# Patient Record
Sex: Female | Born: 1950 | Race: White | Hispanic: No | Marital: Married | Smoking: Former smoker
Health system: Southern US, Community
[De-identification: ages and names within clinical notes are randomized; demographics above are authoritative.]

## PROBLEM LIST (undated history)

## (undated) DIAGNOSIS — E079 Disorder of thyroid, unspecified: Secondary | ICD-10-CM

## (undated) DIAGNOSIS — Z86010 Personal history of colonic polyps: Secondary | ICD-10-CM

## (undated) DIAGNOSIS — Z9989 Dependence on other enabling machines and devices: Secondary | ICD-10-CM

## (undated) DIAGNOSIS — M1990 Unspecified osteoarthritis, unspecified site: Secondary | ICD-10-CM

## (undated) DIAGNOSIS — I10 Essential (primary) hypertension: Secondary | ICD-10-CM

## (undated) DIAGNOSIS — C801 Malignant (primary) neoplasm, unspecified: Secondary | ICD-10-CM

## (undated) DIAGNOSIS — S0340XA Sprain of jaw, unspecified side, initial encounter: Secondary | ICD-10-CM

## (undated) DIAGNOSIS — G4733 Obstructive sleep apnea (adult) (pediatric): Secondary | ICD-10-CM

## (undated) DIAGNOSIS — Z923 Personal history of irradiation: Secondary | ICD-10-CM

## (undated) DIAGNOSIS — K209 Esophagitis, unspecified without bleeding: Secondary | ICD-10-CM

## (undated) DIAGNOSIS — G4730 Sleep apnea, unspecified: Secondary | ICD-10-CM

## (undated) DIAGNOSIS — K449 Diaphragmatic hernia without obstruction or gangrene: Secondary | ICD-10-CM

## (undated) DIAGNOSIS — E039 Hypothyroidism, unspecified: Secondary | ICD-10-CM

## (undated) HISTORY — DX: Obstructive sleep apnea (adult) (pediatric): G47.33

## (undated) HISTORY — PX: COLONOSCOPY: SHX174

## (undated) HISTORY — PX: ERCP: SHX60

## (undated) HISTORY — DX: Personal history of colonic polyps: Z86.010

## (undated) HISTORY — PX: THYROID SURGERY: SHX805

## (undated) HISTORY — PX: ABDOMINAL HYSTERECTOMY: SHX81

## (undated) HISTORY — PX: CHOLECYSTECTOMY: SHX55

## (undated) HISTORY — PX: TONSILECTOMY, ADENOIDECTOMY, BILATERAL MYRINGOTOMY AND TUBES: SHX2538

## (undated) HISTORY — DX: Diaphragmatic hernia without obstruction or gangrene: K44.9

## (undated) HISTORY — DX: Unspecified osteoarthritis, unspecified site: M19.90

## (undated) HISTORY — PX: ESOPHAGOGASTRODUODENOSCOPY: SHX1529

## (undated) HISTORY — DX: Esophagitis, unspecified: K20.9

## (undated) HISTORY — DX: Sleep apnea, unspecified: G47.30

## (undated) HISTORY — DX: Dependence on other enabling machines and devices: Z99.89

## (undated) HISTORY — DX: Sprain of jaw, unspecified side, initial encounter: S03.40XA

## (undated) HISTORY — DX: Esophagitis, unspecified without bleeding: K20.90

## (undated) HISTORY — PX: BACK SURGERY: SHX140

## (undated) HISTORY — DX: Disorder of thyroid, unspecified: E07.9

---

## 1998-03-24 ENCOUNTER — Ambulatory Visit: Admission: RE | Admit: 1998-03-24 | Discharge: 1998-03-24 | Payer: Self-pay | Admitting: Otolaryngology

## 1998-04-22 ENCOUNTER — Encounter: Payer: Self-pay | Admitting: Otolaryngology

## 1998-04-24 ENCOUNTER — Observation Stay (HOSPITAL_COMMUNITY): Admission: RE | Admit: 1998-04-24 | Discharge: 1998-04-25 | Payer: Self-pay | Admitting: Otolaryngology

## 2001-12-06 ENCOUNTER — Other Ambulatory Visit: Admission: RE | Admit: 2001-12-06 | Discharge: 2001-12-06 | Payer: Self-pay | Admitting: Obstetrics and Gynecology

## 2003-02-08 ENCOUNTER — Inpatient Hospital Stay (HOSPITAL_COMMUNITY): Admission: RE | Admit: 2003-02-08 | Discharge: 2003-02-12 | Payer: Self-pay | Admitting: Orthopaedic Surgery

## 2003-04-25 ENCOUNTER — Other Ambulatory Visit: Admission: RE | Admit: 2003-04-25 | Discharge: 2003-04-25 | Payer: Self-pay | Admitting: Obstetrics and Gynecology

## 2004-11-03 ENCOUNTER — Encounter: Admission: RE | Admit: 2004-11-03 | Discharge: 2004-11-03 | Payer: Self-pay | Admitting: Family Medicine

## 2005-09-17 ENCOUNTER — Ambulatory Visit: Payer: Self-pay | Admitting: Family Medicine

## 2005-09-18 ENCOUNTER — Inpatient Hospital Stay (HOSPITAL_COMMUNITY): Admission: EM | Admit: 2005-09-18 | Discharge: 2005-09-27 | Payer: Self-pay | Admitting: Emergency Medicine

## 2005-09-22 ENCOUNTER — Ambulatory Visit: Payer: Self-pay | Admitting: Gastroenterology

## 2005-09-26 ENCOUNTER — Encounter (INDEPENDENT_AMBULATORY_CARE_PROVIDER_SITE_OTHER): Payer: Self-pay | Admitting: Specialist

## 2005-09-26 ENCOUNTER — Ambulatory Visit: Payer: Self-pay | Admitting: Internal Medicine

## 2005-10-02 ENCOUNTER — Ambulatory Visit: Payer: Self-pay | Admitting: Family Medicine

## 2005-10-21 ENCOUNTER — Ambulatory Visit: Payer: Self-pay | Admitting: Family Medicine

## 2005-12-29 ENCOUNTER — Encounter: Admission: RE | Admit: 2005-12-29 | Discharge: 2005-12-29 | Payer: Self-pay | Admitting: Orthopaedic Surgery

## 2006-02-15 ENCOUNTER — Ambulatory Visit: Payer: Self-pay | Admitting: Family Medicine

## 2006-05-05 ENCOUNTER — Ambulatory Visit: Payer: Self-pay | Admitting: Family Medicine

## 2006-05-20 ENCOUNTER — Ambulatory Visit: Payer: Self-pay | Admitting: Family Medicine

## 2006-07-08 ENCOUNTER — Ambulatory Visit: Payer: Self-pay | Admitting: Family Medicine

## 2006-10-21 ENCOUNTER — Ambulatory Visit: Payer: Self-pay | Admitting: Family Medicine

## 2006-12-07 ENCOUNTER — Ambulatory Visit: Payer: Self-pay | Admitting: Family Medicine

## 2006-12-30 ENCOUNTER — Ambulatory Visit: Payer: Self-pay | Admitting: Family Medicine

## 2007-03-01 ENCOUNTER — Ambulatory Visit: Payer: Self-pay | Admitting: Family Medicine

## 2007-05-23 ENCOUNTER — Ambulatory Visit: Payer: Self-pay | Admitting: Family Medicine

## 2007-07-14 ENCOUNTER — Ambulatory Visit: Payer: Self-pay | Admitting: Family Medicine

## 2007-11-01 ENCOUNTER — Ambulatory Visit: Payer: Self-pay | Admitting: Family Medicine

## 2008-03-14 ENCOUNTER — Ambulatory Visit: Payer: Self-pay | Admitting: Family Medicine

## 2008-03-21 ENCOUNTER — Ambulatory Visit: Payer: Self-pay | Admitting: Internal Medicine

## 2008-04-25 ENCOUNTER — Ambulatory Visit: Payer: Self-pay | Admitting: Family Medicine

## 2008-07-10 ENCOUNTER — Ambulatory Visit: Payer: Self-pay | Admitting: Family Medicine

## 2008-11-05 ENCOUNTER — Ambulatory Visit: Payer: Self-pay | Admitting: Family Medicine

## 2009-03-25 ENCOUNTER — Ambulatory Visit: Payer: Self-pay | Admitting: Obstetrics and Gynecology

## 2009-04-29 ENCOUNTER — Ambulatory Visit: Payer: Self-pay | Admitting: Family Medicine

## 2009-06-19 ENCOUNTER — Encounter: Admission: RE | Admit: 2009-06-19 | Discharge: 2009-06-19 | Payer: Self-pay

## 2009-08-29 ENCOUNTER — Ambulatory Visit: Payer: Self-pay | Admitting: Physician Assistant

## 2009-10-03 ENCOUNTER — Ambulatory Visit (HOSPITAL_BASED_OUTPATIENT_CLINIC_OR_DEPARTMENT_OTHER): Admission: RE | Admit: 2009-10-03 | Discharge: 2009-10-03 | Payer: Self-pay | Admitting: Orthopedic Surgery

## 2010-01-02 ENCOUNTER — Ambulatory Visit: Payer: Self-pay | Admitting: Family Medicine

## 2010-01-22 ENCOUNTER — Encounter: Payer: Self-pay | Admitting: Family Medicine

## 2010-01-22 LAB — CONVERTED CEMR LAB: Pap Smear: NORMAL

## 2010-01-27 ENCOUNTER — Encounter: Admission: RE | Admit: 2010-01-27 | Discharge: 2010-01-27 | Payer: Self-pay | Admitting: Orthopaedic Surgery

## 2010-06-04 ENCOUNTER — Ambulatory Visit (INDEPENDENT_AMBULATORY_CARE_PROVIDER_SITE_OTHER): Payer: Managed Care, Other (non HMO) | Admitting: Family Medicine

## 2010-06-04 ENCOUNTER — Encounter: Payer: Self-pay | Admitting: Family Medicine

## 2010-06-04 ENCOUNTER — Other Ambulatory Visit: Payer: Self-pay | Admitting: Family Medicine

## 2010-06-04 DIAGNOSIS — M1990 Unspecified osteoarthritis, unspecified site: Secondary | ICD-10-CM

## 2010-06-04 DIAGNOSIS — E1169 Type 2 diabetes mellitus with other specified complication: Secondary | ICD-10-CM | POA: Insufficient documentation

## 2010-06-04 DIAGNOSIS — J0140 Acute pansinusitis, unspecified: Secondary | ICD-10-CM | POA: Insufficient documentation

## 2010-06-04 DIAGNOSIS — E039 Hypothyroidism, unspecified: Secondary | ICD-10-CM | POA: Insufficient documentation

## 2010-06-04 DIAGNOSIS — E785 Hyperlipidemia, unspecified: Secondary | ICD-10-CM

## 2010-06-04 DIAGNOSIS — E119 Type 2 diabetes mellitus without complications: Secondary | ICD-10-CM | POA: Insufficient documentation

## 2010-06-04 DIAGNOSIS — E1165 Type 2 diabetes mellitus with hyperglycemia: Secondary | ICD-10-CM | POA: Insufficient documentation

## 2010-06-04 DIAGNOSIS — J019 Acute sinusitis, unspecified: Secondary | ICD-10-CM

## 2010-06-04 LAB — CBC WITH DIFFERENTIAL/PLATELET
Basophils Absolute: 0 10*3/uL (ref 0.0–0.1)
Eosinophils Absolute: 0.2 10*3/uL (ref 0.0–0.7)
HCT: 41.2 % (ref 36.0–46.0)
Hemoglobin: 14.2 g/dL (ref 12.0–15.0)
Lymphs Abs: 1.9 10*3/uL (ref 0.7–4.0)
MCHC: 34.4 g/dL (ref 30.0–36.0)
MCV: 93.4 fl (ref 78.0–100.0)
Neutro Abs: 5.1 10*3/uL (ref 1.4–7.7)
Neutrophils Relative %: 65.7 % (ref 43.0–77.0)
RBC: 4.41 Mil/uL (ref 3.87–5.11)
RDW: 14.1 % (ref 11.5–14.6)
WBC: 7.7 10*3/uL (ref 4.5–10.5)

## 2010-06-04 LAB — BASIC METABOLIC PANEL
BUN: 18 mg/dL (ref 6–23)
CO2: 32 mEq/L (ref 19–32)
Chloride: 101 mEq/L (ref 96–112)
Creatinine, Ser: 0.7 mg/dL (ref 0.4–1.2)
GFR: 97.12 mL/min (ref >60.00)
Glucose, Bld: 146 mg/dL — ABNORMAL HIGH (ref 70–99)
Potassium: 4.5 mEq/L (ref 3.5–5.1)
Sodium: 139 mEq/L (ref 135–145)

## 2010-06-04 LAB — LIPID PANEL
Cholesterol: 260 mg/dL — ABNORMAL HIGH (ref 0–200)
HDL: 56.5 mg/dL (ref >39.00)
Total CHOL/HDL Ratio: 5
Triglycerides: 127 mg/dL (ref 0.0–149.0)
VLDL: 25.4 mg/dL (ref 0.0–40.0)

## 2010-06-04 LAB — CONVERTED CEMR LAB
Bilirubin Urine: NEGATIVE
Blood in Urine, dipstick: NEGATIVE
Glucose, Urine, Semiquant: NEGATIVE
Ketones, urine, test strip: NEGATIVE
Nitrite: NEGATIVE
Protein, U semiquant: NEGATIVE
Specific Gravity, Urine: 1.025
WBC Urine, dipstick: NEGATIVE
pH: 5

## 2010-06-04 LAB — TSH: TSH: 91.26 u[IU]/mL — ABNORMAL HIGH (ref 0.35–5.50)

## 2010-06-04 LAB — MICROALBUMIN / CREATININE URINE RATIO
Creatinine,U: 178.7 mg/dL
Microalb, Ur: 1.1 mg/dL (ref 0.0–1.9)

## 2010-06-04 LAB — HEPATIC FUNCTION PANEL
AST: 22 U/L (ref 0–37)
Albumin: 4 g/dL (ref 3.5–5.2)
Alkaline Phosphatase: 91 U/L (ref 39–117)
Bilirubin, Direct: 0.1 mg/dL (ref 0.0–0.3)
Total Bilirubin: 0.6 mg/dL (ref 0.3–1.2)
Total Protein: 7.6 g/dL (ref 6.0–8.3)

## 2010-06-04 LAB — HEMOGLOBIN A1C: Hgb A1c MFr Bld: 8.4 % — ABNORMAL HIGH (ref 4.6–6.5)

## 2010-06-04 LAB — LDL CHOLESTEROL, DIRECT: Direct LDL: 188.4 mg/dL

## 2010-06-12 NOTE — Assessment & Plan Note (Signed)
Summary: new pt to estab---ns fee---did see Dr Ruthine Dose  [facility])///sph   Vital Signs:  Patient profile:   60 year old female Menstrual status:  hysterectomy Height:      59.75 inches Weight:      130.8 pounds BMI:     25.85 Pulse rate:   80 / minute Pulse rhythm:   regular BP sitting:   122 / 78  (left arm) Cuff size:   regular  Vitals Entered By: Almeta Monas CMA Duncan Dull) (June 04, 2010 8:25 AM) CC: New Est Care-- Fasting/wants to have labs done, URI symptoms     Menstrual Status hysterectomy Last PAP Result normal   History of Present Illness: Pt is here to establish  Type 1 diabetes mellitus follow-up      This is a 60 year old woman who presents with Type 2 diabetes mellitus follow-up.  The problem began >1 year ago.  Pt was dx with diabetes about 8 years ago.  The patient denies polyuria, polydipsia, blurred vision, self managed hypoglycemia, hypoglycemia requiring help, weight loss, weight gain, and numbness of extremities.  The patient denies the following symptoms: neuropathic pain, chest pain, vomiting, orthostatic symptoms, poor wound healing, intermittent claudication, vision loss, and foot ulcer.  Since the last visit the patient reports good dietary compliance, compliance with medications, exercising regularly, and monitoring blood glucose.  The patient has been measuring capillary blood glucose before breakfast.  Since the last visit, the patient reports having had eye care by an ophthalmologist and no foot care.    Hyperlipidemia follow-up      The patient also presents for Hyperlipidemia follow-up.  The patient denies muscle aches, GI upset, abdominal pain, flushing, itching, constipation, diarrhea, and fatigue.  The patient denies the following symptoms: chest pain/pressure, exercise intolerance, dypsnea, palpitations, syncope, and pedal edema.  Compliance with medications (by patient report) has been poor.  Dietary compliance has been good.  The patient reports  exercising 3-4X per week.  Adjunctive measures currently used by the patient include fish oil supplements.         The patient also presents with URI symptoms.  The symptoms began 2 weeks ago.  The patient complains of nasal congestion, purulent nasal discharge, dry cough, earache, and sick contacts.  The patient denies fever, low-grade fever (<100.5 degrees), fever of 100.5-103 degrees, fever of 103.1-104 degrees, fever to >104 degrees, stiff neck, dyspnea, wheezing, rash, vomiting, diarrhea, use of an antipyretic, and response to antipyretic.  The patient also reports headache.  The patient denies the following risk factors for Strep sinusitis: unilateral facial pain, unilateral nasal discharge, poor response to decongestant, double sickening, tooth pain, Strep exposure, tender adenopathy, and absence of  cough.  No OTC meds.    Preventive Screening-Counseling & Management  Alcohol-Tobacco     Smoking Status: never  Caffeine-Diet-Exercise     Does Patient Exercise: yes     Type of exercise: walking     Times/week: 5      Drug Use:  no.    Current Medications (verified): 1)  Glimepiride 2 Mg Tabs (Glimepiride) .Marland Kitchen.. 1 By Mouth Once Daily 2)  Vicodin Es 7.5-750 Mg Tabs (Hydrocodone-Acetaminophen) .Marland Kitchen.. 1 By Mouth Every 6-8 As Needed For Pain 3)  Celebrex 200 Mg Caps (Celecoxib) .Marland Kitchen.. 1 By Mouth Once Daily With Food 4)  Prozac 20 Mg Caps (Fluoxetine Hcl) .Marland Kitchen.. 1 By Mouth Once Daily 5)  Synthroid 125 Mcg Tabs (Levothyroxine Sodium) .Marland Kitchen.. 1 By Mouth Every Other Day 6)  Synthroid 112 Mcg Tabs (Levothyroxine Sodium) .Marland Kitchen.. 1 By Mouth Every Other Day 7)  Hydrochlorothiazide 25 Mg Tabs (Hydrochlorothiazide) .Marland Kitchen.. 1 By Mouth Once Daily 8)  Lisinopril 5 Mg Tabs (Lisinopril) .Marland Kitchen.. 1 By Mouth Once Daily 9)  Crestor 5 Mg Tabs (Rosuvastatin Calcium) .Marland Kitchen.. 1 By Mouth Every Other Day 10)  Ceftin 500 Mg Tabs (Cefuroxime Axetil) .Marland Kitchen.. 1 By Mouth Two Times A Day 11)  Flonase 50 Mcg/act Susp (Fluticasone Propionate)  .... 2 Sprays Each Nostril Once Daily  Allergies (verified): No Known Drug Allergies  Past History:  Family History: Last updated: 06/04/2010 Family History of Alcoholism/Addiction Family History of Stroke F 1st degree relative <60--Mother and MGM Family History of Cardiovascular disorder--MGF Family History Breast cancer 1st degree relative <50--cousin  Social History: Last updated: 06/04/2010 Occupation: [employer] Married Never Smoked Alcohol use-no Drug use-no Regular exercise-yes  Risk Factors: Exercise: yes (06/04/2010)  Risk Factors: Smoking Status: never (06/04/2010)  Past Medical History: Diabetes mellitus, type II Hyperlipidemia Hypothyroidism Osteoarthritis  Past Surgical History: Thyroidectomy Back Surgery x3 Cholecystectomy Hysterectomy Tonsillectomy  Family History: Reviewed history and no changes required. Family History of Alcoholism/Addiction Family History of Stroke F 1st degree relative <60--Mother and MGM Family History of Cardiovascular disorder--MGF Family History Breast cancer 1st degree relative <50--cousin  Social History: Reviewed history and no changes required. 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 Eyes:  Denies blurring, discharge, double vision, eye irritation, eye pain, halos, itching, light sensitivity, red eye, vision loss-1 eye, and vision loss-both eyes. ENT:  Complains of nasal congestion; denies decreased hearing, difficulty swallowing, ear discharge, earache, hoarseness, nosebleeds, postnasal drainage, ringing in ears, sinus pressure, and sore throat. CV:  Denies bluish discoloration of lips or nails, chest pain  or discomfort, difficulty breathing at night, difficulty breathing while lying down, fainting, fatigue, leg cramps with exertion, lightheadness, near fainting, palpitations, shortness of breath with exertion, swelling of feet, swelling of hands, and weight gain. Resp:  Denies chest discomfort, chest pain with inspiration, cough, coughing up blood, excessive snoring, hypersomnolence, morning headaches, pleuritic, shortness of breath, sputum productive, and wheezing. GI:  Denies abdominal pain, bloody stools, change in bowel habits, constipation, dark tarry stools, diarrhea, excessive appetite, gas, hemorrhoids, indigestion, loss of appetite, nausea, vomiting, vomiting blood, and yellowish skin color. GU:  Denies abnormal vaginal bleeding, decreased libido, discharge, dysuria, genital sores, hematuria, incontinence, nocturia, urinary frequency, and urinary hesitancy. MS:  Denies joint pain, joint redness, joint swelling, loss of strength, low back pain, mid back pain, muscle aches, muscle , cramps, muscle weakness, stiffness, and thoracic pain. Derm:  Denies changes in color of skin, changes in nail beds, dryness, excessive perspiration, flushing, hair loss, insect bite(s), itching, lesion(s), poor wound healing, and rash. Neuro:  Denies brief paralysis, difficulty with concentration, disturbances in coordination, falling down, headaches, inability to speak, memory loss, numbness, poor balance, seizures, sensation of room spinning, tingling, tremors, visual disturbances, and weakness. Psych:  Denies alternate hallucination ( auditory/visual), anxiety, depression, easily angered, easily tearful, irritability, mental problems, panic attacks, sense of great danger, suicidal thoughts/plans, thoughts of violence, unusual visions or sounds, and thoughts /plans of harming others. Endo:  Denies cold intolerance, excessive hunger, excessive thirst, excessive urination, heat  intolerance, polyuria, and weight  change. Heme:  Denies abnormal bruising, bleeding, enlarge lymph nodes, fevers, pallor, and skin discoloration. Allergy:  Denies hives or rash, itching eyes, persistent infections, seasonal allergies, and sneezing.  Physical Exam  General:  Well-developed,well-nourished,in no acute distress; alert,appropriate and cooperative throughout examination Ears:  External ear exam shows no significant lesions or deformities.  Otoscopic examination reveals clear canals, tympanic membranes are intact bilaterally without bulging, retraction, inflammation or discharge. Hearing is grossly normal bilaterally. Nose:  no external deformity, mucosal erythema, mucosal edema, L frontal sinus tenderness, L maxillary sinus tenderness, R frontal sinus tenderness, and R maxillary sinus tenderness.   Mouth:  Oral mucosa and oropharynx without lesions or exudates.  Teeth in good repair. Neck:  No deformities, masses, or tenderness noted. Lungs:  Normal respiratory effort, chest expands symmetrically. Lungs are clear to auscultation, no crackles or wheezes. Heart:  normal rate and no murmur.   Abdomen:  Bowel sounds positive,abdomen soft and non-tender without masses, organomegaly or hernias noted. Extremities:  No clubbing, cyanosis, edema, or deformity noted with normal full range of motion of all joints.   Psych:  Oriented X3 and normally interactive.    Diabetes Management Exam:    Foot Exam (with socks and/or shoes not present):       Sensory-Pinprick/Light touch:          Left medial foot (L-4): normal          Left dorsal foot (L-5): normal          Left lateral foot (S-1): normal          Right medial foot (L-4): normal          Right dorsal foot (L-5): normal          Right lateral foot (S-1): normal       Sensory-Monofilament:          Left foot: normal          Right foot: normal       Inspection:          Left foot: normal          Right foot: normal       Nails:          Left foot: normal           Right foot: thickened    Eye Exam:       Eye Exam done elsewhere          Date: 06/05/2009          Results: normal          Done by: Hyacinth Meeker   Impression & Recommendations:  Problem # 1:  HYPOTHYROIDISM (ICD-244.9)  Her updated medication list for this problem includes:    Synthroid 125 Mcg Tabs (Levothyroxine sodium) .Marland Kitchen... 1 by mouth every other day    Synthroid 112 Mcg Tabs (Levothyroxine sodium) .Marland Kitchen... 1 by mouth every other day  Orders: Venipuncture (69629) TLB-Lipid Panel (80061-LIPID) TLB-BMP (Basic Metabolic Panel-BMET) (80048-METABOL) TLB-CBC Platelet - w/Differential (85025-CBCD) TLB-Hepatic/Liver Function Pnl (80076-HEPATIC) TLB-TSH (Thyroid Stimulating Hormone) (84443-TSH) TLB-A1C / Hgb A1C (Glycohemoglobin) (83036-A1C) Specimen Handling (52841) UA Dipstick w/Micro (automated) (81001)  Problem # 2:  HYPERLIPIDEMIA (ICD-272.4)  Her updated medication list for this problem includes:    Crestor 5 Mg Tabs (Rosuvastatin calcium) .Marland Kitchen... 1 by mouth every other day  Orders: Venipuncture (32440) TLB-Lipid Panel (80061-LIPID) TLB-BMP (Basic Metabolic Panel-BMET) (80048-METABOL) TLB-CBC Platelet - w/Differential (85025-CBCD) TLB-Hepatic/Liver Function Pnl (80076-HEPATIC) TLB-TSH (Thyroid  Stimulating Hormone) (84443-TSH) TLB-A1C / Hgb A1C (Glycohemoglobin) (83036-A1C) Specimen Handling (25366)  Problem # 3:  DIABETES MELLITUS, TYPE II (ICD-250.00)  Her updated medication list for this problem includes:    Glimepiride 2 Mg Tabs (Glimepiride) .Marland Kitchen... 1 by mouth once daily    Lisinopril 5 Mg Tabs (Lisinopril) .Marland Kitchen... 1 by mouth once daily  Orders: Venipuncture (44034) TLB-Lipid Panel (80061-LIPID) TLB-BMP (Basic Metabolic Panel-BMET) (80048-METABOL) TLB-CBC Platelet - w/Differential (85025-CBCD) TLB-Hepatic/Liver Function Pnl (80076-HEPATIC) TLB-TSH (Thyroid Stimulating Hormone) (84443-TSH) TLB-A1C / Hgb A1C (Glycohemoglobin) (83036-A1C) TLB-Microalbumin/Creat Ratio,  Urine (82043-MALB) Specimen Handling (74259) UA Dipstick w/Micro (automated) (81001)  Last Eye Exam: normal (06/05/2009)  Problem # 4:  OSTEOARTHRITIS (ICD-715.90)  Her updated medication list for this problem includes:    Vicodin Es 7.5-750 Mg Tabs (Hydrocodone-acetaminophen) .Marland Kitchen... 1 by mouth every 6-8 as needed for pain    Celebrex 200 Mg Caps (Celecoxib) .Marland Kitchen... 1 by mouth once daily with food  Discussed use of medications, application of heat or cold, and exercises.   Problem # 5:  SINUSITIS - ACUTE-NOS (ICD-461.9)  Her updated medication list for this problem includes:    Ceftin 500 Mg Tabs (Cefuroxime axetil) .Marland Kitchen... 1 by mouth two times a day    Flonase 50 Mcg/act Susp (Fluticasone propionate) .Marland Kitchen... 2 sprays each nostril once daily  Instructed on treatment. Call if symptoms persist or worsen.   Complete Medication List: 1)  Glimepiride 2 Mg Tabs (Glimepiride) .Marland Kitchen.. 1 by mouth once daily 2)  Vicodin Es 7.5-750 Mg Tabs (Hydrocodone-acetaminophen) .Marland Kitchen.. 1 by mouth every 6-8 as needed for pain 3)  Celebrex 200 Mg Caps (Celecoxib) .Marland Kitchen.. 1 by mouth once daily with food 4)  Prozac 20 Mg Caps (Fluoxetine hcl) .Marland Kitchen.. 1 by mouth once daily 5)  Synthroid 125 Mcg Tabs (Levothyroxine sodium) .Marland Kitchen.. 1 by mouth every other day 6)  Synthroid 112 Mcg Tabs (Levothyroxine sodium) .Marland Kitchen.. 1 by mouth every other day 7)  Hydrochlorothiazide 25 Mg Tabs (Hydrochlorothiazide) .Marland Kitchen.. 1 by mouth once daily 8)  Lisinopril 5 Mg Tabs (Lisinopril) .Marland Kitchen.. 1 by mouth once daily 9)  Crestor 5 Mg Tabs (Rosuvastatin calcium) .Marland Kitchen.. 1 by mouth every other day 10)  Ceftin 500 Mg Tabs (Cefuroxime axetil) .Marland Kitchen.. 1 by mouth two times a day 11)  Flonase 50 Mcg/act Susp (Fluticasone propionate) .... 2 sprays each nostril once daily Prescriptions: CRESTOR 5 MG TABS (ROSUVASTATIN CALCIUM) 1 by mouth every other day  #15 x 2   Entered and Authorized by:   Loreen Freud DO   Signed by:   Loreen Freud DO on 06/04/2010   Method used:   Print  then Give to Patient   RxID:   [id number] CRESTOR 5 MG TABS (ROSUVASTATIN CALCIUM) 1 by mouth every other day  #15 x 2   Entered and Authorized by:   Loreen Freud DO   Signed by:   Loreen Freud DO on 06/04/2010   Method used:   Print then Give to Patient   RxID:   [id number] FLONASE 50 MCG/ACT SUSP (FLUTICASONE PROPIONATE) 2 sprays each nostril once daily  #1 x 1   Entered and Authorized by:   Loreen Freud DO   Signed by:   Loreen Freud DO on 06/04/2010   Method used:   Electronically to        [facility] (retail)       [address]       [place], [place]  [address]  Ph: [phone]       Fax: [phone]   RxID:   [id number] CEFTIN 500 MG TABS (CEFUROXIME AXETIL) 1 by mouth two times a day  #20 x 0   Entered and Authorized by:   Loreen Freud DO   Signed by:   Loreen Freud DO on 06/04/2010   Method used:   Electronically to        [facility] (retail)       [address]       [place], [place]  [address]       Ph: [phone]       Fax: [phone]   RxID:   [id number] CRESTOR 5 MG TABS (ROSUVASTATIN CALCIUM) 1 by mouth every other day  #15 x 0   Entered and Authorized by:   Loreen Freud DO   Signed by:   Loreen Freud DO on 06/04/2010   Method used:   Historical   RxID:   [id number] SYNTHROID 112 MCG TABS (LEVOTHYROXINE SODIUM) 1 by mouth every other day  #90 x 3   Entered and Authorized by:   Loreen Freud DO   Signed by:   Loreen Freud DO on 06/04/2010   Method used:   Electronically to        [facility] (retail)       [address]       [place], [place]  [address]       Ph: [phone]       Fax: [phone]   RxID:   [id number] HYDROCHLOROTHIAZIDE 25 MG TABS (HYDROCHLOROTHIAZIDE) 1 by mouth once daily  #90 x 3   Entered and Authorized by:   Loreen Freud DO   Signed by:   Loreen Freud DO on 06/04/2010   Method  used:   Electronically to        [facility] (retail)       [address]       [place], [place]  [address]       Ph: [phone]       Fax: [phone]   RxID:   [id number] SYNTHROID 125 MCG TABS (LEVOTHYROXINE SODIUM) 1 by mouth every other day  #90 x 3   Entered and Authorized by:   Loreen Freud DO   Signed by:   Loreen Freud DO on 06/04/2010   Method used:   Electronically to        [facility] (retail)       [address]       [place], [place]  [address]       Ph: [phone]       Fax: [phone]   RxID:   [id number] PROZAC 20 MG CAPS (FLUOXETINE HCL) 1 by mouth once daily  #90 x 3   Entered and Authorized by:   Loreen Freud DO   Signed by:   Loreen Freud DO on 06/04/2010   Method used:   Electronically to        [facility] (retail)       [address]       [place], [place]  [address]       Ph: [phone]       Fax: [phone]   RxID:  [id number] GLIMEPIRIDE 2 MG TABS (GLIMEPIRIDE) 1 by mouth once daily  #90 x 3   Entered and Authorized by:   Loreen Freud DO   Signed by:   Loreen Freud DO on 06/04/2010   Method used:   Electronically to        [facility] (retail)       [address]       [place], [place]  [address]       Ph: [phone]       Fax: [phone]   RxID:   [id number] LISINOPRIL 5 MG TABS (LISINOPRIL) 1 by mouth once daily  #30 x 2   Entered and Authorized by:   Loreen Freud DO   Signed by:   Loreen Freud DO on 06/04/2010   Method used:   Electronically to        [facility] (retail)       [address]       [place], [place]  [address]       Ph: [phone]       Fax: [phone]   RxID:   [id number] SYNTHROID 125 MCG TABS (LEVOTHYROXINE SODIUM) 1 by mouth every other day  #30 x 2   Entered and Authorized by:   Loreen Freud DO   Signed by:   Loreen Freud DO on 06/04/2010   Method used:   Electronically to        [id number] (retail)       [address]       [place], [place]  [address]       Ph: [phone] or [phone]       Fax: [phone]   RxID:   [id number]    Orders Added: 1)  Venipuncture [38101] 2)  TLB-Lipid Panel [80061-LIPID] 3)  TLB-BMP (Basic Metabolic Panel-BMET) [80048-METABOL] 4)  TLB-CBC Platelet - w/Differential [85025-CBCD] 5)  TLB-Hepatic/Liver Function Pnl [80076-HEPATIC] 6)  TLB-TSH (Thyroid Stimulating Hormone) [84443-TSH] 7)  TLB-A1C / Hgb A1C (Glycohemoglobin) [83036-A1C] 8)  TLB-Microalbumin/Creat Ratio, Urine [82043-MALB] 9)  Specimen Handling [99000] 10)  UA Dipstick w/Micro (automated) [81001] 11)  New Patient Level III [75102]   Immunization History:  Tetanus/Td Immunization History:    Tetanus/Td:  given (01/24/2007)    Tetanus/Td:  historical (01/24/2007)  Pneumovax Immunization History:    Pneumovax:  historical (01/05/2009)   Immunization History:  Tetanus/Td Immunization History:    Tetanus/Td:  Historical (01/24/2007)  Pneumovax Immunization History:    Pneumovax:  Historical (01/05/2009)   Flu Vaccine Result Date:  01/22/2010 Flu Vaccine Result:  given Flu Vaccine Next Due:  1 yr TD Result Date:  01/24/2007 TD Result:  given TD Next Due:  10 yr Colonoscopy Result Date:  08/16/2002 Colonoscopy Result:  normal Colonoscopy Next Due:  10 yr PAP Result Date:  01/22/2010 PAP Result:  normal PAP Next Due:  1 yr Mammogram Result Date:  03/20/2009 Mammogram Result:  normal Mammogram Next Due:  1 yr     Laboratory Results   Urine Tests   Date/Time Reported: June 04, 2010 11:58 AM   Routine Urinalysis   Color: yellow Appearance: Clear Glucose: negative   (Normal Range: Negative) Bilirubin: negative   (Normal Range: Negative) Ketone: negative   (Normal Range: Negative) Spec.  Gravity: 1.025   (Normal Range: 1.003-1.035) Blood: negative   (  Normal Range: Negative) pH: 5.0   (Normal Range: 5.0-8.0) Protein: negative   (Normal Range: Negative) Urobilinogen: negative   (Normal Range: 0-1) Nitrite: negative   (Normal Range: Negative) Leukocyte Esterace: negative   (Normal Range: Negative)    Comments: Floydene Flock  June 04, 2010 11:58 AM

## 2010-06-22 LAB — GLUCOSE, CAPILLARY
Glucose-Capillary: 156 mg/dL — ABNORMAL HIGH (ref 70–99)
Glucose-Capillary: 190 mg/dL — ABNORMAL HIGH (ref 70–99)

## 2010-06-22 LAB — BASIC METABOLIC PANEL
BUN: 12 mg/dL (ref 6–23)
CO2: 30 mEq/L (ref 19–32)
Calcium: 9.1 mg/dL (ref 8.4–10.5)
Chloride: 98 mEq/L (ref 96–112)
Creatinine, Ser: 0.65 mg/dL (ref 0.4–1.2)
GFR calc Af Amer: >60 mL/min (ref >60)
GFR calc non Af Amer: >60 mL/min (ref >60)
Glucose, Bld: 316 mg/dL — ABNORMAL HIGH (ref 70–99)
Potassium: 4.8 mEq/L (ref 3.5–5.1)
Sodium: 137 mEq/L (ref 135–145)

## 2010-06-22 LAB — POCT HEMOGLOBIN-HEMACUE: Hemoglobin: 12.6 g/dL (ref 12.0–15.0)

## 2010-06-23 ENCOUNTER — Ambulatory Visit (INDEPENDENT_AMBULATORY_CARE_PROVIDER_SITE_OTHER): Payer: Managed Care, Other (non HMO) | Admitting: Endocrinology

## 2010-06-23 ENCOUNTER — Encounter: Payer: Self-pay | Admitting: Endocrinology

## 2010-06-23 ENCOUNTER — Other Ambulatory Visit: Payer: Managed Care, Other (non HMO)

## 2010-06-23 ENCOUNTER — Other Ambulatory Visit: Payer: Self-pay | Admitting: Endocrinology

## 2010-06-23 DIAGNOSIS — E039 Hypothyroidism, unspecified: Secondary | ICD-10-CM

## 2010-06-23 LAB — TSH: TSH: 2.68 u[IU]/mL (ref 0.35–5.50)

## 2010-07-03 NOTE — Assessment & Plan Note (Signed)
Summary: NEW ENDO [employer] HYPOTHY PT-ALICIA/DR LOWNE-PKG-#--STC   Vital Signs:  Patient profile:   60 year old female Menstrual status:  hysterectomy Height:      59.75 inches (151.77 cm) Weight:      130 pounds (59.09 kg) BMI:     25.69 O2 Sat:      97 % on Room air Temp:     98.1 degrees F (36.72 degrees C) oral Pulse rate:   89 / minute Pulse rhythm:   regular BP sitting:   118 / 78  (left arm) Cuff size:   regular  Vitals Entered By: Brenton Grills CMA Duncan Dull) (June 23, 2010 9:45 AM)  O2 Flow:  Room air CC: New Endo Consult/Hypothyroidism/Dr. Lowne/aj Is Patient Diabetic? Yes   Referring Provider:  Loreen Freud DO Primary Provider:  Laury Axon   CC:  New Endo Consult/Hypothyroidism/Dr. Lowne/aj.  History of Present Illness: at age 67, pt had resection of a goiter (benign).  since then, she has been on synthroid.  she says she felt well on synthroid 150 or 175 micrograms/day.  it was reduced 1 year ago, due to an abnormal blood test.  she says she takes synthroid exactly as prescribed.  she now says few mos of moderate bilat shoulder pain, and assoc fatigue.    Current Medications (verified): 1)  Glimepiride 2 Mg Tabs (Glimepiride) .Marland Kitchen.. 1 By Mouth Once Daily 2)  Vicodin Es 7.5-750 Mg Tabs (Hydrocodone-Acetaminophen) .Marland Kitchen.. 1 By Mouth Every 6-8 As Needed For Pain 3)  Celebrex 200 Mg Caps (Celecoxib) .Marland Kitchen.. 1 By Mouth Once Daily With Food 4)  Prozac 20 Mg Caps (Fluoxetine Hcl) .Marland Kitchen.. 1 By Mouth Once Daily 5)  Synthroid 125 Mcg Tabs (Levothyroxine Sodium) .Marland Kitchen.. 1 By Mouth Every Other Day 6)  Synthroid 112 Mcg Tabs (Levothyroxine Sodium) .Marland Kitchen.. 1 By Mouth Every Other Day 7)  Hydrochlorothiazide 25 Mg Tabs (Hydrochlorothiazide) .Marland Kitchen.. 1 By Mouth Once Daily 8)  Lisinopril 5 Mg Tabs (Lisinopril) .Marland Kitchen.. 1 By Mouth Once Daily 9)  Crestor 5 Mg Tabs (Rosuvastatin Calcium) .Marland Kitchen.. 1 By Mouth Every Other Day 10)  Flonase 50 Mcg/act Susp (Fluticasone Propionate) .... 2 Sprays Each Nostril Once  Daily  Allergies (verified): No Known Drug Allergies  Family History: Reviewed history from 06/04/2010 and no changes required. Family History of Alcoholism/Addiction Family History of Stroke F 1st degree relative <60--Mother and MGM Family History of Cardiovascular disorder--MGF Family History Breast cancer 1st degree relative <50--cousin no thyroid probs  Social History: Reviewed history from 06/04/2010 and no changes required. Occupation: Insurance account manager parts Married Alcohol use-no Drug use-no Regular exercise-yes Former Smoker Smoking Status:  quit  Review of Systems       denies depression, hair loss, cramps, sob, memory loss, constipation, numbness, polyuria, blurry vision, myalgias,  and syncope.  he has lost weight, due to her efforts.  she reports dry skin, rhinorrhea, and easy bruising.    Physical Exam  General:  obese.  no distress  Head:  head: no deformity eyes: no periorbital swelling, no proptosis external nose and ears are normal mouth: no lesion seen Neck:  a healed scar is present.  i do not appreciate a nodule in the thyroid or elsewhere in the neck  Lungs:  Clear to auscultation bilaterally. Normal respiratory effort.  Heart:  Regular rate and rhythm without murmurs or gallops noted. Normal S1,S2.   Msk:  muscle bulk and strength are grossly normal.  no obvious joint swelling.  gait is normal and steady rom of the  shoulders is limited by pain Pulses:  dorsalis pedis intact bilat.   Extremities:  no deformity.   no edema  Neurologic:  cn 2-12 grossly intact.   readily moves all 4's.   sensation is intact to touch on the feet  Skin:  normal texture and temp.  no rash.  not diaphoretic  Cervical Nodes:  No significant adenopathy.  Psych:  Alert and cooperative; normal mood and affect; normal attention span and concentration.   Additional Exam:  FastTSH                   2.68 uIU/mL     Impression & Recommendations:  Problem # 1:  HYPOTHYROIDISM  (ICD-244.9) well-replaced  Problem # 2:  dry skin could be related to #1  Problem # 3:  weight loss not related to #1  Other Orders: TLB-TSH (Thyroid Stimulating Hormone) (84443-TSH) Consultation Level IV (25956)  Patient Instructions: 1)  blood tests are being ordered for you today.  please call [phone] to hear your test results. 2)  pending the test results, please continue the same medications for now. 3)  our goal will be to adjust your medication to normalize your blood test.   4)  (update: i left message on phone-tree:  rx as we discussed)   Orders Added: 1)  TLB-TSH (Thyroid Stimulating Hormone) [84443-TSH] 2)  Consultation Level IV [32951]

## 2010-08-22 NOTE — H&P (Signed)
NAMEARIELLE, [name]                 ACCOUNT NO.:  [id number]   MEDICAL RECORD NO.:  [id number]          PATIENT TYPE:  EMS   LOCATION:  ED                           FACILITY:  [facility]   PHYSICIAN:  Barbette Hair. Arlyce Dice, M.D. [facility] OF BIRTH:  Jan 30, 1951   DATE OF ADMISSION:  09/18/2005  DATE OF DISCHARGE:                                HISTORY & PHYSICAL   CHIEF COMPLAINT:  Right upper quadrant pain.   HISTORY OF PRESENT ILLNESS:  [name] is a pleasant 60 year old white  female who generally does not have GI complaints.  Starting Monday, which is  5 days ago,  the patient developed pain in the right upper quadrant.  She  lived with it for 3 days and then went to Dr. Jola Babinski office yesterday.  He also scheduled her for a CT scan today.  Significant outcomes of this, is  the fact that the abdominal pelvic CT with contrast shows dilation of the  common bile duct to the level of the small bowel suggesting a possible  stricture.  No choledocholithiasis, no intrahepatic biliary obstruction, or  pancreatic, or small-bowel masses were seen.  Her metabolic profile was  unremarkable, in so far, as the LFTs were normal.  However, her glucose was  elevated at 144; she is a diabetic.  When she was seen in the office,  yesterday, Dr. Susann Givens started her on Nexium.  She has only taken 1 dose of  this and her symptoms persist.  After seeing the results of the CT scan Dr.  Jola Babinski office got in contact with Dr. Melvia Heaps for further guidance.  She has been advised; and is now in the emergency room at [facility].  Our plan is to admit her for further study and symptom control.   CURRENT MEDICATIONS:  1.  Avandia 4 mg once daily.  2.  Metoprolol 40 mg once daily.  3.  Synthroid 200 mcg once daily.  4.  Lipitor 20 mg once daily.   ALLERGIES:  No specific allergies, but she has had anxiety reaction to  multiple antihistamines.   PAST MEDICAL HISTORY:  1.  Diabetes mellitus type 2.  2.  Sinusitis.  3.  Hypertension.  4.  Hyperlipidemia.  5.  She has hypothyroidism after having had a thyroidectomy to manage      hyperthyroidism.  6.  Status post surgeries on the low back x3.  7.  She denies any abdominal pain currently.  8.  Status post partial hysterectomy her ovaries are intact.  9.  Status post a screening colonoscopy with no polyps found according to      the patient.  This was in about year 2000 by Dr. Arlyce Dice.   SOCIAL HISTORY:  The patient is married, she works delivering Research scientist (life sciences) parts.  She smokes 1/2-pack-per-day.  She does not consume alcoholic beverages.   REVIEW OF SYSTEMS:  As above, additionally, her weight is stable.  She does  not have reflux symptoms.  No change in bowel habits.  No sweats, but did  have rigors last night, no excessive thirst.  No unusual bleeding or  bruising.  The patient states she does not get chest pain or extremity  edema; and that she had a stress test about 5 years ago that was negative.  This was supervised by Dr. Elease Hashimoto.   LABS:  Hemoglobin A1c is 6.4.  TSH is 2.433.  Sodium 140, potassium 4.4,  chloride 102, CO2 27, BUN 9, creatinine 0.8, glucose 144.  Hemoglobin 14,  hematocrit 44.8, white blood cell count 9.8, platelets 235,000 and MCV is  96.  Amylase 51, lipase 22, total bilirubin 0.5, alkaline phosphatase 100,  AST 14, ALT 15, albumin is 4.3.   PHYSICAL EXAMINATION:  GENERAL:  The patient is a pleasant white female.  She looks a little bit older than her biological age, having had what looks  like a lot of sun exposure and a lifelong of smoking that has weathered her  skin.  VITAL SIGNS:  Blood pressure 122/85, pulse 96, respirations 20, temperature  is 98.6.  She is not in any acute pain, currently.  She is alert and  oriented x3.  HEENT EXAM:  No pallor.  Extraocular movements are intact.  No jaundice.  Mucous membranes are moist and clear.  NECK:  There are no masses, no JVD or bruits.  There is a fine  well-healed  scar at the base of the neck.  ABDOMEN: Notable for tenderness in the right upper quadrant, but no guarding  or rebound with this.  No hepatosplenomegaly, no masses, no bruits.  RECTAL, GENITOURINARY AND BREAST EXAMS:  Deferred.  EXTREMITIES:  No cyanosis, clubbing or edema.  NEUROPSYCHIATRIC:  The patient is alert and oriented x3.  She is pleasant.  She provides a good history.  No tremors.  She is able to move all four  limbs without difficulty; and walks from the triage area into the emergency  room, proper, without assistance   IMPRESSION:  1.  Dilated bile duct associated with right upper quadrant pain, now, for 4-      5 days.  LFTs are normal.  Gallbladder is intact.  There is no evidence      for any gallbladder stones, or pancreatic, or duodenal masses on the CT      scan study.  2.  Diabetes mellitus type 2.  Note, the hemoglobin A1c was slightly      elevated when obtained yesterday.  3.  Sinusitis.  Recent course of Biaxin to treat to a flare of this.   PLAN:  Admit patient for IV fluids, pain control.  Because she is not  throwing up, we are going to let her have solid food for dinner, but she  will be n.p.o. after midnight to rule out for ERCP tomorrow.  Plan to get an  ultrasound of the abdomen, possibly tonight, but it may have to wait until  tomorrow morning.  Plan to control symptoms with analgesics and antiemetics  as needed, and plan to start IV Cipro and Flagyl to cover for possible  cholangitis.      Jennye Moccasin, P.A. [facility]      Robert D. Arlyce Dice, M.D. [facility]  Electronically Signed    SG/MEDQ  D:  09/18/2005  T:  09/18/2005  Job:  [id number]

## 2010-08-22 NOTE — Discharge Summary (Signed)
NAMELASHANN, [name]                 ACCOUNT NO.:  [id number]   MEDICAL RECORD NO.:  [id number]          PATIENT TYPE:  INP   LOCATION:  1511                         FACILITY:  [facility]   PHYSICIAN:  Malcolm T. Russella Dar, M.D. [facility] OF BIRTH:  [birth year]   DATE OF ADMISSION:  09/18/2005  DATE OF DISCHARGE:  09/27/2005                                 DISCHARGE SUMMARY   ADMITTING DIAGNOSES:  51.  A 60 year old female admitted with right upper quadrant pain with      dilated common bile duct, normal LFTs and no evidence for gallstones,      rule out common bile duct stone, cholecystitis, peptic ulcer disease.  2.  Adult onset diabetes mellitus.  3.  Sinusitis with recent antibiotics.  4.  Hypothyroidism.  5.  Status post hysterectomy.   DISCHARGE DIAGNOSES:  1.  Stable status post endoscopic retrograde cholangiopancreatography and      sphincterotomy. No evidence for stone on endoscopic retrograde      cholangiopancreatography and no change in pain post sphincterotomy.  2.  Cholecystitis stable status post laparoscopic cholecystectomy and      intraoperative cholangiogram September 26, 2005 with normal cholangiogram.  3.  Atypical pneumonia.  4.  Obstructive sleep apnea with nocturnal hypoxia.  5.  Smoker.  6.  Abnormal CT chest with several mediastinal nodes not technically      enlarged but follow-up recommended and bilateral ground glass air space      opacities right greater than left consistent with an atypical pneumonia.      Follow-up CT of the chest recommended in 4-6 weeks.   CONSULTATIONS:  1.  Surgery, Dr. Johna Sheriff.  2.  Internal medicine, Dr. Artist Pais.   PROCEDURES:  ERCP and sphincterotomy, laparoscopic cholecystectomy with IOC.   BRIEF HISTORY:  [name] is a 60 year old white female with no known chronic  GI problems.  She started about 5 days prior to admission with right upper  quadrant pain.  She was seen by Dr. Susann Givens the day prior to admission and  then scheduled for a CT  scan. CT shows dilation of the common bile duct to  the level of the small bowel suggesting a possible stricture.  There was no  evidence of choledocholithiasis or cholelithiasis.  She was started on  Nexium per Dr. Susann Givens.  After reviewing the CT scan, Dr. Susann Givens contacted  Dr. Arlyce Dice and  admission was advised for further diagnostic workup and ERCP  to rule out common bile duct stone.   LABORATORY STUDIES:  On admission June 16 WBC of 8.9, hemoglobin 13,  hematocrit of 38.4, MCV of 93.  Pro time 13.3, INR of 1.0.  Electrolytes  within normal limits.  Glucose 80, creatinine 0.8, albumin 3.2.  LFTs  completely normal.  Amylase 79 and lipase 27. CA19-9, 17.9.  Serial CBCs  were obtained on June 24, WBC was 9.4, hemoglobin 11.4, hematocrit of 33.8.  Follow-up LFTs on June 20 remained completely normal. UA showed many  epithelials otherwise negative.  Blood cultures negative x2.  AFB smear  pending. Urine for Legionella negative.  Respiratory  cultures from June 21  normal oral pharyngeal flora.  Sputum culture was obtained on June 21 and  was still pending final on the day of discharge.   X-RAY STUDIES:  HIDA scan on September 20, 2005 showed a distal common bile duct  obstruction, no evidence of cystic duct obstruction but the gallbladder  ejection fraction was abnormal with 0% emptying. Chest x-ray on June 20  question interval development of nodular density in the left apex. Chest CT  was recommended, question pulmonary arterial hypertension.  Chest CT September 24, 2005 showed no evidence of the left upper lobe nodule but there were  bilateral ground glass air space opacities in both upper lobes, right  greater than left consistent with a pneumonia. Several mediastinal nodes not  technically enlarged but requiring follow-up and follow-up CT was  recommended in several weeks. Gallbladder wall edema or pericholecystic  fluid incidentally noted, question acute cholecystitis. Sinus films on June   22 showed some diffuse mucosal thickening of the paranasal sinuses, no air-  fluid levels.   HOSPITAL COURSE:  The patient was admitted to the service of Dr. Lina Sar  who was covering the hospital, started on IV Cipro and IV Flagyl and  scheduled for ERCP the following day. This was done per Dr. Arlyce Dice and was a  normal exam. HIDA scan was then obtained which did show a ejection fraction  of zero, no evidence of cystic duct obstruction.  She also underwent upper  endoscopy on June 18 because of the complaints of pain and was found to have  a small hiatal hernia and esophagitis, otherwise normal exam.  A surgical  consultation was obtained.  It was felt that her symptoms were consistent  with biliary colic, but with negative workup.  This was not easy to prove.  It was felt that she may benefit from cholecystectomy.  She had the repeat  HIDA scan with results as outlined above.  She also developed a fever on  June 19.  With persistent normal LFTs, surgery requested preop  sphincterotomy prior to deciding on cholecystectomy and this was performed  by Dr. Juanda Chance on June 21. She did require an epinephrine injection as well  with hemostasis.  The patient did not have any change in her right upper  quadrant pain post sphincterotomy.  She, that evening, had developed a  temperature and had an abnormal chest x-ray. Medicine was consulted, she was  seen by Dr. Artist Pais and felt to have atypical pneumonia.  Sputum cultures were  obtained, urine for Legionella, etc. She was started on Levaquin. She was  also noted to have some nocturnal hypoxemia and had a prior diagnosis of  obstructive sleep apnea. Dr. Artist Pais recommended nocturnal oxygen and then  follow up as an outpatient for a sleep study and then CPAP titration.  Because of the abnormal x-ray of the chest, CT scan of the chest was  obtained with findings as outlined above. This also showed what appeared to be an edematous gallbladder. With that  information it was decided to proceed  with laparoscopic cholecystectomy which was done on June 23 per Dr.  Johna Sheriff. In fact, she was found to have cholecystitis with significant  inflammation and thickening of the gallbladder and a normal cholangiogram.  By June 24, she was feeling much better.  Her pain, nausea and vomiting had  subsided.  She was no longer febrile. Her diet was advanced, she was  converted to oral Avelox and on June 24  felt stable for discharge to home.  She was to follow up with Dr. Johna Sheriff in his office in 7-10 days, to stay  out of work until follow up with Dr. Johna Sheriff. She was discharged on home O2  q.h.s. 2 liters and was to follow up with her primary care physician, Dr.  Meredeth Ide, regarding obtaining formal sleep study and then fitting for CPAP.   MEDICATIONS ON DISCHARGE:  1.  Avelox 400 mg daily x 7 days,.  2.  Vicodin 5/500 q.6 h as needed.   Other usual medicines:  1.  Avandia 4 mg once daily.  2.  Metoprolol 40 mg once daily.  3.  Synthroid 200 mcg daily.  4.  Lipitor 20 q.d.   She was to follow up with Dr. Meredeth Ide in approximately 2 weeks and also will  need follow-up CT of the chest in approximately 4 weeks to be arranged per  Dr. Meredeth Ide.      Amy Esterwood, P.A.-C. [facility]      Malcolm T. Russella Dar, M.D. [facility]  Electronically Signed    AE/MEDQ  D:  09/28/2005  T:  09/28/2005  Job:  [id number]   cc:   Lorne Skeens. Hoxworth, M.D.  [address], [address]  [place]  [place] [address]   Dr. Meredeth Ide

## 2010-08-22 NOTE — Consult Note (Signed)
[name], [name]                 ACCOUNT NO.:  [id number]   MEDICAL RECORD NO.:  [id number]          PATIENT TYPE:  INP   LOCATION:  1511                         [facility]AC[facility]   PHYSICIAN:  Lebron Conners, M.D.   DATE OF BIRTH:  08-17-1950   DATE OF CONSULTATION:  09/21/2005  DATE OF DISCHARGE:                                   CONSULTATION   CHIEF COMPLAINT:  Abdominal pain.   PRESENT ILLNESS:  The patient is a 60 year old diabetic white female who has  no gastrointestinal diagnoses but has had intermittent sharp and sometimes  severe right upper quadrant abdominal pain often associated with eating,  often associated with nausea when it occurs, and sometimes making it  difficult for her move around.  She remembers no trauma, twisting or cough  or other injury that might have caused this.  She has been worked up for  this problem with ultrasounds which are normal except for a mildly enlarged  common bile duct of about 1 cm.  About a year ago she had a hepatobiliary  scan, which showed good filling of the gallbladder, some slight delay in  emptying but complete emptying of the gallbladder after 45 minutes.  It was  interpreted as normal.  This pain has continued to occur this despite being  on Nexium.  Her liver enzyme tests and bilirubin, amylase, lipase, CBC are  all normal.  CT of the abdomen shows no evidence of any other liver or intra-  abdominal problem, did confirm mild dilatation of the common bile duct.  Dr.  Juanda Chance did endoscopy demonstrating no abnormalities except for a small  hiatal hernia.  Dr. Arlyce Dice did ERCP confirming slight bile duct enlargement  but finding no stones or evidence of stricture.  I am consulted for  discussion of cholecystectomy because of the suggestion that this pain may  be due to biliary dyskinesia.   PAST MEDICAL HISTORY:  The patient has high blood pressure, hypothyroidism,  hyperlipidemia, type 2 diabetes.   MEDICATIONS:  1.  Avandia 4  mg daily  2.  Metoprolol 40 mg daily.  3.  Synthroid 200 mcg daily/  4.  Lipitor 20 mg daily.   No medication allergies.   FAMILY HISTORY:  Childhood illnesses, unremarkable.   The patient smokes about a half pack cigarettes a day.  She does not drink  alcoholic beverages.   SURGICAL:  She has had a hysterectomy.  She had had a thyroidectomy because  she was hyperthyroid and now has to have replacement.  She has had 3 back  surgeries.   REVIEW OF SYSTEMS:  No chest pain, no shortness of breath, no diarrhea, no  vomiting except after she was admitted to the hospital.  Remainder  unremarkable.   PHYSICAL EXAMINATION:  VITAL SIGNS:  Temperature and vital signs were normal  as recorded several times in the hospital record.  GENERAL:  Patient is in no acute distress.  Mental status normal.  She is  tearful and talkative.  HEAD AND NECK:  Eyes, ears, nose and throat unremarkable.  Anterior collar  incision.  No palpable thyroid.  No neck mass.  No icterus.  CHEST:  Clear to auscultation.  HEART:  Rate and rhythm normal, no murmur or gallop.  ABDOMEN:  Tender in the right upper quadrant without spasm or rebound,  otherwise unremarkable.  No mass, no hernia.  Bowel sounds normal.  EXTREMITIES:  Normal, no edema.  Pulses are normal.  SKIN:  No lesions are noted.  LYMPH NODES:  Not enlarged in the groin or neck.   IMPRESSION:  1.  Abdominal pain of unknown etiology, possible biliary dyskinesia.  2.  Type 2 diabetes.  3.  High blood pressure.  4.  Hyperlipidemia.   RECOMMENDATIONS:  I agree with the plan for repeat of the hepatobiliary  scan.  With or without positive results from that, the patient might benefit  from cholecystectomy.  I discussed that option with her at considerable  length.  I made it  of made clear to her that there would be no guarantee of  relief of pain without establishment of a more definite diagnosis of biliary  disease.  She understands that, and we will  discuss this further later.  We  might go with surgery later in the week, or this might be electively  scheduled if she elects to have surgery.      Lebron Conners, M.D.  Electronically Signed     WB/MEDQ  D:  09/21/2005  T:  09/22/2005  Job:  [id number]   cc:   Sharlot Gowda, M.D.  Fax: [phone]   Lina Sar, M.D. [facility]  [address]  [place]  [place] [address]

## 2010-08-22 NOTE — Op Note (Signed)
NAME:  [name], [name]                           ACCOUNT NO.:  [id number]   MEDICAL RECORD NO.:  [id number]                   PATIENT TYPE:  INP   LOCATION:  2899                                 FACILITY:  [facility]   PHYSICIAN:  Sharolyn Douglas, M.D.                     DATE OF BIRTH:  September 14, 1950   DATE OF PROCEDURE:  02/08/2003  DATE OF DISCHARGE:                                 OPERATIVE REPORT   PREOPERATIVE DIAGNOSIS:  L5-S1 degenerative disk disease and recurrent  herniated nucleus pulposus, status post laminotomy.   POSTOPERATIVE DIAGNOSIS:  L5-S1 degenerative disk disease and recurrent  herniated nucleus pulposus, status post laminotomy.   PROCEDURE:  1. Revision L5-S1 laminotomy with decompression of the left L5 and S1 nerve     roots.  2. Transforaminal lumbar interbody fusion L5-S1 with placement of a PEAK     prosthetic cage.  3. Posterior spinal arthrodesis, L5-S1.  4. Local autogenous bone graft.  5. Pedicle screw instrumentation L5-S1 utilizing the [employer]     Encompass System.  6. Neural monitoring using free running and triggered EMGs.   SURGEON:  Sharolyn Douglas, M.D.   ASSISTANT:  Verlin Fester, P.A.   ANESTHESIA:  General endotracheal anesthesia.   COMPLICATIONS:  None.   INDICATIONS FOR PROCEDURE:  The patient is a 60 year old female with  progressively worsening back and left lower extremity pain. She is status  post L5-S1 laminotomy and diskectomy. Her planned radiographs show severe  disk space narrowing at L5-S1. An MRI scan shows recurrent disk  herniation  at L5-S1 with epidural fibrosis. She has been refractory to all conservative  treatment options. Her symptoms have been worsening. She elected to undergo  revision, decompression and fusion at L5-S1 in hopes of improving  her  symptoms. The  risks, benefits and alternatives were extensively reviewed  with her.   DESCRIPTION OF PROCEDURE:  The patient was properly identified  in the  holding area and  taken to the operating room. She underwent general  endotracheal anesthesia without difficulty. She was given prophylactic IV  antibiotics. The EMG leads were placed for monitoring of free running and  triggered EMGs using the invasive NeuroVision system. She was carefully  turned prone onto the Wilson frame. All bony prominences were padded. The  patient's eyes were protected at all times. The back was prepped and draped  in the usual sterile fashion.   The previous 10-cm midline incision was utilized. She was noted to have a  very high lumbosacral angle. The sacrum was almost vertical. We carefully  worked our way through the previous scar tissue, identifying the spinous  processes of L4-5 and S1. We identified  the transverse processes of L5  bilaterally  as well as the sacral ala. The previous laminotomy was  visualized at L5-S1 on the left side. There was a significant amount of  epidural  fibrosis noted. We placed a deep retractor.   We then used a curet to dissect the epidural fibrosis from the laminotomy  edges. A facetectomy was carried out on the left. We identified  the S1  nerve root which was encased in scar tissue and adherent to the underlying  disk space. The L5 nerve root was being compressed within the L5-S1 foramen.  A complete pedicle-to-pedicle decompression was carried out.   We then gently mobilized the  thecal sac medially. The disk space was  identified using fluoroscopy. It was completely collapsed. We were able to  enter the disk space using an osteotome. Posterior osteophytes were removed.  We then used dilators to  sequentially elevate the disk space up to 9 mm. We  used specialized TLIF instruments to perform a radical diskectomy across the  contralateral side. We scraped the cartilaginous endplates. There was very  little disk  material within the interspace.   We then placed morselized bone that had been harvested from the facetectomy  and cleaned the  soft tissue into the interspace. We also placed a BNP  sponge from a small  infuse kit anteriorly  and across the midline. We then  placed an 18-mm invasive PEAK cage into the interspace which had been filled  with the remaining BNP  sponge from the small  kit. This was carefully  tamped anterior and across the midline. We confirmed good positioning with  fluoroscopy.   We then turned our attention to performing a posterolateral arthrodesis at  L5 and S1. A high-speed bur was used to remove the cortical bone from the L5  transverse processes and sacral ala bilaterally. We packed the remaining  local autologous bone graft into the lateral  gutters. We then turned our  attention to placing pedicle screws at L5 and S1 using anatomic landmarks as  well as direct palpation of the pedicles from within the canal. Fluoroscopy  was also utilized to place the screws.   We used 6.5 x 45 mm screws at L4 and 7.5 x 30 mm screws in the sacrum. We  had good screw purchase. We applied gentle compression across the interspace  before locking the titanium rods. Final tightening was carried out. We then  reevaluated the dural sac and the left L5 and S1 nerve roots. There was a  small  area where the epidural fibrosis had been dissected on the S1 nerve  root sleeve where the arachnoid was visible. A Valsalva maneuver showed no  CSF leak.   We therefore elected to reinforce the area with Tisseel fibrin sealant.  Gelfoam was then left over the epidural space. The wound was copiously  irrigated. We placed a deep Hemovac drain.   The deep fascia was closed with a running #1 Vicryl. The subcutaneous layer  was closed with 0 Vicryl and 2-0 Vicryl followed by 3-0 nylon in a running  fashion to approximate the skin edges. A sterile dressing was applied.   The patient was turned supine, extubated without difficulty and transferred to the recovery room in stable condition. It should be noted that after each   pedicle screw was placed, we utilized triggered EMGs. We had a response at  greater than 20 milliamps consistent with interosseous placement  of the  screws. In addition we monitored free running EMGs throughout the procedure  including the TLIF and there were no deleterious changes.  Sharolyn Douglas, M.D.    MC/MEDQ  D:  02/08/2003  T:  02/08/2003  Job:  [id number]

## 2010-08-22 NOTE — Discharge Summary (Signed)
NAME:  [name], [name]                           ACCOUNT NO.:  [id number]   MEDICAL RECORD NO.:  [id number]                   PATIENT TYPE:  INP   LOCATION:  5023                                 FACILITY:  [facility]   PHYSICIAN:  Sharolyn Douglas, M.D.                     DATE OF BIRTH:  [birth year]   DATE OF ADMISSION:  02/08/2003  DATE OF DISCHARGE:  02/12/2003                                 DISCHARGE SUMMARY   ADMISSION DIAGNOSES:  1. L5-S1 herniated nucleus pulposus and degenerative joint disease.  2. Mitral valve prolapse.  3. Diabetes.  4. Hyperparathyroidism.  5. Gastroesophageal reflux disease.   DISCHARGE DIAGNOSES:  1. Status post L5-S1 post-spinal fusion, doing well.  2. Postoperative hemorrhagic anemia which was very mild and completely     asymptomatic not requiring a blood transfusion.  3. Mitral valve prolapse.  4. Diabetes.  5. Hyperparathyroidism.  6. Gastroesophageal reflux disease.   PROCEDURE:  L5-S2 post-spinal fusion with pedicle screws and VFP and  ________.  This was done at [facility], Surgeon Dr. Sharolyn Douglas, assistant  [facility]  Anesthesia general.   CONSULTATIONS:  None.   LABORATORY DATA:  CBC with differential preoperatively was within normal  limits with a hemoglobin of 15.4.  H&H were monitored three days  postoperatively which Hemoglobin 12.5, hematocrit 33.9 respectively and she  was asymptomatic and did not require transfusion. These were 12.4 and 35.4  on the following day.  PT, INR was followed.  PTT was 25 preoperatively.  A  complete metabolic panel preoperatively revealed a glucose of 139.  Basic  metabolic panel postoperatively was normal with exception of glucose of 133.  Urinalysis from February 05, 2003 was negative.  Blood type B, Rh type  positive, antibody screen negative.   EKG from February 05, 2003 showed normal sinus rhythm, nonspecific T wave  abnormality confirmed.  X-rays from February 12, 2003 of the lumbar spine  showed  posterior fusion at L5-S1.  A portable x-ray was used  intraoperatively to confirm position.   BRIEF HISTORY:  The patient is a 60 year old female who has been having back  and left lower extremity pain since August, 2004.  The pain is severe in  nature and has been getting progressively worse, with failed conservative  management.  It has gotten to the point of severity limiting her quality of  life and activities of daily living secondary to severe these severe  findings as well as her MRI it was felt the best course of management would  be operative intervention.  The risks and benefits were discussed with the  patient of this were discussed with the patient by Dr. Noel Gerold as well as  myself.  She indicated her understanding and opted to proceed.   HOSPITAL COURSE:  On February 08, 2003 the patient was taken to the operating  room for the above  listed procedure.  She tolerated the procedure well  without any intraoperative complications.  Blood loss was minimal.  A  Hemovac drain was placed intraoperatively and she was transferred to the  recovery room in stable condition.  Postoperatively we orthopedic spine  protocol was followed.  She did lie flat x24 hours secondary to a small  subdural tear that was repaired and reinforced intraoperatively.  She began  progressively getting up on postoperative day one, progressively sitting up  in her bed to the point that she was eating upright.  Got up to a chair and  ambulated with therapy later that day.  She did very well and did not  develop any headaches, nausea or vomiting or problems.  Her Foley was  discontinued on February 10, 2003.  She progressed well with physical  therapy.  By February 12, 2003 the patient had met all our orthopedic goals,  she was medically stable and ready for discharge home.  She was feeling  well.   Care management has been consulted to assist [place] with her home needs as well  as Home Health needs.  This was set up  through [facility].   DISCHARGE PLAN:  The patient is a 60 year old female status post spinal  fusion at L5-S1, doing well.   ACTIVITY:  Daily ambulation, brace on when up.  She may shower.  No lifting  greater than five pounds.  Back precautions were discussed and understood.  She is to follow up two weeks postoperatively with Dr. Noel Gerold.   DISCHARGE MEDICATIONS:  Percocet and Robaxin, multivitamins, calcium and  over-the-counter laxative p.r.n.   DIET:  1800 calorie ADA diet is recommended.   CONDITION ON DISCHARGE:  Stable and improved.   DISPOSITION:  The patient is discharged to her home with Home Health  physical therapy and occupational therapy as well as her family for  assistance.      Verlin Fester, P.A.                       Sharolyn Douglas, M.D.    CM/MEDQ  D:  04/04/2003  T:  04/04/2003  Job:  [id number]

## 2010-08-22 NOTE — Op Note (Signed)
NAMEFALLYN, MUNNERLYN                 ACCOUNT NO.:  [id number]   MEDICAL RECORD NO.:  [id number]          PATIENT TYPE:  INP   LOCATION:  1511                         FACILITY:  [facility]   PHYSICIAN:  Sharlet Salina T. Hoxworth, M.D.DATE OF BIRTH:  1951-02-20   DATE OF PROCEDURE:  09/26/2005  DATE OF DISCHARGE:                                 OPERATIVE REPORT   PREOPERATIVE DIAGNOSIS:  Cholecystitis.   POSTOPERATIVE DIAGNOSIS:  Cholecystitis.   SURGICAL PROCEDURES:  Laparoscopic cholecystectomy and operative  cholangiogram.   SURGEON:  Sharlet Salina T. Hoxworth, M.D.   ASSISTANT:  Alfonse Ras, M.D.   ANESTHESIA:  General.   BRIEF HISTORY:  Ms. Shutter is a 60 year old diabetic female who presents with  persistent right upper quadrant abdominal pain.  She has had extensive an  workup including a gallbladder ultrasound that was negative.  A HIDA scan  showed poor emptying of the common bile duct after a CT scan revealed some  enlargement of the common bile duct.  She has had an ERCP and  sphincterotomy.  She continues to have right upper quadrant pain.  A  subsequent CT scan has shown some apparent significant thickening of the  gallbladder wall.  She was felt to have ongoing cholecystitis, probably  acalculous, and laparoscopic cholecystectomy has been recommend and  accepted.  The nature of the procedure, indications, risks of bleeding,  infection, bile leak, bile duct injury, and failure to relieve her symptoms,  have been discussed and understood.  She is now brought to the operating  room for this procedure.   DESCRIPTION OF OPERATION:  The patient was brought to the operating room and  placed in supine position on the operating table and general endotracheal  anesthesia was induced.  She was already on broad-spectrum antibiotics.  The  abdomen was widely sterilely prepped and draped.  The correct patient and  procedure were verified.  Local anesthesia was used to infiltrate the  trocar  sites prior to the incisions.  A 1 cm incision was made at the umbilicus.  Dissection was carried down to the midline fascia, which was sharply incised  for 1 cm and the peritoneum entered under direct vision.  Through a mattress  sutures of 0 Vicryl, the Hasson trocar was placed, pneumoperitoneum  established.  Under direct vision a 10 mm trocar was placed in the  subxiphoid area two 5 mm trocars on the right subcostal margin.  The  gallbladder did appear thickened and edematous.  The fundus was grasped and  elevated and there were significant omental adhesions to the gallbladder.  These were carefully taken down with cautery dissection.  There were some  fairly dense adhesions around the infundibulum where the gallbladder  appeared subacutely inflamed and quite thickened.  The infundibulum was  carefully exposed and then grasped, retracted inferolaterally, and further  fibrofatty tissue was stripped off the neck of the gallbladder toward the  porta hepatis.  The distal gallbladder was dissected and the cystic duct  identified and the cystic duct-gallbladder junction dissected 360 degrees.  There was quite a bit of inflammatory change  down around the cystic duct and  the infundibulum.  The cystic duct was dissected free of about a centimeter  and clipped at its junction with the gallbladder.  Operative cholangiogram  was then obtained, which showed good filling of the slightly dilated common  bile duct, but it tapered nicely and emptied into the duodenum and there was  good flow into the right left hepatic ducts.  Following this, the  cholangiocath was removed and the cystic duct was doubly clipped proximally  and divided.  The gallbladder was then dissected free from its bed.  Branches of the cystic artery were controlled with clips at the gallbladder  wall..  There was quite a bit of thickening of the posterior gallbladder  wall.  It was clearly subacutely inflamed.  The  gallbladder was detached and  placed in an EndoCatch bag and brought out through the umbilicus.  Hemostasis was obtained in the gallbladder wall with cautery and a Surgicel  pack.  The right upper quadrant was irrigated and complete hemostasis  assured.  Trocars were removed under direct vision and all CO2 evacuated.  The mattress suture was secured at the umbilicus.  The skin incisions were  closed with interrupted subcuticular 4-0 Monocryl and Steri-Strips.  Sponge,  needle and instrument counts correct.  Dry sterile dressings were applied  and the patient taken to recovery in good condition.      Lorne Skeens. Hoxworth, M.D.  Electronically Signed     BTH/MEDQ  D:  09/26/2005  T:  09/26/2005  Job:  [id number]

## 2010-08-22 NOTE — Op Note (Signed)
[name], [name]                 ACCOUNT NO.:  [id number]   MEDICAL RECORD NO.:  [id number]          PATIENT TYPE:  INP   LOCATION:  1511                         FACILITY:  [facility]   PHYSICIAN:  Lina Sar, M.D. [facility]  DATE OF BIRTH:  1950/11/08   DATE OF PROCEDURE:  09/24/2005  DATE OF DISCHARGE:                                 OPERATIVE REPORT   NAME OF PROCEDURE:  Endoscopic sphincterotomy.   INDICATIONS:  This 60 year old white female was admitted with right upper  quadrant abdominal pain and abnormal HIDA scan which showed poor evacuation  of the tracer into the small bowel.  ERCP several days ago showed normal  common bile duct and patient had at that time normal liver function tests.  She has continued, however, to have fever and right upper quadrant abdominal  pain.  Repeat HIDA scan showed essentially no emptying of the common bile  duct into the small intestine up to two and a half hours.  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   ENDOSCOPE:  Olympus single channel side-viewing duodenoscope.   SEDATION:  Versed 10 mg IV, fentanyl 100 mcg IV, glucagon 0.75 mg IV, and  epinephrine 2 mL.   Olympus single channel video endoscope passed blindly through the esophagus  into the stomach through pylorus into the duodenum.  Papilla appeared  essentially normal.  It was not enlarged and there was some bile exiting  from the papilla.  It was cannulated without difficulty and we avoided  cannulation of the main pancreatic duct.  The cholangiogram again showed  common bile duct which was upper limits of normal but it had smooth tapering  at the end without evidence of irregularity.  Standard sphincterotomy was  carried out with [facility].  This was a medium sized  sphincterotomy.  A small amount of bleeding both  occurred at the end of the  sphincterotomy and epinephrine 1:10,000 solution was injected with the  sclerosing needle into the sphincterotomy in two quadrants.  Hemostasis was  achieved.  At that point a balloon sweep over the buttock was not repeated  since it was already done several days ago because concern for possible  precipitation of the bleeding at the sphincterotomy.   IMPRESSION:  1.  Status post endoscopic sphincterotomy for common bile duct obstruction.  2.  Avoidance of main pancreatic duct visualization.  3.  Injection of epinephrine a total of 2 mL of 1:10,000 solution to achieve      hemostasis at the sphincterotomy site.   PLAN:  We will observe patient for relief of her symptoms.  Also, surgeons  are following the patient for possible cholecystectomy.      Lina Sar, M.D. [facility]  Electronically Signed     DB/MEDQ  D:  09/24/2005  T:  09/24/2005  Job:  [id number]   cc:   Barbette Hair. Arlyce Dice, M.D. [facility]  [address]  [place]  [place] [address]

## 2010-08-22 NOTE — H&P (Signed)
NAME:  [name], [name]                           ACCOUNT NO.:  [id number]   MEDICAL RECORD NO.:  [id number]                   PATIENT TYPE:  INP   LOCATION:  2899                                 FACILITY:  [facility]   PHYSICIAN:  Sharolyn Douglas, M.D.                     DATE OF BIRTH:  1950-09-30   DATE OF ADMISSION:  02/08/2003  DATE OF DISCHARGE:                                HISTORY & PHYSICAL   CHIEF COMPLAINT:  Back pain and severe left lower extremity pain.   HISTORY OF PRESENT ILLNESS:  The patient is a 60 year old female who has  been having back and left lower extremity pain since August of this year.  The pain is severe in nature.  It has been getting progressively worse.  She  has failed conservative management, including anti-inflammatories, narcotic  pain medications, activity modification, rest and physical therapy.  It has  gotten to the point that it is severe in nature.  It is effecting her  quality of life and ability to function at work.  Secondary to her severe  findings, as well as her MRI findings, it is felt the best course of  management would be a posterior spinal fusion at L5-S1.  The benefits and  risks were discussed with the patient by Dr. Sharolyn Douglas, as well as by  myself.  She indicated her understanding, and opted to proceed.   ALLERGIES:  No known drug allergies.   CURRENT MEDICATIONS:  1. Toprol.  2. Metformen.  3. Hydrochlorothiazide.  4. Levoxyl.  She does not have the doses with her today.  She will bring all     of her current medication doses with her to the hospital.   PAST MEDICAL HISTORY:  1. Mitral valve prolapse.  2. Diabetes.  3. Hypothyroidism.  4. Gastroesophageal reflux disease.   PAST SURGICAL HISTORY:  1. A microdiskectomy about 15 years ago.  2. Hysterectomy.  3. Thyroidectomy.  4. Foot surgery.   SOCIAL HISTORY:  The patient smokes 1/2 to 1 pack of cigarettes per day.  Denies alcohol use.  She is married.  She has two grown  children.  Her  family will be available to help her postoperatively.   FAMILY HISTORY:  Significant for coronary artery disease, diabetes, leukemia  and stroke.   REVIEW OF SYSTEMS:  The patient denies fevers, chills, sweats or bleeding  tendencies.  CNS:  Denies blurred vision, double vision, seizures, or  paralysis.  CARDIOVASCULAR:  Denies chest pain, angina, orthopnea,  claudication, or palpitations.  PULMONARY:  Denies shortness of breath,  _____________, or hemoptysis.  GI:  Denies nausea and vomiting, diarrhea,  melena, or bloody stools.  GENITOURINARY:  Denies dysuria, hematuria, or  discharge.  MUSCULOSKELETAL:  As per the HPI.   PHYSICAL EXAMINATION:  VITAL SIGNS:  Blood pressure 108/70, respirations 16  and unlabored, pulse 96 and regular.  GENERAL:  The patient is a 60 year old white female, alert and oriented, in  no acute distress.  She is well-nourished, well-developed.  The patient is  pleasant and cooperative to examination.  HEENT:  Normocephalic, atraumatic.  Pupils equal, round, reactive to light.  Extraocular movements are intact.  Nares patent.  Pharynx clear.  No  icterus.  NECK:  Soft to palpation, no bruits appreciated.  No lymphadenopathy.  LUNGS:  Clear to auscultation bilaterally.  No rales, rhonchi, or wheezes.  BREASTS:  Not performed, not pertinent.  HEART:  S1, S2, regular rate and rhythm.  The patient does have a  holosystolic 1-2/6 murmur.  No gallops or rubs.  ABDOMEN:  Soft to palpation, nontender, nondistended.  No organomegaly  noted.  Positive bowel sounds throughout.  GENITOURINARY:  Not pertinent, not performed.  EXTREMITIES:  The patient has left lower extremity pain.  Motor function and  sensation are grossly intact.  Pulses are intact and symmetric bilaterally.  SKIN:  Is intact without any lesions or rashes.  An MRI shows a herniated nucleus pulposus and degenerative disk disease at  L5-S1.   IMPRESSION:  1. Herniated nucleus pulposus  and degenerative disk disease at lumbar-5 and     sacal-1.  2. Mitral valve prolapse.  3. Diabetes.  4. Hypothyroidism.  5. Gastroesophageal reflux disease.   PLAN:  Admit to [facility] on February 08, 2003, for an  L5-S1 posterior spinal fusion with pedicle screws.  This will bel done by  Dr. Noel Gerold.   PRIMARY CARE PHYSICIAN:  Dr. Talmadge Coventry at [facility].  Certainly if we have any medical issues in the perioperative period, we will  not hesitate to call Dr. Smith Mince.      Verlin Fester, P.A.                       Sharolyn Douglas, M.D.    CM/MEDQ  D:  02/08/2003  T:  02/08/2003  Job:  [id number]

## 2010-10-07 ENCOUNTER — Other Ambulatory Visit: Payer: Self-pay

## 2010-10-16 ENCOUNTER — Other Ambulatory Visit: Payer: Self-pay | Admitting: Family Medicine

## 2010-10-16 NOTE — Telephone Encounter (Signed)
Left message to call office to verify what type of glucometer Pt has.

## 2010-10-20 MED ORDER — GLUCOSE BLOOD VI STRP: ORAL_STRIP | Status: DC

## 2010-10-20 NOTE — Telephone Encounter (Signed)
Pt return call stating that she has One touch monitor.

## 2010-10-20 NOTE — Telephone Encounter (Signed)
Rx faxed to [phone]

## 2010-11-21 ENCOUNTER — Other Ambulatory Visit: Payer: Self-pay | Admitting: Family Medicine

## 2010-11-21 DIAGNOSIS — E785 Hyperlipidemia, unspecified: Secondary | ICD-10-CM

## 2010-11-24 ENCOUNTER — Other Ambulatory Visit (INDEPENDENT_AMBULATORY_CARE_PROVIDER_SITE_OTHER): Payer: Managed Care, Other (non HMO)

## 2010-11-24 DIAGNOSIS — E785 Hyperlipidemia, unspecified: Secondary | ICD-10-CM

## 2010-11-24 LAB — HEPATIC FUNCTION PANEL
ALT: 18 U/L (ref 0–35)
Albumin: 4.1 g/dL (ref 3.5–5.2)
Bilirubin, Direct: 0.1 mg/dL (ref 0.0–0.3)
Total Bilirubin: 0.4 mg/dL (ref 0.3–1.2)
Total Protein: 7.8 g/dL (ref 6.0–8.3)

## 2010-11-24 LAB — BASIC METABOLIC PANEL
BUN: 13 mg/dL (ref 6–23)
CO2: 31 mEq/L (ref 19–32)
Calcium: 9.5 mg/dL (ref 8.4–10.5)
Chloride: 100 mEq/L (ref 96–112)
Creatinine, Ser: 0.7 mg/dL (ref 0.4–1.2)
GFR: 93.68 mL/min (ref >60.00)
Potassium: 4.9 mEq/L (ref 3.5–5.1)
Sodium: 139 mEq/L (ref 135–145)

## 2010-11-24 LAB — HEMOGLOBIN A1C: Hgb A1c MFr Bld: 9.6 % — ABNORMAL HIGH (ref 4.6–6.5)

## 2010-11-24 LAB — LIPID PANEL
HDL: 51 mg/dL (ref >39.00)
LDL Cholesterol: 114 mg/dL — ABNORMAL HIGH (ref 0–99)
Triglycerides: 111 mg/dL (ref 0.0–149.0)
VLDL: 22.2 mg/dL (ref 0.0–40.0)

## 2010-11-24 NOTE — Progress Notes (Signed)
Labs only

## 2010-11-27 ENCOUNTER — Ambulatory Visit: Payer: Managed Care, Other (non HMO) | Admitting: Family Medicine

## 2010-12-02 ENCOUNTER — Telehealth: Payer: Self-pay | Admitting: Family Medicine

## 2010-12-02 ENCOUNTER — Telehealth: Payer: Self-pay | Admitting: Endocrinology

## 2010-12-02 NOTE — Telephone Encounter (Signed)
Discussed with patient and she has been scheduled Thursday with Dr.Lowne to follow up on labs    KP

## 2010-12-02 NOTE — Telephone Encounter (Signed)
[name] called asking for her lab results.  She said she had them done at [facility], but the staff there told her they were sent to Dr. Everardo All so he would give her the results.  I asked her which doctor ordered the labs.  She told me she called [facility] to make a lab appt and she ordered the labs herself.

## 2010-12-02 NOTE — Telephone Encounter (Signed)
i have only seen pt for thyroid, but this blood sugar is pretty high.  i would be happy to see you for the diabetes if you wish.

## 2010-12-02 NOTE — Telephone Encounter (Signed)
I spoke with Dr Laury Axon Assistant Selena Batten who was made aware that labs were ordered prior to pt's appt 08/23 that was subsequently cancelled. Selena Batten acknowledged these labs and will make Dr Laury Axon aware that pt is requesting results.

## 2010-12-04 ENCOUNTER — Encounter: Payer: Self-pay | Admitting: Family Medicine

## 2010-12-04 ENCOUNTER — Ambulatory Visit (INDEPENDENT_AMBULATORY_CARE_PROVIDER_SITE_OTHER): Payer: Managed Care, Other (non HMO) | Admitting: Family Medicine

## 2010-12-04 VITALS — BP 118/72 | HR 100 | Temp 97.9°F | Wt 130.4 lb

## 2010-12-04 DIAGNOSIS — I10 Essential (primary) hypertension: Secondary | ICD-10-CM

## 2010-12-04 DIAGNOSIS — E785 Hyperlipidemia, unspecified: Secondary | ICD-10-CM

## 2010-12-04 DIAGNOSIS — F419 Anxiety disorder, unspecified: Secondary | ICD-10-CM

## 2010-12-04 DIAGNOSIS — E119 Type 2 diabetes mellitus without complications: Secondary | ICD-10-CM

## 2010-12-04 DIAGNOSIS — F411 Generalized anxiety disorder: Secondary | ICD-10-CM

## 2010-12-04 DIAGNOSIS — E039 Hypothyroidism, unspecified: Secondary | ICD-10-CM

## 2010-12-04 MED ORDER — LEVOTHYROXINE SODIUM 112 MCG PO TABS: 112.0000 ug | ORAL_TABLET | ORAL | Status: DC

## 2010-12-04 MED ORDER — SAXAGLIPTIN HCL 5 MG PO TABS: 5.0000 mg | ORAL_TABLET | Freq: Every day | ORAL | Status: DC

## 2010-12-04 MED ORDER — GLUCOSE BLOOD VI STRP: ORAL_STRIP | Status: DC

## 2010-12-04 MED ORDER — LEVOTHYROXINE SODIUM 125 MCG PO TABS: 125.0000 ug | ORAL_TABLET | ORAL | Status: DC

## 2010-12-04 MED ORDER — ALPRAZOLAM 0.5 MG PO TABS: 0.5000 mg | ORAL_TABLET | Freq: Three times a day (TID) | ORAL | Status: DC | PRN

## 2010-12-04 MED ORDER — PRAVASTATIN SODIUM 20 MG PO TABS: 20.0000 mg | ORAL_TABLET | Freq: Every evening | ORAL | Status: DC

## 2010-12-04 MED ORDER — HYDROCHLOROTHIAZIDE 25 MG PO TABS: 25.0000 mg | ORAL_TABLET | Freq: Every day | ORAL | Status: DC

## 2010-12-04 NOTE — Patient Instructions (Signed)
Diabetes Meal Planning Guide The diabetes meal planning guide is a tool to help you plan your meals and snacks. It is important for people with diabetes to manage their blood sugar levels. Choosing the right foods and the right amounts throughout your day will help control your blood sugar. Eating right can even help you improve your blood pressure and reach or maintain a healthy weight. CARBOHYDRATE COUNTING MADE EASY When you eat carbohydrates, they turn to sugar (glucose). This raises your blood sugar level. Counting carbohydrates can help you control this level so you feel better. When you plan your meals by counting carbohydrates, you can have more flexibility in what you eat and balance your medicine with your food intake. Carbohydrate counting simply means adding up the total amount of carbohydrate grams (g) in your meals or snacks. Try to eat about the same amount at each meal. Foods with carbohydrates are listed below. Each portion below is 1 carbohydrate serving or 15 grams of carbohydrates. Ask your dietician how many grams of carbohydrates you should eat at each meal or snack. Grains and Starches 1 slice bread 1/2 English muffin or hotdog/hamburger bun 3/4 cup cold cereal (unsweetened) 1/3 cup cooked pasta or rice 1/2 cup starchy vegetables (corn, potatoes, peas, beans, winter squash) 1 tortilla (6 inches) 1/4 bagel 1 waffle or pancake (size of a CD) 1/2 cup cooked cereal 4 to 6 small crackers *Whole grain is recommended Fruit 1 cup fresh unsweetened berries, melon, papaya, pineapple 1 small fresh fruit 1/2 banana or mango 1/2 cup fruit juice (4 ounces unsweetened) 1/2 cup canned fruit in natural juice or water 2 tablespoons dried fruit 12 to 15 grapes or cherries Milk and Yogurt 1 cup fat-free or 1% milk 1 cup soy milk 6 ounces light yogurt with sugar-free sweetener 6 ounces low-fat soy yogurt 6 ounces plain yogurt Vegetables 1 cup raw or 1/2 cup cooked is counted as 0  carbohydrates or a "free" food. If you eat 3 or more servings at one meal, count them as 1 carbohydrate serving. Other Carbohydrates 3/4 ounces chips or pretzels 1/2 cup ice cream or frozen yogurt 1/4 cup sherbet or sorbet 2 inch square cake, no frosting 1 tablespoon honey, sugar, jam, jelly, or syrup 2 small cookies 3 squares of graham crackers 3 cups popcorn 6 crackers 1 cup broth-based soup Count 1 cup casserole or other mixed foods as 2 carbohydrate servings. Foods with less than 20 calories in a serving may be counted as 0 carbohydrates or a "free" food. You may want to purchase a book or computer software that lists the carbohydrate gram counts of different foods. In addition, the nutrition facts panel on the labels of the foods you eat are a good source of this information. The label will tell you how big the serving size is and the total number of carbohydrate grams you will be eating per serving. Divide this number by 15 to obtain the number of carbohydrate servings in a portion. Remember: 1 carbohydrate serving equals 15 grams of carbohydrate. SERVING SIZES Measuring foods and serving sizes helps you make sure you are getting the right amount of food. The list below tells how big or small some common serving sizes are.  1 ounce (oz) of cheese.................................4 stacked dice.   2 to 3 oz cooked meat..................................Deck of cards.   1 teaspoon (tsp)............................................Tip of little finger.   1 tablespoon (tbs).........................................Thumb.   2 tbs.............................................................Golf ball.    cup...........................................................Half of a fist.   1 cup............................................................A fist.    SAMPLE DIABETES MEAL PLAN Below is a sample meal plan that includes foods from the grain and starches, dairy, vegetable, fruit, and  meat groups. A dietician can individualize a meal plan to fit your calorie needs and tell you the number of servings needed from each food group. However, controlling the total amount of carbohydrates in your meal or snack is more important than making sure you include all of the food groups at every meal. You may interchange carbohydrate containing foods (dairy, starches, and fruits). The meal plan below is an example of a 2000 calorie diet using carbohydrate counting. This meal plan has 17 carbohydrate servings (carb choices). Breakfast 1 cup oatmeal (2 carb choices) 3/4 cup light yogurt (1 carb choice) 1 cup blueberries (1 carb choice) 1/4 cup almonds  Snack 1 large apple (2 carb choices) 1 low-fat string cheese stick  Lunch Chicken breast salad:  1 cup spinach   1/4 cup chopped tomatoes   2 oz chicken breast, sliced   2 tbs low-fat Italian dressing  12 whole-wheat crackers (2 carb choices) 12 to 15 grapes (1 carb choice) 1 cup low-fat milk (1 carb choice)  Snack 1 cup carrots 1/2 cup hummus (1 carb choice)  Dinner 3 oz broiled salmon 1 cup brown rice (3 carb choices)  Snack 1 1/2 cups steamed broccoli (1 carb choice) drizzled with 1 tsp olive oil and lemon juice 1 cup light pudding (2 carb choices)  DIABETES MEAL PLANNING WORKSHEET Your dietician can use this worksheet to help you decide how many servings of foods and what types of foods are right for you.  Breakfast Food Group and Servings Carb Choices Grain/Starches _______________________________________ Dairy ______________________________________________ Vegetable _______________________________________ Fruit _______________________________________________ Meat _______________________________________________ Fat _____________________________________________ Lunch Food Group and Servings Carb Choices Grain/Starches ________________________________________ Dairy _______________________________________________ Fruit  ________________________________________________ Meat ________________________________________________ Fat _____________________________________________ Dinner Food Group and Servings Carb Choices Grain/Starches ________________________________________ Dairy _______________________________________________ Fruit ________________________________________________ Meat ________________________________________________ Fat _____________________________________________ Snacks Food Group and Servings Carb Choices Grain/Starches ________________________________________ Dairy _______________________________________________ Vegetable ________________________________________ Fruit ________________________________________________ Meat ________________________________________________ Fat _____________________________________________ Daily Totals Starches _________________________ Vegetable __________________________ Fruit ______________________________ Dairy ______________________________ Meat ______________________________ Fat ________________________________  Document Released: 12/18/2004 Document Re-Released: 09/10/2009 Exit[employer] Patient Information 2011 [employer]. 

## 2010-12-04 NOTE — Progress Notes (Signed)
  Subjective:     [name] is a 60 y.o. female who presents for follow up of diabetes.. Current symptoms include: hyperglycemia. Patient denies foot ulcerations, hypoglycemia , increased appetite, nausea, paresthesia of the feet, polydipsia, polyuria, visual disturbances, vomiting and weight loss. Evaluation to date has been: fasting blood sugar, fasting lipid panel, hemoglobin A1C and microalbuminuria. Home sugars: have been running high since steroid shot in knee. Current treatments: more intensive attention to diet which has been not very effective, Discontinued sulfonylurea which has been ineffective and Started statin which has been unable to assess effectiveness. Last dilated eye exam due.  The following portions of the patient's history were reviewed and updated as appropriate: allergies, current medications, past family history, past medical history, past social history, past surgical history and problem list.  Review of Systems Pertinent items are noted in HPI.    Objective:    BP 118/72  Pulse 100  Temp(Src) 97.9 F (36.6 C) (Oral)  Wt 130 lb 6.4 oz (59.149 kg)  SpO2 95% General appearance: alert, cooperative, appears stated age and no distress Lungs: clear to auscultation bilaterally Heart: S1, S2 normal Extremities: extremities normal, atraumatic, no cyanosis or edema   Sensory exam of the foot is normal, tested with the monofilament. Good pulses, no lesions or ulcers, good peripheral pulses.   Patient was evaluated for proper footwear and sizing.  Laboratory: No components found with this basename: A1C      Assessment:    Diabetes mellitus Type II, under poor control.    HTN   hyperlipidemia   Plan:    Discussed general issues about diabetes pathophysiology and management. Addressed ADA diet. Discussed foot care. Discontinued sulfonylurea; see medication orders.  Labs: fasting blood sugar, fasting lipid panel, hemoglobin A1C and microalbuminuria. Reminded  to bring in blood sugar diary at next visit. Follow up in 3 months or as needed.  onglyza 5 mg daily Start statin

## 2010-12-11 ENCOUNTER — Ambulatory Visit: Payer: Managed Care, Other (non HMO) | Admitting: Family Medicine

## 2011-02-04 ENCOUNTER — Telehealth: Payer: Self-pay | Admitting: Family Medicine

## 2011-02-04 NOTE — Telephone Encounter (Signed)
ERROR

## 2011-03-12 ENCOUNTER — Other Ambulatory Visit: Payer: Self-pay | Admitting: Family Medicine

## 2011-04-30 ENCOUNTER — Encounter: Payer: Self-pay | Admitting: Family Medicine

## 2011-04-30 ENCOUNTER — Ambulatory Visit (INDEPENDENT_AMBULATORY_CARE_PROVIDER_SITE_OTHER): Payer: Managed Care, Other (non HMO) | Admitting: Family Medicine

## 2011-04-30 VITALS — BP 108/70 | HR 97 | Temp 97.8°F | Wt 132.6 lb

## 2011-04-30 DIAGNOSIS — J329 Chronic sinusitis, unspecified: Secondary | ICD-10-CM

## 2011-04-30 DIAGNOSIS — J019 Acute sinusitis, unspecified: Secondary | ICD-10-CM

## 2011-04-30 MED ORDER — FLUTICASONE PROPIONATE 50 MCG/ACT NA SUSP: 2.0000 | Freq: Every day | NASAL | Status: DC

## 2011-04-30 MED ORDER — AMOXICILLIN-POT CLAVULANATE 875-125 MG PO TABS: 1.0000 | ORAL_TABLET | Freq: Two times a day (BID) | ORAL | Status: AC

## 2011-04-30 MED ORDER — SAXAGLIPTIN HCL 5 MG PO TABS: 5.0000 mg | ORAL_TABLET | Freq: Every day | ORAL | Status: DC

## 2011-04-30 MED ORDER — CELECOXIB 200 MG PO CAPS: 200.0000 mg | ORAL_CAPSULE | Freq: Every day | ORAL | Status: DC

## 2011-04-30 MED ORDER — FLUOXETINE HCL 20 MG PO CAPS: 20.0000 mg | ORAL_CAPSULE | Freq: Every day | ORAL | Status: DC

## 2011-04-30 NOTE — Progress Notes (Signed)
Addended by: Arnette Norris on: 04/30/2011 05:00 PM   Modules accepted: Orders

## 2011-04-30 NOTE — Patient Instructions (Signed)

## 2011-04-30 NOTE — Progress Notes (Signed)
  Subjective:     [name] is a 61 y.o. female who presents for evaluation of sinus pain. Symptoms include: congestion, cough, headaches, nasal congestion and sinus pressure. Onset of symptoms was 3 days ago. Symptoms have been gradually worsening since that time. Past history is significant for no history of pneumonia or bronchitis. Patient is a non-smoker.  The following portions of the patient's history were reviewed and updated as appropriate: allergies, current medications, past family history, past medical history, past social history, past surgical history and problem list.  Review of Systems Pertinent items are noted in HPI.   Objective:    BP 108/70  Pulse 97  Temp(Src) 97.8 F (36.6 C) (Oral)  Wt 132 lb 9.6 oz (60.147 kg)  SpO2 96% General appearance: alert, cooperative, appears stated age and no distress Ears: normal TM's and external ear canals both ears Nose: green discharge, moderate congestion, turbinates red, swollen, edematous, sinus tenderness bilateral Throat: abnormal findings: mild oropharyngeal erythema Neck: moderate anterior cervical adenopathy, supple, symmetrical, trachea midline and thyroid not enlarged, symmetric, no tenderness/mass/nodules Lungs: clear to auscultation bilaterally Heart: S1, S2 normal Extremities: extremities normal, atraumatic, no cyanosis or edema    Assessment:    Acute bacterial sinusitis.    Plan:    Nasal steroids per medication orders. Augmentin per medication orders. Follow up in 5 days or as needed.

## 2011-05-14 ENCOUNTER — Telehealth: Payer: Self-pay | Admitting: Family Medicine

## 2011-05-14 MED ORDER — MOXIFLOXACIN HCL 400 MG PO TABS: 400.0000 mg | ORAL_TABLET | Freq: Every day | ORAL | Status: AC

## 2011-05-14 NOTE — Telephone Encounter (Signed)
avelox 400 mg 1 po qd for 10 day   con't nose spray\ Ov if no better after this

## 2011-05-14 NOTE — Telephone Encounter (Signed)
Patient states that she is not feeling any better since last weeks visit. She would like another round of antibiotic called in to [facility] on [address]

## 2011-05-14 NOTE — Telephone Encounter (Signed)
Pt still c/o ear pain, sinus headache/pressure,cough yellow mucous, head congestion. Pt denies any fever, sob, sore throat

## 2011-05-14 NOTE — Telephone Encounter (Signed)
discussed with patient and she voiced understanding. Rx faxed      KP 

## 2011-05-15 ENCOUNTER — Telehealth: Payer: Self-pay | Admitting: *Deleted

## 2011-05-15 MED ORDER — CLARITHROMYCIN ER 500 MG PO TB24: 1000.0000 mg | ORAL_TABLET | Freq: Every day | ORAL | Status: AC

## 2011-05-15 NOTE — Telephone Encounter (Signed)
biaxin xl 2 po qd for 14 days #28 Hold cholesterol meds while taking meds

## 2011-05-15 NOTE — Telephone Encounter (Signed)
Lyda Jester made aware Rx faxed to the pharmacy and to hold Pravachol.     KP

## 2011-05-15 NOTE — Telephone Encounter (Signed)
Pt husband called stating that avelox 400 mg was to expensive at $65. Pt husband would like to get a cheaper Rx sent in to pharmacy. .Please advise

## 2011-07-31 ENCOUNTER — Other Ambulatory Visit: Payer: Self-pay | Admitting: Family Medicine

## 2011-07-31 MED ORDER — FLUOXETINE HCL 20 MG PO CAPS: 20.0000 mg | ORAL_CAPSULE | Freq: Every day | ORAL | Status: DC

## 2011-07-31 NOTE — Telephone Encounter (Signed)
refill for Fluoxetine 20MG  Cap Requesting 90-day supply Take one capsule by mouth daily  Last filled 9.1.2012  Last OV 1.24.2013

## 2011-09-21 ENCOUNTER — Ambulatory Visit (INDEPENDENT_AMBULATORY_CARE_PROVIDER_SITE_OTHER): Payer: Managed Care, Other (non HMO) | Admitting: Family Medicine

## 2011-09-21 ENCOUNTER — Encounter: Payer: Self-pay | Admitting: Family Medicine

## 2011-09-21 VITALS — BP 124/74 | HR 95 | Temp 98.3°F | Wt 131.6 lb

## 2011-09-21 DIAGNOSIS — J329 Chronic sinusitis, unspecified: Secondary | ICD-10-CM

## 2011-09-21 DIAGNOSIS — F419 Anxiety disorder, unspecified: Secondary | ICD-10-CM

## 2011-09-21 DIAGNOSIS — H6120 Impacted cerumen, unspecified ear: Secondary | ICD-10-CM

## 2011-09-21 DIAGNOSIS — E119 Type 2 diabetes mellitus without complications: Secondary | ICD-10-CM

## 2011-09-21 MED ORDER — ALPRAZOLAM 0.5 MG PO TABS: 0.5000 mg | ORAL_TABLET | Freq: Three times a day (TID) | ORAL | Status: DC | PRN

## 2011-09-21 MED ORDER — GLUCOSE BLOOD VI STRP: ORAL_STRIP | Status: AC

## 2011-09-21 MED ORDER — CLARITHROMYCIN ER 500 MG PO TB24: 1000.0000 mg | ORAL_TABLET | Freq: Every day | ORAL | Status: AC

## 2011-09-21 NOTE — Patient Instructions (Signed)
Serous Otitis Media   Serous otitis media is also known as otitis media with effusion (OME). It means there is fluid in the middle ear space. This space contains the bones for hearing and air. Air in the middle ear space helps to transmit sound.   The air gets there through the eustachian tube. This tube goes from the back of the throat to the middle ear space. It keeps the pressure in the middle ear the same as the outside world. It also helps to drain fluid from the middle ear space.  CAUSES   OME occurs when the eustachian tube gets blocked. Blockage can come from:   Ear infections.   Colds and other upper respiratory infections.   Allergies.   Irritants such as cigarette smoke.   Sudden changes in air pressure (such as descending in an airplane).   Enlarged adenoids.  During colds and upper respiratory infections, the middle ear space can become temporarily filled with fluid. This can happen after an ear infection also. Once the infection clears, the fluid will generally drain out of the ear through the eustachian tube. If it does not, then OME occurs.  SYMPTOMS    Hearing loss.   A feeling of fullness in the ear - but no pain.   Young children may not show any symptoms.  DIAGNOSIS    Diagnosis of OME is made by an ear exam.   Tests may be done to check on the movement of the eardrum.   Hearing exams may be done.  TREATMENT    The fluid most often goes away without treatment.   If allergy is the cause, allergy treatment may be helpful.   Fluid that persists for several months may require minor surgery. A small tube is placed in the ear drum to:   Drain the fluid.   Restore the air in the middle ear space.   In certain situations, antibiotics are used to avoid surgery.   Surgery may be done to remove enlarged adenoids (if this is the cause).  HOME CARE INSTRUCTIONS    Keep children away from tobacco smoke.   Be sure to keep follow up appointments, if any.  SEEK MEDICAL CARE IF:    Hearing is  not better in 3 months.   Hearing is worse.   Ear pain.   Drainage from the ear.   Dizziness.  Document Released: 06/13/2003 Document Revised: 03/12/2011 Document Reviewed: 04/12/2008  ExitCare Patient Information 2012 [employer].

## 2011-09-21 NOTE — Progress Notes (Signed)
  Subjective:     [name] is a 61 y.o. female who presents for evaluation of sinus pain. Symptoms include: congestion, facial pain, headaches, nasal congestion, sinus pressure and ear pressure . Onset of symptoms was 2 weeks ago. Symptoms have been gradually worsening since that time. Past history is significant for no history of pneumonia or bronchitis. Patient is a former smoker, .  The following portions of the patient's history were reviewed and updated as appropriate: allergies, current medications, past family history, past medical history, past social history, past surgical history and problem list.  Review of Systems Pertinent items are noted in HPI.   Objective:    BP 124/74  Pulse 95  Temp 98.3 F (36.8 C) (Oral)  Wt 131 lb 9.6 oz (59.693 kg)  SpO2 97% General appearance: alert, cooperative, appears stated age and no distress Ears: abnormal TM right ear - diminished mobility, erythematous, dull, bulging and air-fluid level and abnormal TM left ear - erythematous, dull, bulging and air-fluid level Nose: green discharge, moderate congestion, sinus tenderness bilateral Throat: lips, mucosa, and tongue normal; teeth and gums normal Neck: moderate anterior cervical adenopathy, supple, symmetrical, trachea midline and thyroid not enlarged, symmetric, no tenderness/mass/nodules Lungs: clear to auscultation bilaterally Heart: S1, S2 normal    Assessment:    Acute bacterial sinusitis. and b/l OM  Plan:    Nasal steroids per medication orders. Antihistamines per medication orders. Biaxin per medication orders. ears irrigated with good results

## 2011-09-22 ENCOUNTER — Ambulatory Visit: Payer: Managed Care, Other (non HMO) | Admitting: Family Medicine

## 2011-10-29 ENCOUNTER — Other Ambulatory Visit: Payer: Self-pay | Admitting: Family Medicine

## 2011-11-19 ENCOUNTER — Other Ambulatory Visit: Payer: Self-pay | Admitting: Family Medicine

## 2011-12-31 ENCOUNTER — Other Ambulatory Visit: Payer: Self-pay | Admitting: Family Medicine

## 2011-12-31 NOTE — Telephone Encounter (Signed)
Rx sent. Note says pt due for labs. Plz advise       MW

## 2012-01-13 ENCOUNTER — Other Ambulatory Visit: Payer: Self-pay | Admitting: Family Medicine

## 2012-02-16 ENCOUNTER — Encounter: Payer: Self-pay | Admitting: Family Medicine

## 2012-02-16 ENCOUNTER — Ambulatory Visit (INDEPENDENT_AMBULATORY_CARE_PROVIDER_SITE_OTHER): Payer: Managed Care, Other (non HMO) | Admitting: Family Medicine

## 2012-02-16 VITALS — BP 112/70 | HR 81 | Temp 98.0°F | Ht 59.5 in | Wt 124.2 lb

## 2012-02-16 DIAGNOSIS — Z0000 Encounter for general adult medical examination without abnormal findings: Secondary | ICD-10-CM

## 2012-02-16 DIAGNOSIS — E785 Hyperlipidemia, unspecified: Secondary | ICD-10-CM

## 2012-02-16 DIAGNOSIS — E119 Type 2 diabetes mellitus without complications: Secondary | ICD-10-CM

## 2012-02-16 DIAGNOSIS — Z2911 Encounter for prophylactic immunotherapy for respiratory syncytial virus (RSV): Secondary | ICD-10-CM

## 2012-02-16 DIAGNOSIS — Z23 Encounter for immunization: Secondary | ICD-10-CM

## 2012-02-16 DIAGNOSIS — Z780 Asymptomatic menopausal state: Secondary | ICD-10-CM

## 2012-02-16 DIAGNOSIS — E039 Hypothyroidism, unspecified: Secondary | ICD-10-CM

## 2012-02-16 LAB — CBC WITH DIFFERENTIAL/PLATELET
Eosinophils Absolute: 0.1 10*3/uL (ref 0.0–0.7)
MCHC: 33.3 g/dL (ref 30.0–36.0)
MCV: 93.5 fl (ref 78.0–100.0)
Monocytes Absolute: 0.6 10*3/uL (ref 0.1–1.0)
Neutrophils Relative %: 61.3 % (ref 43.0–77.0)
Platelets: 191 10*3/uL (ref 150.0–400.0)
RDW: 12.8 % (ref 11.5–14.6)
WBC: 6.5 10*3/uL (ref 4.5–10.5)

## 2012-02-16 LAB — POCT URINALYSIS DIPSTICK
Bilirubin, UA: NEGATIVE
Glucose, UA: NEGATIVE
Ketones, UA: NEGATIVE
Leukocytes, UA: NEGATIVE

## 2012-02-16 LAB — HEMOGLOBIN A1C: Hgb A1c MFr Bld: 10.4 % — ABNORMAL HIGH (ref 4.6–6.5)

## 2012-02-16 NOTE — Assessment & Plan Note (Signed)
Check labs 

## 2012-02-16 NOTE — Progress Notes (Signed)
Subjective:     [name] is a 61 y.o. female and is here for a comprehensive physical exam. The patient reports no problems.  History   Social History  . Marital Status: Married    Spouse Name: N/A    Number of Children: N/A  . Years of Education: N/A   Occupational History  . [employer]    Social History Main Topics  . Smoking status: Current Every Day Smoker -- 0.5 packs/day for 15 years  . Smokeless tobacco: Never Used  . Alcohol Use: No  . Drug Use: No  . Sexually Active: Yes -- Female partner(s)   Other Topics Concern  . Not on file   Social History Narrative   Exercise--  3 flts steps a day for 8 hours   Health Maintenance  Topic Date Due  . Foot Exam  07/04/1960  . Colonoscopy  07/04/2000  . Zostavax  07/05/2010  . Hemoglobin A1c  05/27/2011  . Urine Microalbumin  06/04/2011  . Ophthalmology Exam  08/18/2012  . Influenza Vaccine  12/05/2012  . Mammogram  02/15/2013  . Pneumococcal Polysaccharide Vaccine (#2) 01/05/2014  . Pap Smear  09/16/2014  . Tetanus/tdap  01/23/2017    The following portions of the patient's history were reviewed and updated as appropriate: allergies, current medications, past family history, past medical history, past social history, past surgical history and problem list.  Review of Systems Review of Systems  Constitutional: Negative for activity change, appetite change and fatigue.  HENT: Negative for hearing loss, congestion, tinnitus and ear discharge.  dentist q77m Eyes: Negative for visual disturbance (see optho q1y -- vision corrected to 20/20 with glasses).  Respiratory: Negative for cough, chest tightness and shortness of breath.   Cardiovascular: Negative for chest pain, palpitations and leg swelling.  Gastrointestinal: Negative for abdominal pain, diarrhea, constipation and abdominal distention.  Genitourinary: Negative for urgency, frequency, decreased urine volume and difficulty urinating.  Musculoskeletal: Negative  for back pain, arthralgias and gait problem.  Skin: Negative for color change, pallor and rash.  Neurological: Negative for dizziness, light-headedness, numbness and headaches.  Hematological: Negative for adenopathy. Does not bruise/bleed easily.  Psychiatric/Behavioral: Negative for suicidal ideas, confusion, sleep disturbance, self-injury, dysphoric mood, decreased concentration and agitation.       Objective:    There were no vitals taken for this visit. General appearance: alert, cooperative, appears stated age and no distress Head: Normocephalic, without obvious abnormality, atraumatic Eyes: conjunctivae/corneas clear. PERRL, EOM's intact. Fundi benign. Ears: normal TM's and external ear canals both ears Nose: Nares normal. Septum midline. Mucosa normal. No drainage or sinus tenderness. Throat: lips, mucosa, and tongue normal; teeth and gums normal Neck: no adenopathy, no carotid bruit, no JVD, supple, symmetrical, trachea midline and thyroid not enlarged, symmetric, no tenderness/mass/nodules Back: symmetric, no curvature. ROM normal. No CVA tenderness. Lungs: clear to auscultation bilaterally Breasts: normal appearance, no masses or tenderness Heart: regular rate and rhythm, S1, S2 normal, no murmur, click, rub or gallop Abdomen: soft, non-tender; bowel sounds normal; no masses,  no organomegaly Pelvic: cervix normal in appearance, external genitalia normal, no adnexal masses or tenderness, no cervical motion tenderness, rectovaginal septum normal, uterus normal size, shape, and consistency and vagina normal without discharge Extremities: extremities normal, atraumatic, no cyanosis or edema Pulses: 2+ and symmetric Skin: Skin color, texture, turgor normal. No rashes or lesions Lymph nodes: Cervical, supraclavicular, and axillary nodes normal. Neurologic: Alert and oriented X 3, normal strength and tone. Normal symmetric reflexes. Normal  coordination and gait psych-- no anxiety,  no depression    Assessment:    Healthy female exam.      Plan:    ghm utd Check fasting labs Colon ordered mammo per  Gyn bmd ordered See After Visit Summary for Counseling Recommendations

## 2012-02-16 NOTE — Assessment & Plan Note (Signed)
Check labs con't meds 

## 2012-02-16 NOTE — Addendum Note (Signed)
Addended by: Arnette Norris on: 02/16/2012 12:02 PM   Modules accepted: Orders

## 2012-02-16 NOTE — Patient Instructions (Signed)
Preventive Care for Adults, Female A healthy lifestyle and preventive care can promote health and wellness. Preventive health guidelines for women include the following key practices.  A routine yearly physical is a good way to check with your caregiver about your health and preventive screening. It is a chance to share any concerns and updates on your health, and to receive a thorough exam.  Visit your dentist for a routine exam and preventive care every 6 months. Brush your teeth twice a day and floss once a day. Good oral hygiene prevents tooth decay and gum disease.  The frequency of eye exams is based on your age, health, family medical history, use of contact lenses, and other factors. Follow your caregiver's recommendations for frequency of eye exams.  Eat a healthy diet. Foods like vegetables, fruits, whole grains, low-fat dairy products, and lean protein foods contain the nutrients you need without too many calories. Decrease your intake of foods high in solid fats, added sugars, and salt. Eat the right amount of calories for you.Get information about a proper diet from your caregiver, if necessary.  Regular physical exercise is one of the most important things you can do for your health. Most adults should get at least 150 minutes of moderate-intensity exercise (any activity that increases your heart rate and causes you to sweat) each week. In addition, most adults need muscle-strengthening exercises on 2 or more days a week.  Maintain a healthy weight. The body mass index (BMI) is a screening tool to identify possible weight problems. It provides an estimate of body fat based on height and weight. Your caregiver can help determine your BMI, and can help you achieve or maintain a healthy weight.For adults 20 years and older:  A BMI below 18.5 is considered underweight.  A BMI of 18.5 to 24.9 is normal.  A BMI of 25 to 29.9 is considered overweight.  A BMI of 30 and above is  considered obese.  Maintain normal blood lipids and cholesterol levels by exercising and minimizing your intake of saturated fat. Eat a balanced diet with plenty of fruit and vegetables. Blood tests for lipids and cholesterol should begin at age 20 and be repeated every 5 years. If your lipid or cholesterol levels are high, you are over 50, or you are at high risk for heart disease, you may need your cholesterol levels checked more frequently.Ongoing high lipid and cholesterol levels should be treated with medicines if diet and exercise are not effective.  If you smoke, find out from your caregiver how to quit. If you do not use tobacco, do not start.  If you are pregnant, do not drink alcohol. If you are breastfeeding, be very cautious about drinking alcohol. If you are not pregnant and choose to drink alcohol, do not exceed 1 drink per day. One drink is considered to be 12 ounces (355 mL) of beer, 5 ounces (148 mL) of wine, or 1.5 ounces (44 mL) of liquor.  Avoid use of street drugs. Do not share needles with anyone. Ask for help if you need support or instructions about stopping the use of drugs.  High blood pressure causes heart disease and increases the risk of stroke. Your blood pressure should be checked at least every 1 to 2 years. Ongoing high blood pressure should be treated with medicines if weight loss and exercise are not effective.  If you are 55 to 61 years old, ask your caregiver if you should take aspirin to prevent strokes.  Diabetes   screening involves taking a blood sample to check your fasting blood sugar level. This should be done once every 3 years, after age 45, if you are within normal weight and without risk factors for diabetes. Testing should be considered at a younger age or be carried out more frequently if you are overweight and have at least 1 risk factor for diabetes.  Breast cancer screening is essential preventive care for women. You should practice "breast  self-awareness." This means understanding the normal appearance and feel of your breasts and may include breast self-examination. Any changes detected, no matter how small, should be reported to a caregiver. Women in their 20s and 30s should have a clinical breast exam (CBE) by a caregiver as part of a regular health exam every 1 to 3 years. After age 40, women should have a CBE every year. Starting at age 40, women should consider having a mammography (breast X-ray test) every year. Women who have a family history of breast cancer should talk to their caregiver about genetic screening. Women at a high risk of breast cancer should talk to their caregivers about having magnetic resonance imaging (MRI) and a mammography every year.  The Pap test is a screening test for cervical cancer. A Pap test can show cell changes on the cervix that might become cervical cancer if left untreated. A Pap test is a procedure in which cells are obtained and examined from the lower end of the uterus (cervix).  Women should have a Pap test starting at age 21.  Between ages 21 and 29, Pap tests should be repeated every 2 years.  Beginning at age 30, you should have a Pap test every 3 years as long as the past 3 Pap tests have been normal.  Some women have medical problems that increase the chance of getting cervical cancer. Talk to your caregiver about these problems. It is especially important to talk to your caregiver if a new problem develops soon after your last Pap test. In these cases, your caregiver may recommend more frequent screening and Pap tests.  The above recommendations are the same for women who have or have not gotten the vaccine for human papillomavirus (HPV).  If you had a hysterectomy for a problem that was not cancer or a condition that could lead to cancer, then you no longer need Pap tests. Even if you no longer need a Pap test, a regular exam is a good idea to make sure no other problems are  starting.  If you are between ages 65 and 70, and you have had normal Pap tests going back 10 years, you no longer need Pap tests. Even if you no longer need a Pap test, a regular exam is a good idea to make sure no other problems are starting.  If you have had past treatment for cervical cancer or a condition that could lead to cancer, you need Pap tests and screening for cancer for at least 20 years after your treatment.  If Pap tests have been discontinued, risk factors (such as a new sexual partner) need to be reassessed to determine if screening should be resumed.  The HPV test is an additional test that may be used for cervical cancer screening. The HPV test looks for the virus that can cause the cell changes on the cervix. The cells collected during the Pap test can be tested for HPV. The HPV test could be used to screen women aged 30 years and older, and should   be used in women of any age who have unclear Pap test results. After the age of 30, women should have HPV testing at the same frequency as a Pap test.  Colorectal cancer can be detected and often prevented. Most routine colorectal cancer screening begins at the age of 50 and continues through age 75. However, your caregiver may recommend screening at an earlier age if you have risk factors for colon cancer. On a yearly basis, your caregiver may provide home test kits to check for hidden blood in the stool. Use of a small camera at the end of a tube, to directly examine the colon (sigmoidoscopy or colonoscopy), can detect the earliest forms of colorectal cancer. Talk to your caregiver about this at age 50, when routine screening begins. Direct examination of the colon should be repeated every 5 to 10 years through age 75, unless early forms of pre-cancerous polyps or small growths are found.  Hepatitis C blood testing is recommended for all people born from 1945 through 1965 and any individual with known risks for hepatitis C.  Practice  safe sex. Use condoms and avoid high-risk sexual practices to reduce the spread of sexually transmitted infections (STIs). STIs include gonorrhea, chlamydia, syphilis, trichomonas, herpes, HPV, and human immunodeficiency virus (HIV). Herpes, HIV, and HPV are viral illnesses that have no cure. They can result in disability, cancer, and death. Sexually active women aged 25 and younger should be checked for chlamydia. Older women with new or multiple partners should also be tested for chlamydia. Testing for other STIs is recommended if you are sexually active and at increased risk.  Osteoporosis is a disease in which the bones lose minerals and strength with aging. This can result in serious bone fractures. The risk of osteoporosis can be identified using a bone density scan. Women ages 65 and over and women at risk for fractures or osteoporosis should discuss screening with their caregivers. Ask your caregiver whether you should take a calcium supplement or vitamin D to reduce the rate of osteoporosis.  Menopause can be associated with physical symptoms and risks. Hormone replacement therapy is available to decrease symptoms and risks. You should talk to your caregiver about whether hormone replacement therapy is right for you.  Use sunscreen with sun protection factor (SPF) of 30 or more. Apply sunscreen liberally and repeatedly throughout the day. You should seek shade when your shadow is shorter than you. Protect yourself by wearing long sleeves, pants, a wide-brimmed hat, and sunglasses year round, whenever you are outdoors.  Once a month, do a whole body skin exam, using a mirror to look at the skin on your back. Notify your caregiver of new moles, moles that have irregular borders, moles that are larger than a pencil eraser, or moles that have changed in shape or color.  Stay current with required immunizations.  Influenza. You need a dose every fall (or winter). The composition of the flu vaccine  changes each year, so being vaccinated once is not enough.  Pneumococcal polysaccharide. You need 1 to 2 doses if you smoke cigarettes or if you have certain chronic medical conditions. You need 1 dose at age 65 (or older) if you have never been vaccinated.  Tetanus, diphtheria, pertussis (Tdap, Td). Get 1 dose of Tdap vaccine if you are younger than age 65, are over 65 and have contact with an infant, are a healthcare worker, are pregnant, or simply want to be protected from whooping cough. After that, you need a Td   booster dose every 10 years. Consult your caregiver if you have not had at least 3 tetanus and diphtheria-containing shots sometime in your life or have a deep or dirty wound.  HPV. You need this vaccine if you are a woman age 26 or younger. The vaccine is given in 3 doses over 6 months.  Measles, mumps, rubella (MMR). You need at least 1 dose of MMR if you were born in 1957 or later. You may also need a second dose.  Meningococcal. If you are age 19 to 21 and a first-year college student living in a residence hall, or have one of several medical conditions, you need to get vaccinated against meningococcal disease. You may also need additional booster doses.  Zoster (shingles). If you are age 60 or older, you should get this vaccine.  Varicella (chickenpox). If you have never had chickenpox or you were vaccinated but received only 1 dose, talk to your caregiver to find out if you need this vaccine.  Hepatitis A. You need this vaccine if you have a specific risk factor for hepatitis A virus infection or you simply wish to be protected from this disease. The vaccine is usually given as 2 doses, 6 to 18 months apart.  Hepatitis B. You need this vaccine if you have a specific risk factor for hepatitis B virus infection or you simply wish to be protected from this disease. The vaccine is given in 3 doses, usually over 6 months. Preventive Services / Frequency Ages 19 to 39  Blood  pressure check.** / Every 1 to 2 years.  Lipid and cholesterol check.** / Every 5 years beginning at age 20.  Clinical breast exam.** / Every 3 years for women in their 20s and 30s.  Pap test.** / Every 2 years from ages 21 through 29. Every 3 years starting at age 30 through age 65 or 70 with a history of 3 consecutive normal Pap tests.  HPV screening.** / Every 3 years from ages 30 through ages 65 to 70 with a history of 3 consecutive normal Pap tests.  Hepatitis C blood test.** / For any individual with known risks for hepatitis C.  Skin self-exam. / Monthly.  Influenza immunization.** / Every year.  Pneumococcal polysaccharide immunization.** / 1 to 2 doses if you smoke cigarettes or if you have certain chronic medical conditions.  Tetanus, diphtheria, pertussis (Tdap, Td) immunization. / A one-time dose of Tdap vaccine. After that, you need a Td booster dose every 10 years.  HPV immunization. / 3 doses over 6 months, if you are 26 and younger.  Measles, mumps, rubella (MMR) immunization. / You need at least 1 dose of MMR if you were born in 1957 or later. You may also need a second dose.  Meningococcal immunization. / 1 dose if you are age 19 to 21 and a first-year college student living in a residence hall, or have one of several medical conditions, you need to get vaccinated against meningococcal disease. You may also need additional booster doses.  Varicella immunization.** / Consult your caregiver.  Hepatitis A immunization.** / Consult your caregiver. 2 doses, 6 to 18 months apart.  Hepatitis B immunization.** / Consult your caregiver. 3 doses usually over 6 months. Ages 40 to 64  Blood pressure check.** / Every 1 to 2 years.  Lipid and cholesterol check.** / Every 5 years beginning at age 20.  Clinical breast exam.** / Every year after age 40.  Mammogram.** / Every year beginning at age 40   and continuing for as long as you are in good health. Consult with your  caregiver.  Pap test.** / Every 3 years starting at age 30 through age 65 or 70 with a history of 3 consecutive normal Pap tests.  HPV screening.** / Every 3 years from ages 30 through ages 65 to 70 with a history of 3 consecutive normal Pap tests.  Fecal occult blood test (FOBT) of stool. / Every year beginning at age 50 and continuing until age 75. You may not need to do this test if you get a colonoscopy every 10 years.  Flexible sigmoidoscopy or colonoscopy.** / Every 5 years for a flexible sigmoidoscopy or every 10 years for a colonoscopy beginning at age 50 and continuing until age 75.  Hepatitis C blood test.** / For all people born from 1945 through 1965 and any individual with known risks for hepatitis C.  Skin self-exam. / Monthly.  Influenza immunization.** / Every year.  Pneumococcal polysaccharide immunization.** / 1 to 2 doses if you smoke cigarettes or if you have certain chronic medical conditions.  Tetanus, diphtheria, pertussis (Tdap, Td) immunization.** / A one-time dose of Tdap vaccine. After that, you need a Td booster dose every 10 years.  Measles, mumps, rubella (MMR) immunization. / You need at least 1 dose of MMR if you were born in 1957 or later. You may also need a second dose.  Varicella immunization.** / Consult your caregiver.  Meningococcal immunization.** / Consult your caregiver.  Hepatitis A immunization.** / Consult your caregiver. 2 doses, 6 to 18 months apart.  Hepatitis B immunization.** / Consult your caregiver. 3 doses, usually over 6 months. Ages 65 and over  Blood pressure check.** / Every 1 to 2 years.  Lipid and cholesterol check.** / Every 5 years beginning at age 20.  Clinical breast exam.** / Every year after age 40.  Mammogram.** / Every year beginning at age 40 and continuing for as long as you are in good health. Consult with your caregiver.  Pap test.** / Every 3 years starting at age 30 through age 65 or 70 with a 3  consecutive normal Pap tests. Testing can be stopped between 65 and 70 with 3 consecutive normal Pap tests and no abnormal Pap or HPV tests in the past 10 years.  HPV screening.** / Every 3 years from ages 30 through ages 65 or 70 with a history of 3 consecutive normal Pap tests. Testing can be stopped between 65 and 70 with 3 consecutive normal Pap tests and no abnormal Pap or HPV tests in the past 10 years.  Fecal occult blood test (FOBT) of stool. / Every year beginning at age 50 and continuing until age 75. You may not need to do this test if you get a colonoscopy every 10 years.  Flexible sigmoidoscopy or colonoscopy.** / Every 5 years for a flexible sigmoidoscopy or every 10 years for a colonoscopy beginning at age 50 and continuing until age 75.  Hepatitis C blood test.** / For all people born from 1945 through 1965 and any individual with known risks for hepatitis C.  Osteoporosis screening.** / A one-time screening for women ages 65 and over and women at risk for fractures or osteoporosis.  Skin self-exam. / Monthly.  Influenza immunization.** / Every year.  Pneumococcal polysaccharide immunization.** / 1 dose at age 65 (or older) if you have never been vaccinated.  Tetanus, diphtheria, pertussis (Tdap, Td) immunization. / A one-time dose of Tdap vaccine if you are over   65 and have contact with an infant, are a healthcare worker, or simply want to be protected from whooping cough. After that, you need a Td booster dose every 10 years.  Varicella immunization.** / Consult your caregiver.  Meningococcal immunization.** / Consult your caregiver.  Hepatitis A immunization.** / Consult your caregiver. 2 doses, 6 to 18 months apart.  Hepatitis B immunization.** / Check with your caregiver. 3 doses, usually over 6 months. ** Family history and personal history of risk and conditions may change your caregiver's recommendations. Document Released: 05/19/2001 Document Revised: 06/15/2011  Document Reviewed: 08/18/2010 [employer] Patient Information 2013 [employer].  

## 2012-02-17 ENCOUNTER — Telehealth: Payer: Self-pay | Admitting: Family Medicine

## 2012-02-17 MED ORDER — INSULIN PEN NEEDLE 32G X 5 MM MISC: Status: DC

## 2012-02-17 MED ORDER — INSULIN GLARGINE 100 UNIT/ML ~~LOC~~ SOLN: 10.0000 [IU] | Freq: Every day | SUBCUTANEOUS | Status: DC

## 2012-02-17 NOTE — Telephone Encounter (Signed)
pt stated she requested insulin called Lantus solo star yesterday at her visit, she wants to know if sent to [facility]? i do not see on encounter sheet or meds listing  CB# [phone]

## 2012-02-17 NOTE — Telephone Encounter (Signed)
Notes Recorded by Lelon Perla, DO on 02/17/2012 at 7:28 AM lantus 10 u sq qpm For 3 months Recheck 3 months Notes Recorded by Arnette Norris, CMA on 02/16/2012 at 4:46 PM Patient did not want to see Endo she would rather try insulin since she was taking it before. She stated she was taking Lantus Solostar, she stopped the insulin because she thought her blood sugars were fine. If you send it could you send it to the [facility] on [address]. KP

## 2012-02-17 NOTE — Telephone Encounter (Signed)
Discussed with patient and she voiced understanding, Medication faxed to the pharmacy    KP

## 2012-02-18 ENCOUNTER — Encounter: Payer: Self-pay | Admitting: Gastroenterology

## 2012-02-25 ENCOUNTER — Ambulatory Visit (INDEPENDENT_AMBULATORY_CARE_PROVIDER_SITE_OTHER)
Admission: RE | Admit: 2012-02-25 | Discharge: 2012-02-25 | Disposition: A | Discharge status: Home / Self Care | Payer: Managed Care, Other (non HMO) | Source: Ambulatory Visit

## 2012-02-25 DIAGNOSIS — Z780 Asymptomatic menopausal state: Secondary | ICD-10-CM

## 2012-02-25 LAB — TSH: TSH: 0.56 u[IU]/mL (ref 0.35–5.50)

## 2012-02-25 LAB — LIPID PANEL
HDL: 41 mg/dL (ref >39.00)
LDL Cholesterol: 108 mg/dL — ABNORMAL HIGH (ref 0–99)
Total CHOL/HDL Ratio: 4
Triglycerides: 75 mg/dL (ref 0.0–149.0)

## 2012-02-25 LAB — BASIC METABOLIC PANEL
CO2: 29 mEq/L (ref 19–32)
Calcium: 9.2 mg/dL (ref 8.4–10.5)
Chloride: 104 mEq/L (ref 96–112)
Creatinine, Ser: 0.6 mg/dL (ref 0.4–1.2)
Sodium: 140 mEq/L (ref 135–145)

## 2012-02-25 LAB — HEPATIC FUNCTION PANEL
Alkaline Phosphatase: 76 U/L (ref 39–117)
Bilirubin, Direct: 0 mg/dL (ref 0.0–0.3)
Total Bilirubin: 0.6 mg/dL (ref 0.3–1.2)
Total Protein: 7.2 g/dL (ref 6.0–8.3)

## 2012-02-26 ENCOUNTER — Other Ambulatory Visit: Payer: Self-pay

## 2012-02-26 DIAGNOSIS — I10 Essential (primary) hypertension: Secondary | ICD-10-CM

## 2012-02-26 MED ORDER — LEVOTHYROXINE SODIUM 112 MCG PO TABS: 112.0000 ug | ORAL_TABLET | ORAL | Status: DC

## 2012-02-26 MED ORDER — HYDROCHLOROTHIAZIDE 25 MG PO TABS: 25.0000 mg | ORAL_TABLET | Freq: Every day | ORAL | Status: DC

## 2012-02-26 MED ORDER — LEVOTHYROXINE SODIUM 125 MCG PO TABS: 125.0000 ug | ORAL_TABLET | ORAL | Status: DC

## 2012-02-26 MED ORDER — FLUOXETINE HCL 20 MG PO CAPS: 20.0000 mg | ORAL_CAPSULE | Freq: Every day | ORAL | Status: DC

## 2012-02-26 NOTE — Telephone Encounter (Signed)
Patient called for her Thyroid results I advised they were WNL and she had also been made aware to restart Pravachol 20 mg and she voiced understanding and agreed.    KP

## 2012-02-29 ENCOUNTER — Telehealth: Payer: Self-pay | Admitting: Family Medicine

## 2012-02-29 MED ORDER — SAXAGLIPTIN HCL 5 MG PO TABS: 5.0000 mg | ORAL_TABLET | Freq: Every day | ORAL | Status: DC

## 2012-02-29 NOTE — Telephone Encounter (Signed)
Refill: Onglyza 5 mg tab. Take 1 tablet by mouth daily. Qty 30. Last fill 03-04-11

## 2012-03-28 ENCOUNTER — Other Ambulatory Visit: Payer: Self-pay | Admitting: Family Medicine

## 2012-03-28 NOTE — Telephone Encounter (Signed)
meds are not on pt's list and pharmacy has not filled.  No meds at this time.  Will need to wait and discuss w/ PCP

## 2012-03-28 NOTE — Telephone Encounter (Signed)
meds are not on pt's current med list. OK to refill? Please advise.

## 2012-03-28 NOTE — Telephone Encounter (Signed)
REFILLS X 2 -- per pharmacy they have not filled these before today's request  1-Fosamax & Acyclovir need all info we have not dispense for her before

## 2012-03-29 NOTE — Telephone Encounter (Signed)
See bmd-- I recommended she be on fosamax.   What does she need valtrex for?

## 2012-03-29 NOTE — Telephone Encounter (Signed)
Left message to call office with Pt husband. Called Pt to see why she is needing the valtrex .   Notes Recorded by Lelon Perla, DO on 03/09/2012 at 11:42 AM Osteoporosis--- Can try fosamax 70 mg weekly #4 11 refills Recheck 2 years

## 2012-04-04 ENCOUNTER — Telehealth: Payer: Self-pay | Admitting: *Deleted

## 2012-04-04 MED ORDER — VALACYCLOVIR HCL 1 G PO TABS: 1000.0000 mg | ORAL_TABLET | Freq: Two times a day (BID) | ORAL | Status: DC

## 2012-04-04 NOTE — Telephone Encounter (Signed)
They would normally give generic unless she means she wants acyclovir Otherwise valtrex ( generic)  1000mg  1 po tid for 10 days

## 2012-04-04 NOTE — Telephone Encounter (Signed)
Msg left advising Rx has been sent.      KP

## 2012-04-08 ENCOUNTER — Other Ambulatory Visit: Payer: Self-pay | Admitting: *Deleted

## 2012-04-08 DIAGNOSIS — M810 Age-related osteoporosis without current pathological fracture: Secondary | ICD-10-CM

## 2012-04-08 MED ORDER — ALENDRONATE SODIUM 70 MG PO TABS: 70.0000 mg | ORAL_TABLET | ORAL | Status: DC

## 2012-04-08 NOTE — Telephone Encounter (Signed)
Rx for Fosamax sent to [facility] on [address]

## 2012-04-12 ENCOUNTER — Encounter: Payer: Managed Care, Other (non HMO) | Admitting: Gastroenterology

## 2012-10-13 ENCOUNTER — Other Ambulatory Visit: Payer: Self-pay

## 2012-11-22 LAB — HM DIABETES EYE EXAM

## 2012-12-09 ENCOUNTER — Ambulatory Visit (INDEPENDENT_AMBULATORY_CARE_PROVIDER_SITE_OTHER): Payer: Managed Care, Other (non HMO) | Admitting: Family Medicine

## 2012-12-09 ENCOUNTER — Encounter: Payer: Self-pay | Admitting: Family Medicine

## 2012-12-09 VITALS — BP 112/72 | HR 93 | Temp 98.4°F | Wt 130.6 lb

## 2012-12-09 DIAGNOSIS — H60399 Other infective otitis externa, unspecified ear: Secondary | ICD-10-CM

## 2012-12-09 DIAGNOSIS — R300 Dysuria: Secondary | ICD-10-CM

## 2012-12-09 DIAGNOSIS — H6092 Unspecified otitis externa, left ear: Secondary | ICD-10-CM

## 2012-12-09 DIAGNOSIS — H6090 Unspecified otitis externa, unspecified ear: Secondary | ICD-10-CM | POA: Insufficient documentation

## 2012-12-09 DIAGNOSIS — F411 Generalized anxiety disorder: Secondary | ICD-10-CM

## 2012-12-09 DIAGNOSIS — N390 Urinary tract infection, site not specified: Secondary | ICD-10-CM

## 2012-12-09 DIAGNOSIS — F419 Anxiety disorder, unspecified: Secondary | ICD-10-CM

## 2012-12-09 DIAGNOSIS — I10 Essential (primary) hypertension: Secondary | ICD-10-CM

## 2012-12-09 LAB — POCT URINALYSIS DIPSTICK
Bilirubin, UA: NEGATIVE
Glucose, UA: NEGATIVE
Nitrite, UA: NEGATIVE
Urobilinogen, UA: 0.2

## 2012-12-09 MED ORDER — OFLOXACIN 0.3 % OT SOLN: 10.0000 [drp] | Freq: Every day | OTIC | Status: DC

## 2012-12-09 MED ORDER — HYDROCHLOROTHIAZIDE 25 MG PO TABS: 25.0000 mg | ORAL_TABLET | Freq: Every day | ORAL | Status: DC

## 2012-12-09 MED ORDER — CIPROFLOXACIN HCL 500 MG PO TABS: 500.0000 mg | ORAL_TABLET | Freq: Two times a day (BID) | ORAL | Status: AC

## 2012-12-09 MED ORDER — FLUOXETINE HCL 20 MG PO CAPS: 20.0000 mg | ORAL_CAPSULE | Freq: Every day | ORAL | Status: DC

## 2012-12-09 MED ORDER — SAXAGLIPTIN HCL 5 MG PO TABS: 5.0000 mg | ORAL_TABLET | Freq: Every day | ORAL | Status: DC

## 2012-12-09 MED ORDER — TRAMADOL HCL 50 MG PO TABS: 50.0000 mg | ORAL_TABLET | Freq: Three times a day (TID) | ORAL | Status: DC | PRN

## 2012-12-09 MED ORDER — CELECOXIB 200 MG PO CAPS: ORAL_CAPSULE | ORAL | Status: DC

## 2012-12-09 MED ORDER — ALPRAZOLAM 0.5 MG PO TABS: 0.5000 mg | ORAL_TABLET | Freq: Three times a day (TID) | ORAL | Status: DC | PRN

## 2012-12-09 NOTE — Addendum Note (Signed)
Addended by: Arnette Norris on: 12/09/2012 04:33 PM   Modules accepted: Orders

## 2012-12-09 NOTE — Assessment & Plan Note (Signed)
floxin otic 10 gtts qd

## 2012-12-09 NOTE — Progress Notes (Signed)
  Subjective:    [name] is a 62 y.o. female who complains of dysuria, frequency, suprapubic pressure and urgency. She has had symptoms for 3 days. Patient also complains of back pain. Patient denies congestion, cough, fever, headache, rhinitis, sorethroat and vaginal discharge. Patient does not have a history of recurrent UTI. Patient does not have a history of pyelonephritis.   The following portions of the patient's history were reviewed and updated as appropriate: allergies, current medications, past family history, past medical history, past social history, past surgical history and problem list.  Review of Systems Pertinent items are noted in HPI.    Objective:    BP 112/72  Pulse 93  Temp(Src) 98.4 F (36.9 C) (Oral)  Wt 130 lb 9.6 oz (59.24 kg)  BMI 25.95 kg/m2  SpO2 96% General appearance: alert, cooperative, appears stated age and no distress Abdomen: abnormal findings:  mild tenderness in the lower abdomen Ears-- L ear---canal errythematous and tm dull Laboratory:  Urine dipstick: mod for leukocyte esterase.   Micro exam: not done.    Assessment:    dysuria     Plan:    Medications: ciprofloxacin. Maintain adequate hydration. Follow up if symptoms not improving, and as needed.

## 2012-12-09 NOTE — Patient Instructions (Addendum)
Urinary Tract Infection  Urinary tract infections (UTIs) can develop anywhere along your urinary tract. Your urinary tract is your body's drainage system for removing wastes and extra water. Your urinary tract includes two kidneys, two ureters, a bladder, and a urethra. Your kidneys are a pair of bean-shaped organs. Each kidney is about the size of your fist. They are located below your ribs, one on each side of your spine.  CAUSES  Infections are caused by microbes, which are microscopic organisms, including fungi, viruses, and bacteria. These organisms are so small that they can only be seen through a microscope. Bacteria are the microbes that most commonly cause UTIs.  SYMPTOMS   Symptoms of UTIs may vary by age and gender of the patient and by the location of the infection. Symptoms in young women typically include a frequent and intense urge to urinate and a painful, burning feeling in the bladder or urethra during urination. Older women and men are more likely to be tired, shaky, and weak and have muscle aches and abdominal pain. A fever may mean the infection is in your kidneys. Other symptoms of a kidney infection include pain in your back or sides below the ribs, nausea, and vomiting.  DIAGNOSIS  To diagnose a UTI, your caregiver will ask you about your symptoms. Your caregiver also will ask to provide a urine sample. The urine sample will be tested for bacteria and white blood cells. White blood cells are made by your body to help fight infection.  TREATMENT   Typically, UTIs can be treated with medication. Because most UTIs are caused by a bacterial infection, they usually can be treated with the use of antibiotics. The choice of antibiotic and length of treatment depend on your symptoms and the type of bacteria causing your infection.  HOME CARE INSTRUCTIONS   If you were prescribed antibiotics, take them exactly as your caregiver instructs you. Finish the medication even if you feel better after you  have only taken some of the medication.   Drink enough water and fluids to keep your urine clear or pale yellow.   Avoid caffeine, tea, and carbonated beverages. They tend to irritate your bladder.   Empty your bladder often. Avoid holding urine for long periods of time.   Empty your bladder before and after sexual intercourse.   After a bowel movement, women should cleanse from front to back. Use each tissue only once.  SEEK MEDICAL CARE IF:    You have back pain.   You develop a fever.   Your symptoms do not begin to resolve within 3 days.  SEEK IMMEDIATE MEDICAL CARE IF:    You have severe back pain or lower abdominal pain.   You develop chills.   You have nausea or vomiting.   You have continued burning or discomfort with urination.  MAKE SURE YOU:    Understand these instructions.   Will watch your condition.   Will get help right away if you are not doing well or get worse.  Document Released: 12/31/2004 Document Revised: 09/22/2011 Document Reviewed: 05/01/2011  Exit[employer] Patient Information 2014 [employer].

## 2012-12-12 LAB — URINE CULTURE: Colony Count: >=100000

## 2013-01-10 ENCOUNTER — Encounter: Payer: Self-pay | Admitting: Family Medicine

## 2013-01-10 ENCOUNTER — Ambulatory Visit (INDEPENDENT_AMBULATORY_CARE_PROVIDER_SITE_OTHER): Payer: Managed Care, Other (non HMO) | Admitting: Family Medicine

## 2013-01-10 VITALS — BP 100/60 | HR 90 | Temp 98.7°F | Wt 128.6 lb

## 2013-01-10 DIAGNOSIS — H6690 Otitis media, unspecified, unspecified ear: Secondary | ICD-10-CM

## 2013-01-10 DIAGNOSIS — J209 Acute bronchitis, unspecified: Secondary | ICD-10-CM

## 2013-01-10 DIAGNOSIS — H6691 Otitis media, unspecified, right ear: Secondary | ICD-10-CM

## 2013-01-10 MED ORDER — AMOXICILLIN-POT CLAVULANATE 875-125 MG PO TABS: 1.0000 | ORAL_TABLET | Freq: Two times a day (BID) | ORAL | Status: DC

## 2013-01-10 MED ORDER — GUAIFENESIN-CODEINE 100-10 MG/5ML PO SYRP: 5.0000 mL | ORAL_SOLUTION | Freq: Three times a day (TID) | ORAL | Status: DC | PRN

## 2013-01-10 NOTE — Progress Notes (Signed)
  Subjective:     [name] is a 62 y.o. female here for evaluation of a cough. Onset of symptoms was several days ago. Symptoms have been gradually worsening since that time. The cough is productive and is aggravated by reclining position. Associated symptoms include: sputum production and wheezing. Patient does not have a history of asthma. Patient does have a history of environmental allergens. Patient has not traveled recently. Patient does have a history of smoking. Patient has not had a previous chest x-ray. Patient has not had a PPD done.  The following portions of the patient's history were reviewed and updated as appropriate: allergies, current medications, past family history, past medical history, past social history, past surgical history and problem list.  Review of Systems Pertinent items are noted in HPI.    Objective:    Oxygen saturation 96% on room air BP 100/60  Pulse 90  Temp(Src) 98.7 F (37.1 C) (Oral)  Wt 128 lb 9.6 oz (58.333 kg)  BMI 25.55 kg/m2  SpO2 96% General appearance: alert, cooperative, appears stated age and mild distress Ears: R ear + cerumen Nose: green discharge, moderate congestion, turbinates red, swollen, sinus tenderness bilateral Throat: lips, mucosa, and tongue normal; teeth and gums normal Neck: no adenopathy, no carotid bruit, no JVD, supple, symmetrical, trachea midline and thyroid not enlarged, symmetric, no tenderness/mass/nodules Lungs: diminished breath sounds RML and RUL Heart: S1, S2 normal    Assessment:    Acute Bronchitis    Plan:    Antibiotics per medication orders. Antitussives per medication orders. Avoid exposure to tobacco smoke and fumes. Call if shortness of breath worsens, blood in sputum, change in character of cough, development of fever or chills, inability to maintain nutrition and hydration. Avoid exposure to tobacco smoke and fumes.

## 2013-01-10 NOTE — Patient Instructions (Signed)

## 2013-01-31 ENCOUNTER — Telehealth: Payer: Self-pay | Admitting: Family Medicine

## 2013-01-31 MED ORDER — AZITHROMYCIN 250 MG PO TABS: ORAL_TABLET | ORAL | Status: DC

## 2013-01-31 NOTE — Telephone Encounter (Signed)
MSG left advising Rx sent.      KP

## 2013-01-31 NOTE — Telephone Encounter (Signed)
Please advise      KP 

## 2013-01-31 NOTE — Telephone Encounter (Signed)
Patient called and stated that she is still not feeling better from visit 01/10/2013 for a cough. Patient wanted to see was there something else dr Laury Axon could call her in like a z-pack? Thanks

## 2013-01-31 NOTE — Telephone Encounter (Signed)
zithromax #1 pack  As directed

## 2013-02-07 ENCOUNTER — Encounter: Payer: Self-pay | Admitting: Family Medicine

## 2013-02-07 ENCOUNTER — Ambulatory Visit (INDEPENDENT_AMBULATORY_CARE_PROVIDER_SITE_OTHER): Payer: Managed Care, Other (non HMO) | Admitting: Family Medicine

## 2013-02-07 VITALS — BP 126/80 | HR 82 | Temp 98.3°F | Wt 127.6 lb

## 2013-02-07 DIAGNOSIS — J209 Acute bronchitis, unspecified: Secondary | ICD-10-CM

## 2013-02-07 DIAGNOSIS — J441 Chronic obstructive pulmonary disease with (acute) exacerbation: Secondary | ICD-10-CM | POA: Insufficient documentation

## 2013-02-07 DIAGNOSIS — R05 Cough: Secondary | ICD-10-CM

## 2013-02-07 MED ORDER — IPRATROPIUM-ALBUTEROL 0.5-2.5 (3) MG/3ML IN SOLN
3.0000 mL | Freq: Once | RESPIRATORY_TRACT | Status: AC
  Administered 2013-02-07: 3 mL via RESPIRATORY_TRACT

## 2013-02-07 MED ORDER — PREDNISONE 10 MG PO TABS: ORAL_TABLET | ORAL | Status: DC

## 2013-02-07 MED ORDER — METHYLPREDNISOLONE ACETATE 80 MG/ML IJ SUSP
80.0000 mg | Freq: Once | INTRAMUSCULAR | Status: AC
  Administered 2013-02-07: 80 mg via INTRAMUSCULAR

## 2013-02-07 MED ORDER — MOXIFLOXACIN HCL 400 MG PO TABS: 400.0000 mg | ORAL_TABLET | Freq: Every day | ORAL | Status: DC

## 2013-02-07 MED ORDER — ALBUTEROL SULFATE HFA 108 (90 BASE) MCG/ACT IN AERS: INHALATION_SPRAY | RESPIRATORY_TRACT | Status: DC

## 2013-02-07 NOTE — Progress Notes (Signed)
  Subjective:     [name] is a 62 y.o. female here for evaluation of a cough. Onset of symptoms was a few weeks ago. Symptoms have been gradually worsening since that time. The cough is barky and is aggravated by cold air, infection and reclining position. Associated symptoms include: shortness of breath, sputum production and wheezing. Patient does not have a history of asthma ---+ copd.   Patient does have a history of environmental allergens. Patient has not traveled recently. Patient does have a history of smoking. Patient has not had a previous chest x-ray. Patient has not had a PPD done.  The following portions of the patient's history were reviewed and updated as appropriate: allergies, current medications, past family history, past medical history, past social history, past surgical history and problem list.  Review of Systems Pertinent items are noted in HPI.    Objective:    Oxygen saturation 90% on room air---- after neb 96% BP 126/80  Pulse 82  Temp(Src) 98.3 F (36.8 C) (Oral)  Wt 127 lb 9.6 oz (57.879 kg)  SpO2 90% General appearance: alert, cooperative, appears stated age and no distress Ears: normal TM's and external ear canals both ears Nose: Nares normal. Septum midline. Mucosa normal. No drainage or sinus tenderness. Throat: lips, mucosa, and tongue normal; teeth and gums normal Neck: no adenopathy, supple, symmetrical, trachea midline and thyroid not enlarged, symmetric, no tenderness/mass/nodules Lungs: diminished breath sounds bilaterally and wheezes bilaterally---after neb clear Heart: S1, S2 normal    Assessment:    Acute Bronchitis and COPD with exacerbation    Plan:    Antibiotics per medication orders. Avoid exposure to tobacco smoke and fumes. B-agonist inhaler. Call if shortness of breath worsens, blood in sputum, change in character of cough, development of fever or chills, inability to maintain nutrition and hydration. Avoid exposure to tobacco  smoke and fumes. Chest x-ray. depo medrol and pred taper

## 2013-02-07 NOTE — Patient Instructions (Signed)

## 2013-02-08 ENCOUNTER — Ambulatory Visit
Admission: RE | Admit: 2013-02-08 | Discharge: 2013-02-08 | Disposition: A | Discharge status: Home / Self Care | Payer: Managed Care, Other (non HMO) | Source: Ambulatory Visit | Attending: Family Medicine | Admitting: Family Medicine

## 2013-02-08 DIAGNOSIS — R05 Cough: Secondary | ICD-10-CM

## 2013-03-06 ENCOUNTER — Other Ambulatory Visit: Payer: Self-pay | Admitting: Family Medicine

## 2013-04-19 ENCOUNTER — Other Ambulatory Visit: Payer: Self-pay | Admitting: Family Medicine

## 2013-06-24 ENCOUNTER — Other Ambulatory Visit: Payer: Self-pay | Admitting: Family Medicine

## 2013-07-13 ENCOUNTER — Other Ambulatory Visit: Payer: Self-pay

## 2013-07-25 ENCOUNTER — Other Ambulatory Visit: Payer: Self-pay | Admitting: Family Medicine

## 2013-07-28 ENCOUNTER — Telehealth: Payer: Self-pay

## 2013-07-28 DIAGNOSIS — F419 Anxiety disorder, unspecified: Secondary | ICD-10-CM

## 2013-07-28 MED ORDER — ALPRAZOLAM 0.5 MG PO TABS: 0.5000 mg | ORAL_TABLET | Freq: Three times a day (TID) | ORAL | Status: DC | PRN

## 2013-07-28 NOTE — Telephone Encounter (Signed)
Refill x1 

## 2013-07-28 NOTE — Telephone Encounter (Signed)
Last seen 02/07/13 and filled 12/09/12 330 with 2 refills. No UDS and No contract  Please advise     KP

## 2013-07-28 NOTE — Telephone Encounter (Signed)
Rx faxed.    KP 

## 2013-09-20 ENCOUNTER — Telehealth: Payer: Self-pay | Admitting: Family Medicine

## 2013-09-20 NOTE — Telephone Encounter (Signed)
Caller name: Monetta  Call back number: [phone]   Reason for call:  Pt called back and wanted to know if we had a cancellation for Dr. Etter Sjogren.  Informed pt that we did not.  Offered apt with another physician here and also at [place].  Pt refused.  Offered to transfer pt to [facility] and pt refused.  Pt only wants to speak with RN in our office and no one else.

## 2013-09-20 NOTE — Telephone Encounter (Signed)
Patient called to schedule an apt for Bronchitis but we did not have any appointments available. I inform patient that she could go to [facility] urgent care or [facility]. Patient then proceeds to hang up the phone before I could offer her to speak with a triage nurse.

## 2013-09-20 NOTE — Telephone Encounter (Signed)
Severe headache, hoarse, chest tightness with some shortness of breath, and productive cough x 3 days.  Symptoms worsen yesterday after being at work.  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  Chest tightness and shortness of breath relieves with albuterol inhaler.    She stated that she would like to be seen today.  She was informed that Dr. Etter Sjogren did not have any appointments today, but that she was welcome to see one of our other providers.  Pt stated that she felt that she could wait until tomorrow.  Dr. Nonda Lou next available appointment is not until 1 pm tomorrow, which is a same day appointment.  She stated that she would like to take that appointment.  Pt was encouraged to call back after 4 pm to schedule this appointment.  Pt was agreeable and stated that she would.  Pt was advised not to wait, due to shortness of breath.  However, she stated again that she would wait.  Advice:  She was advised to continue to utilize albuterol as prescribed, take robitussin or cough syrup for the cough, tylenol for pain, drink plenty of fluids, and to avoid hot environments.  She was also highly advised not to wait until tomorrow if her symptoms worsen, but to instead go to urgent care or ER.  She agreed.

## 2013-09-21 ENCOUNTER — Encounter: Payer: Self-pay | Admitting: Family Medicine

## 2013-09-21 ENCOUNTER — Telehealth: Payer: Self-pay

## 2013-09-21 ENCOUNTER — Ambulatory Visit (INDEPENDENT_AMBULATORY_CARE_PROVIDER_SITE_OTHER): Payer: Managed Care, Other (non HMO) | Admitting: Family Medicine

## 2013-09-21 VITALS — BP 102/68 | HR 96 | Temp 98.9°F | Wt 116.0 lb

## 2013-09-21 DIAGNOSIS — J441 Chronic obstructive pulmonary disease with (acute) exacerbation: Secondary | ICD-10-CM

## 2013-09-21 MED ORDER — IPRATROPIUM-ALBUTEROL 0.5-2.5 (3) MG/3ML IN SOLN
3.0000 mL | Freq: Once | RESPIRATORY_TRACT | Status: AC
  Administered 2013-09-21: 3 mL via RESPIRATORY_TRACT

## 2013-09-21 MED ORDER — ALBUTEROL SULFATE HFA 108 (90 BASE) MCG/ACT IN AERS: INHALATION_SPRAY | RESPIRATORY_TRACT | Status: DC

## 2013-09-21 MED ORDER — AZITHROMYCIN 250 MG PO TABS
ORAL_TABLET | ORAL | Status: DC
Start: 2013-09-21 — End: 2014-02-19

## 2013-09-21 MED ORDER — PREDNISONE 10 MG PO TABS: ORAL_TABLET | ORAL | Status: DC

## 2013-09-21 MED ORDER — METHYLPREDNISOLONE ACETATE 80 MG/ML IJ SUSP
80.0000 mg | Freq: Once | INTRAMUSCULAR | Status: AC
  Administered 2013-09-21: 80 mg via INTRAMUSCULAR

## 2013-09-21 MED ORDER — PREDNISONE 10 MG PO TABS
ORAL_TABLET | ORAL | Status: DC
Start: 2013-09-21 — End: 2013-09-21

## 2013-09-21 NOTE — Patient Instructions (Signed)
Chronic Obstructive Pulmonary Disease Chronic obstructive pulmonary disease (COPD) is a common lung condition in which airflow from the lungs is limited. COPD is a general term that can be used to describe many different lung problems that limit airflow, including both chronic bronchitis and emphysema. If you have COPD, your lung function will probably never return to normal, but there are measures you can take to improve lung function and make yourself feel better.  CAUSES   Smoking (common).   Exposure to secondhand smoke.   Genetic problems.  Chronic inflammatory lung diseases or recurrent infections. SYMPTOMS   Shortness of breath, especially with physical activity.   Deep, persistent (chronic) cough with a large amount of thick mucus.   Wheezing.   Rapid breaths (tachypnea).   Gray or bluish discoloration (cyanosis) of the skin, especially in fingers, toes, or lips.   Fatigue.   Weight loss.   Frequent infections or episodes when breathing symptoms become much worse (exacerbations).   Chest tightness. DIAGNOSIS  Your healthcare provider will take a medical history and perform a physical examination to make the initial diagnosis. Additional tests for COPD may include:   Lung (pulmonary) function tests.  Chest X-ray.  CT scan.  Blood tests. TREATMENT  Treatment available to help you feel better when you have COPD include:   Inhaler and nebulizer medicines. These help manage the symptoms of COPD and make your breathing more comfortable  Supplemental oxygen. Supplemental oxygen is only helpful if you have a low oxygen level in your blood.   Exercise and physical activity. These are beneficial for nearly all people with COPD. Some people may also benefit from a pulmonary rehabilitation program. HOME CARE INSTRUCTIONS   Take all medicines (inhaled or pills) as directed by your health care provider.  Only take over-the-counter or prescription medicines  for pain, fever, or discomfort as directed by your health care provider.   Avoid over-the-counter medicines or cough syrups that dry up your airway (such as antihistamines) and slow down the elimination of secretions unless instructed otherwise by your healthcare provider.   If you are a smoker, the most important thing that you can do is stop smoking. Continuing to smoke will cause further lung damage and breathing trouble. Ask your health care provider for help with quitting smoking. He or she can direct you to community resources or hospitals that provide support.  Avoid exposure to irritants such as smoke, chemicals, and fumes that aggravate your breathing.  Use oxygen therapy and pulmonary rehabilitation if directed by your health care provider. If you require home oxygen therapy, ask your healthcare provider whether you should purchase a pulse oximeter to measure your oxygen level at home.   Avoid contact with individuals who have a contagious illness.  Avoid extreme temperature and humidity changes.  Eat healthy foods. Eating smaller, more frequent meals and resting before meals may help you maintain your strength.  Stay active, but balance activity with periods of rest. Exercise and physical activity will help you maintain your ability to do things you want to do.  Preventing infection and hospitalization is very important when you have COPD. Make sure to receive all the vaccines your health care provider recommends, especially the pneumococcal and influenza vaccines. Ask your healthcare provider whether you need a pneumonia vaccine.  Learn and use relaxation techniques to manage stress.  Learn and use controlled breathing techniques as directed by your health care provider. Controlled breathing techniques include:   Pursed lip breathing. Start by breathing   in (inhaling) through your nose for 1 second. Then, purse your lips as if you were going to whistle and breathe out (exhale)  through the pursed lips for 2 seconds.   Diaphragmatic breathing. Start by putting one hand on your abdomen just above your waist. Inhale slowly through your nose. The hand on your abdomen should move out. Then purse your lips and exhale slowly. You should be able to feel the hand on your abdomen moving in as you exhale.   Learn and use controlled coughing to clear mucus from your lungs. Controlled coughing is a series of short, progressive coughs. The steps of controlled coughing are:  1. Lean your head slightly forward.  2. Breathe in deeply using diaphragmatic breathing.  3. Try to hold your breath for 3 seconds.  4. Keep your mouth slightly open while coughing twice.  5. Spit any mucus out into a tissue.  6. Rest and repeat the steps once or twice as needed. SEEK MEDICAL CARE IF:   You are coughing up more mucus than usual.   There is a change in the color or thickness of your mucus.   Your breathing is more labored than usual.   Your breathing is faster than usual.  SEEK IMMEDIATE MEDICAL CARE IF:   You have shortness of breath while you are resting.   You have shortness of breath that prevents you from:  Being able to talk.   Performing your usual physical activities.   You have chest pain lasting longer than 5 minutes.   Your skin color is more cyanotic than usual.  You measure low oxygen saturations for longer than 5 minutes with a pulse oximeter. MAKE SURE YOU:   Understand these instructions.  Will watch your condition.  Will get help right away if you are not doing well or get worse. Document Released: 12/31/2004 Document Revised: 01/11/2013 Document Reviewed: 11/17/2012 Exit[employer] Patient Information 2015 [employer]. This information is not intended to replace advice given to you by your health care provider. Make sure you discuss any questions you have with your health care provider.  

## 2013-09-21 NOTE — Telephone Encounter (Signed)
Appointment scheduled for today at 1 pm with Dr. Etter Sjogren.

## 2013-09-21 NOTE — Telephone Encounter (Signed)
[place] to cancel ProAir and Prednisone prescriptions sent over earlier today.  Pharm tech stated that the prescriptions have already been cancelled.  No further actions required at this time.

## 2013-09-21 NOTE — Progress Notes (Signed)
  Subjective:     [name] is a 63 y.o. female here for evaluation of a cough. Onset of symptoms was 1 week ago. Symptoms have been gradually worsening since that time. The cough is dry and nonproductive and is aggravated by exercise, pollens and reclining position. Associated symptoms include: shortness of breath and wheezing. Patient does have a history of COPD.   Patient does have a history of environmental allergens. Patient has not traveled recently. Patient does have a history of smoking. Patient has not had a previous chest x-ray. Patient has not had a PPD done.  The following portions of the patient's history were reviewed and updated as appropriate: allergies, current medications, past family history, past medical history, past social history, past surgical history and problem list.  Review of Systems Pertinent items are noted in HPI.    Objective:    Oxygen saturation 95% on room air BP 102/68  Pulse 96  Temp(Src) 98.9 F (37.2 C) (Oral)  Wt 116 lb (52.617 kg)  SpO2 95% General appearance: alert, cooperative, appears stated age and no distress Ears: normal TM's and external ear canals both ears Nose: Nares normal. Septum midline. Mucosa normal. No drainage or sinus tenderness. Throat: lips, mucosa, and tongue normal; teeth and gums normal Neck: no adenopathy, supple, symmetrical, trachea midline and thyroid not enlarged, symmetric, no tenderness/mass/nodules Lungs: wheezes bilaterally Heart: S1, S2 normal Extremities: extremities normal, atraumatic, no cyanosis or edema    Assessment:    Acute Bronchitis and COPD with exacerbation    Plan:    Explained lack of efficacy of antibiotics in viral disease. Antitussives per medication orders. Avoid exposure to tobacco smoke and fumes. B-agonist inhaler. Call if shortness of breath worsens, blood in sputum, change in character of cough, development of fever or chills, inability to maintain nutrition and hydration. Avoid  exposure to tobacco smoke and fumes.

## 2013-09-21 NOTE — Progress Notes (Signed)
Pre visit review using our clinic review tool, if applicable. No additional management support is needed unless otherwise documented below in the visit note. 

## 2013-09-22 ENCOUNTER — Telehealth: Payer: Self-pay | Admitting: Family Medicine

## 2013-09-22 NOTE — Telephone Encounter (Signed)
Relevant patient education assigned to patient using Emmi. ° °

## 2013-10-05 ENCOUNTER — Other Ambulatory Visit: Payer: Self-pay | Admitting: Orthopaedic Surgery

## 2013-10-05 DIAGNOSIS — M961 Postlaminectomy syndrome, not elsewhere classified: Secondary | ICD-10-CM

## 2013-10-13 ENCOUNTER — Ambulatory Visit
Admission: RE | Admit: 2013-10-13 | Discharge: 2013-10-13 | Disposition: A | Discharge status: Home / Self Care | Payer: Managed Care, Other (non HMO) | Source: Ambulatory Visit | Attending: Orthopaedic Surgery | Admitting: Orthopaedic Surgery

## 2013-10-13 ENCOUNTER — Other Ambulatory Visit: Payer: Self-pay | Admitting: Orthopaedic Surgery

## 2013-10-13 VITALS — BP 119/81 | HR 76

## 2013-10-13 DIAGNOSIS — M549 Dorsalgia, unspecified: Secondary | ICD-10-CM

## 2013-10-13 DIAGNOSIS — M961 Postlaminectomy syndrome, not elsewhere classified: Secondary | ICD-10-CM

## 2013-10-13 MED ORDER — DIAZEPAM 5 MG PO TABS
5.0000 mg | ORAL_TABLET | Freq: Once | ORAL | Status: AC
  Administered 2013-10-13: 5 mg via ORAL

## 2013-10-13 MED ORDER — IOHEXOL 300 MG/ML  SOLN
10.0000 mL | Freq: Once | INTRAMUSCULAR | Status: AC | PRN
  Administered 2013-10-13: 10 mL via INTRATHECAL

## 2013-10-13 NOTE — Progress Notes (Signed)
Pt states she has been off tramadol for the past 2 days. Discharge instructions explained to pt.

## 2013-10-13 NOTE — Discharge Instructions (Signed)
Myelogram Discharge Instructions  1. Go home and rest quietly for the next 24 hours.  It is important to lie flat for the next 24 hours.  Get up only to go to the restroom.  You may lie in the bed or on a couch on your back, your stomach, your left side or your right side.  You may have one pillow under your head.  You may have pillows between your knees while you are on your side or under your knees while you are on your back.  2. DO NOT drive today.  Recline the seat as far back as it will go, while still wearing your seat belt, on the way home.  3. You may get up to go to the bathroom as needed.  You may sit up for 10 minutes to eat.  You may resume your normal diet and medications unless otherwise indicated.  Drink lots of extra fluids today and tomorrow.  4. The incidence of headache, nausea, or vomiting is about 5% (one in 20 patients).  If you develop a headache, lie flat and drink plenty of fluids until the headache goes away.  Caffeinated beverages may be helpful.  If you develop severe nausea and vomiting or a headache that does not go away with flat bed rest, call [phone].  5. You may resume normal activities after your 24 hours of bed rest is over; however, do not exert yourself strongly or do any heavy lifting tomorrow. If when you get up you have a headache when standing, go back to bed and force fluids for another 24 hours.  6. Call your physician for a follow-up appointment.  The results of your myelogram will be sent directly to your physician by the following day.  7. If you have any questions or if complications develop after you arrive home, please call [phone].  Discharge instructions have been explained to the patient.  The patient, or the person responsible for the patient, fully understands these instructions.      May resume Tramadol on October 14, 2013, after 1:00 pm.

## 2013-10-18 ENCOUNTER — Other Ambulatory Visit: Payer: Self-pay | Admitting: Family Medicine

## 2013-10-20 ENCOUNTER — Telehealth: Payer: Self-pay | Admitting: Family Medicine

## 2013-10-20 NOTE — Telephone Encounter (Signed)
Caller name: Astrid  Call back number:[phone]   Reason for call:   Pt is wanting to get her blood work done for her diabetes.  Pt states she needs it for her upcoming surgery.

## 2013-10-25 NOTE — Telephone Encounter (Signed)
No answer no voice mail  

## 2013-10-26 NOTE — Telephone Encounter (Signed)
Pt has CPE scheduled for Oct 7th, and is wanting a full script for the ONGLYZA 5 MG TABS tablet (30) Because the insurance charges more for half scripts (15).

## 2013-10-26 NOTE — Telephone Encounter (Signed)
Called Kristopher Oppenheim on [address] and advised to change onglyza Rx to #30 with 2 refills. Coverage until patient can get in for a physical.

## 2013-11-07 ENCOUNTER — Telehealth: Payer: Self-pay | Admitting: Family Medicine

## 2013-11-07 NOTE — Telephone Encounter (Signed)
Caller name: Makenleigh Relation to pt: Call back number:[phone] Pharmacy: [facility] on [address]   Reason for call: pt need her medication changed from ONGLYZA 5 MG TABS tablet to glimepiride (AMARYL) 2 MG tablet. She said she can't afford the Onglyza.

## 2013-11-07 NOTE — Telephone Encounter (Signed)
Please advise      KP 

## 2013-11-07 NOTE — Telephone Encounter (Signed)
amaryl 2 mg daily  #30  2 refills--  Pt needs to know it is a completely different med--- its not a generic for onglyza---- it can burn out the pancreas quicker and can bottom out blood sugars-- she may need to check them more often

## 2013-11-08 MED ORDER — GLIMEPIRIDE 2 MG PO TABS: 2.0000 mg | ORAL_TABLET | Freq: Every day | ORAL | Status: DC

## 2013-11-08 NOTE — Telephone Encounter (Signed)
Spoke with patient and she voiced understanding. She said she has taken the Amaryl in the past and id familiar with this drug. Rx sent.     KP

## 2013-11-08 NOTE — Telephone Encounter (Signed)
MSG left to call the office      KP 

## 2014-01-06 ENCOUNTER — Other Ambulatory Visit: Payer: Self-pay | Admitting: Family Medicine

## 2014-01-09 ENCOUNTER — Other Ambulatory Visit: Payer: Self-pay

## 2014-01-09 DIAGNOSIS — F419 Anxiety disorder, unspecified: Secondary | ICD-10-CM

## 2014-01-09 MED ORDER — ALPRAZOLAM 0.5 MG PO TABS: 0.5000 mg | ORAL_TABLET | Freq: Three times a day (TID) | ORAL | Status: DC | PRN

## 2014-01-09 MED ORDER — HYDROCHLOROTHIAZIDE 25 MG PO TABS: ORAL_TABLET | ORAL | Status: DC

## 2014-01-09 NOTE — Telephone Encounter (Signed)
Last seen 09/21/13 and filled 07/28/13 #30 no refills.  [place] patient  Please advise    KP

## 2014-01-10 ENCOUNTER — Encounter: Payer: Managed Care, Other (non HMO) | Admitting: Family Medicine

## 2014-02-19 ENCOUNTER — Encounter: Payer: Self-pay | Admitting: Family Medicine

## 2014-02-19 ENCOUNTER — Ambulatory Visit (INDEPENDENT_AMBULATORY_CARE_PROVIDER_SITE_OTHER): Payer: Managed Care, Other (non HMO) | Admitting: Family Medicine

## 2014-02-19 ENCOUNTER — Ambulatory Visit (HOSPITAL_BASED_OUTPATIENT_CLINIC_OR_DEPARTMENT_OTHER)
Admission: RE | Admit: 2014-02-19 | Discharge: 2014-02-19 | Disposition: A | Discharge status: Home / Self Care | Payer: Managed Care, Other (non HMO) | Source: Ambulatory Visit | Attending: Family Medicine | Admitting: Family Medicine

## 2014-02-19 ENCOUNTER — Other Ambulatory Visit (HOSPITAL_COMMUNITY)
Admission: RE | Admit: 2014-02-19 | Discharge: 2014-02-19 | Disposition: A | Discharge status: Home / Self Care | Payer: Managed Care, Other (non HMO) | Source: Ambulatory Visit | Attending: Family Medicine | Admitting: Family Medicine

## 2014-02-19 VITALS — BP 122/75 | HR 80 | Temp 98.1°F | Ht 60.0 in | Wt 120.0 lb

## 2014-02-19 DIAGNOSIS — R05 Cough: Secondary | ICD-10-CM

## 2014-02-19 DIAGNOSIS — E785 Hyperlipidemia, unspecified: Secondary | ICD-10-CM

## 2014-02-19 DIAGNOSIS — E2839 Other primary ovarian failure: Secondary | ICD-10-CM

## 2014-02-19 DIAGNOSIS — Z1239 Encounter for other screening for malignant neoplasm of breast: Secondary | ICD-10-CM

## 2014-02-19 DIAGNOSIS — R059 Cough, unspecified: Secondary | ICD-10-CM

## 2014-02-19 DIAGNOSIS — E1165 Type 2 diabetes mellitus with hyperglycemia: Secondary | ICD-10-CM

## 2014-02-19 DIAGNOSIS — Z01411 Encounter for gynecological examination (general) (routine) with abnormal findings: Secondary | ICD-10-CM | POA: Insufficient documentation

## 2014-02-19 DIAGNOSIS — Z0000 Encounter for general adult medical examination without abnormal findings: Secondary | ICD-10-CM

## 2014-02-19 DIAGNOSIS — Z23 Encounter for immunization: Secondary | ICD-10-CM

## 2014-02-19 DIAGNOSIS — Z1151 Encounter for screening for human papillomavirus (HPV): Secondary | ICD-10-CM | POA: Diagnosis present

## 2014-02-19 DIAGNOSIS — Z1231 Encounter for screening mammogram for malignant neoplasm of breast: Secondary | ICD-10-CM | POA: Diagnosis present

## 2014-02-19 DIAGNOSIS — Z124 Encounter for screening for malignant neoplasm of cervix: Secondary | ICD-10-CM

## 2014-02-19 DIAGNOSIS — IMO0002 Reserved for concepts with insufficient information to code with codable children: Secondary | ICD-10-CM

## 2014-02-19 DIAGNOSIS — E038 Other specified hypothyroidism: Secondary | ICD-10-CM

## 2014-02-19 LAB — CBC WITH DIFFERENTIAL/PLATELET
BASOS PCT: 0.3 % (ref 0.0–3.0)
Basophils Absolute: 0 10*3/uL (ref 0.0–0.1)
EOS ABS: 0.1 10*3/uL (ref 0.0–0.7)
Eosinophils Relative: 0.9 % (ref 0.0–5.0)
HEMATOCRIT: 45 % (ref 36.0–46.0)
HEMOGLOBIN: 14.6 g/dL (ref 12.0–15.0)
LYMPHS PCT: 18.8 % (ref 12.0–46.0)
Lymphs Abs: 1.6 10*3/uL (ref 0.7–4.0)
MCHC: 32.4 g/dL (ref 30.0–36.0)
MCV: 97.1 fl (ref 78.0–100.0)
Monocytes Absolute: 0.6 10*3/uL (ref 0.1–1.0)
Monocytes Relative: 6.6 % (ref 3.0–12.0)
NEUTROS ABS: 6.1 10*3/uL (ref 1.4–7.7)
Neutrophils Relative %: 73.4 % (ref 43.0–77.0)
Platelets: 210 10*3/uL (ref 150.0–400.0)
RBC: 4.63 Mil/uL (ref 3.87–5.11)
RDW: 13.4 % (ref 11.5–15.5)
WBC: 8.3 10*3/uL (ref 4.0–10.5)

## 2014-02-19 LAB — LIPID PANEL
CHOL/HDL RATIO: 4
Cholesterol: 188 mg/dL (ref 0–200)
HDL: 46.1 mg/dL (ref >39.00)
LDL Cholesterol: 125 mg/dL — ABNORMAL HIGH (ref 0–99)
NONHDL: 141.9
TRIGLYCERIDES: 86 mg/dL (ref 0.0–149.0)
VLDL: 17.2 mg/dL (ref 0.0–40.0)

## 2014-02-19 LAB — BASIC METABOLIC PANEL
BUN: 18 mg/dL (ref 6–23)
CHLORIDE: 106 meq/L (ref 96–112)
CO2: 29 meq/L (ref 19–32)
Calcium: 9.7 mg/dL (ref 8.4–10.5)
Creatinine, Ser: 0.7 mg/dL (ref 0.4–1.2)
GFR: 95.94 mL/min (ref >60.00)
GLUCOSE: 102 mg/dL — AB (ref 70–99)
POTASSIUM: 5.6 meq/L — AB (ref 3.5–5.1)
SODIUM: 145 meq/L (ref 135–145)

## 2014-02-19 LAB — POCT URINALYSIS DIPSTICK
Bilirubin, UA: NEGATIVE
Glucose, UA: NEGATIVE
Ketones, UA: NEGATIVE
LEUKOCYTES UA: NEGATIVE
NITRITE UA: NEGATIVE
PH UA: 5.5
Protein, UA: NEGATIVE
RBC UA: NEGATIVE
Spec Grav, UA: >=1.03
UROBILINOGEN UA: 0.2

## 2014-02-19 LAB — MICROALBUMIN / CREATININE URINE RATIO
CREATININE, U: 94.1 mg/dL
Microalb Creat Ratio: 0.4 mg/g (ref 0.0–30.0)
Microalb, Ur: 0.4 mg/dL (ref 0.0–1.9)

## 2014-02-19 LAB — TSH: TSH: 6.3 u[IU]/mL — ABNORMAL HIGH (ref 0.35–4.50)

## 2014-02-19 LAB — HEPATIC FUNCTION PANEL
ALK PHOS: 72 U/L (ref 39–117)
ALT: 20 U/L (ref 0–35)
AST: 22 U/L (ref 0–37)
Albumin: 4.2 g/dL (ref 3.5–5.2)
BILIRUBIN DIRECT: 0 mg/dL (ref 0.0–0.3)
Total Bilirubin: 0.5 mg/dL (ref 0.2–1.2)
Total Protein: 7.7 g/dL (ref 6.0–8.3)

## 2014-02-19 LAB — HEMOGLOBIN A1C: Hgb A1c MFr Bld: 7.5 % — ABNORMAL HIGH (ref 4.6–6.5)

## 2014-02-19 NOTE — Addendum Note (Signed)
Addended by: Ewing Schlein on: 02/19/2014 04:18 PM   Modules accepted: Orders

## 2014-02-19 NOTE — Patient Instructions (Signed)
Preventive Care for Adults A healthy lifestyle and preventive care can promote health and wellness. Preventive health guidelines for women include the following key practices.  A routine yearly physical is a good way to check with your health care provider about your health and preventive screening. It is a chance to share any concerns and updates on your health and to receive a thorough exam.  Visit your dentist for a routine exam and preventive care every 6 months. Brush your teeth twice a day and floss once a day. Good oral hygiene prevents tooth decay and gum disease.  The frequency of eye exams is based on your age, health, family medical history, use of contact lenses, and other factors. Follow your health care provider's recommendations for frequency of eye exams.  Eat a healthy diet. Foods like vegetables, fruits, whole grains, low-fat dairy products, and lean protein foods contain the nutrients you need without too many calories. Decrease your intake of foods high in solid fats, added sugars, and salt. Eat the right amount of calories for you.Get information about a proper diet from your health care provider, if necessary.  Regular physical exercise is one of the most important things you can do for your health. Most adults should get at least 150 minutes of moderate-intensity exercise (any activity that increases your heart rate and causes you to sweat) each week. In addition, most adults need muscle-strengthening exercises on 2 or more days a week.  Maintain a healthy weight. The body mass index (BMI) is a screening tool to identify possible weight problems. It provides an estimate of body fat based on height and weight. Your health care provider can find your BMI and can help you achieve or maintain a healthy weight.For adults 20 years and older:  A BMI below 18.5 is considered underweight.  A BMI of 18.5 to 24.9 is normal.  A BMI of 25 to 29.9 is considered overweight.  A BMI of  30 and above is considered obese.  Maintain normal blood lipids and cholesterol levels by exercising and minimizing your intake of saturated fat. Eat a balanced diet with plenty of fruit and vegetables. Blood tests for lipids and cholesterol should begin at age 76 and be repeated every 5 years. If your lipid or cholesterol levels are high, you are over 50, or you are at high risk for heart disease, you may need your cholesterol levels checked more frequently.Ongoing high lipid and cholesterol levels should be treated with medicines if diet and exercise are not working.  If you smoke, find out from your health care provider how to quit. If you do not use tobacco, do not start.  Lung cancer screening is recommended for adults aged 22-80 years who are at high risk for developing lung cancer because of a history of smoking. A yearly low-dose CT scan of the lungs is recommended for people who have at least a 30-pack-year history of smoking and are a current smoker or have quit within the past 15 years. A pack year of smoking is smoking an average of 1 pack of cigarettes a day for 1 year (for example: 1 pack a day for 30 years or 2 packs a day for 15 years). Yearly screening should continue until the smoker has stopped smoking for at least 15 years. Yearly screening should be stopped for people who develop a health problem that would prevent them from having lung cancer treatment.  If you are pregnant, do not drink alcohol. If you are breastfeeding,  be very cautious about drinking alcohol. If you are not pregnant and choose to drink alcohol, do not have more than 1 drink per day. One drink is considered to be 12 ounces (355 mL) of beer, 5 ounces (148 mL) of wine, or 1.5 ounces (44 mL) of liquor.  Avoid use of street drugs. Do not share needles with anyone. Ask for help if you need support or instructions about stopping the use of drugs.  High blood pressure causes heart disease and increases the risk of  stroke. Your blood pressure should be checked at least every 1 to 2 years. Ongoing high blood pressure should be treated with medicines if weight loss and exercise do not work.  If you are 75-52 years old, ask your health care provider if you should take aspirin to prevent strokes.  Diabetes screening involves taking a blood sample to check your fasting blood sugar level. This should be done once every 3 years, after age 15, if you are within normal weight and without risk factors for diabetes. Testing should be considered at a younger age or be carried out more frequently if you are overweight and have at least 1 risk factor for diabetes.  Breast cancer screening is essential preventive care for women. You should practice "breast self-awareness." This means understanding the normal appearance and feel of your breasts and may include breast self-examination. Any changes detected, no matter how small, should be reported to a health care provider. Women in their 58s and 30s should have a clinical breast exam (CBE) by a health care provider as part of a regular health exam every 1 to 3 years. After age 16, women should have a CBE every year. Starting at age 53, women should consider having a mammogram (breast X-ray test) every year. Women who have a family history of breast cancer should talk to their health care provider about genetic screening. Women at a high risk of breast cancer should talk to their health care providers about having an MRI and a mammogram every year.  Breast cancer gene (BRCA)-related cancer risk assessment is recommended for women who have family members with BRCA-related cancers. BRCA-related cancers include breast, ovarian, tubal, and peritoneal cancers. Having family members with these cancers may be associated with an increased risk for harmful changes (mutations) in the breast cancer genes BRCA1 and BRCA2. Results of the assessment will determine the need for genetic counseling and  BRCA1 and BRCA2 testing.  Routine pelvic exams to screen for cancer are no longer recommended for nonpregnant women who are considered low risk for cancer of the pelvic organs (ovaries, uterus, and vagina) and who do not have symptoms. Ask your health care provider if a screening pelvic exam is right for you.  If you have had past treatment for cervical cancer or a condition that could lead to cancer, you need Pap tests and screening for cancer for at least 20 years after your treatment. If Pap tests have been discontinued, your risk factors (such as having a new sexual partner) need to be reassessed to determine if screening should be resumed. Some women have medical problems that increase the chance of getting cervical cancer. In these cases, your health care provider may recommend more frequent screening and Pap tests.  The HPV test is an additional test that may be used for cervical cancer screening. The HPV test looks for the virus that can cause the cell changes on the cervix. The cells collected during the Pap test can be  tested for HPV. The HPV test could be used to screen women aged 30 years and older, and should be used in women of any age who have unclear Pap test results. After the age of 30, women should have HPV testing at the same frequency as a Pap test.  Colorectal cancer can be detected and often prevented. Most routine colorectal cancer screening begins at the age of 50 years and continues through age 75 years. However, your health care provider may recommend screening at an earlier age if you have risk factors for colon cancer. On a yearly basis, your health care provider may provide home test kits to check for hidden blood in the stool. Use of a small camera at the end of a tube, to directly examine the colon (sigmoidoscopy or colonoscopy), can detect the earliest forms of colorectal cancer. Talk to your health care provider about this at age 50, when routine screening begins. Direct  exam of the colon should be repeated every 5-10 years through age 75 years, unless early forms of pre-cancerous polyps or small growths are found.  People who are at an increased risk for hepatitis B should be screened for this virus. You are considered at high risk for hepatitis B if:  You were born in a country where hepatitis B occurs often. Talk with your health care provider about which countries are considered high risk.  Your parents were born in a high-risk country and you have not received a shot to protect against hepatitis B (hepatitis B vaccine).  You have HIV or AIDS.  You use needles to inject street drugs.  You live with, or have sex with, someone who has hepatitis B.  You get hemodialysis treatment.  You take certain medicines for conditions like cancer, organ transplantation, and autoimmune conditions.  Hepatitis C blood testing is recommended for all people born from 1945 through 1965 and any individual with known risks for hepatitis C.  Practice safe sex. Use condoms and avoid high-risk sexual practices to reduce the spread of sexually transmitted infections (STIs). STIs include gonorrhea, chlamydia, syphilis, trichomonas, herpes, HPV, and human immunodeficiency virus (HIV). Herpes, HIV, and HPV are viral illnesses that have no cure. They can result in disability, cancer, and death.  You should be screened for sexually transmitted illnesses (STIs) including gonorrhea and chlamydia if:  You are sexually active and are younger than 24 years.  You are older than 24 years and your health care provider tells you that you are at risk for this type of infection.  Your sexual activity has changed since you were last screened and you are at an increased risk for chlamydia or gonorrhea. Ask your health care provider if you are at risk.  If you are at risk of being infected with HIV, it is recommended that you take a prescription medicine daily to prevent HIV infection. This is  called preexposure prophylaxis (PrEP). You are considered at risk if:  You are a heterosexual woman, are sexually active, and are at increased risk for HIV infection.  You take drugs by injection.  You are sexually active with a partner who has HIV.  Talk with your health care provider about whether you are at high risk of being infected with HIV. If you choose to begin PrEP, you should first be tested for HIV. You should then be tested every 3 months for as long as you are taking PrEP.  Osteoporosis is a disease in which the bones lose minerals and strength   with aging. This can result in serious bone fractures or breaks. The risk of osteoporosis can be identified using a bone density scan. Women ages 65 years and over and women at risk for fractures or osteoporosis should discuss screening with their health care providers. Ask your health care provider whether you should take a calcium supplement or vitamin D to reduce the rate of osteoporosis.  Menopause can be associated with physical symptoms and risks. Hormone replacement therapy is available to decrease symptoms and risks. You should talk to your health care provider about whether hormone replacement therapy is right for you.  Use sunscreen. Apply sunscreen liberally and repeatedly throughout the day. You should seek shade when your shadow is shorter than you. Protect yourself by wearing long sleeves, pants, a wide-brimmed hat, and sunglasses year round, whenever you are outdoors.  Once a month, do a whole body skin exam, using a mirror to look at the skin on your back. Tell your health care provider of new moles, moles that have irregular borders, moles that are larger than a pencil eraser, or moles that have changed in shape or color.  Stay current with required vaccines (immunizations).  Influenza vaccine. All adults should be immunized every year.  Tetanus, diphtheria, and acellular pertussis (Td, Tdap) vaccine. Pregnant women should  receive 1 dose of Tdap vaccine during each pregnancy. The dose should be obtained regardless of the length of time since the last dose. Immunization is preferred during the 27th-36th week of gestation. An adult who has not previously received Tdap or who does not know her vaccine status should receive 1 dose of Tdap. This initial dose should be followed by tetanus and diphtheria toxoids (Td) booster doses every 10 years. Adults with an unknown or incomplete history of completing a 3-dose immunization series with Td-containing vaccines should begin or complete a primary immunization series including a Tdap dose. Adults should receive a Td booster every 10 years.  Varicella vaccine. An adult without evidence of immunity to varicella should receive 2 doses or a second dose if she has previously received 1 dose. Pregnant females who do not have evidence of immunity should receive the first dose after pregnancy. This first dose should be obtained before leaving the health care facility. The second dose should be obtained 4-8 weeks after the first dose.  Human papillomavirus (HPV) vaccine. Females aged 13-26 years who have not received the vaccine previously should obtain the 3-dose series. The vaccine is not recommended for use in pregnant females. However, pregnancy testing is not needed before receiving a dose. If a female is found to be pregnant after receiving a dose, no treatment is needed. In that case, the remaining doses should be delayed until after the pregnancy. Immunization is recommended for any person with an immunocompromised condition through the age of 26 years if she did not get any or all doses earlier. During the 3-dose series, the second dose should be obtained 4-8 weeks after the first dose. The third dose should be obtained 24 weeks after the first dose and 16 weeks after the second dose.  Zoster vaccine. One dose is recommended for adults aged 60 years or older unless certain conditions are  present.  Measles, mumps, and rubella (MMR) vaccine. Adults born before 1957 generally are considered immune to measles and mumps. Adults born in 1957 or later should have 1 or more doses of MMR vaccine unless there is a contraindication to the vaccine or there is laboratory evidence of immunity to   each of the three diseases. A routine second dose of MMR vaccine should be obtained at least 28 days after the first dose for students attending postsecondary schools, health care workers, or international travelers. People who received inactivated measles vaccine or an unknown type of measles vaccine during 1963-1967 should receive 2 doses of MMR vaccine. People who received inactivated mumps vaccine or an unknown type of mumps vaccine before 1979 and are at high risk for mumps infection should consider immunization with 2 doses of MMR vaccine. For females of childbearing age, rubella immunity should be determined. If there is no evidence of immunity, females who are not pregnant should be vaccinated. If there is no evidence of immunity, females who are pregnant should delay immunization until after pregnancy. Unvaccinated health care workers born before 1957 who lack laboratory evidence of measles, mumps, or rubella immunity or laboratory confirmation of disease should consider measles and mumps immunization with 2 doses of MMR vaccine or rubella immunization with 1 dose of MMR vaccine.  Pneumococcal 13-valent conjugate (PCV13) vaccine. When indicated, a person who is uncertain of her immunization history and has no record of immunization should receive the PCV13 vaccine. An adult aged 19 years or older who has certain medical conditions and has not been previously immunized should receive 1 dose of PCV13 vaccine. This PCV13 should be followed with a dose of pneumococcal polysaccharide (PPSV23) vaccine. The PPSV23 vaccine dose should be obtained at least 8 weeks after the dose of PCV13 vaccine. An adult aged 19  years or older who has certain medical conditions and previously received 1 or more doses of PPSV23 vaccine should receive 1 dose of PCV13. The PCV13 vaccine dose should be obtained 1 or more years after the last PPSV23 vaccine dose.  Pneumococcal polysaccharide (PPSV23) vaccine. When PCV13 is also indicated, PCV13 should be obtained first. All adults aged 65 years and older should be immunized. An adult younger than age 65 years who has certain medical conditions should be immunized. Any person who resides in a nursing home or long-term care facility should be immunized. An adult smoker should be immunized. People with an immunocompromised condition and certain other conditions should receive both PCV13 and PPSV23 vaccines. People with human immunodeficiency virus (HIV) infection should be immunized as soon as possible after diagnosis. Immunization during chemotherapy or radiation therapy should be avoided. Routine use of PPSV23 vaccine is not recommended for American Indians, [place] Natives, or people younger than 65 years unless there are medical conditions that require PPSV23 vaccine. When indicated, people who have unknown immunization and have no record of immunization should receive PPSV23 vaccine. One-time revaccination 5 years after the first dose of PPSV23 is recommended for people aged 19-64 years who have chronic kidney failure, nephrotic syndrome, asplenia, or immunocompromised conditions. People who received 1-2 doses of PPSV23 before age 65 years should receive another dose of PPSV23 vaccine at age 65 years or later if at least 5 years have passed since the previous dose. Doses of PPSV23 are not needed for people immunized with PPSV23 at or after age 65 years.  Meningococcal vaccine. Adults with asplenia or persistent complement component deficiencies should receive 2 doses of quadrivalent meningococcal conjugate (MenACWY-D) vaccine. The doses should be obtained at least 2 months apart.  Microbiologists working with certain meningococcal bacteria, military recruits, people at risk during an outbreak, and people who travel to or live in countries with a high rate of meningitis should be immunized. A first-year college student up through age   21 years who is living in a residence hall should receive a dose if she did not receive a dose on or after her 16th birthday. Adults who have certain high-risk conditions should receive one or more doses of vaccine.  Hepatitis A vaccine. Adults who wish to be protected from this disease, have certain high-risk conditions, work with hepatitis A-infected animals, work in hepatitis A research labs, or travel to or work in countries with a high rate of hepatitis A should be immunized. Adults who were previously unvaccinated and who anticipate close contact with an international adoptee during the first 60 days after arrival in the [place] from a country with a high rate of hepatitis A should be immunized.  Hepatitis B vaccine. Adults who wish to be protected from this disease, have certain high-risk conditions, may be exposed to blood or other infectious body fluids, are household contacts or sex partners of hepatitis B positive people, are clients or workers in certain care facilities, or travel to or work in countries with a high rate of hepatitis B should be immunized.  Haemophilus influenzae type b (Hib) vaccine. A previously unvaccinated person with asplenia or sickle cell disease or having a scheduled splenectomy should receive 1 dose of Hib vaccine. Regardless of previous immunization, a recipient of a hematopoietic stem cell transplant should receive a 3-dose series 6-12 months after her successful transplant. Hib vaccine is not recommended for adults with HIV infection. Preventive Services / Frequency Ages 64 to 68 years  Blood pressure check.** / Every 1 to 2 years.  Lipid and cholesterol check.** / Every 5 years beginning at age  22.  Clinical breast exam.** / Every 3 years for women in their 88s and 53s.  BRCA-related cancer risk assessment.** / For women who have family members with a BRCA-related cancer (breast, ovarian, tubal, or peritoneal cancers).  Pap test.** / Every 2 years from ages 90 through 51. Every 3 years starting at age 21 through age 56 or 3 with a history of 3 consecutive normal Pap tests.  HPV screening.** / Every 3 years from ages 24 through ages 1 to 46 with a history of 3 consecutive normal Pap tests.  Hepatitis C blood test.** / For any individual with known risks for hepatitis C.  Skin self-exam. / Monthly.  Influenza vaccine. / Every year.  Tetanus, diphtheria, and acellular pertussis (Tdap, Td) vaccine.** / Consult your health care provider. Pregnant women should receive 1 dose of Tdap vaccine during each pregnancy. 1 dose of Td every 10 years.  Varicella vaccine.** / Consult your health care provider. Pregnant females who do not have evidence of immunity should receive the first dose after pregnancy.  HPV vaccine. / 3 doses over 6 months, if 72 and younger. The vaccine is not recommended for use in pregnant females. However, pregnancy testing is not needed before receiving a dose.  Measles, mumps, rubella (MMR) vaccine.** / You need at least 1 dose of MMR if you were born in 1957 or later. You may also need a 2nd dose. For females of childbearing age, rubella immunity should be determined. If there is no evidence of immunity, females who are not pregnant should be vaccinated. If there is no evidence of immunity, females who are pregnant should delay immunization until after pregnancy.  Pneumococcal 13-valent conjugate (PCV13) vaccine.** / Consult your health care provider.  Pneumococcal polysaccharide (PPSV23) vaccine.** / 1 to 2 doses if you smoke cigarettes or if you have certain conditions.  Meningococcal vaccine.** /  1 dose if you are age 19 to 21 years and a first-year college  student living in a residence hall, or have one of several medical conditions, you need to get vaccinated against meningococcal disease. You may also need additional booster doses.  Hepatitis A vaccine.** / Consult your health care provider.  Hepatitis B vaccine.** / Consult your health care provider.  Haemophilus influenzae type b (Hib) vaccine.** / Consult your health care provider. Ages 40 to 64 years  Blood pressure check.** / Every 1 to 2 years.  Lipid and cholesterol check.** / Every 5 years beginning at age 20 years.  Lung cancer screening. / Every year if you are aged 55-80 years and have a 30-pack-year history of smoking and currently smoke or have quit within the past 15 years. Yearly screening is stopped once you have quit smoking for at least 15 years or develop a health problem that would prevent you from having lung cancer treatment.  Clinical breast exam.** / Every year after age 40 years.  BRCA-related cancer risk assessment.** / For women who have family members with a BRCA-related cancer (breast, ovarian, tubal, or peritoneal cancers).  Mammogram.** / Every year beginning at age 40 years and continuing for as long as you are in good health. Consult with your health care provider.  Pap test.** / Every 3 years starting at age 30 years through age 65 or 70 years with a history of 3 consecutive normal Pap tests.  HPV screening.** / Every 3 years from ages 30 years through ages 65 to 70 years with a history of 3 consecutive normal Pap tests.  Fecal occult blood test (FOBT) of stool. / Every year beginning at age 50 years and continuing until age 75 years. You may not need to do this test if you get a colonoscopy every 10 years.  Flexible sigmoidoscopy or colonoscopy.** / Every 5 years for a flexible sigmoidoscopy or every 10 years for a colonoscopy beginning at age 50 years and continuing until age 75 years.  Hepatitis C blood test.** / For all people born from 1945 through  1965 and any individual with known risks for hepatitis C.  Skin self-exam. / Monthly.  Influenza vaccine. / Every year.  Tetanus, diphtheria, and acellular pertussis (Tdap/Td) vaccine.** / Consult your health care provider. Pregnant women should receive 1 dose of Tdap vaccine during each pregnancy. 1 dose of Td every 10 years.  Varicella vaccine.** / Consult your health care provider. Pregnant females who do not have evidence of immunity should receive the first dose after pregnancy.  Zoster vaccine.** / 1 dose for adults aged 60 years or older.  Measles, mumps, rubella (MMR) vaccine.** / You need at least 1 dose of MMR if you were born in 1957 or later. You may also need a 2nd dose. For females of childbearing age, rubella immunity should be determined. If there is no evidence of immunity, females who are not pregnant should be vaccinated. If there is no evidence of immunity, females who are pregnant should delay immunization until after pregnancy.  Pneumococcal 13-valent conjugate (PCV13) vaccine.** / Consult your health care provider.  Pneumococcal polysaccharide (PPSV23) vaccine.** / 1 to 2 doses if you smoke cigarettes or if you have certain conditions.  Meningococcal vaccine.** / Consult your health care provider.  Hepatitis A vaccine.** / Consult your health care provider.  Hepatitis B vaccine.** / Consult your health care provider.  Haemophilus influenzae type b (Hib) vaccine.** / Consult your health care provider. Ages 65   years and over  Blood pressure check.** / Every 1 to 2 years.  Lipid and cholesterol check.** / Every 5 years beginning at age 22 years.  Lung cancer screening. / Every year if you are aged 73-80 years and have a 30-pack-year history of smoking and currently smoke or have quit within the past 15 years. Yearly screening is stopped once you have quit smoking for at least 15 years or develop a health problem that would prevent you from having lung cancer  treatment.  Clinical breast exam.** / Every year after age 4 years.  BRCA-related cancer risk assessment.** / For women who have family members with a BRCA-related cancer (breast, ovarian, tubal, or peritoneal cancers).  Mammogram.** / Every year beginning at age 40 years and continuing for as long as you are in good health. Consult with your health care provider.  Pap test.** / Every 3 years starting at age 9 years through age 34 or 91 years with 3 consecutive normal Pap tests. Testing can be stopped between 65 and 70 years with 3 consecutive normal Pap tests and no abnormal Pap or HPV tests in the past 10 years.  HPV screening.** / Every 3 years from ages 57 years through ages 64 or 45 years with a history of 3 consecutive normal Pap tests. Testing can be stopped between 65 and 70 years with 3 consecutive normal Pap tests and no abnormal Pap or HPV tests in the past 10 years.  Fecal occult blood test (FOBT) of stool. / Every year beginning at age 15 years and continuing until age 17 years. You may not need to do this test if you get a colonoscopy every 10 years.  Flexible sigmoidoscopy or colonoscopy.** / Every 5 years for a flexible sigmoidoscopy or every 10 years for a colonoscopy beginning at age 86 years and continuing until age 71 years.  Hepatitis C blood test.** / For all people born from [birth year] through 1965 and any individual with known risks for hepatitis C.  Osteoporosis screening.** / A one-time screening for women ages 83 years and over and women at risk for fractures or osteoporosis.  Skin self-exam. / Monthly.  Influenza vaccine. / Every year.  Tetanus, diphtheria, and acellular pertussis (Tdap/Td) vaccine.** / 1 dose of Td every 10 years.  Varicella vaccine.** / Consult your health care provider.  Zoster vaccine.** / 1 dose for adults aged 61 years or older.  Pneumococcal 13-valent conjugate (PCV13) vaccine.** / Consult your health care provider.  Pneumococcal  polysaccharide (PPSV23) vaccine.** / 1 dose for all adults aged 28 years and older.  Meningococcal vaccine.** / Consult your health care provider.  Hepatitis A vaccine.** / Consult your health care provider.  Hepatitis B vaccine.** / Consult your health care provider.  Haemophilus influenzae type b (Hib) vaccine.** / Consult your health care provider. ** Family history and personal history of risk and conditions may change your health care provider's recommendations. Document Released: 05/19/2001 Document Revised: 08/07/2013 Document Reviewed: 08/18/2010 [facility] Patient Information 2015 [place], [place]. This information is not intended to replace advice given to you by your health care provider. Make sure you discuss any questions you have with your health care provider.

## 2014-02-19 NOTE — Progress Notes (Signed)
Subjective:     [name] is a 63 y.o. female and is here for a comprehensive physical exam. The patient reports no problems.  History   Social History  . Marital Status: Married    Spouse Name: N/A    Number of Children: N/A  . Years of Education: N/A   Occupational History  . [employer]    Social History Main Topics  . Smoking status: Current Every Day Smoker -- 0.50 packs/day for 48 years  . Smokeless tobacco: Never Used  . Alcohol Use: No  . Drug Use: No  . Sexual Activity:    Partners: Male   Other Topics Concern  . Not on file   Social History Narrative   Exercise--  3 flts steps a day for 8 hours   Health Maintenance  Topic Date Due  . FOOT EXAM  07/04/1960  . COLONOSCOPY  07/04/2000  . URINE MICROALBUMIN  06/04/2011  . HEMOGLOBIN A1C  08/15/2012  . MAMMOGRAM  02/15/2013  . INFLUENZA VACCINE  11/04/2013  . OPHTHALMOLOGY EXAM  11/22/2013  . PNEUMOCOCCAL POLYSACCHARIDE VACCINE (2) 01/05/2014  . PAP SMEAR  09/16/2014  . TETANUS/TDAP  01/23/2017  . ZOSTAVAX  Completed    The following portions of the patient's history were reviewed and updated as appropriate:  She  has a past medical history of Arthritis; Diabetes mellitus; and Thyroid disease. She  does not have any pertinent problems on file. She  has past surgical history that includes Cholecystectomy; Abdominal hysterectomy; Back surgery; Tonsilectomy, adenoidectomy, bilateral myringotomy and tubes; and Thyroid surgery. Her family history includes Alcohol abuse in her mother; Breast cancer in her cousin; Heart disease in her maternal grandfather; Stroke in her maternal grandmother and mother. She  reports that she has been smoking.  She has never used smokeless tobacco. She reports that she does not drink alcohol or use illicit drugs. She has a current medication list which includes the following prescription(s): albuterol, alendronate, alprazolam, celebrex, fluoxetine, glimepiride,  hydrochlorothiazide, insulin glargine, insulin pen needle, levothyroxine, levothyroxine, methocarbamol, tizanidine, tramadol, and valacyclovir. Current Outpatient Prescriptions on File Prior to Visit  Medication Sig Dispense Refill  . albuterol (PROAIR HFA) 108 (90 BASE) MCG/ACT inhaler 2 puffs qid prn 1 Inhaler 1  . alendronate (FOSAMAX) 70 MG tablet Take 1 tablet (70 mg total) by mouth every 7 (seven) days. Take with a full glass of water on an empty stomach. 4 tablet 11  . ALPRAZolam (XANAX) 0.5 MG tablet Take 1 tablet (0.5 mg total) by mouth 3 (three) times daily as needed for sleep or anxiety. 30 tablet 0  . CELEBREX 200 MG capsule TAKE 1 CAPSULE BY MOUTH DAILY 30 capsule 5  . FLUoxetine (PROZAC) 20 MG capsule Take 1 capsule (20 mg total) by mouth daily. 90 capsule 1  . glimepiride (AMARYL) 2 MG tablet Take 1 tablet (2 mg total) by mouth daily before breakfast. 30 tablet 2  . hydrochlorothiazide (HYDRODIURIL) 25 MG tablet TAKE 1 TABLET BY MOUTH DAILY--Office visit due now 90 tablet 0  . Insulin Glargine (LANTUS SOLOSTAR) 100 UNIT/ML SOPN Inject 10 units into the skin at bedtime-- labs are due now 15 mL 0  . Insulin Pen Needle (NOVOTWIST) 32G X 5 MM MISC Use daily at Bedtime with Lantus 100 each 0  . levothyroxine (SYNTHROID, LEVOTHROID) 112 MCG tablet TAKE 1 TABLET (112 MCG TOTAL) BY MOUTH EVERY OTHER DAY.  ALTERNATE WITH 125 MCG TABLET 45 tablet 2  . levothyroxine (SYNTHROID, LEVOTHROID) 125  MCG tablet TAKE 1 TABLET (125 MCG TOTAL) BY MOUTH EVERY OTHER DAY. ALTERNATE WITH 112MCG TABLET 45 tablet 2  . methocarbamol (ROBAXIN) 500 MG tablet     . traMADol (ULTRAM) 50 MG tablet Take 1 tablet (50 mg total) by mouth every 8 (eight) hours as needed for pain. 30 tablet 0  . valACYclovir (VALTREX) 1000 MG tablet Take 1 tablet (1,000 mg total) by mouth 2 (two) times daily. 30 tablet 0   No current facility-administered medications on file prior to visit.   She has No Known Allergies..  Review of  Systems Review of Systems  Constitutional: Negative for activity change, appetite change and fatigue.  HENT: Negative for hearing loss, congestion, tinnitus and ear discharge.  dentist q92m Eyes: Negative for visual disturbance (see optho q1y -- vision corrected to 20/20 with glasses).  Respiratory: Negative for chest tightness and shortness of breath.  + cough Cardiovascular: Negative for chest pain, palpitations and leg swelling.  Gastrointestinal: Negative for abdominal pain, diarrhea, constipation and abdominal distention.  Genitourinary: Negative for urgency, frequency, decreased urine volume and difficulty urinating.  Musculoskeletal: Negative for back pain, arthralgias and gait problem.  Skin: Negative for color change, pallor and rash.  Neurological: Negative for dizziness, light-headedness, numbness and headaches.  Hematological: Negative for adenopathy. Does not bruise/bleed easily.  Psychiatric/Behavioral: Negative for suicidal ideas, confusion, sleep disturbance, self-injury, dysphoric mood, decreased concentration and agitation.       Objective:    BP 122/75 mmHg  Pulse 80  Temp(Src) 98.1 F (36.7 C) (Oral)  Ht 5' (1.524 m)  Wt 120 lb (54.432 kg)  BMI 23.44 kg/m2  SpO2 98% General appearance: alert, cooperative, appears stated age and no distress Head: Normocephalic, without obvious abnormality, atraumatic Eyes: negative findings: lids and lashes normal and pupils equal, round, reactive to light and accomodation Ears: normal TM's and external ear canals both ears Nose: Nares normal. Septum midline. Mucosa normal. No drainage or sinus tenderness. Throat: lips, mucosa, and tongue normal; teeth and gums normal Neck: no adenopathy, no carotid bruit, no JVD, supple, symmetrical, trachea midline and thyroid not enlarged, symmetric, no tenderness/mass/nodules Back: symmetric, no curvature. ROM normal. No CVA tenderness. Lungs: clear to auscultation bilaterally Breasts:  normal appearance, no masses or tenderness Heart: regular rate and rhythm, S1, S2 normal, no murmur, click, rub or gallop Abdomen: soft, non-tender; bowel sounds normal; no masses,  no organomegaly Pelvic: external genitalia normal, no adnexal masses or tenderness, rectovaginal septum normal, uterus surgically absent, vagina normal without discharge and vaginal smear done Extremities: extremities normal, atraumatic, no cyanosis or edema Pulses: 2+ and symmetric Skin: Skin color, texture, turgor normal. No rashes or lesions Lymph nodes: Cervical, supraclavicular, and axillary nodes normal. Neurologic: Alert and oriented X 3, normal strength and tone. Normal symmetric reflexes. Normal coordination and gait Psych- no depression, no anxiety    Sensory exam of the foot is normal, tested with the monofilament. Good pulses, no lesions or ulcers, good peripheral pulses.   Assessment:    Healthy female exam.       Plan:    ghm utd  Check labs See After Visit Summary for Counseling Recommendations    1. Other specified hypothyroidism Check labs, con't meds - TSH  2. Diabetes mellitus type II, uncontrolled Check labs, con't meds - Basic metabolic panel - Hemoglobin A1c  3. Hyperlipidemia LDL goal <70 Check labs - Hepatic function panel - Lipid panel  4. Preventative health care  - Basic metabolic panel - CBC with Differential -  Hemoglobin A1c - Hepatic function panel - Lipid panel - Microalbumin / creatinine urine ratio - POCT urinalysis dipstick - TSH - Ambulatory referral to Gastroenterology  5. Breast cancer screening  - MM DIGITAL SCREENING BILATERAL; Future  6. Estrogen deficiency  - DG Bone Density; Future  7. Cough   - DG Chest 2 View; Future

## 2014-02-19 NOTE — Progress Notes (Signed)
Pt tolerated injections well. No signs of a reaction upon leaving the clinic.

## 2014-02-19 NOTE — Progress Notes (Signed)
Pre visit review using our clinic review tool, if applicable. No additional management support is needed unless otherwise documented below in the visit note. 

## 2014-02-20 ENCOUNTER — Telehealth: Payer: Self-pay

## 2014-02-20 NOTE — Telephone Encounter (Signed)
[name]  [phone] after Elizebeth Brooking returning your call from yesterday

## 2014-02-21 LAB — CYTOLOGY - PAP

## 2014-02-21 MED ORDER — SIMVASTATIN 20 MG PO TABS: 20.0000 mg | ORAL_TABLET | Freq: Every day | ORAL | Status: DC

## 2014-02-21 MED ORDER — LEVOTHYROXINE SODIUM 125 MCG PO TABS: 125.0000 ug | ORAL_TABLET | Freq: Every day | ORAL | Status: DC

## 2014-02-21 NOTE — Telephone Encounter (Signed)
Hypothyroid-- increase synthroid to 125 mcg daily #30 2 refills ---recheck 3 months 244,9 TSH Cholesterol--- LDL goal < 70, HDL >40, TG < 150. Diet and exercise will increase HDL and decrease LDL and TG. Fish, Fish Oil, Flaxseed oil will also help increase the HDL and decrease Triglycerides.  Recheck labs in 3 months Lipid, hep 272.4---- start zocor 20 mg #30 1 every night, 2 refillls Increase lantus 15 u daily Recheck 3 months Hgba1c, bmp 250.00  Per [name] patient has reviewed labs on my-chart. I made him aware I was faxing the medications to the pharmacy and he requested [name] on [facility] [phone] and fax number [phone]. Med's faxed.

## 2014-03-05 ENCOUNTER — Other Ambulatory Visit: Payer: Managed Care, Other (non HMO)

## 2014-03-07 LAB — HM DIABETES EYE EXAM

## 2014-03-13 ENCOUNTER — Encounter: Payer: Self-pay | Admitting: Family Medicine

## 2014-03-13 ENCOUNTER — Telehealth: Payer: Self-pay

## 2014-03-13 DIAGNOSIS — F419 Anxiety disorder, unspecified: Secondary | ICD-10-CM

## 2014-03-13 MED ORDER — ALPRAZOLAM 0.5 MG PO TABS: 0.5000 mg | ORAL_TABLET | Freq: Three times a day (TID) | ORAL | Status: DC | PRN

## 2014-03-13 NOTE — Telephone Encounter (Signed)
Refill x1 

## 2014-03-13 NOTE — Telephone Encounter (Signed)
Last seen 02/19/14 and filled 01/09/14 #30.   Please advise     KP

## 2014-03-22 ENCOUNTER — Other Ambulatory Visit: Payer: Self-pay

## 2014-03-22 MED ORDER — VALACYCLOVIR HCL 1 G PO TABS: 1000.0000 mg | ORAL_TABLET | Freq: Every day | ORAL | Status: DC

## 2014-04-13 ENCOUNTER — Other Ambulatory Visit: Payer: Self-pay

## 2014-04-13 MED ORDER — GLIMEPIRIDE 2 MG PO TABS: 2.0000 mg | ORAL_TABLET | Freq: Every day | ORAL | Status: DC

## 2014-04-13 NOTE — Telephone Encounter (Signed)
[name] at [facility] [id number] [place] [id number] [phone] (FX)   Glimepiride requested.     KP

## 2014-05-10 ENCOUNTER — Telehealth: Payer: Self-pay | Admitting: Family Medicine

## 2014-05-10 NOTE — Telephone Encounter (Signed)
Spoke with [name] and he said the company called them but he declined the glucometer at this time.       KP

## 2014-05-10 NOTE — Telephone Encounter (Signed)
Caller name:Rhiannon- Relation to ZD:GUYQ Call back [place]  Reason for call: pt is needing order for a new meter, test strips, and lancets.

## 2014-05-11 ENCOUNTER — Other Ambulatory Visit: Payer: Self-pay | Admitting: Family Medicine

## 2014-05-12 ENCOUNTER — Other Ambulatory Visit: Payer: Self-pay | Admitting: Family Medicine

## 2014-05-18 ENCOUNTER — Other Ambulatory Visit: Payer: Self-pay | Admitting: Family Medicine

## 2014-05-19 ENCOUNTER — Other Ambulatory Visit: Payer: Self-pay | Admitting: Family Medicine

## 2014-05-29 ENCOUNTER — Other Ambulatory Visit: Payer: Self-pay | Admitting: Family Medicine

## 2014-06-10 ENCOUNTER — Other Ambulatory Visit: Payer: Self-pay | Admitting: Family Medicine

## 2014-06-11 MED ORDER — LEVOTHYROXINE SODIUM 125 MCG PO TABS: 125.0000 ug | ORAL_TABLET | Freq: Every day | ORAL | Status: DC

## 2014-07-25 ENCOUNTER — Telehealth: Payer: Self-pay | Admitting: Family Medicine

## 2014-07-25 NOTE — Telephone Encounter (Signed)
Caller name: Judson Roch from [place]  Call back number: [phone]   Reason for call:  Requesting verbal order for clever choice meter, test strips and LANTUS

## 2014-07-27 NOTE — Telephone Encounter (Signed)
MSG Left to call the office     KP

## 2014-07-30 NOTE — Telephone Encounter (Signed)
As per pt did not request to go to [facility]

## 2014-07-30 NOTE — Telephone Encounter (Signed)
Noting has been called into [facility], patient is unaware of this request.     KP

## 2014-08-01 ENCOUNTER — Telehealth: Payer: Self-pay | Admitting: Medical

## 2014-08-01 ENCOUNTER — Telehealth: Payer: Self-pay | Admitting: Hematology & Oncology

## 2014-08-01 ENCOUNTER — Telehealth: Payer: Self-pay | Admitting: Family Medicine

## 2014-08-01 ENCOUNTER — Encounter: Payer: Self-pay | Admitting: Medical

## 2014-08-01 ENCOUNTER — Ambulatory Visit (INDEPENDENT_AMBULATORY_CARE_PROVIDER_SITE_OTHER): Payer: Managed Care, Other (non HMO) | Admitting: Medical

## 2014-08-01 ENCOUNTER — Telehealth: Payer: Self-pay | Admitting: *Deleted

## 2014-08-01 VITALS — BP 106/72 | HR 104 | Temp 98.2°F | Ht 60.0 in | Wt 116.4 lb

## 2014-08-01 DIAGNOSIS — R1011 Right upper quadrant pain: Secondary | ICD-10-CM

## 2014-08-01 DIAGNOSIS — R944 Abnormal results of kidney function studies: Secondary | ICD-10-CM

## 2014-08-01 DIAGNOSIS — R109 Unspecified abdominal pain: Secondary | ICD-10-CM | POA: Insufficient documentation

## 2014-08-01 DIAGNOSIS — R1031 Right lower quadrant pain: Secondary | ICD-10-CM | POA: Diagnosis not present

## 2014-08-01 LAB — COMPREHENSIVE METABOLIC PANEL
ALK PHOS: 103 U/L (ref 39–117)
ALT: 36 U/L — AB (ref 0–35)
AST: 27 U/L (ref 0–37)
Albumin: 4.4 g/dL (ref 3.5–5.2)
BILIRUBIN TOTAL: 0.5 mg/dL (ref 0.2–1.2)
BUN: 18 mg/dL (ref 6–23)
CALCIUM: 10.4 mg/dL (ref 8.4–10.5)
CO2: 34 meq/L — AB (ref 19–32)
Chloride: 100 mEq/L (ref 96–112)
Creatinine, Ser: 1.23 mg/dL — ABNORMAL HIGH (ref 0.40–1.20)
GFR: 46.71 mL/min — AB (ref >60.00)
Glucose, Bld: 123 mg/dL — ABNORMAL HIGH (ref 70–99)
Potassium: 4.2 mEq/L (ref 3.5–5.1)
Sodium: 137 mEq/L (ref 135–145)
TOTAL PROTEIN: 7.6 g/dL (ref 6.0–8.3)

## 2014-08-01 LAB — CBC WITH DIFFERENTIAL/PLATELET
BASOS ABS: 0 10*3/uL (ref 0.0–0.1)
Basophils Relative: 0.3 % (ref 0.0–3.0)
EOS PCT: 4.5 % (ref 0.0–5.0)
Eosinophils Absolute: 0.3 10*3/uL (ref 0.0–0.7)
HCT: 44.5 % (ref 36.0–46.0)
HEMOGLOBIN: 15.1 g/dL — AB (ref 12.0–15.0)
LYMPHS ABS: 1.9 10*3/uL (ref 0.7–4.0)
LYMPHS PCT: 27.3 % (ref 12.0–46.0)
MCHC: 33.9 g/dL (ref 30.0–36.0)
MCV: 92.2 fl (ref 78.0–100.0)
Monocytes Absolute: 0.8 10*3/uL (ref 0.1–1.0)
Monocytes Relative: 10.7 % (ref 3.0–12.0)
NEUTROS PCT: 57.2 % (ref 43.0–77.0)
Neutro Abs: 4 10*3/uL (ref 1.4–7.7)
Platelets: 224 10*3/uL (ref 150.0–400.0)
RBC: 4.83 Mil/uL (ref 3.87–5.11)
RDW: 13.1 % (ref 11.5–15.5)
WBC: 7 10*3/uL (ref 4.0–10.5)

## 2014-08-01 LAB — POCT URINALYSIS DIPSTICK
BILIRUBIN UA: POSITIVE
Glucose, UA: 500
KETONES UA: 5
LEUKOCYTES UA: NEGATIVE
Nitrite, UA: NEGATIVE
Protein, UA: 15
RBC UA: NEGATIVE
Spec Grav, UA: >=1.03
Urobilinogen, UA: 0.2
pH, UA: 5

## 2014-08-01 LAB — LIPASE: LIPASE: 44 U/L (ref 11.0–59.0)

## 2014-08-01 NOTE — Telephone Encounter (Signed)
Repeat labs:

## 2014-08-01 NOTE — Progress Notes (Signed)
Subjective:    Patient ID: [name], female    DOB: 07/02/1950, 64 y.o.   MRN: [id number]  HPI  Pt in with some rt upper quadrant(Ruq is where she inititially pointed to but pain more/primarily in rlq) and some rt upper back area. Pain since last Tuesday. Pain is present all the time. Pain worse if she eats. Greasy meal makes it worse. Pt has no gallbladder. Pt denies any heart burn. Pt gallbladder removed 2007. Pt does not drink any alcohol. Pt has no dysuria. Pt thought may uti. So she got cranberry pills otc. No fevers,no chills. Some nauseau. No vomiting.   Pt pain 8/10.   Last ate at 7:30. Pt appetite not decreased.  Review of Systems  Constitutional: Negative for fever, chills and fatigue.  Respiratory: Negative for cough, chest tightness, shortness of breath and wheezing.   Cardiovascular: Negative for chest pain and palpitations.  Gastrointestinal: Positive for abdominal pain. Negative for nausea, vomiting, diarrhea, constipation, blood in stool, abdominal distention and anal bleeding.  Genitourinary: Negative.   Musculoskeletal: Negative for back pain.  Hematological: Negative for adenopathy. Does not bruise/bleed easily.  Psychiatric/Behavioral: Negative for behavioral problems and confusion.   Past Medical History  Diagnosis Date  . Arthritis   . Diabetes mellitus   . Thyroid disease     History   Social History  . Marital Status: Married    Spouse Name: N/A  . Number of Children: N/A  . Years of Education: N/A   Occupational History  . [employer]    Social History Main Topics  . Smoking status: Current Every Day Smoker -- 0.50 packs/day for 48 years  . Smokeless tobacco: Never Used  . Alcohol Use: No  . Drug Use: No  . Sexual Activity:    Partners: Male   Other Topics Concern  . Not on file   Social History Narrative   Exercise--  3 flts steps a day for 8 hours    Past Surgical History  Procedure Laterality Date  . Cholecystectomy      . Abdominal hysterectomy    . Back surgery      x3  . Tonsilectomy, adenoidectomy, bilateral myringotomy and tubes    . Thyroid surgery      Family History  Problem Relation Age of Onset  . Stroke Mother   . Alcohol abuse Mother   . Stroke Maternal Grandmother   . Heart disease Maternal Grandfather   . Breast cancer Cousin     No Known Allergies  Current Outpatient Prescriptions on File Prior to Visit  Medication Sig Dispense Refill  . albuterol (PROAIR HFA) 108 (90 BASE) MCG/ACT inhaler 2 puffs qid prn 1 Inhaler 1  . alendronate (FOSAMAX) 70 MG tablet Take 1 tablet (70 mg total) by mouth every 7 (seven) days. Take with a full glass of water on an empty stomach. 4 tablet 11  . ALPRAZolam (XANAX) 0.5 MG tablet Take 1 tablet (0.5 mg total) by mouth 3 (three) times daily as needed for sleep or anxiety. 30 tablet 0  . CELEBREX 200 MG capsule TAKE ONE CAPSULE BY MOUTH ONCE DAILY 30 capsule 5  . FLUoxetine (PROZAC) 20 MG capsule TAKE 1 CAPSULE BY MOUTH DAILY 90 capsule 3  . glimepiride (AMARYL) 2 MG tablet Take 1 tablet (2 mg total) by mouth daily before breakfast. 30 tablet 3  . hydrochlorothiazide (HYDRODIURIL) 25 MG tablet TAKE 1 TABLET BY MOUTH DAILY--Office visit due now 90 tablet 0  .  Insulin Glargine (LANTUS SOLOSTAR) 100 UNIT/ML SOPN Inject 10 units into the skin at bedtime-- labs are due now (Patient taking differently: Inject 15 Units into the skin at bedtime. ) 15 mL 0  . Insulin Pen Needle (NOVOTWIST) 32G X 5 MM MISC Use daily at Bedtime with Lantus 100 each 0  . levothyroxine (SYNTHROID, LEVOTHROID) 125 MCG tablet Take 1 tablet (125 mcg total) by mouth daily before breakfast. 90 tablet 0  . methocarbamol (ROBAXIN) 500 MG tablet     . simvastatin (ZOCOR) 20 MG tablet Take 1 tablet (20 mg total) by mouth at bedtime. Repeat labs due in March 30 tablet 2  . tiZANidine (ZANAFLEX) 4 MG tablet Take 4 mg by mouth every 6 (six) hours as needed for muscle spasms.    . traMADol  (ULTRAM) 50 MG tablet Take 1 tablet (50 mg total) by mouth every 8 (eight) hours as needed for pain. 30 tablet 0  . valACYclovir (VALTREX) 1000 MG tablet Take 1 tablet (1,000 mg total) by mouth daily. 90 tablet 3   No current facility-administered medications on file prior to visit.    BP 106/72 mmHg  Pulse 104  Temp(Src) 98.2 F (36.8 C) (Oral)  Ht 5' (1.524 m)  Wt 116 lb 6.4 oz (52.799 kg)  BMI 22.73 kg/m2  SpO2 96%      Objective:   Physical Exam  General Appearance- Not in acute distress. Does not appear to be in severe pain except for on exam/direct palpation(she states she has high treshold for pain)  HEENT Eyes- Scleraeral/Conjuntiva-bilat- Not Yellow. Mouth & Throat- Normal.  Chest and Lung Exam Auscultation: Breath sounds:-Normal. CTA. Adventitious sounds:- No Adventitious sounds.  Cardiovascular Auscultation:Rythm - Regular. Heart Sounds -Normal heart sounds.  Abdomen Inspection:-Inspection Normal.  Palpation/Perucssion: Palpation and Percussion of the abdomen reveal- Rt lower quadrant very Tender, Rt upper quadrant mild - moderate tender No Rebound tenderness, No rigidity(Guarding) and No Palpable abdominal masses. NO heel jar pain. Liver:-Normal.  Spleen:- Normal.   Rectal Anorectal Exam: Stool - Hemoccult of stool/mucous is Heme Negative. External - normal external exam. Internal - normal sphincter tone. No rectal mass.  Back- no cva tenderness.    Assessment & Plan:

## 2014-08-01 NOTE — Telephone Encounter (Signed)
Caller name: Ruby Cola from [place]  Call back number: [phone]   Reason for call:  Requesting orders for CT of abdomin and pelvis with contrast. Please fax to [facility] fax# [phone] contact # [phone]. Pt has appointment at 3:30pm

## 2014-08-01 NOTE — Telephone Encounter (Signed)
[facility] requesting BUN and Cr from labs drawn earlier today.  Lab results faxed.

## 2014-08-01 NOTE — Patient Instructions (Addendum)
Abdominal pain Primarily rt lower quadrant. Some ruq but much less than lower quadrant. Will get stat cbc, cmp, and lipase.  Also state abdomen/pelvis. Stay downstairs until I am called with results.    Management plan will depend on how doing and results of test.  Abdomen/pelic CT is scheduled at 4 pm.   If pt has worse pain even before studies then advised ED evaluation.

## 2014-08-01 NOTE — Telephone Encounter (Signed)
I ordered the ct through our radiology service.But then it was scheduled with other service. They called and told Elyn Aquas PA-C that no appendencitis and only had stools. I tried to call pt and had to leave a message. Advised to try to use miralax otc. Asked her to update [place] tomorrow am. I will get lpn to call and advise her regarding the lab results. I checked the fax for copy of radiology report and none seen on the fax.

## 2014-08-01 NOTE — Assessment & Plan Note (Signed)
Primarily rt lower quadrant. Some ruq but much less than lower quadrant. Will get stat cbc, cmp, and lipase.  Also state abdomen/pelvis. Stay downstairs until I am called with results.

## 2014-08-01 NOTE — Telephone Encounter (Signed)
Pt's insurance company called to cx CT's for today. I called pt and they are scheduled for [facility] I gave her the number to call.

## 2014-08-01 NOTE — Progress Notes (Signed)
Pre visit review using our clinic review tool, if applicable. No additional management support is needed unless otherwise documented below in the visit note. 

## 2014-08-02 ENCOUNTER — Telehealth: Payer: Self-pay | Admitting: Medical

## 2014-08-02 NOTE — Telephone Encounter (Signed)
I did talk with pt. She did get message and did try miralax last night. She had bm but her pain was still present. She took tramadol today and her pain resolved completely with tramadol. She also states no fever, no chills,no back pain, no nausea or vomiting. I advised her she could try dulcolox.(may clear some residual stool). Fast past midnight tonight. Appointment with me tomorrow at 8:15. I advised her that if she has pain in rlq despite tramadol then go to the ED. Pt agreed she would. I did ask staff to call [facility] today for report of ct abdomen. Yesterday report just stated stools present. But no appendicitis. Will ask staff again to call [facility].

## 2014-08-02 NOTE — Telephone Encounter (Signed)
Called pt to see if she got my message that I left last night and to see if had BM. I am at this point waiting for written report from [facility]. Verbal report stated no appendicitis just full of stools.

## 2014-08-02 NOTE — Telephone Encounter (Signed)
This was ordered by Percell Miller.    KP

## 2014-08-02 NOTE — Telephone Encounter (Signed)
I did get report from [facility] and reviewed placed it in area to be scanned.

## 2014-08-03 ENCOUNTER — Encounter: Payer: Self-pay | Admitting: Medical

## 2014-08-03 ENCOUNTER — Telehealth: Payer: Self-pay | Admitting: Medical

## 2014-08-03 ENCOUNTER — Ambulatory Visit (INDEPENDENT_AMBULATORY_CARE_PROVIDER_SITE_OTHER): Payer: Managed Care, Other (non HMO) | Admitting: Medical

## 2014-08-03 VITALS — BP 108/71 | HR 103 | Temp 98.2°F | Ht 60.0 in | Wt 115.4 lb

## 2014-08-03 DIAGNOSIS — R1031 Right lower quadrant pain: Secondary | ICD-10-CM | POA: Diagnosis not present

## 2014-08-03 DIAGNOSIS — Z9889 Other specified postprocedural states: Secondary | ICD-10-CM

## 2014-08-03 DIAGNOSIS — R739 Hyperglycemia, unspecified: Secondary | ICD-10-CM

## 2014-08-03 LAB — CBC WITH DIFFERENTIAL/PLATELET
Basophils Absolute: 0 10*3/uL (ref 0.0–0.1)
Basophils Relative: 0.4 % (ref 0.0–3.0)
EOS ABS: 0.2 10*3/uL (ref 0.0–0.7)
Eosinophils Relative: 3.2 % (ref 0.0–5.0)
HCT: 42.7 % (ref 36.0–46.0)
Hemoglobin: 14.5 g/dL (ref 12.0–15.0)
LYMPHS ABS: 1.3 10*3/uL (ref 0.7–4.0)
Lymphocytes Relative: 19.9 % (ref 12.0–46.0)
MCHC: 34 g/dL (ref 30.0–36.0)
MCV: 91.7 fl (ref 78.0–100.0)
Monocytes Absolute: 0.6 10*3/uL (ref 0.1–1.0)
Monocytes Relative: 9.9 % (ref 3.0–12.0)
Neutro Abs: 4.2 10*3/uL (ref 1.4–7.7)
Neutrophils Relative %: 66.6 % (ref 43.0–77.0)
PLATELETS: 204 10*3/uL (ref 150.0–400.0)
RBC: 4.66 Mil/uL (ref 3.87–5.11)
RDW: 12.9 % (ref 11.5–15.5)
WBC: 6.3 10*3/uL (ref 4.0–10.5)

## 2014-08-03 LAB — COMPREHENSIVE METABOLIC PANEL
ALT: 28 U/L (ref 0–35)
AST: 19 U/L (ref 0–37)
Albumin: 3.8 g/dL (ref 3.5–5.2)
Alkaline Phosphatase: 97 U/L (ref 39–117)
BUN: 12 mg/dL (ref 6–23)
CHLORIDE: 103 meq/L (ref 96–112)
CO2: 26 meq/L (ref 19–32)
CREATININE: 0.54 mg/dL (ref 0.40–1.20)
Calcium: 9.7 mg/dL (ref 8.4–10.5)
GFR: 120.77 mL/min (ref >60.00)
GLUCOSE: 184 mg/dL — AB (ref 70–99)
Potassium: 4.1 mEq/L (ref 3.5–5.1)
SODIUM: 137 meq/L (ref 135–145)
Total Bilirubin: 0.5 mg/dL (ref 0.2–1.2)
Total Protein: 7.1 g/dL (ref 6.0–8.3)

## 2014-08-03 LAB — HEMOGLOBIN A1C: Hgb A1c MFr Bld: 7.8 % — ABNORMAL HIGH (ref 4.6–6.5)

## 2014-08-03 NOTE — Patient Instructions (Signed)
Abdominal pain Since ct scan test negative and she has past stools, I do not think any obstruction. Will evaluate ovaries by [place]. Refer pt to general surgeon by Monday hopefully to see if clincaly suspicious for appendicitis despite negative studies.   Continue tramadol. And if pain worsens despite then go to ED. On day of specialist appointment do not take tramadol.       Follow up 4 days(If can't see surgeon by Monday) or as needed

## 2014-08-03 NOTE — Progress Notes (Signed)
Subjective:    Patient ID: [name], female    DOB: 03-Aug-1950, 64 y.o.   MRN: [id number]  HPI  Pt in today. She states had pretty good night last night last night. As long as she has tramadol in system no RLQ pain. Pt did have another bm last night after taking dulcolax. Pt CT scan showed a lot of stools. Stated no appendicitis(no abdominal bloating and last 2 bm normal). No fever, no chills, no nausea, no vomiting, or back pain.(then states baseline arthritis and hx of back pain). Nothing outside of normal.   Pt states due for colonocopy. She had last one done 5  Years ago.   Review of Systems  Constitutional: Negative for fever, chills, diaphoresis, activity change and fatigue.  HENT: Negative.   Respiratory: Negative for cough, chest tightness and shortness of breath.   Cardiovascular: Negative for chest pain, palpitations and leg swelling.  Gastrointestinal: Negative for nausea, vomiting and abdominal pain.       Rlq pain if she does not take her tramadol. Pain still on high side without tramadol.  Endocrine: Negative for polydipsia, polyphagia and polyuria.  Genitourinary: Negative.  Negative for dysuria, urgency, frequency, hematuria, flank pain, vaginal pain and pelvic pain.  Musculoskeletal: Negative for back pain, neck pain and neck stiffness.  Neurological: Negative for dizziness, tremors, seizures, syncope, facial asymmetry, speech difficulty, weakness, light-headedness, numbness and headaches.  Psychiatric/Behavioral: Negative for behavioral problems, confusion and agitation. The patient is not nervous/anxious.      Past Medical History  Diagnosis Date  . Arthritis   . Diabetes mellitus   . Thyroid disease     History   Social History  . Marital Status: Married    Spouse Name: N/A  . Number of Children: N/A  . Years of Education: N/A   Occupational History  . [employer]    Social History Main Topics  . Smoking status: Current Every Day Smoker --  0.50 packs/day for 48 years  . Smokeless tobacco: Never Used  . Alcohol Use: No  . Drug Use: No  . Sexual Activity:    Partners: Male   Other Topics Concern  . Not on file   Social History Narrative   Exercise--  3 flts steps a day for 8 hours    Past Surgical History  Procedure Laterality Date  . Cholecystectomy    . Abdominal hysterectomy    . Back surgery      x3  . Tonsilectomy, adenoidectomy, bilateral myringotomy and tubes    . Thyroid surgery      Family History  Problem Relation Age of Onset  . Stroke Mother   . Alcohol abuse Mother   . Stroke Maternal Grandmother   . Heart disease Maternal Grandfather   . Breast cancer Cousin     No Known Allergies  Current Outpatient Prescriptions on File Prior to Visit  Medication Sig Dispense Refill  . albuterol (PROAIR HFA) 108 (90 BASE) MCG/ACT inhaler 2 puffs qid prn 1 Inhaler 1  . alendronate (FOSAMAX) 70 MG tablet Take 1 tablet (70 mg total) by mouth every 7 (seven) days. Take with a full glass of water on an empty stomach. 4 tablet 11  . ALPRAZolam (XANAX) 0.5 MG tablet Take 1 tablet (0.5 mg total) by mouth 3 (three) times daily as needed for sleep or anxiety. 30 tablet 0  . CELEBREX 200 MG capsule TAKE ONE CAPSULE BY MOUTH ONCE DAILY 30 capsule 5  . FLUoxetine (  PROZAC) 20 MG capsule TAKE 1 CAPSULE BY MOUTH DAILY 90 capsule 3  . glimepiride (AMARYL) 2 MG tablet Take 1 tablet (2 mg total) by mouth daily before breakfast. 30 tablet 3  . hydrochlorothiazide (HYDRODIURIL) 25 MG tablet TAKE 1 TABLET BY MOUTH DAILY--Office visit due now 90 tablet 0  . Insulin Glargine (LANTUS SOLOSTAR) 100 UNIT/ML SOPN Inject 10 units into the skin at bedtime-- labs are due now (Patient taking differently: Inject 15 Units into the skin at bedtime. ) 15 mL 0  . Insulin Pen Needle (NOVOTWIST) 32G X 5 MM MISC Use daily at Bedtime with Lantus 100 each 0  . levothyroxine (SYNTHROID, LEVOTHROID) 125 MCG tablet Take 1 tablet (125 mcg total) by mouth  daily before breakfast. 90 tablet 0  . methocarbamol (ROBAXIN) 500 MG tablet     . simvastatin (ZOCOR) 20 MG tablet Take 1 tablet (20 mg total) by mouth at bedtime. Repeat labs due in March 30 tablet 2  . tiZANidine (ZANAFLEX) 4 MG tablet Take 4 mg by mouth every 6 (six) hours as needed for muscle spasms.    . traMADol (ULTRAM) 50 MG tablet Take 1 tablet (50 mg total) by mouth every 8 (eight) hours as needed for pain. 30 tablet 0  . valACYclovir (VALTREX) 1000 MG tablet Take 1 tablet (1,000 mg total) by mouth daily. 90 tablet 3   No current facility-administered medications on file prior to visit.    BP 108/71 mmHg  Pulse 103  Temp(Src) 98.2 F (36.8 C) (Oral)  Ht 5' (1.524 m)  Wt 115 lb 6.4 oz (52.345 kg)  BMI 22.54 kg/m2  SpO2 96%      Objective:   Physical Exam   General Appearance- Not in acute distress.  HEENT Eyes- Scleraeral/Conjuntiva-bilat- Not Yellow. Mouth & Throat- Normal.  Chest and Lung Exam Auscultation: Breath sounds:-Normal. Adventitious sounds:- No Adventitious sounds.  Cardiovascular Auscultation:Rythm - Regular. Heart Sounds -Normal heart sounds.  Abdomen Inspection:-Inspection Normal.  Palpation/Perucssion: Palpation and Percussion of the abdomen reveal- No rlq Tender on exam(faint rlq on rt lower extremity heel jar test), No Rebound tenderness, No rigidity(Guarding) and No Palpable abdominal masses.  Liver:-Normal.  Spleen:- Normal.   Back- no cva tenderness       Assessment & Plan:

## 2014-08-03 NOTE — Assessment & Plan Note (Signed)
Since ct scan test negative and she has past stools, I do not think any obstruction. Will evaluate ovaries by [place]. Refer pt to general surgeon by Monday hopefully to see if clincaly suspicious for appendicitis despite negative studies.   Continue tramadol. And if pain worsens despite then go to ED. On day of specialist appointment do not take tramadol.

## 2014-08-03 NOTE — Telephone Encounter (Signed)
Pt husband may have called about reason on surgeon referral. I discussed with pt my clinical reasoning. She got call from specialist office today but she was not home. She is going to call them on Monday morning to see if they have appointment scheduled.

## 2014-08-03 NOTE — Progress Notes (Signed)
Pre visit review using our clinic review tool, if applicable. No additional management support is needed unless otherwise documented below in the visit note. 

## 2014-08-06 ENCOUNTER — Ambulatory Visit (INDEPENDENT_AMBULATORY_CARE_PROVIDER_SITE_OTHER): Payer: Managed Care, Other (non HMO) | Admitting: Family Medicine

## 2014-08-06 ENCOUNTER — Encounter: Payer: Self-pay | Admitting: Family Medicine

## 2014-08-06 VITALS — BP 100/68 | HR 103 | Temp 98.8°F | Wt 116.4 lb

## 2014-08-06 DIAGNOSIS — E039 Hypothyroidism, unspecified: Secondary | ICD-10-CM | POA: Diagnosis not present

## 2014-08-06 DIAGNOSIS — R739 Hyperglycemia, unspecified: Secondary | ICD-10-CM | POA: Diagnosis not present

## 2014-08-06 DIAGNOSIS — E785 Hyperlipidemia, unspecified: Secondary | ICD-10-CM

## 2014-08-06 DIAGNOSIS — R1013 Epigastric pain: Secondary | ICD-10-CM | POA: Diagnosis not present

## 2014-08-06 DIAGNOSIS — F419 Anxiety disorder, unspecified: Secondary | ICD-10-CM

## 2014-08-06 MED ORDER — GI COCKTAIL ~~LOC~~: 30.0000 mL | Freq: Two times a day (BID) | ORAL | Status: DC

## 2014-08-06 MED ORDER — GI COCKTAIL ~~LOC~~
30.0000 mL | Freq: Once | ORAL | Status: AC
  Administered 2014-08-06: 30 mL via ORAL

## 2014-08-06 MED ORDER — PANTOPRAZOLE SODIUM 40 MG PO TBEC: 40.0000 mg | DELAYED_RELEASE_TABLET | Freq: Every day | ORAL | Status: DC

## 2014-08-06 MED ORDER — ALPRAZOLAM 0.5 MG PO TABS: 0.5000 mg | ORAL_TABLET | Freq: Three times a day (TID) | ORAL | Status: DC | PRN

## 2014-08-06 NOTE — Progress Notes (Signed)
Pre visit review using our clinic review tool, if applicable. No additional management support is needed unless otherwise documented below in the visit note. 

## 2014-08-06 NOTE — Progress Notes (Signed)
Patient ID: [name], female    DOB: July 16, 1950  Age: 64 y.o. MRN: [id number]    Subjective:  Subjective HPI [name] presents for abd pain and she needs labs done for thyroid.   Pt was seen here -- [place] and CT neg.  Pt was going to go to surgeon but wanted to come her first.    Review of Systems  Constitutional: Negative for activity change, appetite change, fatigue and unexpected weight change.  Respiratory: Negative for cough and shortness of breath.   Cardiovascular: Negative for chest pain and palpitations.  Gastrointestinal: Positive for nausea and abdominal pain.  Psychiatric/Behavioral: Negative for behavioral problems and dysphoric mood. The patient is not nervous/anxious.     History Past Medical History  Diagnosis Date  . Arthritis   . Diabetes mellitus   . Thyroid disease     She has past surgical history that includes Cholecystectomy; Abdominal hysterectomy; Back surgery; Tonsilectomy, adenoidectomy, bilateral myringotomy and tubes; and Thyroid surgery.   Her family history includes Alcohol abuse in her mother; Breast cancer in her cousin; Heart disease in her maternal grandfather; Stroke in her maternal grandmother and mother.She reports that she has been smoking.  She has never used smokeless tobacco. She reports that she does not drink alcohol or use illicit drugs.  Current Outpatient Prescriptions on File Prior to Visit  Medication Sig Dispense Refill  . albuterol (PROAIR HFA) 108 (90 BASE) MCG/ACT inhaler 2 puffs qid prn 1 Inhaler 1  . alendronate (FOSAMAX) 70 MG tablet Take 1 tablet (70 mg total) by mouth every 7 (seven) days. Take with a full glass of water on an empty stomach. 4 tablet 11  . CELEBREX 200 MG capsule TAKE ONE CAPSULE BY MOUTH ONCE DAILY 30 capsule 5  . FLUoxetine (PROZAC) 20 MG capsule TAKE 1 CAPSULE BY MOUTH DAILY 90 capsule 3  . glimepiride (AMARYL) 2 MG tablet Take 1 tablet (2 mg total) by mouth daily before breakfast. 30 tablet 3  .  hydrochlorothiazide (HYDRODIURIL) 25 MG tablet TAKE 1 TABLET BY MOUTH DAILY--Office visit due now 90 tablet 0  . Insulin Glargine (LANTUS SOLOSTAR) 100 UNIT/ML SOPN Inject 10 units into the skin at bedtime-- labs are due now (Patient taking differently: Inject 15 Units into the skin at bedtime. ) 15 mL 0  . Insulin Pen Needle (NOVOTWIST) 32G X 5 MM MISC Use daily at Bedtime with Lantus 100 each 0  . levothyroxine (SYNTHROID, LEVOTHROID) 125 MCG tablet Take 1 tablet (125 mcg total) by mouth daily before breakfast. 90 tablet 0  . methocarbamol (ROBAXIN) 500 MG tablet     . simvastatin (ZOCOR) 20 MG tablet Take 1 tablet (20 mg total) by mouth at bedtime. Repeat labs due in March 30 tablet 2  . tiZANidine (ZANAFLEX) 4 MG tablet Take 4 mg by mouth every 6 (six) hours as needed for muscle spasms.    . traMADol (ULTRAM) 50 MG tablet Take 1 tablet (50 mg total) by mouth every 8 (eight) hours as needed for pain. 30 tablet 0  . valACYclovir (VALTREX) 1000 MG tablet Take 1 tablet (1,000 mg total) by mouth daily. 90 tablet 3   No current facility-administered medications on file prior to visit.     Objective:  Objective Physical Exam  Constitutional: She is oriented to person, place, and time. She appears well-developed and well-nourished. No distress.  HENT:  Right Ear: External ear normal.  Left Ear: External ear normal.  Nose: Nose normal.  Mouth/Throat:  Oropharynx is clear and moist.  Eyes: EOM are normal. Pupils are equal, round, and reactive to light.  Neck: Normal range of motion. Neck supple.  Cardiovascular: Normal rate, regular rhythm and normal heart sounds.   No murmur heard. Pulmonary/Chest: Effort normal and breath sounds normal. No respiratory distress. She has no wheezes. She has no rales. She exhibits no tenderness.  Abdominal: Soft. Bowel sounds are normal. She exhibits no distension and no mass. There is tenderness. There is guarding. There is no rebound.    Neurological: She is  alert and oriented to person, place, and time.  Psychiatric: She has a normal mood and affect. Her behavior is normal. Judgment and thought content normal.   BP 100/68 mmHg  Pulse 103  Temp(Src) 98.8 F (37.1 C) (Oral)  Wt 116 lb 6.4 oz (52.799 kg)  SpO2 96% Wt Readings from Last 3 Encounters:  08/06/14 116 lb 6.4 oz (52.799 kg)  08/03/14 115 lb 6.4 oz (52.345 kg)  08/01/14 116 lb 6.4 oz (52.799 kg)     Lab Results  Component Value Date   WBC 6.3 08/03/2014   HGB 14.5 08/03/2014   HCT 42.7 08/03/2014   PLT 204.0 08/03/2014   GLUCOSE 184* 08/03/2014   CHOL 188 02/19/2014   TRIG 86.0 02/19/2014   HDL 46.10 02/19/2014   LDLDIRECT 188.4 06/04/2010   LDLCALC 125* 02/19/2014   ALT 28 08/03/2014   AST 19 08/03/2014   NA 137 08/03/2014   K 4.1 08/03/2014   CL 103 08/03/2014   CREATININE 0.54 08/03/2014   BUN 12 08/03/2014   CO2 26 08/03/2014   TSH 6.30* 02/19/2014   HGBA1C 7.8* 08/03/2014   MICROALBUR 0.4 02/19/2014    Dg Chest 2 View  02/19/2014   CLINICAL DATA:  64 year old female with a history of cough and congestion.  EXAM: CHEST - 2 VIEW  COMPARISON:  Plain film 02/08/2013, chest CT 10/30/2005  FINDINGS: Cardiomediastinal silhouette projects within normal limits in size and contour. Calcifications of the aortic arch.  No confluent airspace disease, pneumothorax, or pleural effusion. Chronic coarsening of the interstitial markings.  No displaced fracture.  Surgical changes of cholecystectomy.  Surgical changes of the cervical region compatible with anterior cervical discectomy with plate screw fixation.  IMPRESSION: Coarsened interstitial markings can be seen with bronchitis or potentially respiratory bronchiolitis (if the patient is a smoker). No confluent airspace disease.  Atherosclerosis.  Signed,  Dulcy Fanny. Earleen Newport, DO  [facility]  [facility]   Electronically Signed   By: Corrie Mckusick D.O.   On: 02/19/2014 10:02   Mm  Digital Screening Bilateral  02/21/2014   CLINICAL DATA:  Screening.  EXAM: DIGITAL SCREENING BILATERAL MAMMOGRAM WITH CAD  COMPARISON:  Previous exam(s).  ACR Breast Density Category c: The breast tissue is heterogeneously dense, which may obscure small masses.  FINDINGS: There are no findings suspicious for malignancy. Images were processed with CAD.  IMPRESSION: No mammographic evidence of malignancy. A result letter of this screening mammogram will be mailed directly to the patient.  RECOMMENDATION: Screening mammogram in one year. (Code:SM-B-01Y)  BI-RADS CATEGORY  1: Negative.   Electronically Signed   By: Shon Hale M.D.   On: 02/21/2014 16:05     Assessment & Plan:  Plan I have discontinued Ms. Muniz's gi cocktail. I am also having her start on pantoprazole. Additionally, I am having her maintain her Insulin Pen Needle, alendronate, traMADol, Insulin Glargine, methocarbamol, albuterol, hydrochlorothiazide, tiZANidine, valACYclovir, glimepiride, CELEBREX, simvastatin,  FLUoxetine, levothyroxine, and ALPRAZolam. We administered gi cocktail.  Meds ordered this encounter  Medications  . DISCONTD: Alum & Mag Hydroxide-Simeth (GI COCKTAIL) SUSP suspension    Sig: Take 30 mLs by mouth 2 (two) times daily. Shake well.    Dispense:  30 mL    Refill:  0  . gi cocktail (Maalox,Lidocaine,Donnatal)    Sig:   . ALPRAZolam (XANAX) 0.5 MG tablet    Sig: Take 1 tablet (0.5 mg total) by mouth 3 (three) times daily as needed for sleep or anxiety.    Dispense:  30 tablet    Refill:  2  . pantoprazole (PROTONIX) 40 MG tablet    Sig: Take 1 tablet (40 mg total) by mouth daily.    Dispense:  30 tablet    Refill:  11    Problem List Items Addressed This Visit    None    Visit Diagnoses    Hypothyroidism, unspecified hypothyroidism type    -  Primary    Relevant Orders    TSH    TSH    Hyperlipidemia        Relevant Orders    Lipid panel    Hepatic function panel    Hyperglycemia         Relevant Orders    Basic metabolic panel    H. pylori antibody, IgG    Hemoglobin A1c    Abdominal pain, epigastric        Relevant Medications    gi cocktail (Maalox,Lidocaine,Donnatal) (Completed)    pantoprazole (PROTONIX) 40 MG tablet    Other Relevant Orders    Basic metabolic panel    Lipid panel    Hepatic function panel    H. pylori antibody, IgG    Hemoglobin A1c    TSH    Ambulatory referral to Gastroenterology    Anxiety        Relevant Medications    ALPRAZolam (XANAX) 0.5 MG tablet       Follow-up: Return in about 6 months (around 02/06/2015), or if symptoms worsen or fail to improve, for cpe.  Garnet Koyanagi, DO

## 2014-08-06 NOTE — Patient Instructions (Signed)

## 2014-08-07 LAB — H. PYLORI ANTIBODY, IGG: H PYLORI IGG: NEGATIVE

## 2014-08-07 LAB — BASIC METABOLIC PANEL
BUN: 12 mg/dL (ref 6–23)
CALCIUM: 9.9 mg/dL (ref 8.4–10.5)
CHLORIDE: 101 meq/L (ref 96–112)
CO2: 23 mEq/L (ref 19–32)
CREATININE: 0.67 mg/dL (ref 0.40–1.20)
GFR: 94.16 mL/min (ref >60.00)
Glucose, Bld: 97 mg/dL (ref 70–99)
Potassium: 3.9 mEq/L (ref 3.5–5.1)
Sodium: 136 mEq/L (ref 135–145)

## 2014-08-07 LAB — LIPID PANEL
CHOL/HDL RATIO: 4
Cholesterol: 176 mg/dL (ref 0–200)
HDL: 43.7 mg/dL (ref >39.00)
LDL Cholesterol: 101 mg/dL — ABNORMAL HIGH (ref 0–99)
NONHDL: 132.3
Triglycerides: 157 mg/dL — ABNORMAL HIGH (ref 0.0–149.0)
VLDL: 31.4 mg/dL (ref 0.0–40.0)

## 2014-08-07 LAB — HEPATIC FUNCTION PANEL
ALBUMIN: 3.9 g/dL (ref 3.5–5.2)
ALK PHOS: 91 U/L (ref 39–117)
ALT: 25 U/L (ref 0–35)
AST: 26 U/L (ref 0–37)
BILIRUBIN DIRECT: 0.1 mg/dL (ref 0.0–0.3)
BILIRUBIN TOTAL: 0.2 mg/dL (ref 0.2–1.2)
Total Protein: 7.7 g/dL (ref 6.0–8.3)

## 2014-08-07 LAB — HEMOGLOBIN A1C: Hgb A1c MFr Bld: 7.9 % — ABNORMAL HIGH (ref 4.6–6.5)

## 2014-08-07 LAB — TSH: TSH: 1.37 u[IU]/mL (ref 0.35–4.50)

## 2014-08-08 ENCOUNTER — Telehealth: Payer: Self-pay | Admitting: *Deleted

## 2014-08-08 ENCOUNTER — Other Ambulatory Visit: Payer: Self-pay

## 2014-08-08 NOTE — Telephone Encounter (Signed)
Patient stated she's already on cholesterol medications and she doesn't want another one. Also, patient would like to have omeprazole 40 mg for he stomach ulcers. Please advise.

## 2014-08-08 NOTE — Telephone Encounter (Signed)
-----   Message from Rosalita Chessman, DO sent at 08/08/2014 12:56 PM EDT ----- Thyroid-- is normall Cholesterol--- LDL goal < 70,  HDL >40,  TG < 150.  Diet and exercise will increase HDL and decrease LDL and TG.  Fish,  Fish Oil, Flaxseed oil will also help increase the HDL and decrease Triglycerides.   Recheck labs in 3 months---add fenofibric acid 105 mg 1 a day #30 2 refills con't other meds DMII, hyperlipdemia---bmp, hgba1c, lipid, hep.

## 2014-08-08 NOTE — Telephone Encounter (Signed)
Prior authorization for pantoprazole initiated through [place]. Awaiting determination.

## 2014-08-10 ENCOUNTER — Other Ambulatory Visit: Payer: Self-pay | Admitting: Family Medicine

## 2014-08-13 ENCOUNTER — Ambulatory Visit: Payer: Managed Care, Other (non HMO) | Admitting: Family Medicine

## 2014-08-13 ENCOUNTER — Encounter: Payer: Self-pay | Admitting: Gastroenterology

## 2014-08-14 ENCOUNTER — Encounter: Payer: Self-pay | Admitting: Family Medicine

## 2014-08-27 ENCOUNTER — Encounter: Payer: Self-pay | Admitting: Family Medicine

## 2014-09-13 ENCOUNTER — Other Ambulatory Visit: Payer: Self-pay | Admitting: Family Medicine

## 2014-09-15 ENCOUNTER — Other Ambulatory Visit: Payer: Self-pay | Admitting: Family Medicine

## 2014-10-15 ENCOUNTER — Ambulatory Visit: Payer: Managed Care, Other (non HMO) | Admitting: Gastroenterology

## 2014-10-18 ENCOUNTER — Other Ambulatory Visit: Payer: Self-pay | Admitting: Family Medicine

## 2014-11-14 LAB — HM DIABETES EYE EXAM

## 2014-11-23 DIAGNOSIS — M47812 Spondylosis without myelopathy or radiculopathy, cervical region: Secondary | ICD-10-CM | POA: Insufficient documentation

## 2014-11-23 DIAGNOSIS — M542 Cervicalgia: Secondary | ICD-10-CM | POA: Insufficient documentation

## 2014-11-23 DIAGNOSIS — M5030 Other cervical disc degeneration, unspecified cervical region: Secondary | ICD-10-CM | POA: Insufficient documentation

## 2015-02-05 ENCOUNTER — Encounter: Payer: Self-pay | Admitting: Family Medicine

## 2015-02-05 ENCOUNTER — Ambulatory Visit (INDEPENDENT_AMBULATORY_CARE_PROVIDER_SITE_OTHER): Payer: Managed Care, Other (non HMO) | Admitting: Family Medicine

## 2015-02-05 ENCOUNTER — Telehealth: Payer: Self-pay | Admitting: Family Medicine

## 2015-02-05 VITALS — BP 114/74 | HR 92 | Temp 98.5°F | Wt 128.6 lb

## 2015-02-05 DIAGNOSIS — E1151 Type 2 diabetes mellitus with diabetic peripheral angiopathy without gangrene: Secondary | ICD-10-CM

## 2015-02-05 DIAGNOSIS — E785 Hyperlipidemia, unspecified: Secondary | ICD-10-CM | POA: Diagnosis not present

## 2015-02-05 DIAGNOSIS — J069 Acute upper respiratory infection, unspecified: Secondary | ICD-10-CM | POA: Diagnosis not present

## 2015-02-05 MED ORDER — FLUTICASONE PROPIONATE 50 MCG/ACT NA SUSP: 2.0000 | Freq: Every day | NASAL | Status: DC

## 2015-02-05 MED ORDER — AMOXICILLIN 875 MG PO TABS: 875.0000 mg | ORAL_TABLET | Freq: Two times a day (BID) | ORAL | Status: DC

## 2015-02-05 NOTE — Progress Notes (Signed)
Pre visit review using our clinic review tool, if applicable. No additional management support is needed unless otherwise documented below in the visit note. 

## 2015-02-05 NOTE — Patient Instructions (Signed)
Upper Respiratory Infection, Adult Most upper respiratory infections (URIs) are a viral infection of the air passages leading to the lungs. A URI affects the nose, throat, and upper air passages. The most common type of URI is nasopharyngitis and is typically referred to as "the common cold." URIs run their course and usually go away on their own. Most of the time, a URI does not require medical attention, but sometimes a bacterial infection in the upper airways can follow a viral infection. This is called a secondary infection. Sinus and middle ear infections are common types of secondary upper respiratory infections. Bacterial pneumonia can also complicate a URI. A URI can worsen asthma and chronic obstructive pulmonary disease (COPD). Sometimes, these complications can require emergency medical care and may be life threatening.  CAUSES Almost all URIs are caused by viruses. A virus is a type of germ and can spread from one person to another.  RISKS FACTORS You may be at risk for a URI if:   You smoke.   You have chronic heart or lung disease.  You have a weakened defense (immune) system.   You are very young or very old.   You have nasal allergies or asthma.  You work in crowded or poorly ventilated areas.  You work in health care facilities or schools. SIGNS AND SYMPTOMS  Symptoms typically develop 2-3 days after you come in contact with a cold virus. Most viral URIs last 7-10 days. However, viral URIs from the influenza virus (flu virus) can last 14-18 days and are typically more severe. Symptoms may include:   Runny or stuffy (congested) nose.   Sneezing.   Cough.   Sore throat.   Headache.   Fatigue.   Fever.   Loss of appetite.   Pain in your forehead, behind your eyes, and over your cheekbones (sinus pain).  Muscle aches.  DIAGNOSIS  Your health care provider may diagnose a URI by:  Physical exam.  Tests to check that your symptoms are not due to  another condition such as:  Strep throat.  Sinusitis.  Pneumonia.  Asthma. TREATMENT  A URI goes away on its own with time. It cannot be cured with medicines, but medicines may be prescribed or recommended to relieve symptoms. Medicines may help:  Reduce your fever.  Reduce your cough.  Relieve nasal congestion. HOME CARE INSTRUCTIONS   Take medicines only as directed by your health care provider.   Gargle warm saltwater or take cough drops to comfort your throat as directed by your health care provider.  Use a warm mist humidifier or inhale steam from a shower to increase air moisture. This may make it easier to breathe.  Drink enough fluid to keep your urine clear or pale yellow.   Eat soups and other clear broths and maintain good nutrition.   Rest as needed.   Return to work when your temperature has returned to normal or as your health care provider advises. You may need to stay home longer to avoid infecting others. You can also use a face mask and careful hand washing to prevent spread of the virus.  Increase the usage of your inhaler if you have asthma.   Do not use any tobacco products, including cigarettes, chewing tobacco, or electronic cigarettes. If you need help quitting, ask your health care provider. PREVENTION  The best way to protect yourself from getting a cold is to practice good hygiene.   Avoid oral or hand contact with people with cold   symptoms.   Wash your hands often if contact occurs.  There is no clear evidence that vitamin C, vitamin E, echinacea, or exercise reduces the chance of developing a cold. However, it is always recommended to get plenty of rest, exercise, and practice good nutrition.  SEEK MEDICAL CARE IF:   You are getting worse rather than better.   Your symptoms are not controlled by medicine.   You have chills.  You have worsening shortness of breath.  You have brown or red mucus.  You have yellow or brown nasal  discharge.  You have pain in your face, especially when you bend forward.  You have a fever.  You have swollen neck glands.  You have pain while swallowing.  You have white areas in the back of your throat. SEEK IMMEDIATE MEDICAL CARE IF:   You have severe or persistent:  Headache.  Ear pain.  Sinus pain.  Chest pain.  You have chronic lung disease and any of the following:  Wheezing.  Prolonged cough.  Coughing up blood.  A change in your usual mucus.  You have a stiff neck.  You have changes in your:  Vision.  Hearing.  Thinking.  Mood. MAKE SURE YOU:   Understand these instructions.  Will watch your condition.  Will get help right away if you are not doing well or get worse.   This information is not intended to replace advice given to you by your health care provider. Make sure you discuss any questions you have with your health care provider.   Document Released: 09/16/2000 Document Revised: 08/07/2014 Document Reviewed: 06/28/2013 Elsevier Interactive Patient Education 2016 [employer]  

## 2015-02-05 NOTE — Assessment & Plan Note (Signed)
Flonase, antihistamine Amoxil if symptoms do not improve

## 2015-02-05 NOTE — Telephone Encounter (Signed)
Noted. Pt has an appointment with Dr. Etter Sjogren today (02/05/15) at 4 pm.

## 2015-02-05 NOTE — Telephone Encounter (Signed)
[facility] - Client [place] Patient Name: AHNIYAH GIANCOLA DOB: 1951/01/08 Initial Comment Caller states she had ruptured disc surgery and flu shot recently, now with low fever, temp 99-100 Nurse Assessment Nurse: Mechele Dawley, RN, Amy Date/Time Eilene Ghazi Time): 02/05/2015 2:11:32 PM Confirm and document reason for call. If symptomatic, describe symptoms. ---CALLER STATES THAT SHE HAS BEEN HAVING FEVER FOR THE PAST 3-4 DAYS LOW GRADE. SHE STATES THAT SHE GOT FLU SHOT ON THE 13TH AND WANTED TO KNOW IF THAT COULD BE IT. SHE IS HAVING A LITTLE WEAKNESS ONLY. Has the patient traveled out of the country within the last 30 days? ---Not Applicable Does the patient have any new or worsening symptoms? ---Yes Will a triage be completed? ---Yes Related visit to physician within the last 2 weeks? ---Yes Does the PT have any chronic conditions? (i.e. diabetes, asthma, etc.) ---No Guidelines Guideline Title Affirmed Question Affirmed Notes Fever [1] Fever > 100.5 F (38.1 C) AND [2] surgery in the last month Final Disposition User See Physician within 4 Hours (or PCP triage) Mechele Dawley, RN, Amy Referrals REFERRED TO PCP OFFICE Disagree/Comply: Comply

## 2015-02-05 NOTE — Progress Notes (Signed)
Patient ID: [name], female    DOB: [birth year]  Age: 64 y.o. MRN: [id number]    Subjective:  Subjective HPI [name] presents for c/o congestion and bodyaches with fever x >1 week.  No sinus pressure, no cough.   Review of Systems  Constitutional: Negative for chills, diaphoresis, appetite change, fatigue and unexpected weight change.  HENT: Positive for congestion and postnasal drip.   Eyes: Negative for pain, redness and visual disturbance.  Respiratory: Negative for cough, chest tightness, shortness of breath and wheezing.   Cardiovascular: Negative for chest pain, palpitations and leg swelling.  Endocrine: Negative for cold intolerance, heat intolerance, polydipsia, polyphagia and polyuria.  Genitourinary: Negative for dysuria, frequency and difficulty urinating.  Neurological: Negative for dizziness, light-headedness, numbness and headaches.    History Past Medical History  Diagnosis Date  . Arthritis   . Diabetes mellitus   . Thyroid disease     She has past surgical history that includes Cholecystectomy; Abdominal hysterectomy; Back surgery; Tonsilectomy, adenoidectomy, bilateral myringotomy and tubes; and Thyroid surgery.   Her family history includes Alcohol abuse in her mother; Breast cancer in her cousin; Heart disease in her maternal grandfather; Stroke in her maternal grandmother and mother.She reports that she has been smoking.  She has never used smokeless tobacco. She reports that she does not drink alcohol or use illicit drugs.  Current Outpatient Prescriptions on File Prior to Visit  Medication Sig Dispense Refill  . albuterol (PROAIR HFA) 108 (90 BASE) MCG/ACT inhaler 2 puffs qid prn 1 Inhaler 1  . alendronate (FOSAMAX) 70 MG tablet Take 1 tablet (70 mg total) by mouth every 7 (seven) days. Take with a full glass of water on an empty stomach. 4 tablet 11  . ALPRAZolam (XANAX) 0.5 MG tablet Take 1 tablet (0.5 mg total) by mouth 3 (three) times daily as  needed for sleep or anxiety. 30 tablet 2  . CELEBREX 200 MG capsule TAKE ONE CAPSULE BY MOUTH ONCE DAILY 30 capsule 5  . FLUoxetine (PROZAC) 20 MG capsule TAKE 1 CAPSULE BY MOUTH DAILY 90 capsule 3  . glimepiride (AMARYL) 2 MG tablet TAKE ONE TABLET BY MOUTH EVERY MORNING BEFORE BREAKFAST 30 tablet 5  . hydrochlorothiazide (HYDRODIURIL) 25 MG tablet Take 1 tablet (25 mg total) by mouth daily. 90 tablet 1  . Insulin Glargine (LANTUS SOLOSTAR) 100 UNIT/ML SOPN Inject 10 units into the skin at bedtime-- labs are due now (Patient taking differently: Inject 15 Units into the skin at bedtime. ) 15 mL 0  . Insulin Pen Needle (NOVOTWIST) 32G X 5 MM MISC Use daily at Bedtime with Lantus 100 each 0  . levothyroxine (SYNTHROID, LEVOTHROID) 125 MCG tablet Take 1 tablet (125 mcg total) by mouth daily before breakfast. 90 tablet 0  . methocarbamol (ROBAXIN) 500 MG tablet     . pantoprazole (PROTONIX) 40 MG tablet Take 1 tablet (40 mg total) by mouth daily. 30 tablet 11  . tiZANidine (ZANAFLEX) 4 MG tablet Take 4 mg by mouth every 6 (six) hours as needed for muscle spasms.    . traMADol (ULTRAM) 50 MG tablet Take 1 tablet (50 mg total) by mouth every 8 (eight) hours as needed for pain. 30 tablet 0  . valACYclovir (VALTREX) 1000 MG tablet Take 1 tablet (1,000 mg total) by mouth daily. 90 tablet 3   No current facility-administered medications on file prior to visit.     Objective:  Objective Physical Exam  Constitutional: She is oriented to person,  place, and time. She appears well-developed and well-nourished.  HENT:  Head: Normocephalic and atraumatic.  Nose: No mucosal edema or rhinorrhea. Right sinus exhibits no maxillary sinus tenderness and no frontal sinus tenderness. Left sinus exhibits no maxillary sinus tenderness and no frontal sinus tenderness.  Mouth/Throat: No oropharyngeal exudate, posterior oropharyngeal edema, posterior oropharyngeal erythema or tonsillar abscesses.  + pnd, turb  errythematous and swollen + mild adenopathy  Eyes: Conjunctivae and EOM are normal.  Neck: Normal range of motion. Neck supple. No JVD present. Carotid bruit is not present. No thyromegaly present.  Cardiovascular: Normal rate, regular rhythm and normal heart sounds.   No murmur heard. Pulmonary/Chest: Effort normal and breath sounds normal. No respiratory distress. She has no wheezes. She has no rales. She exhibits no tenderness.  Musculoskeletal: She exhibits no edema.  Neurological: She is alert and oriented to person, place, and time.  Psychiatric: She has a normal mood and affect.   BP 114/74 mmHg  Pulse 92  Temp(Src) 98.5 F (36.9 C) (Oral)  Wt 128 lb 9.6 oz (58.333 kg)  SpO2 95% Wt Readings from Last 3 Encounters:  02/05/15 128 lb 9.6 oz (58.333 kg)  08/06/14 116 lb 6.4 oz (52.799 kg)  08/03/14 115 lb 6.4 oz (52.345 kg)     Lab Results  Component Value Date   WBC 6.3 08/03/2014   HGB 14.5 08/03/2014   HCT 42.7 08/03/2014   PLT 204.0 08/03/2014   GLUCOSE 97 08/06/2014   CHOL 176 08/06/2014   TRIG 157.0* 08/06/2014   HDL 43.70 08/06/2014   LDLDIRECT 188.4 06/04/2010   LDLCALC 101* 08/06/2014   ALT 25 08/06/2014   AST 26 08/06/2014   NA 136 08/06/2014   K 3.9 08/06/2014   CL 101 08/06/2014   CREATININE 0.67 08/06/2014   BUN 12 08/06/2014   CO2 23 08/06/2014   TSH 1.37 08/06/2014   HGBA1C 7.9* 08/06/2014   MICROALBUR 0.4 02/19/2014    Dg Chest 2 View  02/19/2014  CLINICAL DATA:  64 year old female with a history of cough and congestion. EXAM: CHEST - 2 VIEW COMPARISON:  Plain film 02/08/2013, chest CT 10/30/2005 FINDINGS: Cardiomediastinal silhouette projects within normal limits in size and contour. Calcifications of the aortic arch. No confluent airspace disease, pneumothorax, or pleural effusion. Chronic coarsening of the interstitial markings. No displaced fracture. Surgical changes of cholecystectomy. Surgical changes of the cervical region compatible with  anterior cervical discectomy with plate screw fixation. IMPRESSION: Coarsened interstitial markings can be seen with bronchitis or potentially respiratory bronchiolitis (if the patient is a smoker). No confluent airspace disease. Atherosclerosis. Signed, Dulcy Fanny. Earleen Newport, DO [facility] Radiology Electronically Signed   By: Corrie Mckusick D.O.   On: 02/19/2014 10:02   Mm Digital Screening Bilateral  02/21/2014  CLINICAL DATA:  Screening. EXAM: DIGITAL SCREENING BILATERAL MAMMOGRAM WITH CAD COMPARISON:  Previous exam(s). ACR Breast Density Category c: The breast tissue is heterogeneously dense, which may obscure small masses. FINDINGS: There are no findings suspicious for malignancy. Images were processed with CAD. IMPRESSION: No mammographic evidence of malignancy. A result letter of this screening mammogram will be mailed directly to the patient. RECOMMENDATION: Screening mammogram in one year. (Code:SM-B-01Y) BI-RADS CATEGORY  1: Negative. Electronically Signed   By: Shon Hale M.D.   On: 02/21/2014 16:05     Assessment & Plan:  Plan I have discontinued Ms. Bendavid's simvastatin. I am also having her start on fluticasone and amoxicillin. Additionally, I am having her maintain  her Insulin Pen Needle, alendronate, traMADol, Insulin Glargine, methocarbamol, albuterol, tiZANidine, valACYclovir, CELEBREX, FLUoxetine, levothyroxine, ALPRAZolam, pantoprazole, glimepiride, hydrochlorothiazide, and HYDROcodone-acetaminophen.  Meds ordered this encounter  Medications  . HYDROcodone-acetaminophen (NORCO/VICODIN) 5-325 MG tablet    Sig:   . fluticasone (FLONASE) 50 MCG/ACT nasal spray    Sig: Place 2 sprays into both nostrils daily.    Dispense:  16 g    Refill:  6  . amoxicillin (AMOXIL) 875 MG tablet    Sig: Take 1 tablet (875 mg total) by mouth 2 (two) times daily.    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  Garnet Koyanagi, DO

## 2015-02-08 ENCOUNTER — Telehealth: Payer: Self-pay

## 2015-02-08 DIAGNOSIS — F419 Anxiety disorder, unspecified: Secondary | ICD-10-CM

## 2015-02-08 MED ORDER — ALPRAZOLAM 0.5 MG PO TABS: 0.5000 mg | ORAL_TABLET | Freq: Three times a day (TID) | ORAL | Status: DC | PRN

## 2015-02-08 NOTE — Telephone Encounter (Signed)
Rx faxed.    KP 

## 2015-02-08 NOTE — Telephone Encounter (Signed)
Last seen 02/05/15 and 08/06/14 #30 with 2 refills.  No UDS.    Please advise     KP

## 2015-02-08 NOTE — Telephone Encounter (Signed)
Ok for #30 

## 2015-02-12 ENCOUNTER — Other Ambulatory Visit: Payer: Self-pay | Admitting: Family Medicine

## 2015-02-12 ENCOUNTER — Encounter: Payer: Self-pay | Admitting: Family Medicine

## 2015-02-12 DIAGNOSIS — J0110 Acute frontal sinusitis, unspecified: Secondary | ICD-10-CM

## 2015-02-12 MED ORDER — CLARITHROMYCIN ER 500 MG PO TB24: 1000.0000 mg | ORAL_TABLET | Freq: Every day | ORAL | Status: AC

## 2015-02-13 ENCOUNTER — Other Ambulatory Visit: Payer: Self-pay

## 2015-02-13 MED ORDER — GLIMEPIRIDE 2 MG PO TABS: 2.0000 mg | ORAL_TABLET | Freq: Every day | ORAL | Status: DC

## 2015-02-25 ENCOUNTER — Encounter: Payer: Managed Care, Other (non HMO) | Admitting: Family Medicine

## 2015-02-25 ENCOUNTER — Telehealth: Payer: Self-pay | Admitting: Family Medicine

## 2015-02-25 DIAGNOSIS — Z0289 Encounter for other administrative examinations: Secondary | ICD-10-CM

## 2015-03-06 NOTE — Telephone Encounter (Signed)
Per Marj pt states she left a VM and did not get a call back. Will mark as no charge.

## 2015-03-06 NOTE — Telephone Encounter (Signed)
Pt was no show 02/25/15 3:00pm for cpe appt, pt has not rescheduled, mailed letter, charge or no charge?

## 2015-03-07 NOTE — Telephone Encounter (Signed)
No charge. 

## 2015-03-12 ENCOUNTER — Telehealth: Payer: Self-pay

## 2015-03-12 MED ORDER — LEVOTHYROXINE SODIUM 125 MCG PO TABS: 125.0000 ug | ORAL_TABLET | Freq: Every day | ORAL | Status: DC

## 2015-03-12 MED ORDER — INSULIN GLARGINE 100 UNIT/ML SOLOSTAR PEN: PEN_INJECTOR | SUBCUTANEOUS | Status: DC

## 2015-03-12 NOTE — Telephone Encounter (Signed)
Rx request for Lantus 10 units SQ QHS  Last filled:  03/06/13 Amt: 15 ml, 0 Last OV:  02/05/15 Next appt scheduled: 03/28/15 for follow up   Pt states she does not take medication every night.  States she only takes 1-2 times per week.  Pt says she has 1 vial left.  Pt would also like a refill on levothyroxine (SYNTHROID, LEVOTHROID) 125 MCG tablet Last filled:  06/11/14   Amt: 90, 0  Please advise. Ok to refill?

## 2015-03-28 ENCOUNTER — Ambulatory Visit (INDEPENDENT_AMBULATORY_CARE_PROVIDER_SITE_OTHER): Payer: Managed Care, Other (non HMO) | Admitting: Family Medicine

## 2015-03-28 ENCOUNTER — Encounter: Payer: Self-pay | Admitting: Family Medicine

## 2015-03-28 ENCOUNTER — Other Ambulatory Visit (HOSPITAL_COMMUNITY)
Admission: RE | Admit: 2015-03-28 | Discharge: 2015-03-28 | Disposition: A | Discharge status: Home / Self Care | Payer: Managed Care, Other (non HMO) | Source: Ambulatory Visit | Attending: Family Medicine | Admitting: Family Medicine

## 2015-03-28 VITALS — BP 108/72 | HR 84 | Temp 97.7°F | Ht 60.0 in | Wt 133.0 lb

## 2015-03-28 DIAGNOSIS — M810 Age-related osteoporosis without current pathological fracture: Secondary | ICD-10-CM

## 2015-03-28 DIAGNOSIS — N760 Acute vaginitis: Secondary | ICD-10-CM | POA: Insufficient documentation

## 2015-03-28 DIAGNOSIS — Z114 Encounter for screening for human immunodeficiency virus [HIV]: Secondary | ICD-10-CM

## 2015-03-28 DIAGNOSIS — J069 Acute upper respiratory infection, unspecified: Secondary | ICD-10-CM

## 2015-03-28 DIAGNOSIS — E119 Type 2 diabetes mellitus without complications: Secondary | ICD-10-CM | POA: Diagnosis not present

## 2015-03-28 DIAGNOSIS — E785 Hyperlipidemia, unspecified: Secondary | ICD-10-CM | POA: Diagnosis not present

## 2015-03-28 DIAGNOSIS — Z113 Encounter for screening for infections with a predominantly sexual mode of transmission: Secondary | ICD-10-CM | POA: Diagnosis not present

## 2015-03-28 DIAGNOSIS — Z794 Long term (current) use of insulin: Secondary | ICD-10-CM | POA: Diagnosis not present

## 2015-03-28 DIAGNOSIS — E039 Hypothyroidism, unspecified: Secondary | ICD-10-CM | POA: Diagnosis not present

## 2015-03-28 DIAGNOSIS — N9489 Other specified conditions associated with female genital organs and menstrual cycle: Secondary | ICD-10-CM

## 2015-03-28 DIAGNOSIS — N898 Other specified noninflammatory disorders of vagina: Secondary | ICD-10-CM

## 2015-03-28 DIAGNOSIS — Z1159 Encounter for screening for other viral diseases: Secondary | ICD-10-CM

## 2015-03-28 LAB — CBC WITH DIFFERENTIAL/PLATELET
BASOS ABS: 0 10*3/uL (ref 0.0–0.1)
BASOS PCT: 0.4 % (ref 0.0–3.0)
EOS ABS: 0.2 10*3/uL (ref 0.0–0.7)
Eosinophils Relative: 2.7 % (ref 0.0–5.0)
HEMATOCRIT: 45.4 % (ref 36.0–46.0)
Hemoglobin: 14.9 g/dL (ref 12.0–15.0)
LYMPHS PCT: 19.6 % (ref 12.0–46.0)
Lymphs Abs: 1.5 10*3/uL (ref 0.7–4.0)
MCHC: 32.8 g/dL (ref 30.0–36.0)
MCV: 93.9 fl (ref 78.0–100.0)
MONO ABS: 0.6 10*3/uL (ref 0.1–1.0)
Monocytes Relative: 7.8 % (ref 3.0–12.0)
NEUTROS ABS: 5.3 10*3/uL (ref 1.4–7.7)
NEUTROS PCT: 69.5 % (ref 43.0–77.0)
PLATELETS: 227 10*3/uL (ref 150.0–400.0)
RBC: 4.84 Mil/uL (ref 3.87–5.11)
RDW: 13.5 % (ref 11.5–15.5)
WBC: 7.7 10*3/uL (ref 4.0–10.5)

## 2015-03-28 LAB — LIPID PANEL
CHOLESTEROL: 283 mg/dL — AB (ref 0–200)
HDL: 52.2 mg/dL (ref >39.00)
NonHDL: 231.19
TRIGLYCERIDES: 265 mg/dL — AB (ref 0.0–149.0)
Total CHOL/HDL Ratio: 5
VLDL: 53 mg/dL — ABNORMAL HIGH (ref 0.0–40.0)

## 2015-03-28 LAB — HEMOGLOBIN A1C: HEMOGLOBIN A1C: 8.8 % — AB (ref 4.6–6.5)

## 2015-03-28 LAB — COMPREHENSIVE METABOLIC PANEL
ALBUMIN: 4 g/dL (ref 3.5–5.2)
ALT: 24 U/L (ref 0–35)
AST: 19 U/L (ref 0–37)
Alkaline Phosphatase: 101 U/L (ref 39–117)
BILIRUBIN TOTAL: 0.4 mg/dL (ref 0.2–1.2)
BUN: 17 mg/dL (ref 6–23)
CALCIUM: 9.7 mg/dL (ref 8.4–10.5)
CO2: 33 meq/L — AB (ref 19–32)
CREATININE: 0.65 mg/dL (ref 0.40–1.20)
Chloride: 99 mEq/L (ref 96–112)
GFR: 97.31 mL/min (ref >60.00)
Glucose, Bld: 196 mg/dL — ABNORMAL HIGH (ref 70–99)
Potassium: 4.3 mEq/L (ref 3.5–5.1)
SODIUM: 137 meq/L (ref 135–145)
Total Protein: 7.9 g/dL (ref 6.0–8.3)

## 2015-03-28 LAB — LDL CHOLESTEROL, DIRECT: Direct LDL: 162 mg/dL

## 2015-03-28 LAB — TSH: TSH: 81.61 u[IU]/mL — AB (ref 0.35–4.50)

## 2015-03-28 LAB — HIV ANTIBODY (ROUTINE TESTING W REFLEX): HIV: NONREACTIVE

## 2015-03-28 LAB — MICROALBUMIN / CREATININE URINE RATIO
CREATININE, U: 179.1 mg/dL
Microalb Creat Ratio: 0.8 mg/g (ref 0.0–30.0)
Microalb, Ur: 1.5 mg/dL (ref 0.0–1.9)

## 2015-03-28 MED ORDER — LEVOTHYROXINE SODIUM 125 MCG PO TABS: 125.0000 ug | ORAL_TABLET | Freq: Every day | ORAL | Status: DC

## 2015-03-28 MED ORDER — FLUTICASONE PROPIONATE 50 MCG/ACT NA SUSP: 2.0000 | Freq: Every day | NASAL | Status: DC

## 2015-03-28 MED ORDER — ALENDRONATE SODIUM 70 MG PO TABS: 70.0000 mg | ORAL_TABLET | ORAL | Status: DC

## 2015-03-28 MED ORDER — GLIMEPIRIDE 2 MG PO TABS: 2.0000 mg | ORAL_TABLET | Freq: Every day | ORAL | Status: DC

## 2015-03-28 MED ORDER — LEVOCETIRIZINE DIHYDROCHLORIDE 5 MG PO TABS: 5.0000 mg | ORAL_TABLET | Freq: Every evening | ORAL | Status: DC

## 2015-03-28 MED ORDER — HYDROCHLOROTHIAZIDE 25 MG PO TABS: 25.0000 mg | ORAL_TABLET | Freq: Every day | ORAL | Status: DC

## 2015-03-28 NOTE — Progress Notes (Signed)
Pre visit review using our clinic review tool, if applicable. No additional management support is needed unless otherwise documented below in the visit note. 

## 2015-03-28 NOTE — Patient Instructions (Signed)

## 2015-03-28 NOTE — Progress Notes (Signed)
Patient ID: [name], female    DOB: 1950/07/16  Age: 64 y.o. MRN: [id number]    Subjective:  Subjective HPI [name] presents for f/u dm, and lipid and thyroid.  DIABETES  Blood Sugar ranges-120-150  Polyuria- no New Visual problems- no Hypoglycemic symptoms- no Other side effects-no Medication compliance - good Last eye exam- 10/2014 Foot exam- today  HYPERLIPIDEMIA  Medication compliance- no RUQ pain- no  Muscle aches- no Other side effects-no    Review of Systems  Constitutional: Negative for diaphoresis, appetite change, fatigue and unexpected weight change.  Eyes: Negative for pain, redness and visual disturbance.  Respiratory: Negative for cough, chest tightness, shortness of breath and wheezing.   Cardiovascular: Negative for chest pain, palpitations and leg swelling.  Endocrine: Negative for cold intolerance, heat intolerance, polydipsia, polyphagia and polyuria.  Genitourinary: Negative for dysuria, frequency and difficulty urinating.  Neurological: Negative for dizziness, light-headedness, numbness and headaches.    History Past Medical History  Diagnosis Date  . Arthritis   . Diabetes mellitus   . Thyroid disease     She has past surgical history that includes Cholecystectomy; Abdominal hysterectomy; Back surgery; Tonsilectomy, adenoidectomy, bilateral myringotomy and tubes; and Thyroid surgery.   Her family history includes Alcohol abuse in her mother; Breast cancer in her cousin; Heart disease in her maternal grandfather; Stroke in her maternal grandmother and mother.She reports that she quit smoking about 3 months ago. She has never used smokeless tobacco. She reports that she does not drink alcohol or use illicit drugs.  Current Outpatient Prescriptions on File Prior to Visit  Medication Sig Dispense Refill  . albuterol (PROAIR HFA) 108 (90 BASE) MCG/ACT inhaler 2 puffs qid prn 1 Inhaler 1  . ALPRAZolam (XANAX) 0.5 MG tablet Take 1 tablet (0.5  mg total) by mouth 3 (three) times daily as needed for sleep or anxiety. 30 tablet 0  . CELEBREX 200 MG capsule TAKE ONE CAPSULE BY MOUTH ONCE DAILY 30 capsule 5  . FLUoxetine (PROZAC) 20 MG capsule TAKE 1 CAPSULE BY MOUTH DAILY 90 capsule 3  . HYDROcodone-acetaminophen (NORCO/VICODIN) 5-325 MG tablet     . Insulin Glargine (LANTUS SOLOSTAR) 100 UNIT/ML Solostar Pen Inject 10 units into the skin at bedtime-- labs are due now 15 mL 0  . Insulin Pen Needle (NOVOTWIST) 32G X 5 MM MISC Use daily at Bedtime with Lantus 100 each 0  . methocarbamol (ROBAXIN) 500 MG tablet     . pantoprazole (PROTONIX) 40 MG tablet Take 1 tablet (40 mg total) by mouth daily. 30 tablet 11  . tiZANidine (ZANAFLEX) 4 MG tablet Take 4 mg by mouth every 6 (six) hours as needed for muscle spasms.    . traMADol (ULTRAM) 50 MG tablet Take 1 tablet (50 mg total) by mouth every 8 (eight) hours as needed for pain. 30 tablet 0  . valACYclovir (VALTREX) 1000 MG tablet Take 1 tablet (1,000 mg total) by mouth daily. 90 tablet 3   No current facility-administered medications on file prior to visit.     Objective:  Objective Physical Exam  Constitutional: She is oriented to person, place, and time. She appears well-developed and well-nourished.  HENT:  Head: Normocephalic and atraumatic.  Eyes: Conjunctivae and EOM are normal.  Neck: Normal range of motion. Neck supple. No JVD present. Carotid bruit is not present. No thyromegaly present.  Cardiovascular: Normal rate, regular rhythm and normal heart sounds.   No murmur heard. Pulmonary/Chest: Effort normal and breath sounds normal. No  respiratory distress. She has no wheezes. She has no rales. She exhibits no tenderness.  Musculoskeletal: She exhibits no edema.  Neurological: She is alert and oriented to person, place, and time.  Psychiatric: She has a normal mood and affect.  Nursing note and vitals reviewed.  BP 108/72 mmHg  Pulse 84  Temp(Src) 97.7 F (36.5 C) (Oral)   Ht 5' (1.524 m)  Wt 133 lb (60.328 kg)  BMI 25.97 kg/m2  SpO2 96% Wt Readings from Last 3 Encounters:  03/28/15 133 lb (60.328 kg)  02/05/15 128 lb 9.6 oz (58.333 kg)  08/06/14 116 lb 6.4 oz (52.799 kg)     Lab Results  Component Value Date   WBC 6.3 08/03/2014   HGB 14.5 08/03/2014   HCT 42.7 08/03/2014   PLT 204.0 08/03/2014   GLUCOSE 97 08/06/2014   CHOL 176 08/06/2014   TRIG 157.0* 08/06/2014   HDL 43.70 08/06/2014   LDLDIRECT 188.4 06/04/2010   LDLCALC 101* 08/06/2014   ALT 25 08/06/2014   AST 26 08/06/2014   NA 136 08/06/2014   K 3.9 08/06/2014   CL 101 08/06/2014   CREATININE 0.67 08/06/2014   BUN 12 08/06/2014   CO2 23 08/06/2014   TSH 1.37 08/06/2014   HGBA1C 7.9* 08/06/2014   MICROALBUR 0.4 02/19/2014    Dg Chest 2 View  02/19/2014  CLINICAL DATA:  65 year old female with a history of cough and congestion. EXAM: CHEST - 2 VIEW COMPARISON:  Plain film 02/08/2013, chest CT 10/30/2005 FINDINGS: Cardiomediastinal silhouette projects within normal limits in size and contour. Calcifications of the aortic arch. No confluent airspace disease, pneumothorax, or pleural effusion. Chronic coarsening of the interstitial markings. No displaced fracture. Surgical changes of cholecystectomy. Surgical changes of the cervical region compatible with anterior cervical discectomy with plate screw fixation. IMPRESSION: Coarsened interstitial markings can be seen with bronchitis or potentially respiratory bronchiolitis (if the patient is a smoker). No confluent airspace disease. Atherosclerosis. Signed, Dulcy Fanny. Earleen Newport, DO [facility]iology Electronically Signed   By: Corrie Mckusick D.O.   On: 02/19/2014 10:02   Mm Digital Screening Bilateral  02/21/2014  CLINICAL DATA:  Screening. EXAM: DIGITAL SCREENING BILATERAL MAMMOGRAM WITH CAD COMPARISON:  Previous exam(s). ACR Breast Density Category c: The breast tissue is heterogeneously  dense, which may obscure small masses. FINDINGS: There are no findings suspicious for malignancy. Images were processed with CAD. IMPRESSION: No mammographic evidence of malignancy. A result letter of this screening mammogram will be mailed directly to the patient. RECOMMENDATION: Screening mammogram in one year. (Code:SM-B-01Y) BI-RADS CATEGORY  1: Negative. Electronically Signed   By: Shon Hale M.D.   On: 02/21/2014 16:05     Assessment & Plan:  Plan I have changed Ms. Heatherington's glimepiride. I am also having her start on levocetirizine. Additionally, I am having her maintain her Insulin Pen Needle, traMADol, methocarbamol, albuterol, tiZANidine, valACYclovir, CELEBREX, FLUoxetine, pantoprazole, HYDROcodone-acetaminophen, ALPRAZolam, Insulin Glargine, alendronate, hydrochlorothiazide, levothyroxine, and fluticasone.  Meds ordered this encounter  Medications  . alendronate (FOSAMAX) 70 MG tablet    Sig: Take 1 tablet (70 mg total) by mouth every 7 (seven) days. Take with a full glass of water on an empty stomach.    Dispense:  4 tablet    Refill:  11  . glimepiride (AMARYL) 2 MG tablet    Sig: Take 1 tablet (2 mg total) by mouth daily with breakfast.    Dispense:  30 tablet    Refill:  5  .  hydrochlorothiazide (HYDRODIURIL) 25 MG tablet    Sig: Take 1 tablet (25 mg total) by mouth daily.    Dispense:  90 tablet    Refill:  1  . levothyroxine (SYNTHROID, LEVOTHROID) 125 MCG tablet    Sig: Take 1 tablet (125 mcg total) by mouth daily before breakfast.    Dispense:  30 tablet    Refill:  5  . levocetirizine (XYZAL) 5 MG tablet    Sig: Take 1 tablet (5 mg total) by mouth every evening.    Dispense:  30 tablet    Refill:  5  . fluticasone (FLONASE) 50 MCG/ACT nasal spray    Sig: Place 2 sprays into both nostrils daily.    Dispense:  16 g    Refill:  6    Problem List Items Addressed This Visit    Acute upper respiratory infection   Relevant Medications   levocetirizine (XYZAL) 5 MG  tablet   fluticasone (FLONASE) 50 MCG/ACT nasal spray    Other Visit Diagnoses    Type 2 diabetes mellitus without complication, with long-term current use of insulin ([facility])    -  Primary    Relevant Medications    glimepiride (AMARYL) 2 MG tablet    Other Relevant Orders    Urine Microalbumin w/creat. ratio    Comp Met (CMET)    CBC with Differential/Platelet    Hemoglobin A1c    Microalbumin / creatinine urine ratio    POCT urinalysis dipstick    Osteoporosis        Relevant Medications    alendronate (FOSAMAX) 70 MG tablet    Hyperlipidemia LDL goal <70        Relevant Medications    hydrochlorothiazide (HYDRODIURIL) 25 MG tablet    Other Relevant Orders    Comp Met (CMET)    Lipid panel    Microalbumin / creatinine urine ratio    POCT urinalysis dipstick    Hypothyroidism, unspecified hypothyroidism type        Relevant Medications    levothyroxine (SYNTHROID, LEVOTHROID) 125 MCG tablet    Other Relevant Orders    TSH    Vaginal odor        Relevant Orders    Urine cytology ancillary only    Need for hepatitis C screening test        Relevant Orders    Hepatitis C antibody    Screening for HIV (human immunodeficiency virus)        Relevant Orders    HIV antibody       Follow-up: Return in about 6 months (around 09/26/2015), or if symptoms worsen or fail to improve, for hyperlipidemia, diabetes II, annual exam, fasting.  Garnet Koyanagi, DO

## 2015-03-29 LAB — HEPATITIS C ANTIBODY: HCV AB: NEGATIVE

## 2015-03-29 LAB — URINE CYTOLOGY ANCILLARY ONLY
Chlamydia: NEGATIVE
NEISSERIA GONORRHEA: NEGATIVE
TRICH (WINDOWPATH): NEGATIVE

## 2015-04-03 ENCOUNTER — Telehealth: Payer: Self-pay

## 2015-04-03 DIAGNOSIS — E039 Hypothyroidism, unspecified: Secondary | ICD-10-CM

## 2015-04-03 LAB — URINE CYTOLOGY ANCILLARY ONLY
BACTERIAL VAGINITIS: POSITIVE — AB
CANDIDA VAGINITIS: NEGATIVE

## 2015-04-03 MED ORDER — LEVOTHYROXINE SODIUM 137 MCG PO TABS: 137.0000 ug | ORAL_TABLET | Freq: Every day | ORAL | Status: DC

## 2015-04-03 MED ORDER — INSULIN GLARGINE 100 UNIT/ML SOLOSTAR PEN: PEN_INJECTOR | SUBCUTANEOUS | Status: DC

## 2015-04-03 NOTE — Telephone Encounter (Signed)
Spoke with patient and she verbalized understanding, she had agreed to increase the Lantus to 15 units and the synthroid 137 mcg daily. Med's have been faxed and copy of the labs have been mailed.      KP

## 2015-04-03 NOTE — Telephone Encounter (Signed)
-----   Message from Rosalita Chessman, DO sent at 03/30/2015 10:56 PM EST ----- Hypothyroid-- inc synthroid to 137 mcg #30, 2 refills Recheck in 2 months Dm not controlled--- inc lantus to 15 u Cholesterol--- LDL goal < 100,  HDL >40,  TG < 150.  Diet and exercise will increase HDL and decrease LDL and TG.  Fish,  Fish Oil, Flaxseed oil will also help increase the HDL and decrease Triglycerides.   Recheck labs in 3 monhs Lipid, cmp, hgba1c, tsh .

## 2015-04-04 ENCOUNTER — Other Ambulatory Visit: Payer: Self-pay

## 2015-04-04 MED ORDER — METRONIDAZOLE 0.75 % VA GEL: 1.0000 | Freq: Every day | VAGINAL | Status: DC

## 2015-04-23 ENCOUNTER — Ambulatory Visit: Payer: Managed Care, Other (non HMO) | Admitting: Family Medicine

## 2015-04-23 ENCOUNTER — Telehealth: Payer: Self-pay | Admitting: Family Medicine

## 2015-04-23 ENCOUNTER — Ambulatory Visit (INDEPENDENT_AMBULATORY_CARE_PROVIDER_SITE_OTHER): Payer: Managed Care, Other (non HMO) | Admitting: Family Medicine

## 2015-04-23 ENCOUNTER — Encounter: Payer: Self-pay | Admitting: Family Medicine

## 2015-04-23 VITALS — BP 108/62 | HR 97 | Temp 98.2°F | Wt 130.0 lb

## 2015-04-23 DIAGNOSIS — Z794 Long term (current) use of insulin: Secondary | ICD-10-CM | POA: Diagnosis not present

## 2015-04-23 DIAGNOSIS — N390 Urinary tract infection, site not specified: Secondary | ICD-10-CM | POA: Diagnosis not present

## 2015-04-23 DIAGNOSIS — E1165 Type 2 diabetes mellitus with hyperglycemia: Secondary | ICD-10-CM

## 2015-04-23 DIAGNOSIS — E119 Type 2 diabetes mellitus without complications: Secondary | ICD-10-CM

## 2015-04-23 LAB — POCT URINALYSIS DIPSTICK
BILIRUBIN UA: NEGATIVE
GLUCOSE UA: NEGATIVE
Ketones, UA: NEGATIVE
Leukocytes, UA: NEGATIVE
NITRITE UA: NEGATIVE
Protein, UA: NEGATIVE
RBC UA: NEGATIVE
SPEC GRAV UA: 1.025
UROBILINOGEN UA: 0.2
pH, UA: 6

## 2015-04-23 MED ORDER — INSULIN GLARGINE 100 UNIT/ML SOLOSTAR PEN: PEN_INJECTOR | SUBCUTANEOUS | Status: DC

## 2015-04-23 NOTE — Assessment & Plan Note (Signed)
Inc Lantus to 20 u daily--- she will inc by 1 u a day as needed to

## 2015-04-23 NOTE — Telephone Encounter (Signed)
Pt has an appt scheduled with Dr. Etter Sjogren today (04/23/15) at 4:30 pm.

## 2015-04-23 NOTE — Telephone Encounter (Signed)
Patient called back at 1045 requesting to speak with CMA. No answer. States that her BS is still increasing. Transferred patient to [facility] @ 1045

## 2015-04-23 NOTE — Patient Instructions (Signed)
Get Debrox -- over the counter for your ears and come

## 2015-04-23 NOTE — Progress Notes (Signed)
Patient ID: [name], female    DOB: [birth year]  Age: 65 y.o. MRN: [id number]    Subjective:  Subjective HPI [name] presents for elevated bs---  Last night after supper 306,  8am 223, lunch today 406.    Review of Systems  Constitutional: Negative for diaphoresis, appetite change, fatigue and unexpected weight change.  Eyes: Negative for pain, redness and visual disturbance.  Respiratory: Negative for cough, chest tightness, shortness of breath and wheezing.   Cardiovascular: Negative for chest pain, palpitations and leg swelling.  Endocrine: Negative for cold intolerance, heat intolerance, polydipsia, polyphagia and polyuria.  Genitourinary: Negative for dysuria, frequency and difficulty urinating.  Neurological: Negative for dizziness, light-headedness, numbness and headaches.    History Past Medical History  Diagnosis Date  . Arthritis   . Diabetes mellitus   . Thyroid disease     She has past surgical history that includes Cholecystectomy; Abdominal hysterectomy; Back surgery; Tonsilectomy, adenoidectomy, bilateral myringotomy and tubes; and Thyroid surgery.   Her family history includes Alcohol abuse in her mother; Breast cancer in her cousin; Heart disease in her maternal grandfather; Stroke in her maternal grandmother and mother.She reports that she quit smoking about 4 months ago. She has never used smokeless tobacco. She reports that she does not drink alcohol or use illicit drugs.  Current Outpatient Prescriptions on File Prior to Visit  Medication Sig Dispense Refill  . albuterol (PROAIR HFA) 108 (90 BASE) MCG/ACT inhaler 2 puffs qid prn 1 Inhaler 1  . alendronate (FOSAMAX) 70 MG tablet Take 1 tablet (70 mg total) by mouth every 7 (seven) days. Take with a full glass of water on an empty stomach. 4 tablet 11  . ALPRAZolam (XANAX) 0.5 MG tablet Take 1 tablet (0.5 mg total) by mouth 3 (three) times daily as needed for sleep or anxiety. 30 tablet 0  . CELEBREX  200 MG capsule TAKE ONE CAPSULE BY MOUTH ONCE DAILY 30 capsule 5  . FLUoxetine (PROZAC) 20 MG capsule TAKE 1 CAPSULE BY MOUTH DAILY 90 capsule 3  . fluticasone (FLONASE) 50 MCG/ACT nasal spray Place 2 sprays into both nostrils daily. 16 g 6  . glimepiride (AMARYL) 2 MG tablet Take 1 tablet (2 mg total) by mouth daily with breakfast. 30 tablet 5  . hydrochlorothiazide (HYDRODIURIL) 25 MG tablet Take 1 tablet (25 mg total) by mouth daily. 90 tablet 1  . HYDROcodone-acetaminophen (NORCO/VICODIN) 5-325 MG tablet     . Insulin Pen Needle (NOVOTWIST) 32G X 5 MM MISC Use daily at Bedtime with Lantus 100 each 0  . levocetirizine (XYZAL) 5 MG tablet Take 1 tablet (5 mg total) by mouth every evening. 30 tablet 5  . levothyroxine (SYNTHROID, LEVOTHROID) 137 MCG tablet Take 1 tablet (137 mcg total) by mouth daily before breakfast. 30 tablet 2  . methocarbamol (ROBAXIN) 500 MG tablet     . metroNIDAZOLE (METROGEL VAGINAL) 0.75 % vaginal gel Place 1 Applicatorful vaginally at bedtime. x's 5 days 70 g 0  . pantoprazole (PROTONIX) 40 MG tablet Take 1 tablet (40 mg total) by mouth daily. 30 tablet 11  . tiZANidine (ZANAFLEX) 4 MG tablet Take 4 mg by mouth every 6 (six) hours as needed for muscle spasms.    . traMADol (ULTRAM) 50 MG tablet Take 1 tablet (50 mg total) by mouth every 8 (eight) hours as needed for pain. 30 tablet 0  . valACYclovir (VALTREX) 1000 MG tablet Take 1 tablet (1,000 mg total) by mouth daily. 90 tablet 3  No current facility-administered medications on file prior to visit.     Objective:  Objective Physical Exam  Constitutional: She is oriented to person, place, and time. She appears well-developed and well-nourished.  HENT:  Head: Normocephalic and atraumatic.  Eyes: Conjunctivae and EOM are normal.  Neck: Normal range of motion. Neck supple. No JVD present. Carotid bruit is not present. No thyromegaly present.  Cardiovascular: Normal rate, regular rhythm and normal heart sounds.     No murmur heard. Pulmonary/Chest: Effort normal and breath sounds normal. No respiratory distress. She has no wheezes. She has no rales. She exhibits no tenderness.  Musculoskeletal: She exhibits no edema.  Neurological: She is alert and oriented to person, place, and time.  Psychiatric: She has a normal mood and affect.  Nursing note and vitals reviewed.  BP 108/62 mmHg  Pulse 97  Temp(Src) 98.2 F (36.8 C) (Oral)  Wt 130 lb (58.968 kg)  SpO2 94% Wt Readings from Last 3 Encounters:  04/23/15 130 lb (58.968 kg)  03/28/15 133 lb (60.328 kg)  02/05/15 128 lb 9.6 oz (58.333 kg)     Lab Results  Component Value Date   WBC 7.7 03/28/2015   HGB 14.9 03/28/2015   HCT 45.4 03/28/2015   PLT 227.0 03/28/2015   GLUCOSE 196* 03/28/2015   CHOL 283* 03/28/2015   TRIG 265.0* 03/28/2015   HDL 52.20 03/28/2015   LDLDIRECT 162.0 03/28/2015   LDLCALC 101* 08/06/2014   ALT 24 03/28/2015   AST 19 03/28/2015   NA 137 03/28/2015   K 4.3 03/28/2015   CL 99 03/28/2015   CREATININE 0.65 03/28/2015   BUN 17 03/28/2015   CO2 33* 03/28/2015   TSH 81.61* 03/28/2015   HGBA1C 8.8* 03/28/2015   MICROALBUR 1.5 03/28/2015    No results found.   Assessment & Plan:  Plan I have changed Ms. Biagini's Insulin Glargine. I am also having her maintain her Insulin Pen Needle, traMADol, methocarbamol, albuterol, tiZANidine, valACYclovir, CELEBREX, FLUoxetine, pantoprazole, HYDROcodone-acetaminophen, ALPRAZolam, alendronate, glimepiride, hydrochlorothiazide, levocetirizine, fluticasone, levothyroxine, and metroNIDAZOLE.  Meds ordered this encounter  Medications  . Insulin Glargine (LANTUS SOLOSTAR) 100 UNIT/ML Solostar Pen    Sig: Inject 20 units into the skin at bedtime    Dispense:  15 mL    Refill:  1    This is a new dose based on recent labs    Problem List Items Addressed This Visit    None    Visit Diagnoses    Urinary tract infection without hematuria, site unspecified    -  Primary     Relevant Orders    POCT Urinalysis Dipstick (Completed)    Diabetes mellitus, type II, insulin dependent (HCC)        Relevant Medications    Insulin Glargine (LANTUS SOLOSTAR) 100 UNIT/ML Solostar Pen       Follow-up: Return in about 3 days (around 04/26/2015), or if symptoms worsen or fail to improve.  Garnet Koyanagi, DO

## 2015-04-23 NOTE — Telephone Encounter (Signed)
Last night blood sugar was 356 after eating supper (meatloaf and cabbage and 1 tablespoon of pintos) and took her shot before sugar. This morning fasting 224. She isn't sure what to do. A couple weeks ago she had a bladder infection and she is wondering if that is part of it. She took all the meds. She is still hurting in the bottom of her stomach.  Please call when available.

## 2015-04-23 NOTE — Telephone Encounter (Signed)
[facility] - Client [facility] Patient Name: [name] DOB: Oct 02, 1950 Initial Comment Caller states BS has been higher than normal- 324 Nurse Assessment Nurse: Mechele Dawley, RN, Amy Date/Time (Eastern Time): 04/23/2015 10:55:22 AM Confirm and document reason for call. If symptomatic, describe symptoms. You must click the next button to save text entered. ---BS LEVEL IS HIGHER THAN NORMAL. 400 YESTERDAY, TOOK HER INSULIN, 345, THEN 228. THIS MORNING IT WAS 224. SHE HAD NOT EAT ANYTHING. SHE AT [employer], BREAD, COFFEE, 324 NOW. SHE WAS IN OFFICE FOR UTI. IT CLEARED AND THEN WHEN FINISHED MEDS IT STARTED HURTING AGAIN. SHE THINKS MAYBE THIS IS THE ISSUE. NO FEVER. Has the patient traveled out of the country within the last 30 days? ---Not Applicable Does the patient have any new or worsening symptoms? ---Yes Will a triage be completed? ---Yes Related visit to physician within the last 2 weeks? ---No Does the PT have any chronic conditions? (i.e. diabetes, asthma, etc.) ---Yes List chronic conditions. ---PLEASE SEE EPIC Is this a behavioral health or substance abuse call? ---No Guidelines Guideline Title Affirmed Question Affirmed Notes Urinary Symptoms Side (flank) or lower back pain present Final Disposition User See Physician within [place], RN, Amy Referrals REFERRED TO PCP OFFICE Disagree/Comply: Comply

## 2015-04-23 NOTE — Progress Notes (Signed)
Pre visit review using our clinic review tool, if applicable. No additional management support is needed unless otherwise documented below in the visit note. 

## 2015-04-26 ENCOUNTER — Ambulatory Visit (HOSPITAL_BASED_OUTPATIENT_CLINIC_OR_DEPARTMENT_OTHER)
Admission: RE | Admit: 2015-04-26 | Discharge: 2015-04-26 | Disposition: A | Discharge status: Home / Self Care | Payer: Managed Care, Other (non HMO) | Source: Ambulatory Visit | Attending: Family Medicine | Admitting: Family Medicine

## 2015-04-26 ENCOUNTER — Ambulatory Visit (INDEPENDENT_AMBULATORY_CARE_PROVIDER_SITE_OTHER): Payer: Managed Care, Other (non HMO) | Admitting: Family Medicine

## 2015-04-26 ENCOUNTER — Encounter: Payer: Self-pay | Admitting: Family Medicine

## 2015-04-26 ENCOUNTER — Other Ambulatory Visit: Payer: Self-pay | Admitting: Family Medicine

## 2015-04-26 VITALS — BP 110/70 | HR 115 | Temp 98.3°F | Ht 60.0 in | Wt 131.6 lb

## 2015-04-26 DIAGNOSIS — Z794 Long term (current) use of insulin: Secondary | ICD-10-CM

## 2015-04-26 DIAGNOSIS — M25551 Pain in right hip: Secondary | ICD-10-CM | POA: Insufficient documentation

## 2015-04-26 DIAGNOSIS — E1151 Type 2 diabetes mellitus with diabetic peripheral angiopathy without gangrene: Secondary | ICD-10-CM | POA: Diagnosis not present

## 2015-04-26 DIAGNOSIS — E1165 Type 2 diabetes mellitus with hyperglycemia: Secondary | ICD-10-CM

## 2015-04-26 DIAGNOSIS — IMO0002 Reserved for concepts with insufficient information to code with codable children: Secondary | ICD-10-CM

## 2015-04-26 MED ORDER — INSULIN GLARGINE 100 UNIT/ML ~~LOC~~ SOLN: SUBCUTANEOUS | Status: DC

## 2015-04-26 NOTE — Progress Notes (Signed)
Patient ID: [name], female    DOB: 1950-11-25  Age: 65 y.o. MRN: [id number]    Subjective:  Subjective HPI Charnette A Manwarren presents c/o R hip pain.  Pt fell out of the bathtub about 3 weeks ago and R hip has hurt since.   She is also c/o BS running high----- she admits to eating a lot of carbs---she didn't  realize how quickly blood sugar can go up  She is also c/o ears feeling clogged and would like them cleaned out.    Review of Systems  Constitutional: Negative for diaphoresis, appetite change, fatigue and unexpected weight change.  Eyes: Negative for pain, redness and visual disturbance.  Respiratory: Negative for cough, chest tightness, shortness of breath and wheezing.   Cardiovascular: Negative for chest pain, palpitations and leg swelling.  Endocrine: Negative for cold intolerance, heat intolerance, polydipsia, polyphagia and polyuria.  Genitourinary: Negative for dysuria, frequency and difficulty urinating.  Musculoskeletal: Positive for arthralgias.       R hip pain  Neurological: Negative for dizziness, light-headedness, numbness and headaches.    History Past Medical History  Diagnosis Date  . Arthritis   . Diabetes mellitus   . Thyroid disease     She has past surgical history that includes Cholecystectomy; Abdominal hysterectomy; Back surgery; Tonsilectomy, adenoidectomy, bilateral myringotomy and tubes; and Thyroid surgery.   Her family history includes Alcohol abuse in her mother; Breast cancer in her cousin; Heart disease in her maternal grandfather; Stroke in her maternal grandmother and mother.She reports that she quit smoking about 4 months ago. She has never used smokeless tobacco. She reports that she does not drink alcohol or use illicit drugs.  Current Outpatient Prescriptions on File Prior to Visit  Medication Sig Dispense Refill  . albuterol (PROAIR HFA) 108 (90 BASE) MCG/ACT inhaler 2 puffs qid prn 1 Inhaler 1  . alendronate (FOSAMAX) 70 MG tablet  Take 1 tablet (70 mg total) by mouth every 7 (seven) days. Take with a full glass of water on an empty stomach. 4 tablet 11  . ALPRAZolam (XANAX) 0.5 MG tablet Take 1 tablet (0.5 mg total) by mouth 3 (three) times daily as needed for sleep or anxiety. 30 tablet 0  . CELEBREX 200 MG capsule TAKE ONE CAPSULE BY MOUTH ONCE DAILY 30 capsule 5  . FLUoxetine (PROZAC) 20 MG capsule TAKE 1 CAPSULE BY MOUTH DAILY 90 capsule 3  . fluticasone (FLONASE) 50 MCG/ACT nasal spray Place 2 sprays into both nostrils daily. 16 g 6  . glimepiride (AMARYL) 2 MG tablet Take 1 tablet (2 mg total) by mouth daily with breakfast. 30 tablet 5  . hydrochlorothiazide (HYDRODIURIL) 25 MG tablet Take 1 tablet (25 mg total) by mouth daily. 90 tablet 1  . HYDROcodone-acetaminophen (NORCO/VICODIN) 5-325 MG tablet     . Insulin Pen Needle (NOVOTWIST) 32G X 5 MM MISC Use daily at Bedtime with Lantus 100 each 0  . levocetirizine (XYZAL) 5 MG tablet Take 1 tablet (5 mg total) by mouth every evening. 30 tablet 5  . levothyroxine (SYNTHROID, LEVOTHROID) 137 MCG tablet Take 1 tablet (137 mcg total) by mouth daily before breakfast. 30 tablet 2  . methocarbamol (ROBAXIN) 500 MG tablet     . metroNIDAZOLE (METROGEL VAGINAL) 0.75 % vaginal gel Place 1 Applicatorful vaginally at bedtime. x's 5 days 70 g 0  . pantoprazole (PROTONIX) 40 MG tablet Take 1 tablet (40 mg total) by mouth daily. 30 tablet 11  . tiZANidine (ZANAFLEX) 4 MG tablet  Take 4 mg by mouth every 6 (six) hours as needed for muscle spasms.    . traMADol (ULTRAM) 50 MG tablet Take 1 tablet (50 mg total) by mouth every 8 (eight) hours as needed for pain. 30 tablet 0  . valACYclovir (VALTREX) 1000 MG tablet Take 1 tablet (1,000 mg total) by mouth daily. 90 tablet 3   No current facility-administered medications on file prior to visit.     Objective:  Objective Physical Exam  Constitutional: She is oriented to person, place, and time. She appears well-developed and  well-nourished.  HENT:  Head: Normocephalic and atraumatic.  Ears:  Eyes: Conjunctivae and EOM are normal.  Neck: Normal range of motion. Neck supple. No JVD present. Carotid bruit is not present. No thyromegaly present.  Cardiovascular: Normal rate, regular rhythm and normal heart sounds.   No murmur heard. Pulmonary/Chest: Effort normal and breath sounds normal. No respiratory distress. She has no wheezes. She has no rales. She exhibits no tenderness.  Musculoskeletal: She exhibits no edema.  Neurological: She is alert and oriented to person, place, and time.  Psychiatric: She has a normal mood and affect. Her behavior is normal.  Nursing note and vitals reviewed.  BP 110/70 mmHg  Pulse 115  Temp(Src) 98.3 F (36.8 C) (Oral)  Ht 5' (1.524 m)  Wt 131 lb 9.6 oz (59.693 kg)  BMI 25.70 kg/m2  SpO2 95% Wt Readings from Last 3 Encounters:  04/26/15 131 lb 9.6 oz (59.693 kg)  04/23/15 130 lb (58.968 kg)  03/28/15 133 lb (60.328 kg)     Lab Results  Component Value Date   WBC 7.7 03/28/2015   HGB 14.9 03/28/2015   HCT 45.4 03/28/2015   PLT 227.0 03/28/2015   GLUCOSE 196* 03/28/2015   CHOL 283* 03/28/2015   TRIG 265.0* 03/28/2015   HDL 52.20 03/28/2015   LDLDIRECT 162.0 03/28/2015   LDLCALC 101* 08/06/2014   ALT 24 03/28/2015   AST 19 03/28/2015   NA 137 03/28/2015   K 4.3 03/28/2015   CL 99 03/28/2015   CREATININE 0.65 03/28/2015   BUN 17 03/28/2015   CO2 33* 03/28/2015   TSH 81.61* 03/28/2015   HGBA1C 8.8* 03/28/2015   MICROALBUR 1.5 03/28/2015    No results found.   Assessment & Plan:  Plan I have discontinued Ms. Fulgham's Insulin Glargine. I am also having her start on insulin glargine. Additionally, I am having her maintain her Insulin Pen Needle, traMADol, methocarbamol, albuterol, tiZANidine, valACYclovir, CELEBREX, FLUoxetine, pantoprazole, HYDROcodone-acetaminophen, ALPRAZolam, alendronate, glimepiride, hydrochlorothiazide, levocetirizine, fluticasone,  levothyroxine, and metroNIDAZOLE.  Meds ordered this encounter  Medications  . insulin glargine (LANTUS) 100 UNIT/ML injection    Sig: 25u SQ q pm    Dispense:  10 mL    Refill:  11    Problem List Items Addressed This Visit      Unprioritized   Diabetes mellitus type II, uncontrolled ([place])    Increase lantus to 25 u daily Call us with glucose readings      Relevant Medications   insulin glargine (LANTUS) 100 UNIT/ML injection    Other Visit Diagnoses    Type II diabetes mellitus with peripheral circulatory disorder, uncontrolled (HCC)    -  Primary    Relevant Medications    insulin glargine (LANTUS) 100 UNIT/ML injection    Right hip pain        Relevant Orders    DG HIP UNILAT WITH PELVIS 2-3 VIEWS RIGHT       Follow-up: Return  in about 2 weeks (around 05/10/2015), or if symptoms worsen or fail to improve, for diabetes II.  Garnet Koyanagi, DO

## 2015-04-26 NOTE — Progress Notes (Signed)
Pre visit review using our clinic review tool, if applicable. No additional management support is needed unless otherwise documented below in the visit note. 

## 2015-04-26 NOTE — Patient Instructions (Signed)

## 2015-04-27 NOTE — Assessment & Plan Note (Signed)
Increase lantus to 25 u daily Call us with glucose readings

## 2015-05-15 ENCOUNTER — Telehealth: Payer: Self-pay | Admitting: Family Medicine

## 2015-05-15 DIAGNOSIS — Z794 Long term (current) use of insulin: Secondary | ICD-10-CM

## 2015-05-15 DIAGNOSIS — E119 Type 2 diabetes mellitus without complications: Secondary | ICD-10-CM

## 2015-05-15 NOTE — Telephone Encounter (Signed)
Pt called to get referral to endocrinology for diabetes mgmt  Delrae Rend MD  Endocrinologist in [address], [place]  Address: [address] [place], [place] [address]  Phone: [phone]

## 2015-05-16 NOTE — Telephone Encounter (Signed)
Please advise      KP 

## 2015-05-16 NOTE — Telephone Encounter (Signed)
Ref placed.      KP 

## 2015-05-16 NOTE — Telephone Encounter (Signed)
Ok to refer.

## 2015-05-23 ENCOUNTER — Telehealth: Payer: Self-pay | Admitting: Family Medicine

## 2015-05-23 NOTE — Telephone Encounter (Signed)
Please advise what you would like for the patient to do?      KP

## 2015-05-23 NOTE — Telephone Encounter (Signed)
Pt said her blood sugar is consistently running above 200-300 for the last 2 weeks. She is concerned about med dose. Please call at [phone].

## 2015-05-23 NOTE — Telephone Encounter (Signed)
Increase the amaryl to 2 mg bid

## 2015-05-23 NOTE — Telephone Encounter (Signed)
Spoke with patient and she has been made aware to watch her diet and to take the 2 Amaryl daily, continue to watch her BS and call and report if it doesn't improve.    KP

## 2015-06-10 DIAGNOSIS — G894 Chronic pain syndrome: Secondary | ICD-10-CM | POA: Diagnosis not present

## 2015-06-10 DIAGNOSIS — M542 Cervicalgia: Secondary | ICD-10-CM | POA: Diagnosis not present

## 2015-06-13 ENCOUNTER — Other Ambulatory Visit: Payer: Self-pay

## 2015-06-17 DIAGNOSIS — Z01419 Encounter for gynecological examination (general) (routine) without abnormal findings: Secondary | ICD-10-CM | POA: Diagnosis not present

## 2015-06-17 DIAGNOSIS — B373 Candidiasis of vulva and vagina: Secondary | ICD-10-CM | POA: Diagnosis not present

## 2015-07-02 ENCOUNTER — Other Ambulatory Visit: Payer: Self-pay | Admitting: Family Medicine

## 2015-07-02 DIAGNOSIS — E039 Hypothyroidism, unspecified: Secondary | ICD-10-CM

## 2015-07-02 NOTE — Telephone Encounter (Signed)
Please schedule the patient a lab apt.     KP

## 2015-07-02 NOTE — Telephone Encounter (Signed)
Rx faxed.    KP 

## 2015-07-02 NOTE — Telephone Encounter (Signed)
Patient scheduled labs only for 07/08/15

## 2015-07-08 ENCOUNTER — Other Ambulatory Visit: Payer: Self-pay

## 2015-07-08 ENCOUNTER — Other Ambulatory Visit: Payer: Managed Care, Other (non HMO)

## 2015-07-08 ENCOUNTER — Encounter: Payer: Self-pay | Admitting: Family Medicine

## 2015-07-08 ENCOUNTER — Ambulatory Visit (INDEPENDENT_AMBULATORY_CARE_PROVIDER_SITE_OTHER): Payer: PPO | Admitting: Family Medicine

## 2015-07-08 VITALS — BP 120/80 | HR 98 | Temp 99.1°F | Ht 60.0 in | Wt 132.6 lb

## 2015-07-08 DIAGNOSIS — M6530 Trigger finger, unspecified finger: Secondary | ICD-10-CM

## 2015-07-08 DIAGNOSIS — Z23 Encounter for immunization: Secondary | ICD-10-CM

## 2015-07-08 DIAGNOSIS — E1151 Type 2 diabetes mellitus with diabetic peripheral angiopathy without gangrene: Secondary | ICD-10-CM | POA: Diagnosis not present

## 2015-07-08 DIAGNOSIS — IMO0002 Reserved for concepts with insufficient information to code with codable children: Secondary | ICD-10-CM

## 2015-07-08 DIAGNOSIS — I10 Essential (primary) hypertension: Secondary | ICD-10-CM

## 2015-07-08 DIAGNOSIS — G4733 Obstructive sleep apnea (adult) (pediatric): Secondary | ICD-10-CM | POA: Insufficient documentation

## 2015-07-08 DIAGNOSIS — E785 Hyperlipidemia, unspecified: Secondary | ICD-10-CM | POA: Diagnosis not present

## 2015-07-08 DIAGNOSIS — E1165 Type 2 diabetes mellitus with hyperglycemia: Secondary | ICD-10-CM | POA: Diagnosis not present

## 2015-07-08 DIAGNOSIS — F419 Anxiety disorder, unspecified: Secondary | ICD-10-CM | POA: Diagnosis not present

## 2015-07-08 LAB — COMPREHENSIVE METABOLIC PANEL
ALK PHOS: 75 U/L (ref 39–117)
ALT: 21 U/L (ref 0–35)
AST: 19 U/L (ref 0–37)
Albumin: 4 g/dL (ref 3.5–5.2)
BILIRUBIN TOTAL: 0.5 mg/dL (ref 0.2–1.2)
BUN: 17 mg/dL (ref 6–23)
CALCIUM: 9.8 mg/dL (ref 8.4–10.5)
CO2: 32 mEq/L (ref 19–32)
Chloride: 101 mEq/L (ref 96–112)
Creatinine, Ser: 0.57 mg/dL (ref 0.40–1.20)
GFR: 113.14 mL/min (ref >60.00)
GLUCOSE: 114 mg/dL — AB (ref 70–99)
POTASSIUM: 4.4 meq/L (ref 3.5–5.1)
Sodium: 139 mEq/L (ref 135–145)
TOTAL PROTEIN: 7.5 g/dL (ref 6.0–8.3)

## 2015-07-08 LAB — LIPID PANEL
CHOL/HDL RATIO: 4
Cholesterol: 163 mg/dL (ref 0–200)
HDL: 45 mg/dL (ref >39.00)
LDL Cholesterol: 90 mg/dL (ref 0–99)
NONHDL: 118.43
Triglycerides: 143 mg/dL (ref 0.0–149.0)
VLDL: 28.6 mg/dL (ref 0.0–40.0)

## 2015-07-08 LAB — HEMOGLOBIN A1C: HEMOGLOBIN A1C: 9.3 % — AB (ref 4.6–6.5)

## 2015-07-08 MED ORDER — ALPRAZOLAM 0.5 MG PO TABS: 0.5000 mg | ORAL_TABLET | Freq: Three times a day (TID) | ORAL | Status: DC | PRN

## 2015-07-08 MED ORDER — INSULIN GLARGINE 100 UNIT/ML ~~LOC~~ SOLN: SUBCUTANEOUS | Status: DC

## 2015-07-08 NOTE — Progress Notes (Signed)
Patient ID: [name], female    DOB: [birth year]  Age: 65 y.o. MRN: [id number]    Subjective:  Subjective HPI [name] presents to discuss sleep apnea -- she was dx 20 years ago but never had a cpap or other treatment.  Pt is snoring and stops breathing at night.  She is tired during the day.   She is also c/o trigger finger in both hands causing pain Also f/u dm, cholesterol and htn.   No complaints.  HPI HYPERTENSION  Blood pressure range-not checking  Chest pain- no      Dyspnea- no Lightheadedness- no   Edema- no Other side effects - no   Medication compliance: good Low salt diet- yes  DIABETES  Blood Sugar ranges-elevated  Polyuria- no New Visual problems- no Hypoglycemic symptoms- nono Other side effects-no Medication compliance - good Last eye exam- per pt Foot exam- today  HYPERLIPIDEMIA  Medication compliance- good RUQ pain- no  Muscle aches- no Other side effects-no      Review of Systems  Constitutional: Negative for diaphoresis, appetite change, fatigue and unexpected weight change.  Eyes: Negative for pain, redness and visual disturbance.  Respiratory: Negative for cough, chest tightness, shortness of breath and wheezing.   Cardiovascular: Negative for chest pain, palpitations and leg swelling.  Endocrine: Negative for cold intolerance, heat intolerance, polydipsia, polyphagia and polyuria.  Genitourinary: Negative for dysuria, frequency and difficulty urinating.  Neurological: Negative for dizziness, light-headedness, numbness and headaches.    History Past Medical History  Diagnosis Date  . Arthritis   . Diabetes mellitus   . Thyroid disease     She has past surgical history that includes Cholecystectomy; Abdominal hysterectomy; Back surgery; Tonsilectomy, adenoidectomy, bilateral myringotomy and tubes; and Thyroid surgery.   Her family history includes Alcohol abuse in her mother; Breast cancer in her cousin; Heart disease in her  maternal grandfather; Stroke in her maternal grandmother and mother.She reports that she has been smoking.  She has never used smokeless tobacco. She reports that she does not drink alcohol or use illicit drugs.  Current Outpatient Prescriptions on File Prior to Visit  Medication Sig Dispense Refill  . albuterol (PROAIR HFA) 108 (90 BASE) MCG/ACT inhaler 2 puffs qid prn 1 Inhaler 1  . alendronate (FOSAMAX) 70 MG tablet Take 1 tablet (70 mg total) by mouth every 7 (seven) days. Take with a full glass of water on an empty stomach. 4 tablet 11  . CELEBREX 200 MG capsule TAKE ONE CAPSULE BY MOUTH ONCE DAILY 30 capsule 5  . FLUoxetine (PROZAC) 20 MG capsule TAKE 1 CAPSULE BY MOUTH DAILY 90 capsule 3  . fluticasone (FLONASE) 50 MCG/ACT nasal spray Place 2 sprays into both nostrils daily. 16 g 6  . glimepiride (AMARYL) 2 MG tablet Take 1 tablet (2 mg total) by mouth daily with breakfast. 30 tablet 5  . hydrochlorothiazide (HYDRODIURIL) 25 MG tablet Take 1 tablet (25 mg total) by mouth daily. 90 tablet 1  . HYDROcodone-acetaminophen (NORCO/VICODIN) 5-325 MG tablet     . Insulin Pen Needle (NOVOTWIST) 32G X 5 MM MISC Use daily at Bedtime with Lantus 100 each 0  . levocetirizine (XYZAL) 5 MG tablet Take 1 tablet (5 mg total) by mouth every evening. 30 tablet 5  . levothyroxine (SYNTHROID, LEVOTHROID) 137 MCG tablet TAKE ONE TABLET (137 MCG) BY MOUTH DAILY BEFORE BREAKFAST. 30 tablet 0  . methocarbamol (ROBAXIN) 500 MG tablet     . metroNIDAZOLE (METROGEL VAGINAL) 0.75 % vaginal  gel Place 1 Applicatorful vaginally at bedtime. x's 5 days 70 g 0  . pantoprazole (PROTONIX) 40 MG tablet Take 1 tablet (40 mg total) by mouth daily. 30 tablet 11  . tiZANidine (ZANAFLEX) 4 MG tablet Take 4 mg by mouth every 6 (six) hours as needed for muscle spasms.    . traMADol (ULTRAM) 50 MG tablet Take 1 tablet (50 mg total) by mouth every 8 (eight) hours as needed for pain. 30 tablet 0  . valACYclovir (VALTREX) 1000 MG tablet  Take 1 tablet (1,000 mg total) by mouth daily. 90 tablet 3   No current facility-administered medications on file prior to visit.     Objective:  Objective Physical Exam  Constitutional: She is oriented to person, place, and time. She appears well-developed and well-nourished.  HENT:  Head: Normocephalic and atraumatic.  Eyes: Conjunctivae and EOM are normal.  Neck: Normal range of motion. Neck supple. No JVD present. Carotid bruit is not present. No thyromegaly present.  Cardiovascular: Normal rate, regular rhythm and normal heart sounds.   No murmur heard. Pulmonary/Chest: Effort normal and breath sounds normal. No respiratory distress. She has no wheezes. She has no rales. She exhibits no tenderness.  Musculoskeletal: She exhibits no edema.       Hands: Neurological: She is alert and oriented to person, place, and time.  Psychiatric: She has a normal mood and affect.  Nursing note and vitals reviewed.  BP 120/80 mmHg  Pulse 98  Temp(Src) 99.1 F (37.3 C) (Oral)  Ht 5' (1.524 m)  Wt 132 lb 9.6 oz (60.147 kg)  BMI 25.90 kg/m2  SpO2 94% Wt Readings from Last 3 Encounters:  07/08/15 132 lb 9.6 oz (60.147 kg)  04/26/15 131 lb 9.6 oz (59.693 kg)  04/23/15 130 lb (58.968 kg)     Lab Results  Component Value Date   WBC 7.7 03/28/2015   HGB 14.9 03/28/2015   HCT 45.4 03/28/2015   PLT 227.0 03/28/2015   GLUCOSE 114* 07/08/2015   CHOL 163 07/08/2015   TRIG 143.0 07/08/2015   HDL 45.00 07/08/2015   LDLDIRECT 162.0 03/28/2015   LDLCALC 90 07/08/2015   ALT 21 07/08/2015   AST 19 07/08/2015   NA 139 07/08/2015   K 4.4 07/08/2015   CL 101 07/08/2015   CREATININE 0.57 07/08/2015   BUN 17 07/08/2015   CO2 32 07/08/2015   TSH 81.61* 03/28/2015   HGBA1C 9.3* 07/08/2015   MICROALBUR 1.5 03/28/2015    Dg Hip Unilat With Pelvis 2-3 Views Right  04/26/2015  CLINICAL DATA:  Fall 3 weeks ago. Right hip pain. Initial encounter. EXAM: DG HIP (WITH OR WITHOUT PELVIS) 2-3V RIGHT  COMPARISON:  None. FINDINGS: There is no evidence of hip fracture or dislocation. No acetabular or pelvic fracture identified. There is no evidence of arthropathy or other focal bone abnormality. Lower lumbar spine fusion hardware noted at L5-S1. IMPRESSION: No radiographic abnormality of right hip. Electronically Signed   By: Earle Gell M.D.   On: 04/26/2015 16:14     Assessment & Plan:  Plan I am having Ms. Gott maintain her Insulin Pen Needle, traMADol, methocarbamol, albuterol, tiZANidine, valACYclovir, CELEBREX, FLUoxetine, pantoprazole, HYDROcodone-acetaminophen, alendronate, glimepiride, hydrochlorothiazide, levocetirizine, fluticasone, metroNIDAZOLE, levothyroxine, insulin glargine, and ALPRAZolam.  Meds ordered this encounter  Medications  . insulin glargine (LANTUS) 100 UNIT/ML injection    Sig: 25u SQ q pm    Dispense:  10 mL    Refill:  11  . ALPRAZolam (XANAX) 0.5 MG tablet  Sig: Take 1 tablet (0.5 mg total) by mouth 3 (three) times daily as needed for sleep or anxiety.    Dispense:  30 tablet    Refill:  0    Problem List Items Addressed This Visit      Unprioritized   OSA (obstructive sleep apnea)    Will get home sleep study Pt preferred this to going to sleep specialist       Other Visit Diagnoses    Obstructive sleep apnea    -  Primary    Need for pneumococcal vaccination        Relevant Orders    Pneumococcal conjugate vaccine 13-valent (Completed)    Type II diabetes mellitus with peripheral circulatory disorder, uncontrolled (HCC)        Relevant Medications    insulin glargine (LANTUS) 100 UNIT/ML injection    Other Relevant Orders    Lipid panel (Completed)    Hemoglobin A1c (Completed)    Comprehensive metabolic panel (Completed)    Anxiety        Relevant Medications    ALPRAZolam (XANAX) 0.5 MG tablet    Hyperlipidemia        Relevant Orders    Lipid panel (Completed)    Hemoglobin A1c (Completed)    Comprehensive metabolic panel  (Completed)    Essential hypertension        Relevant Orders    Lipid panel (Completed)    Hemoglobin A1c (Completed)    Comprehensive metabolic panel (Completed)    Trigger finger of both hands        Relevant Orders    Ambulatory referral to Orthopedic Surgery       Follow-up: Return in about 6 months (around 01/07/2016), or if symptoms worsen or fail to improve, for hypertension, hyperlipidemia, diabetes II.  [name], DO

## 2015-07-08 NOTE — Patient Instructions (Addendum)

## 2015-07-08 NOTE — Progress Notes (Signed)
Pre visit review using our clinic review tool, if applicable. No additional management support is needed unless otherwise documented below in the visit note. 

## 2015-07-08 NOTE — Assessment & Plan Note (Signed)
Will get home sleep study Pt preferred this to going to sleep specialist

## 2015-07-09 ENCOUNTER — Encounter: Payer: Self-pay | Admitting: Family Medicine

## 2015-07-09 ENCOUNTER — Other Ambulatory Visit: Payer: Self-pay

## 2015-07-09 DIAGNOSIS — E119 Type 2 diabetes mellitus without complications: Secondary | ICD-10-CM

## 2015-07-09 DIAGNOSIS — Z794 Long term (current) use of insulin: Principal | ICD-10-CM

## 2015-07-09 MED ORDER — INSULIN GLARGINE 100 UNIT/ML SOLOSTAR PEN: PEN_INJECTOR | SUBCUTANEOUS | Status: DC

## 2015-07-11 ENCOUNTER — Other Ambulatory Visit: Payer: Self-pay | Admitting: Family Medicine

## 2015-07-11 MED ORDER — GLUCOSE BLOOD VI STRP: ORAL_STRIP | Status: DC

## 2015-07-11 MED ORDER — ONETOUCH ULTRA 2 W/DEVICE KIT: PACK | Status: DC

## 2015-07-11 NOTE — Addendum Note (Signed)
Addended by: Ewing Schlein on: 07/11/2015 10:19 AM   Modules accepted: Orders

## 2015-07-12 ENCOUNTER — Other Ambulatory Visit: Payer: Self-pay | Admitting: Family Medicine

## 2015-07-12 ENCOUNTER — Telehealth: Payer: Self-pay | Admitting: Family Medicine

## 2015-07-12 DIAGNOSIS — G4730 Sleep apnea, unspecified: Secondary | ICD-10-CM

## 2015-07-12 NOTE — Telephone Encounter (Addendum)
Patient has been made aware and she verbalized understanding. Neuro has called and she will follow up with them next week, she said she would rather have it done at home and wants to know why she has to go to the Neuro office.     KP

## 2015-07-12 NOTE — Telephone Encounter (Signed)
[name] talked to pt and she is interested in home sleep apnea machine when it is available.

## 2015-07-12 NOTE — Telephone Encounter (Signed)
Dr.Lowne advises that the patient will need to see Dr.Dohmeier instead, Dr.Dohmeier does not like the SNAP test and would rather see the patient. The referral was placed by Dr.LOwne,  I left a message for the patient to call me back.   KP

## 2015-07-13 NOTE — Telephone Encounter (Signed)
Home test--- after discussing with neuro is not accurate--- she would just end up having to do one in lab anyway

## 2015-07-15 MED ORDER — LEVOTHYROXINE SODIUM 137 MCG PO TABS: ORAL_TABLET | ORAL | Status: DC

## 2015-07-15 NOTE — Telephone Encounter (Addendum)
Patient aware and verbalized understanding, she has a pending apt with Neuro on the 17th.      KP

## 2015-07-17 ENCOUNTER — Other Ambulatory Visit: Payer: Self-pay

## 2015-07-17 DIAGNOSIS — Z794 Long term (current) use of insulin: Principal | ICD-10-CM

## 2015-07-17 DIAGNOSIS — E119 Type 2 diabetes mellitus without complications: Secondary | ICD-10-CM

## 2015-07-17 MED ORDER — GLIMEPIRIDE 4 MG PO TABS: 4.0000 mg | ORAL_TABLET | Freq: Every day | ORAL | Status: DC

## 2015-07-22 ENCOUNTER — Ambulatory Visit (INDEPENDENT_AMBULATORY_CARE_PROVIDER_SITE_OTHER): Payer: PPO | Admitting: Neurology

## 2015-07-22 ENCOUNTER — Encounter: Payer: Self-pay | Admitting: Neurology

## 2015-07-22 VITALS — BP 102/70 | HR 78 | Resp 20 | Ht 59.5 in | Wt 125.0 lb

## 2015-07-22 DIAGNOSIS — R51 Headache: Secondary | ICD-10-CM

## 2015-07-22 DIAGNOSIS — G4730 Sleep apnea, unspecified: Secondary | ICD-10-CM

## 2015-07-22 DIAGNOSIS — J322 Chronic ethmoidal sinusitis: Secondary | ICD-10-CM | POA: Diagnosis not present

## 2015-07-22 DIAGNOSIS — Z9889 Other specified postprocedural states: Secondary | ICD-10-CM

## 2015-07-22 DIAGNOSIS — F17219 Nicotine dependence, cigarettes, with unspecified nicotine-induced disorders: Secondary | ICD-10-CM | POA: Insufficient documentation

## 2015-07-22 DIAGNOSIS — R0683 Snoring: Secondary | ICD-10-CM | POA: Insufficient documentation

## 2015-07-22 DIAGNOSIS — R519 Headache, unspecified: Secondary | ICD-10-CM

## 2015-07-22 DIAGNOSIS — G4710 Hypersomnia, unspecified: Secondary | ICD-10-CM

## 2015-07-22 MED ORDER — MOMETASONE FUROATE 50 MCG/ACT NA SUSP: 2.0000 | Freq: Every day | NASAL | Status: DC

## 2015-07-22 NOTE — Patient Instructions (Signed)
.  sHypersomnia Hypersomnia is when you feel extremely tired during the day even though you're getting plenty of sleep at night. You may need to take naps during the day, and you may also be extremely difficult to wake up when you are sleeping.  CAUSES  The cause of your hypersomnia may not be known. Hypersomnia may be caused by:   Medicines.  Sleep disorders, such as narcolepsy.  Trauma or injury to your head or nervous system.  Using drugs or alcohol.  Tumors.  Medical conditions, such as depression or hypothyroidism.  Genetics. SIGNS AND SYMPTOMS  The main symptoms of hypersomnia include:   Feeling extremely tired throughout the day.  Being very difficult to wake up.  Sleeping for longer and longer periods.  Taking naps throughout the day. Other symptoms may include:   Feeling:  Restless.  Annoyed.  Anxious.  Low energy.  Having difficulty:  Remembering.  Speaking.  Thinking.  Losing your appetite.  Experiencing hallucinations. DIAGNOSIS  Hypersomnia may be diagnosed by:  Medical history and physical exam. This will include a sleep history.  Completing sleep logs.  Tests may also be done, such as:  Polysomnography.  Multiple sleep latency test (MSLT). TREATMENT  There is no cure for hypersomnia, but treatment can be very effective in helping manage the condition. Treatment may include:  Lifestyle and sleeping strategies to help cope with the condition.  Stimulant medicines.  Treating any underlying causes of hypersomnia. HOME CARE INSTRUCTIONS  Take medicines only as directed by your health care provider.  Schedule short naps for when you feel sleepiest during the day. Tell your employer or teachers that you have hypersomnia. You may be able to adjust your schedule to include time for naps.  Avoid drinking alcohol or caffeinated beverages.  Do not eat a heavy meal before bedtime. Eat at about the same times every day.  Do not drive or  operate heavy machinery if you are sleepy.  Do not swim or go out on the water without a life jacket.  If possible, adjust your schedule so that you do not have to work or be active at night.  Keep all follow-up visits as directed by your health care provider. This is important. SEEK MEDICAL CARE IF:   You have new symptoms.  Your symptoms get worse. SEEK IMMEDIATE MEDICAL CARE IF:  You have serious thoughts of hurting yourself or someone else.   This information is not intended to replace advice given to you by your health care provider. Make sure you discuss any questions you have with your health care provider.   Document Released: 03/13/2002 Document Revised: 04/13/2014 Document Reviewed: 10/26/2013 Elsevier Interactive Patient Education [employer].

## 2015-07-22 NOTE — Progress Notes (Signed)
S[facility]EP MED[facility]INE CLINIC   Provider:  Larey Seat, M D  Referring Provider: Carollee Herter, Alferd Apa, * Primary Care Physician:  [name], DO  Chief Complaint  Patient presents with  . New Patient (Initial Visit)    snoring, had a sleep study over 20 years, rm 68, with husband    HPI:  [name] is a 65 y.o. female , seen here as a referral   from Dr. Garnet Koyanagi for a sleep consultation.   Chief complaint according to patient : according to the patient she was tested for sleep apnea over 20 years ago and was told that her diagnosis was that of sleep apnea but no follow-up was initiated as she recalls. Her husband meanwhile has become more and more aware of her loud snoring and he has started to contract apneas and is very concerned. The patient has a history of hypertension, diabetes, hyperlipidemia. She does not carry a diagnosis of asthma or COPD but uses when necessary albuterol inhaler. , 2 back surgeries to the upper and 2 to the lower spine, hysterectomy, cholecystectomy,  sinus surgery and UPPP- this surgery did not help! . She also has Flonase prescribed. Dr. Etter Sjogren diagnosed further osteo- arthritis and thyroid disease.  Mr. Clermont added that his spouse can sleep in any situation at anytime of the day within a very short period of being at rest or relaxed.the couple has slept in different bedrooms for the last 2 decades, and he states that when his wife goes to bed he can hear within minutes her loud, thunderous snoring. But she is mostly bothered by her daytime sleepiness and fatigue the feeling of not being refreshed or restored after a night's sleep.   Sleep habits are as follows:  And going to bed as early as 9:30 PM she is already asleep within minutes. She does not share her bedroom, which is described as cool, quiet and dark. She reports that she falls asleep in the prone position but she uses only one pillow. She has 1-2 bathroom breaks interrupting her sleep.  She relies on an alarm for the morning when she rises at 5:30 AM. Overall she may get 7 hours of sleep or more. She still is not refreshed or restored in the morning, she reports a very dry mouth, infrequently has headaches. She has not been woken by headaches but the headaches are present when she wakes. She reports vivid dreams, not nightmarish in character.  She drinks coffeine free coffee in AM, no soda and not iced tea.  She is a smoker, 2-3 cigs a day. ETOH: none.      Sleep medical history and family sleep history: She snored all her live, underwent UPPP without regaining control. Was tested for apnea in 1994? No follow up, no CPAP follow.  Had tonsillectomy in childhood.  No slep walking history, no night terrors, no enuresis.  The patient reports that neither her parents was affected by sleep apnea. Her brother neither.   Social history:  The couple sleeps for over 28 years in separate rooms, has 2 grownup daughters , 71 and 67,  her daughter and her husband live next door, with 2 grandchildren.   Review of Systems: Out of a complete 14 system review, the patient complains of only the following symptoms, and all other reviewed systems are negative. No alcohol, no caffeine and 3 cigarettes per day.   Epworth score  11- 18 , depending on the time of day. ,  Fatigue severity score 27  , depression score n/a    Social History   Social History  . Marital Status: Married    Spouse Name: N/A  . Number of Children: N/A  . Years of Education: N/A   Occupational History  . [employer]    Social History Main Topics  . Smoking status: Current Some Day Smoker -- 48 years    Last Attempt to Quit: 12/03/2014  . Smokeless tobacco: Never Used  . Alcohol Use: No  . Drug Use: No  . Sexual Activity:    Partners: Male     Comment: 2 -3 days a week   1 pack lasts 2 weeks   Other Topics Concern  . Not on file   Social History Narrative   Exercise--  3 flts steps a day for 8 hours     Family History  Problem Relation Age of Onset  . Stroke Mother   . Alcohol abuse Mother   . Stroke Maternal Grandmother   . Heart disease Maternal Grandfather   . Breast cancer Cousin     Past Medical History  Diagnosis Date  . Arthritis   . Diabetes mellitus   . Thyroid disease     Past Surgical History  Procedure Laterality Date  . Cholecystectomy    . Abdominal hysterectomy    . Back surgery      x3  . Tonsilectomy, adenoidectomy, bilateral myringotomy and tubes    . Thyroid surgery      Current Outpatient Prescriptions  Medication Sig Dispense Refill  . albuterol (PROAIR HFA) 108 (90 BASE) MCG/ACT inhaler 2 puffs qid prn 1 Inhaler 1  . alendronate (FOSAMAX) 70 MG tablet Take 1 tablet (70 mg total) by mouth every 7 (seven) days. Take with a full glass of water on an empty stomach. 4 tablet 11  . ALPRAZolam (XANAX) 0.5 MG tablet Take 1 tablet (0.5 mg total) by mouth 3 (three) times daily as needed for sleep or anxiety. 30 tablet 0  . Blood Glucose Monitoring Suppl (ONE TOUCH ULTRA 2) w/Device KIT Check blood sugar as directed. Dx:E11.65 1 each 0  . CELEBREX 200 MG capsule TAKE ONE CAPSULE BY MOUTH ONCE DAILY 30 capsule 5  . FLUoxetine (PROZAC) 20 MG capsule TAKE 1 CAPSULE BY MOUTH DAILY 90 capsule 3  . fluticasone (FLONASE) 50 MCG/ACT nasal spray Place 2 sprays into both nostrils daily. 16 g 6  . glimepiride (AMARYL) 4 MG tablet Take 1 tablet (4 mg total) by mouth daily with breakfast. 30 tablet 2  . glucose blood (ONE TOUCH ULTRA TEST) test strip Check blood sugar 4 times per day.E11.65 400 each 2  . hydrochlorothiazide (HYDRODIURIL) 25 MG tablet Take 1 tablet (25 mg total) by mouth daily. 90 tablet 1  . HYDROcodone-acetaminophen (NORCO/VICODIN) 5-325 MG tablet     . Insulin Glargine (LANTUS SOLOSTAR) 100 UNIT/ML Solostar Pen Inject 25 units into the skin at bedtime 15 mL 11  . Insulin Pen Needle (NOVOTWIST) 32G X 5 MM MISC Use daily at Bedtime with Lantus 100 each 0    . levocetirizine (XYZAL) 5 MG tablet Take 1 tablet (5 mg total) by mouth every evening. 30 tablet 5  . levothyroxine (SYNTHROID, LEVOTHROID) 137 MCG tablet TAKE ONE TABLET (137 MCG) BY MOUTH DAILY BEFORE BREAKFAST. 30 tablet 5  . methocarbamol (ROBAXIN) 500 MG tablet     . metroNIDAZOLE (METROGEL VAGINAL) 0.75 % vaginal gel Place 1 Applicatorful vaginally at bedtime. x's 5 days 70  g 0  . pantoprazole (PROTONIX) 40 MG tablet Take 1 tablet (40 mg total) by mouth daily. 30 tablet 11  . tiZANidine (ZANAFLEX) 4 MG tablet Take 4 mg by mouth every 6 (six) hours as needed for muscle spasms.    . traMADol (ULTRAM) 50 MG tablet Take 1 tablet (50 mg total) by mouth every 8 (eight) hours as needed for pain. 30 tablet 0  . valACYclovir (VALTREX) 1000 MG tablet Take 1 tablet (1,000 mg total) by mouth daily. 90 tablet 3   No current facility-administered medications for this visit.    Allergies as of 07/22/2015  . (No Known Allergies)    Vitals: BP 102/70 mmHg  Pulse 78  Resp 20  Ht 4' 11.5" (1.511 m)  Wt 125 lb (56.7 kg)  BMI 24.83 kg/m2 Last Weight:  Wt Readings from Last 1 Encounters:  07/22/15 125 lb (56.7 kg)   SWN:IOEV mass index is 24.83 kg/(m^2).     Last Height:   Ht Readings from Last 1 Encounters:  07/22/15 4' 11.5" (1.511 m)    Physical exam:  General: The patient is awake, alert and appears not in acute distress. The patient is well groomed. Head: Normocephalic, atraumatic. Neck is supple. Mallampati 4 , status post UPPP,  neck circumference: 15 inches . Nasal airflow both nostrils restricted, actually occluded. She has severe sinus disease , pharyngitis  and rhinitis  , TMJ is not  evident . Retrognathia is not seen.  Cardiovascular:  Regular rate and rhythm , without  murmurs or carotid bruit, and without distended neck veins. Respiratory: Lungs are clear to auscultation. Skin:  Without evidence of edema, or rash Trunk: BMI is normal . The patient's posture is erect     Neurologic exam : The patient is awake and alert, oriented to place and time.   Memory subjective described as intact.  Attention span & concentration ability appears normal.  Speech is fluent,  without dysarthria, dysphonia or aphasia.  Mood and affect are appropriate.  Cranial nerves: Pupils are equal and briskly reactive to light. Funduscopic exam without evidence of pallor or edema.  Extraocular movements  in vertical and horizontal planes intact and without nystagmus. Visual fields by finger perimetry are intact. Hearing to finger rub intact.   Facial sensation intact to fine touch.  Facial motor strength is symmetric and tongue and uvula move midline. Shoulder shrug was symmetrical.   Motor exam:  Normal tone, muscle bulk and symmetric strength in all extremities.  Proprioception tested in the upper extremities was normal. Her finger joints lock up. Restricted ROM.  Finger-to-nose maneuver normal without evidence of ataxia, dysmetria or tremor.  Gait and station: Patient walks without assistive device and is able unassisted to climb up to the exam table. Strength within normal limits.  Stance is stable and normal.  Deep tendon reflexes: in the  upper and lower extremities are symmetric and intact. Babinski maneuver response is downgoing.    Dear Dr. Etter Sjogren,   Thank you for sharing this interesting and pleasant patient with me. She will need to undergo an attended sleep study and titration ( if indicated ) as a patient with UPPP.  The patient was advised of the nature of the diagnosed sleep disorder , the treatment options and risks for general a health and wellness arising from not treating the condition.  I spent more than 40 minutes of face to face time with the patient. Greater than 50% of time was spent in counseling and coordination  of care. We have discussed the diagnosis and differential and I answered the patient's questions.     Assessment:  After physical and  neurologic examination, review of laboratory studies,  Personal review of imaging studies, reports of other /same  Imaging studies ,  Results of polysomnography/ neurophysiology testing and pre-existing records as far as provided in visit., my assessment is   1) Mrs. Cuda clearly has a long-standing history of sleep disordered breathing, beginning the thunderous snoring and childhood that has led to her and her husband having different bedrooms for almost 3 decades. After she was diagnosed for the first time with obstructive sleep apnea she seemed not to be keen on CPAP and chose to undergone UPPP instead. The surgery was an aunt pleasant experience and also has left her with a higher risk of aspiration and dysphagia.  2)since her husband has witnessed multiple apneas and for short period of time, I am positive that we will find those in a sleep study. I will order an attended split-night polysomnography for her since she occasionally also has headaches in the morning I would like to add capnography to the diagnostic part of a split-night protocol.  3)she has chronic and frequent sinus disease and rhinitis. She had 2 anterior fusions which also may have changed her upper airway. I will prescribe a nasal spray for her to increase the nasal patency for airflow, but is hoping that she will not be frustrated by using CPAP or developing headaches in response to CPAP.    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 SPLIT night with Co2. Status post UPPP. Patient with headaches, sinus disease. Treat with nasocort  Prior to sleep study and bring to sleep lab.  No sleep aid needed.   Rv after PSG with me !   Asencion Partridge Jaeda Bruso MD  07/22/2015   CC: [name], [address] [place], [place] [address]

## 2015-07-26 DIAGNOSIS — Z1231 Encounter for screening mammogram for malignant neoplasm of breast: Secondary | ICD-10-CM | POA: Diagnosis not present

## 2015-08-05 ENCOUNTER — Telehealth: Payer: Self-pay | Admitting: Family Medicine

## 2015-08-05 DIAGNOSIS — M19041 Primary osteoarthritis, right hand: Secondary | ICD-10-CM | POA: Diagnosis not present

## 2015-08-05 DIAGNOSIS — M65341 Trigger finger, right ring finger: Secondary | ICD-10-CM | POA: Diagnosis not present

## 2015-08-05 DIAGNOSIS — M19042 Primary osteoarthritis, left hand: Secondary | ICD-10-CM | POA: Diagnosis not present

## 2015-08-05 NOTE — Telephone Encounter (Signed)
Do not charge  

## 2015-08-05 NOTE — Telephone Encounter (Signed)
Pt's spouse Vicente Serene called in because they received a bill in regards to an appt on 02/25/15.  Pt says that they called in to cancel appt ahead of time and shouldn't have been charged.   Please assist further.      CB: [phone]

## 2015-08-07 NOTE — Telephone Encounter (Signed)
Ebony please reverse this one

## 2015-08-09 ENCOUNTER — Ambulatory Visit (INDEPENDENT_AMBULATORY_CARE_PROVIDER_SITE_OTHER): Payer: PPO | Admitting: Neurology

## 2015-08-09 DIAGNOSIS — R51 Headache: Secondary | ICD-10-CM

## 2015-08-09 DIAGNOSIS — Z9889 Other specified postprocedural states: Secondary | ICD-10-CM

## 2015-08-09 DIAGNOSIS — J322 Chronic ethmoidal sinusitis: Secondary | ICD-10-CM

## 2015-08-09 DIAGNOSIS — G4730 Sleep apnea, unspecified: Secondary | ICD-10-CM

## 2015-08-09 DIAGNOSIS — R519 Headache, unspecified: Secondary | ICD-10-CM

## 2015-08-09 DIAGNOSIS — G4710 Hypersomnia, unspecified: Secondary | ICD-10-CM

## 2015-08-09 DIAGNOSIS — R0683 Snoring: Secondary | ICD-10-CM

## 2015-08-22 ENCOUNTER — Telehealth: Payer: Self-pay

## 2015-08-22 NOTE — Telephone Encounter (Signed)
[name]                CID [id number]  Patient SAME                 Pt's Dr UNSURE       Area Code 336 Phone# [phone] * [phone]     RE MISSED PHONE CALL, NOT SURE WHO CALLED                                                                 Disp:Y/N N If Y = C/B If No Response In 24mnutes ============================================================

## 2015-08-22 NOTE — Telephone Encounter (Signed)
I spoke to pt regarding her sleep study results. I advised her that her study revealed such mild osa that treatment is not needed. Dr. Brett Fairy recommends proceeding with snoring therapy whic may include an oral appliance or ENT procedure. Pt declined snoring therapy. I advised her that weight loss and positional therapy may be entertained. I advised pt to avoid caffeine containing beverages and chocolate. Pt verbalized understanding. Pt declined a follow up appt with Dr. Brett Fairy. Pt verbalized understanding of results. Pt had no questions at this time but was encouraged to call back if questions arise.

## 2015-08-22 NOTE — Telephone Encounter (Signed)
Pt called to get results.

## 2015-08-22 NOTE — Telephone Encounter (Signed)
I called pt to discuss sleep study results. No answer, left a message asking her to call me back. If pt returns my call on Friday, please advise her that I will call her back on Monday when I am in the office.

## 2015-08-30 NOTE — Telephone Encounter (Signed)
Pt called back in to check the status of billing. Pt says that he received another bill.    Informed pt that I have spoken to my Team Lead in regards to charge and she states that she will contact billing to take care of charge.     Pt expressed understanding. He says great. He is grateful for assistance.    Thanks.

## 2015-09-06 DIAGNOSIS — M65312 Trigger thumb, left thumb: Secondary | ICD-10-CM | POA: Diagnosis not present

## 2015-09-06 DIAGNOSIS — M19041 Primary osteoarthritis, right hand: Secondary | ICD-10-CM | POA: Diagnosis not present

## 2015-09-06 DIAGNOSIS — M19042 Primary osteoarthritis, left hand: Secondary | ICD-10-CM | POA: Diagnosis not present

## 2015-09-06 DIAGNOSIS — M65341 Trigger finger, right ring finger: Secondary | ICD-10-CM | POA: Diagnosis not present

## 2015-10-01 ENCOUNTER — Other Ambulatory Visit: Payer: Self-pay | Admitting: Family Medicine

## 2015-10-01 NOTE — Telephone Encounter (Signed)
Last seen and filled 07/08/15 #30  Please advise    KP

## 2015-10-10 DIAGNOSIS — E1165 Type 2 diabetes mellitus with hyperglycemia: Secondary | ICD-10-CM | POA: Diagnosis not present

## 2015-10-10 DIAGNOSIS — Z720 Tobacco use: Secondary | ICD-10-CM | POA: Diagnosis not present

## 2015-10-10 DIAGNOSIS — Z794 Long term (current) use of insulin: Secondary | ICD-10-CM | POA: Diagnosis not present

## 2015-10-29 ENCOUNTER — Other Ambulatory Visit: Payer: Self-pay | Admitting: Family Medicine

## 2015-10-29 DIAGNOSIS — E119 Type 2 diabetes mellitus without complications: Secondary | ICD-10-CM

## 2015-10-29 DIAGNOSIS — Z794 Long term (current) use of insulin: Principal | ICD-10-CM

## 2015-10-30 MED ORDER — GLIMEPIRIDE 4 MG PO TABS: 4.0000 mg | ORAL_TABLET | Freq: Every day | ORAL | 2 refills | Status: DC

## 2015-11-15 ENCOUNTER — Other Ambulatory Visit: Payer: Self-pay

## 2015-11-15 MED ORDER — ALPRAZOLAM 0.5 MG PO TABS: ORAL_TABLET | ORAL | 0 refills | Status: DC

## 2015-11-15 NOTE — Telephone Encounter (Signed)
Last seen 07/08/15 and filled 10/01/15 #30  Please advise    KP

## 2015-11-18 ENCOUNTER — Ambulatory Visit (INDEPENDENT_AMBULATORY_CARE_PROVIDER_SITE_OTHER): Payer: PPO | Admitting: Medical

## 2015-11-18 ENCOUNTER — Encounter: Payer: Self-pay | Admitting: Medical

## 2015-11-18 VITALS — BP 100/60 | HR 86 | Temp 98.5°F | Ht 59.5 in | Wt 126.4 lb

## 2015-11-18 DIAGNOSIS — J069 Acute upper respiratory infection, unspecified: Secondary | ICD-10-CM

## 2015-11-18 DIAGNOSIS — H60392 Other infective otitis externa, left ear: Secondary | ICD-10-CM

## 2015-11-18 DIAGNOSIS — J0100 Acute maxillary sinusitis, unspecified: Secondary | ICD-10-CM

## 2015-11-18 DIAGNOSIS — R05 Cough: Secondary | ICD-10-CM | POA: Diagnosis not present

## 2015-11-18 DIAGNOSIS — R059 Cough, unspecified: Secondary | ICD-10-CM

## 2015-11-18 MED ORDER — AMOXICILLIN-POT CLAVULANATE 875-125 MG PO TABS: 1.0000 | ORAL_TABLET | Freq: Two times a day (BID) | ORAL | 0 refills | Status: DC

## 2015-11-18 MED ORDER — FLUTICASONE PROPIONATE 50 MCG/ACT NA SUSP: 2.0000 | Freq: Every day | NASAL | 6 refills | Status: DC

## 2015-11-18 MED ORDER — HYDROCODONE-HOMATROPINE 5-1.5 MG/5ML PO SYRP: 5.0000 mL | ORAL_SOLUTION | Freq: Three times a day (TID) | ORAL | 0 refills | Status: DC | PRN

## 2015-11-18 NOTE — Patient Instructions (Addendum)
Your appear to have a sinus infection. I am prescribing  augmentin antibiotic for the infection. To help with the nasal congestion I prescribed flonase nasal steroid. For your associated cough, I prescribed hycodan cough medicine.  Rest, hydrate, tylenol for fever.    Follow up in 7 days or as needed.

## 2015-11-18 NOTE — Progress Notes (Signed)
Subjective:    Patient ID: [name], female    DOB: 29-Nov-1950, 65 y.o.   MRN: [id number]  HPI  Pt in sick for since Friday. Grandaughter had strep. Pt has some st but also some sinus pressure and ear pain. Faint teeth pain. Some mucous when blows nose. Some fever, chills and mild sweats.    Review of Systems  Constitutional: Positive for chills, fatigue and fever.  HENT: Positive for congestion, ear pain, sinus pressure and sore throat.   Respiratory: Negative for cough, chest tightness, shortness of breath and wheezing.   Cardiovascular: Negative for chest pain and palpitations.  Gastrointestinal: Negative for abdominal pain.  Musculoskeletal: Negative for back pain, myalgias and neck pain.  Neurological: Negative for dizziness, weakness, light-headedness and numbness.  Hematological: Negative for adenopathy. Does not bruise/bleed easily.  Psychiatric/Behavioral: Negative for behavioral problems and confusion.    Past Medical History:  Diagnosis Date  . Arthritis   . Diabetes mellitus   . Thyroid disease      Social History   Social History  . Marital status: Married    Spouse name: N/A  . Number of children: N/A  . Years of education: N/A   Occupational History  . [employer]   Social History Main Topics  . Smoking status: Current Some Day Smoker    Years: 48.00    Last attempt to quit: 12/03/2014  . Smokeless tobacco: Never Used  . Alcohol use No  . Drug use: No  . Sexual activity: Yes    Partners: Male     Comment: 2 -3 days a week   1 pack lasts 2 weeks   Other Topics Concern  . Not on file   Social History Narrative   Exercise--  3 flts steps a day for 8 hours    Past Surgical History:  Procedure Laterality Date  . ABDOMINAL HYSTERECTOMY    . BACK SURGERY     x3  . CHOLECYSTECTOMY    . THYROID SURGERY    . TONSILECTOMY, ADENOIDECTOMY, BILATERAL MYRINGOTOMY AND TUBES      Family History  Problem Relation Age of  Onset  . Stroke Mother   . Alcohol abuse Mother   . Stroke Maternal Grandmother   . Heart disease Maternal Grandfather   . Breast cancer Cousin     No Known Allergies  Current Outpatient Prescriptions on File Prior to Visit  Medication Sig Dispense Refill  . albuterol (PROAIR HFA) 108 (90 BASE) MCG/ACT inhaler 2 puffs qid prn 1 Inhaler 1  . alendronate (FOSAMAX) 70 MG tablet Take 1 tablet (70 mg total) by mouth every 7 (seven) days. Take with a full glass of water on an empty stomach. 4 tablet 11  . ALPRAZolam (XANAX) 0.5 MG tablet TAKE ONE TABLET BY MOUTH THREE TIMES A DAY AS NEEDED FOR SLEEP OR ANXIETY 30 tablet 0  . Blood Glucose Monitoring Suppl (ONE TOUCH ULTRA 2) w/Device KIT Check blood sugar as directed. Dx:E11.65 1 each 0  . CELEBREX 200 MG capsule TAKE ONE CAPSULE BY MOUTH ONCE DAILY 30 capsule 5  . FLUoxetine (PROZAC) 20 MG capsule TAKE 1 CAPSULE BY MOUTH DAILY 90 capsule 3  . fluticasone (FLONASE) 50 MCG/ACT nasal spray Place 2 sprays into both nostrils daily. 16 g 6  . glimepiride (AMARYL) 4 MG tablet Take 1 tablet (4 mg total) by mouth daily with breakfast. 30 tablet 2  . glucose blood (ONE TOUCH ULTRA TEST) test strip Check  blood sugar 4 times per day.E11.65 400 each 2  . hydrochlorothiazide (HYDRODIURIL) 25 MG tablet Take 1 tablet (25 mg total) by mouth daily. 90 tablet 1  . HYDROcodone-acetaminophen (NORCO/VICODIN) 5-325 MG tablet     . Insulin Glargine (LANTUS SOLOSTAR) 100 UNIT/ML Solostar Pen Inject 25 units into the skin at bedtime 15 mL 11  . Insulin Pen Needle (NOVOTWIST) 32G X 5 MM MISC Use daily at Bedtime with Lantus 100 each 0  . levocetirizine (XYZAL) 5 MG tablet Take 1 tablet (5 mg total) by mouth every evening. 30 tablet 5  . levothyroxine (SYNTHROID, LEVOTHROID) 137 MCG tablet TAKE ONE TABLET (137 MCG) BY MOUTH DAILY BEFORE BREAKFAST. 30 tablet 5  . methocarbamol (ROBAXIN) 500 MG tablet     . metroNIDAZOLE (METROGEL VAGINAL) 0.75 % vaginal gel Place 1  Applicatorful vaginally at bedtime. x's 5 days 70 g 0  . mometasone (NASONEX) 50 MCG/ACT nasal spray Place 2 sprays into the nose daily. 17 g 12  . pantoprazole (PROTONIX) 40 MG tablet Take 1 tablet (40 mg total) by mouth daily. 30 tablet 11  . tiZANidine (ZANAFLEX) 4 MG tablet Take 4 mg by mouth every 6 (six) hours as needed for muscle spasms.    . traMADol (ULTRAM) 50 MG tablet Take 1 tablet (50 mg total) by mouth every 8 (eight) hours as needed for pain. 30 tablet 0  . valACYclovir (VALTREX) 1000 MG tablet Take 1 tablet (1,000 mg total) by mouth daily. 90 tablet 3   No current facility-administered medications on file prior to visit.     BP 100/60 (BP Location: Left Arm, Patient Position: Sitting, Cuff Size: Normal)   Pulse 86   Temp 98.5 F (36.9 C) (Oral)   Ht 4' 11.5" (1.511 m)   Wt 126 lb 6.4 oz (57.3 kg)   SpO2 97%   BMI 25.10 kg/m      Objective:   Physical Exam  General  Mental Status - Alert. General Appearance - Well groomed. Not in acute distress.  Skin Rashes- No Rashes.  HEENT Head- Normal. Ear Auditory Canal - Left- tm mild red. Right - Normal.Tympanic Membrane- Left- moderate bright red. Right- Normal. Eye Sclera/Conjunctiva- Left- Normal. Right- Normal. Nose & Sinuses Nasal Mucosa- Left-  Boggy and Congested. Right-  Boggy and  Congested.Bilateral maxillary and frontal sinus pressure. Mouth & Throat Lips: Upper Lip- Normal: no dryness, cracking, pallor, cyanosis, or vesicular eruption. Lower Lip-Normal: no dryness, cracking, pallor, cyanosis or vesicular eruption. Buccal Mucosa- Bilateral- No Aphthous ulcers. Oropharynx- No Discharge or Erythema. Tonsils: Characteristics- Bilateral- post surgical changes but mild bright red. Size/Enlargement- Bilateral- No enlargement. Discharge- bilateral-None.  Neck Neck- Supple. No Masses.   Chest and Lung Exam Auscultation: Breath Sounds:-Clear even and unlabored.  Cardiovascular Auscultation:Rythm- Regular,  rate and rhythm. Murmurs & Other Heart Sounds:Ausculatation of the heart reveal- No Murmurs.  Lymphatic Head & Neck General Head & Neck Lymphatics: Bilateral: Description- No Localized lymphadenopathy.       Assessment & Plan:  Your appear to have a sinus infection. I am prescribing  augmentin antibiotic for the infection. To help with the nasal congestion I prescribed flonase nasal steroid. For your associated cough, I prescribed hycodan cough medicine.  Note lt OM as well. Augmentin written for that as well.  Rest, hydrate, tylenol for fever.  Follow up in 7 days or as needed.  Teandre Hamre, Percell Miller, PA-C

## 2015-11-18 NOTE — Progress Notes (Signed)
Pre visit review using our clinic review tool, if applicable. No additional management support is needed unless otherwise documented below in the visit note./HSM  

## 2015-12-11 DIAGNOSIS — B373 Candidiasis of vulva and vagina: Secondary | ICD-10-CM | POA: Diagnosis not present

## 2015-12-27 ENCOUNTER — Other Ambulatory Visit: Payer: Self-pay | Admitting: Family Medicine

## 2015-12-30 DIAGNOSIS — M65341 Trigger finger, right ring finger: Secondary | ICD-10-CM | POA: Diagnosis not present

## 2015-12-30 DIAGNOSIS — M65312 Trigger thumb, left thumb: Secondary | ICD-10-CM | POA: Diagnosis not present

## 2015-12-30 DIAGNOSIS — M19041 Primary osteoarthritis, right hand: Secondary | ICD-10-CM | POA: Diagnosis not present

## 2015-12-30 DIAGNOSIS — M19042 Primary osteoarthritis, left hand: Secondary | ICD-10-CM | POA: Diagnosis not present

## 2016-01-10 ENCOUNTER — Ambulatory Visit: Payer: PPO | Admitting: Family Medicine

## 2016-01-13 DIAGNOSIS — H5203 Hypermetropia, bilateral: Secondary | ICD-10-CM | POA: Diagnosis not present

## 2016-01-13 DIAGNOSIS — H524 Presbyopia: Secondary | ICD-10-CM | POA: Diagnosis not present

## 2016-01-13 DIAGNOSIS — H52223 Regular astigmatism, bilateral: Secondary | ICD-10-CM | POA: Diagnosis not present

## 2016-01-13 DIAGNOSIS — H2513 Age-related nuclear cataract, bilateral: Secondary | ICD-10-CM | POA: Diagnosis not present

## 2016-02-04 ENCOUNTER — Other Ambulatory Visit: Payer: Self-pay | Admitting: Family Medicine

## 2016-02-04 NOTE — Telephone Encounter (Signed)
Pt has been scheduled on 02/07/16 for follow up with Dr. Etter Sjogren. I have refilled Rx for levethyroxine #30 tablets 0 refills until patient is seen and evaluated. TL/CMA

## 2016-02-05 ENCOUNTER — Other Ambulatory Visit: Payer: Self-pay | Admitting: Orthopedic Surgery

## 2016-02-05 DIAGNOSIS — M65341 Trigger finger, right ring finger: Secondary | ICD-10-CM | POA: Diagnosis not present

## 2016-02-05 DIAGNOSIS — D492 Neoplasm of unspecified behavior of bone, soft tissue, and skin: Secondary | ICD-10-CM | POA: Diagnosis not present

## 2016-02-05 DIAGNOSIS — M720 Palmar fascial fibromatosis [Dupuytren]: Secondary | ICD-10-CM | POA: Diagnosis not present

## 2016-02-07 ENCOUNTER — Ambulatory Visit (INDEPENDENT_AMBULATORY_CARE_PROVIDER_SITE_OTHER): Payer: PPO | Admitting: Family Medicine

## 2016-02-07 ENCOUNTER — Encounter: Payer: Self-pay | Admitting: Family Medicine

## 2016-02-07 VITALS — BP 100/62 | HR 90 | Temp 98.3°F | Resp 16 | Ht 60.0 in | Wt 127.6 lb

## 2016-02-07 DIAGNOSIS — E1165 Type 2 diabetes mellitus with hyperglycemia: Secondary | ICD-10-CM

## 2016-02-07 DIAGNOSIS — E1151 Type 2 diabetes mellitus with diabetic peripheral angiopathy without gangrene: Secondary | ICD-10-CM | POA: Diagnosis not present

## 2016-02-07 DIAGNOSIS — Z0000 Encounter for general adult medical examination without abnormal findings: Secondary | ICD-10-CM

## 2016-02-07 DIAGNOSIS — I10 Essential (primary) hypertension: Secondary | ICD-10-CM | POA: Diagnosis not present

## 2016-02-07 DIAGNOSIS — E039 Hypothyroidism, unspecified: Secondary | ICD-10-CM | POA: Diagnosis not present

## 2016-02-07 DIAGNOSIS — IMO0002 Reserved for concepts with insufficient information to code with codable children: Secondary | ICD-10-CM

## 2016-02-07 DIAGNOSIS — Z794 Long term (current) use of insulin: Secondary | ICD-10-CM

## 2016-02-07 DIAGNOSIS — J441 Chronic obstructive pulmonary disease with (acute) exacerbation: Secondary | ICD-10-CM | POA: Diagnosis not present

## 2016-02-07 DIAGNOSIS — E119 Type 2 diabetes mellitus without complications: Secondary | ICD-10-CM

## 2016-02-07 LAB — COMPREHENSIVE METABOLIC PANEL
ALBUMIN: 4.1 g/dL (ref 3.5–5.2)
ALT: 15 U/L (ref 0–35)
AST: 16 U/L (ref 0–37)
Alkaline Phosphatase: 94 U/L (ref 39–117)
BUN: 15 mg/dL (ref 6–23)
CALCIUM: 10.1 mg/dL (ref 8.4–10.5)
CHLORIDE: 100 meq/L (ref 96–112)
CO2: 31 meq/L (ref 19–32)
Creatinine, Ser: 0.62 mg/dL (ref 0.40–1.20)
GFR: 102.49 mL/min (ref >60.00)
Glucose, Bld: 186 mg/dL — ABNORMAL HIGH (ref 70–99)
POTASSIUM: 4.4 meq/L (ref 3.5–5.1)
Sodium: 138 mEq/L (ref 135–145)
Total Bilirubin: 0.5 mg/dL (ref 0.2–1.2)
Total Protein: 7.8 g/dL (ref 6.0–8.3)

## 2016-02-07 LAB — POCT URINALYSIS DIPSTICK
BILIRUBIN UA: NEGATIVE
Blood, UA: NEGATIVE
GLUCOSE UA: NEGATIVE
KETONES UA: NEGATIVE
Leukocytes, UA: NEGATIVE
Nitrite, UA: NEGATIVE
Protein, UA: NEGATIVE
UROBILINOGEN UA: 0.2
pH, UA: 5.5

## 2016-02-07 LAB — LIPID PANEL
CHOL/HDL RATIO: 4
CHOLESTEROL: 216 mg/dL — AB (ref 0–200)
HDL: 50 mg/dL (ref >39.00)
LDL CALC: 135 mg/dL — AB (ref 0–99)
NonHDL: 165.94
TRIGLYCERIDES: 154 mg/dL — AB (ref 0.0–149.0)
VLDL: 30.8 mg/dL (ref 0.0–40.0)

## 2016-02-07 LAB — TSH: TSH: 0.75 u[IU]/mL (ref 0.35–4.50)

## 2016-02-07 MED ORDER — ALBUTEROL SULFATE HFA 108 (90 BASE) MCG/ACT IN AERS: INHALATION_SPRAY | RESPIRATORY_TRACT | 1 refills | Status: DC

## 2016-02-07 MED ORDER — FLUTICASONE-SALMETEROL 250-50 MCG/DOSE IN AEPB: 1.0000 | INHALATION_SPRAY | Freq: Two times a day (BID) | RESPIRATORY_TRACT | 3 refills | Status: DC

## 2016-02-07 MED ORDER — HYDROCHLOROTHIAZIDE 25 MG PO TABS: 25.0000 mg | ORAL_TABLET | Freq: Every day | ORAL | 1 refills | Status: DC

## 2016-02-07 MED ORDER — LEVOTHYROXINE SODIUM 137 MCG PO TABS: ORAL_TABLET | ORAL | 0 refills | Status: DC

## 2016-02-07 MED ORDER — GLIMEPIRIDE 4 MG PO TABS: 4.0000 mg | ORAL_TABLET | Freq: Every day | ORAL | 2 refills | Status: DC

## 2016-02-07 NOTE — Assessment & Plan Note (Signed)
Pt agreed to colon in office but when GI called her she refused

## 2016-02-07 NOTE — Patient Instructions (Signed)

## 2016-02-07 NOTE — Progress Notes (Signed)
0         Patient ID: [name], female    DOB: 06-10-1950  Age: 65 y.o. MRN: [id number]    Subjective:  Subjective  HPI [name] presents for f/u dm, bp , thyroid .cholesterol and copd.  HPI HYPERTENSION   Blood pressure range-not checking   Chest pain- no      Dyspnea- no Lightheadedness- no Edema- no  Other side effects - no   Medication compliance: good Low salt diet- yes    DIABETES    Blood Sugar ranges-129- 202  Polyuria- no New Visual problems- no  Hypoglycemic symptoms- no  Other side effects-no Medication compliance - good Last eye exam- last week Foot exam- today   HYPERLIPIDEMIA  Medication compliance- good RUQ pain- no  Muscle aches- no Other side effects-no   Review of Systems  Constitutional: Negative for appetite change, diaphoresis, fatigue and unexpected weight change.  Eyes: Negative for pain, redness and visual disturbance.  Respiratory: Negative for cough, chest tightness, shortness of breath and wheezing.   Cardiovascular: Negative for chest pain, palpitations and leg swelling.  Endocrine: Negative for cold intolerance, heat intolerance, polydipsia, polyphagia and polyuria.  Genitourinary: Negative for difficulty urinating, dysuria and frequency.  Neurological: Negative for dizziness, light-headedness, numbness and headaches.    History Past Medical History:  Diagnosis Date  . Arthritis   . Diabetes mellitus   . Thyroid disease     She has a past surgical history that includes Cholecystectomy; Abdominal hysterectomy; Back surgery; Tonsilectomy, adenoidectomy, bilateral myringotomy and tubes; and Thyroid surgery.   Her family history includes Alcohol abuse in her mother; Breast cancer in her cousin and daughter; Heart disease in her maternal grandfather; Stroke in her maternal grandmother and mother.She reports that she has been smoking.  She has smoked for the past 48.00 years. She has never used smokeless tobacco. She  reports that she does not drink alcohol or use drugs.  Current Outpatient Prescriptions on File Prior to Visit  Medication Sig Dispense Refill  . alendronate (FOSAMAX) 70 MG tablet Take 1 tablet (70 mg total) by mouth every 7 (seven) days. Take with a full glass of water on an empty stomach. 4 tablet 11  . ALPRAZolam (XANAX) 0.5 MG tablet TAKE ONE TABLET BY MOUTH THREE TIMES A DAY AS NEEDED FOR SLEEP OR ANXIETY 30 tablet 0  . amoxicillin-clavulanate (AUGMENTIN) 875-125 MG tablet Take 1 tablet by mouth 2 (two) times daily. 20 tablet 0  . Blood Glucose Monitoring Suppl (ONE TOUCH ULTRA 2) w/Device KIT Check blood sugar as directed. Dx:E11.65 1 each 0  . CELEBREX 200 MG capsule TAKE ONE CAPSULE BY MOUTH ONCE DAILY 30 capsule 5  . FLUoxetine (PROZAC) 20 MG capsule TAKE 1 CAPSULE BY MOUTH DAILY 90 capsule 3  . fluticasone (FLONASE) 50 MCG/ACT nasal spray Place 2 sprays into both nostrils daily. 16 g 6  . glucose blood (ONE TOUCH ULTRA TEST) test strip Check blood sugar 4 times per day.E11.65 400 each 2  . HYDROcodone-acetaminophen (NORCO/VICODIN) 5-325 MG tablet     . HYDROcodone-homatropine (HYCODAN) 5-1.5 MG/5ML syrup Take 5 mLs by mouth every 8 (eight) hours as needed for cough. 120 mL 0  . Insulin Glargine (LANTUS SOLOSTAR) 100 UNIT/ML Solostar Pen Inject 25 units into the skin at bedtime 15 mL 11  . Insulin Pen Needle (NOVOTWIST) 32G X 5 MM MISC Use daily at Bedtime with Lantus 100 each 0  . levocetirizine (XYZAL) 5 MG tablet Take 1 tablet (  5 mg total) by mouth every evening. 30 tablet 5  . methocarbamol (ROBAXIN) 500 MG tablet     . metroNIDAZOLE (METROGEL VAGINAL) 0.75 % vaginal gel Place 1 Applicatorful vaginally at bedtime. x's 5 days 70 g 0  . mometasone (NASONEX) 50 MCG/ACT nasal spray Place 2 sprays into the nose daily. 17 g 12  . pantoprazole (PROTONIX) 40 MG tablet Take 1 tablet (40 mg total) by mouth daily. 30 tablet 11  . tiZANidine (ZANAFLEX) 4 MG tablet Take 4 mg by mouth every 6  (six) hours as needed for muscle spasms.    . traMADol (ULTRAM) 50 MG tablet Take 1 tablet (50 mg total) by mouth every 8 (eight) hours as needed for pain. 30 tablet 0  . valACYclovir (VALTREX) 1000 MG tablet Take 1 tablet (1,000 mg total) by mouth daily. 90 tablet 3   No current facility-administered medications on file prior to visit.      Objective:  Objective  Physical Exam  Constitutional: She is oriented to person, place, and time. She appears well-developed and well-nourished.  HENT:  Head: Normocephalic and atraumatic.  Eyes: Conjunctivae and EOM are normal.  Neck: Normal range of motion. Neck supple. No JVD present. Carotid bruit is not present. No thyromegaly present.  Cardiovascular: Normal rate, regular rhythm and normal heart sounds.   No murmur heard. Pulmonary/Chest: Effort normal and breath sounds normal. No respiratory distress. She has no wheezes. She has no rales. She exhibits no tenderness.  Musculoskeletal: She exhibits no edema.       Hands: Neurological: She is alert and oriented to person, place, and time.  Psychiatric: She has a normal mood and affect.  Sensory exam of the foot is normal, tested with the monofilament. Good pulses, no lesions or ulcers, good peripheral pulses.  BP 100/62 (BP Location: Left Arm, Patient Position: Sitting, Cuff Size: Normal)   Pulse 90   Temp 98.3 F (36.8 C)   Resp 16   Ht 5' (1.524 m)   Wt 127 lb 9.6 oz (57.9 kg)   SpO2 95%   BMI 24.92 kg/m  Wt Readings from Last 3 Encounters:  02/07/16 127 lb 9.6 oz (57.9 kg)  11/18/15 126 lb 6.4 oz (57.3 kg)  07/22/15 125 lb (56.7 kg)     Lab Results  Component Value Date   WBC 7.7 03/28/2015   HGB 14.9 03/28/2015   HCT 45.4 03/28/2015   PLT 227.0 03/28/2015   GLUCOSE 114 (H) 07/08/2015   CHOL 163 07/08/2015   TRIG 143.0 07/08/2015   HDL 45.00 07/08/2015   LDLDIRECT 162.0 03/28/2015   LDLCALC 90 07/08/2015   ALT 21 07/08/2015   AST 19 07/08/2015   NA 139 07/08/2015   K  4.4 07/08/2015   CL 101 07/08/2015   CREATININE 0.57 07/08/2015   BUN 17 07/08/2015   CO2 32 07/08/2015   TSH 81.61 (H) 03/28/2015   HGBA1C 9.3 (H) 07/08/2015   MICROALBUR 1.5 03/28/2015    Dg Hip Unilat With Pelvis 2-3 Views Right  Result Date: 04/26/2015 CLINICAL DATA:  Fall 3 weeks ago. Right hip pain. Initial encounter. EXAM: DG HIP (WITH OR WITHOUT PELVIS) 2-3V RIGHT COMPARISON:  None. FINDINGS: There is no evidence of hip fracture or dislocation. No acetabular or pelvic fracture identified. There is no evidence of arthropathy or other focal bone abnormality. Lower lumbar spine fusion hardware noted at L5-S1. IMPRESSION: No radiographic abnormality of right hip. Electronically Signed   By: Sharrie Rothman.D.  On: 04/26/2015 16:14     Assessment & Plan:  Plan  I have changed Ms. Heminger's hydrochlorothiazide. I am also having her start on Fluticasone-Salmeterol. Additionally, I am having her maintain her Insulin Pen Needle, traMADol, methocarbamol, tiZANidine, valACYclovir, CELEBREX, FLUoxetine, pantoprazole, HYDROcodone-acetaminophen, alendronate, levocetirizine, metroNIDAZOLE, Insulin Glargine, glucose blood, ONE TOUCH ULTRA 2, mometasone, ALPRAZolam, amoxicillin-clavulanate, HYDROcodone-homatropine, fluticasone, albuterol, levothyroxine, and glimepiride.  Meds ordered this encounter  Medications  . albuterol (PROAIR HFA) 108 (90 Base) MCG/ACT inhaler    Sig: 2 puffs qid prn    Dispense:  1 Inhaler    Refill:  1  . Fluticasone-Salmeterol (ADVAIR DISKUS) 250-50 MCG/DOSE AEPB    Sig: Inhale 1 puff into the lungs 2 (two) times daily.    Dispense:  1 each    Refill:  3  . levothyroxine (SYNTHROID, LEVOTHROID) 137 MCG tablet    Sig: TAKE ONE TABLET (137 MCG) BY MOUTH DAILY BEFORE BREAKFAST.    Dispense:  30 tablet    Refill:  0  . hydrochlorothiazide (HYDRODIURIL) 25 MG tablet    Sig: Take 1 tablet (25 mg total) by mouth daily.    Dispense:  90 tablet    Refill:  1  . glimepiride  (AMARYL) 4 MG tablet    Sig: Take 1 tablet (4 mg total) by mouth daily with breakfast.    Dispense:  30 tablet    Refill:  2    This is a new prescription based on recent labs.    Problem List Items Addressed This Visit      Unprioritized   COPD exacerbation ([place])   Relevant Medications   albuterol (PROAIR HFA) 108 (90 Base) MCG/ACT inhaler   Fluticasone-Salmeterol (ADVAIR DISKUS) 250-50 MCG/DOSE AEPB    Other Visit Diagnoses    Hypothyroidism, unspecified type    -  Primary   Relevant Medications   levothyroxine (SYNTHROID, LEVOTHROID) 137 MCG tablet   Other Relevant Orders   TSH   DM (diabetes mellitus) type II uncontrolled, periph vascular disorder (HCC)       Relevant Medications   hydrochlorothiazide (HYDRODIURIL) 25 MG tablet   glimepiride (AMARYL) 4 MG tablet   Other Relevant Orders   Comprehensive metabolic panel   Lipid panel   POCT urinalysis dipstick (Completed)   Essential hypertension       Relevant Medications   hydrochlorothiazide (HYDRODIURIL) 25 MG tablet   Other Relevant Orders   Comprehensive metabolic panel   Lipid panel   POCT urinalysis dipstick (Completed)   TSH   Preventative health care       Relevant Orders   Ambulatory referral to Gastroenterology   Type 2 diabetes mellitus without complication, with long-term current use of insulin (HCC)       Relevant Medications   glimepiride (AMARYL) 4 MG tablet      Follow-up: Return in about 6 months (around 08/06/2016) for annual exam, fasting---- welcome to medicare.  [name], DO

## 2016-02-07 NOTE — Progress Notes (Signed)
Pre visit review using our clinic review tool, if applicable. No additional management support is needed unless otherwise documented below in the visit note. 

## 2016-02-12 ENCOUNTER — Other Ambulatory Visit: Payer: Self-pay | Admitting: Family Medicine

## 2016-02-12 DIAGNOSIS — E785 Hyperlipidemia, unspecified: Secondary | ICD-10-CM

## 2016-02-26 ENCOUNTER — Telehealth: Payer: Self-pay | Admitting: *Deleted

## 2016-02-26 MED ORDER — ALPRAZOLAM 0.5 MG PO TABS: ORAL_TABLET | ORAL | 0 refills | Status: DC

## 2016-02-26 NOTE — Telephone Encounter (Signed)
I saw pt xanax refill request. It looks like she has used in past before. But no recent rx of xanax. So I signed off on the xanax rx.

## 2016-02-26 NOTE — Telephone Encounter (Signed)
Received fax from [facility] for alprazolam 0.'5mg'$ .  Last OV: 02/07/16 Next OV: 02/08/17 Last RF: 11/15/15, #30 UDS: none on file Rx printed and forwarded to covering PRovider for signature.

## 2016-03-02 ENCOUNTER — Other Ambulatory Visit: Payer: Self-pay | Admitting: Family Medicine

## 2016-03-08 ENCOUNTER — Other Ambulatory Visit: Payer: Self-pay | Admitting: Family Medicine

## 2016-03-08 DIAGNOSIS — Z794 Long term (current) use of insulin: Principal | ICD-10-CM

## 2016-03-08 DIAGNOSIS — E119 Type 2 diabetes mellitus without complications: Secondary | ICD-10-CM

## 2016-03-09 NOTE — Telephone Encounter (Signed)
Refill sent per [facility] refill protocol/SLS  Medication Detail   Disp Refills Start End   glimepiride (AMARYL) 4 MG tablet 30 tablet 2 02/07/2016    Sig - Route: Take 1 tablet (4 mg total) by mouth daily with breakfast. - Oral   Class: No Print   Notes to Pharmacy: This is a new prescription based on recent labs.   Associated Diagnoses   Type 2 diabetes mellitus without complication, with long-term current use of insulin [facility])     Pharmacy   [place], [place]

## 2016-04-03 ENCOUNTER — Ambulatory Visit (INDEPENDENT_AMBULATORY_CARE_PROVIDER_SITE_OTHER): Payer: PPO | Admitting: Family Medicine

## 2016-04-03 VITALS — BP 98/60 | HR 100 | Temp 97.8°F | Resp 18 | Ht 60.0 in | Wt 127.6 lb

## 2016-04-03 DIAGNOSIS — R0602 Shortness of breath: Secondary | ICD-10-CM | POA: Diagnosis not present

## 2016-04-03 DIAGNOSIS — R059 Cough, unspecified: Secondary | ICD-10-CM

## 2016-04-03 DIAGNOSIS — R05 Cough: Secondary | ICD-10-CM | POA: Diagnosis not present

## 2016-04-03 DIAGNOSIS — J209 Acute bronchitis, unspecified: Secondary | ICD-10-CM | POA: Diagnosis not present

## 2016-04-03 MED ORDER — METHYLPREDNISOLONE ACETATE 80 MG/ML IJ SUSP
80.0000 mg | Freq: Once | INTRAMUSCULAR | Status: AC
  Administered 2016-04-03: 80 mg via INTRAMUSCULAR

## 2016-04-03 MED ORDER — PREDNISONE 10 MG PO TABS: ORAL_TABLET | ORAL | 0 refills | Status: DC

## 2016-04-03 MED ORDER — AZITHROMYCIN 250 MG PO TABS: ORAL_TABLET | ORAL | 0 refills | Status: DC

## 2016-04-03 MED ORDER — IPRATROPIUM-ALBUTEROL 0.5-2.5 (3) MG/3ML IN SOLN: 3.0000 mL | Freq: Four times a day (QID) | RESPIRATORY_TRACT | Status: DC

## 2016-04-03 NOTE — Progress Notes (Signed)
Pre visit review using our clinic review tool, if applicable. No additional management support is needed unless otherwise documented below in the visit note. 

## 2016-04-03 NOTE — Patient Instructions (Signed)

## 2016-04-03 NOTE — Progress Notes (Signed)
Subjective:     [name] is a 65 y.o. female here for evaluation of a cough. Onset of symptoms was 1 week ago. Symptoms have been gradually worsening since that time. The cough is productive and is aggravated by exercise and reclining position. Associated symptoms include: shortness of breath, sputum production and wheezing. Patient does not have a history of asthma.---  + copd.   Patient does not have a history of environmental allergens. Patient has not traveled recently. Patient does have a history of smoking. Patient has had a previous chest x-ray. Patient has had a PPD done.  The following portions of the patient's history were reviewed and updated as appropriate: She  has a past medical history of Arthritis; Diabetes mellitus; and Thyroid disease. She  does not have any pertinent problems on file. She  has a past surgical history that includes Cholecystectomy; Abdominal hysterectomy; Back surgery; Tonsilectomy, adenoidectomy, bilateral myringotomy and tubes; and Thyroid surgery. Her family history includes Alcohol abuse in her mother; Breast cancer in her cousin and daughter; Heart disease in her maternal grandfather; Stroke in her maternal grandmother and mother. She  reports that she has been smoking.  She has smoked for the past 48.00 years. She has never used smokeless tobacco. She reports that she does not drink alcohol or use drugs. She has a current medication list which includes the following prescription(s): albuterol, alendronate, alprazolam, one touch ultra 2, celebrex, fluoxetine, fluticasone, fluticasone-salmeterol, glimepiride, glucose blood, hydrochlorothiazide, hydrocodone-acetaminophen, insulin glargine, insulin pen needle, levocetirizine, levothyroxine, levothyroxine, methocarbamol, metronidazole, mometasone, pantoprazole, tizanidine, tramadol, valacyclovir, azithromycin, and prednisone, and the following Facility-Administered Medications: ipratropium-albuterol. Current  Outpatient Prescriptions on File Prior to Visit  Medication Sig Dispense Refill  . albuterol (PROAIR HFA) 108 (90 Base) MCG/ACT inhaler 2 puffs qid prn 1 Inhaler 1  . alendronate (FOSAMAX) 70 MG tablet Take 1 tablet (70 mg total) by mouth every 7 (seven) days. Take with a full glass of water on an empty stomach. 4 tablet 11  . ALPRAZolam (XANAX) 0.5 MG tablet TAKE ONE TABLET BY MOUTH THREE TIMES A DAY AS NEEDED FOR SLEEP OR ANXIETY 30 tablet 0  . Blood Glucose Monitoring Suppl (ONE TOUCH ULTRA 2) w/Device KIT Check blood sugar as directed. Dx:E11.65 1 each 0  . CELEBREX 200 MG capsule TAKE ONE CAPSULE BY MOUTH ONCE DAILY 30 capsule 5  . FLUoxetine (PROZAC) 20 MG capsule TAKE 1 CAPSULE BY MOUTH DAILY 90 capsule 3  . fluticasone (FLONASE) 50 MCG/ACT nasal spray Place 2 sprays into both nostrils daily. 16 g 6  . Fluticasone-Salmeterol (ADVAIR DISKUS) 250-50 MCG/DOSE AEPB Inhale 1 puff into the lungs 2 (two) times daily. 1 each 3  . glimepiride (AMARYL) 4 MG tablet TAKE ONE TABLET BY MOUTH DAILY WITH BREAKFAST. 30 tablet 1  . glucose blood (ONE TOUCH ULTRA TEST) test strip Check blood sugar 4 times per day.E11.65 400 each 2  . hydrochlorothiazide (HYDRODIURIL) 25 MG tablet Take 1 tablet (25 mg total) by mouth daily. 90 tablet 1  . HYDROcodone-acetaminophen (NORCO/VICODIN) 5-325 MG tablet     . Insulin Glargine (LANTUS SOLOSTAR) 100 UNIT/ML Solostar Pen Inject 25 units into the skin at bedtime 15 mL 11  . Insulin Pen Needle (NOVOTWIST) 32G X 5 MM MISC Use daily at Bedtime with Lantus 100 each 0  . levocetirizine (XYZAL) 5 MG tablet Take 1 tablet (5 mg total) by mouth every evening. 30 tablet 5  . levothyroxine (SYNTHROID, LEVOTHROID) 137 MCG tablet TAKE ONE TABLET (137 MCG)  BY MOUTH DAILY BEFORE BREAKFAST. 30 tablet 0  . levothyroxine (SYNTHROID, LEVOTHROID) 137 MCG tablet TAKE ONE TABLET BY MOUTH EVERY MORNING BEFORE BREAKFAST 30 tablet 5  . methocarbamol (ROBAXIN) 500 MG tablet     . metroNIDAZOLE  (METROGEL VAGINAL) 0.75 % vaginal gel Place 1 Applicatorful vaginally at bedtime. x's 5 days 70 g 0  . mometasone (NASONEX) 50 MCG/ACT nasal spray Place 2 sprays into the nose daily. 17 g 12  . pantoprazole (PROTONIX) 40 MG tablet Take 1 tablet (40 mg total) by mouth daily. 30 tablet 11  . tiZANidine (ZANAFLEX) 4 MG tablet Take 4 mg by mouth every 6 (six) hours as needed for muscle spasms.    . traMADol (ULTRAM) 50 MG tablet Take 1 tablet (50 mg total) by mouth every 8 (eight) hours as needed for pain. 30 tablet 0  . valACYclovir (VALTREX) 1000 MG tablet Take 1 tablet (1,000 mg total) by mouth daily. 90 tablet 3   No current facility-administered medications on file prior to visit.    She has No Known Allergies..  Review of Systems Pertinent items are noted in HPI.    Objective:     BP 98/60 (BP Location: Left Arm, Patient Position: Sitting, Cuff Size: Normal)   Pulse 100   Temp 97.8 F (36.6 C) (Oral)   Resp 18   Ht 5' (1.524 m)   Wt 127 lb 9.6 oz (57.9 kg)   SpO2 100%   BMI 24.92 kg/m  General appearance: alert, cooperative, appears stated age and no distress Ears: normal TM's and external ear canals both ears Nose: green discharge, moderate congestion, turbinates red, swollen, moderate maxillary sinus tenderness bilateral Throat: lips, mucosa, and tongue normal; teeth and gums normal Neck: mild anterior cervical adenopathy, supple, symmetrical, trachea midline and thyroid not enlarged, symmetric, no tenderness/mass/nodules Lungs: wheezes bilaterally Heart: regular rate and rhythm, S1, S2 normal, no murmur, click, rub or gallop   Assessment:    Acute Bronchitis    Plan:    Antibiotics per medication orders. Antitussives per medication orders. Avoid exposure to tobacco smoke and fumes. B-agonist inhaler. Call if shortness of breath worsens, blood in sputum, change in character of cough, development of fever or chills, inability to maintain nutrition and hydration. Avoid  exposure to tobacco smoke and fumes. Chest x-ray. Steroid inhaler as ordered. Smoking cessation. depomedrol and pred taper given to pt  

## 2016-04-05 ENCOUNTER — Encounter: Payer: Self-pay | Admitting: Family Medicine

## 2016-04-14 ENCOUNTER — Encounter: Payer: Self-pay | Admitting: Family Medicine

## 2016-04-15 NOTE — Telephone Encounter (Signed)
Amoxicillin is not good for that augmentin 875 bid x 10 days and check cxr

## 2016-04-18 ENCOUNTER — Other Ambulatory Visit: Payer: Self-pay | Admitting: Family Medicine

## 2016-04-20 ENCOUNTER — Other Ambulatory Visit: Payer: Self-pay | Admitting: Family Medicine

## 2016-04-20 ENCOUNTER — Other Ambulatory Visit: Payer: Self-pay | Admitting: Medical

## 2016-04-20 DIAGNOSIS — J209 Acute bronchitis, unspecified: Secondary | ICD-10-CM

## 2016-04-21 MED ORDER — ALPRAZOLAM 0.5 MG PO TABS: ORAL_TABLET | ORAL | 0 refills | Status: DC

## 2016-04-21 NOTE — Telephone Encounter (Signed)
Pt called in because she says that pharmacy doesn't have Rx. She would like to know if provider could send Rx to    [facility] on [facility]

## 2016-04-21 NOTE — Telephone Encounter (Signed)
Received refill request for Xanax 0.'5mg'$  (take 1 tab TID prn for sleep)  Last Rf: 02/26/2016 Last Ov: 04/03/2016 Next Ov: 02/08/2017 UDS: None  Forwarded to Provider for review, approval or denial.

## 2016-04-21 NOTE — Telephone Encounter (Signed)
Faxed hardcopy for Alprazolam to [facility]. [place]

## 2016-04-21 NOTE — Telephone Encounter (Signed)
Hard copy of Rx request faxed to Kristopher Oppenheim at [place]:  [phone].

## 2016-04-21 NOTE — Addendum Note (Signed)
Addended by: Sharen Counter D on: 04/21/2016 06:11 PM   Modules accepted: Orders

## 2016-04-24 ENCOUNTER — Other Ambulatory Visit: Payer: Self-pay

## 2016-04-24 MED ORDER — AMOXICILLIN-POT CLAVULANATE 875-125 MG PO TABS: 1.0000 | ORAL_TABLET | Freq: Two times a day (BID) | ORAL | 0 refills | Status: DC

## 2016-04-24 NOTE — Progress Notes (Signed)
Rx sent to the pharmacy. [facility]

## 2016-06-02 ENCOUNTER — Other Ambulatory Visit: Payer: Self-pay | Admitting: Family Medicine

## 2016-06-02 DIAGNOSIS — E119 Type 2 diabetes mellitus without complications: Secondary | ICD-10-CM

## 2016-06-02 DIAGNOSIS — Z794 Long term (current) use of insulin: Principal | ICD-10-CM

## 2016-06-02 MED ORDER — GLIMEPIRIDE 4 MG PO TABS: 4.0000 mg | ORAL_TABLET | Freq: Every day | ORAL | 4 refills | Status: DC

## 2016-06-16 DIAGNOSIS — M5106 Intervertebral disc disorders with myelopathy, lumbar region: Secondary | ICD-10-CM | POA: Diagnosis not present

## 2016-06-16 DIAGNOSIS — M25511 Pain in right shoulder: Secondary | ICD-10-CM | POA: Diagnosis not present

## 2016-06-16 DIAGNOSIS — Z79899 Other long term (current) drug therapy: Secondary | ICD-10-CM | POA: Diagnosis not present

## 2016-06-16 DIAGNOSIS — Z79891 Long term (current) use of opiate analgesic: Secondary | ICD-10-CM | POA: Diagnosis not present

## 2016-06-16 DIAGNOSIS — G894 Chronic pain syndrome: Secondary | ICD-10-CM | POA: Diagnosis not present

## 2016-07-28 ENCOUNTER — Other Ambulatory Visit: Payer: Self-pay | Admitting: Family Medicine

## 2016-07-28 MED ORDER — ALPRAZOLAM 0.5 MG PO TABS: ORAL_TABLET | ORAL | 0 refills | Status: DC

## 2016-07-28 NOTE — Telephone Encounter (Signed)
Last refill #30 on 04/21/2016 Last office visit on 03/24/2016 Future appt is for 02/08/2017 No uds/no contract

## 2016-07-28 NOTE — Telephone Encounter (Signed)
Faxed hardcopy for Alprazolam to [facility]

## 2016-08-04 ENCOUNTER — Other Ambulatory Visit: Payer: Self-pay | Admitting: Family Medicine

## 2016-08-04 DIAGNOSIS — R0683 Snoring: Secondary | ICD-10-CM

## 2016-08-14 DIAGNOSIS — M47816 Spondylosis without myelopathy or radiculopathy, lumbar region: Secondary | ICD-10-CM | POA: Diagnosis not present

## 2016-08-14 DIAGNOSIS — M7062 Trochanteric bursitis, left hip: Secondary | ICD-10-CM | POA: Diagnosis not present

## 2016-08-14 DIAGNOSIS — M545 Low back pain: Secondary | ICD-10-CM | POA: Diagnosis not present

## 2016-08-14 DIAGNOSIS — M7061 Trochanteric bursitis, right hip: Secondary | ICD-10-CM | POA: Diagnosis not present

## 2016-08-18 ENCOUNTER — Telehealth: Payer: Self-pay | Admitting: Family Medicine

## 2016-08-18 MED ORDER — ROPINIROLE HCL 0.25 MG PO TABS: ORAL_TABLET | ORAL | 0 refills | Status: DC

## 2016-08-18 NOTE — Telephone Encounter (Signed)
Caller name: [name] Relationship to patient: self Can be reached: [phone] Pharmacy: [facility] at [place], [place]  Reason for call: Pt called stating she is having leg pain and would like call back after 1:30pm today. She is wanting to have a med called in for the pain. I advised pt that appt will be needed. Pt states that she would like call back from CMA that Dr. Etter Sjogren is aware she has had 4 back surgeries.

## 2016-08-18 NOTE — Telephone Encounter (Signed)
If it is from her back and she had back surgery then the surgeon should have given something for it If it is restless leg we can try requip 0.'25mg'$  1 po qhs x 2 days then inc to 2 , po qhs x 5 days ----- let us know if we need to inc from there----#60  But if the surgeon says its from her back -- that most likely will not help-- she would need to ask him

## 2016-08-18 NOTE — Telephone Encounter (Signed)
Called left message to call back 

## 2016-08-18 NOTE — Telephone Encounter (Signed)
Patient informed and sent into pharmacy She verbalized understanding.

## 2016-08-18 NOTE — Telephone Encounter (Signed)
Spoke to the patient and she needs a prescription for leg cramps--she has had them for 3 weeks. She was seen by a ortho on Friday and he thought it may be due to her back/arthritis?? Is having them 6 to 8 times per night.  Pharmacy is [facility].

## 2016-08-25 ENCOUNTER — Other Ambulatory Visit: Payer: Self-pay | Admitting: Family Medicine

## 2016-08-25 DIAGNOSIS — G4730 Sleep apnea, unspecified: Secondary | ICD-10-CM

## 2016-08-25 NOTE — Telephone Encounter (Signed)
Patient would like to discuss medication below.

## 2016-08-25 NOTE — Telephone Encounter (Signed)
Called left message to call back 

## 2016-08-25 NOTE — Telephone Encounter (Signed)
Patient had a home sleep test done through othodontist. Showed to be a lot worse than previous sleep test. Ortho MD states the mouth piece will not work, but she needs a mask. Their main concern is that they do not have dental insurance and are afraid that going through the orthodontist is going to be too expensive. They would like PCP to take over this request due to cost being better than using an orthodontist.

## 2016-08-25 NOTE — Telephone Encounter (Signed)
Spoke to the patient and she was ok to refer back to neurology to Dr. Carolynn Comment

## 2016-08-25 NOTE — Telephone Encounter (Signed)
Sleep test was done with neurologist---  She needs to see Dr Carolynn Comment again-- to get cpap

## 2016-09-02 ENCOUNTER — Telehealth: Payer: Self-pay | Admitting: Family Medicine

## 2016-09-02 NOTE — Telephone Encounter (Signed)
Pt called to check on referral. I was able to open thru pts appt desk. [facility] closed the referral without notifying our office. They had the following note.  "Pt had sleep study on 08/09/2015. The sleep study did not show her to have OSA. Pt declined coming back for a follow up visit with [name] to go over sleep study results, [facility]na/sf"  Pt stated that the orthodontist that did home sleep test was supposed to fax [place] and [name] the results. I advised pt we have no notes of receiving the results of home sleep test and provided her our fax #. Pt requested phone # for [name] office as well, which I provided to her.

## 2016-09-02 NOTE — Telephone Encounter (Signed)
Caller name:Scottlynn Relation to pt: self Call back number: [phone] or [phone] Pharmacy:  Reason for call: Pt states Dr Cats is going to send a fax document to our office (orthodontist) for her sleep studys and they need rx for cpad. Pt would like to be called ASAP when fax is received. Pt state will be out of work after 1:00, so please do call after 1:00 pm. Please advise.

## 2016-09-03 NOTE — Telephone Encounter (Signed)
Sounds like she needs to call them and let them know she is ok with f/u appointment

## 2016-09-03 NOTE — Telephone Encounter (Signed)
Message  Received: Yesterday  Message Contents  Richard Miu R sent to Margot Ables        I can schedule pt for a follow up visit with Dr. Brett Fairy. Then they can go from there as to getting her fitted for a mask.

## 2016-09-04 ENCOUNTER — Telehealth: Payer: Self-pay | Admitting: Family Medicine

## 2016-09-04 NOTE — Telephone Encounter (Signed)
Caller name: Relationship to patient: Self Can be reached: [phone]  Pharmacy:  Reason for call: Patient request call back to discuss Sleep Apnea

## 2016-09-09 NOTE — Telephone Encounter (Signed)
Never received sleep study from provider.  See other phone note.  Patient requests a call after 1.  Will try and call tomorrow.

## 2016-09-11 ENCOUNTER — Encounter: Payer: Self-pay | Admitting: Family Medicine

## 2016-09-11 ENCOUNTER — Ambulatory Visit (INDEPENDENT_AMBULATORY_CARE_PROVIDER_SITE_OTHER): Payer: PPO | Admitting: Family Medicine

## 2016-09-11 VITALS — BP 98/68 | HR 94 | Temp 98.4°F | Resp 16 | Ht 60.0 in | Wt 126.6 lb

## 2016-09-11 DIAGNOSIS — H66001 Acute suppurative otitis media without spontaneous rupture of ear drum, right ear: Secondary | ICD-10-CM | POA: Diagnosis not present

## 2016-09-11 DIAGNOSIS — E039 Hypothyroidism, unspecified: Secondary | ICD-10-CM

## 2016-09-11 DIAGNOSIS — R252 Cramp and spasm: Secondary | ICD-10-CM | POA: Diagnosis not present

## 2016-09-11 DIAGNOSIS — J0140 Acute pansinusitis, unspecified: Secondary | ICD-10-CM | POA: Diagnosis not present

## 2016-09-11 DIAGNOSIS — E1165 Type 2 diabetes mellitus with hyperglycemia: Secondary | ICD-10-CM | POA: Diagnosis not present

## 2016-09-11 DIAGNOSIS — E785 Hyperlipidemia, unspecified: Secondary | ICD-10-CM | POA: Diagnosis not present

## 2016-09-11 DIAGNOSIS — Z794 Long term (current) use of insulin: Secondary | ICD-10-CM

## 2016-09-11 DIAGNOSIS — E1151 Type 2 diabetes mellitus with diabetic peripheral angiopathy without gangrene: Secondary | ICD-10-CM

## 2016-09-11 DIAGNOSIS — IMO0002 Reserved for concepts with insufficient information to code with codable children: Secondary | ICD-10-CM

## 2016-09-11 LAB — COMPREHENSIVE METABOLIC PANEL
ALBUMIN: 3.9 g/dL (ref 3.5–5.2)
ALT: 23 U/L (ref 0–35)
AST: 18 U/L (ref 0–37)
Alkaline Phosphatase: 79 U/L (ref 39–117)
BILIRUBIN TOTAL: 0.5 mg/dL (ref 0.2–1.2)
BUN: 17 mg/dL (ref 6–23)
CALCIUM: 9.8 mg/dL (ref 8.4–10.5)
CO2: 29 meq/L (ref 19–32)
CREATININE: 0.63 mg/dL (ref 0.40–1.20)
Chloride: 103 mEq/L (ref 96–112)
GFR: 100.43 mL/min (ref >60.00)
Glucose, Bld: 247 mg/dL — ABNORMAL HIGH (ref 70–99)
Potassium: 4.8 mEq/L (ref 3.5–5.1)
SODIUM: 137 meq/L (ref 135–145)
Total Protein: 7.7 g/dL (ref 6.0–8.3)

## 2016-09-11 LAB — HEMOGLOBIN A1C: Hgb A1c MFr Bld: 11.4 % — ABNORMAL HIGH (ref 4.6–6.5)

## 2016-09-11 LAB — LIPID PANEL
Cholesterol: 211 mg/dL — ABNORMAL HIGH (ref 0–200)
HDL: 49.9 mg/dL (ref >39.00)
LDL Cholesterol: 142 mg/dL — ABNORMAL HIGH (ref 0–99)
NONHDL: 161.05
TRIGLYCERIDES: 95 mg/dL (ref 0.0–149.0)
Total CHOL/HDL Ratio: 4
VLDL: 19 mg/dL (ref 0.0–40.0)

## 2016-09-11 LAB — TSH: TSH: 0.33 u[IU]/mL — ABNORMAL LOW (ref 0.35–4.50)

## 2016-09-11 LAB — MAGNESIUM: Magnesium: 2 mg/dL (ref 1.5–2.5)

## 2016-09-11 LAB — PHOSPHORUS: PHOSPHORUS: 3.3 mg/dL (ref 2.3–4.6)

## 2016-09-11 MED ORDER — AMOXICILLIN-POT CLAVULANATE 875-125 MG PO TABS: 1.0000 | ORAL_TABLET | Freq: Two times a day (BID) | ORAL | 0 refills | Status: DC

## 2016-09-11 NOTE — Patient Instructions (Signed)

## 2016-09-11 NOTE — Assessment & Plan Note (Signed)
Tolerating statin, encouraged heart healthy diet, avoid trans fats, minimize simple carbs and saturated fats. Increase exercise as tolerated 

## 2016-09-11 NOTE — Assessment & Plan Note (Signed)
abx per orders xyzal and nasacort  rto or call prn

## 2016-09-11 NOTE — Assessment & Plan Note (Signed)
hgba1c to be checked, minimize simple carbs. Increase exercise as tolerated. Continue current meds  

## 2016-09-11 NOTE — Telephone Encounter (Signed)
Patient seen in office 09/11/16

## 2016-09-11 NOTE — Progress Notes (Signed)
Patient ID: [name], female   DOB: 06-25-1950, 66 y.o.   MRN: [id number]     Subjective:  I acted as a Education administrator for Dr. Carollee Herter.  Guerry Bruin, [place]   Patient ID: [name], female    DOB: Mar 03, 1951, 66 y.o.   MRN: [id number]  Chief Complaint  Patient presents with  . Otalgia    ringing and poppped yesterday.  Marland Kitchen Results    sleep study    HPI  Patient is in today for possible ear infection and to go over sleep study results that she had done at Dr. Ron Parker office.  Her right ear has been bothering her for about 2 weeks now.  She has pain and ringing.  Her right ear popped yesterday.  She does also have some sinus drainage.  Patient Care Team: Carollee Herter, Alferd Apa, DO as PCP - General (Family Medicine)   Past Medical History:  Diagnosis Date  . Arthritis   . Diabetes mellitus   . Thyroid disease     Past Surgical History:  Procedure Laterality Date  . ABDOMINAL HYSTERECTOMY    . BACK SURGERY     x3  . CHOLECYSTECTOMY    . THYROID SURGERY    . TONSILECTOMY, ADENOIDECTOMY, BILATERAL MYRINGOTOMY AND TUBES      Family History  Problem Relation Age of Onset  . Stroke Mother   . Alcohol abuse Mother   . Stroke Maternal Grandmother   . Heart disease Maternal Grandfather   . Breast cancer Cousin   . Breast cancer Daughter     Social History   Social History  . Marital status: Married    Spouse name: N/A  . Number of children: N/A  . Years of education: N/A   Occupational History  . [employer]   Social History Main Topics  . Smoking status: Current Some Day Smoker    Years: 48.00    Last attempt to quit: 12/03/2014  . Smokeless tobacco: Never Used  . Alcohol use No  . Drug use: No  . Sexual activity: Yes    Partners: Male     Comment: 2 -3 days a week   1 pack lasts 2 weeks   Other Topics Concern  . Not on file   Social History Narrative   Exercise--  3 flts steps a day for 8 hours    Outpatient Medications Prior to Visit    Medication Sig Dispense Refill  . albuterol (PROAIR HFA) 108 (90 Base) MCG/ACT inhaler 2 puffs qid prn 1 Inhaler 1  . alendronate (FOSAMAX) 70 MG tablet Take 1 tablet (70 mg total) by mouth every 7 (seven) days. Take with a full glass of water on an empty stomach. 4 tablet 11  . ALPRAZolam (XANAX) 0.5 MG tablet TAKE ONE TABLET BY MOUTH THREE TIMES A DAY AS NEEDED FOR SLEEP OR ANXIETY 30 tablet 0  . Blood Glucose Monitoring Suppl (ONE TOUCH ULTRA 2) w/Device KIT Check blood sugar as directed. Dx:E11.65 1 each 0  . CELEBREX 200 MG capsule TAKE ONE CAPSULE BY MOUTH ONCE DAILY 30 capsule 5  . FLUoxetine (PROZAC) 20 MG capsule TAKE 1 CAPSULE BY MOUTH ONCE DAILY. 30 capsule 2  . fluticasone (FLONASE) 50 MCG/ACT nasal spray Place 2 sprays into both nostrils daily. 16 g 6  . Fluticasone-Salmeterol (ADVAIR DISKUS) 250-50 MCG/DOSE AEPB Inhale 1 puff into the lungs 2 (two) times daily. 1 each 3  . glimepiride (AMARYL) 4 MG tablet Take 1  tablet (4 mg total) by mouth daily with breakfast. 30 tablet 4  . glucose blood (ONE TOUCH ULTRA TEST) test strip Check blood sugar 4 times per day.E11.65 400 each 2  . hydrochlorothiazide (HYDRODIURIL) 25 MG tablet Take 1 tablet (25 mg total) by mouth daily. 90 tablet 1  . hydrochlorothiazide (HYDRODIURIL) 25 MG tablet TAKE ONE TABLET BY MOUTH DAILY 90 tablet 1  . HYDROcodone-acetaminophen (NORCO/VICODIN) 5-325 MG tablet     . Insulin Glargine (LANTUS SOLOSTAR) 100 UNIT/ML Solostar Pen Inject 25 units into the skin at bedtime 15 mL 11  . Insulin Pen Needle (NOVOTWIST) 32G X 5 MM MISC Use daily at Bedtime with Lantus 100 each 0  . levocetirizine (XYZAL) 5 MG tablet Take 1 tablet (5 mg total) by mouth every evening. 30 tablet 5  . levothyroxine (SYNTHROID, LEVOTHROID) 137 MCG tablet TAKE ONE TABLET BY MOUTH EVERY MORNING BEFORE BREAKFAST 30 tablet 5  . methocarbamol (ROBAXIN) 500 MG tablet     . mometasone (NASONEX) 50 MCG/ACT nasal spray Place 2 sprays into the nose daily.  17 g 12  . pantoprazole (PROTONIX) 40 MG tablet Take 1 tablet (40 mg total) by mouth daily. 30 tablet 11  . rOPINIRole (REQUIP) 0.25 MG tablet Take 1 by mouth at night for 2 days, then increase to 2 at night 60 tablet 0  . tiZANidine (ZANAFLEX) 4 MG tablet Take 4 mg by mouth every 6 (six) hours as needed for muscle spasms.    . traMADol (ULTRAM) 50 MG tablet Take 1 tablet (50 mg total) by mouth every 8 (eight) hours as needed for pain. 30 tablet 0  . valACYclovir (VALTREX) 1000 MG tablet Take 1 tablet (1,000 mg total) by mouth daily. 90 tablet 3  . amoxicillin-clavulanate (AUGMENTIN) 875-125 MG tablet Take 1 tablet by mouth 2 (two) times daily. 20 tablet 0  . azithromycin (ZITHROMAX Z-PAK) 250 MG tablet As directed 6 each 0  . levothyroxine (SYNTHROID, LEVOTHROID) 137 MCG tablet TAKE ONE TABLET (137 MCG) BY MOUTH DAILY BEFORE BREAKFAST. 30 tablet 0  . metroNIDAZOLE (METROGEL VAGINAL) 0.75 % vaginal gel Place 1 Applicatorful vaginally at bedtime. x's 5 days 70 g 0  . predniSONE (DELTASONE) 10 MG tablet 3 po qd x 3 days then 2 po qd x 3 days the 1 po qd x 3 days 18 tablet 0   Facility-Administered Medications Prior to Visit  Medication Dose Route Frequency Provider Last Rate Last Dose  . ipratropium-albuterol (DUONEB) 0.5-2.5 (3) MG/3ML nebulizer solution 3 mL  3 mL Nebulization Q6H Lowne Chase, Yvonne R, DO        No Known Allergies  Review of Systems  Constitutional: Negative for fever and malaise/fatigue.  HENT: Positive for ear pain and tinnitus. Negative for congestion.        Right ear Sinus drainage  Eyes: Negative for blurred vision.  Respiratory: Negative for cough and shortness of breath.   Cardiovascular: Negative for chest pain, palpitations and leg swelling.  Gastrointestinal: Negative for vomiting.  Musculoskeletal: Negative for back pain.  Skin: Negative for rash.  Neurological: Negative for loss of consciousness and headaches.       Objective:    Physical Exam    Constitutional: She is oriented to person, place, and time. She appears well-developed and well-nourished.  HENT:  Right Ear: There is tenderness. Tympanic membrane is injected, erythematous and retracted. Decreased hearing is noted.  Left Ear: External ear and ear canal normal. Tympanic membrane is bulging.  Nose: Right sinus  exhibits maxillary sinus tenderness and frontal sinus tenderness. Left sinus exhibits maxillary sinus tenderness and frontal sinus tenderness.  Mouth/Throat: Posterior oropharyngeal erythema present.  + PND + errythema  Eyes: Conjunctivae are normal. Right eye exhibits no discharge. Left eye exhibits no discharge.  Cardiovascular: Normal rate, regular rhythm and normal heart sounds.   No murmur heard. Pulmonary/Chest: Effort normal and breath sounds normal. No respiratory distress. She has no wheezes. She has no rales. She exhibits no tenderness.  Musculoskeletal: She exhibits no edema.  Lymphadenopathy:    She has cervical adenopathy.  Neurological: She is alert and oriented to person, place, and time.  Nursing note and vitals reviewed.   BP 98/68 (BP Location: Left Arm, Cuff Size: Normal)   Pulse 94   Temp 98.4 F (36.9 C) (Oral)   Resp 16   Ht 5' (1.524 m)   Wt 126 lb 9.6 oz (57.4 kg)   SpO2 96%   BMI 24.72 kg/m  Wt Readings from Last 3 Encounters:  09/11/16 126 lb 9.6 oz (57.4 kg)  04/03/16 127 lb 9.6 oz (57.9 kg)  02/07/16 127 lb 9.6 oz (57.9 kg)   BP Readings from Last 3 Encounters:  09/11/16 98/68  04/03/16 98/60  02/07/16 100/62     Immunization History  Administered Date(s) Administered  . DTaP 01/24/1979  . Influenza Whole 01/22/2010, 02/05/2012  . Influenza,inj,Quad PF,36+ Mos 02/19/2014  . Influenza-Unspecified 01/17/2015, 01/28/2016  . Pneumococcal Conjugate-13 07/08/2015  . Pneumococcal Polysaccharide-23 01/05/2009, 02/19/2014  . Td 01/24/2007  . Zoster 02/16/2012    Health Maintenance  Topic Date Due  . HEMOGLOBIN A1C   01/07/2016  . MAMMOGRAM  02/20/2016  . URINE MICROALBUMIN  03/27/2016  . COLONOSCOPY  02/06/2017 (Originally 07/04/2000)  . INFLUENZA VACCINE  11/04/2016  . TETANUS/TDAP  01/23/2017  . OPHTHALMOLOGY EXAM  01/28/2017  . FOOT EXAM  02/06/2017  . PNA vac Low Risk Adult (2 of 2 - PPSV23) 02/20/2019  . DEXA SCAN  Completed  . Hepatitis C Screening  Completed    Lab Results  Component Value Date   WBC 7.7 03/28/2015   HGB 14.9 03/28/2015   HCT 45.4 03/28/2015   PLT 227.0 03/28/2015   GLUCOSE 186 (H) 02/07/2016   CHOL 216 (H) 02/07/2016   TRIG 154.0 (H) 02/07/2016   HDL 50.00 02/07/2016   LDLDIRECT 162.0 03/28/2015   LDLCALC 135 (H) 02/07/2016   ALT 15 02/07/2016   AST 16 02/07/2016   NA 138 02/07/2016   K 4.4 02/07/2016   CL 100 02/07/2016   CREATININE 0.62 02/07/2016   BUN 15 02/07/2016   CO2 31 02/07/2016   TSH 0.75 02/07/2016   HGBA1C 9.3 (H) 07/08/2015   MICROALBUR 1.5 03/28/2015    Lab Results  Component Value Date   TSH 0.75 02/07/2016   Lab Results  Component Value Date   WBC 7.7 03/28/2015   HGB 14.9 03/28/2015   HCT 45.4 03/28/2015   MCV 93.9 03/28/2015   PLT 227.0 03/28/2015   Lab Results  Component Value Date   NA 138 02/07/2016   K 4.4 02/07/2016   CO2 31 02/07/2016   GLUCOSE 186 (H) 02/07/2016   BUN 15 02/07/2016   CREATININE 0.62 02/07/2016   BILITOT 0.5 02/07/2016   ALKPHOS 94 02/07/2016   AST 16 02/07/2016   ALT 15 02/07/2016   PROT 7.8 02/07/2016   ALBUMIN 4.1 02/07/2016   CALCIUM 10.1 02/07/2016   GFR 102.49 02/07/2016   Lab Results  Component Value Date  CHOL 216 (H) 02/07/2016   Lab Results  Component Value Date   HDL 50.00 02/07/2016   Lab Results  Component Value Date   LDLCALC 135 (H) 02/07/2016   Lab Results  Component Value Date   TRIG 154.0 (H) 02/07/2016   Lab Results  Component Value Date   CHOLHDL 4 02/07/2016   Lab Results  Component Value Date   HGBA1C 9.3 (H) 07/08/2015         Assessment & Plan:     Problem List Items Addressed This Visit      Unprioritized   Acute pansinusitis - Primary    abx per orders xyzal and nasacort  rto or call prn      Relevant Medications   amoxicillin-clavulanate (AUGMENTIN) 875-125 MG tablet   Diabetes mellitus type II, uncontrolled ([place])    hgba1c to be checked, minimize simple carbs. Increase exercise as tolerated. Continue current meds       Hyperlipidemia LDL goal <70    Tolerating statin, encouraged heart healthy diet, avoid trans fats, minimize simple carbs and saturated fats. Increase exercise as tolerated      Relevant Orders   Lipid panel    Other Visit Diagnoses    DM (diabetes mellitus) type II uncontrolled, periph vascular disorder ([place])       Relevant Orders   Hemoglobin A1c   Comprehensive metabolic panel   Hypothyroidism, unspecified type       Relevant Orders   TSH   Leg cramps       Relevant Orders   Comprehensive metabolic panel   Magnesium   Phosphorus   Acute suppurative otitis media of right ear without spontaneous rupture of tympanic membrane, recurrence not specified       Relevant Medications   amoxicillin-clavulanate (AUGMENTIN) 875-125 MG tablet      I have discontinued Ms. Verrill's metroNIDAZOLE, azithromycin, predniSONE, and amoxicillin-clavulanate. I am also having her start on amoxicillin-clavulanate. Additionally, I am having her maintain her Insulin Pen Needle, traMADol, methocarbamol, tiZANidine, valACYclovir, CELEBREX, pantoprazole, HYDROcodone-acetaminophen, alendronate, levocetirizine, Insulin Glargine, glucose blood, ONE TOUCH ULTRA 2, mometasone, fluticasone, albuterol, Fluticasone-Salmeterol, hydrochlorothiazide, levothyroxine, hydrochlorothiazide, FLUoxetine, glimepiride, ALPRAZolam, and rOPINIRole. We will continue to administer ipratropium-albuterol.  Meds ordered this encounter  Medications  . amoxicillin-clavulanate (AUGMENTIN) 875-125 MG tablet    Sig: Take 1 tablet by mouth 2 (two) times  daily.    Dispense:  20 tablet    Refill:  0    CMA served as scribe during this visit. History, Physical and Plan performed by medical provider. Documentation and orders reviewed and attested to.  [name], DO

## 2016-09-14 ENCOUNTER — Other Ambulatory Visit: Payer: Self-pay | Admitting: Family Medicine

## 2016-09-14 DIAGNOSIS — E785 Hyperlipidemia, unspecified: Secondary | ICD-10-CM

## 2016-09-14 DIAGNOSIS — E0590 Thyrotoxicosis, unspecified without thyrotoxic crisis or storm: Secondary | ICD-10-CM

## 2016-09-14 DIAGNOSIS — E119 Type 2 diabetes mellitus without complications: Secondary | ICD-10-CM

## 2016-09-14 MED ORDER — LEVOTHYROXINE SODIUM 125 MCG PO TABS: 125.0000 ug | ORAL_TABLET | Freq: Every day | ORAL | 3 refills | Status: DC

## 2016-09-15 ENCOUNTER — Encounter: Payer: Self-pay | Admitting: Family Medicine

## 2016-09-25 ENCOUNTER — Telehealth: Payer: Self-pay | Admitting: Family Medicine

## 2016-09-25 NOTE — Telephone Encounter (Signed)
FYI

## 2016-09-25 NOTE — Telephone Encounter (Signed)
Pt wanted to notify provider that she has 2 cortisone shots and a 7 day prednisone pack about 2 weeks before her last labs (09/11/16) which may have elevated her sugar levels.

## 2016-09-27 NOTE — Telephone Encounter (Signed)
noted 

## 2016-09-28 ENCOUNTER — Other Ambulatory Visit: Payer: Self-pay | Admitting: Family Medicine

## 2016-09-29 NOTE — Telephone Encounter (Signed)
Pharmacy request refill on alprazolam  Last written: 07/28/16  Last ov: 09/11/16 Next ov: 02/5817 Contract: Not on file UDS: No UDS on file

## 2016-09-29 NOTE — Telephone Encounter (Signed)
rx faxed to [facility]

## 2016-10-12 ENCOUNTER — Encounter: Payer: Self-pay | Admitting: Neurology

## 2016-10-12 ENCOUNTER — Ambulatory Visit (INDEPENDENT_AMBULATORY_CARE_PROVIDER_SITE_OTHER): Payer: PPO | Admitting: Neurology

## 2016-10-12 VITALS — BP 125/78 | HR 119

## 2016-10-12 DIAGNOSIS — R0683 Snoring: Secondary | ICD-10-CM

## 2016-10-12 DIAGNOSIS — J324 Chronic pansinusitis: Secondary | ICD-10-CM | POA: Diagnosis not present

## 2016-10-12 DIAGNOSIS — Z9889 Other specified postprocedural states: Secondary | ICD-10-CM

## 2016-10-12 DIAGNOSIS — G4733 Obstructive sleep apnea (adult) (pediatric): Secondary | ICD-10-CM

## 2016-10-12 NOTE — Patient Instructions (Signed)
Hypersomnia Hypersomnia is when you feel extremely tired during the day even though you're getting plenty of sleep at night. You may need to take naps during the day, and you may also be extremely difficult to wake up when you are sleeping. What are the causes? The cause of your hypersomnia may not be known. Hypersomnia may be caused by:  Medicines.  Sleep disorders, such as narcolepsy.  Trauma or injury to your head or nervous system.  Using drugs or alcohol.  Tumors.  Medical conditions, such as depression or hypothyroidism.  Genetics.  What are the signs or symptoms? The main symptoms of hypersomnia include:  Feeling extremely tired throughout the day.  Being very difficult to wake up.  Sleeping for longer and longer periods.  Taking naps throughout the day.  Other symptoms may include:  Feeling: ? Restless. ? Annoyed. ? Anxious. ? Low energy.  Having difficulty: ? Remembering. ? Speaking. ? Thinking.  Losing your appetite.  Experiencing hallucinations.  How is this diagnosed? Hypersomnia may be diagnosed by:  Medical history and physical exam. This will include a sleep history.  Completing sleep logs.  Tests may also be done, such as: ? Polysomnography. ? Multiple sleep latency test (MSLT).  How is this treated? There is no cure for hypersomnia, but treatment can be very effective in helping manage the condition. Treatment may include:  Lifestyle and sleeping strategies to help cope with the condition.  Stimulant medicines.  Treating any underlying causes of hypersomnia.  Follow these instructions at home:  Take medicines only as directed by your health care provider.  Schedule short naps for when you feel sleepiest during the day. Tell your employer or teachers that you have hypersomnia. You may be able to adjust your schedule to include time for naps.  Avoid drinking alcohol or caffeinated beverages.  Do not eat a heavy meal before  bedtime. Eat at about the same times every day.  Do not drive or operate heavy machinery if you are sleepy.  Do not swim or go out on the water without a life jacket.  If possible, adjust your schedule so that you do not have to work or be active at night.  Keep all follow-up visits as directed by your health care provider. This is important. Contact a health care provider if:  You have new symptoms.  Your symptoms get worse. Get help right away if: You have serious thoughts of hurting yourself or someone else. This information is not intended to replace advice given to you by your health care provider. Make sure you discuss any questions you have with your health care provider. Document Released: 03/13/2002 Document Revised: 08/29/2015 Document Reviewed: 10/26/2013 Elsevier Interactive Patient Education  2018 [employer]  

## 2016-10-12 NOTE — Progress Notes (Signed)
SLEEP MEDICINE CLINIC   Provider:  Melvyn Novas, M D  Referring Provider: Zola Button, Grayling Congress, * Primary Care Physician:  Zola Button, Grayling Congress, DO  Chief Complaint  Patient presents with  . Follow-up    HPI:  [name] is a 66 y.o. female , was seen here one year ago as a referral   from Dr. Loreen Freud for a sleep consultation.    I have pleasure of seeing [name] today again, on 10/12/2016 I had previously seen her in 2017 and she was scheduled for a sleep study. A polysomnography was performed on 08/09/2015. Introducing the patient as a female with a BMI of 25, an Epworth sleepiness score of 18 points, with arthritis, diabetes, thyroid disease and status post UPPP. The patient's AHI was only 5.1 very low but exacerbated during REM sleep to 27.1 the patient also had some prolonged oxygen desaturations 107 minutes between or at 89% S/P OT. She had mild periodic limb movements. The impression was that she did not have significant apnea but hypoxemia. 4 snoring therapy I suggested an oral appliance and the patient was referred to Dr. caps were then confirmed that because of her UPPP she was not a candidate for a dental device. He performed a home sleep test on 08/24/2016, which revealed only 26 minutes of at or below 88% oxygen saturation, a respiratory disturbance index of 11 per hour, and a pulse variation between 40 and 132 bpm. This again confirms have mild apnea but no longer hypoxemia to be present the RDI was 33.8 still indicating that this patient snores. In spite of the UPPP the only treatment option that would treat snoring in a patient after UPPP would be use of his CPAP. She is no longer as sleepy as she was last year with an Epworth sleepiness score at 4 and the fatigue severity of only 9 points and she is not depressed. Since she cannot have a dental device I would now try to perform a CPAP titration.      2017. Chief complaint according to patient : according to  the patient she was tested for sleep apnea over 20 years ago and was told that her diagnosis was that of sleep apnea but no follow-up was initiated as she recalls. Her husband meanwhile has become more and more aware of her loud snoring and he has started to contract apneas and is very concerned. The patient has a history of hypertension, diabetes, hyperlipidemia. She does not carry a diagnosis of asthma or COPD but uses when necessary albuterol inhaler. , 2 back surgeries to the upper and 2 to the lower spine, hysterectomy, cholecystectomy,  sinus surgery and UPPP- this surgery did not help! . She also has Flonase prescribed. Dr. Laury Axon diagnosed further osteo- arthritis and thyroid disease.  Mr. Guimond added that his spouse can sleep in any situation at anytime of the day within a very short period of being at rest or relaxed.the couple has slept in different bedrooms for the last 2 decades, and he states that when his wife goes to bed he can hear within minutes her loud, thunderous snoring. But she is mostly bothered by her daytime sleepiness and fatigue the feeling of not being refreshed or restored after a night's sleep.  Sleep habits are as follows: and going to bed as early as 9:30 PM she is already asleep within minutes. She does not share her bedroom, which is described as cool, quiet and dark. She reports  that she falls asleep in the prone position but she uses only one pillow. She has 1-2 bathroom breaks interrupting her sleep. She relies on an alarm for the morning when she rises at 5:30 AM. Overall she may get 7 hours of sleep or more. She still is not refreshed or restored in the morning, she reports a very dry mouth, infrequently has headaches. She has not been woken by headaches but the headaches are present when she wakes. She reports vivid dreams, not nightmarish in character.  She drinks coffeine free coffee in AM, no soda and not iced tea.  She is a smoker, 2-3 cigs a day. ETOH: none.     Sleep medical history and family sleep history: She snored all her live, underwent UPPP without regaining control. Was tested for apnea in 1994? No follow up, no CPAP follow.  Had tonsillectomy in childhood.  No slep walking history, no night terrors, no enuresis.  The patient reports that neither her parents was affected by sleep apnea. Her brother and sister in law now use CPAP and feel great.   Social history:  The couple sleeps for over 28 years in separate rooms, has 2 grownup daughters , 57 and 61,  her daughter and her husband live next door, with 2 grandchildren.   Review of Systems: Out of a complete 14 system review, the patient complains of only the following symptoms, and all other reviewed systems are negative. No alcohol, no caffeine and 3 cigarettes per day.   Epworth score  11- 18 , depending on the time of day. , Fatigue severity score 27  , depression score n/a    Social History   Social History  . Marital status: Married    Spouse name: N/A  . Number of children: N/A  . Years of education: N/A   Occupational History  . [employer]   Social History Main Topics  . Smoking status: Current Some Day Smoker    Years: 48.00    Last attempt to quit: 12/03/2014  . Smokeless tobacco: Never Used  . Alcohol use No  . Drug use: No  . Sexual activity: Yes    Partners: Male     Comment: 2 -3 days a week   1 pack lasts 2 weeks   Other Topics Concern  . Not on file   Social History Narrative   Exercise--  3 flts steps a day for 8 hours    Family History  Problem Relation Age of Onset  . Stroke Mother   . Alcohol abuse Mother   . Stroke Maternal Grandmother   . Heart disease Maternal Grandfather   . Breast cancer Cousin   . Breast cancer Daughter     Past Medical History:  Diagnosis Date  . Arthritis   . Diabetes mellitus   . Thyroid disease     Past Surgical History:  Procedure Laterality Date  . ABDOMINAL HYSTERECTOMY    . BACK  SURGERY     x3  . CHOLECYSTECTOMY    . THYROID SURGERY    . TONSILECTOMY, ADENOIDECTOMY, BILATERAL MYRINGOTOMY AND TUBES      Current Outpatient Prescriptions  Medication Sig Dispense Refill  . albuterol (PROAIR HFA) 108 (90 Base) MCG/ACT inhaler 2 puffs qid prn 1 Inhaler 1  . ALPRAZolam (XANAX) 0.5 MG tablet TAKE ONE TABLET BY MOUTH THREE TIMES A DAY AS NEEDED FOR SLEEP OR ANXIETY 30 tablet 0  . Blood Glucose Monitoring Suppl (ONE TOUCH ULTRA  2) w/Device KIT Check blood sugar as directed. Dx:E11.65 1 each 0  . CELEBREX 200 MG capsule TAKE ONE CAPSULE BY MOUTH ONCE DAILY 30 capsule 5  . FLUoxetine (PROZAC) 20 MG capsule TAKE 1 CAPSULE BY MOUTH ONCE DAILY. 30 capsule 2  . fluticasone (FLONASE) 50 MCG/ACT nasal spray Place 2 sprays into both nostrils daily. 16 g 6  . glimepiride (AMARYL) 4 MG tablet Take 1 tablet (4 mg total) by mouth daily with breakfast. 30 tablet 4  . glucose blood (ONE TOUCH ULTRA TEST) test strip Check blood sugar 4 times per day.E11.65 400 each 2  . hydrochlorothiazide (HYDRODIURIL) 25 MG tablet Take 1 tablet (25 mg total) by mouth daily. 90 tablet 1  . hydrochlorothiazide (HYDRODIURIL) 25 MG tablet TAKE ONE TABLET BY MOUTH DAILY 90 tablet 1  . HYDROcodone-acetaminophen (NORCO/VICODIN) 5-325 MG tablet     . Insulin Glargine (LANTUS SOLOSTAR) 100 UNIT/ML Solostar Pen Inject 25 units into the skin at bedtime (Patient taking differently: Inject 30 units into the skin at bedtime) 15 mL 11  . Insulin Pen Needle (NOVOTWIST) 32G X 5 MM MISC Use daily at Bedtime with Lantus 100 each 0  . levocetirizine (XYZAL) 5 MG tablet Take 1 tablet (5 mg total) by mouth every evening. 30 tablet 5  . levothyroxine (SYNTHROID, LEVOTHROID) 125 MCG tablet Take 1 tablet (125 mcg total) by mouth daily before breakfast. 30 tablet 3  . mometasone (NASONEX) 50 MCG/ACT nasal spray Place 2 sprays into the nose daily. 17 g 12  . pantoprazole (PROTONIX) 40 MG tablet Take 1 tablet (40 mg total) by mouth  daily. 30 tablet 11  . rOPINIRole (REQUIP) 0.25 MG tablet Take 1 by mouth at night for 2 days, then increase to 2 at night 60 tablet 0  . traMADol (ULTRAM) 50 MG tablet Take 1 tablet (50 mg total) by mouth every 8 (eight) hours as needed for pain. 30 tablet 0   Current Facility-Administered Medications  Medication Dose Route Frequency Provider Last Rate Last Dose  . ipratropium-albuterol (DUONEB) 0.5-2.5 (3) MG/3ML nebulizer solution 3 mL  3 mL Nebulization Q6H Roma Schanz R, DO        Allergies as of 10/12/2016  . (No Known Allergies)    Vitals: BP 125/78   Pulse (!) 119  Last Weight:  Wt Readings from Last 1 Encounters:  09/11/16 126 lb 9.6 oz (57.4 kg)   NHA:FBXUX is no height or weight on file to calculate BMI.     Last Height:   Ht Readings from Last 1 Encounters:  09/11/16 5' (1.524 m)    Physical exam:  General: The patient is awake, alert and appears not in acute distress. The patient is well groomed. Head: Normocephalic, atraumatic. Neck is supple. Mallampati 4 , status post UPPP,  neck circumference: 15 inches . Nasal airflow both nostrils restricted, actually occluded. She has severe sinus disease , pharyngitis  and rhinitis  , TMJ is not  evident . Retrognathia is not seen.   Neurologic exam :The patient is awake and alert, oriented to place and time.  Memory subjective described as intact.  Attention span & concentration ability appears normal. Speech is fluent,  without dysarthria.Mood and affect are appropriate.  Cranial nerves:Pupils are equal and briskly reactive to light.Visual fields by finger perimetry are intact. Hearing to finger rub intact. Facial sensation intact to fine touch. Facial motor strength is symmetric and tongue and uvula move midline. Shoulder shrug was symmetrical.   Motor exam:  Normal tone, muscle bulk and symmetric strength in all extremities. Her finger joints lock up and crackle . Restricted ROM.  Finger-to-nose maneuver normal  without evidence of ataxia, dysmetria or tremor. Deep tendon reflexes: in the  upper and lower extremities are symmetric and intact.   Dear Dr. Etter Sjogren,   Thank you for sharing this interesting and pleasant patient with me. She will need to undergo an attended sleep study and titration ( if indicated ) as a patient with UPPP.  The patient was advised of the nature of the diagnosed sleep disorder , the treatment options and risks for general a health and wellness arising from not treating the condition. I spent more than 30 minutes of face to face time with the patient. Greater than 50% of time was spent in counseling and coordination of care. We have discussed the diagnosis and differential and I answered the patient's questions.     Assessment:  After physical and neurologic examination, review of laboratory studies,  Personal review of imaging studies, reports of other /same  Imaging studies ,  Results of polysomnography/ neurophysiology testing and pre-existing records as far as provided in visit., my assessment is   1) [name] clearly has a long-standing history of sleep disordered breathing, beginning the thunderous snoring and childhood that has led to her and her husband having different bedrooms for almost 3 decades. After she was diagnosed for the first time with obstructive sleep apnea she seemed not to be keen on CPAP and chose to undergone UPPP instead. The surgery was an unpleasant experience and also has left her with a higher risk of aspiration and dysphagia. Her sleep apnea study confirmed a mild degree of apnea, the attended sleep study also indicated a prolonged period of hypoxemia. Because her snoring index was so much higher than her apnea index I have referred her to Dr. caps: Return stated that he could not make a dental device for patient that is status post you P PPD. He repeated a home sleep test which confirmed again mild apnea with a much higher snoring arousal index here the AHI  was 11.0 and the RDI 33.6 and supine. Hypoxemia was not nearly as prolonged at or below 88% saturation only for 26 minutes there was a significant degree of tachybradycardia arrhythmia. For this reason we are now deciding to start the patient on CPAP she will come in for an attended sleep study titration. I will instruct the technologist to do very gentle pressure increases by 1 cm intervals, and preferably to use a nasal mask.  She has chronic and frequent sinus disease and rhinitis. She had 2 anterior fusions which also may have changed her upper airway. I will prescribe a nasal spray for her to increase the nasal patency for airflow, but is hoping that she will not be frustrated by using CPAP or developing headaches in response to CPAP.    Plan:  Treatment plan and additional workup :    Larey Seat MD  10/12/2016   CC: [name], [address] [place], [place] [address]

## 2016-10-30 ENCOUNTER — Other Ambulatory Visit: Payer: Self-pay | Admitting: Family Medicine

## 2016-10-30 DIAGNOSIS — E119 Type 2 diabetes mellitus without complications: Secondary | ICD-10-CM

## 2016-10-30 DIAGNOSIS — Z794 Long term (current) use of insulin: Principal | ICD-10-CM

## 2016-11-04 DIAGNOSIS — M545 Low back pain: Secondary | ICD-10-CM | POA: Diagnosis not present

## 2016-11-04 DIAGNOSIS — G894 Chronic pain syndrome: Secondary | ICD-10-CM | POA: Diagnosis not present

## 2016-11-04 DIAGNOSIS — Z6822 Body mass index (BMI) 22.0-22.9, adult: Secondary | ICD-10-CM | POA: Diagnosis not present

## 2016-11-04 DIAGNOSIS — M5106 Intervertebral disc disorders with myelopathy, lumbar region: Secondary | ICD-10-CM | POA: Diagnosis not present

## 2016-11-05 ENCOUNTER — Telehealth: Payer: Self-pay | Admitting: Family Medicine

## 2016-11-05 ENCOUNTER — Ambulatory Visit (INDEPENDENT_AMBULATORY_CARE_PROVIDER_SITE_OTHER): Payer: PPO | Admitting: Neurology

## 2016-11-05 DIAGNOSIS — Z9889 Other specified postprocedural states: Secondary | ICD-10-CM | POA: Diagnosis not present

## 2016-11-05 DIAGNOSIS — R0683 Snoring: Secondary | ICD-10-CM

## 2016-11-05 DIAGNOSIS — G4733 Obstructive sleep apnea (adult) (pediatric): Secondary | ICD-10-CM

## 2016-11-05 NOTE — Telephone Encounter (Signed)
Caller name: Joycelyn Schmid Relation to pt: Pharmacist Call back number: [phone] Pharmacy: [facility]   Reason for call: Pharmacist has a question about pt's next rx for the tyroid levothyroxine (SYNTHROID, LEVOTHROID) 125 MCG tablet, also pt has another type of tyroid meds and pharmacist wants to clarify which is the correct rx for pt . Please advise.

## 2016-11-05 NOTE — Telephone Encounter (Signed)
Informed the patient per 09/14/2016 results PCP changed her to the 125 mcg of thyroid medication. The patient informed the pharmacist that she has been taking both 137 mcg and 125 mcg that no one told her to stop the 137 mcg.   The pharmacist will inform her that she is to be on only the 125 mcg of thyroid medication. It was/is clearly documented the patient was spoken to and given her results/instructions on 09/14/2016. She does have an upcoming lab appt on 11/20/16 as instructed to do on lab results. Will route this message to PCP to review (know she is out of the office for vacation), but do want her to be aware of this once next labs are resulted .  Will also send to covering provider for the day .

## 2016-11-05 NOTE — Telephone Encounter (Signed)
I would recommend changing her appt to 6 weeks out for now. Reinforce that she should only be taking the 125 mcg dose again. Dr. Etter Sjogren can change this lab visit if she wishes upon her return. TY.

## 2016-11-05 NOTE — Telephone Encounter (Signed)
Notified the patient and she did get the message from the pharmacist and she currently is taking the 125 mcg only . Did go ahead and canceled her 11/20/16 lab appt and rescheduled for 12/14/2016. She did verbalize understanding of her instructions.

## 2016-11-09 ENCOUNTER — Other Ambulatory Visit: Payer: Self-pay | Admitting: Family Medicine

## 2016-11-09 DIAGNOSIS — Z1231 Encounter for screening mammogram for malignant neoplasm of breast: Secondary | ICD-10-CM | POA: Diagnosis not present

## 2016-11-09 MED ORDER — FLUOXETINE HCL 20 MG PO CAPS: 20.0000 mg | ORAL_CAPSULE | Freq: Every day | ORAL | 2 refills | Status: DC

## 2016-11-13 ENCOUNTER — Other Ambulatory Visit: Payer: Self-pay | Admitting: Orthopaedic Surgery

## 2016-11-13 DIAGNOSIS — M5106 Intervertebral disc disorders with myelopathy, lumbar region: Secondary | ICD-10-CM

## 2016-11-18 ENCOUNTER — Other Ambulatory Visit: Payer: Self-pay | Admitting: Neurology

## 2016-11-18 DIAGNOSIS — Z9889 Other specified postprocedural states: Secondary | ICD-10-CM

## 2016-11-18 DIAGNOSIS — G4733 Obstructive sleep apnea (adult) (pediatric): Secondary | ICD-10-CM

## 2016-11-18 NOTE — Addendum Note (Signed)
Addended by: Larey Seat on: 11/18/2016 05:43 PM   Modules accepted: Orders

## 2016-11-18 NOTE — Procedures (Signed)
PATIENT'S NAME:  [name], [name] DOB:      February 24, 1951      MR#:    [id number]     DATE OF RECORDING: 11/05/2016 REFERRING M.D.:  Garnet Koyanagi- Chase, DO Study Performed:   Titration to CPAP HISTORY:  This female patient returned for CPAP titration, following a referral for dental /alternative treatment options. The patient is status post UPPP. Her baseline study revealed only an AHI of 5.1./hr. but REM AHI was 27.1/hr. and there were 101 minutes of total oxygen desaturation time, associated symptoms of severe hypersomnia. The patient endorsed the Epworth Sleepiness Scale at 4/24 points.   The patient's weight 126 pounds with a height of 60 (inches), resulting in a BMI of 24.7 kg/m2. The patient's neck circumference measured 15 inches.  CURRENT MEDICATIONS: Pro air, Xanax, Celebrex, Prozac, Flonase, Amaryl, Hydrochlorothiazide, Norco, Insulin, Xyzal, Synthroid, Nasonex, Protonix, Requip, Ultram    PROCEDURE:  This is a multichannel digital polysomnogram utilizing the SomnoStar 11.2 system.  Electrodes and sensors were applied and monitored per AASM Specifications.   EEG, EOG, Chin and Limb EMG, were sampled at 200 Hz.  ECG, Snore and Nasal Pressure, Thermal Airflow, Respiratory Effort, CPAP Flow and Pressure, Oximetry was sampled at 50 Hz. Digital video and audio were recorded.       CPAP was initiated at 5 cmH20 with heated humidity per AASM split night standards and pressure was advanced to 9/9cmH20 because of hypopneas, apneas and desaturations.  At a PAP pressure of 9 cmH20, there was a reduction of the AHI to 0.0  with improvement of obstructive sleep apnea.    Lights Out was at 22:42 and Lights On at 04:59. Total recording time (TRT) was 378 minutes, with a total sleep time (TST) of 304.5 minutes. The patient's sleep latency was 38.5 minutes with 0 minutes of wake time after sleep onset. REM latency was 215.5 minutes.  The sleep efficiency was 80.6 %.    SLEEP ARCHITECTURE: WASO (Wake after sleep  onset) was 43 minutes.  There were 25 minutes in Stage N1, 125.5 minutes Stage N2, 115.5 minutes Stage N3 and 38.5 minutes in Stage REM.  The percentage of Stage N1 was 8.2%, Stage N2 was 41.2%, Stage N3 was 37.9% and Stage R (REM sleep) was 12.6%.   RESPIRATORY ANALYSIS:  There was a total of 1 respiratory event: 0 apneas and 1 hypopnea with a hypopnea index of 0.2/hour. The patient also had 0 respiratory event related arousals (RERAs).     The total APNEA/HYPOPNEA INDEX  (AHI) was .2 /hour and the total RESPIRATORY DISTURBANCE INDEX was 0.2 .hour  0 events occurred in REM sleep and 1 events in NREM. The REM AHI was 0 /hour versus a non-REM AHI of 0.2 /hour.  The patient spent 134 minutes of total sleep time in the supine position and 171 minutes in non-supine. The supine AHI was 0.4, versus a non-supine AHI of 0.0.  OXYGEN SATURATION & C02:  The baseline 02 saturation was 95%, with the lowest being 89%. Time spent below 89% saturation equaled 0 minutes.  PERIODIC LIMB MOVEMENTS:    The patient had a total of 24 Periodic Limb Movements. The Periodic Limb Movement (PLM) index was 4.7 and the PLM Arousal index was 0 /hour.   Post-study, the patient indicated that sleep was deep and uninterrupted.   DIAGNOSIS Primary REM dependent Obstructive Sleep Apnea, responding to CPAP at 9 cm water with heated humidity. I will order a CPAP machine than has auto-titration capacity,  and add 1 cm airsense.  The patient was fitted with a Respironics Dream wear small apparatus.  PLANS/RECOMMENDATIONS:  A follow up appointment will be scheduled in the Sleep Clinic at [facility].   Please call [phone] with any questions.      I certify that I have reviewed the entire raw data recording prior to the issuance of this report in accordance with the Standards of Accreditation of the American Academy of Sleep Medicine (AASM)   Larey Seat, M.D.  11-18-2016 Diplomat, American Board of  Psychiatry and Neurology  Diplomat, [place] of Sleep Medicine Medical Director, [place] [facility] at [facility]

## 2016-11-19 ENCOUNTER — Telehealth: Payer: Self-pay | Admitting: Neurology

## 2016-11-19 NOTE — Telephone Encounter (Signed)
Called to discuss sleep study results. No answer at this time. LVM for pt to return call.

## 2016-11-19 NOTE — Telephone Encounter (Signed)
I called pt. I advised pt that Dr. Brett Fairy reviewed their sleep study results and found that pt does have obstructive sleep apnea. Dr. Brett Fairy recommends that pt starts CPAP. I reviewed PAP compliance expectations with the pt. Pt is agreeable to starting a CPAP. I advised pt that an order will be sent to a DME, [facility], and [facility] will call the pt within about one week after they file with the pt's insurance. [facility] will show the pt how to use the machine, fit for masks, and troubleshoot the CPAP if needed. A follow up appt was made for insurance purposes with Dr. Brett Fairy on February 15 2017 at 10:00 am. Pt verbalized understanding to arrive 15 minutes early and bring their CPAP. A letter with all of this information in it will be mailed to the pt as a reminder. I verified with the pt that the address we have on file is correct. Pt verbalized understanding of results. Pt had no questions at this time but was encouraged to call back if questions arise.

## 2016-11-19 NOTE — Telephone Encounter (Signed)
Pt returned Rn's call. She can be reached after 1:30 at (w) [phone]

## 2016-11-19 NOTE — Telephone Encounter (Signed)
-----   Message from Larey Seat, MD sent at 11/18/2016  5:43 PM EDT ----- Primary REM dependent Obstructive Sleep Apnea, responding to CPAP  at 9 cm water with heated humidity. I will order a CPAP machine than has auto-titration capacity, and  add 1 cm airsense. The patient was fitted with a Respironics  Dream wear small apparatus.  PLANS/RECOMMENDATIONS:  A follow up appointment will be scheduled in the Sleep Clinic at  [facility].  Please call [phone] with  any questions.  Please share results with Dr Patrice Paradise, Max. And PCP

## 2016-11-19 NOTE — Telephone Encounter (Signed)
Pt called back she is at home, please call her there. Thank you

## 2016-11-20 ENCOUNTER — Other Ambulatory Visit: Payer: PPO

## 2016-11-21 ENCOUNTER — Ambulatory Visit
Admission: RE | Admit: 2016-11-21 | Discharge: 2016-11-21 | Disposition: A | Discharge status: Home / Self Care | Payer: PPO | Source: Ambulatory Visit | Attending: Orthopaedic Surgery | Admitting: Orthopaedic Surgery

## 2016-11-21 DIAGNOSIS — M5126 Other intervertebral disc displacement, lumbar region: Secondary | ICD-10-CM | POA: Diagnosis not present

## 2016-11-21 DIAGNOSIS — M5106 Intervertebral disc disorders with myelopathy, lumbar region: Secondary | ICD-10-CM

## 2016-11-23 ENCOUNTER — Encounter (HOSPITAL_BASED_OUTPATIENT_CLINIC_OR_DEPARTMENT_OTHER): Payer: Self-pay

## 2016-11-23 ENCOUNTER — Ambulatory Visit (INDEPENDENT_AMBULATORY_CARE_PROVIDER_SITE_OTHER): Payer: PPO | Admitting: Family Medicine

## 2016-11-23 ENCOUNTER — Ambulatory Visit (HOSPITAL_BASED_OUTPATIENT_CLINIC_OR_DEPARTMENT_OTHER)
Admission: RE | Admit: 2016-11-23 | Discharge: 2016-11-23 | Disposition: A | Discharge status: Home / Self Care | Payer: PPO | Source: Ambulatory Visit | Attending: Family Medicine | Admitting: Family Medicine

## 2016-11-23 ENCOUNTER — Encounter: Payer: Self-pay | Admitting: Family Medicine

## 2016-11-23 VITALS — BP 122/80 | HR 105 | Ht 60.0 in | Wt 127.0 lb

## 2016-11-23 DIAGNOSIS — E119 Type 2 diabetes mellitus without complications: Secondary | ICD-10-CM | POA: Diagnosis not present

## 2016-11-23 DIAGNOSIS — R350 Frequency of micturition: Secondary | ICD-10-CM | POA: Diagnosis not present

## 2016-11-23 DIAGNOSIS — B353 Tinea pedis: Secondary | ICD-10-CM | POA: Diagnosis not present

## 2016-11-23 DIAGNOSIS — K5730 Diverticulosis of large intestine without perforation or abscess without bleeding: Secondary | ICD-10-CM | POA: Diagnosis not present

## 2016-11-23 DIAGNOSIS — E1151 Type 2 diabetes mellitus with diabetic peripheral angiopathy without gangrene: Secondary | ICD-10-CM | POA: Diagnosis not present

## 2016-11-23 DIAGNOSIS — R1032 Left lower quadrant pain: Secondary | ICD-10-CM | POA: Insufficient documentation

## 2016-11-23 DIAGNOSIS — R1031 Right lower quadrant pain: Secondary | ICD-10-CM

## 2016-11-23 DIAGNOSIS — IMO0002 Reserved for concepts with insufficient information to code with codable children: Secondary | ICD-10-CM

## 2016-11-23 DIAGNOSIS — K5732 Diverticulitis of large intestine without perforation or abscess without bleeding: Secondary | ICD-10-CM | POA: Insufficient documentation

## 2016-11-23 DIAGNOSIS — E1165 Type 2 diabetes mellitus with hyperglycemia: Secondary | ICD-10-CM

## 2016-11-23 DIAGNOSIS — Z794 Long term (current) use of insulin: Secondary | ICD-10-CM

## 2016-11-23 LAB — CBC WITH DIFFERENTIAL/PLATELET
BASOS ABS: 0 10*3/uL (ref 0.0–0.1)
Basophils Relative: 0.2 % (ref 0.0–3.0)
Eosinophils Absolute: 0.1 10*3/uL (ref 0.0–0.7)
Eosinophils Relative: 1.6 % (ref 0.0–5.0)
HEMATOCRIT: 39.2 % (ref 36.0–46.0)
Hemoglobin: 13.1 g/dL (ref 12.0–15.0)
LYMPHS ABS: 1.5 10*3/uL (ref 0.7–4.0)
LYMPHS PCT: 17.2 % (ref 12.0–46.0)
MCHC: 33.4 g/dL (ref 30.0–36.0)
MCV: 90.6 fl (ref 78.0–100.0)
MONOS PCT: 8 % (ref 3.0–12.0)
Monocytes Absolute: 0.7 10*3/uL (ref 0.1–1.0)
NEUTROS ABS: 6.4 10*3/uL (ref 1.4–7.7)
NEUTROS PCT: 73 % (ref 43.0–77.0)
PLATELETS: 195 10*3/uL (ref 150.0–400.0)
RBC: 4.32 Mil/uL (ref 3.87–5.11)
RDW: 12.7 % (ref 11.5–15.5)
WBC: 8.7 10*3/uL (ref 4.0–10.5)

## 2016-11-23 LAB — COMPREHENSIVE METABOLIC PANEL
ALK PHOS: 81 U/L (ref 39–117)
ALT: 15 U/L (ref 0–35)
AST: 16 U/L (ref 0–37)
Albumin: 3.4 g/dL — ABNORMAL LOW (ref 3.5–5.2)
BILIRUBIN TOTAL: 0.4 mg/dL (ref 0.2–1.2)
BUN: 17 mg/dL (ref 6–23)
CALCIUM: 9.4 mg/dL (ref 8.4–10.5)
CO2: 29 meq/L (ref 19–32)
Chloride: 98 mEq/L (ref 96–112)
Creatinine, Ser: 0.6 mg/dL (ref 0.40–1.20)
GFR: 106.18 mL/min (ref >60.00)
GLUCOSE: 344 mg/dL — AB (ref 70–99)
POTASSIUM: 4.1 meq/L (ref 3.5–5.1)
Sodium: 136 mEq/L (ref 135–145)
Total Protein: 7.4 g/dL (ref 6.0–8.3)

## 2016-11-23 LAB — POC URINALSYSI DIPSTICK (AUTOMATED)
BILIRUBIN UA: NEGATIVE
Blood, UA: NEGATIVE
KETONES UA: NEGATIVE
LEUKOCYTES UA: NEGATIVE
NITRITE UA: NEGATIVE
Protein, UA: NEGATIVE
Spec Grav, UA: 1.02 (ref 1.010–1.025)
Urobilinogen, UA: 0.2 E.U./dL
pH, UA: 6 (ref 5.0–8.0)

## 2016-11-23 MED ORDER — IOPAMIDOL (ISOVUE-300) INJECTION 61%
100.0000 mL | Freq: Once | INTRAVENOUS | Status: AC | PRN
  Administered 2016-11-23: 100 mL via INTRAVENOUS

## 2016-11-23 MED ORDER — AMOXICILLIN-POT CLAVULANATE 875-125 MG PO TABS: 1.0000 | ORAL_TABLET | Freq: Two times a day (BID) | ORAL | 0 refills | Status: DC

## 2016-11-23 MED ORDER — INSULIN ASPART 100 UNIT/ML ~~LOC~~ SOLN: SUBCUTANEOUS | 99 refills | Status: DC

## 2016-11-23 MED ORDER — CLOTRIMAZOLE-BETAMETHASONE 1-0.05 % EX CREA: 1.0000 "application " | TOPICAL_CREAM | Freq: Two times a day (BID) | CUTANEOUS | 0 refills | Status: DC

## 2016-11-23 MED ORDER — INSULIN GLARGINE 100 UNIT/ML SOLOSTAR PEN: PEN_INJECTOR | SUBCUTANEOUS | 11 refills | Status: DC

## 2016-11-23 NOTE — Progress Notes (Signed)
Patient ID: [name], female    DOB: 09/23/1950  Age: 66 y.o. MRN: [id number]    Subjective:  Subjective  HPI [name] presents for low abd pain and urinary frequency x 1 week.    She is seeing Dr Patrice Paradise from neurosurgery and Mri back was done Saturday.    Review of Systems  Constitutional: Negative for appetite change, diaphoresis, fatigue and unexpected weight change.  Eyes: Negative for pain, redness and visual disturbance.  Respiratory: Negative for cough, chest tightness, shortness of breath and wheezing.   Cardiovascular: Negative for chest pain, palpitations and leg swelling.  Gastrointestinal: Positive for abdominal pain.  Endocrine: Negative for cold intolerance, heat intolerance, polydipsia, polyphagia and polyuria.  Genitourinary: Positive for dysuria and frequency. Negative for difficulty urinating.  Musculoskeletal: Positive for back pain.  Neurological: Negative for dizziness, light-headedness, numbness and headaches.    History Past Medical History:  Diagnosis Date  . Arthritis   . Diabetes mellitus   . Thyroid disease     She has a past surgical history that includes Cholecystectomy; Abdominal hysterectomy; Back surgery; Tonsilectomy, adenoidectomy, bilateral myringotomy and tubes; and Thyroid surgery.   Her family history includes Alcohol abuse in her mother; Breast cancer in her cousin and daughter; Heart disease in her maternal grandfather; Stroke in her maternal grandmother and mother.She reports that she has been smoking.  She has smoked for the past 48.00 years. She has never used smokeless tobacco. She reports that she does not drink alcohol or use drugs.  Current Outpatient Prescriptions on File Prior to Visit  Medication Sig Dispense Refill  . ALPRAZolam (XANAX) 0.5 MG tablet TAKE ONE TABLET BY MOUTH THREE TIMES A DAY AS NEEDED FOR SLEEP OR ANXIETY 30 tablet 0  . Blood Glucose Monitoring Suppl (ONE TOUCH ULTRA 2) w/Device KIT Check blood sugar as  directed. Dx:E11.65 1 each 0  . glimepiride (AMARYL) 4 MG tablet TAKE ONE TABLET BY MOUTH DAILY WITH BREAKFAST 30 tablet 3  . glucose blood (ONE TOUCH ULTRA TEST) test strip Check blood sugar 4 times per day.E11.65 400 each 2  . hydrochlorothiazide (HYDRODIURIL) 25 MG tablet Take 1 tablet (25 mg total) by mouth daily. 90 tablet 1  . hydrochlorothiazide (HYDRODIURIL) 25 MG tablet TAKE ONE TABLET BY MOUTH DAILY 90 tablet 1  . Insulin Pen Needle (NOVOTWIST) 32G X 5 MM MISC Use daily at Bedtime with Lantus 100 each 0  . levocetirizine (XYZAL) 5 MG tablet Take 1 tablet (5 mg total) by mouth every evening. 30 tablet 5  . levothyroxine (SYNTHROID, LEVOTHROID) 125 MCG tablet Take 1 tablet (125 mcg total) by mouth daily before breakfast. 30 tablet 3  . pantoprazole (PROTONIX) 40 MG tablet Take 1 tablet (40 mg total) by mouth daily. 30 tablet 11  . rOPINIRole (REQUIP) 0.25 MG tablet Take 1 by mouth at night for 2 days, then increase to 2 at night 60 tablet 0  . traMADol (ULTRAM) 50 MG tablet Take 1 tablet (50 mg total) by mouth every 8 (eight) hours as needed for pain. 30 tablet 0   Current Facility-Administered Medications on File Prior to Visit  Medication Dose Route Frequency Provider Last Rate Last Dose  . ipratropium-albuterol (DUONEB) 0.5-2.5 (3) MG/3ML nebulizer solution 3 mL  3 mL Nebulization Q6H Lowne Chase, Kendrick Fries R, DO         Objective:  Objective  Physical Exam  Abdominal: Soft. She exhibits no distension. There is tenderness in the left lower quadrant. There is no  rigidity, no rebound, no guarding and no CVA tenderness.    Genitourinary: Pelvic exam was performed with patient supine. There is no rash, tenderness, lesion or injury on the right labia. There is no rash, tenderness, lesion or injury on the left labia. No erythema or tenderness in the vagina. No vaginal discharge found.  Psychiatric: She has a normal mood and affect. Her behavior is normal. Judgment and thought content  normal.  Nursing note and vitals reviewed.  BP 122/80   Pulse (!) 105   Ht 5' (1.524 m)   Wt 127 lb (57.6 kg)   SpO2 96%   BMI 24.80 kg/m  Wt Readings from Last 3 Encounters:  11/23/16 127 lb (57.6 kg)  09/11/16 126 lb 9.6 oz (57.4 kg)  04/03/16 127 lb 9.6 oz (57.9 kg)     Lab Results  Component Value Date   WBC 8.7 11/23/2016   HGB 13.1 11/23/2016   HCT 39.2 11/23/2016   PLT 195.0 11/23/2016   GLUCOSE 344 (H) 11/23/2016   CHOL 211 (H) 09/11/2016   TRIG 95.0 09/11/2016   HDL 49.90 09/11/2016   LDLDIRECT 162.0 03/28/2015   LDLCALC 142 (H) 09/11/2016   ALT 15 11/23/2016   AST 16 11/23/2016   NA 136 11/23/2016   K 4.1 11/23/2016   CL 98 11/23/2016   CREATININE 0.60 11/23/2016   BUN 17 11/23/2016   CO2 29 11/23/2016   TSH 0.33 (L) 09/11/2016   HGBA1C 11.4 (H) 09/11/2016   MICROALBUR 1.5 03/28/2015    Mr Lumbar Spine Wo Contrast  Result Date: 11/21/2016 CLINICAL DATA:  Worsening back pain with some radiation to the right hip. EXAM: MRI LUMBAR SPINE WITHOUT CONTRAST TECHNIQUE: Multiplanar, multisequence MR imaging of the lumbar spine was performed. No intravenous contrast was administered. COMPARISON:  MRI 12/29/2005 FINDINGS: Segmentation:  5 lumbar type vertebral bodies. Alignment:  Normal Vertebrae:  Previous discectomy and fusion L5-S1. Conus medullaris: Extends to the L2-3 level and appears normal. Paraspinal and other soft tissues: Right renal cyst. Disc levels: T12-L1: Shallow central right-sided disc bulge but no neural compression. L1-2: Shallow right paracentral disc bulge but no neural compression. L2-3:  Normal interspace. L3-4:  Normal interspace. L4-5: Desiccation and mild bulging of the disc. Mild facet degeneration and hypertrophy. Mild narrowing of the lateral recesses and foramina but without visible neural compression. L5-S1: Distant discectomy and fusion. Fusion is solid. No complicating feature. IMPRESSION: Good appearance at the fusion level of L5-S1. IJ  segment degenerative disease at L4-5. Bulging of the disc. Bilateral facet osteoarthritis. Mild narrowing of the lateral recesses and foramina left more than right but no visible neural compression. The patient could possibly have referred facet syndrome pain. Electronically Signed   By: Nelson Chimes M.D.   On: 11/21/2016 20:59     Assessment & Plan:  Plan  I have discontinued [name]'s CELEBREX, HYDROcodone-acetaminophen, mometasone, fluticasone, and albuterol. I have also changed her Insulin Glargine. Additionally, I am having her start on clotrimazole-betamethasone, amoxicillin-clavulanate, and insulin aspart. Lastly, I am having her maintain her Insulin Pen Needle, traMADol, pantoprazole, levocetirizine, glucose blood, ONE TOUCH ULTRA 2, hydrochlorothiazide, hydrochlorothiazide, rOPINIRole, levothyroxine, ALPRAZolam, glimepiride, and FLUoxetine. We will continue to administer ipratropium-albuterol.  Meds ordered this encounter  Medications  . FLUoxetine (PROZAC) 20 MG capsule    Sig: Take 20 mg by mouth daily.  . Insulin Glargine (LANTUS SOLOSTAR) 100 UNIT/ML Solostar Pen    Sig: Inject 30 units into the skin at bedtime    Dispense:  15 mL    Refill:  11    This is a new dose based on recent labs  . clotrimazole-betamethasone (LOTRISONE) cream    Sig: Apply 1 application topically 2 (two) times daily.    Dispense:  30 g    Refill:  0  . amoxicillin-clavulanate (AUGMENTIN) 875-125 MG tablet    Sig: Take 1 tablet by mouth 2 (two) times daily.    Dispense:  20 tablet    Refill:  0  . insulin aspart (NOVOLOG) 100 UNIT/ML injection    Sig: 10 u bid before breakfast and dinner    Dispense:  3 vial    Refill:  PRN    Problem List Items Addressed This Visit      Unprioritized   Abdominal pain    LLQ pain R/o diverticulitis abx perorders      Relevant Orders   CT Abdomen Pelvis W Contrast   CBC with Differential/Platelet (Completed)   Comprehensive metabolic panel (Completed)    Urine Culture   DM (diabetes mellitus) type II uncontrolled, periph vascular disorder (HCC)   Relevant Medications   Insulin Glargine (LANTUS SOLOSTAR) 100 UNIT/ML Solostar Pen   insulin aspart (NOVOLOG) 100 UNIT/ML injection   Other Relevant Orders   Ambulatory referral to Endocrinology   Urine Culture   Left lower quadrant pain   Relevant Medications   amoxicillin-clavulanate (AUGMENTIN) 875-125 MG tablet   Other Relevant Orders   CT Abdomen Pelvis W Contrast   CBC with Differential/Platelet (Completed)   Comprehensive metabolic panel (Completed)   Urine Culture   Urinary frequency - Primary    + glucose in urine Refer to endo Pt has been reluctant to go but is willing now Added reg insulin bid       Relevant Orders   POCT Urinalysis Dipstick (Automated) (Completed)   Urine Culture    Other Visit Diagnoses    Diabetes mellitus, type II, insulin dependent (HCC)       Relevant Medications   Insulin Glargine (LANTUS SOLOSTAR) 100 UNIT/ML Solostar Pen   insulin aspart (NOVOLOG) 100 UNIT/ML injection   Other Relevant Orders   Urine Culture   Tinea pedis of right foot       Relevant Medications   clotrimazole-betamethasone (LOTRISONE) cream      Follow-up: Return if symptoms worsen or fail to improve, for as scheduled in sept.  [name], DO

## 2016-11-23 NOTE — Assessment & Plan Note (Signed)
LLQ pain R/o diverticulitis abx perorders

## 2016-11-23 NOTE — Assessment & Plan Note (Signed)
+   glucose in urine Refer to endo Pt has been reluctant to go but is willing now Added reg insulin bid

## 2016-11-23 NOTE — Patient Instructions (Signed)

## 2016-11-24 ENCOUNTER — Telehealth: Payer: Self-pay | Admitting: Family Medicine

## 2016-11-24 DIAGNOSIS — K5792 Diverticulitis of intestine, part unspecified, without perforation or abscess without bleeding: Secondary | ICD-10-CM

## 2016-11-24 LAB — URINE CULTURE: Organism ID, Bacteria: NO GROWTH

## 2016-11-24 NOTE — Telephone Encounter (Signed)
Pt called in for lab results.    CB: [phone]

## 2016-11-25 ENCOUNTER — Telehealth: Payer: Self-pay | Admitting: Family Medicine

## 2016-11-25 ENCOUNTER — Other Ambulatory Visit: Payer: Self-pay | Admitting: Family Medicine

## 2016-11-25 NOTE — Telephone Encounter (Signed)
Lab results reviewed with patient. She stated understanding and agreed with plan.  During call, she verbalized that she would like a GI referral for diverticulitis.  Referral placed.

## 2016-11-25 NOTE — Telephone Encounter (Signed)
Noted  Referral placed.

## 2016-11-25 NOTE — Telephone Encounter (Signed)
Caller name: Relation to DV:OUZH Call back number:[phone] Pharmacy:  Reason for call: pt states Dr. Etter Sjogren informed her to let her know if she wanted a referral to the GI doctor, pt would like to get the referral place, pt has healthteam advantage for insurance.

## 2016-11-25 NOTE — Telephone Encounter (Signed)
Out of Office [facility]

## 2016-11-26 ENCOUNTER — Telehealth: Payer: Self-pay | Admitting: Internal Medicine

## 2016-11-26 NOTE — Telephone Encounter (Signed)
yes

## 2016-11-27 ENCOUNTER — Telehealth: Payer: Self-pay | Admitting: Family Medicine

## 2016-11-27 ENCOUNTER — Encounter: Payer: Self-pay | Admitting: Internal Medicine

## 2016-11-27 NOTE — Telephone Encounter (Signed)
Patient calling to get lab results,  [phone]

## 2016-11-27 NOTE — Telephone Encounter (Signed)
Appointment scheduled for 02/01/17

## 2016-11-27 NOTE — Telephone Encounter (Signed)
Spoke with Patient informed:Cholesterol good Recheck 3 months  Lipid, cmp, ** Patient had lab appoint scheduled for 9/10 but we rescheduled for DEC [place] be out of [place] on  a cruise

## 2016-11-27 NOTE — Telephone Encounter (Signed)
Faxed HCTZ 90d/thx dmf

## 2016-11-30 ENCOUNTER — Telehealth: Payer: Self-pay | Admitting: Pediatrics

## 2016-11-30 MED ORDER — INSULIN ASPART 100 UNIT/ML FLEXPEN: 10.0000 [IU] | PEN_INJECTOR | Freq: Two times a day (BID) | SUBCUTANEOUS | 11 refills | Status: DC

## 2016-11-30 NOTE — Telephone Encounter (Signed)
Per pharmacy fax: Pt requesting to resume Novolog Flex Pen.  It appears rx was sent for vial. Please advise.

## 2016-11-30 NOTE — Telephone Encounter (Signed)
Rx sent to pharmacy   

## 2016-11-30 NOTE — Telephone Encounter (Signed)
That is fine---switch to pen

## 2016-12-03 DIAGNOSIS — M5106 Intervertebral disc disorders with myelopathy, lumbar region: Secondary | ICD-10-CM | POA: Diagnosis not present

## 2016-12-03 DIAGNOSIS — M545 Low back pain: Secondary | ICD-10-CM | POA: Diagnosis not present

## 2016-12-09 ENCOUNTER — Other Ambulatory Visit: Payer: Self-pay | Admitting: Family Medicine

## 2016-12-14 ENCOUNTER — Other Ambulatory Visit: Payer: PPO

## 2016-12-18 ENCOUNTER — Telehealth: Payer: Self-pay | Admitting: *Deleted

## 2016-12-18 DIAGNOSIS — G4733 Obstructive sleep apnea (adult) (pediatric): Secondary | ICD-10-CM | POA: Diagnosis not present

## 2016-12-18 NOTE — Telephone Encounter (Signed)
Received Medical records from [facility], also, request to approve and/or deny Medical Clearance for Cervical Spine Sx [last OV 11/23/16; Last EKG]; forwarded to provider/SLS 09/14

## 2016-12-20 ENCOUNTER — Other Ambulatory Visit: Payer: Self-pay | Admitting: Family Medicine

## 2016-12-20 DIAGNOSIS — Z794 Long term (current) use of insulin: Principal | ICD-10-CM

## 2016-12-20 DIAGNOSIS — E119 Type 2 diabetes mellitus without complications: Secondary | ICD-10-CM

## 2016-12-21 ENCOUNTER — Telehealth: Payer: Self-pay | Admitting: Family Medicine

## 2016-12-21 DIAGNOSIS — E119 Type 2 diabetes mellitus without complications: Secondary | ICD-10-CM

## 2016-12-21 DIAGNOSIS — Z794 Long term (current) use of insulin: Principal | ICD-10-CM

## 2016-12-21 MED ORDER — GLIMEPIRIDE 4 MG PO TABS: 4.0000 mg | ORAL_TABLET | Freq: Every day | ORAL | 1 refills | Status: DC

## 2016-12-21 NOTE — Telephone Encounter (Signed)
Refills sent.  Pt was notified, she had no additional questions at this time. Nothing further is needed

## 2016-12-21 NOTE — Telephone Encounter (Signed)
Pt has been scheduled.  °

## 2016-12-21 NOTE — Telephone Encounter (Signed)
°  Relation to ME:BRAX Call back number:[phone] Pharmacy: [facility] at [place], [place] - [address] [phone] (Phone) [phone] (Fax)     Reason for call:  Pharmacy requesting new Rx indicating 90 day supply for glimepiride (AMARYL) 4 MG tablet stating patient would like all medications filled at the same time within a 90 day supply, please advise

## 2016-12-21 NOTE — Telephone Encounter (Signed)
Per Dr. Carollee Herter, patient will need Surgical Clearance; please call patient and have her schedule SX Clearance appointment [for [facility]], Thanks/SLS 09/17

## 2016-12-22 ENCOUNTER — Other Ambulatory Visit: Payer: Self-pay | Admitting: Family Medicine

## 2016-12-22 NOTE — Telephone Encounter (Signed)
Requesting: Alprazolam Contract: None UDS: None Last OV: 8.20.18 Next OV: 11.05.18 & 12.31.18 Last Refill: 6.27.18   Please advise

## 2016-12-23 ENCOUNTER — Other Ambulatory Visit: Payer: Self-pay | Admitting: Family Medicine

## 2016-12-25 NOTE — Telephone Encounter (Signed)
Disregard note below

## 2016-12-25 NOTE — Telephone Encounter (Signed)
Rx was signed and faxed to pharmacy. Received successful confirmation

## 2016-12-25 NOTE — Telephone Encounter (Signed)
Pt scheduled for 02/08/17, has CPE at 9:30am and OV for Medical Clearance at 11:30am; forwarded to provider on 12/23/16/SLS

## 2016-12-25 NOTE — Telephone Encounter (Signed)
Spoke with patient he has enough medication for the weekend. He will pick up on Monday.   Pc

## 2017-01-04 ENCOUNTER — Ambulatory Visit: Payer: PPO | Admitting: Neurology

## 2017-01-12 ENCOUNTER — Other Ambulatory Visit: Payer: Self-pay | Admitting: Family Medicine

## 2017-01-12 DIAGNOSIS — G4733 Obstructive sleep apnea (adult) (pediatric): Secondary | ICD-10-CM | POA: Diagnosis not present

## 2017-01-14 NOTE — Telephone Encounter (Signed)
[facility] called to check on the status of this refill. Please call and advise.

## 2017-01-17 DIAGNOSIS — G4733 Obstructive sleep apnea (adult) (pediatric): Secondary | ICD-10-CM | POA: Diagnosis not present

## 2017-01-24 ENCOUNTER — Other Ambulatory Visit: Payer: Self-pay | Admitting: Family Medicine

## 2017-01-24 DIAGNOSIS — E119 Type 2 diabetes mellitus without complications: Secondary | ICD-10-CM

## 2017-01-24 DIAGNOSIS — Z794 Long term (current) use of insulin: Principal | ICD-10-CM

## 2017-01-27 ENCOUNTER — Telehealth: Payer: Self-pay | Admitting: Neurology

## 2017-01-27 NOTE — Telephone Encounter (Signed)
I called pt. She reports starting her cpap on 12/18/16 and needs a sooner cpap follow up appt that 11/26 because she will be having back surgery. Pt is agreeable to seeing Hoyle Sauer, NP on 01/28/17 at 2:45pm. Pt verbalized understanding to bring cpap and of new appt date and time.

## 2017-01-27 NOTE — Telephone Encounter (Signed)
Patient has appointment on 03-01-17 for initial CPAP. She will need to change appointment because she is having back surgery on that day. Can she be scheduled before this date? Dr. Brett Fairy is booking into January and that would be too late for her insurance to pay.

## 2017-01-28 ENCOUNTER — Ambulatory Visit (INDEPENDENT_AMBULATORY_CARE_PROVIDER_SITE_OTHER): Payer: PPO | Admitting: Nurse Practitioner

## 2017-01-28 ENCOUNTER — Encounter: Payer: Self-pay | Admitting: Nurse Practitioner

## 2017-01-28 DIAGNOSIS — Z9989 Dependence on other enabling machines and devices: Secondary | ICD-10-CM

## 2017-01-28 DIAGNOSIS — G4733 Obstructive sleep apnea (adult) (pediatric): Secondary | ICD-10-CM | POA: Diagnosis not present

## 2017-01-28 NOTE — Patient Instructions (Signed)
CPAP compliance 100%  continue same settings  follow-up in 6 months

## 2017-01-28 NOTE — Progress Notes (Signed)
[facility]  PATIENT: [name] DOB: [birth year]   REASON FOR VISIT:  Follow-up for  Obstructive sleep apnea,  First CPAP compliance HISTORY FROM:patient    HISTORY OF PRESENT ILLNESS:UPDATE  10/25/2018CM  Ms. 2, 66 year old female returns for follow-up.  Sleep study results found obstructive sleep apnea and she was started on CPAP with [facility] on 12/18/2016.  Patient requested a sooner appointment because of pending  back surgery.  CPAP compliance data dated 12/29/2016- 01/27/2017 shows 100%  compliance greater than 4 hours  For 30 days.  Average usage 7 hours 55  Minutes. Set pressure 9 cm. EPR 3. AHI 3.9 She claims she feels so much better, less fatigue. ESS 2. FSS 9. She returns for reevaluation.  10/12/16 CDI have pleasure of seeing Mrs. Mccoy today again, on 10/12/2016 I had previously seen her in 2017 and she was scheduled for a sleep study. A polysomnography was performed on 08/09/2015. Introducing the patient as a female with a BMI of 25, an Epworth sleepiness score of 18 points, with arthritis, diabetes, thyroid disease and status post UPPP. The patient's AHI was only 5.1 very low but exacerbated during REM sleep to 27.1 the patient also had some prolonged oxygen desaturations 107 minutes between or at 89% S/P OT. She had mild periodic limb movements. The impression was that she did not have significant apnea but hypoxemia. 4 snoring therapy I suggested an oral appliance and the patient was referred to Dr. caps were then confirmed that because of her UPPP she was not a candidate for a dental device. He performed a home sleep test on 08/24/2016, which revealed only 26 minutes of at or below 88% oxygen saturation, a respiratory disturbance index of 11 per hour, and a pulse variation between 40 and 132 bpm. This again confirms have mild apnea but no longer hypoxemia to be present the RDI was 33.8 still indicating that this patient snores. In spite of the UPPP the only  treatment option that would treat snoring in a patient after UPPP would be use of his CPAP. She is no longer as sleepy as she was last year with an Epworth sleepiness score at 4 and the fatigue severity of only 9 points and she is not depressed. Since she cannot have a dental device I would now try to perform a CPAP titration.      REVIEW OF SYSTEMS: Full 14 system review of systems performed and notable only for those listed, all others are neg:  Constitutional: neg  Cardiovascular: neg Ear/Nose/Throat: neg  Skin: neg Eyes: neg Respiratory: neg Gastroitestinal: neg  Hematology/Lymphatic: neg  Endocrine: neg Musculoskeletal: of back pain pending surgery Allergy/Immunology: neg Neurological: neg Psychiatric: neg Sleep : obstructive sleep apnea with CPAP   ALLERGIES: No Known Allergies  HOME MEDICATIONS: Outpatient Medications Prior to Visit  Medication Sig Dispense Refill  . ALPRAZolam (XANAX) 0.5 MG tablet TAKE ONE TABLET BY MOUTH THREE TIMES A DAY AS NEEDED FOR ANXIETY OR SLEEP 30 tablet 0  . Blood Glucose Monitoring Suppl (ONE TOUCH ULTRA 2) w/Device KIT Check blood sugar as directed. Dx:E11.65 1 each 0  . clotrimazole-betamethasone (LOTRISONE) cream Apply 1 application topically 2 (two) times daily. 30 g 0  . FLUoxetine (PROZAC) 20 MG capsule Take 20 mg by mouth daily.    Marland Kitchen glimepiride (AMARYL) 4 MG tablet Take 1 tablet (4 mg total) by mouth daily with breakfast. 90 tablet 1  . glucose blood (ONE TOUCH ULTRA TEST) test strip Check blood  sugar 4 times per day.E11.65 400 each 2  . hydrochlorothiazide (HYDRODIURIL) 25 MG tablet TAKE ONE TABLET BY MOUTH DAILY 90 tablet 0  . insulin aspart (NOVOLOG FLEXPEN) 100 UNIT/ML FlexPen Inject 10 Units into the skin 2 (two) times daily. Before breakfast and before dinner 15 mL 11  . Insulin Pen Needle (NOVOTWIST) 32G X 5 MM MISC Use daily at Bedtime with Lantus 100 each 0  . LANTUS SOLOSTAR 100 UNIT/ML Solostar Pen INJECT 25 UNITS INTO  THE SKIN AT BEDTIME 15 pen 10  . levocetirizine (XYZAL) 5 MG tablet Take 1 tablet (5 mg total) by mouth every evening. 30 tablet 5  . levothyroxine (SYNTHROID, LEVOTHROID) 125 MCG tablet TAKE ONE TABLET BY MOUTH DAILY BEFORE BREAKFAST 90 tablet 0  . pantoprazole (PROTONIX) 40 MG tablet Take 1 tablet (40 mg total) by mouth daily. 30 tablet 11  . rOPINIRole (REQUIP) 0.25 MG tablet Take 1 by mouth at night for 2 days, then increase to 2 at night 60 tablet 0  . traMADol (ULTRAM) 50 MG tablet Take 1 tablet (50 mg total) by mouth every 8 (eight) hours as needed for pain. 30 tablet 0  . amoxicillin-clavulanate (AUGMENTIN) 875-125 MG tablet Take 1 tablet by mouth 2 (two) times daily. (Patient not taking: Reported on 01/28/2017) 20 tablet 0  . hydrochlorothiazide (HYDRODIURIL) 25 MG tablet Take 1 tablet (25 mg total) by mouth daily. 90 tablet 1  . levothyroxine (SYNTHROID, LEVOTHROID) 125 MCG tablet TAKE ONE TABLET BY MOUTH DAILY BEFORE BREAKFAST 90 tablet 0   Facility-Administered Medications Prior to Visit  Medication Dose Route Frequency Provider Last Rate Last Dose  . ipratropium-albuterol (DUONEB) 0.5-2.5 (3) MG/3ML nebulizer solution 3 mL  3 mL Nebulization Q6H Lowne Koren Shiver, DO        PAST MEDICAL HISTORY: Past Medical History:  Diagnosis Date  . Arthritis   . Diabetes mellitus   . Thyroid disease     PAST SURGICAL HISTORY: Past Surgical History:  Procedure Laterality Date  . ABDOMINAL HYSTERECTOMY    . BACK SURGERY     x3  . CHOLECYSTECTOMY    . THYROID SURGERY    . TONSILECTOMY, ADENOIDECTOMY, BILATERAL MYRINGOTOMY AND TUBES      FAMILY HISTORY: Family History  Problem Relation Age of Onset  . Stroke Mother   . Alcohol abuse Mother   . Stroke Maternal Grandmother   . Heart disease Maternal Grandfather   . Breast cancer Cousin   . Breast cancer Daughter     SOCIAL HISTORY: Social History   Social History  . Marital status: Married    Spouse name: N/A  .  Number of children: N/A  . Years of education: N/A   Occupational History  . [employer]   Social History Main Topics  . Smoking status: Current Some Day Smoker    Years: 48.00    Last attempt to quit: 12/03/2014  . Smokeless tobacco: Never Used  . Alcohol use No  . Drug use: No  . Sexual activity: Yes    Partners: Male     Comment: 2 -3 days a week   1 pack lasts 2 weeks   Other Topics Concern  . Not on file   Social History Narrative   Exercise--  3 flts steps a day for 8 hours     PHYSICAL EXAM  Vitals:   01/28/17 1447  BP: 121/74  Pulse: 82  Weight: 127 lb 3.2 oz (57.7 kg)  Height:  _0  (1.575 m)   Body mass index is 23.27 kg/m.  Generalized: Well developed, in no acute distress  Head: normocephalic and atraumatic,. Oropharynx benign  Neck: Supple,  Musculoskeletal: No deformity   Neurological examination   Mentation: Alert oriented to time, place, history taking. Attention span and concentration appropriate. Recent and remote memory intact.  Follows all commands speech and language fluent.ESS 2. FSS 9   Cranial nerve II-XII: Pupils were equal round reactive to light extraocular movements were full, visual field were full on confrontational test. Facial sensation and strength were normal. hearing was intact to finger rubbing bilaterally. Uvula tongue midline. head turning and shoulder shrug were normal and symmetric.Tongue protrusion into cheek strength was normal. Motor: normal bulk and tone, full strength in the BUE, BLE,  Sensory: normal and symmetric to light touch,  Coordination: finger-nose-finger, , no dysmetria Reflexes: symmetric upper and lower, plantar responses were flexor bilaterally. Gait and Station: Rising up from seated position without assistance, normal stance,   DIAGNOSTIC DATA (LABS, IMAGING, TESTING) - I reviewed patient records, labs, notes, testing and imaging myself where available.  Lab Results  Component  Value Date   WBC 8.7 11/23/2016   HGB 13.1 11/23/2016   HCT 39.2 11/23/2016   MCV 90.6 11/23/2016   PLT 195.0 11/23/2016      Component Value Date/Time   NA 136 11/23/2016 0950   K 4.1 11/23/2016 0950   CL 98 11/23/2016 0950   CO2 29 11/23/2016 0950   GLUCOSE 344 (H) 11/23/2016 0950   BUN 17 11/23/2016 0950   CREATININE 0.60 11/23/2016 0950   CALCIUM 9.4 11/23/2016 0950   PROT 7.4 11/23/2016 0950   ALBUMIN 3.4 (L) 11/23/2016 0950   AST 16 11/23/2016 0950   ALT 15 11/23/2016 0950   ALKPHOS 81 11/23/2016 0950   BILITOT 0.4 11/23/2016 0950   GFRNONAA >60 10/02/2009 1105   GFRAA  10/02/2009 1105    >60        The eGFR has been calculated using the MDRD equation. This calculation has not been validated in all clinical situations. eGFR's persistently <60 mL/min signify possible Chronic Kidney Disease.   Lab Results  Component Value Date   CHOL 211 (H) 09/11/2016   HDL 49.90 09/11/2016   LDLCALC 142 (H) 09/11/2016   LDLDIRECT 162.0 03/28/2015   TRIG 95.0 09/11/2016   CHOLHDL 4 09/11/2016   Lab Results  Component Value Date   HGBA1C 11.4 (H) 09/11/2016   No results found for: [id number] Lab Results  Component Value Date   TSH 0.33 (L) 09/11/2016      ASSESSMENT AND PLAN  66 y.o. year old female  has a past medical history of Arthritis; Diabetes mellitus; and Thyroid disease. And obstructive sleep apnea. Here to follow-up for first CPAP compliance. CPAP compliance data dated 12/29/2016- 01/27/2017 shows 100%  compliance greater than 4 hours  For 30 days.  Average usage 7 hours 55  Minutes. Set pressure 9 cm. EPR 3. AHI 3.9 She claims she feels so much better, less fatigue. ESS 2. FSS 9   CPAP compliance 100%  Reviewed data with patient  continue same settings  follow-up in 6 months I spent 15 min  in total face to face time with the patient more than 50% of which was spent counseling and coordination of care, reviewing test results reviewing medications and  discussing and reviewing the diagnosis of obstructive sleep apnea and CPAP compliance, Dennie Bible, [facility], [facility], [facility]  [address], [place], [place] [address] [phone]

## 2017-01-29 ENCOUNTER — Ambulatory Visit (INDEPENDENT_AMBULATORY_CARE_PROVIDER_SITE_OTHER): Payer: PPO | Admitting: Behavioral Health

## 2017-01-29 DIAGNOSIS — Z23 Encounter for immunization: Secondary | ICD-10-CM | POA: Diagnosis not present

## 2017-01-29 NOTE — Progress Notes (Signed)
I agree with the assessment and plan as directed by NP .The patient reports good CPAP results, benefits from Therapy. She is known to me .   Chanoch Mccleery, MD

## 2017-01-29 NOTE — Progress Notes (Signed)
Pre visit review using our clinic review tool, if applicable. No additional management support is needed unless otherwise documented below in the visit note.  Patient came in clinic for influenza vaccination. IM injection was given in the left deltoid. Patient tolerated the injection well.

## 2017-02-01 ENCOUNTER — Ambulatory Visit: Payer: PPO | Admitting: Internal Medicine

## 2017-02-05 DIAGNOSIS — Z79891 Long term (current) use of opiate analgesic: Secondary | ICD-10-CM | POA: Diagnosis not present

## 2017-02-05 DIAGNOSIS — M545 Low back pain: Secondary | ICD-10-CM | POA: Diagnosis not present

## 2017-02-05 DIAGNOSIS — M5106 Intervertebral disc disorders with myelopathy, lumbar region: Secondary | ICD-10-CM | POA: Diagnosis not present

## 2017-02-05 DIAGNOSIS — Z79899 Other long term (current) drug therapy: Secondary | ICD-10-CM | POA: Diagnosis not present

## 2017-02-05 DIAGNOSIS — M4716 Other spondylosis with myelopathy, lumbar region: Secondary | ICD-10-CM | POA: Diagnosis not present

## 2017-02-05 DIAGNOSIS — M960 Pseudarthrosis after fusion or arthrodesis: Secondary | ICD-10-CM | POA: Diagnosis not present

## 2017-02-05 DIAGNOSIS — G894 Chronic pain syndrome: Secondary | ICD-10-CM | POA: Diagnosis not present

## 2017-02-08 ENCOUNTER — Ambulatory Visit: Payer: PPO | Admitting: Family Medicine

## 2017-02-08 ENCOUNTER — Encounter: Payer: Self-pay | Admitting: Family Medicine

## 2017-02-08 VITALS — BP 122/78 | Temp 98.1°F | Ht 59.0 in | Wt 127.0 lb

## 2017-02-08 DIAGNOSIS — E039 Hypothyroidism, unspecified: Secondary | ICD-10-CM

## 2017-02-08 DIAGNOSIS — Z23 Encounter for immunization: Secondary | ICD-10-CM

## 2017-02-08 DIAGNOSIS — Z0000 Encounter for general adult medical examination without abnormal findings: Secondary | ICD-10-CM

## 2017-02-08 DIAGNOSIS — E785 Hyperlipidemia, unspecified: Secondary | ICD-10-CM

## 2017-02-08 DIAGNOSIS — E1151 Type 2 diabetes mellitus with diabetic peripheral angiopathy without gangrene: Secondary | ICD-10-CM | POA: Diagnosis not present

## 2017-02-08 DIAGNOSIS — Z0289 Encounter for other administrative examinations: Secondary | ICD-10-CM

## 2017-02-08 DIAGNOSIS — Z01818 Encounter for other preprocedural examination: Secondary | ICD-10-CM | POA: Diagnosis not present

## 2017-02-08 DIAGNOSIS — IMO0002 Reserved for concepts with insufficient information to code with codable children: Secondary | ICD-10-CM

## 2017-02-08 DIAGNOSIS — E1165 Type 2 diabetes mellitus with hyperglycemia: Secondary | ICD-10-CM

## 2017-02-08 LAB — MICROALBUMIN / CREATININE URINE RATIO
CREATININE, U: 98.6 mg/dL
MICROALB/CREAT RATIO: 0.8 mg/g (ref 0.0–30.0)
Microalb, Ur: 0.8 mg/dL (ref 0.0–1.9)

## 2017-02-08 LAB — LIPID PANEL
CHOL/HDL RATIO: 4
Cholesterol: 151 mg/dL (ref 0–200)
HDL: 42.5 mg/dL (ref >39.00)
LDL Cholesterol: 80 mg/dL (ref 0–99)
NONHDL: 108.31
Triglycerides: 144 mg/dL (ref 0.0–149.0)
VLDL: 28.8 mg/dL (ref 0.0–40.0)

## 2017-02-08 LAB — CBC WITH DIFFERENTIAL/PLATELET
BASOS ABS: 0 10*3/uL (ref 0.0–0.1)
Basophils Relative: 0.4 % (ref 0.0–3.0)
EOS PCT: 0.8 % (ref 0.0–5.0)
Eosinophils Absolute: 0.1 10*3/uL (ref 0.0–0.7)
HEMATOCRIT: 38.2 % (ref 36.0–46.0)
Hemoglobin: 12.4 g/dL (ref 12.0–15.0)
LYMPHS ABS: 1.6 10*3/uL (ref 0.7–4.0)
LYMPHS PCT: 13.5 % (ref 12.0–46.0)
MCHC: 32.6 g/dL (ref 30.0–36.0)
MCV: 91.7 fl (ref 78.0–100.0)
MONOS PCT: 5.6 % (ref 3.0–12.0)
Monocytes Absolute: 0.7 10*3/uL (ref 0.1–1.0)
NEUTROS ABS: 9.7 10*3/uL — AB (ref 1.4–7.7)
NEUTROS PCT: 79.7 % — AB (ref 43.0–77.0)
PLATELETS: 232 10*3/uL (ref 150.0–400.0)
RBC: 4.16 Mil/uL (ref 3.87–5.11)
RDW: 13.3 % (ref 11.5–15.5)
WBC: 12.2 10*3/uL — ABNORMAL HIGH (ref 4.0–10.5)

## 2017-02-08 LAB — COMPREHENSIVE METABOLIC PANEL
ALK PHOS: 85 U/L (ref 39–117)
ALT: 20 U/L (ref 0–35)
AST: 15 U/L (ref 0–37)
Albumin: 3.8 g/dL (ref 3.5–5.2)
BUN: 22 mg/dL (ref 6–23)
CO2: 31 meq/L (ref 19–32)
Calcium: 10.2 mg/dL (ref 8.4–10.5)
Chloride: 98 mEq/L (ref 96–112)
Creatinine, Ser: 0.74 mg/dL (ref 0.40–1.20)
GFR: 83.3 mL/min (ref >60.00)
GLUCOSE: 257 mg/dL — AB (ref 70–99)
POTASSIUM: 4.8 meq/L (ref 3.5–5.1)
Sodium: 136 mEq/L (ref 135–145)
Total Bilirubin: 0.3 mg/dL (ref 0.2–1.2)
Total Protein: 7.5 g/dL (ref 6.0–8.3)

## 2017-02-08 LAB — TSH: TSH: 0.2 u[IU]/mL — ABNORMAL LOW (ref 0.35–4.50)

## 2017-02-08 LAB — HEMOGLOBIN A1C: Hgb A1c MFr Bld: 8.8 % — ABNORMAL HIGH (ref 4.6–6.5)

## 2017-02-08 MED ORDER — ALPRAZOLAM 0.5 MG PO TABS: ORAL_TABLET | ORAL | 2 refills | Status: DC

## 2017-02-08 MED ORDER — ZOSTER VAC RECOMB ADJUVANTED 50 MCG/0.5ML IM SUSR: 0.5000 mL | Freq: Once | INTRAMUSCULAR | 1 refills | Status: AC

## 2017-02-08 NOTE — Patient Instructions (Signed)
Preventive Care 66 Years and Older, Female Preventive care refers to lifestyle choices and visits with your health care provider that can promote health and wellness. What does preventive care include?  A yearly physical exam. This is also called an annual well check.  Dental exams once or twice a year.  Routine eye exams. Ask your health care provider how often you should have your eyes checked.  Personal lifestyle choices, including: ? Daily care of your teeth and gums. ? Regular physical activity. ? Eating a healthy diet. ? Avoiding tobacco and drug use. ? Limiting alcohol use. ? Practicing safe sex. ? Taking low-dose aspirin every day. ? Taking vitamin and mineral supplements as recommended by your health care provider. What happens during an annual well check? The services and screenings done by your health care provider during your annual well check will depend on your age, overall health, lifestyle risk factors, and family history of disease. Counseling Your health care provider may ask you questions about your:  Alcohol use.  Tobacco use.  Drug use.  Emotional well-being.  Home and relationship well-being.  Sexual activity.  Eating habits.  History of falls.  Memory and ability to understand (cognition).  Work and work environment.  Reproductive health.  Screening You may have the following tests or measurements:  Height, weight, and BMI.  Blood pressure.  Lipid and cholesterol levels. These may be checked every 5 years, or more frequently if you are over 50 years old.  Skin check.  Lung cancer screening. You may have this screening every year starting at age 55 if you have a 30-pack-year history of smoking and currently smoke or have quit within the past 15 years.  Fecal occult blood test (FOBT) of the stool. You may have this test every year starting at age 50.  Flexible sigmoidoscopy or colonoscopy. You may have a sigmoidoscopy every 5 years or  a colonoscopy every 10 years starting at age 50.  Hepatitis C blood test.  Hepatitis B blood test.  Sexually transmitted disease (STD) testing.  Diabetes screening. This is done by checking your blood sugar (glucose) after you have not eaten for a while (fasting). You may have this done every 1-3 years.  Bone density scan. This is done to screen for osteoporosis. You may have this done starting at age 65.  Mammogram. This may be done every 1-2 years. Talk to your health care provider about how often you should have regular mammograms.  Talk with your health care provider about your test results, treatment options, and if necessary, the need for more tests. Vaccines Your health care provider may recommend certain vaccines, such as:  Influenza vaccine. This is recommended every year.  Tetanus, diphtheria, and acellular pertussis (Tdap, Td) vaccine. You may need a Td booster every 10 years.  Varicella vaccine. You may need this if you have not been vaccinated.  Zoster vaccine. You may need this after age 60.  Measles, mumps, and rubella (MMR) vaccine. You may need at least one dose of MMR if you were born in 1957 or later. You may also need a second dose.  Pneumococcal 13-valent conjugate (PCV13) vaccine. One dose is recommended after age 65.  Pneumococcal polysaccharide (PPSV23) vaccine. One dose is recommended after age 65.  Meningococcal vaccine. You may need this if you have certain conditions.  Hepatitis A vaccine. You may need this if you have certain conditions or if you travel or work in places where you may be exposed to hepatitis   A.  Hepatitis B vaccine. You may need this if you have certain conditions or if you travel or work in places where you may be exposed to hepatitis B.  Haemophilus influenzae type b (Hib) vaccine. You may need this if you have certain conditions.  Talk to your health care provider about which screenings and vaccines you need and how often you  need them. This information is not intended to replace advice given to you by your health care provider. Make sure you discuss any questions you have with your health care provider. Document Released: 04/19/2015 Document Revised: 12/11/2015 Document Reviewed: 01/22/2015 Elsevier Interactive Patient Education  2017 [employer].

## 2017-02-08 NOTE — Assessment & Plan Note (Signed)
Pt cleared for spinal fusion

## 2017-02-08 NOTE — Assessment & Plan Note (Signed)
hgba1c to be done minimize simple carbs. Increase exercise as tolerated. Continue current meds

## 2017-02-08 NOTE — Assessment & Plan Note (Signed)
Encouraged heart healthy diet, increase exercise, avoid trans fats, consider a krill oil cap daily 

## 2017-02-08 NOTE — Progress Notes (Signed)
Subjective:     [name] is a 66 y.o. female and is here for a comprehensive physical exam. The patient reports no new problems.  she needs surgical clearance for lumbar fusion with Dr Patrice Paradise as well.  Marland Kitchen  HPI HYPERTENSION   Blood pressure range-not checking   Chest pain- no      Dyspnea- no Lightheadedness- no   Edema- no  Other side effects - no   Medication compliance: good Low salt diet- yew    DIABETES    Blood Sugar ranges-140-- higher now due to prednisone  Polyuria- no New Visual problems- no  Hypoglycemic symptoms- no  Other side effects-no Medication compliance - good Last eye exam- due Foot exam- today   HYPERLIPIDEMIA  Medication compliance- good RUQ pain- no  Muscle aches- no Other side effects-no  Social History   Socioeconomic History  . Marital status: Married    Spouse name: Not on file  . Number of children: Not on file  . Years of education: Not on file  . Highest education level: Not on file  Social Needs  . Financial resource strain: Not on file  . Food insecurity - worry: Not on file  . Food insecurity - inability: Not on file  . Transportation needs - medical: Not on file  . Transportation needs - non-medical: Not on file  Occupational History  . Occupation: [employer] trader: [employer]  Tobacco Use  . Smoking status: Current Some Day Smoker    Years: 48.00    Last attempt to quit: 12/03/2014    Years since quitting: 2.1  . Smokeless tobacco: Never Used  Substance and Sexual Activity  . Alcohol use: No    Alcohol/week: 0.0 oz  . Drug use: No  . Sexual activity: Yes    Partners: Male    Comment: 2 -3 days a week   1 pack lasts 2 weeks  Other Topics Concern  . Not on file  Social History Narrative   Exercise--  3 flts steps a day for 8 hours   Health Maintenance  Topic Date Due  . COLONOSCOPY  07/04/2000  . MAMMOGRAM  02/20/2016  . URINE MICROALBUMIN  03/27/2016  . TETANUS/TDAP  01/23/2017  . OPHTHALMOLOGY  EXAM  01/28/2017  . FOOT EXAM  02/06/2017  . HEMOGLOBIN A1C  03/13/2017  . PNA vac Low Risk Adult (2 of 2 - PPSV23) 02/20/2019  . INFLUENZA VACCINE  Completed  . DEXA SCAN  Completed  . Hepatitis C Screening  Completed    The following portions of the patient's history were reviewed and updated as appropriate:  She  has a past medical history of Arthritis, Diabetes mellitus, and Thyroid disease. She does not have any pertinent problems on file. She  has a past surgical history that includes Cholecystectomy; Abdominal hysterectomy; Back surgery; Tonsilectomy, adenoidectomy, bilateral myringotomy and tubes; and Thyroid surgery. Her family history includes Alcohol abuse in her mother; Breast cancer in her cousin and daughter; Heart disease in her maternal grandfather; Stroke in her maternal grandmother and mother. She  reports that she has been smoking.  She has smoked for the past 48.00 years. she has never used smokeless tobacco. She reports that she does not drink alcohol or use drugs. She has a current medication list which includes the following prescription(s): alprazolam, one touch ultra 2, clotrimazole-betamethasone, fluoxetine, glimepiride, glucose blood, hydrochlorothiazide, insulin aspart, insulin pen needle, lantus solostar, levocetirizine, levothyroxine, pantoprazole, ropinirole, tramadol, and zoster vaccine  adjuvanted, and the following Facility-Administered Medications: ipratropium-albuterol. Current Outpatient Medications on File Prior to Visit  Medication Sig Dispense Refill  . Blood Glucose Monitoring Suppl (ONE TOUCH ULTRA 2) w/Device KIT Check blood sugar as directed. Dx:E11.65 1 each 0  . clotrimazole-betamethasone (LOTRISONE) cream Apply 1 application topically 2 (two) times daily. 30 g 0  . FLUoxetine (PROZAC) 20 MG capsule Take 20 mg by mouth daily.    Marland Kitchen glimepiride (AMARYL) 4 MG tablet Take 1 tablet (4 mg total) by mouth daily with breakfast. 90 tablet 1  . glucose blood  (ONE TOUCH ULTRA TEST) test strip Check blood sugar 4 times per day.E11.65 400 each 2  . hydrochlorothiazide (HYDRODIURIL) 25 MG tablet TAKE ONE TABLET BY MOUTH DAILY 90 tablet 0  . insulin aspart (NOVOLOG FLEXPEN) 100 UNIT/ML FlexPen Inject 10 Units into the skin 2 (two) times daily. Before breakfast and before dinner 15 mL 11  . Insulin Pen Needle (NOVOTWIST) 32G X 5 MM MISC Use daily at Bedtime with Lantus 100 each 0  . LANTUS SOLOSTAR 100 UNIT/ML Solostar Pen INJECT 25 UNITS INTO THE SKIN AT BEDTIME 15 pen 10  . levocetirizine (XYZAL) 5 MG tablet Take 1 tablet (5 mg total) by mouth every evening. 30 tablet 5  . levothyroxine (SYNTHROID, LEVOTHROID) 125 MCG tablet TAKE ONE TABLET BY MOUTH DAILY BEFORE BREAKFAST 90 tablet 0  . pantoprazole (PROTONIX) 40 MG tablet Take 1 tablet (40 mg total) by mouth daily. 30 tablet 11  . rOPINIRole (REQUIP) 0.25 MG tablet Take 1 by mouth at night for 2 days, then increase to 2 at night 60 tablet 0  . traMADol (ULTRAM) 50 MG tablet Take 1 tablet (50 mg total) by mouth every 8 (eight) hours as needed for pain. 30 tablet 0   Current Facility-Administered Medications on File Prior to Visit  Medication Dose Route Frequency Provider Last Rate Last Dose  . ipratropium-albuterol (DUONEB) 0.5-2.5 (3) MG/3ML nebulizer solution 3 mL  3 mL Nebulization Q6H Lowne Lyndal Pulley R, DO       She has No Known Allergies..  Review of Systems Review of Systems  Constitutional: Negative for activity change, appetite change and fatigue.  HENT: Negative for hearing loss, congestion, tinnitus and ear discharge.  dentist q40mEyes: Negative for visual disturbance (see optho q1y -- vision corrected to 20/20 with glasses).  Respiratory: Negative for cough, chest tightness and shortness of breath.   Cardiovascular: Negative for chest pain, palpitations and leg swelling.  Gastrointestinal: Negative for abdominal pain, diarrhea, constipation and abdominal distention.  Genitourinary:  Negative for urgency, frequency, decreased urine volume and difficulty urinating.  Musculoskeletal  + back pain Skin: Negative for color change, pallor and rash.  Neurological: Negative for dizziness, light-headedness, numbness and headaches.  Hematological: Negative for adenopathy. Does not bruise/bleed easily.  Psychiatric/Behavioral: Negative for suicidal ideas, confusion, sleep disturbance, self-injury, dysphoric mood, decreased concentration and agitation.       Objective:    BP 122/78   Temp 98.1 F (36.7 C)   Ht '4\' 11"'$  (1.499 m)   Wt 127 lb (57.6 kg)   BMI 25.65 kg/m  General appearance: alert, cooperative, appears stated age and no distress Head: Normocephalic, without obvious abnormality, atraumatic Eyes: conjunctivae/corneas clear. PERRL, EOM's intact. Fundi benign. Ears: normal TM's and external ear canals both ears Nose: Nares normal. Septum midline. Mucosa normal. No drainage or sinus tenderness. Throat: lips, mucosa, and tongue normal; teeth and gums normal Neck: no adenopathy, no carotid bruit, no JVD, supple,  symmetrical, trachea midline and thyroid not enlarged, symmetric, no tenderness/mass/nodules Back: symmetric, no curvature. ROM normal. No CVA tenderness. Lungs: clear to auscultation bilaterally Breasts: gyn Heart: regular rate and rhythm, S1, S2 normal, no murmur, click, rub or gallop Abdomen: soft, non-tender; bowel sounds normal; no masses,  no organomegaly Pelvic: deferred---gyn Extremities: extremities normal, atraumatic, no cyanosis or edema Pulses: 2+ and symmetric Skin: Skin color, texture, turgor normal. No rashes or lesions Lymph nodes: Cervical, supraclavicular, and axillary nodes normal. Neurologic: Alert and oriented X 3, normal strength and tone. Normal symmetric reflexes. Normal coordination and gait    Assessment:    Healthy female exam.      Plan:    ghm utd Check labs See After Visit Summary for Counseling Recommendations    1.  Pre-op exam Pt cleared for surgery - EKG 12-Lead--  No acute changes-- same as previous ekg   2. Pre-op evaluation   3. Hypothyroidism, unspecified type Check labs  con't meds  4. Hyperlipidemia LDL goal <70 Encouraged heart healthy diet, increase exercise, avoid trans fats, consider a krill oil cap daily  5. DM (diabetes mellitus) type II uncontrolled, periph vascular disorder ([place]) hgba1c to be done   minimize simple carbs. Increase exercise as tolerated. Continue current meds  6. Preventative health care See above

## 2017-02-08 NOTE — Assessment & Plan Note (Signed)
Check labs con't meds 

## 2017-02-12 ENCOUNTER — Telehealth: Payer: Self-pay | Admitting: *Deleted

## 2017-02-12 NOTE — Telephone Encounter (Signed)
Surgical clearance form faxed 02/08/17

## 2017-02-15 ENCOUNTER — Ambulatory Visit: Payer: Self-pay | Admitting: Neurology

## 2017-02-17 ENCOUNTER — Other Ambulatory Visit: Payer: Self-pay | Admitting: Family Medicine

## 2017-02-17 DIAGNOSIS — G4733 Obstructive sleep apnea (adult) (pediatric): Secondary | ICD-10-CM | POA: Diagnosis not present

## 2017-02-23 DIAGNOSIS — Z01818 Encounter for other preprocedural examination: Secondary | ICD-10-CM | POA: Diagnosis not present

## 2017-02-23 DIAGNOSIS — M5106 Intervertebral disc disorders with myelopathy, lumbar region: Secondary | ICD-10-CM | POA: Diagnosis not present

## 2017-03-01 ENCOUNTER — Ambulatory Visit: Payer: Self-pay | Admitting: Neurology

## 2017-03-01 DIAGNOSIS — M47896 Other spondylosis, lumbar region: Secondary | ICD-10-CM | POA: Diagnosis not present

## 2017-03-01 DIAGNOSIS — M4326 Fusion of spine, lumbar region: Secondary | ICD-10-CM | POA: Diagnosis not present

## 2017-03-01 DIAGNOSIS — M7552 Bursitis of left shoulder: Secondary | ICD-10-CM | POA: Diagnosis not present

## 2017-03-01 DIAGNOSIS — E119 Type 2 diabetes mellitus without complications: Secondary | ICD-10-CM | POA: Diagnosis not present

## 2017-03-01 DIAGNOSIS — E039 Hypothyroidism, unspecified: Secondary | ICD-10-CM | POA: Diagnosis not present

## 2017-03-01 DIAGNOSIS — M48062 Spinal stenosis, lumbar region with neurogenic claudication: Secondary | ICD-10-CM | POA: Diagnosis not present

## 2017-03-01 DIAGNOSIS — E785 Hyperlipidemia, unspecified: Secondary | ICD-10-CM | POA: Diagnosis not present

## 2017-03-01 DIAGNOSIS — Z79899 Other long term (current) drug therapy: Secondary | ICD-10-CM | POA: Diagnosis not present

## 2017-03-01 DIAGNOSIS — Z87891 Personal history of nicotine dependence: Secondary | ICD-10-CM | POA: Diagnosis not present

## 2017-03-01 DIAGNOSIS — G4730 Sleep apnea, unspecified: Secondary | ICD-10-CM | POA: Diagnosis not present

## 2017-03-01 DIAGNOSIS — M48061 Spinal stenosis, lumbar region without neurogenic claudication: Secondary | ICD-10-CM | POA: Diagnosis not present

## 2017-03-01 DIAGNOSIS — M961 Postlaminectomy syndrome, not elsewhere classified: Secondary | ICD-10-CM | POA: Diagnosis not present

## 2017-03-01 DIAGNOSIS — M5106 Intervertebral disc disorders with myelopathy, lumbar region: Secondary | ICD-10-CM | POA: Diagnosis not present

## 2017-03-01 DIAGNOSIS — Z981 Arthrodesis status: Secondary | ICD-10-CM | POA: Diagnosis not present

## 2017-03-01 DIAGNOSIS — Z794 Long term (current) use of insulin: Secondary | ICD-10-CM | POA: Diagnosis not present

## 2017-03-02 MED ORDER — LEVOTHYROXINE SODIUM 125 MCG PO TABS
125.00 | ORAL_TABLET | ORAL | Status: DC
Start: 2017-03-03 — End: 2017-03-02

## 2017-03-02 MED ORDER — HYDROCODONE-ACETAMINOPHEN 5-325 MG PO TABS
1.00 | ORAL_TABLET | ORAL | Status: DC
Start: ? — End: 2017-03-02

## 2017-03-02 MED ORDER — GLIPIZIDE ER 5 MG PO TB24
10.00 mg | ORAL_TABLET | ORAL | Status: DC
Start: 2017-03-03 — End: 2017-03-02

## 2017-03-02 MED ORDER — MORPHINE SULFATE (PF) 2 MG/ML IV SOLN
2.00 mg | INTRAVENOUS | Status: DC
Start: ? — End: 2017-03-02

## 2017-03-02 MED ORDER — FLUOXETINE HCL 20 MG PO CAPS
20.00 mg | ORAL_CAPSULE | ORAL | Status: DC
Start: 2017-03-03 — End: 2017-03-02

## 2017-03-02 MED ORDER — ONDANSETRON HCL 4 MG/2ML IJ SOLN
4.00 mg | INTRAMUSCULAR | Status: DC
Start: ? — End: 2017-03-02

## 2017-03-02 MED ORDER — METFORMIN HCL ER 500 MG PO TB24
500.00 mg | ORAL_TABLET | ORAL | Status: DC
Start: 2017-03-03 — End: 2017-03-02

## 2017-03-02 MED ORDER — SODIUM CHLORIDE 0.9 % IV SOLN
INTRAVENOUS | Status: DC
Start: ? — End: 2017-03-02

## 2017-03-02 MED ORDER — ACETAMINOPHEN 325 MG PO TABS
650.00 mg | ORAL_TABLET | ORAL | Status: DC
Start: ? — End: 2017-03-02

## 2017-03-02 MED ORDER — FENTANYL CITRATE (PF) 2500 MCG/50ML IJ SOLN
25.00 | INTRAMUSCULAR | Status: DC
Start: ? — End: 2017-03-02

## 2017-03-02 MED ORDER — KETOROLAC TROMETHAMINE 30 MG/ML IJ SOLN
15.00 mg | INTRAMUSCULAR | Status: DC
Start: ? — End: 2017-03-02

## 2017-03-02 MED ORDER — HYDROCODONE-ACETAMINOPHEN 5-325 MG PO TABS
2.00 | ORAL_TABLET | ORAL | Status: DC
Start: ? — End: 2017-03-02

## 2017-03-02 MED ORDER — GENERIC EXTERNAL MEDICATION
25.00 | Status: DC
Start: 2017-03-02 — End: 2017-03-02

## 2017-03-02 MED ORDER — INSULIN LISPRO 100 UNIT/ML ~~LOC~~ SOLN
10.00 | SUBCUTANEOUS | Status: DC
Start: 2017-03-02 — End: 2017-03-02

## 2017-03-02 MED ORDER — METHOCARBAMOL 500 MG PO TABS
500.00 mg | ORAL_TABLET | ORAL | Status: DC
Start: 2017-03-02 — End: 2017-03-02

## 2017-03-02 MED ORDER — ALPRAZOLAM 0.5 MG PO TABS
.50 mg | ORAL_TABLET | ORAL | Status: DC
Start: ? — End: 2017-03-02

## 2017-03-02 MED ORDER — ASPIRIN 325 MG PO TABS
325.00 mg | ORAL_TABLET | ORAL | Status: DC
Start: 2017-03-03 — End: 2017-03-02

## 2017-03-02 MED ORDER — HYDROCHLOROTHIAZIDE 25 MG PO TABS
25.00 mg | ORAL_TABLET | ORAL | Status: DC
Start: 2017-03-03 — End: 2017-03-02

## 2017-03-02 MED ORDER — GENERIC EXTERNAL MEDICATION
Status: DC
Start: ? — End: 2017-03-02

## 2017-03-02 MED ORDER — PROMETHAZINE HCL 25 MG/ML IJ SOLN
6.25 mg | INTRAMUSCULAR | Status: DC
Start: ? — End: 2017-03-02

## 2017-03-19 ENCOUNTER — Other Ambulatory Visit: Payer: Self-pay | Admitting: Family Medicine

## 2017-03-19 DIAGNOSIS — G4733 Obstructive sleep apnea (adult) (pediatric): Secondary | ICD-10-CM | POA: Diagnosis not present

## 2017-03-22 ENCOUNTER — Ambulatory Visit: Payer: Self-pay | Admitting: Neurology

## 2017-03-25 ENCOUNTER — Other Ambulatory Visit: Payer: Self-pay | Admitting: *Deleted

## 2017-03-25 MED ORDER — GLUCOSE BLOOD VI STRP: ORAL_STRIP | 2 refills | Status: DC

## 2017-04-05 ENCOUNTER — Other Ambulatory Visit: Payer: PPO

## 2017-04-09 DIAGNOSIS — M25562 Pain in left knee: Secondary | ICD-10-CM | POA: Diagnosis not present

## 2017-04-09 DIAGNOSIS — R2689 Other abnormalities of gait and mobility: Secondary | ICD-10-CM | POA: Diagnosis not present

## 2017-04-09 DIAGNOSIS — M5106 Intervertebral disc disorders with myelopathy, lumbar region: Secondary | ICD-10-CM | POA: Diagnosis not present

## 2017-04-19 DIAGNOSIS — G4733 Obstructive sleep apnea (adult) (pediatric): Secondary | ICD-10-CM | POA: Diagnosis not present

## 2017-04-29 ENCOUNTER — Other Ambulatory Visit: Payer: Self-pay | Admitting: Family Medicine

## 2017-05-10 ENCOUNTER — Ambulatory Visit (INDEPENDENT_AMBULATORY_CARE_PROVIDER_SITE_OTHER): Payer: PPO | Admitting: Family Medicine

## 2017-05-10 ENCOUNTER — Other Ambulatory Visit: Payer: Self-pay | Admitting: Family Medicine

## 2017-05-10 ENCOUNTER — Ambulatory Visit (HOSPITAL_BASED_OUTPATIENT_CLINIC_OR_DEPARTMENT_OTHER)
Admission: RE | Admit: 2017-05-10 | Discharge: 2017-05-10 | Disposition: A | Discharge status: Home / Self Care | Payer: PPO | Source: Ambulatory Visit | Attending: Family Medicine | Admitting: Family Medicine

## 2017-05-10 ENCOUNTER — Encounter: Payer: Self-pay | Admitting: Family Medicine

## 2017-05-10 VITALS — BP 108/78 | HR 94 | Temp 98.0°F | Resp 16 | Ht 59.0 in | Wt 126.6 lb

## 2017-05-10 DIAGNOSIS — G4489 Other headache syndrome: Secondary | ICD-10-CM

## 2017-05-10 DIAGNOSIS — R51 Headache: Secondary | ICD-10-CM

## 2017-05-10 DIAGNOSIS — J324 Chronic pansinusitis: Secondary | ICD-10-CM

## 2017-05-10 DIAGNOSIS — H5203 Hypermetropia, bilateral: Secondary | ICD-10-CM | POA: Diagnosis not present

## 2017-05-10 DIAGNOSIS — H52223 Regular astigmatism, bilateral: Secondary | ICD-10-CM | POA: Diagnosis not present

## 2017-05-10 DIAGNOSIS — H524 Presbyopia: Secondary | ICD-10-CM | POA: Diagnosis not present

## 2017-05-10 DIAGNOSIS — H2513 Age-related nuclear cataract, bilateral: Secondary | ICD-10-CM | POA: Diagnosis not present

## 2017-05-10 DIAGNOSIS — R519 Headache, unspecified: Secondary | ICD-10-CM

## 2017-05-10 DIAGNOSIS — R509 Fever, unspecified: Secondary | ICD-10-CM | POA: Diagnosis not present

## 2017-05-10 DIAGNOSIS — E119 Type 2 diabetes mellitus without complications: Secondary | ICD-10-CM | POA: Diagnosis not present

## 2017-05-10 DIAGNOSIS — J029 Acute pharyngitis, unspecified: Secondary | ICD-10-CM

## 2017-05-10 LAB — POCT INFLUENZA A/B
Influenza A, POC: NEGATIVE
Influenza B, POC: NEGATIVE

## 2017-05-10 LAB — POCT RAPID STREP A (OFFICE): RAPID STREP A SCREEN: NEGATIVE

## 2017-05-10 MED ORDER — AMOXICILLIN-POT CLAVULANATE 875-125 MG PO TABS: 1.0000 | ORAL_TABLET | Freq: Two times a day (BID) | ORAL | 0 refills | Status: DC

## 2017-05-10 NOTE — Patient Instructions (Signed)

## 2017-05-10 NOTE — Progress Notes (Signed)
cultruPatient ID: [name], female   DOB: 18-Jan-1951, 67 y.o.   MRN: [id number]    Subjective:  I acted as a Education administrator for Dr. Carollee Herter.  Guerry Bruin, [place]   Patient ID: [name], female    DOB: [birth year], 67 y.o.   MRN: [id number]  Chief Complaint  Patient presents with  . Fever    Fever   This is a new problem. Episode onset: last saturday. Maximum temperature: 101. The temperature was taken using an oral thermometer. Associated symptoms include congestion, ear pain (both ears), headaches, nausea and a sore throat. Pertinent negatives include no coughing. She has tried acetaminophen for the symptoms.    Patient is in today for fever and headache.  She states the headache is the worse headache she has ever had.  She has also had fevers up to 101--- symptoms started last Sunday.     Patient Care Team: Carollee Herter, Alferd Apa, DO as PCP - General (Family Medicine)   Past Medical History:  Diagnosis Date  . Arthritis   . Diabetes mellitus   . Thyroid disease     Past Surgical History:  Procedure Laterality Date  . ABDOMINAL HYSTERECTOMY    . BACK SURGERY     x3  . CHOLECYSTECTOMY    . THYROID SURGERY    . TONSILECTOMY, ADENOIDECTOMY, BILATERAL MYRINGOTOMY AND TUBES      Family History  Problem Relation Age of Onset  . Stroke Mother   . Alcohol abuse Mother   . Stroke Maternal Grandmother   . Heart disease Maternal Grandfather   . Breast cancer Cousin   . Breast cancer Daughter     Social History   Socioeconomic History  . Marital status: Married    Spouse name: Not on file  . Number of children: Not on file  . Years of education: Not on file  . Highest education level: Not on file  Social Needs  . Financial resource strain: Not on file  . Food insecurity - worry: Not on file  . Food insecurity - inability: Not on file  . Transportation needs - medical: Not on file  . Transportation needs - non-medical: Not on file  Occupational History  . Occupation:  [employer] trader: [employer]  Tobacco Use  . Smoking status: Current Some Day Smoker    Years: 48.00    Last attempt to quit: 12/03/2014    Years since quitting: 2.4  . Smokeless tobacco: Never Used  Substance and Sexual Activity  . Alcohol use: No    Alcohol/week: 0.0 oz  . Drug use: No  . Sexual activity: Yes    Partners: Male    Comment: 2 -3 days a week   1 pack lasts 2 weeks  Other Topics Concern  . Not on file  Social History Narrative   Exercise--  3 flts steps a day for 8 hours    Outpatient Medications Prior to Visit  Medication Sig Dispense Refill  . ALPRAZolam (XANAX) 0.5 MG tablet TAKE ONE TABLET BY MOUTH THREE TIMES A DAY AS NEEDED FOR ANXIETY OR SLEEP 30 tablet 2  . Blood Glucose Monitoring Suppl (ONE TOUCH ULTRA 2) w/Device KIT Check blood sugar as directed. Dx:E11.65 1 each 0  . clotrimazole-betamethasone (LOTRISONE) cream Apply 1 application topically 2 (two) times daily. 30 g 0  . FLUoxetine (PROZAC) 20 MG capsule Take 1 capsule (20 mg total) by mouth daily. 30 capsule 5  . glimepiride (AMARYL) 4  MG tablet Take 1 tablet (4 mg total) by mouth daily with breakfast. 90 tablet 1  . glucose blood (ONE TOUCH ULTRA TEST) test strip Check blood sugar 4 times per day.E11.65 400 each 2  . insulin aspart (NOVOLOG FLEXPEN) 100 UNIT/ML FlexPen Inject 10 Units into the skin 2 (two) times daily. Before breakfast and before dinner 15 mL 11  . Insulin Pen Needle (NOVOTWIST) 32G X 5 MM MISC Use daily at Bedtime with Lantus 100 each 0  . LANTUS SOLOSTAR 100 UNIT/ML Solostar Pen INJECT 25 UNITS INTO THE SKIN AT BEDTIME 15 pen 10  . levocetirizine (XYZAL) 5 MG tablet Take 1 tablet (5 mg total) by mouth every evening. 30 tablet 5  . levothyroxine (SYNTHROID, LEVOTHROID) 125 MCG tablet TAKE ONE TABLET BY MOUTH DAILY BEFORE BREAKFAST 13 tablet 0  . pantoprazole (PROTONIX) 40 MG tablet Take 1 tablet (40 mg total) by mouth daily. 30 tablet 11  . rOPINIRole (REQUIP) 0.25 MG  tablet Take 1 by mouth at night for 2 days, then increase to 2 at night 60 tablet 0  . traMADol (ULTRAM) 50 MG tablet Take 1 tablet (50 mg total) by mouth every 8 (eight) hours as needed for pain. 30 tablet 0  . hydrochlorothiazide (HYDRODIURIL) 25 MG tablet TAKE ONE TABLET BY MOUTH DAILY 81 tablet 0   Facility-Administered Medications Prior to Visit  Medication Dose Route Frequency Provider Last Rate Last Dose  . ipratropium-albuterol (DUONEB) 0.5-2.5 (3) MG/3ML nebulizer solution 3 mL  3 mL Nebulization Q6H Lowne Chase, Juanpablo Ciresi R, DO        No Known Allergies  Review of Systems  Constitutional: Positive for fever.  HENT: Positive for congestion, ear pain (both ears) and sore throat.   Respiratory: Negative for cough.   Gastrointestinal: Positive for nausea.  Neurological: Positive for headaches.       Objective:    Physical Exam  Constitutional: She is oriented to person, place, and time. She appears well-developed and well-nourished.  HENT:  Right Ear: External ear normal.  Left Ear: External ear normal.  Nose: Right sinus exhibits frontal sinus tenderness. Left sinus exhibits frontal sinus tenderness.  + PND + errythema  Eyes: Conjunctivae are normal. Right eye exhibits no discharge. Left eye exhibits no discharge.  Cardiovascular: Normal rate, regular rhythm and normal heart sounds.  No murmur heard. Pulmonary/Chest: Effort normal and breath sounds normal. No respiratory distress. She has no wheezes. She has no rales. She exhibits no tenderness.  Musculoskeletal: She exhibits no edema.  Lymphadenopathy:    She has cervical adenopathy.  Neurological: She is alert and oriented to person, place, and time.  Nursing note and vitals reviewed.   BP 108/78 (BP Location: Left Arm, Cuff Size: Normal)   Pulse 94   Temp 98 F (36.7 C) (Oral)   Resp 16   Ht '4\' 11"'$  (1.499 m)   Wt 126 lb 9.6 oz (57.4 kg)   SpO2 98%   BMI 25.57 kg/m  Wt Readings from Last 3 Encounters:    05/10/17 126 lb 9.6 oz (57.4 kg)  02/08/17 127 lb (57.6 kg)  01/28/17 127 lb 3.2 oz (57.7 kg)   BP Readings from Last 3 Encounters:  05/10/17 108/78  02/08/17 122/78  01/28/17 121/74     Immunization History  Administered Date(s) Administered  . DTaP 01/24/1979  . Influenza Whole 01/22/2010, 02/05/2012  . Influenza, High Dose Seasonal PF 01/29/2017  . Influenza,inj,Quad PF,6+ Mos 02/19/2014  . Influenza-Unspecified 01/17/2015, 01/28/2016  . Pneumococcal  Conjugate-13 07/08/2015  . Pneumococcal Polysaccharide-23 01/05/2009, 02/19/2014  . Td 01/24/2007  . Zoster 02/16/2012    Health Maintenance  Topic Date Due  . COLONOSCOPY  07/04/2000  . MAMMOGRAM  02/20/2016  . TETANUS/TDAP  01/23/2017  . OPHTHALMOLOGY EXAM  01/28/2017  . FOOT EXAM  02/06/2017  . HEMOGLOBIN A1C  08/08/2017  . URINE MICROALBUMIN  02/08/2018  . PNA vac Low Risk Adult (2 of 2 - PPSV23) 02/20/2019  . INFLUENZA VACCINE  Completed  . DEXA SCAN  Completed  . Hepatitis C Screening  Completed    Lab Results  Component Value Date   WBC 6.3 05/10/2017   HGB 13.3 05/10/2017   HCT 40.0 05/10/2017   PLT 235.0 05/10/2017   GLUCOSE 288 (H) 05/10/2017   CHOL 151 02/08/2017   TRIG 144.0 02/08/2017   HDL 42.50 02/08/2017   LDLDIRECT 162.0 03/28/2015   LDLCALC 80 02/08/2017   ALT 25 05/10/2017   AST 27 05/10/2017   NA 135 05/10/2017   K 3.9 05/10/2017   CL 94 (L) 05/10/2017   CREATININE 0.64 05/10/2017   BUN 15 05/10/2017   CO2 32 05/10/2017   TSH 0.20 (L) 02/08/2017   HGBA1C 8.8 (H) 02/08/2017   MICROALBUR 0.8 02/08/2017    Lab Results  Component Value Date   TSH 0.20 (L) 02/08/2017   Lab Results  Component Value Date   WBC 6.3 05/10/2017   HGB 13.3 05/10/2017   HCT 40.0 05/10/2017   MCV 93.1 05/10/2017   PLT 235.0 05/10/2017   Lab Results  Component Value Date   NA 135 05/10/2017   K 3.9 05/10/2017   CO2 32 05/10/2017   GLUCOSE 288 (H) 05/10/2017   BUN 15 05/10/2017   CREATININE 0.64  05/10/2017   BILITOT 0.5 05/10/2017   ALKPHOS 99 05/10/2017   AST 27 05/10/2017   ALT 25 05/10/2017   PROT 8.8 (H) 05/10/2017   ALBUMIN 4.0 05/10/2017   CALCIUM 10.2 05/10/2017   GFR 98.42 05/10/2017   Lab Results  Component Value Date   CHOL 151 02/08/2017   Lab Results  Component Value Date   HDL 42.50 02/08/2017   Lab Results  Component Value Date   LDLCALC 80 02/08/2017   Lab Results  Component Value Date   TRIG 144.0 02/08/2017   Lab Results  Component Value Date   CHOLHDL 4 02/08/2017   Lab Results  Component Value Date   HGBA1C 8.8 (H) 02/08/2017         Assessment & Plan:   Problem List Items Addressed This Visit    None    Visit Diagnoses    Fever, unspecified fever cause    -  Primary   Relevant Orders   POCT Influenza A/B (Completed)   CBC with Differential/Platelet (Completed)   Comprehensive metabolic panel (Completed)   POCT Urinalysis Dipstick (Automated)   Sore throat       Relevant Orders   POCT rapid strep A (Completed)   Culture, Group A Strep (Completed)   Nonintractable headache, unspecified chronicity pattern, unspecified headache type       Relevant Orders   CBC with Differential/Platelet (Completed)   Comprehensive metabolic panel (Completed)   POCT Urinalysis Dipstick (Automated)   Sedimentation rate (Completed)   Other headache syndrome       Relevant Orders   CT Head Wo Contrast (Completed)   Pansinusitis, unspecified chronicity       Relevant Medications   amoxicillin-clavulanate (AUGMENTIN) 875-125 MG tablet  I am having Toyoko A. Brasil start on amoxicillin-clavulanate. I am also having her maintain her Insulin Pen Needle, traMADol, pantoprazole, levocetirizine, ONE TOUCH ULTRA 2, rOPINIRole, clotrimazole-betamethasone, insulin aspart, glimepiride, LANTUS SOLOSTAR, ALPRAZolam, FLUoxetine, glucose blood, and levothyroxine. We will continue to administer ipratropium-albuterol.  Meds ordered this encounter    Medications  . amoxicillin-clavulanate (AUGMENTIN) 875-125 MG tablet    Sig: Take 1 tablet by mouth 2 (two) times daily.    Dispense:  20 tablet    Refill:  0    CMA served as scribe during this visit. History, Physical and Plan performed by medical provider. Documentation and orders reviewed and attested to.  [name], DO

## 2017-05-11 ENCOUNTER — Telehealth: Payer: Self-pay | Admitting: Family Medicine

## 2017-05-11 LAB — COMPREHENSIVE METABOLIC PANEL
ALT: 25 U/L (ref 0–35)
AST: 27 U/L (ref 0–37)
Albumin: 4 g/dL (ref 3.5–5.2)
Alkaline Phosphatase: 99 U/L (ref 39–117)
BUN: 15 mg/dL (ref 6–23)
CO2: 32 meq/L (ref 19–32)
CREATININE: 0.64 mg/dL (ref 0.40–1.20)
Calcium: 10.2 mg/dL (ref 8.4–10.5)
Chloride: 94 mEq/L — ABNORMAL LOW (ref 96–112)
GFR: 98.42 mL/min (ref >60.00)
GLUCOSE: 288 mg/dL — AB (ref 70–99)
Potassium: 3.9 mEq/L (ref 3.5–5.1)
SODIUM: 135 meq/L (ref 135–145)
Total Bilirubin: 0.5 mg/dL (ref 0.2–1.2)
Total Protein: 8.8 g/dL — ABNORMAL HIGH (ref 6.0–8.3)

## 2017-05-11 LAB — CBC WITH DIFFERENTIAL/PLATELET
BASOS ABS: 0 10*3/uL (ref 0.0–0.1)
Basophils Relative: 0.8 % (ref 0.0–3.0)
EOS ABS: 0.1 10*3/uL (ref 0.0–0.7)
Eosinophils Relative: 1.8 % (ref 0.0–5.0)
HEMATOCRIT: 40 % (ref 36.0–46.0)
Hemoglobin: 13.3 g/dL (ref 12.0–15.0)
Lymphocytes Relative: 24.2 % (ref 12.0–46.0)
Lymphs Abs: 1.5 10*3/uL (ref 0.7–4.0)
MCHC: 33.1 g/dL (ref 30.0–36.0)
MCV: 93.1 fl (ref 78.0–100.0)
Monocytes Absolute: 0.6 10*3/uL (ref 0.1–1.0)
Monocytes Relative: 10.2 % (ref 3.0–12.0)
NEUTROS ABS: 4 10*3/uL (ref 1.4–7.7)
NEUTROS PCT: 63 % (ref 43.0–77.0)
PLATELETS: 235 10*3/uL (ref 150.0–400.0)
RBC: 4.3 Mil/uL (ref 3.87–5.11)
RDW: 14.3 % (ref 11.5–15.5)
WBC: 6.3 10*3/uL (ref 4.0–10.5)

## 2017-05-11 LAB — SEDIMENTATION RATE: Sed Rate: 31 mm/hr — ABNORMAL HIGH (ref 0–30)

## 2017-05-11 NOTE — Telephone Encounter (Signed)
Copied from [place] [id number]. Topic: Quick Communication - See Telephone Encounter >> May 11, 2017  1:11 PM Cleaster Corin, NT wrote: CRM for notification. See Telephone encounter for:   05/11/17. Pt. Calling to see her results have came back yet from her lab work and scan. Pt. Can be reached at [phone]

## 2017-05-12 LAB — CULTURE, GROUP A STREP
MICRO NUMBER: 90147145
SPECIMEN QUALITY: ADEQUATE

## 2017-05-12 NOTE — Telephone Encounter (Signed)
Patient notified

## 2017-05-17 ENCOUNTER — Ambulatory Visit: Payer: Self-pay

## 2017-05-17 DIAGNOSIS — M4326 Fusion of spine, lumbar region: Secondary | ICD-10-CM | POA: Diagnosis not present

## 2017-05-17 NOTE — Telephone Encounter (Signed)
Patient called in with c/o "fever and back pain." She says "this weekend my temperature got up to 102, today it was 99.7. I've taken tylenol every 4 hours. I went to my back doctor today and she told me to call you. I was seen last week in the office and I had all the tests run, but no urine was checked and I've still had the fever. I've been taking the antibiotics prescribed." I asked where is the pain, she says "left lower kind of to the side. I wonder if I need a urine test done." I asked what does her urine look like, she says "clear." The only other symptoms she has is a headache. According to protocol, see PCP within 3 days, appointment made tomorrow at 1015 with Dr. Carollee Herter, care advice given, patient verbalized understanding.  Reason for Disposition . Fever > 100.5 F (38.1 C)  Answer Assessment - Initial Assessment Questions 1. LOCATION: "Where does it hurt?" (e.g., left, right)     Left 2. ONSET: "When did the pain start?"     1-1.5 weeks ago 3. SEVERITY: "How bad is the pain?" (e.g., Scale 1-10; mild, moderate, or severe)   - MILD (1-3): doesn't interfere with normal activities    - MODERATE (4-7): interferes with normal activities or awakens from sleep    - SEVERE (8-10): excruciating pain and patient unable to do normal activities (stays in bed)      8 4. PATTERN: "Does the pain come and go, or is it constant?"      Constant 5. CAUSE: "What do you think is causing the pain?"     I think it's infection somewhere 6. OTHER SYMPTOMS:  "Do you have any other symptoms?" (e.g., fever, abdominal pain, vomiting, leg weakness, burning with urination, blood in urine)     Fever, headache 7. PREGNANCY:  "Is there any chance you are pregnant?" "When was your last menstrual period?"     No  Protocols used: FLANK PAIN-A-AH

## 2017-05-18 ENCOUNTER — Encounter: Payer: Self-pay | Admitting: Family Medicine

## 2017-05-18 ENCOUNTER — Ambulatory Visit (INDEPENDENT_AMBULATORY_CARE_PROVIDER_SITE_OTHER): Payer: PPO | Admitting: Family Medicine

## 2017-05-18 VITALS — BP 122/70 | HR 98 | Temp 98.2°F | Resp 16 | Ht 59.0 in | Wt 124.8 lb

## 2017-05-18 DIAGNOSIS — E1151 Type 2 diabetes mellitus with diabetic peripheral angiopathy without gangrene: Secondary | ICD-10-CM | POA: Diagnosis not present

## 2017-05-18 DIAGNOSIS — IMO0002 Reserved for concepts with insufficient information to code with codable children: Secondary | ICD-10-CM

## 2017-05-18 DIAGNOSIS — E1165 Type 2 diabetes mellitus with hyperglycemia: Secondary | ICD-10-CM

## 2017-05-18 DIAGNOSIS — R109 Unspecified abdominal pain: Secondary | ICD-10-CM | POA: Diagnosis not present

## 2017-05-18 LAB — POC URINALSYSI DIPSTICK (AUTOMATED)
Bilirubin, UA: NEGATIVE
Blood, UA: NEGATIVE
GLUCOSE UA: 100
Ketones, UA: 5
LEUKOCYTES UA: NEGATIVE
NITRITE UA: NEGATIVE
Protein, UA: 15
UROBILINOGEN UA: 0.2 U/dL
pH, UA: 6 (ref 5.0–8.0)

## 2017-05-18 MED ORDER — DULAGLUTIDE 0.75 MG/0.5ML ~~LOC~~ SOAJ: SUBCUTANEOUS | 3 refills | Status: DC

## 2017-05-18 NOTE — Patient Instructions (Signed)
Renal Colic Renal colic is pain that is caused by passing a kidney stone. The pain can be sharp and severe. It may be felt in the back, abdomen, side (flank), or groin. It can cause nausea. Renal colic can come and go. Follow these instructions at home: Watch your condition for any changes. The following actions may help to lessen any discomfort that you are feeling:  Take medicines only as directed by your health care provider.  Ask your health care provider if it is okay to take over-the-counter pain medicine.  Drink enough fluid to keep your urine clear or pale yellow. Drink 6-8 glasses of water each day.  Limit the amount of salt that you eat to less than 2 grams per day.  Reduce the amount of protein in your diet. Eat less meat, fish, nuts, and dairy.  Avoid foods such as spinach, rhubarb, nuts, or bran. These may make kidney stones more likely to form.  Contact a health care provider if:  You have a fever or chills.  Your urine smells bad or looks cloudy.  You have pain or burning when you pass urine. Get help right away if:  Your flank pain or groin pain suddenly worsens.  You become confused or disoriented or you lose consciousness. This information is not intended to replace advice given to you by your health care provider. Make sure you discuss any questions you have with your health care provider. Document Released: 12/31/2004 Document Revised: 08/27/2015 Document Reviewed: 01/31/2014 Elsevier Interactive Patient Education  2018 Elsevier Inc.  

## 2017-05-18 NOTE — Assessment & Plan Note (Signed)
Pt requesting to change amaryl to something else tru

## 2017-05-18 NOTE — Progress Notes (Signed)
Patient ID: [name], female   DOB: September 28, 1950, 67 y.o.   MRN: [id number]     Subjective:  I acted as a Education administrator for Dr. Carollee Herter.  Guerry Bruin, [place]   Patient ID: [name], female    DOB: Feb 25, 1951, 67 y.o.   MRN: [id number]  Chief Complaint  Patient presents with  . Back Pain  . Fever    HPI  Patient is in today for low back and fever.  L flank pain -- pain moving around to LLQ.  No NVD Pt still having fevers --- this am none Urinary frequency, no dysuria, no hematuria.  Patient Care Team: Carollee Herter, Alferd Apa, DO as PCP - General (Family Medicine) Starling Manns, MD (Orthopedic Surgery) [facility], [facility]   Past Medical History:  Diagnosis Date  . Arthritis   . Diabetes mellitus   . Thyroid disease     Past Surgical History:  Procedure Laterality Date  . ABDOMINAL HYSTERECTOMY    . BACK SURGERY     x3  . CHOLECYSTECTOMY    . THYROID SURGERY    . TONSILECTOMY, ADENOIDECTOMY, BILATERAL MYRINGOTOMY AND TUBES      Family History  Problem Relation Age of Onset  . Stroke Mother   . Alcohol abuse Mother   . Stroke Maternal Grandmother   . Heart disease Maternal Grandfather   . Breast cancer Cousin   . Breast cancer Daughter     Social History   Socioeconomic History  . Marital status: Married    Spouse name: Not on file  . Number of children: Not on file  . Years of education: Not on file  . Highest education level: Not on file  Social Needs  . Financial resource strain: Not on file  . Food insecurity - worry: Not on file  . Food insecurity - inability: Not on file  . Transportation needs - medical: Not on file  . Transportation needs - non-medical: Not on file  Occupational History  . Occupation: [employer] trader: [employer]  Tobacco Use  . Smoking status: Current Some Day Smoker    Years: 48.00    Last attempt to quit: 12/03/2014    Years since quitting: 2.4  . Smokeless tobacco: Never Used  Substance and  Sexual Activity  . Alcohol use: No    Alcohol/week: 0.0 oz  . Drug use: No  . Sexual activity: Yes    Partners: Male    Comment: 2 -3 days a week   1 pack lasts 2 weeks  Other Topics Concern  . Not on file  Social History Narrative   Exercise--  3 flts steps a day for 8 hours    Outpatient Medications Prior to Visit  Medication Sig Dispense Refill  . ALPRAZolam (XANAX) 0.5 MG tablet TAKE ONE TABLET BY MOUTH THREE TIMES A DAY AS NEEDED FOR ANXIETY OR SLEEP 30 tablet 2  . amoxicillin-clavulanate (AUGMENTIN) 875-125 MG tablet Take 1 tablet by mouth 2 (two) times daily. 20 tablet 0  . Blood Glucose Monitoring Suppl (ONE TOUCH ULTRA 2) w/Device KIT Check blood sugar as directed. Dx:E11.65 1 each 0  . clotrimazole-betamethasone (LOTRISONE) cream Apply 1 application topically 2 (two) times daily. 30 g 0  . FLUoxetine (PROZAC) 20 MG capsule Take 1 capsule (20 mg total) by mouth daily. 30 capsule 5  . glucose blood (ONE TOUCH ULTRA TEST) test strip Check blood sugar 4 times per day.E11.65 400 each 2  .  hydrochlorothiazide (HYDRODIURIL) 25 MG tablet TAKE ONE TABLET BY MOUTH DAILY 81 tablet 0  . insulin aspart (NOVOLOG FLEXPEN) 100 UNIT/ML FlexPen Inject 10 Units into the skin 2 (two) times daily. Before breakfast and before dinner 15 mL 11  . Insulin Pen Needle (NOVOTWIST) 32G X 5 MM MISC Use daily at Bedtime with Lantus 100 each 0  . LANTUS SOLOSTAR 100 UNIT/ML Solostar Pen INJECT 25 UNITS INTO THE SKIN AT BEDTIME 15 pen 10  . levocetirizine (XYZAL) 5 MG tablet Take 1 tablet (5 mg total) by mouth every evening. 30 tablet 5  . levothyroxine (SYNTHROID, LEVOTHROID) 125 MCG tablet TAKE ONE TABLET BY MOUTH DAILY BEFORE BREAKFAST 13 tablet 0  . pantoprazole (PROTONIX) 40 MG tablet Take 1 tablet (40 mg total) by mouth daily. 30 tablet 11  . rOPINIRole (REQUIP) 0.25 MG tablet Take 1 by mouth at night for 2 days, then increase to 2 at night 60 tablet 0  . traMADol (ULTRAM) 50 MG tablet Take 1 tablet (50  mg total) by mouth every 8 (eight) hours as needed for pain. 30 tablet 0  . glimepiride (AMARYL) 4 MG tablet Take 1 tablet (4 mg total) by mouth daily with breakfast. 90 tablet 1   Facility-Administered Medications Prior to Visit  Medication Dose Route Frequency Provider Last Rate Last Dose  . ipratropium-albuterol (DUONEB) 0.5-2.5 (3) MG/3ML nebulizer solution 3 mL  3 mL Nebulization Q6H Lowne Chase, Kaylany Tesoriero R, DO        No Known Allergies  Review of Systems  Constitutional: Negative for chills, fever and malaise/fatigue.  HENT: Negative for congestion and hearing loss.   Eyes: Negative for discharge.  Respiratory: Negative for cough, sputum production and shortness of breath.   Cardiovascular: Negative for chest pain, palpitations and leg swelling.  Gastrointestinal: Negative for abdominal pain, blood in stool, constipation, diarrhea, heartburn, nausea and vomiting.  Genitourinary: Positive for flank pain and frequency. Negative for dysuria, hematuria and urgency.  Musculoskeletal: Positive for back pain. Negative for falls and myalgias.  Skin: Negative for rash.  Neurological: Negative for dizziness, sensory change, loss of consciousness, weakness and headaches.  Endo/Heme/Allergies: Negative for environmental allergies. Does not bruise/bleed easily.  Psychiatric/Behavioral: Negative for depression and suicidal ideas. The patient is not nervous/anxious and does not have insomnia.        Objective:    Physical Exam  Constitutional: She is oriented to person, place, and time. She appears well-developed and well-nourished.  HENT:  Head: Normocephalic and atraumatic.  Eyes: Conjunctivae and EOM are normal.  Neck: Normal range of motion. Neck supple. No JVD present. Carotid bruit is not present. No thyromegaly present.  Cardiovascular: Normal rate, regular rhythm and normal heart sounds.  No murmur heard. Pulmonary/Chest: Effort normal and breath sounds normal. No respiratory  distress. She has no wheezes. She has no rales. She exhibits no tenderness.  Musculoskeletal: She exhibits tenderness. She exhibits no edema.       Arms: Neurological: She is alert and oriented to person, place, and time.  Psychiatric: She has a normal mood and affect.  Nursing note and vitals reviewed.   BP 122/70 (BP Location: Left Arm, Cuff Size: Normal)   Pulse 98   Temp 98.2 F (36.8 C) (Oral)   Resp 16   Ht '4\' 11"'$  (1.499 m)   Wt 124 lb 12.8 oz (56.6 kg)   SpO2 95%   BMI 25.21 kg/m  Wt Readings from Last 3 Encounters:  05/18/17 124 lb 12.8 oz (56.6 kg)  05/10/17 126 lb 9.6 oz (57.4 kg)  02/08/17 127 lb (57.6 kg)   BP Readings from Last 3 Encounters:  05/18/17 122/70  05/10/17 108/78  02/08/17 122/78     Immunization History  Administered Date(s) Administered  . DTaP 01/24/1979  . Influenza Whole 01/22/2010, 02/05/2012  . Influenza, High Dose Seasonal PF 01/29/2017  . Influenza,inj,Quad PF,6+ Mos 02/19/2014  . Influenza-Unspecified 01/17/2015, 01/28/2016  . Pneumococcal Conjugate-13 07/08/2015  . Pneumococcal Polysaccharide-23 01/05/2009, 02/19/2014  . Td 01/24/2007  . Zoster 02/16/2012    Health Maintenance  Topic Date Due  . COLONOSCOPY  07/04/2000  . MAMMOGRAM  02/20/2016  . TETANUS/TDAP  01/23/2017  . OPHTHALMOLOGY EXAM  01/28/2017  . FOOT EXAM  02/06/2017  . HEMOGLOBIN A1C  08/08/2017  . URINE MICROALBUMIN  02/08/2018  . PNA vac Low Risk Adult (2 of 2 - PPSV23) 02/20/2019  . INFLUENZA VACCINE  Completed  . DEXA SCAN  Completed  . Hepatitis C Screening  Completed    Lab Results  Component Value Date   WBC 6.3 05/10/2017   HGB 13.3 05/10/2017   HCT 40.0 05/10/2017   PLT 235.0 05/10/2017   GLUCOSE 288 (H) 05/10/2017   CHOL 151 02/08/2017   TRIG 144.0 02/08/2017   HDL 42.50 02/08/2017   LDLDIRECT 162.0 03/28/2015   LDLCALC 80 02/08/2017   ALT 25 05/10/2017   AST 27 05/10/2017   NA 135 05/10/2017   K 3.9 05/10/2017   CL 94 (L) 05/10/2017    CREATININE 0.64 05/10/2017   BUN 15 05/10/2017   CO2 32 05/10/2017   TSH 0.20 (L) 02/08/2017   HGBA1C 8.8 (H) 02/08/2017   MICROALBUR 0.8 02/08/2017    Lab Results  Component Value Date   TSH 0.20 (L) 02/08/2017   Lab Results  Component Value Date   WBC 6.3 05/10/2017   HGB 13.3 05/10/2017   HCT 40.0 05/10/2017   MCV 93.1 05/10/2017   PLT 235.0 05/10/2017   Lab Results  Component Value Date   NA 135 05/10/2017   K 3.9 05/10/2017   CO2 32 05/10/2017   GLUCOSE 288 (H) 05/10/2017   BUN 15 05/10/2017   CREATININE 0.64 05/10/2017   BILITOT 0.5 05/10/2017   ALKPHOS 99 05/10/2017   AST 27 05/10/2017   ALT 25 05/10/2017   PROT 8.8 (H) 05/10/2017   ALBUMIN 4.0 05/10/2017   CALCIUM 10.2 05/10/2017   GFR 98.42 05/10/2017   Lab Results  Component Value Date   CHOL 151 02/08/2017   Lab Results  Component Value Date   HDL 42.50 02/08/2017   Lab Results  Component Value Date   LDLCALC 80 02/08/2017   Lab Results  Component Value Date   TRIG 144.0 02/08/2017   Lab Results  Component Value Date   CHOLHDL 4 02/08/2017   Lab Results  Component Value Date   HGBA1C 8.8 (H) 02/08/2017         Assessment & Plan:   Problem List Items Addressed This Visit      Unprioritized   Abdominal pain   Relevant Orders   CT RENAL STONE STUDY   DM (diabetes mellitus) type II uncontrolled, periph vascular disorder (HCC)   Relevant Medications   Dulaglutide (TRULICITY) 5.62 BW/3.8LH SOPN    Other Visit Diagnoses    Left flank pain    -  Primary   Relevant Orders   CT RENAL STONE STUDY   POCT Urinalysis Dipstick (Automated) (Completed)    strain urine  Pt has pain  meds at home if needed Ct scan If pain with pain meds go to ER  Pt saw ortho-- Dr Patrice Paradise and pain is not from her back   I have discontinued Alannah A. Masullo's glimepiride. I am also having her start on Dulaglutide. Additionally, I am having her maintain her Insulin Pen Needle, traMADol, pantoprazole,  levocetirizine, ONE TOUCH ULTRA 2, rOPINIRole, clotrimazole-betamethasone, insulin aspart, LANTUS SOLOSTAR, ALPRAZolam, FLUoxetine, glucose blood, levothyroxine, hydrochlorothiazide, and amoxicillin-clavulanate. We will continue to administer ipratropium-albuterol.  Meds ordered this encounter  Medications  . Dulaglutide (TRULICITY) 7.41 SE/3.9RV SOPN    Sig: Inject weekly    Dispense:  4 pen    Refill:  3    CMA served as scribe during this visit. History, Physical and Plan performed by medical provider. Documentation and orders reviewed and attested to.  [name], DO

## 2017-05-19 DIAGNOSIS — R1084 Generalized abdominal pain: Secondary | ICD-10-CM | POA: Diagnosis not present

## 2017-05-19 DIAGNOSIS — M5489 Other dorsalgia: Secondary | ICD-10-CM | POA: Diagnosis not present

## 2017-05-19 DIAGNOSIS — N139 Obstructive and reflux uropathy, unspecified: Secondary | ICD-10-CM | POA: Diagnosis not present

## 2017-05-19 DIAGNOSIS — R350 Frequency of micturition: Secondary | ICD-10-CM | POA: Diagnosis not present

## 2017-05-20 DIAGNOSIS — G4733 Obstructive sleep apnea (adult) (pediatric): Secondary | ICD-10-CM | POA: Diagnosis not present

## 2017-05-21 ENCOUNTER — Other Ambulatory Visit: Payer: Self-pay | Admitting: Family Medicine

## 2017-05-21 ENCOUNTER — Telehealth: Payer: Self-pay | Admitting: Family Medicine

## 2017-05-21 DIAGNOSIS — E1151 Type 2 diabetes mellitus with diabetic peripheral angiopathy without gangrene: Secondary | ICD-10-CM

## 2017-05-21 DIAGNOSIS — IMO0002 Reserved for concepts with insufficient information to code with codable children: Secondary | ICD-10-CM

## 2017-05-21 DIAGNOSIS — E1165 Type 2 diabetes mellitus with hyperglycemia: Principal | ICD-10-CM

## 2017-05-21 MED ORDER — DULAGLUTIDE 0.75 MG/0.5ML ~~LOC~~ SOAJ: SUBCUTANEOUS | 3 refills | Status: DC

## 2017-05-21 NOTE — Telephone Encounter (Signed)
Copied from [place] [id number]. Topic: General - Other >> May 21, 2017  2:59 PM Darl Householder, RMA wrote: Reason for CRM: patient is requesting a callback from Dr. Cheri Rous or CMA concerning new insulin medication wants to know how to take it with current diabetes medication

## 2017-05-21 NOTE — Telephone Encounter (Signed)
Do patient need to continue her other rxs Novolog and Lantus with the Trulicity?

## 2017-05-21 NOTE — Telephone Encounter (Signed)
YES -- CONT MED

## 2017-05-24 ENCOUNTER — Telehealth: Payer: Self-pay | Admitting: Family Medicine

## 2017-05-24 NOTE — Telephone Encounter (Signed)
Anything you would like to do for her

## 2017-05-24 NOTE — Telephone Encounter (Signed)
Copied from [place]. Topic: Quick Communication - See Telephone Encounter >> May 24, 2017 11:22 AM Boyd Kerbs wrote: CRM for notification. See Telephone encounter for:   Pt. Says she is still running a fever from 99 to 101.7 and has been to a lot of doctors and taking medications, and still has fever.  She is asking if you could order labs and see if anything shows up  05/24/17.

## 2017-05-24 NOTE — Telephone Encounter (Signed)
Will speak with patient at [facility] tomorrow

## 2017-05-24 NOTE — Telephone Encounter (Signed)
Patient will be in tomorrow

## 2017-05-24 NOTE — Telephone Encounter (Signed)
Cbcd, cmp, esr, blood cultures x 2 UA She should have ov as well

## 2017-05-25 ENCOUNTER — Ambulatory Visit (INDEPENDENT_AMBULATORY_CARE_PROVIDER_SITE_OTHER): Payer: PPO | Admitting: Family Medicine

## 2017-05-25 ENCOUNTER — Encounter: Payer: Self-pay | Admitting: Family Medicine

## 2017-05-25 VITALS — BP 110/72 | HR 97 | Temp 98.2°F | Resp 16 | Ht 59.0 in | Wt 126.4 lb

## 2017-05-25 DIAGNOSIS — R509 Fever, unspecified: Secondary | ICD-10-CM

## 2017-05-25 DIAGNOSIS — R51 Headache: Secondary | ICD-10-CM | POA: Diagnosis not present

## 2017-05-25 DIAGNOSIS — F419 Anxiety disorder, unspecified: Secondary | ICD-10-CM | POA: Diagnosis not present

## 2017-05-25 DIAGNOSIS — R519 Headache, unspecified: Secondary | ICD-10-CM

## 2017-05-25 LAB — POC URINALSYSI DIPSTICK (AUTOMATED)
BILIRUBIN UA: NEGATIVE
Blood, UA: NEGATIVE
Leukocytes, UA: NEGATIVE
Nitrite, UA: NEGATIVE
PROTEIN UA: NEGATIVE
Spec Grav, UA: 1.025 (ref 1.010–1.025)
Urobilinogen, UA: 0.2 E.U./dL
pH, UA: 6 (ref 5.0–8.0)

## 2017-05-25 MED ORDER — ALPRAZOLAM 0.5 MG PO TABS: ORAL_TABLET | ORAL | 2 refills | Status: DC

## 2017-05-25 MED ORDER — KETOROLAC TROMETHAMINE 60 MG/2ML IM SOLN
60.0000 mg | Freq: Once | INTRAMUSCULAR | Status: AC
Start: 2017-05-25 — End: 2017-05-25
  Administered 2017-05-25: 60 mg via INTRAMUSCULAR

## 2017-05-25 MED ORDER — LEVOFLOXACIN 500 MG PO TABS
500.0000 mg | ORAL_TABLET | Freq: Every day | ORAL | 0 refills | Status: DC
Start: 2017-05-25 — End: 2017-06-22

## 2017-05-25 NOTE — Progress Notes (Signed)
Patient ID: [name], female    DOB: 1951/01/07  Age: 67 y.o. MRN: [id number]    Subjective:  Subjective  HPI [name] presents for fever 100 and congestion, sinus headache.    Review of Systems  Constitutional: Positive for chills, fatigue and fever. Negative for activity change, appetite change and unexpected weight change.  HENT: Positive for congestion, postnasal drip, rhinorrhea, sinus pressure, sinus pain and sore throat.   Respiratory: Negative for cough and shortness of breath.   Cardiovascular: Negative for chest pain and palpitations.  Neurological: Positive for headaches. Negative for dizziness and light-headedness.  Psychiatric/Behavioral: Negative for behavioral problems and dysphoric mood. The patient is not nervous/anxious.     History Past Medical History:  Diagnosis Date  . Arthritis   . Diabetes mellitus   . Thyroid disease     She has a past surgical history that includes Cholecystectomy; Abdominal hysterectomy; Back surgery; Tonsilectomy, adenoidectomy, bilateral myringotomy and tubes; and Thyroid surgery.   Her family history includes Alcohol abuse in her mother; Breast cancer in her cousin and daughter; Heart disease in her maternal grandfather; Stroke in her maternal grandmother and mother.She reports that she has been smoking.  She has smoked for the past 48.00 years. she has never used smokeless tobacco. She reports that she does not drink alcohol or use drugs.  Current Outpatient Medications on File Prior to Visit  Medication Sig Dispense Refill  . amoxicillin-clavulanate (AUGMENTIN) 875-125 MG tablet Take 1 tablet by mouth 2 (two) times daily. 20 tablet 0  . Blood Glucose Monitoring Suppl (ONE TOUCH ULTRA 2) w/Device KIT Check blood sugar as directed. Dx:E11.65 1 each 0  . clotrimazole-betamethasone (LOTRISONE) cream Apply 1 application topically 2 (two) times daily. 30 g 0  . Dulaglutide (TRULICITY) 9.32 TF/5.7DU SOPN Inject weekly 4 pen 3  .  FLUoxetine (PROZAC) 20 MG capsule Take 1 capsule (20 mg total) by mouth daily. 30 capsule 5  . glucose blood (ONE TOUCH ULTRA TEST) test strip Check blood sugar 4 times per day.E11.65 400 each 2  . hydrochlorothiazide (HYDRODIURIL) 25 MG tablet TAKE ONE TABLET BY MOUTH DAILY 81 tablet 0  . insulin aspart (NOVOLOG FLEXPEN) 100 UNIT/ML FlexPen Inject 10 Units into the skin 2 (two) times daily. Before breakfast and before dinner 15 mL 11  . Insulin Pen Needle (NOVOTWIST) 32G X 5 MM MISC Use daily at Bedtime with Lantus 100 each 0  . LANTUS SOLOSTAR 100 UNIT/ML Solostar Pen INJECT 25 UNITS INTO THE SKIN AT BEDTIME 15 pen 10  . levocetirizine (XYZAL) 5 MG tablet Take 1 tablet (5 mg total) by mouth every evening. 30 tablet 5  . levothyroxine (SYNTHROID, LEVOTHROID) 125 MCG tablet TAKE ONE TABLET BY MOUTH DAILY BEFORE BREAKFAST 13 tablet 0  . pantoprazole (PROTONIX) 40 MG tablet Take 1 tablet (40 mg total) by mouth daily. 30 tablet 11  . rOPINIRole (REQUIP) 0.25 MG tablet Take 1 by mouth at night for 2 days, then increase to 2 at night 60 tablet 0  . traMADol (ULTRAM) 50 MG tablet Take 1 tablet (50 mg total) by mouth every 8 (eight) hours as needed for pain. 30 tablet 0   Current Facility-Administered Medications on File Prior to Visit  Medication Dose Route Frequency Provider Last Rate Last Dose  . ipratropium-albuterol (DUONEB) 0.5-2.5 (3) MG/3ML nebulizer solution 3 mL  3 mL Nebulization Q6H Lowne Chase, Kendrick Fries R, DO         Objective:  Objective  Physical Exam  Constitutional: She is oriented to person, place, and time. She appears well-developed and well-nourished.  HENT:  Right Ear: Decreased hearing is noted.  Left Ear: External ear normal. Decreased hearing is noted.  Ears:  Nose: Right sinus exhibits maxillary sinus tenderness and frontal sinus tenderness. Left sinus exhibits maxillary sinus tenderness and frontal sinus tenderness.  Mouth/Throat: Posterior oropharyngeal erythema present.  No oropharyngeal exudate or posterior oropharyngeal edema.  + PND + errythema  Eyes: Conjunctivae are normal. Right eye exhibits no discharge. Left eye exhibits no discharge.  Cardiovascular: Normal rate, regular rhythm and normal heart sounds.  No murmur heard. Pulmonary/Chest: Effort normal and breath sounds normal. No respiratory distress. She has no wheezes. She has no rales. She exhibits no tenderness.  Musculoskeletal: She exhibits no edema.  Lymphadenopathy:    She has cervical adenopathy.  Neurological: She is alert and oriented to person, place, and time.  Nursing note and vitals reviewed.  BP 110/72 (BP Location: Right Arm, Cuff Size: Normal)   Pulse 97   Temp 98.2 F (36.8 C) (Oral)   Resp 16   Ht '4\' 11"'$  (1.499 m)   Wt 126 lb 6.4 oz (57.3 kg)   SpO2 92%   BMI 25.53 kg/m  Wt Readings from Last 3 Encounters:  05/25/17 126 lb 6.4 oz (57.3 kg)  05/18/17 124 lb 12.8 oz (56.6 kg)  05/10/17 126 lb 9.6 oz (57.4 kg)     Lab Results  Component Value Date   WBC 6.3 05/10/2017   HGB 13.3 05/10/2017   HCT 40.0 05/10/2017   PLT 235.0 05/10/2017   GLUCOSE 288 (H) 05/10/2017   CHOL 151 02/08/2017   TRIG 144.0 02/08/2017   HDL 42.50 02/08/2017   LDLDIRECT 162.0 03/28/2015   LDLCALC 80 02/08/2017   ALT 25 05/10/2017   AST 27 05/10/2017   NA 135 05/10/2017   K 3.9 05/10/2017   CL 94 (L) 05/10/2017   CREATININE 0.64 05/10/2017   BUN 15 05/10/2017   CO2 32 05/10/2017   TSH 0.20 (L) 02/08/2017   HGBA1C 8.8 (H) 02/08/2017   MICROALBUR 0.8 02/08/2017    Ct Head Wo Contrast  Result Date: 05/10/2017 CLINICAL DATA:  Frontal headache for 1 week, cold symptoms, possible sinus infection, history diabetes mellitus, thyroid disease EXAM: CT HEAD WITHOUT CONTRAST TECHNIQUE: Contiguous axial images were obtained from the base of the skull through the vertex without intravenous contrast. Sagittal and coronal MPR images reconstructed from axial data set. COMPARISON:  None FINDINGS:  Brain: Normal ventricular morphology. No midline shift or mass effect. Normal appearance of brain parenchyma. No intracranial hemorrhage, mass lesion, evidence of acute infarction, or extra-axial fluid collection. Vascular: Normal appearance Skull: Intact Sinuses/Orbits: Clear Other: N/A IMPRESSION: Normal exam. Electronically Signed   By: Lavonia Dana M.D.   On: 05/10/2017 15:27     Assessment & Plan:  Plan  I am having Ruble A. Grant start on levofloxacin. I am also having her maintain her Insulin Pen Needle, traMADol, pantoprazole, levocetirizine, ONE TOUCH ULTRA 2, rOPINIRole, clotrimazole-betamethasone, insulin aspart, LANTUS SOLOSTAR, FLUoxetine, glucose blood, levothyroxine, hydrochlorothiazide, amoxicillin-clavulanate, Dulaglutide, and ALPRAZolam. We administered ketorolac. We will continue to administer ipratropium-albuterol.  Meds ordered this encounter  Medications  . levofloxacin (LEVAQUIN) 500 MG tablet    Sig: Take 1 tablet (500 mg total) by mouth daily.    Dispense:  7 tablet    Refill:  0  . DISCONTD: ALPRAZolam (XANAX) 0.5 MG tablet    Sig: TAKE ONE TABLET BY MOUTH THREE  TIMES A DAY AS NEEDED FOR ANXIETY OR SLEEP    Dispense:  30 tablet    Refill:  2  . ALPRAZolam (XANAX) 0.5 MG tablet    Sig: TAKE ONE TABLET BY MOUTH THREE TIMES A DAY AS NEEDED FOR ANXIETY OR SLEEP    Dispense:  30 tablet    Refill:  2  . ketorolac (TORADOL) injection 60 mg    Problem List Items Addressed This Visit      Unprioritized   Headache above the eye region   Relevant Medications   ketorolac (TORADOL) injection 60 mg (Completed)   Other Relevant Orders   TSH   POCT Urinalysis Dipstick (Automated)   Sedimentation rate   CBC with Differential/Platelet   DG Chest 2 View   Comprehensive metabolic panel   Amylase   Lipase   Sinus headache   Relevant Medications   levofloxacin (LEVAQUIN) 500 MG tablet   ketorolac (TORADOL) injection 60 mg (Completed)    Other Visit Diagnoses    Fever  and chills    -  Primary   Relevant Orders   TSH   POCT Urinalysis Dipstick (Automated)   Sedimentation rate   CBC with Differential/Platelet   DG Chest 2 View   Comprehensive metabolic panel   Amylase   Lipase   Culture, blood (single)   Culture, blood (single)   Anxiety       Relevant Medications   ALPRAZolam (XANAX) 0.5 MG tablet      Follow-up: Return if symptoms worsen or fail to improve.   If headache worsens rto or go to [facility], DO

## 2017-05-25 NOTE — Telephone Encounter (Signed)
Patient notified

## 2017-05-25 NOTE — Patient Instructions (Signed)
Sinus Headache A sinus headache occurs when the paranasal sinuses become clogged or swollen. Paranasal sinuses are air pockets within the bones of the face. Sinus headaches can range from mild to severe. What are the causes? A sinus headache can result from various conditions that affect the sinuses, such as:  Colds.  Sinus infections.  Allergies.  What are the signs or symptoms? The main symptom of this condition is a headache that may feel like pain or pressure in the face, forehead, ears, or upper teeth. People who have a sinus headache often have other symptoms, such as:  Congested or runny nose.  Fever.  Inability to smell.  Weather changes can make symptoms worse. How is this diagnosed? This condition may be diagnosed based on:  A physical exam and medical history.  Imaging tests, such as a CT scan and MRI, to check for problems with the sinuses.  A specialist may look into the sinuses with a tool that has a camera (endoscopy).  How is this treated? Treatment for this condition depends on the cause.  Sinus pain that is caused by a sinus infection may be treated with antibiotic medicine.  Sinus pain that is caused by allergies may be helped by allergy medicines (antihistamines) and medicated nasal sprays.  Sinus pain that is caused by congestion may be helped by flushing the nose and sinuses with saline solution.  Follow these instructions at home:  Take medicines only as directed by your health care provider.  If you were prescribed an antibiotic medicine, finish all of it even if you start to feel better.  If you have congestion, use a nasal spray to help reduce pressure.  If directed, apply a warm, moist washcloth to your face to help relieve pain. Contact a health care provider if:  You have headaches more than one time each week.  You have sensitivity to light or sound.  You have a fever.  You feel sick to your stomach (nauseous) or you throw up  (vomit).  Your headaches do not get better with treatment. Many people think that they have a sinus headache when they actually have migraines or tension headaches. Get help right away if:  You have vision problems.  You have sudden, severe pain in your face or head.  You have a seizure.  You are confused.  You have a stiff neck. This information is not intended to replace advice given to you by your health care provider. Make sure you discuss any questions you have with your health care provider. Document Released: 04/30/2004 Document Revised: 11/17/2015 Document Reviewed: 03/19/2014 Elsevier Interactive Patient Education  2018 [employer] Inc[employer]  

## 2017-05-26 LAB — AMYLASE: Amylase: 35 U/L (ref 27–131)

## 2017-05-26 LAB — CBC WITH DIFFERENTIAL/PLATELET
BASOS ABS: 0 10*3/uL (ref 0.0–0.1)
Basophils Relative: 0.5 % (ref 0.0–3.0)
Eosinophils Absolute: 0.1 10*3/uL (ref 0.0–0.7)
Eosinophils Relative: 1.9 % (ref 0.0–5.0)
HCT: 36 % (ref 36.0–46.0)
Hemoglobin: 11.8 g/dL — ABNORMAL LOW (ref 12.0–15.0)
LYMPHS ABS: 1.5 10*3/uL (ref 0.7–4.0)
Lymphocytes Relative: 19.4 % (ref 12.0–46.0)
MCHC: 32.9 g/dL (ref 30.0–36.0)
MCV: 92.3 fl (ref 78.0–100.0)
MONOS PCT: 12.3 % — AB (ref 3.0–12.0)
Monocytes Absolute: 0.9 10*3/uL (ref 0.1–1.0)
NEUTROS PCT: 65.9 % (ref 43.0–77.0)
Neutro Abs: 4.9 10*3/uL (ref 1.4–7.7)
Platelets: 298 10*3/uL (ref 150.0–400.0)
RBC: 3.9 Mil/uL (ref 3.87–5.11)
RDW: 13.7 % (ref 11.5–15.5)
WBC: 7.5 10*3/uL (ref 4.0–10.5)

## 2017-05-26 LAB — LIPASE: Lipase: 18 U/L (ref 11.0–59.0)

## 2017-05-26 LAB — TSH: TSH: 0.49 u[IU]/mL (ref 0.35–4.50)

## 2017-05-26 LAB — COMPREHENSIVE METABOLIC PANEL
ALK PHOS: 71 U/L (ref 39–117)
ALT: 15 U/L (ref 0–35)
AST: 19 U/L (ref 0–37)
Albumin: 3.4 g/dL — ABNORMAL LOW (ref 3.5–5.2)
BILIRUBIN TOTAL: 0.3 mg/dL (ref 0.2–1.2)
BUN: 13 mg/dL (ref 6–23)
CO2: 30 mEq/L (ref 19–32)
Calcium: 9.7 mg/dL (ref 8.4–10.5)
Chloride: 98 mEq/L (ref 96–112)
Creatinine, Ser: 0.55 mg/dL (ref 0.40–1.20)
GFR: 117.22 mL/min (ref >60.00)
GLUCOSE: 340 mg/dL — AB (ref 70–99)
Potassium: 4.2 mEq/L (ref 3.5–5.1)
SODIUM: 135 meq/L (ref 135–145)
TOTAL PROTEIN: 8 g/dL (ref 6.0–8.3)

## 2017-05-26 LAB — SEDIMENTATION RATE: Sed Rate: 23 mm/hr (ref 0–30)

## 2017-05-29 ENCOUNTER — Other Ambulatory Visit: Payer: Self-pay | Admitting: Family Medicine

## 2017-05-31 LAB — CULTURE, BLOOD (SINGLE)
MICRO NUMBER: 90218289
MICRO NUMBER:: 90218290
RESULT: NO GROWTH
Result:: NO GROWTH
SPECIMEN QUALITY: ADEQUATE
SPECIMEN QUALITY:: ADEQUATE

## 2017-06-17 DIAGNOSIS — G4733 Obstructive sleep apnea (adult) (pediatric): Secondary | ICD-10-CM | POA: Diagnosis not present

## 2017-06-20 ENCOUNTER — Other Ambulatory Visit: Payer: Self-pay | Admitting: Family Medicine

## 2017-06-20 DIAGNOSIS — E119 Type 2 diabetes mellitus without complications: Secondary | ICD-10-CM

## 2017-06-20 DIAGNOSIS — Z794 Long term (current) use of insulin: Principal | ICD-10-CM

## 2017-06-28 ENCOUNTER — Other Ambulatory Visit: Payer: Self-pay | Admitting: Family Medicine

## 2017-06-29 DIAGNOSIS — G4733 Obstructive sleep apnea (adult) (pediatric): Secondary | ICD-10-CM | POA: Diagnosis not present

## 2017-07-07 DIAGNOSIS — E1165 Type 2 diabetes mellitus with hyperglycemia: Secondary | ICD-10-CM | POA: Diagnosis not present

## 2017-07-07 DIAGNOSIS — Z794 Long term (current) use of insulin: Secondary | ICD-10-CM | POA: Diagnosis not present

## 2017-07-15 ENCOUNTER — Other Ambulatory Visit: Payer: Self-pay | Admitting: Family Medicine

## 2017-07-18 DIAGNOSIS — G4733 Obstructive sleep apnea (adult) (pediatric): Secondary | ICD-10-CM | POA: Diagnosis not present

## 2017-07-28 ENCOUNTER — Encounter: Payer: Self-pay | Admitting: Nurse Practitioner

## 2017-07-28 NOTE — Progress Notes (Addendum)
[facility]  PATIENT: [name] DOB: 1951/03/09   REASON FOR VISIT:  Follow-up for  Obstructive sleep apnea,   CPAP compliance HISTORY FROM:patient    HISTORY OF PRESENT ILLNESS:UPDATE 4/26/2019CM [name] 67 year old female returns for follow-up, with a history of obstructive sleep apnea on CPAP.  She has had more difficulty with her CPAP since she had back surgery in November.  She claims she wakes up at night and takes the mask off when she is in pain.  CPAP compliance dated 06/29/2017-07/28/2017 shows compliance greater than 4 hours 83%.  Average usage 6 hours 25 minutes.  Set pressure 9 cm.  EPR level 3.  AHI 3 ESS 1.  She returns for reevaluation   UPDATE  10/25/2018CM  [name]. 4, 67 year old female returns for follow-up.  Sleep study results found obstructive sleep apnea and she was started on CPAP with [facility] on 12/18/2016.  Patient requested a sooner appointment because of pending  back surgery.  CPAP compliance data dated 12/29/2016- 01/27/2017 shows 100%  compliance greater than 4 hours  For 30 days.  Average usage 7 hours 55  Minutes. Set pressure 9 cm. EPR 3. AHI 3.9 She claims she feels so much better, less fatigue. ESS 2. FSS 9. She returns for reevaluation.  10/12/16 CDI have pleasure of seeing [name] today again, on 10/12/2016 I had previously seen her in 2017 and she was scheduled for a sleep study. A polysomnography was performed on 08/09/2015. Introducing the patient as a female with a BMI of 25, an Epworth sleepiness score of 18 points, with arthritis, diabetes, thyroid disease and status post UPPP. The patient's AHI was only 5.1 very low but exacerbated during REM sleep to 27.1 the patient also had some prolonged oxygen desaturations 107 minutes between or at 89% S/P OT. She had mild periodic limb movements. The impression was that she did not have significant apnea but hypoxemia. 4 snoring therapy I suggested an oral appliance and the patient was  referred to Dr. caps were then confirmed that because of her UPPP she was not a candidate for a dental device. He performed a home sleep test on 08/24/2016, which revealed only 26 minutes of at or below 88% oxygen saturation, a respiratory disturbance index of 11 per hour, and a pulse variation between 40 and 132 bpm. This again confirms have mild apnea but no longer hypoxemia to be present the RDI was 33.8 still indicating that this patient snores. In spite of the UPPP the only treatment option that would treat snoring in a patient after UPPP would be use of his CPAP. She is no longer as sleepy as she was last year with an Epworth sleepiness score at 4 and the fatigue severity of only 9 points and she is not depressed. Since she cannot have a dental device I would now try to perform a CPAP titration.      REVIEW OF SYSTEMS: Full 14 system review of systems performed and notable only for those listed, all others are neg:  Constitutional: neg  Cardiovascular: neg Ear/Nose/Throat: neg  Skin: neg Eyes: neg Respiratory: neg Gastroitestinal: neg  Hematology/Lymphatic: neg  Endocrine: neg Musculoskeletal: back  Surgery in November 2018 Allergy/Immunology: neg Neurological: neg Psychiatric: neg Sleep : obstructive sleep apnea with CPAP   ALLERGIES: No Known Allergies  HOME MEDICATIONS: Outpatient Medications Prior to Visit  Medication Sig Dispense Refill  . ALPRAZolam (XANAX) 0.5 MG tablet TAKE ONE TABLET BY MOUTH THREE TIMES A DAY AS  NEEDED FOR ANXIETY OR SLEEP 30 tablet 2  . Blood Glucose Monitoring Suppl (ONE TOUCH ULTRA 2) w/Device KIT Check blood sugar as directed. Dx:E11.65 1 each 0  . Dulaglutide (TRULICITY) 0.75 MG/0.5ML SOPN Inject weekly 4 pen 3  . FLUoxetine (PROZAC) 20 MG capsule Take 1 capsule (20 mg total) by mouth daily. 30 capsule 5  . glimepiride (AMARYL) 4 MG tablet TAKE ONE TABLET BY MOUTH DAILY WITH BREAKFAST 90 tablet 0  . glucose blood (ONE TOUCH ULTRA TEST) test  strip Check blood sugar 4 times per day.E11.65 400 each 2  . hydrochlorothiazide (HYDRODIURIL) 25 MG tablet Take 1 tablet (25 mg total) by mouth daily. 90 tablet 0  . Insulin Pen Needle (NOVOTWIST) 32G X 5 MM MISC Use daily at Bedtime with Lantus 100 each 0  . LANTUS SOLOSTAR 100 UNIT/ML Solostar Pen INJECT 25 UNITS INTO THE SKIN AT BEDTIME 15 pen 10  . levocetirizine (XYZAL) 5 MG tablet Take 1 tablet (5 mg total) by mouth every evening. 30 tablet 5  . levothyroxine (SYNTHROID, LEVOTHROID) 137 MCG tablet TAKE ONE TABLET BY MOUTH EVERY MORNING BEFORE BREAKFAST 90 tablet 1  . pantoprazole (PROTONIX) 40 MG tablet Take 1 tablet (40 mg total) by mouth daily. 30 tablet 11  . traMADol (ULTRAM) 50 MG tablet Take 1 tablet (50 mg total) by mouth every 8 (eight) hours as needed for pain. 30 tablet 0  . amoxicillin-clavulanate (AUGMENTIN) 875-125 MG tablet Take 1 tablet by mouth 2 (two) times daily. 20 tablet 0  . clotrimazole-betamethasone (LOTRISONE) cream Apply 1 application topically 2 (two) times daily. 30 g 0  . insulin aspart (NOVOLOG FLEXPEN) 100 UNIT/ML FlexPen Inject 10 Units into the skin 2 (two) times daily. Before breakfast and before dinner 15 mL 11  . rOPINIRole (REQUIP) 0.25 MG tablet Take 1 by mouth at night for 2 days, then increase to 2 at night 60 tablet 0   Facility-Administered Medications Prior to Visit  Medication Dose Route Frequency Provider Last Rate Last Dose  . ipratropium-albuterol (DUONEB) 0.5-2.5 (3) MG/3ML nebulizer solution 3 mL  3 mL Nebulization Q6H Lowne Chase, Yvonne R, DO        PAST MEDICAL HISTORY: Past Medical History:  Diagnosis Date  . Arthritis   . Diabetes mellitus   . OSA on CPAP   . Thyroid disease     PAST SURGICAL HISTORY: Past Surgical History:  Procedure Laterality Date  . ABDOMINAL HYSTERECTOMY    . BACK SURGERY     x3  . BACK SURGERY     x 5 , 2 neck surgeries, 2 lower back surgeries  . CHOLECYSTECTOMY    . THYROID SURGERY    .  TONSILECTOMY, ADENOIDECTOMY, BILATERAL MYRINGOTOMY AND TUBES      FAMILY HISTORY: Family History  Problem Relation Age of Onset  . Stroke Mother   . Alcohol abuse Mother   . Stroke Maternal Grandmother   . Heart disease Maternal Grandfather   . Breast cancer Cousin   . Breast cancer Daughter     SOCIAL HISTORY: Social History   Socioeconomic History  . Marital status: Married    Spouse name: Not on file  . Number of children: Not on file  . Years of education: Not on file  . Highest education level: Not on file  Occupational History  . Occupation: [employer]    Employer: [employer]  Social Needs  . Financial resource strain: Not on file  . Food insecurity:      Worry: Not on file    Inability: Not on file  . Transportation needs:    Medical: Not on file    Non-medical: Not on file  Tobacco Use  . Smoking status: Former Smoker    Years: 48.00    Last attempt to quit: 02/01/2017    Years since quitting: 0.4  . Smokeless tobacco: Never Used  Substance and Sexual Activity  . Alcohol use: No    Alcohol/week: 0.0 oz  . Drug use: No  . Sexual activity: Yes    Partners: Male    Comment: 2 -3 days a week   1 pack lasts 2 weeks  Lifestyle  . Physical activity:    Days per week: Not on file    Minutes per session: Not on file  . Stress: Not on file  Relationships  . Social connections:    Talks on phone: Not on file    Gets together: Not on file    Attends religious service: Not on file    Active member of club or organization: Not on file    Attends meetings of clubs or organizations: Not on file    Relationship status: Not on file  . Intimate partner violence:    Fear of current or ex partner: Not on file    Emotionally abused: Not on file    Physically abused: Not on file    Forced sexual activity: Not on file  Other Topics Concern  . Not on file  Social History Narrative   Exercise--  3 flts steps a day for 8 hours     PHYSICAL  EXAM  Vitals:   07/30/17 1044  BP: 104/70  Pulse: (!) 101  Weight: 124 lb (56.2 kg)  Height: 4' 11" (1.499 m)   Body mass index is 25.04 kg/m.  Generalized: Well developed, in no acute distress  Head: normocephalic and atraumatic,. Oropharynx benign mallopati 4 Neck: Supple, circumference 15 inches Musculoskeletal: No deformity   Neurological examination   Mentation: Alert oriented to time, place, history taking. Attention span and concentration appropriate. Recent and remote memory intact.  Follows all commands speech and language fluent.ESS 1.   Cranial nerve II-XII: Pupils were equal round reactive to light extraocular movements were full, visual field were full on confrontational test. Facial sensation and strength were normal. hearing was intact to finger rubbing bilaterally. Uvula tongue midline. head turning and shoulder shrug were normal and symmetric.Tongue protrusion into cheek strength was normal. Motor: normal bulk and tone, full strength in the BUE, BLE,  Sensory: normal and symmetric to light touch,  Coordination: finger-nose-finger, , no dysmetria Gait and Station: Rising up from seated position without assistance, normal stance,   DIAGNOSTIC DATA (LABS, IMAGING, TESTING) - I reviewed patient records, labs, notes, testing and imaging myself where available.  Lab Results  Component Value Date   WBC 7.5 05/25/2017   HGB 11.8 (L) 05/25/2017   HCT 36.0 05/25/2017   MCV 92.3 05/25/2017   PLT 298.0 05/25/2017      Component Value Date/Time   NA 135 05/25/2017 1611   K 4.2 05/25/2017 1611   CL 98 05/25/2017 1611   CO2 30 05/25/2017 1611   GLUCOSE 340 (H) 05/25/2017 1611   BUN 13 05/25/2017 1611   CREATININE 0.55 05/25/2017 1611   CALCIUM 9.7 05/25/2017 1611   PROT 8.0 05/25/2017 1611   ALBUMIN 3.4 (L) 05/25/2017 1611   AST 19 05/25/2017 1611   ALT 15 05/25/2017 1611   ALKPHOS 71  05/25/2017 1611   BILITOT 0.3 05/25/2017 1611   GFRNONAA >60 10/02/2009 1105    GFRAA  10/02/2009 1105    >60        The eGFR has been calculated using the MDRD equation. This calculation has not been validated in all clinical situations. eGFR's persistently <60 mL/min signify possible Chronic Kidney Disease.   Lab Results  Component Value Date   CHOL 151 02/08/2017   HDL 42.50 02/08/2017   LDLCALC 80 02/08/2017   LDLDIRECT 162.0 03/28/2015   TRIG 144.0 02/08/2017   CHOLHDL 4 02/08/2017   Lab Results  Component Value Date   HGBA1C 8.8 (H) 02/08/2017   No results found for: VITAMINB12 Lab Results  Component Value Date   TSH 0.49 05/25/2017      ASSESSMENT AND PLAN  67 y.o. year old female  has a past medical history of Arthritis; Diabetes mellitus; and Thyroid disease. And obstructive sleep apnea. Here to follow-up for  CPAP compliance. Data dated 06/29/2017-07/28/2017 shows compliance greater than 4 hours 83%.  Average usage 6 hours 25 minutes.  Set pressure 9 cm.  EPR level 3.  AHI 3 ESS 1.  The patient is a current patient of Dr. Dohmeier  who is out of the office today . This note is sent to the work in doctor.      CPAP compliance 83% reviewed data with patient  continue same settings  follow-up in 6 months for repeat compliance I spent 15 min  in total face to face time with the patient more than 50% of which was spent counseling and coordination of care, reviewing test results reviewing medications and discussing and reviewing the diagnosis of obstructive sleep apnea and CPAP compliance,  Carolyn , GNP, BC, APRN  [facility] [address], [address] [place], [place] [address] [phone]  I reviewed the above note and documentation by the Nurse Practitioner and agree with the history, physical exam, assessment and plan as outlined above. I was immediately available for face-to-face consultation. Saima Athar, MD, PhD [facility] ([facility])   

## 2017-07-30 ENCOUNTER — Ambulatory Visit: Payer: PPO | Admitting: Nurse Practitioner

## 2017-07-30 ENCOUNTER — Encounter: Payer: Self-pay | Admitting: Nurse Practitioner

## 2017-07-30 VITALS — BP 104/70 | HR 101 | Ht 59.0 in | Wt 124.0 lb

## 2017-07-30 DIAGNOSIS — Z9989 Dependence on other enabling machines and devices: Secondary | ICD-10-CM

## 2017-07-30 DIAGNOSIS — G4733 Obstructive sleep apnea (adult) (pediatric): Secondary | ICD-10-CM | POA: Diagnosis not present

## 2017-07-30 NOTE — Patient Instructions (Signed)
CPAP compliance 83%  continue same settings  follow-up in 6 months

## 2017-08-03 ENCOUNTER — Telehealth: Payer: Self-pay | Admitting: *Deleted

## 2017-08-03 ENCOUNTER — Other Ambulatory Visit: Payer: Self-pay | Admitting: Family Medicine

## 2017-08-03 MED ORDER — VALACYCLOVIR HCL 1 G PO TABS: 1000.0000 mg | ORAL_TABLET | Freq: Every day | ORAL | 0 refills | Status: DC | PRN

## 2017-08-03 NOTE — Telephone Encounter (Signed)
Patient has a history of fever blisters and Kristopher Oppenheim sent over a fax to request a refill.  I was two fills that we done in the past with 2 different sigs so I was not sure which you would like to refill.

## 2017-08-03 NOTE — Telephone Encounter (Signed)
I sent over a prescription for 1 tab po daily prn so she can take it as needed, #30

## 2017-08-04 NOTE — Telephone Encounter (Signed)
Left detailed message on phone that rx was sent in.

## 2017-08-17 DIAGNOSIS — G4733 Obstructive sleep apnea (adult) (pediatric): Secondary | ICD-10-CM | POA: Diagnosis not present

## 2017-09-01 DIAGNOSIS — M7061 Trochanteric bursitis, right hip: Secondary | ICD-10-CM | POA: Diagnosis not present

## 2017-09-01 DIAGNOSIS — M545 Low back pain: Secondary | ICD-10-CM | POA: Diagnosis not present

## 2017-09-01 DIAGNOSIS — M4326 Fusion of spine, lumbar region: Secondary | ICD-10-CM | POA: Diagnosis not present

## 2017-09-15 ENCOUNTER — Other Ambulatory Visit: Payer: Self-pay | Admitting: Family Medicine

## 2017-09-17 DIAGNOSIS — G4733 Obstructive sleep apnea (adult) (pediatric): Secondary | ICD-10-CM | POA: Diagnosis not present

## 2017-09-28 ENCOUNTER — Other Ambulatory Visit: Payer: Self-pay | Admitting: Family Medicine

## 2017-09-28 DIAGNOSIS — Z794 Long term (current) use of insulin: Principal | ICD-10-CM

## 2017-09-28 DIAGNOSIS — E119 Type 2 diabetes mellitus without complications: Secondary | ICD-10-CM

## 2017-09-30 ENCOUNTER — Encounter: Payer: Self-pay | Admitting: Family Medicine

## 2017-09-30 ENCOUNTER — Ambulatory Visit (INDEPENDENT_AMBULATORY_CARE_PROVIDER_SITE_OTHER): Payer: PPO | Admitting: Family Medicine

## 2017-09-30 VITALS — BP 116/80 | HR 95 | Temp 97.8°F | Resp 16 | Ht 59.0 in | Wt 128.8 lb

## 2017-09-30 DIAGNOSIS — H9311 Tinnitus, right ear: Secondary | ICD-10-CM

## 2017-09-30 DIAGNOSIS — H9202 Otalgia, left ear: Secondary | ICD-10-CM

## 2017-09-30 MED ORDER — OFLOXACIN 0.3 % OT SOLN: 10.0000 [drp] | Freq: Every day | OTIC | 0 refills | Status: DC

## 2017-09-30 NOTE — Patient Instructions (Signed)

## 2017-09-30 NOTE — Progress Notes (Signed)
Patient ID: [name], female   DOB: [birth year], 67 y.o.   MRN: [id number]     Subjective:  I acted as a Education administrator for Dr. Carollee Herter.  Guerry Bruin, [place]   Patient ID: [name], female    DOB: Feb 18, 1951, 67 y.o.   MRN: [id number]  Chief Complaint  Patient presents with  . Otalgia    left ear    HPI  Patient is in today for left ear pain x several days.  She also c/o ringing in R ear.  She also has some dizziness and has no other complaints.    Patient Care Team: Carollee Herter, Alferd Apa, DO as PCP - General (Family Medicine) Starling Manns, MD (Orthopedic Surgery) [facility], [facility]   Past Medical History:  Diagnosis Date  . Arthritis   . Diabetes mellitus   . OSA on CPAP   . Thyroid disease     Past Surgical History:  Procedure Laterality Date  . ABDOMINAL HYSTERECTOMY    . BACK SURGERY     x3  . BACK SURGERY     x 5 , 2 neck surgeries, 2 lower back surgeries  . CHOLECYSTECTOMY    . THYROID SURGERY    . TONSILECTOMY, ADENOIDECTOMY, BILATERAL MYRINGOTOMY AND TUBES      Family History  Problem Relation Age of Onset  . Stroke Mother   . Alcohol abuse Mother   . Stroke Maternal Grandmother   . Heart disease Maternal Grandfather   . Breast cancer Cousin   . Breast cancer Daughter     Social History   Socioeconomic History  . Marital status: Married    Spouse name: Not on file  . Number of children: Not on file  . Years of education: Not on file  . Highest education level: Not on file  Occupational History  . Occupation: [employer] trader: [employer]  . Financial resource strain: Not on file  . Food insecurity:    Worry: Not on file    Inability: Not on file  . Transportation needs:    Medical: Not on file    Non-medical: Not on file  Tobacco Use  . Smoking status: Former Smoker    Years: 48.00    Last attempt to quit: 02/01/2017    Years since quitting: 0.6  . Smokeless tobacco: Never Used  Substance  and Sexual Activity  . Alcohol use: No    Alcohol/week: 0.0 oz  . Drug use: No  . Sexual activity: Yes    Partners: Male    Comment: 2 -3 days a week   1 pack lasts 2 weeks  Lifestyle  . Physical activity:    Days per week: Not on file    Minutes per session: Not on file  . Stress: Not on file  Relationships  . Social connections:    Talks on phone: Not on file    Gets together: Not on file    Attends religious service: Not on file    Active member of club or organization: Not on file    Attends meetings of clubs or organizations: Not on file    Relationship status: Not on file  . Intimate partner violence:    Fear of current or ex partner: Not on file    Emotionally abused: Not on file    Physically abused: Not on file    Forced sexual activity: Not on file  Other Topics Concern  .  Not on file  Social History Narrative   Exercise--  3 flts steps a day for 8 hours    Outpatient Medications Prior to Visit  Medication Sig Dispense Refill  . ALPRAZolam (XANAX) 0.5 MG tablet TAKE ONE TABLET BY MOUTH THREE TIMES A DAY AS NEEDED FOR ANXIETY OR SLEEP 30 tablet 2  . Blood Glucose Monitoring Suppl (ONE TOUCH ULTRA 2) w/Device KIT Check blood sugar as directed. Dx:E11.65 1 each 0  . Dulaglutide (TRULICITY) 8.14 GY/1.8HU SOPN Inject weekly 4 pen 3  . FLUoxetine (PROZAC) 20 MG capsule TAKE ONE CAPSULE BY MOUTH DAILY 30 capsule 4  . glimepiride (AMARYL) 4 MG tablet TAKE ONE TABLET BY MOUTH DAILY WITH BREAKFAST 90 tablet 0  . glucose blood (ONE TOUCH ULTRA TEST) test strip Check blood sugar 4 times per day.E11.65 400 each 2  . hydrochlorothiazide (HYDRODIURIL) 25 MG tablet Take 1 tablet (25 mg total) by mouth daily. 90 tablet 0  . Insulin Pen Needle (NOVOTWIST) 32G X 5 MM MISC Use daily at Bedtime with Lantus 100 each 0  . LANTUS SOLOSTAR 100 UNIT/ML Solostar Pen INJECT 25 UNITS INTO THE SKIN AT BEDTIME 15 pen 10  . levocetirizine (XYZAL) 5 MG tablet Take 1 tablet (5 mg total) by mouth  every evening. 30 tablet 5  . levothyroxine (SYNTHROID, LEVOTHROID) 137 MCG tablet TAKE ONE TABLET BY MOUTH EVERY MORNING BEFORE BREAKFAST 90 tablet 1  . pantoprazole (PROTONIX) 40 MG tablet Take 1 tablet (40 mg total) by mouth daily. 30 tablet 11  . traMADol (ULTRAM) 50 MG tablet Take 1 tablet (50 mg total) by mouth every 8 (eight) hours as needed for pain. 30 tablet 0  . valACYclovir (VALTREX) 1000 MG tablet Take 1 tablet (1,000 mg total) by mouth daily as needed. 30 tablet 0   Facility-Administered Medications Prior to Visit  Medication Dose Route Frequency Provider Last Rate Last Dose  . ipratropium-albuterol (DUONEB) 0.5-2.5 (3) MG/3ML nebulizer solution 3 mL  3 mL Nebulization Q6H Lowne Chase, Evangelynn Lochridge R, DO        No Known Allergies  Review of Systems  Constitutional: Negative for fever and malaise/fatigue.  HENT: Positive for ear pain (left). Negative for congestion.   Eyes: Negative for blurred vision.  Respiratory: Negative for cough and shortness of breath.   Cardiovascular: Negative for chest pain, palpitations and leg swelling.  Gastrointestinal: Negative for vomiting.  Musculoskeletal: Negative for back pain.  Skin: Negative for rash.  Neurological: Negative for loss of consciousness and headaches.       Objective:    Physical Exam  Constitutional: She is oriented to person, place, and time. She appears well-developed and well-nourished.  HENT:  Head: Normocephalic and atraumatic.  Right Ear: Tympanic membrane, external ear and ear canal normal.  Left Ear: There is tenderness.  Ears:  Eyes: Conjunctivae and EOM are normal.  Neck: Normal range of motion. Neck supple. No JVD present. Carotid bruit is not present. No thyromegaly present.  Cardiovascular: Normal rate, regular rhythm and normal heart sounds.  No murmur heard. Pulmonary/Chest: Effort normal and breath sounds normal. No respiratory distress. She has no wheezes. She has no rales. She exhibits no  tenderness.  Musculoskeletal: She exhibits no edema.  Neurological: She is alert and oriented to person, place, and time.  Psychiatric: She has a normal mood and affect.    BP 116/80 (BP Location: Right Arm, Cuff Size: Normal)   Pulse 95   Temp 97.8 F (36.6 C) (Oral)   Resp  16   Ht '4\' 11"'$  (1.499 m)   Wt 128 lb 12.8 oz (58.4 kg)   SpO2 96%   BMI 26.01 kg/m  Wt Readings from Last 3 Encounters:  09/30/17 128 lb 12.8 oz (58.4 kg)  07/30/17 124 lb (56.2 kg)  05/25/17 126 lb 6.4 oz (57.3 kg)   BP Readings from Last 3 Encounters:  09/30/17 116/80  07/30/17 104/70  05/25/17 110/72     Immunization History  Administered Date(s) Administered  . DTaP 01/24/1979  . Influenza Whole 01/22/2010, 02/05/2012  . Influenza, High Dose Seasonal PF 01/29/2017  . Influenza,inj,Quad PF,6+ Mos 02/19/2014  . Influenza-Unspecified 01/17/2015, 01/28/2016  . Pneumococcal Conjugate-13 07/08/2015  . Pneumococcal Polysaccharide-23 01/05/2009, 02/19/2014  . Td 01/24/2007  . Zoster 02/16/2012    Health Maintenance  Topic Date Due  . COLONOSCOPY  07/04/2000  . MAMMOGRAM  02/20/2016  . TETANUS/TDAP  01/23/2017  . OPHTHALMOLOGY EXAM  01/28/2017  . FOOT EXAM  02/06/2017  . HEMOGLOBIN A1C  08/08/2017  . INFLUENZA VACCINE  11/04/2017  . URINE MICROALBUMIN  02/08/2018  . PNA vac Low Risk Adult (2 of 2 - PPSV23) 02/20/2019  . DEXA SCAN  Completed  . Hepatitis C Screening  Completed    Lab Results  Component Value Date   WBC 7.5 05/25/2017   HGB 11.8 (L) 05/25/2017   HCT 36.0 05/25/2017   PLT 298.0 05/25/2017   GLUCOSE 340 (H) 05/25/2017   CHOL 151 02/08/2017   TRIG 144.0 02/08/2017   HDL 42.50 02/08/2017   LDLDIRECT 162.0 03/28/2015   LDLCALC 80 02/08/2017   ALT 15 05/25/2017   AST 19 05/25/2017   NA 135 05/25/2017   K 4.2 05/25/2017   CL 98 05/25/2017   CREATININE 0.55 05/25/2017   BUN 13 05/25/2017   CO2 30 05/25/2017   TSH 0.49 05/25/2017   HGBA1C 8.8 (H) 02/08/2017   MICROALBUR  0.8 02/08/2017    Lab Results  Component Value Date   TSH 0.49 05/25/2017   Lab Results  Component Value Date   WBC 7.5 05/25/2017   HGB 11.8 (L) 05/25/2017   HCT 36.0 05/25/2017   MCV 92.3 05/25/2017   PLT 298.0 05/25/2017   Lab Results  Component Value Date   NA 135 05/25/2017   K 4.2 05/25/2017   CO2 30 05/25/2017   GLUCOSE 340 (H) 05/25/2017   BUN 13 05/25/2017   CREATININE 0.55 05/25/2017   BILITOT 0.3 05/25/2017   ALKPHOS 71 05/25/2017   AST 19 05/25/2017   ALT 15 05/25/2017   PROT 8.0 05/25/2017   ALBUMIN 3.4 (L) 05/25/2017   CALCIUM 9.7 05/25/2017   GFR 117.22 05/25/2017   Lab Results  Component Value Date   CHOL 151 02/08/2017   Lab Results  Component Value Date   HDL 42.50 02/08/2017   Lab Results  Component Value Date   LDLCALC 80 02/08/2017   Lab Results  Component Value Date   TRIG 144.0 02/08/2017   Lab Results  Component Value Date   CHOLHDL 4 02/08/2017   Lab Results  Component Value Date   HGBA1C 8.8 (H) 02/08/2017         Assessment & Plan:   Problem List Items Addressed This Visit    None    Visit Diagnoses    Left ear pain    -  Primary   Relevant Medications   ofloxacin (FLOXIN OTIC) 0.3 % OTIC solution   Other Relevant Orders   Ambulatory referral to ENT  Tinnitus of right ear       Relevant Orders   Ambulatory referral to ENT      I am having Tyara A. Popwell start on ofloxacin. I am also having her maintain her Insulin Pen Needle, traMADol, pantoprazole, levocetirizine, ONE TOUCH ULTRA 2, LANTUS SOLOSTAR, glucose blood, Dulaglutide, ALPRAZolam, levothyroxine, hydrochlorothiazide, valACYclovir, FLUoxetine, and glimepiride. We will continue to administer ipratropium-albuterol.  Meds ordered this encounter  Medications  . ofloxacin (FLOXIN OTIC) 0.3 % OTIC solution    Sig: Place 10 drops into the left ear daily.    Dispense:  5 mL    Refill:  0    CMA served as scribe during this visit. History, Physical and  Plan performed by medical provider. Documentation and orders reviewed and attested to.  [name], DO

## 2017-10-04 DIAGNOSIS — S0340XA Sprain of jaw, unspecified side, initial encounter: Secondary | ICD-10-CM | POA: Diagnosis not present

## 2017-10-15 ENCOUNTER — Other Ambulatory Visit: Payer: Self-pay | Admitting: Family Medicine

## 2017-10-17 DIAGNOSIS — G4733 Obstructive sleep apnea (adult) (pediatric): Secondary | ICD-10-CM | POA: Diagnosis not present

## 2017-10-21 DIAGNOSIS — Z7984 Long term (current) use of oral hypoglycemic drugs: Secondary | ICD-10-CM | POA: Diagnosis not present

## 2017-10-21 DIAGNOSIS — E1165 Type 2 diabetes mellitus with hyperglycemia: Secondary | ICD-10-CM | POA: Diagnosis not present

## 2017-10-21 DIAGNOSIS — Z794 Long term (current) use of insulin: Secondary | ICD-10-CM | POA: Diagnosis not present

## 2017-10-25 ENCOUNTER — Telehealth: Payer: Self-pay | Admitting: Family Medicine

## 2017-10-25 NOTE — Telephone Encounter (Signed)
Spoke w/ Pt- we do have a sample of Trulicity we can give her- informed that she can come by office at her convenience. Pt verbalized understanding.

## 2017-10-25 NOTE — Telephone Encounter (Signed)
Copied from [place] [id number]. Topic: Quick Communication - See Telephone Encounter >> Oct 25, 2017 10:54 AM Percell Belt A wrote: CRM for notification. See Telephone encounter for: 10/25/17.  Pt called and would like to know if Dr Etter Sjogren could possible provide her a sample of the Dulaglutide (TRULICITY) 8.01 KP/5.3ZS SOPN [[id number].  She is in the [place] and it is going to cost her $750.00 for the script    Best number  [phone]

## 2017-11-03 DIAGNOSIS — N61 Mastitis without abscess: Secondary | ICD-10-CM | POA: Diagnosis not present

## 2017-11-15 ENCOUNTER — Encounter: Payer: Self-pay | Admitting: Internal Medicine

## 2017-11-17 DIAGNOSIS — G4733 Obstructive sleep apnea (adult) (pediatric): Secondary | ICD-10-CM | POA: Diagnosis not present

## 2017-11-18 DIAGNOSIS — M4326 Fusion of spine, lumbar region: Secondary | ICD-10-CM | POA: Diagnosis not present

## 2017-11-18 DIAGNOSIS — M7552 Bursitis of left shoulder: Secondary | ICD-10-CM | POA: Diagnosis not present

## 2017-11-18 DIAGNOSIS — M545 Low back pain: Secondary | ICD-10-CM | POA: Diagnosis not present

## 2017-11-29 ENCOUNTER — Other Ambulatory Visit: Payer: Self-pay | Admitting: Family Medicine

## 2017-11-29 DIAGNOSIS — F419 Anxiety disorder, unspecified: Secondary | ICD-10-CM

## 2017-11-29 DIAGNOSIS — Z79899 Other long term (current) drug therapy: Secondary | ICD-10-CM

## 2017-11-30 ENCOUNTER — Emergency Department (HOSPITAL_BASED_OUTPATIENT_CLINIC_OR_DEPARTMENT_OTHER): Payer: PPO

## 2017-11-30 ENCOUNTER — Emergency Department (HOSPITAL_BASED_OUTPATIENT_CLINIC_OR_DEPARTMENT_OTHER)
Admission: EM | Admit: 2017-11-30 | Discharge: 2017-12-01 | Disposition: A | Discharge status: Home / Self Care | Payer: PPO | Attending: Emergency Medicine | Admitting: Emergency Medicine

## 2017-11-30 ENCOUNTER — Other Ambulatory Visit: Payer: Self-pay

## 2017-11-30 ENCOUNTER — Encounter (HOSPITAL_BASED_OUTPATIENT_CLINIC_OR_DEPARTMENT_OTHER): Payer: Self-pay | Admitting: *Deleted

## 2017-11-30 DIAGNOSIS — Y929 Unspecified place or not applicable: Secondary | ICD-10-CM | POA: Diagnosis not present

## 2017-11-30 DIAGNOSIS — S0181XA Laceration without foreign body of other part of head, initial encounter: Secondary | ICD-10-CM | POA: Diagnosis not present

## 2017-11-30 DIAGNOSIS — S80211A Abrasion, right knee, initial encounter: Secondary | ICD-10-CM | POA: Diagnosis not present

## 2017-11-30 DIAGNOSIS — Z794 Long term (current) use of insulin: Secondary | ICD-10-CM | POA: Diagnosis not present

## 2017-11-30 DIAGNOSIS — E119 Type 2 diabetes mellitus without complications: Secondary | ICD-10-CM | POA: Diagnosis not present

## 2017-11-30 DIAGNOSIS — Z87891 Personal history of nicotine dependence: Secondary | ICD-10-CM | POA: Diagnosis not present

## 2017-11-30 DIAGNOSIS — J449 Chronic obstructive pulmonary disease, unspecified: Secondary | ICD-10-CM | POA: Diagnosis not present

## 2017-11-30 DIAGNOSIS — R51 Headache: Secondary | ICD-10-CM | POA: Diagnosis not present

## 2017-11-30 DIAGNOSIS — Y9301 Activity, walking, marching and hiking: Secondary | ICD-10-CM | POA: Insufficient documentation

## 2017-11-30 DIAGNOSIS — M25532 Pain in left wrist: Secondary | ICD-10-CM | POA: Diagnosis not present

## 2017-11-30 DIAGNOSIS — Y999 Unspecified external cause status: Secondary | ICD-10-CM | POA: Diagnosis not present

## 2017-11-30 DIAGNOSIS — Y92009 Unspecified place in unspecified non-institutional (private) residence as the place of occurrence of the external cause: Secondary | ICD-10-CM

## 2017-11-30 DIAGNOSIS — W19XXXA Unspecified fall, initial encounter: Secondary | ICD-10-CM

## 2017-11-30 DIAGNOSIS — S6992XA Unspecified injury of left wrist, hand and finger(s), initial encounter: Secondary | ICD-10-CM | POA: Diagnosis not present

## 2017-11-30 DIAGNOSIS — S0121XA Laceration without foreign body of nose, initial encounter: Secondary | ICD-10-CM | POA: Insufficient documentation

## 2017-11-30 DIAGNOSIS — W010XXA Fall on same level from slipping, tripping and stumbling without subsequent striking against object, initial encounter: Secondary | ICD-10-CM | POA: Insufficient documentation

## 2017-11-30 DIAGNOSIS — Z79899 Other long term (current) drug therapy: Secondary | ICD-10-CM | POA: Diagnosis not present

## 2017-11-30 NOTE — ED Triage Notes (Signed)
Her shoe got caught and she fell in the road. Laceration to the bridge of her nose. Abrasion above her lip and right knee. Teeth intact. No LOC.

## 2017-11-30 NOTE — ED Provider Notes (Signed)
[facility] EMERGENCY DEPARTMENT Provider Note   CSN: [id number] Arrival date & time: 11/30/17  2039     History   Chief Complaint Chief Complaint  Patient presents with  . Fall    HPI [name] is a 67 y.o. female.  Patient stumbled while walking, falling forward, landing on knees and outstretched arms. She has abrasions to her right knee, on the face above the upper lip below the nose, and a laceration to the bridge of the nose.  She also has pain in the left wrist. There was no loss of consciousness. She denies neck/back pain. Tetanus is up to date.  The history is provided by the patient. No language interpreter was used.  Fall  This is a new problem. The current episode started 1 to 2 hours ago. Associated symptoms include headaches. Pertinent negatives include no chest pain and no shortness of breath.    Past Medical History:  Diagnosis Date  . Arthritis   . Diabetes mellitus   . OSA on CPAP   . Thyroid disease     Patient Active Problem List   Diagnosis Date Noted  . Sinus headache 05/25/2017  . Headache above the eye region 05/25/2017  . Pre-op evaluation 02/08/2017  . Obstructive sleep apnea treated with continuous positive airway pressure (CPAP) 01/28/2017  . Urinary frequency 11/23/2016  . DM (diabetes mellitus) type II uncontrolled, periph vascular disorder ([place]) 11/23/2016  . Left lower quadrant pain 11/23/2016  . Chronic pansinusitis 10/12/2016  . Preventative health care 02/07/2016  . Cigarette nicotine dependence with nicotine-induced disorder 07/22/2015  . Hypersomnia with sleep apnea 07/22/2015  . Snoring 07/22/2015  . S/P UPPP (uvulopalatopharyngoplasty) 07/22/2015  . Severe frontal headaches 07/22/2015  . Chronic ethmoidal sinusitis 07/22/2015  . OSA (obstructive sleep apnea) 07/08/2015  . Acute upper respiratory infection 02/05/2015  . Cervical osteoarthritis 11/23/2014  . Degeneration of intervertebral disc of cervical region  11/23/2014  . Cervical pain 11/23/2014  . Abdominal pain 08/01/2014  . COPD exacerbation ([place]) 02/07/2013  . OE (otitis externa) 12/09/2012  . Hypothyroidism 06/04/2010  . Diabetes mellitus type II, uncontrolled ([place]) 06/04/2010  . Hyperlipidemia LDL goal <70 06/04/2010  . Acute pansinusitis 06/04/2010  . OSTEOARTHRITIS 06/04/2010    Past Surgical History:  Procedure Laterality Date  . ABDOMINAL HYSTERECTOMY    . BACK SURGERY     x3  . BACK SURGERY     x 5 , 2 neck surgeries, 2 lower back surgeries  . CHOLECYSTECTOMY    . THYROID SURGERY    . TONSILECTOMY, ADENOIDECTOMY, BILATERAL MYRINGOTOMY AND TUBES       OB History   None      Home Medications    Prior to Admission medications   Medication Sig Start Date End Date Taking? Authorizing Provider  ALPRAZolam Duanne Moron) 0.5 MG tablet TAKE ONE TABLET BY MOUTH THREE TIMES A DAY AS NEEDED FOR ANXIETY OR SLEEP 05/25/17   Carollee Herter, Alferd Apa, DO  Blood Glucose Monitoring Suppl (ONE TOUCH ULTRA 2) w/Device KIT Check blood sugar as directed. Dx:E11.65 07/11/15   Carollee Herter, Kendrick Fries R, DO  Dulaglutide (TRULICITY) 5.63 JS/9.7WY SOPN Inject weekly 05/21/17   Carollee Herter, Kendrick Fries R, DO  FLUoxetine (PROZAC) 20 MG capsule TAKE ONE CAPSULE BY MOUTH DAILY 09/15/17   Carollee Herter, Alferd Apa, DO  glimepiride (AMARYL) 4 MG tablet TAKE ONE TABLET BY MOUTH DAILY WITH BREAKFAST 09/28/17   Carollee Herter, Yvonne R, DO  glucose blood (ONE TOUCH ULTRA TEST)  test strip Check blood sugar 4 times per day.E11.65 03/25/17   [name], DO  hydrochlorothiazide (HYDRODIURIL) 25 MG tablet Take 1 tablet (25 mg total) by mouth daily. 07/15/17   Roma Schanz R, DO  hydrochlorothiazide (HYDRODIURIL) 25 MG tablet TAKE ONE TABLET BY MOUTH DAILY 10/18/17   Carollee Herter, Alferd Apa, DO  Insulin Pen Needle (NOVOTWIST) 32G X 5 MM MISC Use daily at Bedtime with Lantus 02/17/12   Carollee Herter, Alferd Apa, DO  LANTUS SOLOSTAR 100 UNIT/ML Solostar Pen INJECT 25 UNITS INTO  THE SKIN AT BEDTIME 01/25/17   Carollee Herter, Alferd Apa, DO  levocetirizine (XYZAL) 5 MG tablet Take 1 tablet (5 mg total) by mouth every evening. 03/28/15   [name], DO  levothyroxine (SYNTHROID, LEVOTHROID) 137 MCG tablet TAKE ONE TABLET BY MOUTH EVERY MORNING BEFORE BREAKFAST 06/28/17   Carollee Herter, Alferd Apa, DO  ofloxacin (FLOXIN OTIC) 0.3 % OTIC solution Place 10 drops into the left ear daily. 09/30/17   [name], DO  pantoprazole (PROTONIX) 40 MG tablet Take 1 tablet (40 mg total) by mouth daily. 08/06/14   [name], DO  traMADol (ULTRAM) 50 MG tablet Take 1 tablet (50 mg total) by mouth every 8 (eight) hours as needed for pain. 12/09/12   [name], DO  valACYclovir (VALTREX) 1000 MG tablet Take 1 tablet (1,000 mg total) by mouth daily as needed. 08/03/17   Mosie Lukes, MD    Family History Family History  Problem Relation Age of Onset  . Stroke Mother   . Alcohol abuse Mother   . Stroke Maternal Grandmother   . Heart disease Maternal Grandfather   . Breast cancer Cousin   . Breast cancer Daughter     Social History Social History   Tobacco Use  . Smoking status: Former Smoker    Years: 48.00    Last attempt to quit: 02/01/2017    Years since quitting: 0.8  . Smokeless tobacco: Never Used  Substance Use Topics  . Alcohol use: No    Alcohol/week: 0.0 standard drinks  . Drug use: No     Allergies   Patient has no known allergies.   Review of Systems Review of Systems  Respiratory: Negative for shortness of breath.   Cardiovascular: Negative for chest pain.  Musculoskeletal: Positive for arthralgias.  Skin: Positive for wound.  Neurological: Positive for headaches.  All other systems reviewed and are negative.    Physical Exam Updated Vital Signs BP 139/72   Pulse (!) 101   Temp 98.1 F (36.7 C) (Oral)   Resp 20   Ht 4' 11.5" (1.511 m)   Wt 59 kg   SpO2 98%   BMI 25.82 kg/m   Physical Exam    Constitutional: She is oriented to person, place, and time. She appears well-developed and well-nourished.  HENT:  Head: Normocephalic. Head is with abrasion and with laceration.    Eyes: Conjunctivae and EOM are normal.  Neck: Normal range of motion. Neck supple.  Cardiovascular: Normal rate and regular rhythm.  Pulmonary/Chest: Effort normal and breath sounds normal.  Abdominal: Soft.  Musculoskeletal: She exhibits tenderness. She exhibits no deformity.       Legs: Neurological: She is alert and oriented to person, place, and time.  Skin: Skin is warm and dry.  Psychiatric: She has a normal mood and affect.  Nursing note and vitals reviewed.    ED Treatments / Results  Labs (all labs ordered are listed, but only abnormal results are displayed) Labs Reviewed - No data to display  EKG None  Radiology No results found.  Procedures .Marland KitchenLaceration Repair Date/Time: 12/01/2017 12:02 AM Performed by: Etta Quill, NP Authorized by: Etta Quill, NP   Consent:    Consent obtained:  Verbal   Consent given by:  Patient   Risks discussed:  Poor cosmetic result   Alternatives discussed:  No treatment Anesthesia (see MAR for exact dosages):    Anesthesia method:  None Laceration details:    Location:  Face   Face location:  Nose   Length (cm):  1.5 Exploration:    Hemostasis achieved with:  Direct pressure   Contaminated: no   Treatment:    Area cleansed with:  Saline   Amount of cleaning:  Standard Skin repair:    Repair method:  Tissue adhesive Approximation:    Approximation:  Loose Post-procedure details:    Dressing:  Open (no dressing)   Patient tolerance of procedure:  Tolerated well, no immediate complications   (including critical care time)  Medications Ordered in ED Medications - No data to display   Initial Impression / Assessment and Plan / ED Course  I have reviewed the triage vital signs and the nursing notes.  Pertinent labs & imaging results  that were available during my care of the patient were reviewed by me and considered in my medical decision making (see chart for details).     Fall at home.  Patient X-Ray negative for obvious fracture or dislocation.  Pt advised to follow up with orthopedics/pcp. Patient given wrist splint while in ED, conservative therapy recommended and discussed. Patient will be discharged home & is agreeable with above plan. Returns precautions discussed. Pt appears safe for discharge.  Tetanus UTD. Laceration occurred < 12 hours prior to repair. Discussed laceration care with pt and answered questions. Wound closure with dermabond. Recheck  should there be signs of dehiscence or infection. Pt is hemodynamically stable with no complaints prior to dc.    Final Clinical Impressions(s) / ED Diagnoses   Final diagnoses:  Fall in home, initial encounter  Left wrist pain  Facial laceration, initial encounter    ED Discharge Orders    None       Etta Quill, NP 12/01/17 0006    Quintella Reichert, MD 12/01/17 [phone]

## 2017-12-03 NOTE — Telephone Encounter (Signed)
Refill request for ALPRAZolam (XANAX) 0.5 MG tablet.   Last OV: 09/30/17 Last RF: 05/25/17  CSC:  UDS:   Controlled substance report printed.

## 2017-12-03 NOTE — Telephone Encounter (Signed)
Are contract and uds due?

## 2017-12-03 NOTE — Telephone Encounter (Signed)
Left message on machine that she will need to come in to sign a contract and do UDS before next refill can be done.  Since we did not speak with her we will fax in her prescription.  Will print contract when patient comes in and uds done as a future order.

## 2017-12-03 NOTE — Addendum Note (Signed)
Addended by: Kem Boroughs D on: 12/03/2017 02:18 PM   Modules accepted: Orders

## 2017-12-07 DIAGNOSIS — H10521 Angular blepharoconjunctivitis, right eye: Secondary | ICD-10-CM | POA: Diagnosis not present

## 2017-12-18 DIAGNOSIS — G4733 Obstructive sleep apnea (adult) (pediatric): Secondary | ICD-10-CM | POA: Diagnosis not present

## 2017-12-24 ENCOUNTER — Other Ambulatory Visit: Payer: Self-pay | Admitting: Family Medicine

## 2017-12-27 ENCOUNTER — Ambulatory Visit: Payer: PPO | Admitting: Family Medicine

## 2017-12-27 ENCOUNTER — Ambulatory Visit (INDEPENDENT_AMBULATORY_CARE_PROVIDER_SITE_OTHER): Payer: PPO | Admitting: Family Medicine

## 2017-12-27 ENCOUNTER — Encounter: Payer: Self-pay | Admitting: Family Medicine

## 2017-12-27 VITALS — BP 110/78 | HR 105 | Temp 98.9°F | Resp 16 | Ht 59.0 in | Wt 132.6 lb

## 2017-12-27 DIAGNOSIS — F419 Anxiety disorder, unspecified: Secondary | ICD-10-CM

## 2017-12-27 DIAGNOSIS — Z79899 Other long term (current) drug therapy: Secondary | ICD-10-CM | POA: Diagnosis not present

## 2017-12-27 DIAGNOSIS — H10022 Other mucopurulent conjunctivitis, left eye: Secondary | ICD-10-CM | POA: Diagnosis not present

## 2017-12-27 MED ORDER — ALPRAZOLAM 0.5 MG PO TABS: ORAL_TABLET | ORAL | 1 refills | Status: DC

## 2017-12-27 MED ORDER — NEOMYCIN-POLYMYXIN-DEXAMETH 3.5-10000-0.1 OP SUSP: 1.0000 [drp] | Freq: Four times a day (QID) | OPHTHALMIC | 0 refills | Status: DC

## 2017-12-27 NOTE — Assessment & Plan Note (Signed)
abx gtts per orders Wash all linens, washcloths , towels daily Cool compresses F/u opth prn

## 2017-12-27 NOTE — Patient Instructions (Signed)

## 2017-12-27 NOTE — Progress Notes (Signed)
Patient ID: [name], female    DOB: 10/29/1950  Age: 67 y.o. MRN: [id number]    Subjective:  Subjective  HPI [name] presents for pink eye L eye.  She had it in the R eye and used gtts and It resolved but after about a week it started to come back.  No congestion or sinus pressure.    Review of Systems  Constitutional: Negative for activity change, appetite change, fatigue and unexpected weight change.  Eyes: Positive for discharge, redness and itching. Negative for photophobia, pain and visual disturbance.  Respiratory: Negative for cough and shortness of breath.   Cardiovascular: Negative for chest pain and palpitations.  Psychiatric/Behavioral: Negative for behavioral problems and dysphoric mood. The patient is not nervous/anxious.     History Past Medical History:  Diagnosis Date  . Arthritis   . Diabetes mellitus   . OSA on CPAP   . Thyroid disease     She has a past surgical history that includes Cholecystectomy; Abdominal hysterectomy; Back surgery; Tonsilectomy, adenoidectomy, bilateral myringotomy and tubes; Thyroid surgery; and Back surgery.   Her family history includes Alcohol abuse in her mother; Breast cancer in her cousin and daughter; Heart disease in her maternal grandfather; Stroke in her maternal grandmother and mother.She reports that she quit smoking about 10 months ago. She quit after 48.00 years of use. She has never used smokeless tobacco. She reports that she does not drink alcohol or use drugs.  Current Outpatient Medications on File Prior to Visit  Medication Sig Dispense Refill  . Blood Glucose Monitoring Suppl (ONE TOUCH ULTRA 2) w/Device KIT Check blood sugar as directed. Dx:E11.65 1 each 0  . Dulaglutide (TRULICITY) 0.75 MG/0.5ML SOPN Inject weekly 4 pen 3  . FLUoxetine (PROZAC) 20 MG capsule TAKE ONE CAPSULE BY MOUTH DAILY 30 capsule 4  . glimepiride (AMARYL) 4 MG tablet TAKE ONE TABLET BY MOUTH DAILY WITH BREAKFAST 90 tablet 0  . glucose  blood (ONE TOUCH ULTRA TEST) test strip Check blood sugar 4 times per day.E11.65 400 each 2  . hydrochlorothiazide (HYDRODIURIL) 25 MG tablet Take 1 tablet (25 mg total) by mouth daily. 90 tablet 0  . hydrochlorothiazide (HYDRODIURIL) 25 MG tablet TAKE ONE TABLET BY MOUTH DAILY 70 tablet 0  . Insulin Pen Needle (NOVOTWIST) 32G X 5 MM MISC Use daily at Bedtime with Lantus 100 each 0  . LANTUS SOLOSTAR 100 UNIT/ML Solostar Pen INJECT 25 UNITS INTO THE SKIN AT BEDTIME 15 pen 10  . levocetirizine (XYZAL) 5 MG tablet Take 1 tablet (5 mg total) by mouth every evening. 30 tablet 5  . levothyroxine (SYNTHROID, LEVOTHROID) 137 MCG tablet TAKE ONE TABLET BY MOUTH EVERY MORNING BEFORE BREAKFAST 90 tablet 0  . ofloxacin (FLOXIN OTIC) 0.3 % OTIC solution Place 10 drops into the left ear daily. 5 mL 0  . pantoprazole (PROTONIX) 40 MG tablet Take 1 tablet (40 mg total) by mouth daily. 30 tablet 11  . traMADol (ULTRAM) 50 MG tablet Take 1 tablet (50 mg total) by mouth every 8 (eight) hours as needed for pain. 30 tablet 0  . valACYclovir (VALTREX) 1000 MG tablet Take 1 tablet (1,000 mg total) by mouth daily as needed. 30 tablet 0   Current Facility-Administered Medications on File Prior to Visit  Medication Dose Route Frequency Provider Last Rate Last Dose  . ipratropium-albuterol (DUONEB) 0.5-2.5 (3) MG/3ML nebulizer solution 3 mL  3 mL Nebulization Q6H Lowne Chase, Yvonne R, DO           Objective:  Objective  Physical Exam  Constitutional: She is oriented to person, place, and time. She appears well-developed and well-nourished.  HENT:  Head: Normocephalic and atraumatic.  Eyes: Conjunctivae and EOM are normal. Left eye exhibits discharge. No scleral icterus.  Neck: Normal range of motion. Neck supple. No JVD present. Carotid bruit is not present. No thyromegaly present.  Cardiovascular: Normal rate, regular rhythm and normal heart sounds.  No murmur heard. Pulmonary/Chest: Effort normal and breath sounds  normal. No respiratory distress. She has no wheezes. She has no rales. She exhibits no tenderness.  Musculoskeletal: She exhibits no edema.  Neurological: She is alert and oriented to person, place, and time.  Psychiatric: She has a normal mood and affect.  Nursing note and vitals reviewed.  BP 110/78 (BP Location: Right Arm, Cuff Size: Normal)   Pulse (!) 105   Temp 98.9 F (37.2 C) (Oral)   Resp 16   Ht 4' 11" (1.499 m)   Wt 132 lb 9.6 oz (60.1 kg)   SpO2 93%   BMI 26.78 kg/m  Wt Readings from Last 3 Encounters:  12/27/17 132 lb 9.6 oz (60.1 kg)  11/30/17 130 lb (59 kg)  09/30/17 128 lb 12.8 oz (58.4 kg)     Lab Results  Component Value Date   WBC 7.5 05/25/2017   HGB 11.8 (L) 05/25/2017   HCT 36.0 05/25/2017   PLT 298.0 05/25/2017   GLUCOSE 340 (H) 05/25/2017   CHOL 151 02/08/2017   TRIG 144.0 02/08/2017   HDL 42.50 02/08/2017   LDLDIRECT 162.0 03/28/2015   LDLCALC 80 02/08/2017   ALT 15 05/25/2017   AST 19 05/25/2017   NA 135 05/25/2017   K 4.2 05/25/2017   CL 98 05/25/2017   CREATININE 0.55 05/25/2017   BUN 13 05/25/2017   CO2 30 05/25/2017   TSH 0.49 05/25/2017   HGBA1C 8.8 (H) 02/08/2017   MICROALBUR 0.8 02/08/2017    Dg Wrist Complete Left  Result Date: 11/30/2017 CLINICAL DATA:  [address], LEFT wrist pain and limited range of motion. EXAM: LEFT WRIST - COMPLETE 3+ VIEW COMPARISON:  None. FINDINGS: No acute fracture deformity or dislocation. Osteopenia. Mild degenerative change of the wrist. No destructive bony lesions. Soft tissue planes are not suspicious. IMPRESSION: 1. No acute fracture deformity or dislocation. 2. Osteopenia which decreases sensitivity for acute nondisplaced fractures. Electronically Signed   By: Elon Alas M.D.   On: 11/30/2017 22:25   Ct Head Wo Contrast  Result Date: 11/30/2017 CLINICAL DATA:  67 y/o F; fall with nasal abrasion and frontal headache. EXAM: CT HEAD WITHOUT CONTRAST TECHNIQUE: Contiguous axial images were  obtained from the base of the skull through the vertex without intravenous contrast. COMPARISON:  05/10/2017 CT head. FINDINGS: Brain: No evidence of acute infarction, hemorrhage, hydrocephalus, extra-axial collection or mass lesion/mass effect. Vascular: Mild calcific atherosclerosis of the carotid siphons. No hyperdense vessel identified. Skull: Normal. Negative for fracture or focal lesion. Sinuses/Orbits: No acute finding. Other: None. IMPRESSION: Stable negative CT of the head. Electronically Signed   By: Kristine Garbe M.D.   On: 11/30/2017 22:23     Assessment & Plan:  Plan  I am having Sarahi A. Kithcart start on neomycin-polymyxin b-dexamethasone. I am also having her maintain her Insulin Pen Needle, traMADol, pantoprazole, levocetirizine, ONE TOUCH ULTRA 2, LANTUS SOLOSTAR, glucose blood, Dulaglutide, hydrochlorothiazide, valACYclovir, FLUoxetine, glimepiride, ofloxacin, hydrochlorothiazide, levothyroxine, and ALPRAZolam. We will continue to administer ipratropium-albuterol.  Meds ordered this encounter  Medications  .  neomycin-polymyxin b-dexamethasone (MAXITROL) 3.5-10000-0.1 SUSP    Sig: Place 1 drop into the left eye every 6 (six) hours.    Dispense:  5 mL    Refill:  0  . DISCONTD: ALPRAZolam (XANAX) 0.5 MG tablet    Sig: 1 po tid prn    Dispense:  30 tablet    Refill:  1  . ALPRAZolam (XANAX) 0.5 MG tablet    Sig: 1 po tid prn    Dispense:  30 tablet    Refill:  1    Problem List Items Addressed This Visit      Unprioritized   Pink eye disease of left eye - Primary    abx gtts per orders Wash all linens, washcloths , towels daily Cool compresses F/u opth prn      Relevant Medications   neomycin-polymyxin b-dexamethasone (MAXITROL) 3.5-10000-0.1 SUSP    Other Visit Diagnoses    Anxiety       Relevant Medications   ALPRAZolam (XANAX) 0.5 MG tablet   Other Relevant Orders   Pain Mgmt, Profile 8 w/Conf, U   High risk medication use       Relevant Orders    Pain Mgmt, Profile 8 w/Conf, U      Follow-up: Return if symptoms worsen or fail to improve.  Yvonne R Lowne Chase, DO     

## 2017-12-29 LAB — PAIN MGMT, PROFILE 8 W/CONF, U
6 ACETYLMORPHINE: NEGATIVE ng/mL (ref <10)
ALCOHOL METABOLITES: NEGATIVE ng/mL (ref <500)
ALPHAHYDROXYTRIAZOLAM: NEGATIVE ng/mL (ref <50)
AMINOCLONAZEPAM: NEGATIVE ng/mL (ref <25)
Alphahydroxyalprazolam: 42 ng/mL — ABNORMAL HIGH (ref <25)
Alphahydroxymidazolam: NEGATIVE ng/mL (ref <50)
Amphetamines: NEGATIVE ng/mL (ref <500)
Benzodiazepines: POSITIVE ng/mL — AB (ref <100)
Buprenorphine, Urine: NEGATIVE ng/mL (ref <5)
Cocaine Metabolite: NEGATIVE ng/mL (ref <150)
Creatinine: 58.6 mg/dL
Hydroxyethylflurazepam: NEGATIVE ng/mL (ref <50)
Lorazepam: NEGATIVE ng/mL (ref <50)
MDMA: NEGATIVE ng/mL (ref <500)
Marijuana Metabolite: NEGATIVE ng/mL (ref <20)
NORDIAZEPAM: NEGATIVE ng/mL (ref <50)
OPIATES: NEGATIVE ng/mL (ref <100)
OXIDANT: NEGATIVE ug/mL (ref <200)
Oxazepam: NEGATIVE ng/mL (ref <50)
Oxycodone: NEGATIVE ng/mL (ref <100)
Temazepam: NEGATIVE ng/mL (ref <50)
pH: 6.38 (ref 4.5–9.0)

## 2018-01-03 DIAGNOSIS — G4733 Obstructive sleep apnea (adult) (pediatric): Secondary | ICD-10-CM | POA: Diagnosis not present

## 2018-01-11 ENCOUNTER — Encounter: Payer: Self-pay | Admitting: Internal Medicine

## 2018-01-11 ENCOUNTER — Ambulatory Visit (INDEPENDENT_AMBULATORY_CARE_PROVIDER_SITE_OTHER): Payer: PPO | Admitting: Internal Medicine

## 2018-01-11 VITALS — BP 104/60 | HR 65 | Ht 59.0 in | Wt 133.0 lb

## 2018-01-11 DIAGNOSIS — Z1211 Encounter for screening for malignant neoplasm of colon: Secondary | ICD-10-CM

## 2018-01-11 NOTE — Progress Notes (Signed)
[name] 67 y.o. 1950/05/07 [id number]  Assessment & Plan:   Encounter Diagnosis  Name Primary?  . Colon cancer screening Yes   Schedule screning colonoscopy  The risks and benefits as well as alternatives of endoscopic procedure(s) have been discussed and reviewed. All questions answered. The patient agrees to proceed.  I appreciate the opportunity to care for her. No charge for this visit - would typically see RN - triage mistake by [place].   Subjective:   Chief Complaint: colon cancer screening  HPI No GI Sxs Last colonoscopy 10+ years ago and no polyps No Known Allergies Current Meds  Medication Sig  . ALPRAZolam (XANAX) 0.5 MG tablet 1 po tid prn  . Blood Glucose Monitoring Suppl (ONE TOUCH ULTRA 2) w/Device KIT Check blood sugar as directed. Dx:E11.65  . Dulaglutide (TRULICITY) 9.67 EL/3.8BO SOPN Inject weekly  . FLUoxetine (PROZAC) 20 MG capsule TAKE ONE CAPSULE BY MOUTH DAILY  . glimepiride (AMARYL) 4 MG tablet TAKE ONE TABLET BY MOUTH DAILY WITH BREAKFAST  . glucose blood (ONE TOUCH ULTRA TEST) test strip Check blood sugar 4 times per day.E11.65  . hydrochlorothiazide (HYDRODIURIL) 25 MG tablet TAKE ONE TABLET BY MOUTH DAILY  . Insulin Pen Needle (NOVOTWIST) 32G X 5 MM MISC Use daily at Bedtime with Lantus  . LANTUS SOLOSTAR 100 UNIT/ML Solostar Pen INJECT 25 UNITS INTO THE SKIN AT BEDTIME  . levocetirizine (XYZAL) 5 MG tablet Take 1 tablet (5 mg total) by mouth every evening.  Marland Kitchen levothyroxine (SYNTHROID, LEVOTHROID) 137 MCG tablet TAKE ONE TABLET BY MOUTH EVERY MORNING BEFORE BREAKFAST  . neomycin-polymyxin b-dexamethasone (MAXITROL) 3.5-10000-0.1 SUSP Place 1 drop into the left eye every 6 (six) hours.  Marland Kitchen ofloxacin (FLOXIN OTIC) 0.3 % OTIC solution Place 10 drops into the left ear daily.  . pantoprazole (PROTONIX) 40 MG tablet Take 1 tablet (40 mg total) by mouth daily.  . traMADol (ULTRAM) 50 MG tablet Take 1 tablet (50 mg total) by mouth every 8 (eight)  hours as needed for pain.  . valACYclovir (VALTREX) 1000 MG tablet Take 1 tablet (1,000 mg total) by mouth daily as needed.  . [DISCONTINUED] hydrochlorothiazide (HYDRODIURIL) 25 MG tablet Take 1 tablet (25 mg total) by mouth daily.   Current Facility-Administered Medications for the 01/11/18 encounter (Office Visit) with Gatha Mayer, MD  Medication  . ipratropium-albuterol (DUONEB) 0.5-2.5 (3) MG/3ML nebulizer solution 3 mL   Past Medical History:  Diagnosis Date  . Arthritis   . Diabetes mellitus   . Esophagitis   . Hiatal hernia   . OSA on CPAP   . Thyroid disease   . TMJ (sprain of temporomandibular joint)    Past Surgical History:  Procedure Laterality Date  . ABDOMINAL HYSTERECTOMY    . BACK SURGERY     x3  . BACK SURGERY     x 5 , 2 neck surgeries, 2 lower back surgeries  . CHOLECYSTECTOMY    . COLONOSCOPY    . ERCP    . ESOPHAGOGASTRODUODENOSCOPY    . THYROID SURGERY    . TONSILECTOMY, ADENOIDECTOMY, BILATERAL MYRINGOTOMY AND TUBES     Social History   Social History Narrative   Exercise--  3 flts steps a day for 8 hours   family history includes Alcohol abuse in her mother; Breast cancer in her cousin and daughter; Heart disease in her maternal grandfather; Stroke in her maternal grandmother and mother.   Review of Systems Pink eye late Sept  Objective:   Physical  Exam BP 104/60   Pulse 65   Ht '4\' 11"'$  (1.499 m)   Wt 133 lb (60.3 kg)   BMI 26.86 kg/m  NAD

## 2018-01-11 NOTE — Patient Instructions (Signed)

## 2018-01-12 DIAGNOSIS — M1712 Unilateral primary osteoarthritis, left knee: Secondary | ICD-10-CM | POA: Diagnosis not present

## 2018-01-12 DIAGNOSIS — M25512 Pain in left shoulder: Secondary | ICD-10-CM | POA: Diagnosis not present

## 2018-01-12 DIAGNOSIS — M25562 Pain in left knee: Secondary | ICD-10-CM | POA: Diagnosis not present

## 2018-01-14 ENCOUNTER — Encounter: Payer: Self-pay | Admitting: Internal Medicine

## 2018-01-15 ENCOUNTER — Other Ambulatory Visit: Payer: Self-pay | Admitting: Family Medicine

## 2018-01-26 DIAGNOSIS — E1165 Type 2 diabetes mellitus with hyperglycemia: Secondary | ICD-10-CM | POA: Diagnosis not present

## 2018-01-26 DIAGNOSIS — Z794 Long term (current) use of insulin: Secondary | ICD-10-CM | POA: Diagnosis not present

## 2018-01-31 ENCOUNTER — Ambulatory Visit: Payer: PPO | Admitting: Nurse Practitioner

## 2018-02-11 ENCOUNTER — Encounter: Payer: Self-pay | Admitting: Internal Medicine

## 2018-02-14 ENCOUNTER — Ambulatory Visit (INDEPENDENT_AMBULATORY_CARE_PROVIDER_SITE_OTHER): Payer: PPO

## 2018-02-14 DIAGNOSIS — Z23 Encounter for immunization: Secondary | ICD-10-CM

## 2018-02-15 ENCOUNTER — Telehealth: Payer: Self-pay | Admitting: Internal Medicine

## 2018-02-15 NOTE — Telephone Encounter (Signed)
Patient states she lost her coupon for her prep and would like a new one mailed or sent to her if possible. Patient had ov on 10.8.19 and procedure is scheduled for 11.21.19.

## 2018-02-15 NOTE — Telephone Encounter (Signed)
I called her and she just needs a miralax coupon. I confirmed her address and it will go out to her tomorrow.

## 2018-02-19 ENCOUNTER — Other Ambulatory Visit: Payer: Self-pay | Admitting: Family Medicine

## 2018-02-19 DIAGNOSIS — E119 Type 2 diabetes mellitus without complications: Secondary | ICD-10-CM

## 2018-02-19 DIAGNOSIS — Z794 Long term (current) use of insulin: Principal | ICD-10-CM

## 2018-02-21 DIAGNOSIS — N390 Urinary tract infection, site not specified: Secondary | ICD-10-CM | POA: Diagnosis not present

## 2018-02-24 ENCOUNTER — Ambulatory Visit (AMBULATORY_SURGERY_CENTER): Payer: PPO | Admitting: Internal Medicine

## 2018-02-24 ENCOUNTER — Encounter: Payer: Self-pay | Admitting: Internal Medicine

## 2018-02-24 VITALS — BP 102/67 | HR 88 | Temp 98.2°F | Resp 14 | Ht 59.0 in | Wt 133.0 lb

## 2018-02-24 DIAGNOSIS — D124 Benign neoplasm of descending colon: Secondary | ICD-10-CM

## 2018-02-24 DIAGNOSIS — D123 Benign neoplasm of transverse colon: Secondary | ICD-10-CM | POA: Diagnosis not present

## 2018-02-24 DIAGNOSIS — Z1211 Encounter for screening for malignant neoplasm of colon: Secondary | ICD-10-CM

## 2018-02-24 DIAGNOSIS — K635 Polyp of colon: Secondary | ICD-10-CM | POA: Diagnosis not present

## 2018-02-24 MED ORDER — SODIUM CHLORIDE 0.9 % IV SOLN: 500.0000 mL | Freq: Once | INTRAVENOUS | Status: DC

## 2018-02-24 NOTE — Op Note (Addendum)
[facility] Patient Name: Andreika Vandagriff Procedure Date: 02/24/2018 1:33 PM MRN: [id number] Endoscopist: Gatha Mayer , MD Age: 67 Referring MD:  Date of Birth: Sep 14, 1950 Gender: Female Account #: [id number] Procedure:                Colonoscopy Indications:              Screening for colorectal malignant neoplasm Medicines:                Propofol per Anesthesia, Monitored Anesthesia Care Procedure:                Pre-Anesthesia Assessment:                           - Prior to the procedure, a History and Physical                            was performed, and patient medications and                            allergies were reviewed. The patient's tolerance of                            previous anesthesia was also reviewed. The risks                            and benefits of the procedure and the sedation                            options and risks were discussed with the patient.                            All questions were answered, and informed consent                            was obtained. Prior Anticoagulants: The patient has                            taken no previous anticoagulant or antiplatelet                            agents. ASA Grade Assessment: III - A patient with                            severe systemic disease. After reviewing the risks                            and benefits, the patient was deemed in                            satisfactory condition to undergo the procedure.                           After obtaining informed consent, the colonoscope  was passed under direct vision. Throughout the                            procedure, the patient's blood pressure, pulse, and                            oxygen saturations were monitored continuously. The                            Model CF-HQ190L [id number]) scope was introduced                            through the anus and advanced to the the cecum,           identified by appendiceal orifice and ileocecal                            valve. The colonoscopy was performed without                            difficulty. The patient tolerated the procedure                            well. The quality of the bowel preparation was                            good. The ileocecal valve, appendiceal orifice, and                            rectum were photographed. The bowel preparation                            used was Miralax. Scope In: 1:45:41 PM Scope Out: 2:07:40 PM Scope Withdrawal Time: 0 hours 17 minutes 58 seconds  Total Procedure Duration: 0 hours 21 minutes 59 seconds  Findings:                 Two sessile polyps were found in the descending                            colon and transverse colon. The polyps were                            diminutive in size. These polyps were removed with                            a cold snare. Resection and retrieval were                            complete. Verification of patient identification                            for the specimen was done. Estimated blood loss was  minimal.                           Diverticula were found in the sigmoid colon.                           The exam was otherwise without abnormality on                            direct and retroflexion views. Complications:            No immediate complications. Estimated Blood Loss:     Estimated blood loss was minimal. Impression:               - Two diminutive polyps in the descending colon and                            in the transverse colon, removed with a cold snare.                            Resected and retrieved.                           - Diverticulosis in the sigmoid colon.                           - The examination was otherwise normal on direct                            and retroflexion views. Recommendation:           - Patient has a contact number available for                             emergencies. The signs and symptoms of potential                            delayed complications were discussed with the                            patient. Return to normal activities tomorrow.                            Written discharge instructions were provided to the                            patient.                           - Resume previous diet.                           - Continue present medications.                           - Repeat colonoscopy is recommended. The  colonoscopy date will be determined after pathology                            results from today's exam become available for                            review. Gatha Mayer, MD 02/24/2018 2:18:46 PM This report has been signed electronically.

## 2018-02-24 NOTE — Progress Notes (Signed)
Called to room to assist during endoscopic procedure.  Patient ID and intended procedure confirmed with present staff. Received instructions for my participation in the procedure from the performing physician.  

## 2018-02-24 NOTE — Progress Notes (Signed)
PT taken to PACU. Monitors in place. VSS. Report given to RN. 

## 2018-02-24 NOTE — Patient Instructions (Addendum)
I found and removed 2 small colon polyps.  You also have a condition called diverticulosis - common and not usually a problem. Please read the handout provided.  I will let you know pathology results and when to have another routine colonoscopy by mail and/or My Chart.  I appreciate the opportunity to care for you. Gatha Mayer, MD, FACG YOU HAD AN ENDOSCOPIC PROCEDURE TODAY AT [facility]:   Refer to the procedure report that was given to you for any specific questions about what was found during the examination.  If the procedure report does not answer your questions, please call your gastroenterologist to clarify.  If you requested that your care partner not be given the details of your procedure findings, then the procedure report has been included in a sealed envelope for you to review at your convenience later.  YOU SHOULD EXPECT: Some feelings of bloating in the abdomen. Passage of more gas than usual.  Walking can help get rid of the air that was put into your GI tract during the procedure and reduce the bloating. If you had a lower endoscopy (such as a colonoscopy or flexible sigmoidoscopy) you may notice spotting of blood in your stool or on the toilet paper. If you underwent a bowel prep for your procedure, you may not have a normal bowel movement for a few days.  Please Note:  You might notice some irritation and congestion in your nose or some drainage.  This is from the oxygen used during your procedure.  There is no need for concern and it should clear up in a day or so.  SYMPTOMS TO REPORT IMMEDIATELY:   Following lower endoscopy (colonoscopy or flexible sigmoidoscopy):  Excessive amounts of blood in the stool  Significant tenderness or worsening of abdominal pains  Swelling of the abdomen that is new, acute  Fever of 100F or higher  For urgent or emergent issues, a gastroenterologist can be reached at any hour by calling [phone].   DIET:   We do recommend a small meal at first, but then you may proceed to your regular diet.  Drink plenty of fluids but you should avoid alcoholic beverages for 24 hours.  ACTIVITY:  You should plan to take it easy for the rest of today and you should NOT DRIVE or use heavy machinery until tomorrow (because of the sedation medicines used during the test).    FOLLOW UP: Our staff will call the number listed on your records the next business day following your procedure to check on you and address any questions or concerns that you may have regarding the information given to you following your procedure. If we do not reach you, we will leave a message.  However, if you are feeling well and you are not experiencing any problems, there is no need to return our call.  We will assume that you have returned to your regular daily activities without incident.  If any biopsies were taken you will be contacted by phone or by letter within the next 1-3 weeks.  Please call us at [phone] if you have not heard about the biopsies in 3 weeks.   Await for biopsy results Polyps (handout given) Diverticulosis (handout given)  SIGNATURES/CONFIDENTIALITY: You and/or your care partner have signed paperwork which will be entered into your electronic medical record.  These signatures attest to the fact that that the information above on your After Visit Summary has been reviewed and is understood.  Full responsibility of the confidentiality of this discharge information lies with you and/or your care-partner.

## 2018-02-25 ENCOUNTER — Telehealth: Payer: Self-pay

## 2018-02-25 NOTE — Telephone Encounter (Signed)
  Follow up Call-  Call back number 02/24/2018  Post procedure Call Back phone  # [phone]  Permission to leave phone message Yes  Some recent data might be hidden     Patient questions:  Do you have a fever, pain , or abdominal swelling? No. Pain Score  0 *  Have you tolerated food without any problems? Yes.    Have you been able to return to your normal activities? Yes.    Do you have any questions about your discharge instructions: Diet   No. Medications  No. Follow up visit  No.  Do you have questions or concerns about your Care? No.  Actions: * If pain score is 4 or above: No action needed, pain <4.

## 2018-02-28 ENCOUNTER — Telehealth: Payer: Self-pay | Admitting: *Deleted

## 2018-02-28 ENCOUNTER — Encounter: Payer: Self-pay | Admitting: Internal Medicine

## 2018-02-28 NOTE — Telephone Encounter (Signed)
Received call from Evelena Peat at [facility] regarding clarification on the directions for a Alprazolam refill for the patient.   Directions given.//AB/CMA

## 2018-03-06 ENCOUNTER — Encounter: Payer: Self-pay | Admitting: Internal Medicine

## 2018-03-06 DIAGNOSIS — Z860100 Personal history of colon polyps, unspecified: Secondary | ICD-10-CM

## 2018-03-06 HISTORY — DX: Personal history of colonic polyps: Z86.010

## 2018-03-06 HISTORY — DX: Personal history of colon polyps, unspecified: Z86.0100

## 2018-03-06 NOTE — Progress Notes (Signed)
1 adenoma and 1 ssp Recall 2024 My Chart letter

## 2018-03-14 ENCOUNTER — Telehealth: Payer: Self-pay | Admitting: Internal Medicine

## 2018-03-14 NOTE — Telephone Encounter (Signed)
Patient notified of the results and recommendations See my chart message for details.

## 2018-03-14 NOTE — Telephone Encounter (Signed)
Pt called inquiring path results. Pls call her.

## 2018-03-16 DIAGNOSIS — M7582 Other shoulder lesions, left shoulder: Secondary | ICD-10-CM | POA: Diagnosis not present

## 2018-03-18 ENCOUNTER — Ambulatory Visit: Payer: PPO | Admitting: Family

## 2018-03-20 ENCOUNTER — Other Ambulatory Visit: Payer: Self-pay | Admitting: Family Medicine

## 2018-03-22 DIAGNOSIS — G4733 Obstructive sleep apnea (adult) (pediatric): Secondary | ICD-10-CM | POA: Diagnosis not present

## 2018-03-24 DIAGNOSIS — M25512 Pain in left shoulder: Secondary | ICD-10-CM | POA: Diagnosis not present

## 2018-03-24 DIAGNOSIS — M25562 Pain in left knee: Secondary | ICD-10-CM | POA: Diagnosis not present

## 2018-04-04 DIAGNOSIS — M25562 Pain in left knee: Secondary | ICD-10-CM | POA: Diagnosis not present

## 2018-04-11 DIAGNOSIS — M25562 Pain in left knee: Secondary | ICD-10-CM | POA: Diagnosis not present

## 2018-04-11 DIAGNOSIS — M25362 Other instability, left knee: Secondary | ICD-10-CM | POA: Diagnosis not present

## 2018-04-13 DIAGNOSIS — M545 Low back pain: Secondary | ICD-10-CM | POA: Diagnosis not present

## 2018-04-13 DIAGNOSIS — M4326 Fusion of spine, lumbar region: Secondary | ICD-10-CM | POA: Diagnosis not present

## 2018-04-13 DIAGNOSIS — M549 Dorsalgia, unspecified: Secondary | ICD-10-CM | POA: Diagnosis not present

## 2018-04-27 DIAGNOSIS — S83242A Other tear of medial meniscus, current injury, left knee, initial encounter: Secondary | ICD-10-CM | POA: Diagnosis not present

## 2018-04-27 DIAGNOSIS — M94262 Chondromalacia, left knee: Secondary | ICD-10-CM | POA: Diagnosis not present

## 2018-04-27 DIAGNOSIS — S83282A Other tear of lateral meniscus, current injury, left knee, initial encounter: Secondary | ICD-10-CM | POA: Diagnosis not present

## 2018-04-27 DIAGNOSIS — M25562 Pain in left knee: Secondary | ICD-10-CM | POA: Diagnosis not present

## 2018-05-02 ENCOUNTER — Other Ambulatory Visit: Payer: Self-pay | Admitting: Family Medicine

## 2018-05-02 DIAGNOSIS — F419 Anxiety disorder, unspecified: Secondary | ICD-10-CM

## 2018-05-04 NOTE — Telephone Encounter (Signed)
Database ran and is on your desk for review.  Last filled per database: 04/09/18 Last written: 12/27/17 Last ov: 12/27/17 Next ov: none Contract:12/28/18 UDS: 12/28/18

## 2018-05-09 DIAGNOSIS — M94262 Chondromalacia, left knee: Secondary | ICD-10-CM | POA: Diagnosis not present

## 2018-05-09 DIAGNOSIS — G8918 Other acute postprocedural pain: Secondary | ICD-10-CM | POA: Diagnosis not present

## 2018-05-09 DIAGNOSIS — M23222 Derangement of posterior horn of medial meniscus due to old tear or injury, left knee: Secondary | ICD-10-CM | POA: Diagnosis not present

## 2018-06-03 ENCOUNTER — Other Ambulatory Visit: Payer: Self-pay | Admitting: Family Medicine

## 2018-06-03 DIAGNOSIS — F419 Anxiety disorder, unspecified: Secondary | ICD-10-CM

## 2018-06-03 NOTE — Telephone Encounter (Signed)
Requesting:xanax Contract:yes UDS:low risk next screen  Last OV:12/27/17 Next OV:n/a Last Refill:04/14/18  #[id number] Database:   Please advise

## 2018-06-30 ENCOUNTER — Other Ambulatory Visit: Payer: Self-pay | Admitting: Family Medicine

## 2018-06-30 DIAGNOSIS — F419 Anxiety disorder, unspecified: Secondary | ICD-10-CM

## 2018-06-30 NOTE — Telephone Encounter (Signed)
Last written:  06/03/18 Last ov: 12/27/17 Next ov: none Contract: 12/28/18 UDS: 12/28/18

## 2018-08-01 ENCOUNTER — Telehealth: Payer: Self-pay | Admitting: *Deleted

## 2018-08-01 NOTE — Telephone Encounter (Signed)
Pt last seen 12/2017 and needs follow up on: diabetes, hypothyroid and hyperlipidemia. Attempted to contact pt to explain and schedule Virtual visit if she has smartphone, laptop or tablet and left message to return my call. Needs to schedule virtual visit soon if capable.

## 2018-08-02 ENCOUNTER — Ambulatory Visit (INDEPENDENT_AMBULATORY_CARE_PROVIDER_SITE_OTHER): Payer: PPO | Admitting: Family Medicine

## 2018-08-02 ENCOUNTER — Encounter: Payer: Self-pay | Admitting: Family Medicine

## 2018-08-02 ENCOUNTER — Other Ambulatory Visit: Payer: Self-pay

## 2018-08-02 DIAGNOSIS — F419 Anxiety disorder, unspecified: Secondary | ICD-10-CM

## 2018-08-02 DIAGNOSIS — E039 Hypothyroidism, unspecified: Secondary | ICD-10-CM

## 2018-08-02 DIAGNOSIS — E1169 Type 2 diabetes mellitus with other specified complication: Secondary | ICD-10-CM

## 2018-08-02 DIAGNOSIS — E1165 Type 2 diabetes mellitus with hyperglycemia: Secondary | ICD-10-CM

## 2018-08-02 DIAGNOSIS — E785 Hyperlipidemia, unspecified: Secondary | ICD-10-CM

## 2018-08-02 MED ORDER — ALPRAZOLAM 0.5 MG PO TABS: ORAL_TABLET | ORAL | 0 refills | Status: DC

## 2018-08-02 NOTE — Progress Notes (Signed)
Virtual Visit via Video Note  I connected with [name] on 08/02/18 at  9:15 AM EDT by a video enabled telemedicine application and verified that I am speaking with the correct person using two identifiers.   I discussed the limitations of evaluation and management by telemedicine and the availability of in person appointments. The patient expressed understanding and agreed to proceed. We were unable to connect via video--- had to finish via televisit  History of Present Illness:  HPI Pt is home needing refill for xanax and labs Pt with no complaints.   HYPERTENSION   Blood pressure range-not checking   Chest pain- no      Dyspnea- no Lightheadedness- no   Edema- no Other side effects - no   Medication compliance: no Low salt diet- yes    DIABETES    Blood Sugar ranges-91-130   Polyuria- no New Visual problems- no  Hypoglycemic symptoms- no  Other side effects-no Medication compliance - good Last eye exam- 11/2017    HYPERLIPIDEMIA  Medication compliance- good  RUQ pain- no  Muscle aches- no Other side effects-no   ROS Review of Systems  Constitutional: Negative for activity change, appetite change and fatigue.  HENT: Negative for hearing loss, congestion, tinnitus and ear discharge.  dentist q13mEyes: Negative for visual disturbance (see optho q1y -- vision corrected to 20/20 with glasses).  Respiratory: Negative for cough, chest tightness and shortness of breath.   Cardiovascular: Negative for chest pain, palpitations and leg swelling.  Gastrointestinal: Negative for abdominal pain, diarrhea, constipation and abdominal distention.  Genitourinary: Negative for urgency, frequency, decreased urine volume and difficulty urinating.  Musculoskeletal: Negative for back pain, arthralgias and gait problem.  Skin: Negative for color change, pallor and rash.  Neurological: Negative for dizziness, light-headedness, numbness and headaches.  Hematological: Negative for  adenopathy. Does not bruise/bleed easily.  Psychiatric/Behavioral: Negative for suicidal ideas, confusion, sleep disturbance, self-injury, dysphoric mood, decreased concentration and agitation.       PMH Past Medical History:  Diagnosis Date  . Arthritis   . Diabetes mellitus   . Esophagitis   . Hiatal hernia   . Hx of colonic polyps ssp and adenoma 03/06/2018  . OSA on CPAP   . Sleep apnea   . Thyroid disease   . TMJ (sprain of temporomandibular joint)     Outpatient Encounter Medications as of 08/02/2018  Medication Sig  . ALPRAZolam (XANAX) 0.5 MG tablet TAKE 1 TABLET BY MOUTH THREE TIMES A DAY AS NEEDED FOR ANXIETY OR SLEEP  . baclofen (LIORESAL) 10 MG tablet Take 10 mg by mouth 1 day or 1 dose.  . Blood Glucose Monitoring Suppl (ONE TOUCH ULTRA 2) w/Device KIT Check blood sugar as directed. Dx:E11.65  . FLUoxetine (PROZAC) 20 MG capsule TAKE ONE CAPSULE BY MOUTH DAILY  . glimepiride (AMARYL) 4 MG tablet TAKE ONE TABLET BY MOUTH DAILY WITH BREAKFAST  . glucose blood (ONE TOUCH ULTRA TEST) test strip Check blood sugar 4 times per day.E11.65  . hydrochlorothiazide (HYDRODIURIL) 25 MG tablet TAKE ONE TABLET BY MOUTH DAILY  . Insulin Glargine (LANTUS SOLOSTAR) 100 UNIT/ML Solostar Pen Inject 25 Units into the skin at bedtime. Needs ov/labs  . Insulin Pen Needle (NOVOTWIST) 32G X 5 MM MISC Use daily at Bedtime with Lantus  . levothyroxine (SYNTHROID, LEVOTHROID) 137 MCG tablet TAKE ONE TABLET BY MOUTH EVERY MORNING BEFORE BREAKFAST  . meloxicam (MOBIC) 15 MG tablet Take 15 mg by mouth 1 day or 1 dose.  .Marland Kitchen  pantoprazole (PROTONIX) 40 MG tablet Take 1 tablet (40 mg total) by mouth daily.  . traMADol (ULTRAM) 50 MG tablet Take 1 tablet (50 mg total) by mouth every 8 (eight) hours as needed for pain.  . valACYclovir (VALTREX) 1000 MG tablet Take 1 tablet (1,000 mg total) by mouth daily as needed.  . [DISCONTINUED] ALPRAZolam (XANAX) 0.5 MG tablet TAKE 1 TABLET BY MOUTH THREE TIMES A DAY  AS NEEDED FOR ANXIETY OR SLEEP  . [DISCONTINUED] ALPRAZolam (XANAX) 0.5 MG tablet TAKE 1 TABLET BY MOUTH THREE TIMES A DAY AS NEEDED FOR ANXIETY OR SLEEP  . [DISCONTINUED] Dulaglutide (TRULICITY) 4.92 EF/0.0FH SOPN Inject weekly (Patient not taking: Reported on 02/24/2018)  . [DISCONTINUED] levocetirizine (XYZAL) 5 MG tablet Take 1 tablet (5 mg total) by mouth every evening. (Patient not taking: Reported on 02/24/2018)  . [DISCONTINUED] neomycin-polymyxin b-dexamethasone (MAXITROL) 3.5-10000-0.1 SUSP Place 1 drop into the left eye every 6 (six) hours. (Patient not taking: Reported on 02/24/2018)  . [DISCONTINUED] ofloxacin (FLOXIN OTIC) 0.3 % OTIC solution Place 10 drops into the left ear daily. (Patient not taking: Reported on 02/24/2018)  . [DISCONTINUED] rosuvastatin (CRESTOR) 5 MG tablet Take 5 mg by mouth 1 day or 1 dose.   Facility-Administered Encounter Medications as of 08/02/2018  Medication  . 0.9 %  sodium chloride infusion  . ipratropium-albuterol (DUONEB) 0.5-2.5 (3) MG/3ML nebulizer solution 3 mL     provider -- in office Observations/Objective: 138 wt  Ht 5 ft    Pt in nad  Pt with no complaints   Assessment and Plan: 1. Anxiety Stable Refill meds  - ALPRAZolam (XANAX) 0.5 MG tablet; TAKE 1 TABLET BY MOUTH THREE TIMES A DAY AS NEEDED FOR ANXIETY OR SLEEP  Dispense: 90 tablet; Refill: 0  2. Hypothyroidism, unspecified type Stable Check labs  - TSH; Future  3. Hyperlipidemia associated with type 2 diabetes mellitus ([place]) Tolerating statin, encouraged heart healthy diet, avoid trans fats, minimize simple carbs and saturated fats. Increase exercise as tolerated - Lipid panel; Future - Comprehensive metabolic panel; Future  4. Uncontrolled type 2 diabetes mellitus with hyperglycemia ([place]) hgba1c to be checked , minimize simple carbs. Increase exercise as tolerated. Continue current meds - Hemoglobin A1c; Future - Comprehensive metabolic panel; Future   Follow Up  Instructions:    I discussed the assessment and treatment plan with the patient. The patient was provided an opportunity to ask questions and all were answered. The patient agreed with the plan and demonstrated an understanding of the instructions.   The patient was advised to call back or seek an in-person evaluation if the symptoms worsen or if the condition fails to improve as anticipated.  I provided 25 minutes of non-face-to-face time during this encounter.   [name], DO

## 2018-08-03 DIAGNOSIS — M47896 Other spondylosis, lumbar region: Secondary | ICD-10-CM | POA: Diagnosis not present

## 2018-08-03 DIAGNOSIS — M5416 Radiculopathy, lumbar region: Secondary | ICD-10-CM | POA: Diagnosis not present

## 2018-08-03 DIAGNOSIS — M4326 Fusion of spine, lumbar region: Secondary | ICD-10-CM | POA: Diagnosis not present

## 2018-08-03 DIAGNOSIS — M545 Low back pain: Secondary | ICD-10-CM | POA: Diagnosis not present

## 2018-08-08 ENCOUNTER — Other Ambulatory Visit: Payer: PPO

## 2018-08-08 ENCOUNTER — Other Ambulatory Visit: Payer: Self-pay | Admitting: Orthopaedic Surgery

## 2018-08-08 DIAGNOSIS — M4326 Fusion of spine, lumbar region: Secondary | ICD-10-CM

## 2018-08-10 NOTE — Telephone Encounter (Signed)
Pt had virtual visit on 08/02/18.

## 2018-08-15 ENCOUNTER — Ambulatory Visit
Admission: RE | Admit: 2018-08-15 | Discharge: 2018-08-15 | Disposition: A | Discharge status: Home / Self Care | Payer: PPO | Source: Ambulatory Visit | Attending: Orthopaedic Surgery | Admitting: Orthopaedic Surgery

## 2018-08-15 DIAGNOSIS — M4326 Fusion of spine, lumbar region: Secondary | ICD-10-CM | POA: Diagnosis not present

## 2018-08-28 ENCOUNTER — Other Ambulatory Visit: Payer: Self-pay | Admitting: Family Medicine

## 2018-09-06 DIAGNOSIS — G4733 Obstructive sleep apnea (adult) (pediatric): Secondary | ICD-10-CM | POA: Diagnosis not present

## 2018-09-12 ENCOUNTER — Other Ambulatory Visit: Payer: PPO

## 2018-09-15 ENCOUNTER — Other Ambulatory Visit: Payer: Self-pay | Admitting: Family Medicine

## 2018-09-15 DIAGNOSIS — T84216A Breakdown (mechanical) of internal fixation device of vertebrae, initial encounter: Secondary | ICD-10-CM | POA: Diagnosis not present

## 2018-09-15 DIAGNOSIS — M5106 Intervertebral disc disorders with myelopathy, lumbar region: Secondary | ICD-10-CM | POA: Diagnosis not present

## 2018-09-15 DIAGNOSIS — M4326 Fusion of spine, lumbar region: Secondary | ICD-10-CM | POA: Diagnosis not present

## 2018-09-15 DIAGNOSIS — Z4689 Encounter for fitting and adjustment of other specified devices: Secondary | ICD-10-CM | POA: Diagnosis not present

## 2018-09-15 DIAGNOSIS — M545 Low back pain: Secondary | ICD-10-CM | POA: Diagnosis not present

## 2018-09-15 DIAGNOSIS — M5416 Radiculopathy, lumbar region: Secondary | ICD-10-CM | POA: Diagnosis not present

## 2018-09-15 DIAGNOSIS — M960 Pseudarthrosis after fusion or arthrodesis: Secondary | ICD-10-CM | POA: Diagnosis not present

## 2018-09-16 ENCOUNTER — Telehealth: Payer: Self-pay | Admitting: Family Medicine

## 2018-09-16 NOTE — Telephone Encounter (Signed)
Pt dropped off document to be filled out by provider ( 1 page from [facility]) Pt would like document to be faxed to [phone] when ready. Document put at front office tray under providers name.

## 2018-09-21 NOTE — Telephone Encounter (Signed)
Paperwork receive. Appointment will be set up for Friday.

## 2018-09-23 ENCOUNTER — Other Ambulatory Visit: Payer: Self-pay | Admitting: Family Medicine

## 2018-09-23 ENCOUNTER — Encounter: Payer: Self-pay | Admitting: Family Medicine

## 2018-09-23 ENCOUNTER — Ambulatory Visit (INDEPENDENT_AMBULATORY_CARE_PROVIDER_SITE_OTHER): Payer: PPO | Admitting: Family Medicine

## 2018-09-23 ENCOUNTER — Other Ambulatory Visit (INDEPENDENT_AMBULATORY_CARE_PROVIDER_SITE_OTHER): Payer: PPO

## 2018-09-23 ENCOUNTER — Other Ambulatory Visit: Payer: Self-pay

## 2018-09-23 VITALS — BP 115/71 | HR 95 | Temp 98.3°F | Resp 18 | Ht 59.0 in | Wt 140.6 lb

## 2018-09-23 DIAGNOSIS — E1165 Type 2 diabetes mellitus with hyperglycemia: Secondary | ICD-10-CM | POA: Diagnosis not present

## 2018-09-23 DIAGNOSIS — E1169 Type 2 diabetes mellitus with other specified complication: Secondary | ICD-10-CM | POA: Diagnosis not present

## 2018-09-23 DIAGNOSIS — E785 Hyperlipidemia, unspecified: Secondary | ICD-10-CM

## 2018-09-23 DIAGNOSIS — E039 Hypothyroidism, unspecified: Secondary | ICD-10-CM | POA: Diagnosis not present

## 2018-09-23 DIAGNOSIS — Z01818 Encounter for other preprocedural examination: Secondary | ICD-10-CM

## 2018-09-23 LAB — COMPREHENSIVE METABOLIC PANEL
ALT: 27 U/L (ref 0–35)
AST: 24 U/L (ref 0–37)
Albumin: 3.8 g/dL (ref 3.5–5.2)
Alkaline Phosphatase: 86 U/L (ref 39–117)
BUN: 21 mg/dL (ref 6–23)
CO2: 26 mEq/L (ref 19–32)
Calcium: 9.4 mg/dL (ref 8.4–10.5)
Chloride: 102 mEq/L (ref 96–112)
Creatinine, Ser: 0.68 mg/dL (ref 0.40–1.20)
GFR: 85.99 mL/min (ref >60.00)
Glucose, Bld: 107 mg/dL — ABNORMAL HIGH (ref 70–99)
Potassium: 3.8 mEq/L (ref 3.5–5.1)
Sodium: 139 mEq/L (ref 135–145)
Total Bilirubin: 0.4 mg/dL (ref 0.2–1.2)
Total Protein: 7.2 g/dL (ref 6.0–8.3)

## 2018-09-23 LAB — LIPID PANEL
Cholesterol: 156 mg/dL (ref 0–200)
HDL: 36.1 mg/dL — ABNORMAL LOW (ref >39.00)
LDL Cholesterol: 93 mg/dL (ref 0–99)
NonHDL: 120.11
Total CHOL/HDL Ratio: 4
Triglycerides: 135 mg/dL (ref 0.0–149.0)
VLDL: 27 mg/dL (ref 0.0–40.0)

## 2018-09-23 LAB — TSH: TSH: 0.01 u[IU]/mL — ABNORMAL LOW (ref 0.35–4.50)

## 2018-09-23 LAB — HEMOGLOBIN A1C: Hgb A1c MFr Bld: 10.5 % — ABNORMAL HIGH (ref 4.6–6.5)

## 2018-09-23 NOTE — Patient Instructions (Signed)
Carbohydrate Counting for Diabetes Mellitus, Adult  Carbohydrate counting is a method of keeping track of how many carbohydrates you eat. Eating carbohydrates naturally increases the amount of sugar (glucose) in the blood. Counting how many carbohydrates you eat helps keep your blood glucose within normal limits, which helps you manage your diabetes (diabetes mellitus). It is important to know how many carbohydrates you can safely have in each meal. This is different for every person. A diet and nutrition specialist (registered dietitian) can help you make a meal plan and calculate how many carbohydrates you should have at each meal and snack. Carbohydrates are found in the following foods:  Grains, such as breads and cereals.  Dried beans and soy products.  Starchy vegetables, such as potatoes, peas, and corn.  Fruit and fruit juices.  Milk and yogurt.  Sweets and snack foods, such as cake, cookies, candy, chips, and soft drinks. How do I count carbohydrates? There are two ways to count carbohydrates in food. You can use either of the methods or a combination of both. Reading "Nutrition Facts" on packaged food The "Nutrition Facts" list is included on the labels of almost all packaged foods and beverages in the [place] It includes:  The serving size.  Information about nutrients in each serving, including the grams (g) of carbohydrate per serving. To use the "Nutrition Facts":  Decide how many servings you will have.  Multiply the number of servings by the number of carbohydrates per serving.  The resulting number is the total amount of carbohydrates that you will be having. Learning standard serving sizes of other foods When you eat carbohydrate foods that are not packaged or do not include "Nutrition Facts" on the label, you need to measure the servings in order to count the amount of carbohydrates:  Measure the foods that you will eat with a food scale or measuring cup, if needed.   Decide how many standard-size servings you will eat.  Multiply the number of servings by 15. Most carbohydrate-rich foods have about 15 g of carbohydrates per serving. ? For example, if you eat 8 oz (170 g) of strawberries, you will have eaten 2 servings and 30 g of carbohydrates (2 servings x 15 g = 30 g).  For foods that have more than one food mixed, such as soups and casseroles, you must count the carbohydrates in each food that is included. The following list contains standard serving sizes of common carbohydrate-rich foods. Each of these servings has about 15 g of carbohydrates:   hamburger bun or  English muffin.   oz (15 mL) syrup.   oz (14 g) jelly.  1 slice of bread.  1 six-inch tortilla.  3 oz (85 g) cooked rice or pasta.  4 oz (113 g) cooked dried beans.  4 oz (113 g) starchy vegetable, such as peas, corn, or potatoes.  4 oz (113 g) hot cereal.  4 oz (113 g) mashed potatoes or  of a large baked potato.  4 oz (113 g) canned or frozen fruit.  4 oz (120 mL) fruit juice.  4-6 crackers.  6 chicken nuggets.  6 oz (170 g) unsweetened dry cereal.  6 oz (170 g) plain fat-free yogurt or yogurt sweetened with artificial sweeteners.  8 oz (240 mL) milk.  8 oz (170 g) fresh fruit or one small piece of fruit.  24 oz (680 g) popped popcorn. Example of carbohydrate counting Sample meal  3 oz (85 g) chicken breast.  6 oz (170 g)   brown rice.  4 oz (113 g) corn.  8 oz (240 mL) milk.  8 oz (170 g) strawberries with sugar-free whipped topping. Carbohydrate calculation 1. Identify the foods that contain carbohydrates: ? Rice. ? Corn. ? Milk. ? Strawberries. 2. Calculate how many servings you have of each food: ? 2 servings rice. ? 1 serving corn. ? 1 serving milk. ? 1 serving strawberries. 3. Multiply each number of servings by 15 g: ? 2 servings rice x 15 g = 30 g. ? 1 serving corn x 15 g = 15 g. ? 1 serving milk x 15 g = 15 g. ? 1 serving  strawberries x 15 g = 15 g. 4. Add together all of the amounts to find the total grams of carbohydrates eaten: ? 30 g + 15 g + 15 g + 15 g = 75 g of carbohydrates total. Summary  Carbohydrate counting is a method of keeping track of how many carbohydrates you eat.  Eating carbohydrates naturally increases the amount of sugar (glucose) in the blood.  Counting how many carbohydrates you eat helps keep your blood glucose within normal limits, which helps you manage your diabetes.  A diet and nutrition specialist (registered dietitian) can help you make a meal plan and calculate how many carbohydrates you should have at each meal and snack. This information is not intended to replace advice given to you by your health care provider. Make sure you discuss any questions you have with your health care provider. Document Released: 03/23/2005 Document Revised: 09/30/2016 Document Reviewed: 09/04/2015 Elsevier Interactive Patient Education  2019 [employer]  

## 2018-09-23 NOTE — Telephone Encounter (Signed)
Medication Refill - Medication: One Touch test meter (Pharmacy stated that they need the prescription sent for the meter.)  Has the patient contacted their pharmacy? Yes (Agent: If no, request that the patient contact the pharmacy for the refill.) (Agent: If yes, when and what did the pharmacy advise?)Contact Pcp  Preferred Pharmacy (with phone number or street name):  [facility] [place], [place] [phone] (Phone) [phone] (Fax)     Agent: Please be advised that RX refills may take up to 3 business days. We ask that you follow-up with your pharmacy.

## 2018-09-25 NOTE — Progress Notes (Signed)
Subjective:    [name] is a 68 y.o. female who presents to the office today for a preoperative consultation at the request of surgeon Dr Patrice Paradise who plans on performing surgery on Low back  on July 7. This consultation is requested for the specific conditions prompting preoperative evaluation (i.e. because of potential affect on operative risk): DM, htn and chol. Planned anesthesia: general. The patient has the following known anesthesia issues: none. Patients bleeding risk: no recent abnormal bleeding. Patient does not have objections to receiving blood products if needed.  The following portions of the patient's history were reviewed and updated as appropriate:  She  has a past medical history of Arthritis, Diabetes mellitus, Esophagitis, Hiatal hernia, Hx of colonic polyps ssp and adenoma (03/06/2018), OSA on CPAP, Sleep apnea, Thyroid disease, and TMJ (sprain of temporomandibular joint). She does not have any pertinent problems on file. She  has a past surgical history that includes Cholecystectomy; Abdominal hysterectomy; Back surgery; Tonsilectomy, adenoidectomy, bilateral myringotomy and tubes; Thyroid surgery; Back surgery; Esophagogastroduodenoscopy; ERCP; and Colonoscopy. Her family history includes Alcohol abuse in her mother; Breast cancer in her cousin and daughter; Heart disease in her maternal grandfather; Stroke in her maternal grandmother and mother. She  reports that she quit smoking about 19 months ago. She quit after 48.00 years of use. She has never used smokeless tobacco. She reports that she does not drink alcohol or use drugs. She has a current medication list which includes the following prescription(s): alprazolam, baclofen, one touch ultra 2, fluoxetine, glimepiride, hydrochlorothiazide, insulin glargine, insulin pen needle, levothyroxine, meloxicam, pantoprazole, tramadol, valacyclovir, and onetouch ultra, and the following Facility-Administered Medications: sodium chloride  and ipratropium-albuterol. Current Outpatient Medications on File Prior to Visit  Medication Sig Dispense Refill  . ALPRAZolam (XANAX) 0.5 MG tablet TAKE 1 TABLET BY MOUTH THREE TIMES A DAY AS NEEDED FOR ANXIETY OR SLEEP 90 tablet 0  . baclofen (LIORESAL) 10 MG tablet Take 10 mg by mouth 1 day or 1 dose.    . Blood Glucose Monitoring Suppl (ONE TOUCH ULTRA 2) w/Device KIT Check blood sugar as directed. Dx:E11.65 1 each 0  . FLUoxetine (PROZAC) 20 MG capsule TAKE ONE CAPSULE BY MOUTH DAILY 90 capsule 1  . glimepiride (AMARYL) 4 MG tablet TAKE ONE TABLET BY MOUTH DAILY WITH BREAKFAST 90 tablet 0  . hydrochlorothiazide (HYDRODIURIL) 25 MG tablet TAKE ONE TABLET BY MOUTH DAILY 90 tablet 1  . Insulin Glargine (LANTUS SOLOSTAR) 100 UNIT/ML Solostar Pen Inject 25 Units into the skin at bedtime. Needs ov/labs 15 mL 0  . Insulin Pen Needle (NOVOTWIST) 32G X 5 MM MISC Use daily at Bedtime with Lantus 100 each 0  . levothyroxine (SYNTHROID) 137 MCG tablet TAKE ONE TABLET BY MOUTH EVERY MORNING BEFORE BREAKFAST 90 tablet 1  . meloxicam (MOBIC) 15 MG tablet Take 15 mg by mouth 1 day or 1 dose.    . pantoprazole (PROTONIX) 40 MG tablet Take 1 tablet (40 mg total) by mouth daily. 30 tablet 11  . traMADol (ULTRAM) 50 MG tablet Take 1 tablet (50 mg total) by mouth every 8 (eight) hours as needed for pain. 30 tablet 0  . valACYclovir (VALTREX) 1000 MG tablet Take 1 tablet (1,000 mg total) by mouth daily as needed. 30 tablet 0   Current Facility-Administered Medications on File Prior to Visit  Medication Dose Route Frequency Provider Last Rate Last Dose  . 0.9 %  sodium chloride infusion  500 mL Intravenous Once Gatha Mayer, MD      .  ipratropium-albuterol (DUONEB) 0.5-2.5 (3) MG/3ML nebulizer solution 3 mL  3 mL Nebulization Q6H Roma Schanz R, DO       She is allergic to crestor [rosuvastatin calcium]..  Review of Systems Review of Systems  Constitutional: Negative for activity change, appetite  change and fatigue.  HENT: Negative for hearing loss, congestion, tinnitus and ear discharge.  dentist q61mEyes: Negative for visual disturbance (see optho q1y -- vision corrected to 20/20 with glasses).  Respiratory: Negative for cough, chest tightness and shortness of breath.   Cardiovascular: Negative for chest pain, palpitations and leg swelling.  Gastrointestinal: Negative for abdominal pain, diarrhea, constipation and abdominal distention.  Genitourinary: Negative for urgency, frequency, decreased urine volume and difficulty urinating.  Musculoskeletal: Negative  arthralgias and gait problem. +back pain  Skin: Negative for color change, pallor and rash.  Neurological: Negative for dizziness, light-headedness, numbness and headaches.  Hematological: Negative for adenopathy. Does not bruise/bleed easily.  Psychiatric/Behavioral: Negative for suicidal ideas, confusion, sleep disturbance, self-injury, dysphoric mood, decreased concentration and agitation.        Objective:    BP 115/71 (BP Location: Left Arm, Patient Position: Sitting, Cuff Size: Normal)   Pulse 95   Temp 98.3 F (36.8 C) (Oral)   Resp 18   Ht '4\' 11"'$  (1.499 m)   Wt 140 lb 9.6 oz (63.8 kg)   SpO2 96%   BMI 28.40 kg/m  General appearance: alert, cooperative, appears stated age and no distress Head: Normocephalic, without obvious abnormality, atraumatic Eyes: negative findings: lids and lashes normal and conjunctivae and sclerae normal Ears: normal TM's and external ear canals both ears Nose: Nares normal. Septum midline. Mucosa normal. No drainage or sinus tenderness. Throat: lips, mucosa, and tongue normal; teeth and gums normal Neck: no adenopathy, no carotid bruit, no JVD, supple, symmetrical, trachea midline and thyroid not enlarged, symmetric, no tenderness/mass/nodules Back: symmetric, no curvature. ROM normal. No CVA tenderness. Lungs: clear to auscultation bilaterally Breasts: normal appearance, no  masses or tenderness Heart: regular rate and rhythm, S1, S2 normal, no murmur, click, rub or gallop Abdomen: soft, non-tender; bowel sounds normal; no masses,  no organomegaly Pelvic: deferred Extremities: extremities normal, atraumatic, no cyanosis or edema Pulses: 2+ and symmetric Skin: Skin color, texture, turgor normal. No rashes or lesions Lymph nodes: Cervical, supraclavicular, and axillary nodes normal.   {Cardiographics ECG: normal sinus rhythm, no blocks or conduction defects, no ischemic changes Echocardiogram: not done  I Lab Review  Appointment on 09/23/2018  Component Date Value  . Sodium 09/23/2018 139   . Potassium 09/23/2018 3.8   . Chloride 09/23/2018 102   . CO2 09/23/2018 26   . Glucose, Bld 09/23/2018 107*  . BUN 09/23/2018 21   . Creatinine, Ser 09/23/2018 0.68   . Total Bilirubin 09/23/2018 0.4   . Alkaline Phosphatase 09/23/2018 86   . AST 09/23/2018 24   . ALT 09/23/2018 27   . Total Protein 09/23/2018 7.2   . Albumin 09/23/2018 3.8   . Calcium 09/23/2018 9.4   . GFR 09/23/2018 85.99   . TSH 09/23/2018 0.01*  . Hgb A1c MFr Bld 09/23/2018 10.5*  . Cholesterol 09/23/2018 156   . Triglycerides 09/23/2018 135.0   . HDL 09/23/2018 36.10*  . VLDL 09/23/2018 27.0   . LDL Cholesterol 09/23/2018 93   . Total CHOL/HDL Ratio 09/23/2018 4   . NonHDL 09/23/2018 120.11       Assessment:      68y.o. female with planned surgery as above.  Known risk factors for perioperative complications: Diabetes mellitus   Plan:    1. Preoperative workup as follows ECG, hemoglobin, hematocrit, electrolytes, creatinine, glucose, liver function studies, coagulation studies. 2. Change in medication regimen before surgery: per surgical team. 3.DM not controlled-- pt will need to f/u endo prior to surgery  5. Deep vein thrombosis prophylaxi postoperatively:regimen to be chosen by surgical team. 6. Surveillance for postoperative MI with ECG immediately postoperatively and  on postoperative days 1 and 2 AND troponin levels 24 hours postoperatively and on day 4 or hospital discharge (whichever comes first): at the discretion of anesthesiologist. 7. Other measures: consult with hospital team if necessary  Will need to see endocrinologist prior to having surgery  Lab Results  Component Value Date   HGBA1C 10.5 (H) 09/23/2018

## 2018-09-25 NOTE — Assessment & Plan Note (Signed)
Pt will increase lantus per endo instructions And she will f/u endo

## 2018-09-26 MED ORDER — ONETOUCH ULTRA 2 W/DEVICE KIT: PACK | 0 refills | Status: DC

## 2018-09-26 NOTE — Telephone Encounter (Signed)
Rx sent 

## 2018-09-27 ENCOUNTER — Other Ambulatory Visit: Payer: PPO

## 2018-09-27 MED ORDER — BLOOD GLUCOSE METER KIT: PACK | 0 refills | Status: DC

## 2018-09-27 NOTE — Telephone Encounter (Signed)
Kelly with [facility] calling and states that they do not make the One Touch Ultra 2 meter any longer. Would like to know if a prescription for the One Touch Vario Flex could be sent in place? If so, would need test strips sent also. Please advise.  CB#: [phone]

## 2018-09-27 NOTE — Telephone Encounter (Signed)
New script for glucometer and supplies faxed to [facility].

## 2018-09-27 NOTE — Addendum Note (Signed)
Addended byDamita Dunnings D on: 09/27/2018 03:11 PM   Modules accepted: Orders

## 2018-09-29 DIAGNOSIS — Z794 Long term (current) use of insulin: Secondary | ICD-10-CM | POA: Diagnosis not present

## 2018-09-29 DIAGNOSIS — E1165 Type 2 diabetes mellitus with hyperglycemia: Secondary | ICD-10-CM | POA: Diagnosis not present

## 2018-09-30 DIAGNOSIS — M25532 Pain in left wrist: Secondary | ICD-10-CM | POA: Diagnosis not present

## 2018-09-30 DIAGNOSIS — M25522 Pain in left elbow: Secondary | ICD-10-CM | POA: Diagnosis not present

## 2018-10-02 ENCOUNTER — Other Ambulatory Visit: Payer: Self-pay | Admitting: Family Medicine

## 2018-10-02 DIAGNOSIS — E1165 Type 2 diabetes mellitus with hyperglycemia: Secondary | ICD-10-CM

## 2018-10-02 DIAGNOSIS — E039 Hypothyroidism, unspecified: Secondary | ICD-10-CM

## 2018-10-04 ENCOUNTER — Other Ambulatory Visit: Payer: Self-pay

## 2018-10-04 MED ORDER — LEVOTHYROXINE SODIUM 125 MCG PO TABS: 125.0000 ug | ORAL_TABLET | Freq: Every day | ORAL | 2 refills | Status: DC

## 2018-10-13 DIAGNOSIS — E1165 Type 2 diabetes mellitus with hyperglycemia: Secondary | ICD-10-CM | POA: Diagnosis not present

## 2018-10-16 ENCOUNTER — Other Ambulatory Visit: Payer: Self-pay

## 2018-10-16 ENCOUNTER — Encounter (HOSPITAL_BASED_OUTPATIENT_CLINIC_OR_DEPARTMENT_OTHER): Payer: Self-pay | Admitting: *Deleted

## 2018-10-16 ENCOUNTER — Emergency Department (HOSPITAL_BASED_OUTPATIENT_CLINIC_OR_DEPARTMENT_OTHER)
Admission: EM | Admit: 2018-10-16 | Discharge: 2018-10-17 | Disposition: A | Discharge status: Home / Self Care | Payer: PPO | Attending: Emergency Medicine | Admitting: Emergency Medicine

## 2018-10-16 DIAGNOSIS — Z79899 Other long term (current) drug therapy: Secondary | ICD-10-CM | POA: Diagnosis not present

## 2018-10-16 DIAGNOSIS — Z794 Long term (current) use of insulin: Secondary | ICD-10-CM | POA: Diagnosis not present

## 2018-10-16 DIAGNOSIS — Z87891 Personal history of nicotine dependence: Secondary | ICD-10-CM | POA: Diagnosis not present

## 2018-10-16 DIAGNOSIS — E039 Hypothyroidism, unspecified: Secondary | ICD-10-CM | POA: Diagnosis not present

## 2018-10-16 DIAGNOSIS — E119 Type 2 diabetes mellitus without complications: Secondary | ICD-10-CM | POA: Insufficient documentation

## 2018-10-16 DIAGNOSIS — T383X1A Poisoning by insulin and oral hypoglycemic [antidiabetic] drugs, accidental (unintentional), initial encounter: Secondary | ICD-10-CM | POA: Diagnosis not present

## 2018-10-16 LAB — BASIC METABOLIC PANEL
Anion gap: 10 (ref 5–15)
BUN: 23 mg/dL (ref 8–23)
CO2: 25 mmol/L (ref 22–32)
Calcium: 9.4 mg/dL (ref 8.9–10.3)
Chloride: 103 mmol/L (ref 98–111)
Creatinine, Ser: 0.68 mg/dL (ref 0.44–1.00)
GFR calc Af Amer: >60 mL/min (ref >60)
GFR calc non Af Amer: >60 mL/min (ref >60)
Glucose, Bld: 82 mg/dL (ref 70–99)
Potassium: 3.4 mmol/L — ABNORMAL LOW (ref 3.5–5.1)
Sodium: 138 mmol/L (ref 135–145)

## 2018-10-16 LAB — CBG MONITORING, ED
Glucose-Capillary: 108 mg/dL — ABNORMAL HIGH (ref 70–99)
Glucose-Capillary: 81 mg/dL (ref 70–99)
Glucose-Capillary: 79 mg/dL (ref 70–99)

## 2018-10-16 NOTE — ED Triage Notes (Signed)
Pt reports switching her humalog and lantus.  Took 30units of humalog 1.5 hours PTA. Denies taking lantus.

## 2018-10-16 NOTE — ED Triage Notes (Signed)
Pt took 4 gluco tabs PTA.

## 2018-10-16 NOTE — ED Provider Notes (Signed)
[facility] EMERGENCY DEPARTMENT Provider Note   CSN: [id number] Arrival date & time: 10/16/18  2125     History   Chief Complaint Chief Complaint  Patient presents with  . Drug Overdose    HPI Chance A Burgard is a 68 y.o. female.     Patient with insulin-dependent diabetes presents to the emergency department with complaint of accidental medication administration.  Patient typically takes 3 units of Humalog with meals and 30 units of Lantus at bedtime.  When she went to take her nighttime Lantus, she accidentally took 30 units of Humalog.  Blood sugar was low 200s at home.  Patient took 4 glucose tabs.  Blood sugar low 100s upon ED arrival.  Patient reports no symptoms of recent illness.  No nausea, vomiting, diarrhea.  Right now she feels fine and does not feel like her blood sugar is low.  Onset of symptoms acute.  Course is constant.  Nothing makes symptoms better or worse.     Past Medical History:  Diagnosis Date  . Arthritis   . Diabetes mellitus   . Esophagitis   . Hiatal hernia   . Hx of colonic polyps ssp and adenoma 03/06/2018  . OSA on CPAP   . Sleep apnea   . Thyroid disease   . TMJ (sprain of temporomandibular joint)     Patient Active Problem List   Diagnosis Date Noted  . Hx of colonic polyps ssp and adenoma 03/06/2018  . Pink eye disease of left eye 12/27/2017  . Sinus headache 05/25/2017  . Headache above the eye region 05/25/2017  . Pre-op evaluation 02/08/2017  . Obstructive sleep apnea treated with continuous positive airway pressure (CPAP) 01/28/2017  . Urinary frequency 11/23/2016  . DM (diabetes mellitus) type II uncontrolled, periph vascular disorder ([place]) 11/23/2016  . Left lower quadrant pain 11/23/2016  . Chronic pansinusitis 10/12/2016  . Preventative health care 02/07/2016  . Cigarette nicotine dependence with nicotine-induced disorder 07/22/2015  . Hypersomnia with sleep apnea 07/22/2015  . Snoring 07/22/2015  . S/P UPPP  (uvulopalatopharyngoplasty) 07/22/2015  . Severe frontal headaches 07/22/2015  . Chronic ethmoidal sinusitis 07/22/2015  . OSA (obstructive sleep apnea) 07/08/2015  . Cervical osteoarthritis 11/23/2014  . Degeneration of intervertebral disc of cervical region 11/23/2014  . Cervical pain 11/23/2014  . OE (otitis externa) 12/09/2012  . Hypothyroidism 06/04/2010  . Diabetes mellitus type II, uncontrolled ([place]) 06/04/2010  . Hyperlipidemia LDL goal <70 06/04/2010  . OSTEOARTHRITIS 06/04/2010    Past Surgical History:  Procedure Laterality Date  . ABDOMINAL HYSTERECTOMY    . BACK SURGERY     x3  . BACK SURGERY     x 5 , 2 neck surgeries, 2 lower back surgeries  . CHOLECYSTECTOMY    . COLONOSCOPY    . ERCP    . ESOPHAGOGASTRODUODENOSCOPY    . THYROID SURGERY    . TONSILECTOMY, ADENOIDECTOMY, BILATERAL MYRINGOTOMY AND TUBES       OB History   No obstetric history on file.      Home Medications    Prior to Admission medications   Medication Sig Start Date End Date Taking? Authorizing Provider  ALPRAZolam Duanne Moron) 0.5 MG tablet TAKE 1 TABLET BY MOUTH THREE TIMES A DAY AS NEEDED FOR ANXIETY OR SLEEP 08/02/18   Carollee Herter, Alferd Apa, DO  baclofen (LIORESAL) 10 MG tablet Take 10 mg by mouth 1 day or 1 dose. 01/31/18   [provider]  blood glucose meter kit and supplies Dispense  based on patient and insurance preference. Use up to four times daily as directed. Dx: E11.9 09/27/18   [name], DO  Blood Glucose Monitoring Suppl (ONE TOUCH ULTRA 2) w/Device KIT Check blood sugar as directed. Dx:E11.65 09/26/18   Roma Schanz R, DO  FLUoxetine (PROZAC) 20 MG capsule TAKE ONE CAPSULE BY MOUTH DAILY 08/30/18   Carollee Herter, Alferd Apa, DO  glimepiride (AMARYL) 4 MG tablet TAKE ONE TABLET BY MOUTH DAILY WITH BREAKFAST 09/28/17   Carollee Herter, Yvonne R, DO  hydrochlorothiazide (HYDRODIURIL) 25 MG tablet TAKE ONE TABLET BY MOUTH DAILY 09/16/18   Carollee Herter, Alferd Apa, DO   Insulin Glargine (LANTUS SOLOSTAR) 100 UNIT/ML Solostar Pen Inject 25 Units into the skin at bedtime. Needs ov/labs 02/21/18   [name], DO  Insulin Pen Needle (NOVOTWIST) 32G X 5 MM MISC Use daily at Bedtime with Lantus 02/17/12   Carollee Herter, Alferd Apa, DO  levothyroxine (SYNTHROID) 125 MCG tablet Take 1 tablet (125 mcg total) by mouth daily. 10/04/18   [name], DO  meloxicam (MOBIC) 15 MG tablet Take 15 mg by mouth 1 day or 1 dose. 02/08/18   [provider]  ONETOUCH ULTRA test strip USE TO CHECK BLOOD SUGAR FOUR TIMES A DAY AS DIRECTED 09/23/18   Carollee Herter, Alferd Apa, DO  pantoprazole (PROTONIX) 40 MG tablet Take 1 tablet (40 mg total) by mouth daily. 08/06/14   [name], DO  traMADol (ULTRAM) 50 MG tablet Take 1 tablet (50 mg total) by mouth every 8 (eight) hours as needed for pain. 12/09/12   [name], DO  valACYclovir (VALTREX) 1000 MG tablet Take 1 tablet (1,000 mg total) by mouth daily as needed. 08/03/17   Mosie Lukes, MD    Family History Family History  Problem Relation Age of Onset  . Stroke Mother   . Alcohol abuse Mother   . Stroke Maternal Grandmother   . Heart disease Maternal Grandfather   . Breast cancer Cousin   . Breast cancer Daughter   . Colon cancer Neg Hx   . Esophageal cancer Neg Hx   . Rectal cancer Neg Hx   . Stomach cancer Neg Hx     Social History Social History   Tobacco Use  . Smoking status: Former Smoker    Years: 48.00    Quit date: 02/01/2017    Years since quitting: 1.7  . Smokeless tobacco: Never Used  Substance Use Topics  . Alcohol use: No    Alcohol/week: 0.0 standard drinks  . Drug use: No     Allergies   Crestor [rosuvastatin calcium]   Review of Systems Review of Systems  Constitutional: Negative for fever.  HENT: Negative for rhinorrhea and sore throat.   Eyes: Negative for redness.  Respiratory: Negative for cough.   Cardiovascular: Negative for chest pain.   Gastrointestinal: Negative for abdominal pain, diarrhea, nausea and vomiting.  Genitourinary: Negative for dysuria.  Musculoskeletal: Negative for myalgias.  Skin: Negative for rash.  Neurological: Negative for headaches.     Physical Exam Updated Vital Signs BP 129/86 (BP Location: Left Arm)   Pulse 91   Temp 98 F (36.7 C) (Oral)   Resp 18   Ht _0  (1.499 m)   Wt 62.1 kg   SpO2 98%   BMI 27.67 kg/m   Physical Exam Vitals signs and nursing note reviewed.  Constitutional:      Appearance: She is  well-developed.  HENT:     Head: Normocephalic and atraumatic.  Eyes:     Conjunctiva/sclera: Conjunctivae normal.  Neck:     Musculoskeletal: Normal range of motion and neck supple.  Cardiovascular:     Rate and Rhythm: Normal rate.  Pulmonary:     Effort: No respiratory distress.  Abdominal:     General: Abdomen is flat.     Palpations: Abdomen is soft.     Tenderness: There is no abdominal tenderness. There is no guarding or rebound.  Skin:    General: Skin is warm and dry.  Neurological:     Mental Status: She is alert.      ED Treatments / Results  Labs (all labs ordered are listed, but only abnormal results are displayed) Labs Reviewed  BASIC METABOLIC PANEL - Abnormal; Notable for the following components:      Result Value   Potassium 3.4 (*)    All other components within normal limits  CBG MONITORING, ED - Abnormal; Notable for the following components:   Glucose-Capillary 108 (*)    All other components within normal limits  CBG MONITORING, ED  CBG MONITORING, ED    EKG None  Radiology No results found.  Procedures Procedures (including critical care time)  Medications Ordered in ED Medications - No data to display   Initial Impression / Assessment and Plan / ED Course  I have reviewed the triage vital signs and the nursing notes.  Pertinent labs & imaging results that were available during my care of the patient were reviewed by me  and considered in my medical decision making (see chart for details).        Patient seen and examined. Work-up initiated.  Will monitor blood sugars.  Will check electrolytes.  Per up-to-date, peak effect of Humalog is 2.4 to 2.8 hours.  Onset 45 minutes.  She will be need to be monitored. Currently drinking orange juice.    Vital signs reviewed and are as follows: BP 129/86 (BP Location: Left Arm)   Pulse 91   Temp 98 F (36.7 C) (Oral)   Resp 18   Ht _0  (1.499 m)   Wt 62.1 kg   SpO2 98%   BMI 27.67 kg/m   10:55 PM Glucose 81. K=3.4.   Continue to monitor. Handoff to Dr. Dina Rich at shift change.   Final Clinical Impressions(s) / ED Diagnoses   Final diagnoses:  Insulin overdose, accidental or unintentional, initial encounter   Anticipate d/c when cbg stabilized.   ED Discharge Orders    None       Carlisle Cater, PA-C 10/16/18 [place], [place], DO 10/16/18 2343

## 2018-10-17 LAB — CBG MONITORING, ED
Glucose-Capillary: 97 mg/dL (ref 70–99)
Glucose-Capillary: 97 mg/dL (ref 70–99)

## 2018-10-17 NOTE — ED Provider Notes (Signed)
  Patient signed out pending repeat blood sugars.  Briefly, patient accidentally took 30 units of her short acting insulin instead of 30 units of Lantus.  Patient has been monitored for approximately 4 hours.  She is tolerating fluids without difficulty and has been asymptomatic.  Sequential blood sugar monitoring with a nadir of 79.  The last 2 have been 97.  Patient wishes to be discharged home.  She is asymptomatic on exam.  Given that she has been monitored for 4 hours without any significant hypoglycemic event, I feel we have passed the peak onset of action of her short acting insulin.  She will to be discharged home but I have encouraged her to drink another glass of orange juice prior to going to bed.  I did offer her an amp of D50 which she declined.  Do not feel strongly that she needs this given that she has not required any intervention to this point.  After history, exam, and medical workup I feel the patient has been appropriately medically screened and is safe for discharge home. Pertinent diagnoses were discussed with the patient. Patient was given return precautions.   Physical Exam  BP 122/88   Pulse 84   Temp 98 F (36.7 C) (Oral)   Resp 18   Ht 1.499 m (4\' 11" )   Wt 62.1 kg   SpO2 96%   BMI 27.67 kg/m   Physical Exam Awake, alert, no acute distress  ED Course/Procedures     Procedures  MDM   Accidental insulin overdose.       Merryl Hacker, MD 10/17/18 [phone]

## 2018-10-17 NOTE — Discharge Instructions (Addendum)
You were seen today after an accidental insulin overdose.  When you get home make sure to drink another glass of orange juice.  Be mindful to double check insulin prior to injection.

## 2018-10-19 NOTE — Telephone Encounter (Signed)
Dr. Rennis Harding at [facility] requesting surgical clearance now that patient A1C is 7.0 please fax clearance to [phone], patient would like a follow up call today regarding the status.   Patient states initially her A1C was 10.0 and now that it dropped to 7.0 she would like this request to be expedited due to surgery being [facility] in the past.

## 2018-10-21 DIAGNOSIS — M960 Pseudarthrosis after fusion or arthrodesis: Secondary | ICD-10-CM | POA: Diagnosis not present

## 2018-10-21 DIAGNOSIS — T84216A Breakdown (mechanical) of internal fixation device of vertebrae, initial encounter: Secondary | ICD-10-CM | POA: Diagnosis not present

## 2018-10-21 DIAGNOSIS — M5106 Intervertebral disc disorders with myelopathy, lumbar region: Secondary | ICD-10-CM | POA: Diagnosis not present

## 2018-10-21 DIAGNOSIS — M4326 Fusion of spine, lumbar region: Secondary | ICD-10-CM | POA: Diagnosis not present

## 2018-10-24 ENCOUNTER — Telehealth: Payer: Self-pay

## 2018-10-24 DIAGNOSIS — Z1159 Encounter for screening for other viral diseases: Secondary | ICD-10-CM | POA: Diagnosis not present

## 2018-10-24 DIAGNOSIS — M960 Pseudarthrosis after fusion or arthrodesis: Secondary | ICD-10-CM | POA: Diagnosis not present

## 2018-10-24 DIAGNOSIS — Z01812 Encounter for preprocedural laboratory examination: Secondary | ICD-10-CM | POA: Diagnosis not present

## 2018-10-24 NOTE — Telephone Encounter (Signed)
Copied from [place] [id number]. Topic: General - Other >> Oct 24, 2018 10:39 AM Rainey Pines A wrote: Patient stated that [facility] needs a copy of patients EKG faxed to [phone] attention :Jackelyn Poling

## 2018-10-24 NOTE — Telephone Encounter (Signed)
EKG sent.

## 2018-10-27 NOTE — Telephone Encounter (Signed)
Joy from [facility] stated they only received clearance for pt and they still need EKG results/office notes. Requesting callback today [phone]   Fax # is [phone], please attention to [place].

## 2018-10-28 NOTE — Telephone Encounter (Signed)
Caller name: Jackelyn Poling Relation to pt: [facility]  Call back number: [phone] fax # [phone]    Reason for call:  Patient surgery is Monday, anesthesiologist requesting EKG report today, Attention Debbie fax # [phone].

## 2018-10-28 NOTE — Telephone Encounter (Signed)
ekg report faxed to [place] at fax number provided

## 2018-10-31 DIAGNOSIS — M4316 Spondylolisthesis, lumbar region: Secondary | ICD-10-CM | POA: Diagnosis not present

## 2018-10-31 DIAGNOSIS — T84216A Breakdown (mechanical) of internal fixation device of vertebrae, initial encounter: Secondary | ICD-10-CM | POA: Diagnosis not present

## 2018-10-31 DIAGNOSIS — G894 Chronic pain syndrome: Secondary | ICD-10-CM | POA: Diagnosis not present

## 2018-10-31 DIAGNOSIS — Z79899 Other long term (current) drug therapy: Secondary | ICD-10-CM | POA: Diagnosis not present

## 2018-10-31 DIAGNOSIS — Z87891 Personal history of nicotine dependence: Secondary | ICD-10-CM | POA: Diagnosis not present

## 2018-10-31 DIAGNOSIS — M4327 Fusion of spine, lumbosacral region: Secondary | ICD-10-CM | POA: Diagnosis not present

## 2018-10-31 DIAGNOSIS — M960 Pseudarthrosis after fusion or arthrodesis: Secondary | ICD-10-CM | POA: Diagnosis not present

## 2018-10-31 DIAGNOSIS — Z791 Long term (current) use of non-steroidal anti-inflammatories (NSAID): Secondary | ICD-10-CM | POA: Diagnosis not present

## 2018-10-31 DIAGNOSIS — G4733 Obstructive sleep apnea (adult) (pediatric): Secondary | ICD-10-CM | POA: Diagnosis not present

## 2018-10-31 DIAGNOSIS — M48061 Spinal stenosis, lumbar region without neurogenic claudication: Secondary | ICD-10-CM | POA: Diagnosis not present

## 2018-10-31 DIAGNOSIS — M5416 Radiculopathy, lumbar region: Secondary | ICD-10-CM | POA: Diagnosis not present

## 2018-10-31 DIAGNOSIS — M4712 Other spondylosis with myelopathy, cervical region: Secondary | ICD-10-CM | POA: Diagnosis not present

## 2018-10-31 DIAGNOSIS — M48062 Spinal stenosis, lumbar region with neurogenic claudication: Secondary | ICD-10-CM | POA: Diagnosis not present

## 2018-10-31 DIAGNOSIS — M4326 Fusion of spine, lumbar region: Secondary | ICD-10-CM | POA: Diagnosis not present

## 2018-10-31 DIAGNOSIS — E119 Type 2 diabetes mellitus without complications: Secondary | ICD-10-CM | POA: Diagnosis not present

## 2018-10-31 DIAGNOSIS — I10 Essential (primary) hypertension: Secondary | ICD-10-CM | POA: Diagnosis not present

## 2018-10-31 DIAGNOSIS — M5106 Intervertebral disc disorders with myelopathy, lumbar region: Secondary | ICD-10-CM | POA: Diagnosis not present

## 2018-11-01 MED ORDER — PANTOPRAZOLE SODIUM 40 MG PO TBEC
40.00 | DELAYED_RELEASE_TABLET | ORAL | Status: DC
Start: 2018-11-02 — End: 2018-11-01

## 2018-11-01 MED ORDER — DIPHENHYDRAMINE HCL 25 MG PO CAPS
25.00 | ORAL_CAPSULE | ORAL | Status: DC
Start: ? — End: 2018-11-01

## 2018-11-01 MED ORDER — ACETAMINOPHEN 325 MG PO TABS
650.00 | ORAL_TABLET | ORAL | Status: DC
Start: ? — End: 2018-11-01

## 2018-11-01 MED ORDER — ONDANSETRON HCL 4 MG/2ML IJ SOLN
4.00 | INTRAMUSCULAR | Status: DC
Start: ? — End: 2018-11-01

## 2018-11-01 MED ORDER — SENNOSIDES-DOCUSATE SODIUM 8.6-50 MG PO TABS
1.00 | ORAL_TABLET | ORAL | Status: DC
Start: 2018-11-01 — End: 2018-11-01

## 2018-11-01 MED ORDER — POTASSIUM CHLORIDE IN NACL 20-0.9 MEQ/L-% IV SOLN
100.00 | INTRAVENOUS | Status: DC
Start: ? — End: 2018-11-01

## 2018-11-01 MED ORDER — GLUCOSE 40 % PO GEL
15.00 | ORAL | Status: DC
Start: ? — End: 2018-11-01

## 2018-11-01 MED ORDER — GENERIC EXTERNAL MEDICATION
Status: DC
Start: ? — End: 2018-11-01

## 2018-11-01 MED ORDER — DIPHENHYDRAMINE HCL 50 MG/ML IJ SOLN
25.00 | INTRAMUSCULAR | Status: DC
Start: ? — End: 2018-11-01

## 2018-11-01 MED ORDER — GLUCAGON HCL RDNA (DIAGNOSTIC) 1 MG IJ SOLR
1.00 | INTRAMUSCULAR | Status: DC
Start: ? — End: 2018-11-01

## 2018-11-01 MED ORDER — FLUOXETINE HCL 20 MG PO CAPS
20.00 | ORAL_CAPSULE | ORAL | Status: DC
Start: 2018-11-02 — End: 2018-11-01

## 2018-11-01 MED ORDER — ALPRAZOLAM 0.5 MG PO TABS
0.50 | ORAL_TABLET | ORAL | Status: DC
Start: ? — End: 2018-11-01

## 2018-11-01 MED ORDER — MORPHINE SULFATE 2 MG/ML IJ SOLN
2.00 | INTRAMUSCULAR | Status: DC
Start: ? — End: 2018-11-01

## 2018-11-01 MED ORDER — TRAMADOL HCL 50 MG PO TABS
50.00 | ORAL_TABLET | ORAL | Status: DC
Start: ? — End: 2018-11-01

## 2018-11-01 MED ORDER — NALOXONE HCL 0.4 MG/ML IJ SOLN
0.10 | INTRAMUSCULAR | Status: DC
Start: ? — End: 2018-11-01

## 2018-11-01 MED ORDER — HYDROCHLOROTHIAZIDE 25 MG PO TABS
25.00 | ORAL_TABLET | ORAL | Status: DC
Start: 2018-11-02 — End: 2018-11-01

## 2018-11-01 MED ORDER — TAMSULOSIN HCL 0.4 MG PO CAPS
0.40 | ORAL_CAPSULE | ORAL | Status: DC
Start: 2018-11-02 — End: 2018-11-01

## 2018-11-01 MED ORDER — KETOROLAC TROMETHAMINE 15 MG/ML IJ SOLN
15.00 | INTRAMUSCULAR | Status: DC
Start: ? — End: 2018-11-01

## 2018-11-01 MED ORDER — PHENOL 1.4 % MT LIQD
1.00 | OROMUCOSAL | Status: DC
Start: ? — End: 2018-11-01

## 2018-11-01 MED ORDER — OXYCODONE-ACETAMINOPHEN 5-325 MG PO TABS
1.00 | ORAL_TABLET | ORAL | Status: DC
Start: ? — End: 2018-11-01

## 2018-11-01 MED ORDER — DEXTROSE 10 % IV SOLN
125.00 | INTRAVENOUS | Status: DC
Start: ? — End: 2018-11-01

## 2018-11-01 MED ORDER — METHOCARBAMOL 500 MG PO TABS
500.00 | ORAL_TABLET | ORAL | Status: DC
Start: 2018-11-01 — End: 2018-11-01

## 2018-11-01 MED ORDER — INSULIN LISPRO 100 UNIT/ML ~~LOC~~ SOLN
1.00 | SUBCUTANEOUS | Status: DC
Start: 2018-11-01 — End: 2018-11-01

## 2018-11-01 MED ORDER — LEVOTHYROXINE SODIUM 125 MCG PO TABS
125.00 | ORAL_TABLET | ORAL | Status: DC
Start: 2018-11-02 — End: 2018-11-01

## 2018-11-07 DIAGNOSIS — G4733 Obstructive sleep apnea (adult) (pediatric): Secondary | ICD-10-CM | POA: Diagnosis not present

## 2018-11-29 DIAGNOSIS — M545 Low back pain: Secondary | ICD-10-CM | POA: Diagnosis not present

## 2018-11-29 DIAGNOSIS — M4326 Fusion of spine, lumbar region: Secondary | ICD-10-CM | POA: Diagnosis not present

## 2018-11-29 DIAGNOSIS — Z4689 Encounter for fitting and adjustment of other specified devices: Secondary | ICD-10-CM | POA: Diagnosis not present

## 2018-11-29 DIAGNOSIS — M5106 Intervertebral disc disorders with myelopathy, lumbar region: Secondary | ICD-10-CM | POA: Diagnosis not present

## 2018-11-29 DIAGNOSIS — M960 Pseudarthrosis after fusion or arthrodesis: Secondary | ICD-10-CM | POA: Diagnosis not present

## 2018-11-30 ENCOUNTER — Other Ambulatory Visit: Payer: Self-pay | Admitting: Family Medicine

## 2018-11-30 DIAGNOSIS — F419 Anxiety disorder, unspecified: Secondary | ICD-10-CM

## 2018-11-30 NOTE — Telephone Encounter (Signed)
Requesting: Xanax Contract: 12/27/2017 UDS: 12/27/2017, low risk, next screening 12/28/2018 Last OV: 09/23/2018 Next OV: 12/26/2018 Last Refill: 08/02/2018, #[id number] RF Database:   Please advise

## 2018-12-07 ENCOUNTER — Telehealth: Payer: Self-pay | Admitting: *Deleted

## 2018-12-07 NOTE — Telephone Encounter (Signed)
Copied from [place] [id number]. Topic: Appointment Scheduling - Scheduling Inquiry for Clinic >> Dec 07, 2018  8:46 AM Reyne Dumas L wrote: Reason for CRM:  Pt has an appointment on Monday 09/21 and she would like to know if she can get her flu shot that day and if so can her husband also get one [name] 08/03/1946).  Pt states she has had back surgery and it is hard to get out of the house and she would like to just do everything at one time. Pt can be reached at [phone]

## 2018-12-07 NOTE — Telephone Encounter (Signed)
Left detailed message on machine that we are unable to give her husbands shot on the same day.  We do not have nurse visits or flu clinic slots open that day. Advised to move appt to a day that has nurse visits open or flu shot clinic open.

## 2018-12-13 ENCOUNTER — Other Ambulatory Visit: Payer: Self-pay | Admitting: Family Medicine

## 2018-12-17 ENCOUNTER — Ambulatory Visit: Payer: PPO

## 2018-12-26 ENCOUNTER — Other Ambulatory Visit: Payer: Self-pay

## 2018-12-26 ENCOUNTER — Encounter: Payer: Self-pay | Admitting: Family Medicine

## 2018-12-26 ENCOUNTER — Ambulatory Visit (INDEPENDENT_AMBULATORY_CARE_PROVIDER_SITE_OTHER): Payer: PPO | Admitting: Family Medicine

## 2018-12-26 VITALS — BP 104/70 | HR 87 | Temp 97.3°F | Resp 18 | Ht 59.0 in | Wt 141.8 lb

## 2018-12-26 DIAGNOSIS — E1165 Type 2 diabetes mellitus with hyperglycemia: Secondary | ICD-10-CM

## 2018-12-26 DIAGNOSIS — E039 Hypothyroidism, unspecified: Secondary | ICD-10-CM | POA: Diagnosis not present

## 2018-12-26 DIAGNOSIS — H9193 Unspecified hearing loss, bilateral: Secondary | ICD-10-CM

## 2018-12-26 DIAGNOSIS — E785 Hyperlipidemia, unspecified: Secondary | ICD-10-CM

## 2018-12-26 DIAGNOSIS — E1169 Type 2 diabetes mellitus with other specified complication: Secondary | ICD-10-CM | POA: Diagnosis not present

## 2018-12-26 LAB — COMPREHENSIVE METABOLIC PANEL
ALT: 18 U/L (ref 0–35)
AST: 18 U/L (ref 0–37)
Albumin: 3.8 g/dL (ref 3.5–5.2)
Alkaline Phosphatase: 86 U/L (ref 39–117)
BUN: 23 mg/dL (ref 6–23)
CO2: 31 mEq/L (ref 19–32)
Calcium: 9.5 mg/dL (ref 8.4–10.5)
Chloride: 101 mEq/L (ref 96–112)
Creatinine, Ser: 0.7 mg/dL (ref 0.40–1.20)
GFR: 83.1 mL/min (ref >60.00)
Glucose, Bld: 180 mg/dL — ABNORMAL HIGH (ref 70–99)
Potassium: 4.2 mEq/L (ref 3.5–5.1)
Sodium: 139 mEq/L (ref 135–145)
Total Bilirubin: 0.3 mg/dL (ref 0.2–1.2)
Total Protein: 7.3 g/dL (ref 6.0–8.3)

## 2018-12-26 LAB — TSH: TSH: 0.06 u[IU]/mL — ABNORMAL LOW (ref 0.35–4.50)

## 2018-12-26 LAB — HEMOGLOBIN A1C: Hgb A1c MFr Bld: 8.3 % — ABNORMAL HIGH (ref 4.6–6.5)

## 2018-12-26 LAB — LIPID PANEL
Cholesterol: 168 mg/dL (ref 0–200)
HDL: 37.9 mg/dL — ABNORMAL LOW (ref >39.00)
LDL Cholesterol: 101 mg/dL — ABNORMAL HIGH (ref 0–99)
NonHDL: 130.28
Total CHOL/HDL Ratio: 4
Triglycerides: 144 mg/dL (ref 0.0–149.0)
VLDL: 28.8 mg/dL (ref 0.0–40.0)

## 2018-12-26 NOTE — Patient Instructions (Signed)
Carbohydrate Counting for Diabetes Mellitus, Adult  Carbohydrate counting is a method of keeping track of how many carbohydrates you eat. Eating carbohydrates naturally increases the amount of sugar (glucose) in the blood. Counting how many carbohydrates you eat helps keep your blood glucose within normal limits, which helps you manage your diabetes (diabetes mellitus). It is important to know how many carbohydrates you can safely have in each meal. This is different for every person. A diet and nutrition specialist (registered dietitian) can help you make a meal plan and calculate how many carbohydrates you should have at each meal and snack. Carbohydrates are found in the following foods:  Grains, such as breads and cereals.  Dried beans and soy products.  Starchy vegetables, such as potatoes, peas, and corn.  Fruit and fruit juices.  Milk and yogurt.  Sweets and snack foods, such as cake, cookies, candy, chips, and soft drinks. How do I count carbohydrates? There are two ways to count carbohydrates in food. You can use either of the methods or a combination of both. Reading "Nutrition Facts" on packaged food The "Nutrition Facts" list is included on the labels of almost all packaged foods and beverages in the [place] It includes:  The serving size.  Information about nutrients in each serving, including the grams (g) of carbohydrate per serving. To use the "Nutrition Facts":  Decide how many servings you will have.  Multiply the number of servings by the number of carbohydrates per serving.  The resulting number is the total amount of carbohydrates that you will be having. Learning standard serving sizes of other foods When you eat carbohydrate foods that are not packaged or do not include "Nutrition Facts" on the label, you need to measure the servings in order to count the amount of carbohydrates:  Measure the foods that you will eat with a food scale or measuring cup, if needed.   Decide how many standard-size servings you will eat.  Multiply the number of servings by 15. Most carbohydrate-rich foods have about 15 g of carbohydrates per serving. ? For example, if you eat 8 oz (170 g) of strawberries, you will have eaten 2 servings and 30 g of carbohydrates (2 servings x 15 g = 30 g).  For foods that have more than one food mixed, such as soups and casseroles, you must count the carbohydrates in each food that is included. The following list contains standard serving sizes of common carbohydrate-rich foods. Each of these servings has about 15 g of carbohydrates:   hamburger bun or  English muffin.   oz (15 mL) syrup.   oz (14 g) jelly.  1 slice of bread.  1 six-inch tortilla.  3 oz (85 g) cooked rice or pasta.  4 oz (113 g) cooked dried beans.  4 oz (113 g) starchy vegetable, such as peas, corn, or potatoes.  4 oz (113 g) hot cereal.  4 oz (113 g) mashed potatoes or  of a large baked potato.  4 oz (113 g) canned or frozen fruit.  4 oz (120 mL) fruit juice.  4-6 crackers.  6 chicken nuggets.  6 oz (170 g) unsweetened dry cereal.  6 oz (170 g) plain fat-free yogurt or yogurt sweetened with artificial sweeteners.  8 oz (240 mL) milk.  8 oz (170 g) fresh fruit or one small piece of fruit.  24 oz (680 g) popped popcorn. Example of carbohydrate counting Sample meal  3 oz (85 g) chicken breast.  6 oz (170 g)   brown rice.  4 oz (113 g) corn.  8 oz (240 mL) milk.  8 oz (170 g) strawberries with sugar-free whipped topping. Carbohydrate calculation 1. Identify the foods that contain carbohydrates: ? Rice. ? Corn. ? Milk. ? Strawberries. 2. Calculate how many servings you have of each food: ? 2 servings rice. ? 1 serving corn. ? 1 serving milk. ? 1 serving strawberries. 3. Multiply each number of servings by 15 g: ? 2 servings rice x 15 g = 30 g. ? 1 serving corn x 15 g = 15 g. ? 1 serving milk x 15 g = 15 g. ? 1 serving  strawberries x 15 g = 15 g. 4. Add together all of the amounts to find the total grams of carbohydrates eaten: ? 30 g + 15 g + 15 g + 15 g = 75 g of carbohydrates total. Summary  Carbohydrate counting is a method of keeping track of how many carbohydrates you eat.  Eating carbohydrates naturally increases the amount of sugar (glucose) in the blood.  Counting how many carbohydrates you eat helps keep your blood glucose within normal limits, which helps you manage your diabetes.  A diet and nutrition specialist (registered dietitian) can help you make a meal plan and calculate how many carbohydrates you should have at each meal and snack. This information is not intended to replace advice given to you by your health care provider. Make sure you discuss any questions you have with your health care provider. Document Released: 03/23/2005 Document Revised: 10/15/2016 Document Reviewed: 09/04/2015 Elsevier Patient Education  2020 [employer]  

## 2018-12-26 NOTE — Assessment & Plan Note (Signed)
Stable con't meds--- check labs

## 2018-12-26 NOTE — Assessment & Plan Note (Signed)
Encouraged heart healthy diet, increase exercise, avoid trans fats, consider a krill oil cap daily 

## 2018-12-26 NOTE — Progress Notes (Signed)
- Patient ID: [name], female    DOB: [birth year]  Age: 68 y.o. MRN: [id number]    Subjective:  Subjective  HPI [name] presents for f/u thyroid , diabetes and cholesterol.   She is also c/o her ears feeling clogged and dec hearing b/l .    Review of Systems  Constitutional: Negative for appetite change, diaphoresis, fatigue and unexpected weight change.  HENT: Positive for hearing loss. Negative for congestion.   Eyes: Negative for pain, redness and visual disturbance.  Respiratory: Negative for cough, chest tightness, shortness of breath and wheezing.   Cardiovascular: Negative for chest pain, palpitations and leg swelling.  Endocrine: Negative for cold intolerance, heat intolerance, polydipsia, polyphagia and polyuria.  Genitourinary: Negative for difficulty urinating, dysuria and frequency.  Neurological: Negative for dizziness, light-headedness, numbness and headaches.    History Past Medical History:  Diagnosis Date  . Arthritis   . Diabetes mellitus   . Esophagitis   . Hiatal hernia   . Hx of colonic polyps ssp and adenoma 03/06/2018  . OSA on CPAP   . Sleep apnea   . Thyroid disease   . TMJ (sprain of temporomandibular joint)     She has a past surgical history that includes Cholecystectomy; Abdominal hysterectomy; Back surgery; Tonsilectomy, adenoidectomy, bilateral myringotomy and tubes; Thyroid surgery; Back surgery; Esophagogastroduodenoscopy; ERCP; and Colonoscopy.   Her family history includes Alcohol abuse in her mother; Breast cancer in her cousin and daughter; Heart disease in her maternal grandfather; Stroke in her maternal grandmother and mother.She reports that she quit smoking about 22 months ago. She quit after 48.00 years of use. She has never used smokeless tobacco. She reports that she does not drink alcohol or use drugs.  Current Outpatient Medications on File Prior to Visit  Medication Sig Dispense Refill  . ALPRAZolam (XANAX) 0.5 MG tablet  TAKE ONE TABLET BY MOUTH THREE TIMES A DAY AS NEEDED FOR ANXIETY OR SLEEP 90 tablet 0  . baclofen (LIORESAL) 10 MG tablet Take 10 mg by mouth 1 day or 1 dose.    . blood glucose meter kit and supplies Dispense based on patient and insurance preference. Use up to four times daily as directed. Dx: E11.9 1 each 0  . Blood Glucose Monitoring Suppl (ONE TOUCH ULTRA 2) w/Device KIT Check blood sugar as directed. Dx:E11.65 1 kit 0  . FLUoxetine (PROZAC) 20 MG capsule TAKE ONE CAPSULE BY MOUTH DAILY 90 capsule 1  . glimepiride (AMARYL) 4 MG tablet TAKE ONE TABLET BY MOUTH DAILY WITH BREAKFAST 90 tablet 0  . hydrochlorothiazide (HYDRODIURIL) 25 MG tablet TAKE ONE TABLET BY MOUTH DAILY 90 tablet 1  . Insulin Glargine (LANTUS SOLOSTAR) 100 UNIT/ML Solostar Pen Inject 25 Units into the skin at bedtime. Needs ov/labs 15 mL 0  . Insulin Pen Needle (NOVOTWIST) 32G X 5 MM MISC Use daily at Bedtime with Lantus 100 each 0  . levothyroxine (SYNTHROID) 125 MCG tablet Take 1 tablet (125 mcg total) by mouth daily. 30 tablet 2  . meloxicam (MOBIC) 15 MG tablet Take 15 mg by mouth 1 day or 1 dose.    Glory Rosebush ULTRA test strip USE TO CHECK BLOOD SUGAR FOUR TIMES A DAY AS DIRECTED 400 strip 1  . pantoprazole (PROTONIX) 40 MG tablet Take 1 tablet (40 mg total) by mouth daily. 30 tablet 11  . traMADol (ULTRAM) 50 MG tablet Take 1 tablet (50 mg total) by mouth every 8 (eight) hours as needed for pain. [place]  tablet 0  . valACYclovir (VALTREX) 1000 MG tablet Take 1 tablet (1,000 mg total) by mouth daily as needed. 30 tablet 0   Current Facility-Administered Medications on File Prior to Visit  Medication Dose Route Frequency Provider Last Rate Last Dose  . 0.9 %  sodium chloride infusion  500 mL Intravenous Once Gatha Mayer, MD      . ipratropium-albuterol (DUONEB) 0.5-2.5 (3) MG/3ML nebulizer solution 3 mL  3 mL Nebulization Q6H Lowne Chase, Kendrick Fries R, DO         Objective:  Objective  Physical Exam Vitals signs and  nursing note reviewed.  Constitutional:      Appearance: She is well-developed.  HENT:     Head: Normocephalic and atraumatic.     Right Ear: Tympanic membrane, ear canal and external ear normal. There is no impacted cerumen.     Left Ear: Tympanic membrane, ear canal and external ear normal. There is no impacted cerumen.  Eyes:     Conjunctiva/sclera: Conjunctivae normal.  Neck:     Musculoskeletal: Normal range of motion and neck supple.     Thyroid: No thyromegaly.     Vascular: No carotid bruit or JVD.  Cardiovascular:     Rate and Rhythm: Normal rate and regular rhythm.     Heart sounds: Normal heart sounds. No murmur.  Pulmonary:     Effort: Pulmonary effort is normal. No respiratory distress.     Breath sounds: Normal breath sounds. No wheezing or rales.  Chest:     Chest wall: No tenderness.  Neurological:     Mental Status: She is alert and oriented to person, place, and time.    BP 104/70 (BP Location: Right Arm, Patient Position: Sitting, Cuff Size: Normal)   Pulse 87   Temp (!) 97.3 F (36.3 C) (Temporal)   Resp 18   Ht '4\' 11"'$  (1.499 m)   Wt 141 lb 12.8 oz (64.3 kg)   SpO2 95%   BMI 28.64 kg/m  Wt Readings from Last 3 Encounters:  12/26/18 141 lb 12.8 oz (64.3 kg)  10/16/18 137 lb (62.1 kg)  09/23/18 140 lb 9.6 oz (63.8 kg)     Lab Results  Component Value Date   WBC 7.5 05/25/2017   HGB 11.8 (L) 05/25/2017   HCT 36.0 05/25/2017   PLT 298.0 05/25/2017   GLUCOSE 82 10/16/2018   CHOL 156 09/23/2018   TRIG 135.0 09/23/2018   HDL 36.10 (L) 09/23/2018   LDLDIRECT 162.0 03/28/2015   LDLCALC 93 09/23/2018   ALT 27 09/23/2018   AST 24 09/23/2018   NA 138 10/16/2018   K 3.4 (L) 10/16/2018   CL 103 10/16/2018   CREATININE 0.68 10/16/2018   BUN 23 10/16/2018   CO2 25 10/16/2018   TSH 0.01 (L) 09/23/2018   HGBA1C 10.5 (H) 09/23/2018   MICROALBUR 0.8 02/08/2017    No results found.   Assessment & Plan:  Plan  I am having Maebell A. Hoppe maintain  her Insulin Pen Needle, traMADol, pantoprazole, valACYclovir, glimepiride, Insulin Glargine, baclofen, meloxicam, hydrochlorothiazide, OneTouch Ultra, ONE TOUCH ULTRA 2, blood glucose meter kit and supplies, levothyroxine, ALPRAZolam, and FLUoxetine. We will continue to administer ipratropium-albuterol and sodium chloride.  No orders of the defined types were placed in this encounter.   Problem List Items Addressed This Visit      Unprioritized   Diabetes mellitus type II, uncontrolled ([place]) - Primary    hgba1c to be checked, minimize simple carbs. Increase exercise as  tolerated. Continue current meds       Relevant Orders   Hemoglobin A1c   Comprehensive metabolic panel   Hyperlipidemia LDL goal <70    Encouraged heart healthy diet, increase exercise, avoid trans fats, consider a krill oil cap daily      Hypothyroidism    Stable con't meds--- check labs      Relevant Orders   TSH    Other Visit Diagnoses    Hyperlipidemia associated with type 2 diabetes mellitus ([place])       Relevant Orders   Lipid panel   Comprehensive metabolic panel   Bilateral hearing loss, unspecified hearing loss type       Relevant Orders   Ambulatory referral to Audiology      Follow-up: Return in about 6 months (around 06/25/2019), or if symptoms worsen or fail to improve, for annual exam, fasting.  [name], DO

## 2018-12-26 NOTE — Assessment & Plan Note (Signed)
hgba1c to be checked, minimize simple carbs. Increase exercise as tolerated. Continue current meds  

## 2018-12-31 ENCOUNTER — Ambulatory Visit (INDEPENDENT_AMBULATORY_CARE_PROVIDER_SITE_OTHER): Payer: PPO

## 2018-12-31 DIAGNOSIS — Z23 Encounter for immunization: Secondary | ICD-10-CM | POA: Diagnosis not present

## 2019-01-02 ENCOUNTER — Encounter: Payer: Self-pay | Admitting: *Deleted

## 2019-01-02 ENCOUNTER — Other Ambulatory Visit: Payer: Self-pay | Admitting: *Deleted

## 2019-01-02 MED ORDER — LEVOTHYROXINE SODIUM 112 MCG PO TABS: 112.0000 ug | ORAL_TABLET | Freq: Every day | ORAL | 2 refills | Status: DC

## 2019-01-03 DIAGNOSIS — H524 Presbyopia: Secondary | ICD-10-CM | POA: Diagnosis not present

## 2019-01-03 DIAGNOSIS — D3132 Benign neoplasm of left choroid: Secondary | ICD-10-CM | POA: Diagnosis not present

## 2019-01-03 DIAGNOSIS — H25813 Combined forms of age-related cataract, bilateral: Secondary | ICD-10-CM | POA: Diagnosis not present

## 2019-01-03 DIAGNOSIS — E119 Type 2 diabetes mellitus without complications: Secondary | ICD-10-CM | POA: Diagnosis not present

## 2019-01-10 ENCOUNTER — Telehealth: Payer: Self-pay | Admitting: *Deleted

## 2019-01-10 ENCOUNTER — Other Ambulatory Visit: Payer: Self-pay | Admitting: Family Medicine

## 2019-01-10 DIAGNOSIS — M1990 Unspecified osteoarthritis, unspecified site: Secondary | ICD-10-CM

## 2019-01-10 MED ORDER — MELOXICAM 15 MG PO TABS: 15.0000 mg | ORAL_TABLET | ORAL | 1 refills | Status: DC

## 2019-01-10 NOTE — Telephone Encounter (Signed)
[facility] faxed a request for meloxicam over.  We never wrote this for her.  I spoke with patient and she stated that she was getting this form [facility] for arthritis and she is no longer seeing them.  She would like to get a 90 day supply as she takes this every day.  Do you want to take over refills?  Meloxicam 15mg   1po qd

## 2019-01-10 NOTE — Telephone Encounter (Signed)
I sent it in 

## 2019-01-10 NOTE — Telephone Encounter (Signed)
Patient notified that rx has been sent in. 

## 2019-01-18 DIAGNOSIS — H9313 Tinnitus, bilateral: Secondary | ICD-10-CM | POA: Diagnosis not present

## 2019-01-18 DIAGNOSIS — E119 Type 2 diabetes mellitus without complications: Secondary | ICD-10-CM | POA: Diagnosis not present

## 2019-01-18 DIAGNOSIS — H903 Sensorineural hearing loss, bilateral: Secondary | ICD-10-CM | POA: Diagnosis not present

## 2019-02-02 DIAGNOSIS — M545 Low back pain: Secondary | ICD-10-CM | POA: Diagnosis not present

## 2019-02-08 ENCOUNTER — Other Ambulatory Visit: Payer: Self-pay | Admitting: Family Medicine

## 2019-02-08 DIAGNOSIS — F419 Anxiety disorder, unspecified: Secondary | ICD-10-CM

## 2019-02-08 NOTE — Telephone Encounter (Signed)
Requesting: Xanax Contract: 12/27/2017 UDS: 12/27/2017 Last OV: 12/26/2018 Next OV: 07/03/2019 Last Refill: 12/01/2018 Database:   Please advise

## 2019-02-15 DIAGNOSIS — M25562 Pain in left knee: Secondary | ICD-10-CM | POA: Diagnosis not present

## 2019-02-15 DIAGNOSIS — M1712 Unilateral primary osteoarthritis, left knee: Secondary | ICD-10-CM | POA: Diagnosis not present

## 2019-02-24 DIAGNOSIS — G4733 Obstructive sleep apnea (adult) (pediatric): Secondary | ICD-10-CM | POA: Diagnosis not present

## 2019-02-27 ENCOUNTER — Ambulatory Visit: Payer: Self-pay | Admitting: *Deleted

## 2019-02-27 NOTE — Telephone Encounter (Signed)
Pt scheduled for virtual tomorrow afternoon. FYI

## 2019-02-27 NOTE — Telephone Encounter (Signed)
She has an endocrinologist She should call him --- dr Buddy Duty

## 2019-02-27 NOTE — Telephone Encounter (Signed)
Pt called stating she has been experiencing high blood sugar. Pt states she normally runs about 134, but about 2-3 weeks ago it started being 207 early in the morning without eating. Pt states she noticed it is higher with eating little to nothing; she stopped glimepiride the 1st of July 2020; she is taking 30 lantus and night, and 4 units of lispro after each meal; the pt says she has gained 12 lbs since May 2020; her cbg was 346 at 1000 02/26/2019; recommendations made per nurse triage protocol; she verbalized understanding; pt sees Dr Etter Sjogren, [place]; pt transferred to [facility] for scheduling.  Reason for Disposition . [1] Caller has NON-URGENT medication or insulin pump question AND [2] triager unable to answer question  Answer Assessment - Initial Assessment Questions 1. BLOOD GLUCOSE: "What is your blood glucose level?"      202 2. ONSET: "When did you check the blood glucose?"     0800 02/27/2019 3. USUAL RANGE: "What is your glucose level usually?" (e.g., usual fasting morning value, usual evening value)   120-130s 4. KETONES: "Do you check for ketones (urine or blood test strips)?" If yes, ask: "What does the test show now?"    no 5. TYPE 1 or 2:  "Do you know what type of diabetes you have?"  (e.g., Type 1, Type 2, Gestational; doesn't know)      Type 2 6. INSULIN: "Do you take insulin?" "What type of insulin(s) do you use? What is the mode of delivery? (syringe, pen; injection or pump)?"     Pen lantus 30 units at night, and lispro 4 units after each meal 7. DIABETES PILLS: "Do you take any pills for your diabetes?" If yes, ask: "Have you missed taking any pills recently?"   no 8. OTHER SYMPTOMS: "Do you have any symptoms?" (e.g., fever, frequent urination, difficulty breathing, dizziness, weakness, vomiting)   no 9. PREGNANCY: "Is there any chance you are pregnant?" "When was your last menstrual period?"   no  Protocols used: DIABETES - HIGH BLOOD SUGAR-A-AH

## 2019-02-28 ENCOUNTER — Encounter: Payer: Self-pay | Admitting: Family Medicine

## 2019-02-28 ENCOUNTER — Ambulatory Visit (INDEPENDENT_AMBULATORY_CARE_PROVIDER_SITE_OTHER): Payer: PPO | Admitting: Family Medicine

## 2019-02-28 ENCOUNTER — Other Ambulatory Visit: Payer: Self-pay

## 2019-02-28 DIAGNOSIS — E119 Type 2 diabetes mellitus without complications: Secondary | ICD-10-CM

## 2019-02-28 DIAGNOSIS — F419 Anxiety disorder, unspecified: Secondary | ICD-10-CM | POA: Diagnosis not present

## 2019-02-28 DIAGNOSIS — Z794 Long term (current) use of insulin: Secondary | ICD-10-CM | POA: Diagnosis not present

## 2019-02-28 DIAGNOSIS — M25562 Pain in left knee: Secondary | ICD-10-CM | POA: Diagnosis not present

## 2019-02-28 MED ORDER — LANTUS SOLOSTAR 100 UNIT/ML ~~LOC~~ SOPN: PEN_INJECTOR | SUBCUTANEOUS | 0 refills | Status: DC

## 2019-02-28 MED ORDER — ALPRAZOLAM 0.5 MG PO TABS: ORAL_TABLET | ORAL | 0 refills | Status: DC

## 2019-02-28 NOTE — Progress Notes (Signed)
Virtual Visit via Telephone Note  I connected with Raquell A Rufino on 02/28/19 at  4:00 PM EST by telephone and verified that I am speaking with the correct person using two identifiers.  Location: Patient: home with her husband  Provider: office   I discussed the limitations, risks, security and privacy concerns of performing an evaluation and management service by telephone and the availability of in person appointments. I also discussed with the patient that there may be a patient responsible charge related to this service. The patient expressed understanding and agreed to proceed.   History of Present Illness: Pt is home c/o bs running high but she sees Dr Buddy Duty for this and will call him.  She also states she needs a refill on her xanax but she picked up #90 on 11/5.  She is not taking more than usual and she is not out yet.    Observations/Objective: No vitals obtained Pt is in NAD  Assessment and Plan: 1. Diabetes mellitus, type II, insulin dependent (HCC) Pt will call Dr Buddy Duty  - Insulin Glargine (LANTUS SOLOSTAR) 100 UNIT/ML Solostar Pen; 30 u sq qpm  Dispense: 15 mL; Refill: 0  2. Anxiety Pt can get a refill 12/5    Follow Up Instructions:    I discussed the assessment and treatment plan with the patient. The patient was provided an opportunity to ask questions and all were answered. The patient agreed with the plan and demonstrated an understanding of the instructions.   The patient was advised to call back or seek an in-person evaluation if the symptoms worsen or if the condition fails to improve as anticipated.  I provided 15 minutes of non-face-to-face time during this encounter.   [name], DO

## 2019-03-01 DIAGNOSIS — E1165 Type 2 diabetes mellitus with hyperglycemia: Secondary | ICD-10-CM | POA: Diagnosis not present

## 2019-03-07 DIAGNOSIS — L821 Other seborrheic keratosis: Secondary | ICD-10-CM | POA: Diagnosis not present

## 2019-03-07 DIAGNOSIS — D225 Melanocytic nevi of trunk: Secondary | ICD-10-CM | POA: Diagnosis not present

## 2019-03-08 DIAGNOSIS — M25562 Pain in left knee: Secondary | ICD-10-CM | POA: Diagnosis not present

## 2019-03-16 ENCOUNTER — Other Ambulatory Visit: Payer: Self-pay | Admitting: Family Medicine

## 2019-03-18 ENCOUNTER — Other Ambulatory Visit: Payer: Self-pay | Admitting: Family Medicine

## 2019-03-27 DIAGNOSIS — M4326 Fusion of spine, lumbar region: Secondary | ICD-10-CM | POA: Diagnosis not present

## 2019-03-27 DIAGNOSIS — M960 Pseudarthrosis after fusion or arthrodesis: Secondary | ICD-10-CM | POA: Diagnosis not present

## 2019-03-27 DIAGNOSIS — M79671 Pain in right foot: Secondary | ICD-10-CM | POA: Diagnosis not present

## 2019-03-27 DIAGNOSIS — G894 Chronic pain syndrome: Secondary | ICD-10-CM | POA: Diagnosis not present

## 2019-03-27 DIAGNOSIS — Z79891 Long term (current) use of opiate analgesic: Secondary | ICD-10-CM | POA: Diagnosis not present

## 2019-03-27 DIAGNOSIS — Z79899 Other long term (current) drug therapy: Secondary | ICD-10-CM | POA: Diagnosis not present

## 2019-03-27 DIAGNOSIS — M545 Low back pain: Secondary | ICD-10-CM | POA: Diagnosis not present

## 2019-03-27 DIAGNOSIS — Z6826 Body mass index (BMI) 26.0-26.9, adult: Secondary | ICD-10-CM | POA: Diagnosis not present

## 2019-04-01 ENCOUNTER — Other Ambulatory Visit: Payer: Self-pay | Admitting: Family Medicine

## 2019-04-03 ENCOUNTER — Other Ambulatory Visit: Payer: Self-pay

## 2019-04-03 ENCOUNTER — Ambulatory Visit (INDEPENDENT_AMBULATORY_CARE_PROVIDER_SITE_OTHER): Payer: PPO | Admitting: Family Medicine

## 2019-04-03 ENCOUNTER — Encounter: Payer: Self-pay | Admitting: Family Medicine

## 2019-04-03 DIAGNOSIS — J0140 Acute pansinusitis, unspecified: Secondary | ICD-10-CM

## 2019-04-03 MED ORDER — FLUTICASONE PROPIONATE 50 MCG/ACT NA SUSP: 2.0000 | Freq: Every day | NASAL | 6 refills | Status: DC

## 2019-04-03 MED ORDER — AMOXICILLIN 875 MG PO TABS: 875.0000 mg | ORAL_TABLET | Freq: Two times a day (BID) | ORAL | 0 refills | Status: DC

## 2019-04-03 NOTE — Progress Notes (Signed)
Virtual Visit via Video Note  I connected with [name] on 04/03/19 at  4:00 PM EST by a video enabled telemedicine application and verified that I am speaking with the correct person using two identifiers.  Location: Patient: home with her husband Provider: office    I discussed the limitations of evaluation and management by telemedicine and the availability of in person appointments. The patient expressed understanding and agreed to proceed.  History of Present Illness: Pt is home c/o sinus infection   She c/o sinus pressure and drainage,  Yellow / green mucus Low grade fever + cough , productive +teeth pain   Observations/Objective:  99.8  And sinus pressure x 3 days   She tried otc sinus tablet --  And cough meds with some relief  Assessment and Plan: 1. Acute recurrent pansinusitis abx per orders flonase and antihistamine otc Pt to get covid tested  - amoxicillin (AMOXIL) 875 MG tablet; Take 1 tablet (875 mg total) by mouth 2 (two) times daily.  Dispense: 20 tablet; Refill: 0 - fluticasone (FLONASE) 50 MCG/ACT nasal spray; Place 2 sprays into both nostrils daily.  Dispense: 16 g; Refill: 6  Follow Up Instructions:    I discussed the assessment and treatment plan with the patient. The patient was provided an opportunity to ask questions and all were answered. The patient agreed with the plan and demonstrated an understanding of the instructions.   The patient was advised to call back or seek an in-person evaluation if the symptoms worsen or if the condition fails to improve as anticipated.  I provided 15 minutes of non-face-to-face time during this encounter.   [name], DO

## 2019-04-04 ENCOUNTER — Ambulatory Visit: Payer: PPO | Attending: Internal Medicine

## 2019-04-04 DIAGNOSIS — Z20828 Contact with and (suspected) exposure to other viral communicable diseases: Secondary | ICD-10-CM | POA: Diagnosis not present

## 2019-04-04 DIAGNOSIS — Z20822 Contact with and (suspected) exposure to covid-19: Secondary | ICD-10-CM

## 2019-04-05 LAB — NOVEL CORONAVIRUS, NAA: SARS-CoV-2, NAA: NOT DETECTED

## 2019-04-15 ENCOUNTER — Other Ambulatory Visit: Payer: Self-pay | Admitting: Family Medicine

## 2019-04-17 ENCOUNTER — Other Ambulatory Visit: Payer: Self-pay | Admitting: Family Medicine

## 2019-04-17 ENCOUNTER — Telehealth: Payer: Self-pay | Admitting: Family Medicine

## 2019-04-17 DIAGNOSIS — J0140 Acute pansinusitis, unspecified: Secondary | ICD-10-CM

## 2019-04-17 MED ORDER — DOXYCYCLINE HYCLATE 100 MG PO TABS: 100.0000 mg | ORAL_TABLET | Freq: Two times a day (BID) | ORAL | 0 refills | Status: DC

## 2019-04-17 NOTE — Telephone Encounter (Signed)
Pt seen on 04/03/19 for concern. Negative COVID. Per Rod Holler patient sounded bad and patient stated the amoxicillin didn't help.

## 2019-04-17 NOTE — Telephone Encounter (Signed)
Patient is calling to report that she is still not feeling better. She had a covid test that was negative. And she has finished the amoxicillin (AMOXIL) 875 MG tablet [[id number]] . Patient is wanting some advise. Please advise Cb- [phone]

## 2019-04-17 NOTE — Telephone Encounter (Signed)
Doxycycline sent to pharmacy [facility] if no better or resp clinic

## 2019-04-18 NOTE — Telephone Encounter (Signed)
Pt.notified

## 2019-04-24 DIAGNOSIS — H903 Sensorineural hearing loss, bilateral: Secondary | ICD-10-CM | POA: Diagnosis not present

## 2019-05-08 DIAGNOSIS — H903 Sensorineural hearing loss, bilateral: Secondary | ICD-10-CM | POA: Diagnosis not present

## 2019-05-24 ENCOUNTER — Ambulatory Visit (INDEPENDENT_AMBULATORY_CARE_PROVIDER_SITE_OTHER): Payer: PPO | Admitting: Family Medicine

## 2019-05-24 ENCOUNTER — Encounter: Payer: Self-pay | Admitting: Family Medicine

## 2019-05-24 DIAGNOSIS — J0140 Acute pansinusitis, unspecified: Secondary | ICD-10-CM

## 2019-05-24 MED ORDER — DOXYCYCLINE HYCLATE 100 MG PO TABS: 100.0000 mg | ORAL_TABLET | Freq: Two times a day (BID) | ORAL | 0 refills | Status: DC

## 2019-05-24 NOTE — Progress Notes (Signed)
Virtual Visit via Video Note  I connected with [name] on 05/24/19 at  9:20 AM EST by a video enabled telemedicine application and verified that I am speaking with the correct person using two identifiers.  Location: Patient: home with husband  Provider: home    I discussed the limitations of evaluation and management by telemedicine and the availability of in person appointments. The patient expressed understanding and agreed to proceed.  History of Present Illness: Pt is home with her husband c/o fever, sinus congestion x 3 days -- she also has a sore throat No sob    Observations/Objective Vitals:   05/24/19 0914  Temp: 99.9 F (37.7 C)   Pt is in NAD   Assessment and Plan: 1. Acute non-recurrent pansinusitis abx per orders  Pt did not want nasal spray or cough med Tylenol prn fever Get covid test  - doxycycline (VIBRA-TABS) 100 MG tablet; Take 1 tablet (100 mg total) by mouth 2 (two) times daily.  Dispense: 20 tablet; Refill: 0   Follow Up Instructions:    I discussed the assessment and treatment plan with the patient. The patient was provided an opportunity to ask questions and all were answered. The patient agreed with the plan and demonstrated an understanding of the instructions.   The patient was advised to call back or seek an in-person evaluation if the symptoms worsen or if the condition fails to improve as anticipated.  I provided 25 minutes of non-face-to-face time during this encounter.   [name], DO

## 2019-05-30 ENCOUNTER — Telehealth: Payer: Self-pay | Admitting: Family Medicine

## 2019-05-30 NOTE — Progress Notes (Signed)
  Chronic Care Management   Note  05/30/2019 Name: URA YINGLING MRN: [id number] DOB: [birth year]  [name] is a 69 y.o. year old female who is a primary care patient of [name], DO. I reached out to [employer] by phone today in response to a referral sent by [name]'s PCP, [name], Alferd Apa, DO.   [name] was given information about Chronic Care Management services today including:  1. CCM service includes personalized support from designated clinical staff supervised by her physician, including individualized plan of care and coordination with other care providers 2. 24/7 contact phone numbers for assistance for urgent and routine care needs. 3. Service will only be billed when office clinical staff spend 20 minutes or more in a month to coordinate care. 4. Only one practitioner may furnish and bill the service in a calendar month. 5. The patient may stop CCM services at any time (effective at the end of the month) by phone call to the office staff. 6. The patient will be responsible for cost sharing (co-pay) of up to 20% of the service fee (after annual deductible is met).  Patient did not agree to services and wishes to consider information provided before deciding about enrollment in care management services.   Follow up plan:   [name] [employer] Scheduler

## 2019-05-31 ENCOUNTER — Other Ambulatory Visit: Payer: Self-pay | Admitting: Family Medicine

## 2019-05-31 DIAGNOSIS — M1990 Unspecified osteoarthritis, unspecified site: Secondary | ICD-10-CM

## 2019-06-05 DIAGNOSIS — M960 Pseudarthrosis after fusion or arthrodesis: Secondary | ICD-10-CM | POA: Diagnosis not present

## 2019-06-05 DIAGNOSIS — G894 Chronic pain syndrome: Secondary | ICD-10-CM | POA: Diagnosis not present

## 2019-06-05 DIAGNOSIS — M545 Low back pain: Secondary | ICD-10-CM | POA: Diagnosis not present

## 2019-06-05 DIAGNOSIS — M4326 Fusion of spine, lumbar region: Secondary | ICD-10-CM | POA: Diagnosis not present

## 2019-06-28 NOTE — Progress Notes (Signed)
Nurse connected with patient 06/29/19 at  1:00 PM EDT by a telephone enabled telemedicine application and verified that I am speaking with the correct person using two identifiers. Patient stated full name and DOB. Patient gave permission to continue with virtual visit. Patient's location was at home and Nurse's location was at [place] office.   Subjective:   [name] is a 69 y.o. female who presents for an Initial Medicare Annual Wellness Visit.  The Patient was informed that the wellness visit is to identify future health risk and educate and initiate measures that can reduce risk for increased disease through the lifespan.   Describes health as fair, good or great?good   Review of Systems     Home Safety/Smoke Alarms: Feels safe in home. Smoke alarms in place.  Lives with husband in 1 story home. Daughter lives next door.  Female:   Mammo- declines at this time due to pandemic      Dexa scan- declines at this time due to pandemic            CCS-02/24/18. Recall 5 yrs. Eye- [place] yearly. UTD per pt. Every August.     Objective:     Advanced Directives 06/29/2019 10/16/2018 11/30/2017  Does Patient Have a Medical Advance Directive? No No No  Would patient like information on creating a medical advance directive? No - Patient declined - -    Current Medications (verified) Outpatient Encounter Medications as of 06/29/2019  Medication Sig  . ALPRAZolam (XANAX) 0.5 MG tablet 1 po tid prn  . baclofen (LIORESAL) 10 MG tablet Take 10 mg by mouth 1 day or 1 dose.  . blood glucose meter kit and supplies Dispense based on patient and insurance preference. Use up to four times daily as directed. Dx: E11.9  . Blood Glucose Monitoring Suppl (ONE TOUCH ULTRA 2) w/Device KIT Check blood sugar as directed. Dx:E11.65  . FLUoxetine (PROZAC) 20 MG capsule TAKE ONE CAPSULE BY MOUTH DAILY  . fluticasone (FLONASE) 50 MCG/ACT nasal spray Place 2 sprays into both nostrils daily.  Marland Kitchen glimepiride  (AMARYL) 4 MG tablet TAKE ONE TABLET BY MOUTH DAILY WITH BREAKFAST  . hydrochlorothiazide (HYDRODIURIL) 25 MG tablet TAKE ONE TABLET BY MOUTH DAILY  . Insulin Glargine (LANTUS SOLOSTAR) 100 UNIT/ML Solostar Pen 30 u sq qpm  . Insulin Pen Needle (NOVOTWIST) 32G X 5 MM MISC Use daily at Bedtime with Lantus  . levothyroxine (SYNTHROID) 112 MCG tablet TAKE ONE TABLET BY MOUTH EVERY MORNING BEFORE BREAKFAST  . meloxicam (MOBIC) 15 MG tablet TAKE ONE TABLET BY MOUTH DAILY  . ONETOUCH ULTRA test strip USE TO CHECK BLOOD SUGAR FOUR TIMES A DAY AS DIRECTED  . pantoprazole (PROTONIX) 40 MG tablet Take 1 tablet (40 mg total) by mouth daily.  . traMADol (ULTRAM) 50 MG tablet Take 1 tablet (50 mg total) by mouth every 8 (eight) hours as needed for pain.  . valACYclovir (VALTREX) 1000 MG tablet Take 1 tablet (1,000 mg total) by mouth daily as needed.  Marland Kitchen amoxicillin (AMOXIL) 875 MG tablet Take 1 tablet (875 mg total) by mouth 2 (two) times daily. (Patient not taking: Reported on 05/24/2019)  . [DISCONTINUED] doxycycline (VIBRA-TABS) 100 MG tablet Take 1 tablet (100 mg total) by mouth 2 (two) times daily.   Facility-Administered Encounter Medications as of 06/29/2019  Medication  . 0.9 %  sodium chloride infusion  . ipratropium-albuterol (DUONEB) 0.5-2.5 (3) MG/3ML nebulizer solution 3 mL    Allergies (verified) Crestor [rosuvastatin calcium] and Hydrocodone-acetaminophen  History: Past Medical History:  Diagnosis Date  . Arthritis   . Diabetes mellitus   . Esophagitis   . Hiatal hernia   . Hx of colonic polyps ssp and adenoma 03/06/2018  . OSA on CPAP   . Sleep apnea   . Thyroid disease   . TMJ (sprain of temporomandibular joint)    Past Surgical History:  Procedure Laterality Date  . ABDOMINAL HYSTERECTOMY    . BACK SURGERY     x3  . BACK SURGERY     x 5 , 2 neck surgeries, 2 lower back surgeries  . CHOLECYSTECTOMY    . COLONOSCOPY    . ERCP    . ESOPHAGOGASTRODUODENOSCOPY    . THYROID  SURGERY    . TONSILECTOMY, ADENOIDECTOMY, BILATERAL MYRINGOTOMY AND TUBES     Family History  Problem Relation Age of Onset  . Stroke Mother   . Alcohol abuse Mother   . Stroke Maternal Grandmother   . Heart disease Maternal Grandfather   . Breast cancer Cousin   . Breast cancer Daughter   . Colon cancer Neg Hx   . Esophageal cancer Neg Hx   . Rectal cancer Neg Hx   . Stomach cancer Neg Hx    Social History   Socioeconomic History  . Marital status: Married    Spouse name: Not on file  . Number of children: Not on file  . Years of education: Not on file  . Highest education level: Not on file  Occupational History  . Occupation: [employer] trader: [employer]  Tobacco Use  . Smoking status: Former Smoker    Years: 48.00    Quit date: 02/01/2017    Years since quitting: 2.4  . Smokeless tobacco: Never Used  Substance and Sexual Activity  . Alcohol use: No    Alcohol/week: 0.0 standard drinks  . Drug use: No  . Sexual activity: Yes    Partners: Male  Other Topics Concern  . Not on file  Social History Narrative   Exercise--  3 flts steps a day for 8 hours   Social Determinants of Health   Financial Resource Strain: Low Risk   . Difficulty of Paying Living Expenses: Not hard at all  Food Insecurity: No Food Insecurity  . Worried About Charity fundraiser in the Last Year: Never true  . Ran Out of Food in the Last Year: Never true  Transportation Needs: No Transportation Needs  . Lack of Transportation (Medical): No  . Lack of Transportation (Non-Medical): No  Physical Activity:   . Days of Exercise per Week:   . Minutes of Exercise per Session:   Stress:   . Feeling of Stress :   Social Connections:   . Frequency of Communication with Friends and Family:   . Frequency of Social Gatherings with Friends and Family:   . Attends Religious Services:   . Active Member of Clubs or Organizations:   . Attends Archivist Meetings:   Marland Kitchen  Marital Status:     Tobacco Counseling Counseling given: Not Answered   Clinical Intake: Pain : No/denies pain     Activities of Daily Living In your present state of health, do you have any difficulty performing the following activities: 06/29/2019  Hearing? Y  Comment wears hearing aids.  Vision? N  Difficulty concentrating or making decisions? N  Walking or climbing stairs? N  Dressing or bathing? N  Doing errands, shopping? N  Preparing  Food and eating ? N  Using the Toilet? N  In the past six months, have you accidently leaked urine? N  Do you have problems with loss of bowel control? N  Managing your Medications? N  Managing your Finances? N  Housekeeping or managing your Housekeeping? N  Some recent data might be hidden     Immunizations and Health Maintenance Immunization History  Administered Date(s) Administered  . DTaP 01/24/1979  . Fluad Quad(high Dose 65+) 12/31/2018  . Influenza Whole 01/22/2010, 02/05/2012  . Influenza, High Dose Seasonal PF 01/29/2017, 02/14/2018  . Influenza,inj,Quad PF,6+ Mos 02/19/2014  . Influenza-Unspecified 01/17/2015, 01/28/2016  . Pneumococcal Conjugate-13 07/08/2015  . Pneumococcal Polysaccharide-23 01/05/2009, 02/19/2014  . Td 01/24/2007  . Zoster 02/16/2012   Health Maintenance Due  Topic Date Due  . TETANUS/TDAP  01/23/2017  . OPHTHALMOLOGY EXAM  01/28/2017  . FOOT EXAM  02/06/2017  . URINE MICROALBUMIN  02/08/2018  . PNA vac Low Risk Adult (2 of 2 - PPSV23) 02/20/2019  . HEMOGLOBIN A1C  06/25/2019    Patient Care Team: Carollee Herter, Alferd Apa, DO as PCP - General (Family Medicine) Starling Manns, MD (Orthopedic Surgery) [facility], [facility] Delrae Rend, MD as Consulting Physician (Endocrinology)  Indicate any recent Medical Services you may have received from other than [facility] providers in the past year (date may be approximate).     Assessment:   This is a routine wellness examination for Imane.  Physical assessment deferred to PCP.  Hearing/Vision screen Unable to assess. This visit is enabled though telemedicine due to Covid 19.   Dietary issues and exercise activities discussed: Current Exercise Habits: Home exercise routine, Type of exercise: walking, Time (Minutes): 30, Frequency (Times/Week): 3, Weekly Exercise (Minutes/Week): 90, Intensity: Mild, Exercise limited by: None identified Diet (meal preparation, eat out, water intake, caffeinated beverages, dairy products, fruits and vegetables): in general, a "healthy" diet  , well balanced  Goals    . Maintain healthy lifestyle      Depression Screen PHQ 2/9 Scores 06/29/2019 02/08/2017 02/07/2016 07/08/2015 02/16/2012  PHQ - 2 Score 0 0 0 1 0    Fall Risk Fall Risk  06/29/2019 07/30/2017 02/08/2017 07/08/2015  Falls in the past year? 0 No No No  Number falls in past yr: 0 - - -  Injury with Fall? 0 - - -  Follow up Education provided;Falls prevention discussed - - -   Cognitive Function: Ad8 score reviewed for issues:  Issues making decisions:no  Less interest in hobbies / activities:no  Repeats questions, stories (family complaining):no  Trouble using ordinary gadgets (microwave, computer, phone):no  Forgets the month or year: no  Mismanaging finances: no  Remembering appts:no  Daily problems with thinking and/or memory:no Ad8 score is=0         Screening Tests Health Maintenance  Topic Date Due  . TETANUS/TDAP  01/23/2017  . OPHTHALMOLOGY EXAM  01/28/2017  . FOOT EXAM  02/06/2017  . URINE MICROALBUMIN  02/08/2018  . PNA vac Low Risk Adult (2 of 2 - PPSV23) 02/20/2019  . HEMOGLOBIN A1C  06/25/2019  . MAMMOGRAM  06/28/2020 (Originally 02/20/2016)  . COLONOSCOPY  02/25/2023  . INFLUENZA VACCINE  Completed  . DEXA SCAN  Completed  . Hepatitis C Screening  Completed     Plan:    Please schedule your next medicare wellness visit with me in 1 yr.  Continue to eat heart healthy diet (full of fruits,  vegetables, whole grains, lean protein, water--limit salt, fat,  and sugar intake) and increase physical activity as tolerated.  Continue doing brain stimulating activities (puzzles, reading, adult coloring books, staying active) to keep memory sharp.   Bring a copy of your living will and/or healthcare power of attorney to your next office visit.    I have personally reviewed and noted the following in the patient's chart:   . Medical and social history . Use of alcohol, tobacco or illicit drugs  . Current medications and supplements . Functional ability and status . Nutritional status . Physical activity . Advanced directives . List of other physicians . Hospitalizations, surgeries, and ER visits in previous 12 months . Vitals . Screenings to include cognitive, depression, and falls . Referrals and appointments  In addition, I have reviewed and discussed with patient certain preventive protocols, quality metrics, and best practice recommendations. A written personalized care plan for preventive services as well as general preventive health recommendations were provided to patient.     Naaman Plummer [place], [place]   06/29/2019

## 2019-06-29 ENCOUNTER — Ambulatory Visit (INDEPENDENT_AMBULATORY_CARE_PROVIDER_SITE_OTHER): Payer: PPO | Admitting: *Deleted

## 2019-06-29 ENCOUNTER — Encounter: Payer: Self-pay | Admitting: *Deleted

## 2019-06-29 ENCOUNTER — Other Ambulatory Visit: Payer: Self-pay

## 2019-06-29 DIAGNOSIS — Z0000 Encounter for general adult medical examination without abnormal findings: Secondary | ICD-10-CM | POA: Diagnosis not present

## 2019-06-29 NOTE — Patient Instructions (Addendum)
Please schedule your next medicare wellness visit with me in 1 yr.  Continue to eat heart healthy diet (full of fruits, vegetables, whole grains, lean protein, water--limit salt, fat, and sugar intake) and increase physical activity as tolerated.  Continue doing brain stimulating activities (puzzles, reading, adult coloring books, staying active) to keep memory sharp.   Bring a copy of your living will and/or healthcare power of attorney to your next office visit.   [name] , Thank you for taking time to come for your Medicare Wellness Visit. I appreciate your ongoing commitment to your health goals. Please review the following plan we discussed and let me know if I can assist you in the future.   These are the goals we discussed: Goals    . Maintain healthy lifestyle       This is a list of the screening recommended for you and due dates:  Health Maintenance  Topic Date Due  . Tetanus Vaccine  01/23/2017  . Eye exam for diabetics  01/28/2017  . Complete foot exam   02/06/2017  . Urine Protein Check  02/08/2018  . Pneumonia vaccines (2 of 2 - PPSV23) 02/20/2019  . Hemoglobin A1C  06/25/2019  . Mammogram  06/28/2020*  . Colon Cancer Screening  02/25/2023  . Flu Shot  Completed  . DEXA scan (bone density measurement)  Completed  .  Hepatitis C: One time screening is recommended by Center for Disease Control  (CDC) for  adults born from [birth year] through 1965.   Completed  *Topic was postponed. The date shown is not the original due date.    Preventive Care 11 Years and Older, Female Preventive care refers to lifestyle choices and visits with your health care provider that can promote health and wellness. This includes:  A yearly physical exam. This is also called an annual well check.  Regular dental and eye exams.  Immunizations.  Screening for certain conditions.  Healthy lifestyle choices, such as diet and exercise. What can I expect for my preventive care  visit? Physical exam Your health care provider will check:  Height and weight. These may be used to calculate body mass index (BMI), which is a measurement that tells if you are at a healthy weight.  Heart rate and blood pressure.  Your skin for abnormal spots. Counseling Your health care provider may ask you questions about:  Alcohol, tobacco, and drug use.  Emotional well-being.  Home and relationship well-being.  Sexual activity.  Eating habits.  History of falls.  Memory and ability to understand (cognition).  Work and work Statistician.  Pregnancy and menstrual history. What immunizations do I need?  Influenza (flu) vaccine  This is recommended every year. Tetanus, diphtheria, and pertussis (Tdap) vaccine  You may need a Td booster every 10 years. Varicella (chickenpox) vaccine  You may need this vaccine if you have not already been vaccinated. Zoster (shingles) vaccine  You may need this after age 74. Pneumococcal conjugate (PCV13) vaccine  One dose is recommended after age 61. Pneumococcal polysaccharide (PPSV23) vaccine  One dose is recommended after age 64. Measles, mumps, and rubella (MMR) vaccine  You may need at least one dose of MMR if you were born in 1957 or later. You may also need a second dose. Meningococcal conjugate (MenACWY) vaccine  You may need this if you have certain conditions. Hepatitis A vaccine  You may need this if you have certain conditions or if you travel or work in places where you may  be exposed to hepatitis A. Hepatitis B vaccine  You may need this if you have certain conditions or if you travel or work in places where you may be exposed to hepatitis B. Haemophilus influenzae type b (Hib) vaccine  You may need this if you have certain conditions. You may receive vaccines as individual doses or as more than one vaccine together in one shot (combination vaccines). Talk with your health care provider about the risks and  benefits of combination vaccines. What tests do I need? Blood tests  Lipid and cholesterol levels. These may be checked every 5 years, or more frequently depending on your overall health.  Hepatitis C test.  Hepatitis B test. Screening  Lung cancer screening. You may have this screening every year starting at age 29 if you have a 30-pack-year history of smoking and currently smoke or have quit within the past 15 years.  Colorectal cancer screening. All adults should have this screening starting at age 69 and continuing until age 10. Your health care provider may recommend screening at age 106 if you are at increased risk. You will have tests every 1-10 years, depending on your results and the type of screening test.  Diabetes screening. This is done by checking your blood sugar (glucose) after you have not eaten for a while (fasting). You may have this done every 1-3 years.  Mammogram. This may be done every 1-2 years. Talk with your health care provider about how often you should have regular mammograms.  BRCA-related cancer screening. This may be done if you have a family history of breast, ovarian, tubal, or peritoneal cancers. Other tests  Sexually transmitted disease (STD) testing.  Bone density scan. This is done to screen for osteoporosis. You may have this done starting at age 71. Follow these instructions at home: Eating and drinking  Eat a diet that includes fresh fruits and vegetables, whole grains, lean protein, and low-fat dairy products. Limit your intake of foods with high amounts of sugar, saturated fats, and salt.  Take vitamin and mineral supplements as recommended by your health care provider.  Do not drink alcohol if your health care provider tells you not to drink.  If you drink alcohol: ? Limit how much you have to 0-1 drink a day. ? Be aware of how much alcohol is in your drink. In the [place] one drink equals one 12 oz bottle of beer (355 mL), one 5 oz glass  of wine (148 mL), or one 1 oz glass of hard liquor (44 mL). Lifestyle  Take daily care of your teeth and gums.  Stay active. Exercise for at least 30 minutes on 5 or more days each week.  Do not use any products that contain nicotine or tobacco, such as cigarettes, e-cigarettes, and chewing tobacco. If you need help quitting, ask your health care provider.  If you are sexually active, practice safe sex. Use a condom or other form of protection in order to prevent STIs (sexually transmitted infections).  Talk with your health care provider about taking a low-dose aspirin or statin. What's next?  Go to your health care provider once a year for a well check visit.  Ask your health care provider how often you should have your eyes and teeth checked.  Stay up to date on all vaccines. This information is not intended to replace advice given to you by your health care provider. Make sure you discuss any questions you have with your health care provider. Document Revised:  03/17/2018 Document Reviewed: 03/17/2018 Elsevier Patient Education  [place].

## 2019-07-03 ENCOUNTER — Ambulatory Visit (INDEPENDENT_AMBULATORY_CARE_PROVIDER_SITE_OTHER): Payer: PPO | Admitting: Family Medicine

## 2019-07-03 ENCOUNTER — Other Ambulatory Visit: Payer: Self-pay

## 2019-07-03 ENCOUNTER — Encounter: Payer: Self-pay | Admitting: Family Medicine

## 2019-07-03 VITALS — BP 110/68 | HR 94 | Temp 97.2°F | Resp 18 | Ht 59.0 in | Wt 135.0 lb

## 2019-07-03 DIAGNOSIS — E1169 Type 2 diabetes mellitus with other specified complication: Secondary | ICD-10-CM | POA: Diagnosis not present

## 2019-07-03 DIAGNOSIS — T7840XA Allergy, unspecified, initial encounter: Secondary | ICD-10-CM

## 2019-07-03 DIAGNOSIS — E1165 Type 2 diabetes mellitus with hyperglycemia: Secondary | ICD-10-CM | POA: Diagnosis not present

## 2019-07-03 DIAGNOSIS — E785 Hyperlipidemia, unspecified: Secondary | ICD-10-CM

## 2019-07-03 DIAGNOSIS — Z0000 Encounter for general adult medical examination without abnormal findings: Secondary | ICD-10-CM | POA: Diagnosis not present

## 2019-07-03 LAB — LIPID PANEL
Cholesterol: 175 mg/dL (ref 0–200)
HDL: 40.1 mg/dL (ref >39.00)
LDL Cholesterol: 102 mg/dL — ABNORMAL HIGH (ref 0–99)
NonHDL: 134.67
Total CHOL/HDL Ratio: 4
Triglycerides: 164 mg/dL — ABNORMAL HIGH (ref 0.0–149.0)
VLDL: 32.8 mg/dL (ref 0.0–40.0)

## 2019-07-03 LAB — COMPREHENSIVE METABOLIC PANEL
ALT: 18 U/L (ref 0–35)
AST: 14 U/L (ref 0–37)
Albumin: 4 g/dL (ref 3.5–5.2)
Alkaline Phosphatase: 77 U/L (ref 39–117)
BUN: 20 mg/dL (ref 6–23)
CO2: 29 mEq/L (ref 19–32)
Calcium: 9.4 mg/dL (ref 8.4–10.5)
Chloride: 99 mEq/L (ref 96–112)
Creatinine, Ser: 0.64 mg/dL (ref 0.40–1.20)
GFR: 92.01 mL/min (ref >60.00)
Glucose, Bld: 198 mg/dL — ABNORMAL HIGH (ref 70–99)
Potassium: 4.5 mEq/L (ref 3.5–5.1)
Sodium: 137 mEq/L (ref 135–145)
Total Bilirubin: 0.4 mg/dL (ref 0.2–1.2)
Total Protein: 7.4 g/dL (ref 6.0–8.3)

## 2019-07-03 LAB — MICROALBUMIN / CREATININE URINE RATIO
Creatinine,U: 83.6 mg/dL
Microalb Creat Ratio: 1.1 mg/g (ref 0.0–30.0)
Microalb, Ur: 0.9 mg/dL (ref 0.0–1.9)

## 2019-07-03 LAB — HEMOGLOBIN A1C: Hgb A1c MFr Bld: 10.5 % — ABNORMAL HIGH (ref 4.6–6.5)

## 2019-07-03 MED ORDER — AZELASTINE HCL 0.05 % OP SOLN: 1.0000 [drp] | Freq: Two times a day (BID) | OPHTHALMIC | 12 refills | Status: DC

## 2019-07-03 MED ORDER — METFORMIN HCL ER 500 MG PO TB24: ORAL_TABLET | ORAL | 1 refills | Status: DC

## 2019-07-03 NOTE — Progress Notes (Signed)
Subjective:     SUI KASPAREK is a 69 y.o. female and is here for a comprehensive physical exam. The patient reports c/o eye drainage esp L eye --- + allergies  She also needs f/u dm , chol and bp.    HYPERTENSION   Blood pressure range-good per pt   Chest pain- no      Dyspnea- no Lightheadedness- no   Edema- no Other side effects -no   Medication compliance: good Low salt diet- yes    DIABETES    Blood Sugar ranges-were running high so pt restarted her metformin ---  60-100 now   Polyuria- no New Visual problems- no  Hypoglycemic symptoms- no  Other side effects-no Medication compliance - good Last eye exam- due Foot exam- today   HYPERLIPIDEMIA  Medication compliance- good RUQ pain-   Muscle aches- no Other side effects-nono       Social History   Socioeconomic History  . Marital status: Married    Spouse name: Not on file  . Number of children: Not on file  . Years of education: Not on file  . Highest education level: Not on file  Occupational History  . Occupation: [employer] trader: [employer]  Tobacco Use  . Smoking status: Former Smoker    Years: 48.00    Quit date: 02/01/2017    Years since quitting: 2.4  . Smokeless tobacco: Never Used  Substance and Sexual Activity  . Alcohol use: No    Alcohol/week: 0.0 standard drinks  . Drug use: No  . Sexual activity: Yes    Partners: Male  Other Topics Concern  . Not on file  Social History Narrative   Exercise--  3 flts steps a day for 8 hours   Social Determinants of Health   Financial Resource Strain: Low Risk   . Difficulty of Paying Living Expenses: Not hard at all  Food Insecurity: No Food Insecurity  . Worried About Charity fundraiser in the Last Year: Never true  . Ran Out of Food in the Last Year: Never true  Transportation Needs: No Transportation Needs  . Lack of Transportation (Medical): No  . Lack of Transportation (Non-Medical): No  Physical Activity:   . Days of  Exercise per Week:   . Minutes of Exercise per Session:   Stress:   . Feeling of Stress :   Social Connections:   . Frequency of Communication with Friends and Family:   . Frequency of Social Gatherings with Friends and Family:   . Attends Religious Services:   . Active Member of Clubs or Organizations:   . Attends Archivist Meetings:   Marland Kitchen Marital Status:   Intimate Partner Violence:   . Fear of Current or Ex-Partner:   . Emotionally Abused:   Marland Kitchen Physically Abused:   . Sexually Abused:    Health Maintenance  Topic Date Due  . TETANUS/TDAP  01/23/2017  . OPHTHALMOLOGY EXAM  01/28/2017  . URINE MICROALBUMIN  02/08/2018  . PNA vac Low Risk Adult (2 of 2 - PPSV23) 02/20/2019  . HEMOGLOBIN A1C  06/25/2019  . MAMMOGRAM  06/28/2020 (Originally 02/20/2016)  . FOOT EXAM  07/02/2020  . COLONOSCOPY  02/25/2023  . INFLUENZA VACCINE  Completed  . DEXA SCAN  Completed  . Hepatitis C Screening  Completed    The following portions of the patient's history were reviewed and updated as appropriate:  She  has a past medical history of Arthritis,  Diabetes mellitus, Esophagitis, Hiatal hernia, Hx of colonic polyps ssp and adenoma (03/06/2018), OSA on CPAP, Sleep apnea, Thyroid disease, and TMJ (sprain of temporomandibular joint). She does not have any pertinent problems on file. She  has a past surgical history that includes Cholecystectomy; Abdominal hysterectomy; Back surgery; Tonsilectomy, adenoidectomy, bilateral myringotomy and tubes; Thyroid surgery; Back surgery; Esophagogastroduodenoscopy; ERCP; and Colonoscopy. Her family history includes Alcohol abuse in her mother; Breast cancer in her cousin and daughter; Heart disease in her maternal grandfather; Stroke in her maternal grandmother and mother. She  reports that she quit smoking about 2 years ago. She quit after 48.00 years of use. She has never used smokeless tobacco. She reports that she does not drink alcohol or use drugs. She  has a current medication list which includes the following prescription(s): alprazolam, baclofen, blood glucose meter kit and supplies, one touch ultra 2, fluoxetine, fluticasone, hydrochlorothiazide, lantus solostar, insulin pen needle, levothyroxine, meloxicam, onetouch ultra, pantoprazole, tramadol, valacyclovir, and metformin, and the following Facility-Administered Medications: sodium chloride and ipratropium-albuterol. Current Outpatient Medications on File Prior to Visit  Medication Sig Dispense Refill  . ALPRAZolam (XANAX) 0.5 MG tablet 1 po tid prn 90 tablet 0  . baclofen (LIORESAL) 10 MG tablet Take 10 mg by mouth 1 day or 1 dose.    . blood glucose meter kit and supplies Dispense based on patient and insurance preference. Use up to four times daily as directed. Dx: E11.9 1 each 0  . Blood Glucose Monitoring Suppl (ONE TOUCH ULTRA 2) w/Device KIT Check blood sugar as directed. Dx:E11.65 1 kit 0  . FLUoxetine (PROZAC) 20 MG capsule TAKE ONE CAPSULE BY MOUTH DAILY 90 capsule 1  . fluticasone (FLONASE) 50 MCG/ACT nasal spray Place 2 sprays into both nostrils daily. 16 g 6  . hydrochlorothiazide (HYDRODIURIL) 25 MG tablet TAKE ONE TABLET BY MOUTH DAILY 90 tablet 0  . Insulin Glargine (LANTUS SOLOSTAR) 100 UNIT/ML Solostar Pen 30 u sq qpm 15 mL 0  . Insulin Pen Needle (NOVOTWIST) 32G X 5 MM MISC Use daily at Bedtime with Lantus 100 each 0  . levothyroxine (SYNTHROID) 112 MCG tablet TAKE ONE TABLET BY MOUTH EVERY MORNING BEFORE BREAKFAST 30 tablet 3  . meloxicam (MOBIC) 15 MG tablet TAKE ONE TABLET BY MOUTH DAILY 90 tablet 0  . ONETOUCH ULTRA test strip USE TO CHECK BLOOD SUGAR FOUR TIMES A DAY AS DIRECTED 400 strip 1  . pantoprazole (PROTONIX) 40 MG tablet Take 1 tablet (40 mg total) by mouth daily. 30 tablet 11  . traMADol (ULTRAM) 50 MG tablet Take 1 tablet (50 mg total) by mouth every 8 (eight) hours as needed for pain. 30 tablet 0  . valACYclovir (VALTREX) 1000 MG tablet Take 1 tablet  (1,000 mg total) by mouth daily as needed. 30 tablet 0   Current Facility-Administered Medications on File Prior to Visit  Medication Dose Route Frequency Provider Last Rate Last Admin  . 0.9 %  sodium chloride infusion  500 mL Intravenous Once Gatha Mayer, MD      . ipratropium-albuterol (DUONEB) 0.5-2.5 (3) MG/3ML nebulizer solution 3 mL  3 mL Nebulization Q6H [name], DO       She is allergic to crestor [rosuvastatin calcium] and hydrocodone-acetaminophen.. Health Maintenance  Topic Date Due  . TETANUS/TDAP  01/23/2017  . OPHTHALMOLOGY EXAM  01/28/2017  . URINE MICROALBUMIN  02/08/2018  . PNA vac Low Risk Adult (2 of 2 - PPSV23) 02/20/2019  . HEMOGLOBIN A1C  06/25/2019  .  MAMMOGRAM  06/28/2020 (Originally 02/20/2016)  . FOOT EXAM  07/02/2020  . COLONOSCOPY  02/25/2023  . INFLUENZA VACCINE  Completed  . DEXA SCAN  Completed  . Hepatitis C Screening  Completed    Review of Systems  Review of Systems  Constitutional: Negative for activity change, appetite change and fatigue.  HENT: Negative for hearing loss, congestion, tinnitus and ear discharge.   Eyes: Negative for visual disturbance (see optho q1y -- vision corrected to 20/20 with glasses).  Respiratory: Negative for cough, chest tightness and shortness of breath.   Cardiovascular: Negative for chest pain, palpitations and leg swelling.  Gastrointestinal: Negative for abdominal pain, diarrhea, constipation and abdominal distention.  Genitourinary: Negative for urgency, frequency, decreased urine volume and difficulty urinating.  Musculoskeletal: Negative for back pain, arthralgias and gait problem.  Skin: Negative for color change, pallor and rash.  Neurological: Negative for dizziness, light-headedness, numbness and headaches.  Hematological: Negative for adenopathy. Does not bruise/bleed easily.  Psychiatric/Behavioral: Negative for suicidal ideas, confusion, sleep disturbance, self-injury, dysphoric mood,  decreased concentration and agitation.  Pt is able to read and write and can do all ADLs No risk for falling No abuse/ violence in home     Objective:    BP 110/68 (BP Location: Right Arm, Patient Position: Sitting, Cuff Size: Normal)   Pulse 94   Temp (!) 97.2 F (36.2 C) (Temporal)   Resp 18   Ht _0  (1.499 m)   Wt 135 lb (61.2 kg)   SpO2 94%   BMI 27.27 kg/m  General appearance: alert, cooperative, appears stated age and no distress Head: Normocephalic, without obvious abnormality, atraumatic Eyes: eyes injected ,  White drainage  L>R Ears: normal TM's and external ear canals both ears Nose: Nares normal. Septum midline. Mucosa normal. No drainage or sinus tenderness. Throat: lips, mucosa, and tongue normal; teeth and gums normal Neck: no adenopathy, no carotid bruit, no JVD, supple, symmetrical, trachea midline and thyroid not enlarged, symmetric, no tenderness/mass/nodules Back: symmetric, no curvature. ROM normal. No CVA tenderness. Lungs: clear to auscultation bilaterally Breasts: gyn Heart: regular rate and rhythm, S1, S2 normal, no murmur, click, rub or gallop Abdomen: soft, non-tender; bowel sounds normal; no masses,  no organomegaly Pelvic: deferred -gyn Extremities: extremities normal, atraumatic, no cyanosis or edema Pulses: 2+ and symmetric Skin: Skin color, texture, turgor normal. No rashes or lesions Lymph nodes: Cervical, supraclavicular, and axillary nodes normal. Neurologic: Alert and oriented X 3, normal strength and tone. Normal symmetric reflexes. Normal coordination and gait    Diabetic Foot Exam - Simple   Simple Foot Form Diabetic Foot exam was performed with the following findings: Yes 07/03/2019  8:48 AM  Visual Inspection No deformities, no ulcerations, no other skin breakdown bilaterally: Yes Sensation Testing Intact to touch and monofilament testing bilaterally: Yes Pulse Check Posterior Tibialis and Dorsalis pulse intact bilaterally:  Yes Comments     Assessment:    Healthy female exam.      Plan:   ghm utd Check labs   See After Visit Summary for Counseling Recommendations    1. Uncontrolled type 2 diabetes mellitus with hyperglycemia ([place]) hgba1c to be checked , minimize simple carbs. Increase exercise as tolerated. Continue current meds  - Microalbumin / creatinine urine ratio - Lipid panel - Hemoglobin A1c - Comprehensive metabolic panel - metFORMIN (GLUCOPHAGE-XR) 500 MG 24 hr tablet; 2 po qd  Dispense: 180 tablet; Refill: 1  2. Hyperlipidemia associated with type 2 diabetes mellitus ([place]) Tolerating statin, encouraged heart  healthy diet, avoid trans fats, minimize simple carbs and saturated fats. Increase exercise as tolerated - Lipid panel - Hemoglobin A1c - Comprehensive metabolic panel  3. Preventative health care See above   4. Allergy, initial encounter See opth if no improvement  - azelastine (OPTIVAR) 0.05 % ophthalmic solution; Place 1 drop into both eyes 2 (two) times daily.  Dispense: 6 mL; Refill: 12

## 2019-07-03 NOTE — Patient Instructions (Signed)
Preventive Care 69 Years and Older, Female Preventive care refers to lifestyle choices and visits with your health care provider that can promote health and wellness. This includes:  A yearly physical exam. This is also called an annual well check.  Regular dental and eye exams.  Immunizations.  Screening for certain conditions.  Healthy lifestyle choices, such as diet and exercise. What can I expect for my preventive care visit? Physical exam Your health care provider will check:  Height and weight. These may be used to calculate body mass index (BMI), which is a measurement that tells if you are at a healthy weight.  Heart rate and blood pressure.  Your skin for abnormal spots. Counseling Your health care provider may ask you questions about:  Alcohol, tobacco, and drug use.  Emotional well-being.  Home and relationship well-being.  Sexual activity.  Eating habits.  History of falls.  Memory and ability to understand (cognition).  Work and work Statistician.  Pregnancy and menstrual history. What immunizations do I need?  Influenza (flu) vaccine  This is recommended every year. Tetanus, diphtheria, and pertussis (Tdap) vaccine  You may need a Td booster every 10 years. Varicella (chickenpox) vaccine  You may need this vaccine if you have not already been vaccinated. Zoster (shingles) vaccine  You may need this after age 69. Pneumococcal conjugate (PCV13) vaccine  One dose is recommended after age 69. Pneumococcal polysaccharide (PPSV23) vaccine  One dose is recommended after age 69. Measles, mumps, and rubella (MMR) vaccine  You may need at least one dose of MMR if you were born in 1957 or later. You may also need a second dose. Meningococcal conjugate (MenACWY) vaccine  You may need this if you have certain conditions. Hepatitis A vaccine  You may need this if you have certain conditions or if you travel or work in places where you may be exposed  to hepatitis A. Hepatitis B vaccine  You may need this if you have certain conditions or if you travel or work in places where you may be exposed to hepatitis B. Haemophilus influenzae type b (Hib) vaccine  You may need this if you have certain conditions. You may receive vaccines as individual doses or as more than one vaccine together in one shot (combination vaccines). Talk with your health care provider about the risks and benefits of combination vaccines. What tests do I need? Blood tests  Lipid and cholesterol levels. These may be checked every 5 years, or more frequently depending on your overall health.  Hepatitis C test.  Hepatitis B test. Screening  Lung cancer screening. You may have this screening every year starting at age 69 if you have a 30-pack-year history of smoking and currently smoke or have quit within the past 15 years.  Colorectal cancer screening. All adults should have this screening starting at age 69 and continuing until age 69. Your health care provider may recommend screening at age 62 if you are at increased risk. You will have tests every 1-10 years, depending on your results and the type of screening test.  Diabetes screening. This is done by checking your blood sugar (glucose) after you have not eaten for a while (fasting). You may have this done every 1-3 years.  Mammogram. This may be done every 1-2 years. Talk with your health care provider about how often you should have regular mammograms.  BRCA-related cancer screening. This may be done if you have a family history of breast, ovarian, tubal, or peritoneal cancers.  Other tests  Sexually transmitted disease (STD) testing.  Bone density scan. This is done to screen for osteoporosis. You may have this done starting at age 69. Follow these instructions at home: Eating and drinking  Eat a diet that includes fresh fruits and vegetables, whole grains, lean protein, and low-fat dairy products. Limit  your intake of foods with high amounts of sugar, saturated fats, and salt.  Take vitamin and mineral supplements as recommended by your health care provider.  Do not drink alcohol if your health care provider tells you not to drink.  If you drink alcohol: ? Limit how much you have to 0-1 drink a day. ? Be aware of how much alcohol is in your drink. In the [place] one drink equals one 12 oz bottle of beer (355 mL), one 5 oz glass of wine (148 mL), or one 1 oz glass of hard liquor (44 mL). Lifestyle  Take daily care of your teeth and gums.  Stay active. Exercise for at least 30 minutes on 5 or more days each week.  Do not use any products that contain nicotine or tobacco, such as cigarettes, e-cigarettes, and chewing tobacco. If you need help quitting, ask your health care provider.  If you are sexually active, practice safe sex. Use a condom or other form of protection in order to prevent STIs (sexually transmitted infections).  Talk with your health care provider about taking a low-dose aspirin or statin. What's next?  Go to your health care provider once a year for a well check visit.  Ask your health care provider how often you should have your eyes and teeth checked.  Stay up to date on all vaccines. This information is not intended to replace advice given to you by your health care provider. Make sure you discuss any questions you have with your health care provider. Document Revised: 03/17/2018 Document Reviewed: 03/17/2018 Elsevier Patient Education  2020 [employer].

## 2019-07-24 ENCOUNTER — Other Ambulatory Visit: Payer: Self-pay | Admitting: Family Medicine

## 2019-07-24 DIAGNOSIS — F419 Anxiety disorder, unspecified: Secondary | ICD-10-CM

## 2019-07-25 NOTE — Telephone Encounter (Signed)
Requesting: xanax Contract: N/A UDS:12/27/17 Last Visit:07/03/19 Next Visit:N/A Last Refill:02/28/19  Please Advise

## 2019-08-17 ENCOUNTER — Ambulatory Visit: Payer: PPO | Admitting: Internal Medicine

## 2019-08-17 ENCOUNTER — Encounter: Payer: Self-pay | Admitting: Internal Medicine

## 2019-08-17 VITALS — BP 96/58 | HR 89 | Temp 98.7°F | Ht 59.0 in | Wt 135.6 lb

## 2019-08-17 DIAGNOSIS — K5732 Diverticulitis of large intestine without perforation or abscess without bleeding: Secondary | ICD-10-CM

## 2019-08-17 DIAGNOSIS — E739 Lactose intolerance, unspecified: Secondary | ICD-10-CM

## 2019-08-17 MED ORDER — AMOXICILLIN-POT CLAVULANATE 875-125 MG PO TABS: 1.0000 | ORAL_TABLET | Freq: Two times a day (BID) | ORAL | 0 refills | Status: DC

## 2019-08-17 NOTE — Progress Notes (Signed)
[name] 69 y.o. [birth year] [id number]  Assessment & Plan:   Encounter Diagnoses  Name Primary?  . Diverticulitis of colon without hemorrhage Yes  . Lactose intolerance - suspected    It sounds like she has recurrent diverticulitis.  We will treat with generic Augmentin x10 days if that fails to relieve her problems she is to let me know.  Lactose intolerance handout on diet information provided to the patient.  I appreciate the opportunity to care for this patient. CC: [name], DO  Dr. [name] Subjective:   Chief Complaint:  HPI The patient is a 69 year old married white woman with a history of CT proven diverticulitis, adenomatous and sessile serrated colon polyps on colonoscopy 2019, diabetes mellitus, sleep apnea, hypothyroidism here with complaints of 69-monthhistory of intermittent lower abdominal cramps left greater than right without diarrhea.  Bowel habits are a little irregular at times.  She will have diarrhea if she drinks milk products however and she also quit eating peanuts when this started.  She has a history of diverticulitis as noted most recently on a CT scan in 2018 with sigmoid diverticulitis.  She thinks this pain is compatible with that problem.  We reviewed her previous CT scan results.  She is due to see gynecology, Dr. MFreda Munroon a routine checkup as well soon.  There is no postmenopausal vaginal bleeding.  She has not had fevers or chills.  This pain may disturb sleep at times. Allergies  Allergen Reactions  . Crestor [Rosuvastatin Calcium] Other (See Comments)    Leg cramps  . Hydrocodone-Acetaminophen Other (See Comments)    Nightmares, skin crawling   Current Meds  Medication Sig  . ALPRAZolam (XANAX) 0.5 MG tablet TAKE ONE TABLET BY MOUTH THREE TIMES A DAY AS NEEDED  . azelastine (OPTIVAR) 0.05 % ophthalmic solution Place 1 drop into both eyes 2 (two) times daily.  . baclofen (LIORESAL) 10 MG tablet Take 10 mg by  mouth 1 day or 1 dose.  . blood glucose meter kit and supplies Dispense based on patient and insurance preference. Use up to four times daily as directed. Dx: E11.9  . Blood Glucose Monitoring Suppl (ONE TOUCH ULTRA 2) w/Device KIT Check blood sugar as directed. Dx:E11.65  . FLUoxetine (PROZAC) 20 MG capsule TAKE ONE CAPSULE BY MOUTH DAILY  . fluticasone (FLONASE) 50 MCG/ACT nasal spray Place 2 sprays into both nostrils daily.  . hydrochlorothiazide (HYDRODIURIL) 25 MG tablet TAKE ONE TABLET BY MOUTH DAILY  . Insulin Glargine (LANTUS SOLOSTAR) 100 UNIT/ML Solostar Pen 30 u sq qpm  . Insulin Pen Needle (NOVOTWIST) 32G X 5 MM MISC Use daily at Bedtime with Lantus  . levothyroxine (SYNTHROID) 112 MCG tablet TAKE ONE TABLET BY MOUTH EVERY MORNING BEFORE BREAKFAST  . meloxicam (MOBIC) 15 MG tablet TAKE ONE TABLET BY MOUTH DAILY  . metFORMIN (GLUCOPHAGE-XR) 500 MG 24 hr tablet 2 po qd  . ONETOUCH ULTRA test strip USE TO CHECK BLOOD SUGAR FOUR TIMES A DAY AS DIRECTED  . pantoprazole (PROTONIX) 40 MG tablet Take 1 tablet (40 mg total) by mouth daily.  . traMADol (ULTRAM) 50 MG tablet Take 1 tablet (50 mg total) by mouth every 8 (eight) hours as needed for pain.  . valACYclovir (VALTREX) 1000 MG tablet Take 1 tablet (1,000 mg total) by mouth daily as needed.   Current Facility-Administered Medications for the 08/17/19 encounter (Office Visit) with GGatha Mayer MD  Medication  . 0.9 %  sodium  chloride infusion  . ipratropium-albuterol (DUONEB) 0.5-2.5 (3) MG/3ML nebulizer solution 3 mL   Past Medical History:  Diagnosis Date  . Arthritis   . Diabetes mellitus   . Esophagitis   . Hiatal hernia   . Hx of colonic polyps ssp and adenoma 03/06/2018  . OSA on CPAP   . Sleep apnea   . Thyroid disease   . TMJ (sprain of temporomandibular joint)    Past Surgical History:  Procedure Laterality Date  . ABDOMINAL HYSTERECTOMY    . BACK SURGERY     x3  . BACK SURGERY     x 5 , 2 neck surgeries, 2  lower back surgeries  . CHOLECYSTECTOMY    . COLONOSCOPY    . ERCP    . ESOPHAGOGASTRODUODENOSCOPY    . THYROID SURGERY    . TONSILECTOMY, ADENOIDECTOMY, BILATERAL MYRINGOTOMY AND TUBES     Social History   Social History Narrative   Exercise--  3 flts steps a day for 8 hours   family history includes Alcohol abuse in her mother; Breast cancer in her cousin and daughter; Heart disease in her maternal grandfather; Stroke in her maternal grandmother and mother.   Review of Systems As above  Objective:   Physical Exam BP (!) 96/58   Pulse 89   Temp 98.7 F (37.1 C)   Ht '4\' 11"'$  (1.499 m)   Wt 135 lb 9.6 oz (61.5 kg)   SpO2 96%   BMI 27.39 kg/m  Mild -mod LLQ tend no rebound

## 2019-08-17 NOTE — Patient Instructions (Signed)
Take the amoxicillin-clavulanate as prescribed. If that does not solve the pain let me know.   As far as the diarrhea - sounds like you may be lactose intolerant. Read the handout and follow the instructions.  I appreciate the opportunity to care for you. Gatha Mayer, MD, [facility]

## 2019-08-21 DIAGNOSIS — Z1231 Encounter for screening mammogram for malignant neoplasm of breast: Secondary | ICD-10-CM | POA: Diagnosis not present

## 2019-08-21 DIAGNOSIS — Z01419 Encounter for gynecological examination (general) (routine) without abnormal findings: Secondary | ICD-10-CM | POA: Diagnosis not present

## 2019-08-25 ENCOUNTER — Other Ambulatory Visit: Payer: Self-pay | Admitting: Family Medicine

## 2019-08-25 DIAGNOSIS — M1990 Unspecified osteoarthritis, unspecified site: Secondary | ICD-10-CM

## 2019-09-11 DIAGNOSIS — Z79899 Other long term (current) drug therapy: Secondary | ICD-10-CM | POA: Diagnosis not present

## 2019-09-11 DIAGNOSIS — M4326 Fusion of spine, lumbar region: Secondary | ICD-10-CM | POA: Diagnosis not present

## 2019-09-11 DIAGNOSIS — G894 Chronic pain syndrome: Secondary | ICD-10-CM | POA: Diagnosis not present

## 2019-09-11 DIAGNOSIS — M960 Pseudarthrosis after fusion or arthrodesis: Secondary | ICD-10-CM | POA: Diagnosis not present

## 2019-09-12 DIAGNOSIS — G894 Chronic pain syndrome: Secondary | ICD-10-CM | POA: Diagnosis not present

## 2019-09-12 DIAGNOSIS — Z79891 Long term (current) use of opiate analgesic: Secondary | ICD-10-CM | POA: Diagnosis not present

## 2019-09-27 ENCOUNTER — Other Ambulatory Visit: Payer: Self-pay | Admitting: Family Medicine

## 2019-09-28 ENCOUNTER — Other Ambulatory Visit: Payer: Self-pay | Admitting: Family Medicine

## 2019-09-28 DIAGNOSIS — F419 Anxiety disorder, unspecified: Secondary | ICD-10-CM

## 2019-09-29 NOTE — Telephone Encounter (Signed)
Alprazolam refill.   Last OV: 07/03/2019 Last Fill: 07/25/2019 #90 and 0RF Pt sig: 1 tab tid prn UDS: 12/27/2017 Low risk

## 2019-10-23 DIAGNOSIS — H52223 Regular astigmatism, bilateral: Secondary | ICD-10-CM | POA: Diagnosis not present

## 2019-10-23 DIAGNOSIS — H1045 Other chronic allergic conjunctivitis: Secondary | ICD-10-CM | POA: Diagnosis not present

## 2019-10-23 DIAGNOSIS — H524 Presbyopia: Secondary | ICD-10-CM | POA: Diagnosis not present

## 2019-10-23 DIAGNOSIS — H5203 Hypermetropia, bilateral: Secondary | ICD-10-CM | POA: Diagnosis not present

## 2019-11-08 DIAGNOSIS — Z6826 Body mass index (BMI) 26.0-26.9, adult: Secondary | ICD-10-CM | POA: Diagnosis not present

## 2019-11-08 DIAGNOSIS — G894 Chronic pain syndrome: Secondary | ICD-10-CM | POA: Diagnosis not present

## 2019-11-08 DIAGNOSIS — M4326 Fusion of spine, lumbar region: Secondary | ICD-10-CM | POA: Diagnosis not present

## 2019-11-08 DIAGNOSIS — M960 Pseudarthrosis after fusion or arthrodesis: Secondary | ICD-10-CM | POA: Diagnosis not present

## 2019-11-15 DIAGNOSIS — G4733 Obstructive sleep apnea (adult) (pediatric): Secondary | ICD-10-CM | POA: Diagnosis not present

## 2019-11-30 ENCOUNTER — Other Ambulatory Visit: Payer: Self-pay | Admitting: Family Medicine

## 2019-12-08 DIAGNOSIS — Z794 Long term (current) use of insulin: Secondary | ICD-10-CM | POA: Diagnosis not present

## 2019-12-08 DIAGNOSIS — E1165 Type 2 diabetes mellitus with hyperglycemia: Secondary | ICD-10-CM | POA: Diagnosis not present

## 2019-12-08 DIAGNOSIS — E039 Hypothyroidism, unspecified: Secondary | ICD-10-CM | POA: Diagnosis not present

## 2019-12-19 ENCOUNTER — Other Ambulatory Visit: Payer: Self-pay | Admitting: Family Medicine

## 2019-12-19 DIAGNOSIS — F419 Anxiety disorder, unspecified: Secondary | ICD-10-CM

## 2019-12-20 NOTE — Telephone Encounter (Signed)
Requesting: xanax Contract:2019 UDS:12/2017 Last Visit:07/03/19 Next Visit:n/a Last Refill:09/29/19  Please Advise

## 2020-01-04 DIAGNOSIS — H524 Presbyopia: Secondary | ICD-10-CM | POA: Diagnosis not present

## 2020-01-04 DIAGNOSIS — H04123 Dry eye syndrome of bilateral lacrimal glands: Secondary | ICD-10-CM | POA: Diagnosis not present

## 2020-01-04 DIAGNOSIS — I10 Essential (primary) hypertension: Secondary | ICD-10-CM | POA: Diagnosis not present

## 2020-01-04 DIAGNOSIS — E119 Type 2 diabetes mellitus without complications: Secondary | ICD-10-CM | POA: Diagnosis not present

## 2020-01-04 DIAGNOSIS — H43813 Vitreous degeneration, bilateral: Secondary | ICD-10-CM | POA: Diagnosis not present

## 2020-01-04 DIAGNOSIS — H5203 Hypermetropia, bilateral: Secondary | ICD-10-CM | POA: Diagnosis not present

## 2020-01-04 DIAGNOSIS — H52223 Regular astigmatism, bilateral: Secondary | ICD-10-CM | POA: Diagnosis not present

## 2020-01-16 DIAGNOSIS — G4733 Obstructive sleep apnea (adult) (pediatric): Secondary | ICD-10-CM | POA: Diagnosis not present

## 2020-01-17 ENCOUNTER — Telehealth: Payer: Self-pay | Admitting: Family Medicine

## 2020-01-17 DIAGNOSIS — F419 Anxiety disorder, unspecified: Secondary | ICD-10-CM

## 2020-01-17 NOTE — Telephone Encounter (Signed)
PDMP okay, RF sent

## 2020-01-17 NOTE — Telephone Encounter (Signed)
Requesting: alprazolam 0.5mg  Contract: 12/27/2017 UDS: 12/27/2017 Low risk Last Visit: 07/03/2019 Next Visit: None scheduled Last Refill: 12/20/2019 #90 and 0RF Pt sig: 1 tab tid prn  Please Advise

## 2020-01-24 ENCOUNTER — Other Ambulatory Visit: Payer: Self-pay

## 2020-01-24 ENCOUNTER — Ambulatory Visit (INDEPENDENT_AMBULATORY_CARE_PROVIDER_SITE_OTHER): Payer: PPO

## 2020-01-24 DIAGNOSIS — Z23 Encounter for immunization: Secondary | ICD-10-CM

## 2020-01-29 DIAGNOSIS — E039 Hypothyroidism, unspecified: Secondary | ICD-10-CM | POA: Diagnosis not present

## 2020-02-13 ENCOUNTER — Telehealth: Payer: Self-pay | Admitting: Family Medicine

## 2020-02-13 MED ORDER — HYDROCHLOROTHIAZIDE 25 MG PO TABS: 25.0000 mg | ORAL_TABLET | Freq: Every day | ORAL | 1 refills | Status: DC

## 2020-02-13 NOTE — Telephone Encounter (Signed)
Refill sent.

## 2020-02-13 NOTE — Telephone Encounter (Signed)
Medication: hydrochlorothiazide (HYDRODIURIL) 25 MG tablet [[id number]]      Has the patient contacted their pharmacy?  (If no, request that the patient contact the pharmacy for the refill.) (If yes, when and what did the pharmacy advise?)     Preferred Pharmacy (with phone number or street name):  [facility] [place] [place] - [address]  [place], [place] [address]  Phone:  [phone] Fax:  [phone]     Agent: Please be advised that RX refills may take up to 3 business days. We ask that you follow-up with your pharmacy.

## 2020-02-23 DIAGNOSIS — M25562 Pain in left knee: Secondary | ICD-10-CM | POA: Diagnosis not present

## 2020-02-23 DIAGNOSIS — M7501 Adhesive capsulitis of right shoulder: Secondary | ICD-10-CM | POA: Diagnosis not present

## 2020-03-13 DIAGNOSIS — E039 Hypothyroidism, unspecified: Secondary | ICD-10-CM | POA: Diagnosis not present

## 2020-03-13 DIAGNOSIS — Z794 Long term (current) use of insulin: Secondary | ICD-10-CM | POA: Diagnosis not present

## 2020-03-13 DIAGNOSIS — E1165 Type 2 diabetes mellitus with hyperglycemia: Secondary | ICD-10-CM | POA: Diagnosis not present

## 2020-03-18 DIAGNOSIS — Z20822 Contact with and (suspected) exposure to covid-19: Secondary | ICD-10-CM | POA: Diagnosis not present

## 2020-03-19 ENCOUNTER — Telehealth (INDEPENDENT_AMBULATORY_CARE_PROVIDER_SITE_OTHER): Payer: PPO | Admitting: Family Medicine

## 2020-03-19 ENCOUNTER — Other Ambulatory Visit: Payer: Self-pay

## 2020-03-19 DIAGNOSIS — J0140 Acute pansinusitis, unspecified: Secondary | ICD-10-CM

## 2020-03-19 DIAGNOSIS — E1165 Type 2 diabetes mellitus with hyperglycemia: Secondary | ICD-10-CM

## 2020-03-19 MED ORDER — DOXYCYCLINE HYCLATE 100 MG PO TABS: 100.0000 mg | ORAL_TABLET | Freq: Two times a day (BID) | ORAL | 0 refills | Status: DC

## 2020-03-19 MED ORDER — METFORMIN HCL ER 500 MG PO TB24: ORAL_TABLET | ORAL | 3 refills | Status: DC

## 2020-03-19 NOTE — Progress Notes (Signed)
Virtual Visit via Video Note  I connected with [name] on 03/19/20 at 11:20 AM EST by a video enabled telemedicine application and verified that I am speaking with the correct person using two identifiers.  Location/ persons in visit Patient: home with her husband Provider: office   I discussed the limitations of evaluation and management by telemedicine and the availability of in person appointments. The patient expressed understanding and agreed to proceed.  History of Present Illness: Pt is home c/o fever and congestion since Thursday--- now has blood in her pnd   She had a covid test yesterday--- results are pending No body aches  Fever 101     Observations/Objective: There were no vitals filed for this visit. 101 fever  Pt is in nad No sob Assessment and Plan: 1. Acute non-recurrent pansinusitis con't flonase and antihistamine abx per orders  Call with covid results  - doxycycline (VIBRA-TABS) 100 MG tablet; Take 1 tablet (100 mg total) by mouth 2 (two) times daily.  Dispense: 20 tablet; Refill: 0  2. Uncontrolled type 2 diabetes mellitus with hyperglycemia (HCC)   - metFORMIN (GLUCOPHAGE-XR) 500 MG 24 hr tablet; 2 po qd  Dispense: 180 tablet; Refill: 3   Follow Up Instructions:    I discussed the assessment and treatment plan with the patient. The patient was provided an opportunity to ask questions and all were answered. The patient agreed with the plan and demonstrated an understanding of the instructions.   The patient was advised to call back or seek an in-person evaluation if the symptoms worsen or if the condition fails to improve as anticipated.  I provided 25 minutes of non-face-to-face time during this encounter.   [name], DO

## 2020-03-20 ENCOUNTER — Telehealth: Payer: Self-pay | Admitting: Family Medicine

## 2020-03-20 NOTE — Telephone Encounter (Signed)
Patient would like to inform you her covid was negative.

## 2020-03-20 NOTE — Telephone Encounter (Signed)
FYI

## 2020-03-20 NOTE — Telephone Encounter (Signed)
Great!  Hope she is feeling better

## 2020-03-26 ENCOUNTER — Other Ambulatory Visit: Payer: Self-pay | Admitting: Family Medicine

## 2020-04-02 ENCOUNTER — Ambulatory Visit (HOSPITAL_BASED_OUTPATIENT_CLINIC_OR_DEPARTMENT_OTHER)
Admission: RE | Admit: 2020-04-02 | Discharge: 2020-04-02 | Disposition: A | Discharge status: Home / Self Care | Payer: PPO | Source: Ambulatory Visit | Attending: Internal Medicine | Admitting: Internal Medicine

## 2020-04-02 ENCOUNTER — Telehealth (INDEPENDENT_AMBULATORY_CARE_PROVIDER_SITE_OTHER): Payer: PPO | Admitting: Internal Medicine

## 2020-04-02 ENCOUNTER — Encounter: Payer: Self-pay | Admitting: Internal Medicine

## 2020-04-02 ENCOUNTER — Other Ambulatory Visit: Payer: Self-pay

## 2020-04-02 VITALS — BP 111/80 | HR 99 | Ht 59.0 in | Wt 135.0 lb

## 2020-04-02 DIAGNOSIS — J40 Bronchitis, not specified as acute or chronic: Secondary | ICD-10-CM | POA: Diagnosis not present

## 2020-04-02 DIAGNOSIS — R059 Cough, unspecified: Secondary | ICD-10-CM

## 2020-04-02 MED ORDER — ALBUTEROL SULFATE HFA 108 (90 BASE) MCG/ACT IN AERS: 2.0000 | INHALATION_SPRAY | Freq: Four times a day (QID) | RESPIRATORY_TRACT | 2 refills | Status: DC | PRN

## 2020-04-02 MED ORDER — AZELASTINE HCL 0.1 % NA SOLN
2.0000 | Freq: Two times a day (BID) | NASAL | 6 refills | Status: DC
Start: 2020-04-02 — End: 2021-05-23

## 2020-04-02 NOTE — Progress Notes (Signed)
Pre visit review using our clinic review tool, if applicable. No additional management support is needed unless otherwise documented below in the visit note. 

## 2020-04-02 NOTE — Progress Notes (Signed)
Subjective:    Patient ID: [name], female    DOB: May 28, 1950, 69 y.o.   MRN: [id number]  DOS:  04/02/2020 Type of visit - description: Virtual Visit via Video Note  I connected with the above patient  by a video enabled telemedicine application and verified that I am speaking with the correct person using two identifiers.   THIS ENCOUNTER IS A VIRTUAL VISIT DUE TO COVID-19 - PATIENT WAS NOT SEEN IN THE OFFICE. PATIENT HAS CONSENTED TO VIRTUAL VISIT / TELEMEDICINE VISIT   Location of patient: home  Location of provider: office  Persons participating in the virtual visit: patient, provider   I discussed the limitations of evaluation and management by telemedicine and the availability of in person appointments. The patient expressed understanding and agreed to proceed.  Acute: Developed respiratory symptoms, was seen virtually by PCP on 03/19/2020, prescribed doxycycline x10. Subsequently tested negative for Covid on 03/16/2020.  On 03/28/2020 the patient and her husband got tested for Covid, he came back positive, [name] remained  negative but continue with symptoms:  Dry cough, chest congestion but no wheezing per se Sinus continue to be congested. She is also having epigastric burning with food, she takes meloxicam regularly and has been taking ibuprofen as well.  Denies chest pain or difficulty breathing No nausea, vomiting, diarrhea. No myalgias Some headache. O2 sats varies, today 91%.   Review of Systems See above   Past Medical History:  Diagnosis Date  . Arthritis   . Diabetes mellitus   . Esophagitis   . Hiatal hernia   . Hx of colonic polyps ssp and adenoma 03/06/2018  . OSA on CPAP   . Sleep apnea   . Thyroid disease   . TMJ (sprain of temporomandibular joint)     Past Surgical History:  Procedure Laterality Date  . ABDOMINAL HYSTERECTOMY    . BACK SURGERY     x3  . BACK SURGERY     x 5 , 2 neck surgeries, 2 lower back surgeries  .  CHOLECYSTECTOMY    . COLONOSCOPY    . ERCP    . ESOPHAGOGASTRODUODENOSCOPY    . THYROID SURGERY    . TONSILECTOMY, ADENOIDECTOMY, BILATERAL MYRINGOTOMY AND TUBES      Allergies as of 04/02/2020      Reactions   Crestor [rosuvastatin Calcium] Other (See Comments)   Leg cramps   Hydrocodone-acetaminophen Other (See Comments)   Nightmares, skin crawling      Medication List       Accurate as of April 02, 2020  8:26 PM. If you have any questions, ask your nurse or doctor.        STOP taking these medications   doxycycline 100 MG tablet Commonly known as: VIBRA-TABS Stopped by: Kathlene November, MD     TAKE these medications   albuterol 108 (90 Base) MCG/ACT inhaler Commonly known as: VENTOLIN HFA Inhale 2 puffs into the lungs every 6 (six) hours as needed for wheezing or shortness of breath. Started by: Kathlene November, MD   ALPRAZolam 0.5 MG tablet Commonly known as: XANAX TAKE ONE TABLET BY MOUTH THREE TIMES A DAY AS NEEDED   azelastine 0.05 % ophthalmic solution Commonly known as: OPTIVAR Place 1 drop into both eyes 2 (two) times daily.   azelastine 0.1 % nasal spray Commonly known as: ASTELIN Place 2 sprays into both nostrils 2 (two) times daily. Started by: Kathlene November, MD   baclofen 10 MG tablet Commonly known as: LIORESAL  Take 10 mg by mouth 1 day or 1 dose.   blood glucose meter kit and supplies Dispense based on patient and insurance preference. Use up to four times daily as directed. Dx: E11.9   FLUoxetine 20 MG capsule Commonly known as: PROZAC TAKE ONE CAPSULE BY MOUTH DAILY   fluticasone 50 MCG/ACT nasal spray Commonly known as: FLONASE Place 2 sprays into both nostrils daily.   hydrochlorothiazide 25 MG tablet Commonly known as: HYDRODIURIL Take 1 tablet (25 mg total) by mouth daily.   Insulin Pen Needle 32G X 5 MM Misc Commonly known as: NovoTwist Use daily at Bedtime with Lantus   Lantus SoloStar 100 UNIT/ML Solostar Pen Generic drug: insulin  glargine 30 u sq qpm   levothyroxine 112 MCG tablet Commonly known as: SYNTHROID Take 1 tablet (112 mcg total) by mouth daily before breakfast.   meloxicam 15 MG tablet Commonly known as: MOBIC Take 1 tablet (15 mg total) by mouth daily.   metFORMIN 500 MG 24 hr tablet Commonly known as: GLUCOPHAGE-XR 2 po qd   ONE TOUCH ULTRA 2 w/Device Kit Check blood sugar as directed. Dx:E11.65   OneTouch Verio Flex System w/Device Kit USE AS DIRECTED TO CHECK BLOOD SUGAR FOUR TIMES A DAY   OneTouch Ultra test strip Generic drug: glucose blood USE TO CHECK BLOOD SUGAR FOUR TIMES A DAY AS DIRECTED   pantoprazole 40 MG tablet Commonly known as: Protonix Take 1 tablet (40 mg total) by mouth daily.   traMADol 50 MG tablet Commonly known as: ULTRAM Take 1 tablet (50 mg total) by mouth every 8 (eight) hours as needed for pain.   valACYclovir 1000 MG tablet Commonly known as: VALTREX TAKE ONE TABLET BY MOUTH DAILY AS NEEDED          Objective:   Physical Exam BP 111/80   Pulse 99   Ht $R'4\' 11"'ee$  (1.499 m)   Wt 135 lb (61.2 kg)   SpO2 91%   BMI 27.27 kg/m  This is a virtual video visit, alert oriented x3, in no distress, speaking in complete sentences, mild cough noted with large airway congestion.    Assessment     69 year old female, PMH includes diabetes on insulin, hypothyroidism, DJD, back surgeries , OSA on Cpap presents with:  Bronchitis: Respiratory symptoms started early this month, has been tested negative for Covid twice (last 03/28/2020) however her husband did test positive for Covid recently.  He is in quarantine. The patient's sxs are cough, sinus congestion, I did notice large airway congestion when she coughs. Denies a history of asthma or COPD however I noticed that at some point she was prescribed DuoNeb. Plan: Chest x-ray to rule out a consolidation. Albuterol, Astelin, Mucinex DM or similar.  No more antibiotics seem appropriate at this point. Also recommend  to hold NSAIDs and take Tylenol since she is having epigastric burning. Monitor closely O2 sats, if they are consistently less than 90% needs to seek medical attention. Get retested for Covid. See detailed message    I discussed the assessment and treatment plan with the patient. The patient was provided an opportunity to ask questions and all were answered. The patient agreed with the plan and demonstrated an understanding of the instructions.   The patient was advised to call back or seek an in-person evaluation if the symptoms worsen or if the condition fails to improve as anticipated.

## 2020-04-08 ENCOUNTER — Other Ambulatory Visit: Payer: Self-pay | Admitting: Family Medicine

## 2020-04-08 DIAGNOSIS — F419 Anxiety disorder, unspecified: Secondary | ICD-10-CM

## 2020-04-08 MED ORDER — ALPRAZOLAM 0.5 MG PO TABS: 0.5000 mg | ORAL_TABLET | Freq: Three times a day (TID) | ORAL | 0 refills | Status: DC | PRN

## 2020-04-08 NOTE — Telephone Encounter (Signed)
Requesting: Xanax Contract: 12/27/2017 UDS:  12/27/2017 Last OV: 04/02/2020, w/ Paz Next OV: N/A Last Refill: 01/17/2020, #90--0 RF Database:   Please advise

## 2020-04-08 NOTE — Telephone Encounter (Signed)
Medication: ALPRAZolam (XANAX) 0.5 MG tablet [[id number]]      Has the patient contacted their pharmacy? no (If no, request that the patient contact the pharmacy for the refill.) (If yes, when and what did the pharmacy advise?)    Preferred Pharmacy (with phone number or street name):  [facility] [place], [place] [address]  [place], [place] [address]  Phone:  [phone] Fax:  [phone]   Agent: Please be advised that RX refills may take up to 3 business days. We ask that you follow-up with your pharmacy.

## 2020-04-10 ENCOUNTER — Telehealth: Payer: Self-pay | Admitting: Family Medicine

## 2020-04-10 ENCOUNTER — Encounter: Payer: Self-pay | Admitting: Family Medicine

## 2020-04-10 NOTE — Telephone Encounter (Signed)
Patient states her covid test was positive. She would like a phone call back.She has some question in regards to the [place].

## 2020-04-10 NOTE — Telephone Encounter (Signed)
Spoke w/ Pt- first she wanted to make Dr. Etter Sjogren aware that her husband Vicente Serene is in hospital since 12/28 w/ COVID. She has tested positive as well but is feeling okay. She wanted to know if she needed an abx- informed that abx wouldn't help for covid d/t it being a viral illness. She had several questions about her J&J vaccine. I recommended once she is better to get a booster. She thinks she will go w/ a [employer] vaccine this time. She thanked me for calling.

## 2020-04-11 NOTE — Telephone Encounter (Signed)
I am aware that her husband is in the hospital.   I have been following him in the chart I am glad that she is not too sick--- if she develops sob or a lot of congestion we can do a virtual visit to see if she needs any medication  She is able to get a pfizer booster if that is want she wants----- let her know the schedule down stairs in case she would like to get it here after she is better --- she still needs to wait the 45 days for booster

## 2020-04-26 ENCOUNTER — Other Ambulatory Visit: Payer: Self-pay | Admitting: Family Medicine

## 2020-04-26 MED ORDER — TRAMADOL HCL 50 MG PO TABS: 50.0000 mg | ORAL_TABLET | Freq: Three times a day (TID) | ORAL | 0 refills | Status: DC | PRN

## 2020-04-26 NOTE — Telephone Encounter (Signed)
Medication: traMADol (ULTRAM) 50 MG tablet [[id number]]       Has the patient contacted their pharmacy? (If no, request that the patient contact the pharmacy for the refill.) (If yes, when and what did the pharmacy advise?)     Preferred Pharmacy (with phone number or street name): [facility] at [place], [place] - [address]  [place], [place] [address]  Phone:  [phone] Fax:  [phone]      Agent: Please be advised that RX refills may take up to 3 business days. We ask that you follow-up with your pharmacy.

## 2020-04-26 NOTE — Telephone Encounter (Signed)
Requesting: Tramadol Contract: 12/27/2017 UDS: 12/27/2017 Last OV: 04/02/2020 w/ Paz Next OV: N/A Last Refill: 12/09/2012, #30--0 RF Database:   Please advise

## 2020-05-17 DIAGNOSIS — G4733 Obstructive sleep apnea (adult) (pediatric): Secondary | ICD-10-CM | POA: Diagnosis not present

## 2020-06-06 ENCOUNTER — Other Ambulatory Visit: Payer: Self-pay

## 2020-06-06 ENCOUNTER — Other Ambulatory Visit: Payer: Self-pay | Admitting: Family Medicine

## 2020-06-06 DIAGNOSIS — F419 Anxiety disorder, unspecified: Secondary | ICD-10-CM

## 2020-06-06 NOTE — Telephone Encounter (Signed)
Requesting: alprazolam 0.5mg  Contract: 12/27/2017 UDS: 12/27/2017 Last Visit:  04/02/2020 w/ Dr. Larose Kells Next Visit: None Last Refill: 04/08/2020 #90 and 0RF  Please Advise

## 2020-07-03 ENCOUNTER — Other Ambulatory Visit: Payer: Self-pay | Admitting: Family Medicine

## 2020-07-03 DIAGNOSIS — M1990 Unspecified osteoarthritis, unspecified site: Secondary | ICD-10-CM

## 2020-07-12 ENCOUNTER — Telehealth (INDEPENDENT_AMBULATORY_CARE_PROVIDER_SITE_OTHER): Payer: PPO | Admitting: Family

## 2020-07-12 ENCOUNTER — Other Ambulatory Visit: Payer: Self-pay

## 2020-07-12 ENCOUNTER — Telehealth: Payer: Self-pay | Admitting: Family Medicine

## 2020-07-12 VITALS — Temp 101.0°F

## 2020-07-12 DIAGNOSIS — J329 Chronic sinusitis, unspecified: Secondary | ICD-10-CM

## 2020-07-12 MED ORDER — AMOXICILLIN-POT CLAVULANATE 875-125 MG PO TABS: 1.0000 | ORAL_TABLET | Freq: Two times a day (BID) | ORAL | 0 refills | Status: DC

## 2020-07-12 MED ORDER — FLUCONAZOLE 150 MG PO TABS: ORAL_TABLET | ORAL | 0 refills | Status: DC

## 2020-07-12 NOTE — Telephone Encounter (Signed)
Pt would like a prescription sent  for  sinus infection

## 2020-07-12 NOTE — Progress Notes (Signed)
Virtual Visit via Telephone Note  I connected with [name] on 07/12/20 at  1:40 PM EDT by telephone and verified that I am speaking with the correct person using two identifiers.  Location: Patient: home Provider: office   I discussed the limitations, risks, security and privacy concerns of performing an evaluation and management service by telephone and the availability of in person appointments. I also discussed with the patient that there may be a patient responsible charge related to this service. The patient expressed understanding and agreed to proceed.   History of Present Illness:  Reports sinus congestion- began Monday 07/08/20.  Reports copious nasal drainage, "kind of clear" in color. She reports + sinus headache as well.  She reports temp 101 this AM, was 99 yesterday.   She denies cough or shortness of breath.  She did a home covid test this AM which was reportedly negative.   She reports that her husband was recently discharged from a 45 day hospitalization for covid. He was discharged home on oxygen. She has had 1 J and J vaccine but has not had a booster.   Observations/Objective:   Gen: Awake, alert, no acute distress ENT: voice sounds nasally from congestion Resp: Breathing sounds even and non-labored- though nose sounds congested Psych: calm/pleasant demeanor Neuro: Alert and Oriented x 3, speech is clear.   Assessment and Plan:  Sinusitis- will rx with augmentin 875mg  bid x 10 days. She states she is prone to yeast infections, so I also sent an rx for diflucan as needed.  Recommended nasal saline spray, mucinex and tylenol prn. She is advised to call if symptoms do not improve in the next few days and to go to the ER over the weekend if symptoms worsen. I also advised her to receive her covid booster once she is feeling better from this sinus infection.     Follow Up Instructions:    I discussed the assessment and treatment plan with the patient. The  patient was provided an opportunity to ask questions and all were answered. The patient agreed with the plan and demonstrated an understanding of the instructions.   The patient was advised to call back or seek an in-person evaluation if the symptoms worsen or if the condition fails to improve as anticipated.  I provided 11 minutes of non-face-to-face time during this encounter.   Nance Pear, NP

## 2020-07-24 ENCOUNTER — Telehealth: Payer: Self-pay

## 2020-07-24 ENCOUNTER — Telehealth: Payer: PPO | Admitting: Family Medicine

## 2020-07-24 NOTE — Telephone Encounter (Signed)
Nurse Assessment Nurse: Eugenio Hoes, RN, Jenny Reichmann Date/Time (Eastern Time): 07/24/2020 12:04:14 PM Confirm and document reason for call. If symptomatic, describe symptoms. ---Caller states that she has been treated for sinus infection with antibiotics a couple of weeks ago. symptoms have now returned and she is having runny nose and cough. Denies a fever. Does the patient have any new or worsening symptoms? ---Yes Will a triage be completed? ---Yes Related visit to physician within the last 2 weeks? ---No Does the PT have any chronic conditions? (i.e. diabetes, asthma, this includes High risk factors for pregnancy, etc.) ---Yes List chronic conditions. ---DM Is this a behavioral health or substance abuse call? ---No Guidelines Guideline Title Affirmed Question Affirmed Notes Nurse Date/Time Eilene Ghazi Time) Sinus Pain or Congestion [1] Sinus congestion (pressure, fullness) AND [2] present > 10 days Lester Oxford, Jenny Reichmann 07/24/2020 12:07:15 PM Disp. Time Eilene Ghazi Time) Disposition Final User PLEASE NOTE: All timestamps contained within this report are represented as [place] Standard Time. CONFIDENTIALTY NOTICE: This fax transmission is intended only for the addressee. It contains information that is legally privileged, confidential or otherwise protected from use or disclosure. If you are not the intended recipient, you are strictly prohibited from reviewing, disclosing, copying using or disseminating any of this information or taking any action in reliance on or regarding this information. If you have received this fax in error, please notify us immediately by telephone so that we can arrange for its return to [place]. Phone: [phone], Toll-Free: [phone], Fax: [phone] Page: 2 of 2 Call Id: [id number] 07/24/2020 12:13:15 PM SEE PCP WITHIN 3 DAYS Yes Eugenio Hoes, RN, Jenny Reichmann Caller Disagree/Comply Comply Caller Understands Yes PreDisposition Home Care Care Advice Given Per Guideline SEE PCP  WITHIN 3 DAYS: * You need to be seen within 2 or 3 days. * PCP VISIT: Call your doctor (or NP/PA) during regular office hours and make an appointment. A clinic or urgent care center are good places to go for care if your doctor's office is closed or you can't get an appointment. NOTE: If office will be open tomorrow, tell caller to call then, not in 3 days. NASAL WASHES FOR A STUFFY NOSE: * Introduction: Saline (salt water) nasal irrigation (nasal wash) is an effective and simple home remedy for treating stuffy nose and sinus congestion. The nose can be irrigated by pouring, spraying, or squirting salt water into the nose and then letting it run back out. * Methods: There are several ways to irrigate the nose. You can use a saline nasal spray bottle (available over-the-counter), a rubber ear syringe, a medical syringe without the needle, or a NETI POT. NASAL DECONGESTANTS FOR A VERY STUFFY NOSE: * MOST PEOPLE DO NOT NEED TO USE THESE MEDICINES. * If your nose feels blocked, you should try using nasal washes first. * If you have a very stuffy nose, nasal decongestant medicines can shrink the swollen nasal mucosa and allow for easier breathing. If you have a very runny nose, these medicines can reduce the amount of drainage. They may be taken as pills by mouth or as a nasal spray. * Pseudoephedrine (Sudafed): Available over-the-counter in pill form. Typical adult dosage is two 30 mg tablets every 6 hours. * Oxymetazoline Nasal Drops (Afrin in [place]; Drixoral in [place]): Available overthe-counter. Clean out the nose before using. Spray each nostril once, wait one minute for absorption, and then spray a second time. * Phenylephrine Nasal Drops (Neo-Synephrine): Available over-the-counter. Clean out the nose before using. Spray each nostril once, wait one minute for  absorption, and then spray a second time. PAIN MEDICINES: * ACETAMINOPHEN - REGULAR STRENGTH TYLENOL: Take 650 mg (two 325 mg pills) by mouth  every 4 to 6 hours as needed. Each Regular Strength Tylenol pill has 325 mg of acetaminophen. The most you should take each day is 3,250 mg (10 pills a day). * ACETAMINOPHEN - EXTRA STRENGTH TYLENOL: Take 1,000 mg (two 500 mg pills) every 8 hours as needed. Each Extra Strength Tylenol pill has 500 mg of acetaminophen. The most you should take each day is 3,000 mg (6 pills a day). * NAPROXEN (E.G., ALEVE): Take 220 mg (one 220 mg pill) by mouth every 8 to 12 hours as needed. You may take 440 mg (two 220 mg pills) for your first dose. The most you should take each day is 660 mg (three 220 mg pills a day), unless your doctor has told you to take more. * IBUPROFEN (E.G., MOTRIN, ADVIL): Take 400 mg (two 200 mg pills) by mouth every 6 hours. The most you should take each day is 1,200 mg (six 200 mg pills), unless your doctor has told you to take more. CALL BACK IF: * Difficulty breathing (and not relieved by cleaning out nose) * You become worse CARE ADVICE given per Sinus Pain or Congestion (Adult) guideline. Referrals REFERRED TO PCP OFFICE  Pt scheduled w/ Dr. Lorelei Pont tomorrow.

## 2020-07-24 NOTE — Progress Notes (Signed)
[facility] [place], [place], [place] [address] [phone]  Date:  07/25/2020   Name:  [name]   DOB:  1950-04-09   MRN:  [id number]  PCP:  [name], DO    Chief Complaint: possible sinus infection (Going on the last week )   History of Present Illness:  [name] is a 70 y.o. very pleasant female patient who presents with the following:  Primary patient of my partner Dr. Etter Sjogren, here today with illness History of insulin-dependent diabetes, sleep apnea, smoking  She called yesterday, was triaged to the emergency department due to hypoxemia but did not wish to go, scheduled for today instead Seen virtually by [facility] April 8-she was treated with Augmentin for presumed sinus infection.  She got better in between that time and current illness She is not fully vaccinated against COVID-19-so far she had 1 dose of [employer] but no booster  She did test positive for COVID-19 back in January   Her husband was recently hospitalized for about 6 weeks with COVID-he is finally back home and recovering  Today is Thursday She started getting sick over the weekend- headache, drainage,cough She has noted a low grade temp to about 99.9- 100.6 No vomiting or diarrhea  She did a covid test yesterday at her pharmacy but this is pending -result actually came in during visit, negative She notes that her oxygen "has never been like this" -she does not typically have hypoxemia Her oxygen level at visit today is improved She did smoke for nearly 50 years-now quit Noted oxygen saturation at her well visit year ago, 94%  Her DM is managed by endocrine DR Buddy Duty   Lab Results  Component Value Date   HGBA1C 10.5 (H) 07/03/2019     Patient Active Problem List   Diagnosis Date Noted  . Anxiety 02/28/2019  . Hx of colonic polyps ssp and adenoma 03/06/2018  . Pink eye disease of left eye 12/27/2017  .  Sinus headache 05/25/2017  . Headache above the eye region 05/25/2017  . Pre-op evaluation 02/08/2017  . Obstructive sleep apnea treated with continuous positive airway pressure (CPAP) 01/28/2017  . Urinary frequency 11/23/2016  . Diabetes mellitus, type II, insulin dependent ([place]) 11/23/2016  . Left lower quadrant pain 11/23/2016  . Chronic pansinusitis 10/12/2016  . Preventative health care 02/07/2016  . Cigarette nicotine dependence with nicotine-induced disorder 07/22/2015  . Hypersomnia with sleep apnea 07/22/2015  . Snoring 07/22/2015  . S/P UPPP (uvulopalatopharyngoplasty) 07/22/2015  . Severe frontal headaches 07/22/2015  . Chronic ethmoidal sinusitis 07/22/2015  . OSA (obstructive sleep apnea) 07/08/2015  . Cervical osteoarthritis 11/23/2014  . Degeneration of intervertebral disc of cervical region 11/23/2014  . Cervical pain 11/23/2014  . OE (otitis externa) 12/09/2012  . Hypothyroidism 06/04/2010  . Diabetes mellitus type II, uncontrolled ([place]) 06/04/2010  . Hyperlipidemia LDL goal <70 06/04/2010  . OSTEOARTHRITIS 06/04/2010    Past Medical History:  Diagnosis Date  . Arthritis   . Diabetes mellitus   . Esophagitis   . Hiatal hernia   . Hx of colonic polyps ssp and adenoma 03/06/2018  . OSA on CPAP   . Sleep apnea   . Thyroid disease   . TMJ (sprain of temporomandibular joint)     Past Surgical History:  Procedure Laterality Date  . ABDOMINAL HYSTERECTOMY    . BACK SURGERY     x3  .  BACK SURGERY     x 5 , 2 neck surgeries, 2 lower back surgeries  . CHOLECYSTECTOMY    . COLONOSCOPY    . ERCP    . ESOPHAGOGASTRODUODENOSCOPY    . THYROID SURGERY    . TONSILECTOMY, ADENOIDECTOMY, BILATERAL MYRINGOTOMY AND TUBES      Social History   Tobacco Use  . Smoking status: Former Smoker    Years: 48.00    Quit date: 02/01/2017    Years since quitting: 3.4  . Smokeless tobacco: Never Used  Vaping Use  . Vaping Use: Never used  Substance Use Topics  .  Alcohol use: No    Alcohol/week: 0.0 standard drinks  . Drug use: No    Family History  Problem Relation Age of Onset  . Stroke Mother   . Alcohol abuse Mother   . Stroke Maternal Grandmother   . Heart disease Maternal Grandfather   . Breast cancer Cousin   . Breast cancer Daughter   . Colon cancer Neg Hx   . Esophageal cancer Neg Hx   . Rectal cancer Neg Hx   . Stomach cancer Neg Hx     Allergies  Allergen Reactions  . Crestor [Rosuvastatin Calcium] Other (See Comments)    Leg cramps  . Hydrocodone-Acetaminophen Other (See Comments)    Nightmares, skin crawling    Medication list has been reviewed and updated.  Current Outpatient Medications on File Prior to Visit  Medication Sig Dispense Refill  . albuterol (VENTOLIN HFA) 108 (90 Base) MCG/ACT inhaler Inhale 2 puffs into the lungs every 6 (six) hours as needed for wheezing or shortness of breath. 8 g 2  . ALPRAZolam (XANAX) 0.5 MG tablet TAKE ONE TABLET BY MOUTH THREE TIMES A DAY AS NEEDED 90 tablet 0  . azelastine (ASTELIN) 0.1 % nasal spray Place 2 sprays into both nostrils 2 (two) times daily. 30 mL 6  . Blood Glucose Monitoring Suppl (ONE TOUCH ULTRA 2) w/Device KIT Check blood sugar as directed. Dx:E11.65 1 kit 0  . Blood Glucose Monitoring Suppl (ONETOUCH VERIO FLEX SYSTEM) w/Device KIT USE AS DIRECTED TO CHECK BLOOD SUGAR FOUR TIMES A DAY 1 kit 0  . FLUoxetine (PROZAC) 20 MG capsule TAKE ONE CAPSULE BY MOUTH DAILY 90 capsule 1  . hydrochlorothiazide (HYDRODIURIL) 25 MG tablet Take 1 tablet (25 mg total) by mouth daily. 90 tablet 1  . Insulin Glargine (LANTUS SOLOSTAR) 100 UNIT/ML Solostar Pen 30 u sq qpm 15 mL 0  . Insulin Pen Needle (NOVOTWIST) 32G X 5 MM MISC Use daily at Bedtime with Lantus 100 each 0  . levothyroxine (SYNTHROID) 112 MCG tablet Take 1 tablet (112 mcg total) by mouth daily before breakfast. 90 tablet 0  . meloxicam (MOBIC) 15 MG tablet TAKE ONE TABLET BY MOUTH DAILY 90 tablet 1  . metFORMIN  (GLUCOPHAGE-XR) 500 MG 24 hr tablet 2 po qd 180 tablet 3  . ONETOUCH ULTRA test strip USE TO CHECK BLOOD SUGAR FOUR TIMES A DAY AS DIRECTED 400 strip 1  . pantoprazole (PROTONIX) 40 MG tablet Take 1 tablet (40 mg total) by mouth daily. 30 tablet 11  . traMADol (ULTRAM) 50 MG tablet Take 1 tablet (50 mg total) by mouth every 8 (eight) hours as needed. 30 tablet 0  . valACYclovir (VALTREX) 1000 MG tablet TAKE ONE TABLET BY MOUTH DAILY AS NEEDED 30 tablet 0  . baclofen (LIORESAL) 10 MG tablet Take 10 mg by mouth 1 day or 1 dose. (Patient not taking: Reported  on 07/25/2020)    . fluticasone (FLONASE) 50 MCG/ACT nasal spray Place 2 sprays into both nostrils daily. (Patient not taking: Reported on 07/25/2020) 16 g 6   Current Facility-Administered Medications on File Prior to Visit  Medication Dose Route Frequency Provider Last Rate Last Admin  . 0.9 %  sodium chloride infusion  500 mL Intravenous Once Gatha Mayer, MD      . ipratropium-albuterol (DUONEB) 0.5-2.5 (3) MG/3ML nebulizer solution 3 mL  3 mL Nebulization Q6H Roma Schanz R, DO        Review of Systems:  As per HPI- otherwise negative.   Physical Examination: Vitals:   07/25/20 1453  BP: 102/60  Pulse: 98  Temp: 98 F (36.7 C)  SpO2: 92%   Vitals:   07/25/20 1453  Weight: 132 lb 3.2 oz (60 kg)  Height: _0  (1.499 m)   Body mass index is 26.7 kg/m. Ideal Body Weight: Weight in (lb) to have BMI = 25: 123.5  GEN: no acute distress.  Minimal overweight, otherwise looks well HEENT: Atraumatic, Normocephalic. Bilateral TM wnl, oropharynx normal.  PEERL,EOMI.   Ears and Nose: No external deformity. CV: RRR, No M/G/R. No JVD. No thrill. No extra heart sounds. PULM: CTA B, no wheezes, crackles, rhonchi. No retractions. No resp. distress. No accessory muscle use.  Lungs are benign at this time ABD: S, NT, ND. No rebound. No HSM. EXTR: No c/c/e PSYCH: Normally interactive. Conversant.   Results for orders placed or  performed in visit on 07/25/20  POCT Influenza A/B  Result Value Ref Range   Influenza A, POC Negative Negative   Influenza B, POC Negative Negative   DG Chest 2 View  Result Date: 07/25/2020 CLINICAL DATA:  Cough EXAM: CHEST - 2 VIEW COMPARISON:  06/04/2019 FINDINGS: Interstitial prominence throughout the lungs, increasing since prior study. Heart is normal size. Mild peribronchial thickening. No effusions. No acute bony abnormality. IMPRESSION: Peribronchial thickening with worsening interstitial prominence. This could reflect bronchitic changes or atypical infection. Electronically Signed   By: Rolm Baptise M.D.   On: 07/25/2020 15:41    Assessment and Plan: Cough - Plan: DG Chest 2 View, POCT Influenza A/B, DISCONTINUED: cefdinir (OMNICEF) 300 MG capsule  Fever, unspecified fever cause - Plan: DG Chest 2 View, POCT Influenza A/B, DISCONTINUED: cefdinir (OMNICEF) 300 MG capsule  Bronchitis - Plan: azithromycin (ZITHROMAX) 250 MG tablet  Patient today with concern of illness.  She had COVID-19 earlier this year, had a PCR test yesterday which just came back negative She has had low-grade fevers and cough.  We will obtain a chest x-ray Negative rapid flu today I had planned to use Omnicef, but given chest x-ray results of possible atypical infection we will change to azithromycin She will continue to monitor her condition closely at home, if not improving in the next 2 days she will let me know, sooner if getting worse For the time being we will hold off on prednisone given insulin-dependent diabetes, but can have this later if necessary This visit occurred during the SARS-CoV-2 public health emergency.  Safety protocols were in place, including screening questions prior to the visit, additional usage of staff PPE, and extensive cleaning of exam room while observing appropriate contact time as indicated for disinfecting solutions.    Signed Lamar Blinks, MD

## 2020-07-25 ENCOUNTER — Encounter: Payer: Self-pay | Admitting: Family Medicine

## 2020-07-25 ENCOUNTER — Ambulatory Visit (INDEPENDENT_AMBULATORY_CARE_PROVIDER_SITE_OTHER): Payer: PPO | Admitting: Family Medicine

## 2020-07-25 ENCOUNTER — Other Ambulatory Visit: Payer: Self-pay

## 2020-07-25 ENCOUNTER — Ambulatory Visit (HOSPITAL_BASED_OUTPATIENT_CLINIC_OR_DEPARTMENT_OTHER)
Admission: RE | Admit: 2020-07-25 | Discharge: 2020-07-25 | Disposition: A | Discharge status: Home / Self Care | Payer: PPO | Source: Ambulatory Visit | Attending: Family Medicine | Admitting: Family Medicine

## 2020-07-25 VITALS — BP 102/60 | HR 98 | Temp 98.0°F | Ht 59.0 in | Wt 132.2 lb

## 2020-07-25 DIAGNOSIS — R059 Cough, unspecified: Secondary | ICD-10-CM

## 2020-07-25 DIAGNOSIS — J40 Bronchitis, not specified as acute or chronic: Secondary | ICD-10-CM | POA: Diagnosis not present

## 2020-07-25 DIAGNOSIS — G4733 Obstructive sleep apnea (adult) (pediatric): Secondary | ICD-10-CM | POA: Diagnosis not present

## 2020-07-25 DIAGNOSIS — R509 Fever, unspecified: Secondary | ICD-10-CM | POA: Insufficient documentation

## 2020-07-25 LAB — POCT INFLUENZA A/B
Influenza A, POC: NEGATIVE
Influenza B, POC: NEGATIVE

## 2020-07-25 MED ORDER — AZITHROMYCIN 250 MG PO TABS: ORAL_TABLET | ORAL | 0 refills | Status: AC

## 2020-07-25 MED ORDER — CEFDINIR 300 MG PO CAPS: 300.0000 mg | ORAL_CAPSULE | Freq: Two times a day (BID) | ORAL | 0 refills | Status: DC

## 2020-07-30 ENCOUNTER — Encounter: Payer: Self-pay | Admitting: Family Medicine

## 2020-07-30 ENCOUNTER — Telehealth: Payer: Self-pay

## 2020-07-30 MED ORDER — DOXYCYCLINE HYCLATE 100 MG PO CAPS
100.0000 mg | ORAL_CAPSULE | Freq: Two times a day (BID) | ORAL | 0 refills | Status: DC
Start: 2020-07-30 — End: 2020-11-18

## 2020-07-30 NOTE — Telephone Encounter (Signed)
Patient sent messge to front office staff "have taken all my medi cine but still have a low grade fever and still horse so do i need more meds "  She was seen on 07/25/2020 She was given azithromycin 250 mg.

## 2020-07-30 NOTE — Telephone Encounter (Signed)
Called pt- no answer, LMOM.  Will also send mychart message rx doxy to [facility]

## 2020-07-30 NOTE — Addendum Note (Signed)
Addended by: Lamar Blinks C on: 07/30/2020 03:10 PM   Modules accepted: Orders

## 2020-08-15 ENCOUNTER — Other Ambulatory Visit: Payer: Self-pay | Admitting: Family Medicine

## 2020-08-15 ENCOUNTER — Other Ambulatory Visit: Payer: Self-pay

## 2020-08-15 DIAGNOSIS — F419 Anxiety disorder, unspecified: Secondary | ICD-10-CM

## 2020-08-15 MED ORDER — ALPRAZOLAM 0.5 MG PO TABS: 0.5000 mg | ORAL_TABLET | Freq: Three times a day (TID) | ORAL | 0 refills | Status: DC | PRN

## 2020-08-15 NOTE — Progress Notes (Unsigned)
Requesting: Xanax  Contract: 2019 UDS: 2019  Last OV: 07/25/2020, w/ Copland  Next OV:  N/A  Last Refill: 06/06/2020, #90--0 RF Database:   Please advise

## 2020-08-15 NOTE — Progress Notes (Signed)
Sent in

## 2020-08-27 ENCOUNTER — Other Ambulatory Visit: Payer: Self-pay

## 2020-08-27 ENCOUNTER — Other Ambulatory Visit (INDEPENDENT_AMBULATORY_CARE_PROVIDER_SITE_OTHER): Payer: PPO

## 2020-08-27 ENCOUNTER — Other Ambulatory Visit: Payer: Self-pay | Admitting: Family Medicine

## 2020-08-27 DIAGNOSIS — E039 Hypothyroidism, unspecified: Secondary | ICD-10-CM

## 2020-08-27 DIAGNOSIS — Z1231 Encounter for screening mammogram for malignant neoplasm of breast: Secondary | ICD-10-CM | POA: Diagnosis not present

## 2020-08-27 LAB — HM MAMMOGRAPHY

## 2020-08-28 LAB — THYROID PANEL WITH TSH
Free Thyroxine Index: 2.3 (ref 1.4–3.8)
T3 Uptake: 29 % (ref 22–35)
T4, Total: 7.9 ug/dL (ref 5.1–11.9)
TSH: 14.81 mIU/L — ABNORMAL HIGH (ref 0.40–4.50)

## 2020-09-04 ENCOUNTER — Ambulatory Visit: Payer: PPO | Attending: Internal Medicine

## 2020-09-04 DIAGNOSIS — Z23 Encounter for immunization: Secondary | ICD-10-CM

## 2020-09-04 NOTE — Progress Notes (Signed)
   Covid-19 Vaccination Clinic  Name:  [name]    MRN: [id number] DOB: 1950-06-15  09/04/2020  [name] was observed post Covid-19 immunization for 15 minutes without incident. She was provided with Vaccine Information Sheet and instruction to access the V-Safe system.   [name] was instructed to call 911 with any severe reactions post vaccine: Marland Kitchen Difficulty breathing  . Swelling of face and throat  . A fast heartbeat  . A bad rash all over body  . Dizziness and weakness   Immunizations Administered    Name Date Dose VIS Date Route   Moderna Covid-19 Booster Vaccine 09/04/2020 11:31 AM 0.25 mL 01/24/2020 Intramuscular   Manufacturer: Moderna   Lot: [id number]   [place]: [phone]

## 2020-09-09 ENCOUNTER — Other Ambulatory Visit (HOSPITAL_BASED_OUTPATIENT_CLINIC_OR_DEPARTMENT_OTHER): Payer: Self-pay

## 2020-09-09 MED ORDER — MODERNA COVID-19 VACCINE 100 MCG/0.5ML IM SUSP
INTRAMUSCULAR | 0 refills | Status: DC
  Filled 2020-09-09: qty 0.25, 1d supply, fill #0

## 2020-09-10 ENCOUNTER — Other Ambulatory Visit (HOSPITAL_BASED_OUTPATIENT_CLINIC_OR_DEPARTMENT_OTHER): Payer: Self-pay

## 2020-09-20 ENCOUNTER — Other Ambulatory Visit: Payer: Self-pay | Admitting: Family Medicine

## 2020-09-20 DIAGNOSIS — F419 Anxiety disorder, unspecified: Secondary | ICD-10-CM

## 2020-09-20 NOTE — Telephone Encounter (Signed)
Requesting: alprazolam 0.5mg  Contract: 12/27/2017 UDS: 12/27/2017 Last Visit: 07/25/2020 w/ Copland Next Visit: None Last Refill: 08/15/2020 #90 and 0RF  Please Advise

## 2020-10-03 ENCOUNTER — Telehealth: Payer: Self-pay | Admitting: Family Medicine

## 2020-10-03 NOTE — Telephone Encounter (Signed)
Patient called back and was advised of reason for phone call. "She will call back to schedule appointment for wellness visit when she returns from vacation".

## 2020-10-03 NOTE — Telephone Encounter (Signed)
Copied from [place] [phone]. Topic: Medicare AWV >> Oct 03, 2020 11:17 AM Harris-Coley, Hannah Beat wrote: Reason for CRM: Left message for patient to schedule Annual Wellness Visit.  Please schedule with Health Nurse Advisor Augustine Radar. at [facility].

## 2020-10-16 DIAGNOSIS — M4326 Fusion of spine, lumbar region: Secondary | ICD-10-CM | POA: Diagnosis not present

## 2020-10-16 DIAGNOSIS — M5416 Radiculopathy, lumbar region: Secondary | ICD-10-CM | POA: Diagnosis not present

## 2020-10-16 DIAGNOSIS — M545 Low back pain, unspecified: Secondary | ICD-10-CM | POA: Diagnosis not present

## 2020-10-16 DIAGNOSIS — M5136 Other intervertebral disc degeneration, lumbar region: Secondary | ICD-10-CM | POA: Diagnosis not present

## 2020-10-18 DIAGNOSIS — E039 Hypothyroidism, unspecified: Secondary | ICD-10-CM | POA: Diagnosis not present

## 2020-11-01 ENCOUNTER — Telehealth: Payer: Self-pay | Admitting: Family Medicine

## 2020-11-01 ENCOUNTER — Other Ambulatory Visit: Payer: Self-pay

## 2020-11-01 MED ORDER — ONETOUCH ULTRA VI STRP: ORAL_STRIP | 1 refills | Status: DC

## 2020-11-01 NOTE — Telephone Encounter (Signed)
Patient husband seen lowne today and stated the pharm had sent something over for strips to be refill  Can we refill that for them?   ONETOUCH ULTRA test strip [id number]

## 2020-11-04 NOTE — Telephone Encounter (Signed)
Looks like test strips were sent in on 11/01/20

## 2020-11-18 ENCOUNTER — Other Ambulatory Visit: Payer: Self-pay

## 2020-11-18 ENCOUNTER — Ambulatory Visit (INDEPENDENT_AMBULATORY_CARE_PROVIDER_SITE_OTHER): Payer: PPO | Admitting: Family Medicine

## 2020-11-18 ENCOUNTER — Encounter: Payer: Self-pay | Admitting: Family Medicine

## 2020-11-18 VITALS — BP 114/80 | HR 95 | Temp 98.1°F | Resp 18 | Ht 59.0 in | Wt 128.6 lb

## 2020-11-18 DIAGNOSIS — R1032 Left lower quadrant pain: Secondary | ICD-10-CM | POA: Diagnosis not present

## 2020-11-18 LAB — CBC WITH DIFFERENTIAL/PLATELET
Basophils Absolute: 0 10*3/uL (ref 0.0–0.1)
Basophils Relative: 0.4 % (ref 0.0–3.0)
Eosinophils Absolute: 0.1 10*3/uL (ref 0.0–0.7)
Eosinophils Relative: 1.2 % (ref 0.0–5.0)
HCT: 39.3 % (ref 36.0–46.0)
Hemoglobin: 13.2 g/dL (ref 12.0–15.0)
Lymphocytes Relative: 27.8 % (ref 12.0–46.0)
Lymphs Abs: 1.7 10*3/uL (ref 0.7–4.0)
MCHC: 33.5 g/dL (ref 30.0–36.0)
MCV: 90 fl (ref 78.0–100.0)
Monocytes Absolute: 0.5 10*3/uL (ref 0.1–1.0)
Monocytes Relative: 8 % (ref 3.0–12.0)
Neutro Abs: 3.9 10*3/uL (ref 1.4–7.7)
Neutrophils Relative %: 62.6 % (ref 43.0–77.0)
Platelets: 199 10*3/uL (ref 150.0–400.0)
RBC: 4.37 Mil/uL (ref 3.87–5.11)
RDW: 14.1 % (ref 11.5–15.5)
WBC: 6.3 10*3/uL (ref 4.0–10.5)

## 2020-11-18 LAB — POC URINALSYSI DIPSTICK (AUTOMATED)
Bilirubin, UA: NEGATIVE
Blood, UA: NEGATIVE
Glucose, UA: POSITIVE — AB
Ketones, UA: NEGATIVE
Leukocytes, UA: NEGATIVE
Nitrite, UA: NEGATIVE
Protein, UA: NEGATIVE
Spec Grav, UA: 1.02 (ref 1.010–1.025)
Urobilinogen, UA: 0.2 E.U./dL
pH, UA: 6 (ref 5.0–8.0)

## 2020-11-18 LAB — COMPREHENSIVE METABOLIC PANEL
ALT: 17 U/L (ref 0–35)
AST: 16 U/L (ref 0–37)
Albumin: 4.1 g/dL (ref 3.5–5.2)
Alkaline Phosphatase: 64 U/L (ref 39–117)
BUN: 17 mg/dL (ref 6–23)
CO2: 30 mEq/L (ref 19–32)
Calcium: 10.1 mg/dL (ref 8.4–10.5)
Chloride: 98 mEq/L (ref 96–112)
Creatinine, Ser: 0.72 mg/dL (ref 0.40–1.20)
GFR: 84.74 mL/min (ref >60.00)
Glucose, Bld: 249 mg/dL — ABNORMAL HIGH (ref 70–99)
Potassium: 4.3 mEq/L (ref 3.5–5.1)
Sodium: 136 mEq/L (ref 135–145)
Total Bilirubin: 0.4 mg/dL (ref 0.2–1.2)
Total Protein: 7.7 g/dL (ref 6.0–8.3)

## 2020-11-18 LAB — TSH: TSH: 0.06 u[IU]/mL — ABNORMAL LOW (ref 0.35–5.50)

## 2020-11-18 MED ORDER — AMOXICILLIN-POT CLAVULANATE 875-125 MG PO TABS
1.0000 | ORAL_TABLET | Freq: Two times a day (BID) | ORAL | 0 refills | Status: DC
Start: 2020-11-18 — End: 2021-02-04

## 2020-11-18 NOTE — Progress Notes (Signed)
Established Patient Office Visit  Subjective:  Patient ID: [name], female    DOB: March 19, 1951  Age: 70 y.o. MRN: [id number]  CC:  Chief Complaint  Patient presents with   Abdominal Pain    Pt states sxs going x1 month, no nausea or vomiting, pt reports diarrhea after eating.    Headache  +  HPI [name] presents for low abd pain ---  everything she eats goes right through her   no vomiting  Occurs 3-4 x a day.   + abd cramping    Past Medical History:  Diagnosis Date   Arthritis    Diabetes mellitus    Esophagitis    Hiatal hernia    Hx of colonic polyps ssp and adenoma 03/06/2018   OSA on CPAP    Sleep apnea    Thyroid disease    TMJ (sprain of temporomandibular joint)     Past Surgical History:  Procedure Laterality Date   ABDOMINAL HYSTERECTOMY     BACK SURGERY     x3   BACK SURGERY     x 5 , 2 neck surgeries, 2 lower back surgeries   CHOLECYSTECTOMY     COLONOSCOPY     ERCP     ESOPHAGOGASTRODUODENOSCOPY     THYROID SURGERY     TONSILECTOMY, ADENOIDECTOMY, BILATERAL MYRINGOTOMY AND TUBES      Family History  Problem Relation Age of Onset   Stroke Mother    Alcohol abuse Mother    Stroke Maternal Grandmother    Heart disease Maternal Grandfather    Breast cancer Cousin    Breast cancer Daughter    Colon cancer Neg Hx    Esophageal cancer Neg Hx    Rectal cancer Neg Hx    Stomach cancer Neg Hx     Social History   Socioeconomic History   Marital status: Married    Spouse name: Not on file   Number of children: Not on file   Years of education: Not on file   Highest education level: Not on file  Occupational History   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    Employer: [employer]  Tobacco Use   Smoking status: Former    Years: 48.00    Types: Cigarettes    Quit date: 02/01/2017    Years since quitting: 3.7   Smokeless tobacco: Never  Vaping Use   Vaping Use: Never used  Substance and Sexual Activity   Alcohol use: No     Alcohol/week: 0.0 standard drinks   Drug use: No   Sexual activity: Yes    Partners: Male  Other Topics Concern   Not on file  Social History Narrative   Exercise--  3 flts steps a day for 8 hours   Social Determinants of Health   Financial Resource Strain: Not on file  Food Insecurity: Not on file  Transportation Needs: Not on file  Physical Activity: Not on file  Stress: Not on file  Social Connections: Not on file  Intimate Partner Violence: Not on file    Outpatient Medications Prior to Visit  Medication Sig Dispense Refill   albuterol (VENTOLIN HFA) 108 (90 Base) MCG/ACT inhaler Inhale 2 puffs into the lungs every 6 (six) hours as needed for wheezing or shortness of breath. 8 g 2   ALPRAZolam (XANAX) 0.5 MG tablet TAKE ONE TABLET BY MOUTH THREE TIMES A DAY AS NEEDED 90 tablet 0   azelastine (ASTELIN) 0.1 % nasal spray Place 2  sprays into both nostrils 2 (two) times daily. 30 mL 6   Blood Glucose Monitoring Suppl (ONE TOUCH ULTRA 2) w/Device KIT Check blood sugar as directed. Dx:E11.65 1 kit 0   Blood Glucose Monitoring Suppl (ONETOUCH VERIO FLEX SYSTEM) w/Device KIT USE AS DIRECTED TO CHECK BLOOD SUGAR FOUR TIMES A DAY 1 kit 0   FLUoxetine (PROZAC) 20 MG capsule TAKE ONE CAPSULE BY MOUTH DAILY 30 capsule 0   glucose blood (ONETOUCH ULTRA) test strip USE TO CHECK BLOOD SUGAR FOUR TIMES A DAY AS DIRECTED 400 strip 1   hydrochlorothiazide (HYDRODIURIL) 25 MG tablet Take 1 tablet (25 mg total) by mouth daily. 90 tablet 1   Insulin Glargine (LANTUS SOLOSTAR) 100 UNIT/ML Solostar Pen 30 u sq qpm 15 mL 0   Insulin Pen Needle (NOVOTWIST) 32G X 5 MM MISC Use daily at Bedtime with Lantus 100 each 0   levothyroxine (SYNTHROID) 112 MCG tablet Take 1 tablet (112 mcg total) by mouth daily before breakfast. 90 tablet 0   meloxicam (MOBIC) 15 MG tablet TAKE ONE TABLET BY MOUTH DAILY 90 tablet 1   metFORMIN (GLUCOPHAGE-XR) 500 MG 24 hr tablet 2 po qd 180 tablet 3   pantoprazole (PROTONIX) 40  MG tablet Take 1 tablet (40 mg total) by mouth daily. 30 tablet 11   traMADol (ULTRAM) 50 MG tablet Take 1 tablet (50 mg total) by mouth every 8 (eight) hours as needed. 30 tablet 0   valACYclovir (VALTREX) 1000 MG tablet TAKE ONE TABLET BY MOUTH DAILY AS NEEDED 30 tablet 0   baclofen (LIORESAL) 10 MG tablet Take 10 mg by mouth 1 day or 1 dose. (Patient not taking: No sig reported)     COVID-19 mRNA vaccine, Moderna, (MODERNA COVID-19 VACCINE) 100 MCG/0.5ML injection Inject into the muscle. (Patient not taking: Reported on 11/18/2020) 0.25 mL 0   doxycycline (VIBRAMYCIN) 100 MG capsule Take 1 capsule (100 mg total) by mouth 2 (two) times daily. (Patient not taking: Reported on 11/18/2020) 20 capsule 0   fluticasone (FLONASE) 50 MCG/ACT nasal spray Place 2 sprays into both nostrils daily. (Patient not taking: No sig reported) 16 g 6   Facility-Administered Medications Prior to Visit  Medication Dose Route Frequency Provider Last Rate Last Admin   0.9 %  sodium chloride infusion  500 mL Intravenous Once Gatha Mayer, MD       ipratropium-albuterol (DUONEB) 0.5-2.5 (3) MG/3ML nebulizer solution 3 mL  3 mL Nebulization Q6H Lowne Chase, Kendrick Fries R, DO        Allergies  Allergen Reactions   Crestor [Rosuvastatin Calcium] Other (See Comments)    Leg cramps   Hydrocodone-Acetaminophen Other (See Comments)    Nightmares, skin crawling    ROS Review of Systems  Constitutional:  Negative for activity change, appetite change, fatigue, fever and unexpected weight change.  HENT:  Negative for congestion.   Respiratory:  Negative for cough and shortness of breath.   Cardiovascular:  Negative for chest pain and palpitations.  Gastrointestinal:  Positive for abdominal pain and diarrhea. Negative for abdominal distention, blood in stool, constipation, nausea, rectal pain and vomiting.  Musculoskeletal:  Negative for back pain.  Skin:  Negative for rash.  Neurological:  Negative for headaches.   Psychiatric/Behavioral:  Negative for behavioral problems and dysphoric mood. The patient is not nervous/anxious.      Objective:    Physical Exam Vitals and nursing note reviewed.  Constitutional:      Appearance: She is well-developed.  HENT:  Head: Normocephalic and atraumatic.  Eyes:     Conjunctiva/sclera: Conjunctivae normal.  Neck:     Thyroid: No thyromegaly.     Vascular: No carotid bruit or JVD.  Cardiovascular:     Rate and Rhythm: Normal rate and regular rhythm.     Heart sounds: Normal heart sounds. No murmur heard. Pulmonary:     Effort: Pulmonary effort is normal. No respiratory distress.     Breath sounds: Normal breath sounds. No wheezing or rales.  Chest:     Chest wall: No tenderness.  Abdominal:     General: Bowel sounds are normal.     Palpations: Abdomen is soft.     Tenderness: There is abdominal tenderness in the left lower quadrant. There is guarding. There is no rebound.  Musculoskeletal:     Cervical back: Normal range of motion and neck supple.  Neurological:     Mental Status: She is alert and oriented to person, place, and time.    BP 114/80 (BP Location: Right Arm, Patient Position: Sitting, Cuff Size: Normal)   Pulse 95   Temp 98.1 F (36.7 C) (Oral)   Resp 18   Ht _0  (1.499 m)   Wt 128 lb 9.6 oz (58.3 kg)   SpO2 96%   BMI 25.97 kg/m  Wt Readings from Last 3 Encounters:  11/18/20 128 lb 9.6 oz (58.3 kg)  07/25/20 132 lb 3.2 oz (60 kg)  04/02/20 135 lb (61.2 kg)     Health Maintenance Due  Topic Date Due   Zoster Vaccines- Shingrix (1 of 2) Never done   MAMMOGRAM  02/20/2016   TETANUS/TDAP  01/23/2017   OPHTHALMOLOGY EXAM  01/28/2017   PNA vac Low Risk Adult (2 of 2 - PPSV23) 02/20/2019   HEMOGLOBIN A1C  01/03/2020   FOOT EXAM  07/02/2020   URINE MICROALBUMIN  07/02/2020   COVID-19 Vaccine (3 - Booster for Janssen series) 10/30/2020   INFLUENZA VACCINE  11/04/2020    There are no preventive care reminders to  display for this patient.  Lab Results  Component Value Date   TSH 14.81 (H) 08/27/2020   Lab Results  Component Value Date   WBC 7.5 05/25/2017   HGB 11.8 (L) 05/25/2017   HCT 36.0 05/25/2017   MCV 92.3 05/25/2017   PLT 298.0 05/25/2017   Lab Results  Component Value Date   NA 137 07/03/2019   K 4.5 07/03/2019   CO2 29 07/03/2019   GLUCOSE 198 (H) 07/03/2019   BUN 20 07/03/2019   CREATININE 0.64 07/03/2019   BILITOT 0.4 07/03/2019   ALKPHOS 77 07/03/2019   AST 14 07/03/2019   ALT 18 07/03/2019   PROT 7.4 07/03/2019   ALBUMIN 4.0 07/03/2019   CALCIUM 9.4 07/03/2019   ANIONGAP 10 10/16/2018   GFR 92.01 07/03/2019   Lab Results  Component Value Date   CHOL 175 07/03/2019   Lab Results  Component Value Date   HDL 40.10 07/03/2019   Lab Results  Component Value Date   LDLCALC 102 (H) 07/03/2019   Lab Results  Component Value Date   TRIG 164.0 (H) 07/03/2019   Lab Results  Component Value Date   CHOLHDL 4 07/03/2019   Lab Results  Component Value Date   HGBA1C 10.5 (H) 07/03/2019      Assessment & Plan:   Problem List Items Addressed This Visit       Unprioritized   LLQ pain - Primary    Check labs and CT  abd If pain worsens go to ER Suspect diverticulitis ---- abx sent to pharmacy      Relevant Medications   amoxicillin-clavulanate (AUGMENTIN) 875-125 MG tablet   Other Relevant Orders   CBC with Differential/Platelet   TSH   POCT Urinalysis Dipstick (Automated) (Completed)   Comprehensive metabolic panel   Other Visit Diagnoses     Left lower quadrant abdominal pain       Relevant Orders   CT Abdomen Pelvis W Contrast       Meds ordered this encounter  Medications   amoxicillin-clavulanate (AUGMENTIN) 875-125 MG tablet    Sig: Take 1 tablet by mouth 2 (two) times daily.    Dispense:  20 tablet    Refill:  0    Follow-up: Return if symptoms worsen or fail to improve.    [name], DO

## 2020-11-18 NOTE — Assessment & Plan Note (Signed)
Check labs and CT abd If pain worsens go to ER Suspect diverticulitis ---- abx sent to pharmacy

## 2020-11-18 NOTE — Patient Instructions (Signed)

## 2020-11-19 ENCOUNTER — Telehealth: Payer: Self-pay

## 2020-11-19 NOTE — Telephone Encounter (Signed)
While on the phone giving the pt her lab results, she mentioned that she was frustrated. She said she wanted her head, ears and throat checked. Asked if this was not done on exam routinely any longer. She said she mentioned having a HA at her last OV, but since she also has abdominal pain only her abdomin was the focus of the visit. She asks if she needs to come back in to have these things evaluated?

## 2020-11-20 DIAGNOSIS — S8002XA Contusion of left knee, initial encounter: Secondary | ICD-10-CM | POA: Diagnosis not present

## 2020-11-20 DIAGNOSIS — S8001XA Contusion of right knee, initial encounter: Secondary | ICD-10-CM | POA: Diagnosis not present

## 2020-11-20 NOTE — Telephone Encounter (Signed)
Patient notified and she stated that she feel a little better now.

## 2020-11-25 ENCOUNTER — Other Ambulatory Visit: Payer: Self-pay

## 2020-11-25 ENCOUNTER — Ambulatory Visit (HOSPITAL_BASED_OUTPATIENT_CLINIC_OR_DEPARTMENT_OTHER)
Admission: RE | Admit: 2020-11-25 | Discharge: 2020-11-25 | Disposition: A | Discharge status: Home / Self Care | Payer: PPO | Source: Ambulatory Visit | Attending: Family Medicine | Admitting: Family Medicine

## 2020-11-25 DIAGNOSIS — K5730 Diverticulosis of large intestine without perforation or abscess without bleeding: Secondary | ICD-10-CM | POA: Diagnosis not present

## 2020-11-25 DIAGNOSIS — K5900 Constipation, unspecified: Secondary | ICD-10-CM | POA: Diagnosis not present

## 2020-11-25 DIAGNOSIS — E1165 Type 2 diabetes mellitus with hyperglycemia: Secondary | ICD-10-CM | POA: Diagnosis not present

## 2020-11-25 DIAGNOSIS — Z7984 Long term (current) use of oral hypoglycemic drugs: Secondary | ICD-10-CM | POA: Diagnosis not present

## 2020-11-25 DIAGNOSIS — R1032 Left lower quadrant pain: Secondary | ICD-10-CM | POA: Diagnosis not present

## 2020-11-25 DIAGNOSIS — I700 Atherosclerosis of aorta: Secondary | ICD-10-CM | POA: Diagnosis not present

## 2020-11-25 MED ORDER — IOHEXOL 300 MG/ML  SOLN
75.0000 mL | Freq: Once | INTRAMUSCULAR | Status: AC | PRN
  Administered 2020-11-25: 75 mL via INTRAVENOUS

## 2020-12-06 ENCOUNTER — Telehealth: Payer: Self-pay | Admitting: *Deleted

## 2020-12-06 NOTE — Chronic Care Management (AMB) (Signed)
  Chronic Care Management   Note  12/06/2020 Name: [name] MRN: [id number] DOB: [birth year]  [name] is a 70 y.o. year old female who is a primary care patient of [name], DO. I reached out to [employer] by phone today in response to a referral sent by Ms. Berenice Bouton A Christy's PCP [name], DO     Ms. Vandermeulen was given information about Chronic Care Management services today including:  CCM service includes personalized support from designated clinical staff supervised by her physician, including individualized plan of care and coordination with other care providers 24/7 contact phone numbers for assistance for urgent and routine care needs. Service will only be billed when office clinical staff spend 20 minutes or more in a month to coordinate care. Only one practitioner may furnish and bill the service in a calendar month. The patient may stop CCM services at any time (effective at the end of the month) by phone call to the office staff. The patient will be responsible for cost sharing (co-pay) of up to 20% of the service fee (after annual deductible is met).  Patient did not agree to enrollment in care management services and does not wish to consider at this time.  Follow up plan: Patient declines further follow up and engagement by the care management team. Appropriate care team members and provider have been notified via electronic communication.   Julian Hy, [place] Management  Direct Dial: [phone]

## 2020-12-16 ENCOUNTER — Other Ambulatory Visit: Payer: Self-pay | Admitting: Family Medicine

## 2020-12-18 ENCOUNTER — Other Ambulatory Visit: Payer: Self-pay | Admitting: *Deleted

## 2020-12-18 DIAGNOSIS — F419 Anxiety disorder, unspecified: Secondary | ICD-10-CM

## 2020-12-18 MED ORDER — ALPRAZOLAM 0.5 MG PO TABS: 0.5000 mg | ORAL_TABLET | Freq: Three times a day (TID) | ORAL | 0 refills | Status: DC | PRN

## 2020-12-18 NOTE — Telephone Encounter (Signed)
Requesting: alprazolam Contract:12/27/17 UDS:12/27/17 Last Visit: 11/18/20  Next Visit:   Last Refill: 09/20/17  Please Advise

## 2020-12-25 DIAGNOSIS — D3132 Benign neoplasm of left choroid: Secondary | ICD-10-CM | POA: Diagnosis not present

## 2020-12-25 DIAGNOSIS — H52223 Regular astigmatism, bilateral: Secondary | ICD-10-CM | POA: Diagnosis not present

## 2020-12-25 DIAGNOSIS — H35033 Hypertensive retinopathy, bilateral: Secondary | ICD-10-CM | POA: Diagnosis not present

## 2020-12-25 DIAGNOSIS — E119 Type 2 diabetes mellitus without complications: Secondary | ICD-10-CM | POA: Diagnosis not present

## 2020-12-25 DIAGNOSIS — H524 Presbyopia: Secondary | ICD-10-CM | POA: Diagnosis not present

## 2020-12-25 DIAGNOSIS — H43813 Vitreous degeneration, bilateral: Secondary | ICD-10-CM | POA: Diagnosis not present

## 2020-12-25 DIAGNOSIS — H5203 Hypermetropia, bilateral: Secondary | ICD-10-CM | POA: Diagnosis not present

## 2020-12-25 DIAGNOSIS — I10 Essential (primary) hypertension: Secondary | ICD-10-CM | POA: Diagnosis not present

## 2020-12-25 DIAGNOSIS — H04123 Dry eye syndrome of bilateral lacrimal glands: Secondary | ICD-10-CM | POA: Diagnosis not present

## 2021-01-10 ENCOUNTER — Ambulatory Visit (INDEPENDENT_AMBULATORY_CARE_PROVIDER_SITE_OTHER): Payer: PPO

## 2021-01-10 ENCOUNTER — Encounter: Payer: Self-pay | Admitting: Family Medicine

## 2021-01-10 ENCOUNTER — Other Ambulatory Visit: Payer: Self-pay

## 2021-01-10 DIAGNOSIS — Z23 Encounter for immunization: Secondary | ICD-10-CM | POA: Diagnosis not present

## 2021-01-24 DIAGNOSIS — E039 Hypothyroidism, unspecified: Secondary | ICD-10-CM | POA: Diagnosis not present

## 2021-02-04 ENCOUNTER — Encounter: Payer: Self-pay | Admitting: Internal Medicine

## 2021-02-04 ENCOUNTER — Ambulatory Visit (INDEPENDENT_AMBULATORY_CARE_PROVIDER_SITE_OTHER): Payer: PPO | Admitting: Internal Medicine

## 2021-02-04 ENCOUNTER — Other Ambulatory Visit: Payer: PPO

## 2021-02-04 ENCOUNTER — Other Ambulatory Visit: Payer: Self-pay | Admitting: Family Medicine

## 2021-02-04 VITALS — BP 112/66 | HR 92 | Ht 59.0 in | Wt 128.4 lb

## 2021-02-04 DIAGNOSIS — R1319 Other dysphagia: Secondary | ICD-10-CM | POA: Diagnosis not present

## 2021-02-04 DIAGNOSIS — R197 Diarrhea, unspecified: Secondary | ICD-10-CM

## 2021-02-04 DIAGNOSIS — R933 Abnormal findings on diagnostic imaging of other parts of digestive tract: Secondary | ICD-10-CM

## 2021-02-04 DIAGNOSIS — R159 Full incontinence of feces: Secondary | ICD-10-CM

## 2021-02-04 DIAGNOSIS — F419 Anxiety disorder, unspecified: Secondary | ICD-10-CM

## 2021-02-04 DIAGNOSIS — R1032 Left lower quadrant pain: Secondary | ICD-10-CM

## 2021-02-04 NOTE — Patient Instructions (Signed)

## 2021-02-04 NOTE — Progress Notes (Signed)
[name] 70 y.o. 11/07/1950 [id number]  Assessment & Plan:   Encounter Diagnoses  Name Primary?   Watery diarrhea Yes   LLQ pain    Abnormal CT scan, esophagus    Esophageal dysphagia    Full incontinence of feces      Not sure where this diarrhea is coming from question if it could be related to thyroid issues but it sounds like that may be more of a laboratory abnormality as opposed to a true abnormality given that she was taking the vitamin with biotin in it.  I am going to evaluate for infection as below.  Pending that a colonoscopy may be indicated.  We do plan on an EGD because of the abnormal esophagus on CT scan and the dysphagia.  Orders Placed This Encounter  Procedures   Giardia/cryptosporidium (EIA)   Stool culture   Clostridium difficile Toxin B, Qualitative, Real-Time PCR   In the meantime she can use loperamide as needed.  CC: [name], DO   Subjective:   Chief Complaint: Lower abdominal pain and diarrhea  HPI 70 year old woman here because of complaints of diarrhea, and abdominal pain.  She saw Dr. Cheri Rous in August with left lower quadrant pain and postprandial diarrhea.  She was treated with Augmentin and had a CT scan which did not show any pathology i.e. no diverticulitis.  She does have a history of diverticulitis.  The abdominal pain seemed to get better but she has a persistent watery stool [place] stool scale's are 6 and 7 always.  She has had thyroid test abnormalities and Dr. Buddy Duty is working on this, it sounds like she had her dose reduced at about the time this started but she has not really improved with several postprandial watery stools a day and some urgency and some incontinence problems.  She has never had problems like this before and she had a colonoscopy for screening in 2019 with sigmoid diverticulosis and 2 diminutive polyps that were an adenoma and SSP.  She is not having nocturnal problems.  She does use sweet and low and  she is reducing that but she is used it for years.  She does not seem to be intolerant of milk products and really does not use them she is not on other artificial sweeteners or supplements.  She drinks water and not sodas.     11/28/20 [name], DO CT IMPRESSION: 1. No acute abdominopelvic findings. 2. Colonic diverticulosis without evidence of acute diverticulitis. 3. Moderate volume of stool within the colon. 4. Circumferential wall thickening of the distal esophagus, which may represent esophagitis. 5. Small hiatal hernia. 6. Aortic atherosclerosis (ICD10-I70.0).     Electronically Signed   By: Davina Poke D.O.   On: 11/25/2020 11:17  Wt Readings from Last 3 Encounters:  02/04/21 128 lb 6 oz (58.2 kg)  11/18/20 128 lb 9.6 oz (58.3 kg)  07/25/20 132 lb 3.2 oz (60 kg)     Allergies  Allergen Reactions   Crestor [Rosuvastatin Calcium] Other (See Comments)    Leg cramps   Hydrocodone-Acetaminophen Other (See Comments)    Nightmares, skin crawling   Current Meds  Medication Sig   albuterol (VENTOLIN HFA) 108 (90 Base) MCG/ACT inhaler Inhale 2 puffs into the lungs every 6 (six) hours as needed for wheezing or shortness of breath.   ALPRAZolam (XANAX) 0.5 MG tablet Take 1 tablet (0.5 mg total) by mouth 3 (three) times daily as needed.   azelastine (  ASTELIN) 0.1 % nasal spray Place 2 sprays into both nostrils 2 (two) times daily.   Blood Glucose Monitoring Suppl (ONE TOUCH ULTRA 2) w/Device KIT Check blood sugar as directed. Dx:E11.65   Blood Glucose Monitoring Suppl (ONETOUCH VERIO FLEX SYSTEM) w/Device KIT USE AS DIRECTED TO CHECK BLOOD SUGAR FOUR TIMES A DAY   FLUoxetine (PROZAC) 20 MG capsule TAKE ONE CAPSULE BY MOUTH DAILY   glucose blood (ONETOUCH ULTRA) test strip USE TO CHECK BLOOD SUGAR FOUR TIMES A DAY AS DIRECTED   hydrochlorothiazide (HYDRODIURIL) 25 MG tablet TAKE ONE TABLET BY MOUTH DAILY   Insulin Glargine (LANTUS SOLOSTAR) 100 UNIT/ML Solostar  Pen 30 u sq qpm   Insulin Pen Needle (NOVOTWIST) 32G X 5 MM MISC Use daily at Bedtime with Lantus   levothyroxine (SYNTHROID) 75 MCG tablet levothyroxine 75 mcg tablet   meloxicam (MOBIC) 15 MG tablet TAKE ONE TABLET BY MOUTH DAILY   metFORMIN (GLUCOPHAGE-XR) 500 MG 24 hr tablet 2 po qd   pantoprazole (PROTONIX) 40 MG tablet Take 1 tablet (40 mg total) by mouth daily.   traMADol (ULTRAM) 50 MG tablet Take 1 tablet (50 mg total) by mouth every 8 (eight) hours as needed.   valACYclovir (VALTREX) 1000 MG tablet TAKE ONE TABLET BY MOUTH DAILY AS NEEDED   Current Facility-Administered Medications for the 02/04/21 encounter (Office Visit) with Gatha Mayer, MD  Medication   0.9 %  sodium chloride infusion   ipratropium-albuterol (DUONEB) 0.5-2.5 (3) MG/3ML nebulizer solution 3 mL   Past Medical History:  Diagnosis Date   Arthritis    Diabetes mellitus    Esophagitis    Hiatal hernia    Hx of colonic polyps ssp and adenoma 03/06/2018   OSA on CPAP    Sleep apnea    Thyroid disease    TMJ (sprain of temporomandibular joint)    Past Surgical History:  Procedure Laterality Date   ABDOMINAL HYSTERECTOMY     BACK SURGERY     x3   BACK SURGERY     x 5 , 2 neck surgeries, 2 lower back surgeries   CHOLECYSTECTOMY     COLONOSCOPY     ERCP     ESOPHAGOGASTRODUODENOSCOPY     THYROID SURGERY     TONSILECTOMY, ADENOIDECTOMY, BILATERAL MYRINGOTOMY AND TUBES     Social History   Social History Narrative   Exercise--  3 flts steps a day for 8 hours   family history includes Alcohol abuse in her mother; Breast cancer in her cousin and daughter; Heart disease in her maternal grandfather; Stroke in her maternal grandmother and mother.   Review of Systems As above  Objective:   Physical Exam $RemoveBefor'@BP'rSBaNcZPESkK$  112/66   Pulse 92   Ht $R'4\' 11"'Pn$  (1.499 m)   Wt 128 lb 6 oz (58.2 kg)   BMI 25.93 kg/m @  General:  NAD Eyes:   anicteric Lungs:  clear Heart::  S1S2 no rubs, murmurs or gallops Abdomen:   soft and nontender, BS+

## 2021-02-05 NOTE — Telephone Encounter (Signed)
Requesting: xanax Contract: n/a UDS: n/a Last Visit:11/18/20 Next Visit:n/a Last Refill:12/18/20  Please Advise

## 2021-02-06 ENCOUNTER — Other Ambulatory Visit: Payer: PPO

## 2021-02-06 DIAGNOSIS — R197 Diarrhea, unspecified: Secondary | ICD-10-CM | POA: Diagnosis not present

## 2021-02-06 MED ORDER — ALPRAZOLAM 0.5 MG PO TABS: 0.5000 mg | ORAL_TABLET | Freq: Three times a day (TID) | ORAL | 0 refills | Status: DC | PRN

## 2021-02-07 LAB — GIARDIA AND CRYPTOSPORIDIUM ANTIGEN PANEL
MICRO NUMBER:: 12589594
RESULT:: NOT DETECTED
SPECIMEN QUALITY:: ADEQUATE
Specimen Quality:: ADEQUATE
micro Number:: 12589593

## 2021-02-07 LAB — CLOSTRIDIUM DIFFICILE TOXIN B, QUALITATIVE, REAL-TIME PCR: Toxigenic C. Difficile by PCR: NOT DETECTED

## 2021-02-10 ENCOUNTER — Other Ambulatory Visit: Payer: Self-pay

## 2021-02-10 DIAGNOSIS — R1310 Dysphagia, unspecified: Secondary | ICD-10-CM

## 2021-02-10 DIAGNOSIS — R197 Diarrhea, unspecified: Secondary | ICD-10-CM

## 2021-02-10 DIAGNOSIS — R933 Abnormal findings on diagnostic imaging of other parts of digestive tract: Secondary | ICD-10-CM

## 2021-02-10 LAB — STOOL CULTURE: E coli, Shiga toxin Assay: NEGATIVE

## 2021-02-10 MED ORDER — NA SULFATE-K SULFATE-MG SULF 17.5-3.13-1.6 GM/177ML PO SOLN: ORAL | 0 refills | Status: DC

## 2021-02-11 ENCOUNTER — Encounter: Payer: Self-pay | Admitting: Internal Medicine

## 2021-02-11 ENCOUNTER — Other Ambulatory Visit: Payer: Self-pay

## 2021-02-18 ENCOUNTER — Other Ambulatory Visit: Payer: Self-pay | Admitting: Family Medicine

## 2021-02-18 DIAGNOSIS — M1990 Unspecified osteoarthritis, unspecified site: Secondary | ICD-10-CM

## 2021-02-21 ENCOUNTER — Other Ambulatory Visit: Payer: Self-pay

## 2021-02-21 ENCOUNTER — Other Ambulatory Visit: Payer: Self-pay | Admitting: Family Medicine

## 2021-02-21 ENCOUNTER — Ambulatory Visit (INDEPENDENT_AMBULATORY_CARE_PROVIDER_SITE_OTHER): Payer: PPO | Admitting: Family Medicine

## 2021-02-21 VITALS — BP 100/68 | HR 107 | Temp 98.3°F | Resp 18 | Ht 59.0 in | Wt 130.6 lb

## 2021-02-21 DIAGNOSIS — R195 Other fecal abnormalities: Secondary | ICD-10-CM | POA: Diagnosis not present

## 2021-02-21 DIAGNOSIS — E1151 Type 2 diabetes mellitus with diabetic peripheral angiopathy without gangrene: Secondary | ICD-10-CM | POA: Diagnosis not present

## 2021-02-21 DIAGNOSIS — E1169 Type 2 diabetes mellitus with other specified complication: Secondary | ICD-10-CM

## 2021-02-21 DIAGNOSIS — E785 Hyperlipidemia, unspecified: Secondary | ICD-10-CM | POA: Diagnosis not present

## 2021-02-21 DIAGNOSIS — F418 Other specified anxiety disorders: Secondary | ICD-10-CM | POA: Diagnosis not present

## 2021-02-21 DIAGNOSIS — E1165 Type 2 diabetes mellitus with hyperglycemia: Secondary | ICD-10-CM

## 2021-02-21 DIAGNOSIS — N898 Other specified noninflammatory disorders of vagina: Secondary | ICD-10-CM | POA: Diagnosis not present

## 2021-02-21 DIAGNOSIS — Z23 Encounter for immunization: Secondary | ICD-10-CM

## 2021-02-21 DIAGNOSIS — Z794 Long term (current) use of insulin: Secondary | ICD-10-CM | POA: Diagnosis not present

## 2021-02-21 DIAGNOSIS — E039 Hypothyroidism, unspecified: Secondary | ICD-10-CM

## 2021-02-21 LAB — CBC WITH DIFFERENTIAL/PLATELET
Basophils Absolute: 0 10*3/uL (ref 0.0–0.1)
Basophils Relative: 0.3 % (ref 0.0–3.0)
Eosinophils Absolute: 0.2 10*3/uL (ref 0.0–0.7)
Eosinophils Relative: 2.1 % (ref 0.0–5.0)
HCT: 40.1 % (ref 36.0–46.0)
Hemoglobin: 13.2 g/dL (ref 12.0–15.0)
Lymphocytes Relative: 18.6 % (ref 12.0–46.0)
Lymphs Abs: 1.4 10*3/uL (ref 0.7–4.0)
MCHC: 33 g/dL (ref 30.0–36.0)
MCV: 90.4 fl (ref 78.0–100.0)
Monocytes Absolute: 0.9 10*3/uL (ref 0.1–1.0)
Monocytes Relative: 11.4 % (ref 3.0–12.0)
Neutro Abs: 5.3 10*3/uL (ref 1.4–7.7)
Neutrophils Relative %: 67.6 % (ref 43.0–77.0)
Platelets: 183 10*3/uL (ref 150.0–400.0)
RBC: 4.44 Mil/uL (ref 3.87–5.11)
RDW: 13.1 % (ref 11.5–15.5)
WBC: 7.8 10*3/uL (ref 4.0–10.5)

## 2021-02-21 LAB — LIPID PANEL
Cholesterol: 182 mg/dL (ref 0–200)
HDL: 46.1 mg/dL (ref >39.00)
LDL Cholesterol: 102 mg/dL — ABNORMAL HIGH (ref 0–99)
NonHDL: 136.1
Total CHOL/HDL Ratio: 4
Triglycerides: 169 mg/dL — ABNORMAL HIGH (ref 0.0–149.0)
VLDL: 33.8 mg/dL (ref 0.0–40.0)

## 2021-02-21 LAB — COMPREHENSIVE METABOLIC PANEL
ALT: 13 U/L (ref 0–35)
AST: 17 U/L (ref 0–37)
Albumin: 4.1 g/dL (ref 3.5–5.2)
Alkaline Phosphatase: 76 U/L (ref 39–117)
BUN: 21 mg/dL (ref 6–23)
CO2: 27 mEq/L (ref 19–32)
Calcium: 9.5 mg/dL (ref 8.4–10.5)
Chloride: 97 mEq/L (ref 96–112)
Creatinine, Ser: 0.85 mg/dL (ref 0.40–1.20)
GFR: 69.31 mL/min (ref >60.00)
Glucose, Bld: 209 mg/dL — ABNORMAL HIGH (ref 70–99)
Potassium: 4.3 mEq/L (ref 3.5–5.1)
Sodium: 136 mEq/L (ref 135–145)
Total Bilirubin: 0.5 mg/dL (ref 0.2–1.2)
Total Protein: 7.4 g/dL (ref 6.0–8.3)

## 2021-02-21 LAB — MICROALBUMIN / CREATININE URINE RATIO
Creatinine,U: 183.2 mg/dL
Microalb Creat Ratio: 4.2 mg/g (ref 0.0–30.0)
Microalb, Ur: 7.7 mg/dL — ABNORMAL HIGH (ref 0.0–1.9)

## 2021-02-21 LAB — HEMOGLOBIN A1C: Hgb A1c MFr Bld: 11.3 % — ABNORMAL HIGH (ref 4.6–6.5)

## 2021-02-21 LAB — TSH: TSH: 0.57 u[IU]/mL (ref 0.35–5.50)

## 2021-02-21 MED ORDER — ESTROGENS CONJUGATED 0.625 MG/GM VA CREA: 1.0000 | TOPICAL_CREAM | Freq: Every day | VAGINAL | 12 refills | Status: DC

## 2021-02-21 MED ORDER — FLUOXETINE HCL 20 MG PO CAPS: 20.0000 mg | ORAL_CAPSULE | Freq: Every day | ORAL | 3 refills | Status: DC

## 2021-02-21 NOTE — Progress Notes (Signed)
Subjective:   By signing my name below, I, Zite Okoli, attest that this documentation has been prepared under the direction and in the presence of [name], DO. 02/21/2021     Patient ID: [name], female    DOB: 1950/12/31, 70 y.o.   MRN: [id number]  Chief Complaint  Patient presents with   Diabetes   Hypothyroidism   Follow-up    HPI Patient is in today for an office visit.  She will be going to [place] after thanksgiving.   She is requesting for a refill on 20 mg Prozac.  She has a visit with her endocrinologist later today.  She started taking medication to help with her hair, skin and nails and mentions it wrecked her thyroid levels. She has started taking 75 mcg synthroid.   She also reports that sexual intercourse has become more painful despite trying lubrication and vaginal creams. She would like an estrogen cream.  She has 2 Covid-19 vaccines and is not interested in getting a booster vaccine. She will get the shingles vaccine next year. She has received the flu vaccine and will receive the pneumonia vaccine at her next visit.   Past Medical History:  Diagnosis Date   Arthritis    Diabetes mellitus    Esophagitis    Hiatal hernia    Hx of colonic polyps ssp and adenoma 03/06/2018   OSA on CPAP    Sleep apnea    Thyroid disease    TMJ (sprain of temporomandibular joint)     Past Surgical History:  Procedure Laterality Date   ABDOMINAL HYSTERECTOMY     BACK SURGERY     x3   BACK SURGERY     x 5 , 2 neck surgeries, 2 lower back surgeries   CHOLECYSTECTOMY     COLONOSCOPY     ERCP     ESOPHAGOGASTRODUODENOSCOPY     THYROID SURGERY     TONSILECTOMY, ADENOIDECTOMY, BILATERAL MYRINGOTOMY AND TUBES      Family History  Problem Relation Age of Onset   Stroke Mother    Alcohol abuse Mother    Stroke Maternal Grandmother    Heart disease Maternal Grandfather    Breast cancer Cousin    Breast cancer Daughter    Colon cancer Neg Hx     Esophageal cancer Neg Hx    Rectal cancer Neg Hx    Stomach cancer Neg Hx     Social History   Socioeconomic History   Marital status: Married    Spouse name: Not on file   Number of children: Not on file   Years of education: Not on file   Highest education level: Not on file  Occupational History   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    Employer: [employer]  Tobacco Use   Smoking status: Former    Years: 48.00    Types: Cigarettes    Quit date: 02/01/2017    Years since quitting: 4.0   Smokeless tobacco: Never  Vaping Use   Vaping Use: Never used  Substance and Sexual Activity   Alcohol use: No    Alcohol/week: 0.0 standard drinks   Drug use: No   Sexual activity: Yes    Partners: Male  Other Topics Concern   Not on file  Social History Narrative   Exercise--  3 flts steps a day for 8 hours   Social Determinants of Health   Financial Resource Strain: Not on file  Food Insecurity: Not on  file  Transportation Needs: Not on file  Physical Activity: Not on file  Stress: Not on file  Social Connections: Not on file  Intimate Partner Violence: Not on file    Outpatient Medications Prior to Visit  Medication Sig Dispense Refill   albuterol (VENTOLIN HFA) 108 (90 Base) MCG/ACT inhaler Inhale 2 puffs into the lungs every 6 (six) hours as needed for wheezing or shortness of breath. 8 g 2   ALPRAZolam (XANAX) 0.5 MG tablet Take 1 tablet (0.5 mg total) by mouth 3 (three) times daily as needed. 90 tablet 0   azelastine (ASTELIN) 0.1 % nasal spray Place 2 sprays into both nostrils 2 (two) times daily. 30 mL 6   Blood Glucose Monitoring Suppl (ONE TOUCH ULTRA 2) w/Device KIT Check blood sugar as directed. Dx:E11.65 1 kit 0   Blood Glucose Monitoring Suppl (ONETOUCH VERIO FLEX SYSTEM) w/Device KIT USE AS DIRECTED TO CHECK BLOOD SUGAR FOUR TIMES A DAY 1 kit 0   glucose blood (ONETOUCH ULTRA) test strip USE TO CHECK BLOOD SUGAR FOUR TIMES A DAY AS DIRECTED 400 strip 1    hydrochlorothiazide (HYDRODIURIL) 25 MG tablet TAKE ONE TABLET BY MOUTH DAILY 90 tablet 1   Insulin Glargine (LANTUS SOLOSTAR) 100 UNIT/ML Solostar Pen 30 u sq qpm 15 mL 0   Insulin Pen Needle (NOVOTWIST) 32G X 5 MM MISC Use daily at Bedtime with Lantus 100 each 0   levothyroxine (SYNTHROID) 75 MCG tablet levothyroxine 75 mcg tablet     meloxicam (MOBIC) 15 MG tablet TAKE ONE TABLET BY MOUTH DAILY 90 tablet 0   metFORMIN (GLUCOPHAGE-XR) 500 MG 24 hr tablet 2 po qd 180 tablet 3   Na Sulfate-K Sulfate-Mg Sulf 17.5-3.13-1.6 GM/177ML SOLN Colonoscopy Prep 354 mL 0   pantoprazole (PROTONIX) 40 MG tablet Take 1 tablet (40 mg total) by mouth daily. 30 tablet 11   traMADol (ULTRAM) 50 MG tablet Take 1 tablet (50 mg total) by mouth every 8 (eight) hours as needed. 30 tablet 0   valACYclovir (VALTREX) 1000 MG tablet TAKE ONE TABLET BY MOUTH DAILY AS NEEDED 30 tablet 0   FLUoxetine (PROZAC) 20 MG capsule TAKE ONE CAPSULE BY MOUTH DAILY 30 capsule 0   Facility-Administered Medications Prior to Visit  Medication Dose Route Frequency Provider Last Rate Last Admin   0.9 %  sodium chloride infusion  500 mL Intravenous Once Gatha Mayer, MD       ipratropium-albuterol (DUONEB) 0.5-2.5 (3) MG/3ML nebulizer solution 3 mL  3 mL Nebulization Q6H Lowne Chase, Yvonne R, DO        Allergies  Allergen Reactions   Crestor [Rosuvastatin Calcium] Other (See Comments)    Leg cramps   Hydrocodone-Acetaminophen Other (See Comments)    Nightmares, skin crawling    Review of Systems  Constitutional:  Negative for fever and malaise/fatigue.  HENT:  Negative for congestion, ear pain, hearing loss, sinus pain and sore throat.   Eyes:  Negative for blurred vision and pain.  Respiratory:  Negative for cough, sputum production, shortness of breath and wheezing.   Cardiovascular:  Negative for chest pain, palpitations and leg swelling.  Gastrointestinal:  Negative for blood in stool, constipation, diarrhea, nausea and  vomiting.  Genitourinary:  Negative for dysuria, frequency, hematuria and urgency.  Musculoskeletal:  Negative for back pain, falls and myalgias.  Skin:  Negative for rash.  Neurological:  Negative for dizziness, sensory change, loss of consciousness, weakness and headaches.  Endo/Heme/Allergies:  Negative for environmental allergies. Does not  bruise/bleed easily.  Psychiatric/Behavioral:  Negative for depression and suicidal ideas. The patient is not nervous/anxious and does not have insomnia.       Objective:    Physical Exam Vitals and nursing note reviewed.  Constitutional:      General: She is not in acute distress.    Appearance: Normal appearance. She is not ill-appearing.  HENT:     Head: Normocephalic and atraumatic.     Right Ear: External ear normal.     Left Ear: External ear normal.  Eyes:     Extraocular Movements: Extraocular movements intact.     Pupils: Pupils are equal, round, and reactive to light.  Cardiovascular:     Rate and Rhythm: Normal rate and regular rhythm.     Pulses: Normal pulses.     Heart sounds: Normal heart sounds. No murmur heard.   No gallop.  Pulmonary:     Effort: Pulmonary effort is normal. No respiratory distress.     Breath sounds: Normal breath sounds. No wheezing, rhonchi or rales.  Abdominal:     General: Bowel sounds are normal. There is no distension.     Palpations: Abdomen is soft. There is no mass.     Tenderness: There is no abdominal tenderness. There is no guarding or rebound.     Hernia: No hernia is present.  Musculoskeletal:     Cervical back: Normal range of motion and neck supple.  Lymphadenopathy:     Cervical: No cervical adenopathy.  Skin:    General: Skin is warm and dry.  Neurological:     Mental Status: She is alert and oriented to person, place, and time.  Psychiatric:        Behavior: Behavior normal.    BP 100/68 (BP Location: Left Arm, Patient Position: Sitting, Cuff Size: Normal)   Pulse (!) 107    Temp 98.3 F (36.8 C) (Oral)   Resp 18   Ht 4' 11" (1.499 m)   Wt 130 lb 9.6 oz (59.2 kg)   SpO2 94%   BMI 26.38 kg/m  Wt Readings from Last 3 Encounters:  02/21/21 130 lb 9.6 oz (59.2 kg)  02/04/21 128 lb 6 oz (58.2 kg)  11/18/20 128 lb 9.6 oz (58.3 kg)    Diabetic Foot Exam - Simple   Simple Foot Form Diabetic Foot exam was performed with the following findings: Yes 02/21/2021 12:48 PM  Visual Inspection No deformities, no ulcerations, no other skin breakdown bilaterally: Yes Sensation Testing Intact to touch and monofilament testing bilaterally: Yes Pulse Check Posterior Tibialis and Dorsalis pulse intact bilaterally: Yes Comments    Lab Results  Component Value Date   WBC 6.3 11/18/2020   HGB 13.2 11/18/2020   HCT 39.3 11/18/2020   PLT 199.0 11/18/2020   GLUCOSE 249 (H) 11/18/2020   CHOL 175 07/03/2019   TRIG 164.0 (H) 07/03/2019   HDL 40.10 07/03/2019   LDLDIRECT 162.0 03/28/2015   LDLCALC 102 (H) 07/03/2019   ALT 17 11/18/2020   AST 16 11/18/2020   NA 136 11/18/2020   K 4.3 11/18/2020   CL 98 11/18/2020   CREATININE 0.72 11/18/2020   BUN 17 11/18/2020   CO2 30 11/18/2020   TSH 0.06 (L) 11/18/2020   HGBA1C 10.5 (H) 07/03/2019   MICROALBUR 0.9 07/03/2019    Lab Results  Component Value Date   TSH 0.06 (L) 11/18/2020   Lab Results  Component Value Date   WBC 6.3 11/18/2020   HGB 13.2 11/18/2020  HCT 39.3 11/18/2020   MCV 90.0 11/18/2020   PLT 199.0 11/18/2020   Lab Results  Component Value Date   NA 136 11/18/2020   K 4.3 11/18/2020   CO2 30 11/18/2020   GLUCOSE 249 (H) 11/18/2020   BUN 17 11/18/2020   CREATININE 0.72 11/18/2020   BILITOT 0.4 11/18/2020   ALKPHOS 64 11/18/2020   AST 16 11/18/2020   ALT 17 11/18/2020   PROT 7.7 11/18/2020   ALBUMIN 4.1 11/18/2020   CALCIUM 10.1 11/18/2020   ANIONGAP 10 10/16/2018   GFR 84.74 11/18/2020   Lab Results  Component Value Date   CHOL 175 07/03/2019   Lab Results  Component Value Date    HDL 40.10 07/03/2019   Lab Results  Component Value Date   LDLCALC 102 (H) 07/03/2019   Lab Results  Component Value Date   TRIG 164.0 (H) 07/03/2019   Lab Results  Component Value Date   CHOLHDL 4 07/03/2019   Lab Results  Component Value Date   HGBA1C 10.5 (H) 07/03/2019       Assessment & Plan:   Problem List Items Addressed This Visit       Unprioritized   Hyperlipidemia LDL goal <70    Encourage heart healthy diet such as MIND or DASH diet, increase exercise, avoid trans fats, simple carbohydrates and processed foods, consider a krill or fish or flaxseed oil cap daily.       Hypothyroidism    Per endo      Relevant Orders   TSH   Type 2 diabetes mellitus with diabetic peripheral angiopathy without gangrene, without long-term current use of insulin ([place])   Other Visit Diagnoses     Hyperlipidemia associated with type 2 diabetes mellitus ([place])    -  Primary   Relevant Orders   CBC with Differential/Platelet   Comprehensive metabolic panel   Lipid panel   Depression with anxiety       Relevant Medications   FLUoxetine (PROZAC) 20 MG capsule   Type 2 diabetes mellitus with hyperglycemia, without long-term current use of insulin ([facility])       Relevant Orders   Hemoglobin A1c   Microalbumin / creatinine urine ratio   Need for pneumococcal vaccination       Vaginal dryness       Relevant Medications   conjugated estrogens (PREMARIN) vaginal cream   Other Relevant Orders   Ambulatory referral to Obstetrics / Gynecology        Meds ordered this encounter  Medications   FLUoxetine (PROZAC) 20 MG capsule    Sig: Take 1 capsule (20 mg total) by mouth daily.    Dispense:  90 capsule    Refill:  3    Requested drug refills are authorized, however, the patient needs further evaluation and/or laboratory testing before further refills are given. Ask her to make an appointment for this.   conjugated estrogens (PREMARIN) vaginal cream    Sig: Place 1  Applicatorful vaginally daily.    Dispense:  42.5 g    Refill:  12    I,Zite Okoli,acting as a scribe for [employer], DO.,have documented all relevant documentation on the behalf of [name], DO,as directed by  [name], DO while in the presence of [name], DO.   I, [name], DO. , personally preformed the services described in this documentation.  All medical record entries made by the scribe were at my  direction and in my presence.  I have reviewed the chart and discharge instructions (if applicable) and agree that the record reflects my personal performance and is accurate and complete. 02/21/2021

## 2021-02-21 NOTE — Assessment & Plan Note (Signed)
Per endo °

## 2021-02-21 NOTE — Assessment & Plan Note (Signed)
Encourage heart healthy diet such as MIND or DASH diet, increase exercise, avoid trans fats, simple carbohydrates and processed foods, consider a krill or fish or flaxseed oil cap daily.  °

## 2021-02-21 NOTE — Patient Instructions (Signed)
Atrophic Vaginitis Atrophic vaginitis is a condition in which the tissues that line the vagina become dry and thin. This condition is most common in women who have stopped having regular menstrual periods (are in menopause). This usually starts when a woman is 39 to 70 years old. That is the time when a woman's estrogen levels begin to decrease. Estrogen is a female hormone. It helps to keep the tissues of the vagina moist. It stimulates the vagina to produce a clear fluid that lubricates the vagina for sex. This fluid also protects the vagina from infection. Lack of estrogen can cause the lining of the vagina to get thinner and dryer. The vagina may also shrink in size. It may become less elastic. Atrophic vaginitis tends to get worse over time as a woman's estrogen level drops. What are the causes? This condition is caused by the normal drop in estrogen that happens around the time of menopause. What increases the risk? Certain conditions or situations may lower a woman's estrogen level, leading to a higher risk for atrophic vaginitis. You are more likely to develop this condition if: You are taking medicines that block estrogen. You have had your ovaries removed. You are being treated for cancer with radiation or medicines (chemotherapy). You have given birth or are breastfeeding. You are older than age 46. You smoke. What are the signs or symptoms? Symptoms of this condition include: Pain, soreness, a feeling of pressure, or bleeding during sex (dyspareunia). Vaginal burning, irritation, or itching. Pain or bleeding when a speculum is used in a vaginal exam. Having burning pain while urinating. Vaginal discharge. In some cases, there are no symptoms. How is this diagnosed? This condition is diagnosed based on your medical history and a physical exam. This will include a pelvic exam that checks the vaginal tissues. Though rare, you may also have other tests, including: A urine test. A test  that checks the acid balance in your vagina (acid balance test). How is this treated? Treatment for this condition depends on how severe your symptoms are. Treatment may include: Using an over-the-counter vaginal lubricant before sex. Using a long-acting vaginal moisturizer. Using low-dose estrogen for moderate to severe symptoms that do not respond to other treatments. Options include creams, tablets, and inserts (vaginal rings). Before you use a vaginal estrogen, tell your health care provider if you have a history of: Breast cancer. Endometrial cancer. Blood clots. If you are not sexually active and your symptoms are very mild, you may not need treatment. Follow these instructions at home: Medicines Take over-the-counter and prescription medicines only as told by your health care provider. Do not use herbal or alternative medicines unless your health care provider says that you can. Use over-the-counter creams, lubricants, or moisturizers for dryness only as told by your health care provider. General instructions If your atrophic vaginitis is caused by menopause, discuss all of your menopause symptoms and treatment options with your health care provider. Do not douche. Do not use products that can make your vagina dry. These include: Scented feminine sprays. Scented tampons. Scented soaps. Vaginal sex can help to improve blood flow and elasticity of vaginal tissue. If you choose to have sex and it hurts, try using a water-soluble lubricant or moisturizer right before having sex. Contact a health care provider if: Your discharge looks different than normal. Your vagina has an unusual smell. You have new symptoms. Your symptoms do not improve with treatment. Your symptoms get worse. Summary Atrophic vaginitis is a condition in  which the tissues that line the vagina become dry and thin. It is most common in women who have stopped having regular menstrual periods (are in  menopause). Treatment options include using vaginal lubricants and low-dose vaginal estrogen. Contact a health care provider if your vagina has an unusual smell, or if your symptoms get worse or do not improve after treatment. This information is not intended to replace advice given to you by your health care provider. Make sure you discuss any questions you have with your health care provider. Document Revised: 09/21/2019 Document Reviewed: 09/21/2019 Elsevier Patient Education  [employer].

## 2021-02-22 ENCOUNTER — Other Ambulatory Visit: Payer: Self-pay | Admitting: Family Medicine

## 2021-02-22 DIAGNOSIS — E785 Hyperlipidemia, unspecified: Secondary | ICD-10-CM

## 2021-02-24 MED ORDER — TRAMADOL HCL 50 MG PO TABS: 50.0000 mg | ORAL_TABLET | Freq: Three times a day (TID) | ORAL | 0 refills | Status: DC | PRN

## 2021-02-24 NOTE — Telephone Encounter (Signed)
Requesting: tramadol Contract:n/a UDS:n/a Last Visit:02/21/21 Next Visit:n/a Last Refill:04/26/20  Please Advise

## 2021-03-04 ENCOUNTER — Telehealth: Payer: Self-pay

## 2021-03-04 ENCOUNTER — Other Ambulatory Visit: Payer: Self-pay

## 2021-03-04 NOTE — Telephone Encounter (Signed)
Please switch to MiraLAx split prep

## 2021-03-04 NOTE — Telephone Encounter (Signed)
Patient's husband called.  Pt is in [place] and is aware that her procedure is on 04/17/21.  He also asked if there was a less expensive prep, as the one sent to the pharmacy was $100.  Please call patient's husband and advise.  If he does not answer, it is OK to leave a detailed voicemail.  Thank you.

## 2021-03-04 NOTE — Telephone Encounter (Signed)
Notified pt husband  Vicente Serene that Dr. Carlean Purl switched it to the Miralax split prep.  Prep instructions were created for pt and sent via pt my chart and mail. Vicente Serene made aware. Vicente Serene Verbalized understanding with all questions answered.

## 2021-03-04 NOTE — Telephone Encounter (Signed)
Pt states that prep for pt Colonoscopy is $100. Pt husband questioning is there a cheaper prep that she could use. Please advise

## 2021-03-04 NOTE — Telephone Encounter (Signed)
Left message for pt to call back to ensure that pt is aware of the date and time of her Endo/Colon on 04/17/2021 with Dr. Carlean Purl

## 2021-03-21 ENCOUNTER — Encounter: Payer: PPO | Admitting: Internal Medicine

## 2021-03-25 DIAGNOSIS — E039 Hypothyroidism, unspecified: Secondary | ICD-10-CM | POA: Diagnosis not present

## 2021-04-11 DIAGNOSIS — M5416 Radiculopathy, lumbar region: Secondary | ICD-10-CM | POA: Diagnosis not present

## 2021-04-11 DIAGNOSIS — Z79899 Other long term (current) drug therapy: Secondary | ICD-10-CM | POA: Diagnosis not present

## 2021-04-11 DIAGNOSIS — Z79891 Long term (current) use of opiate analgesic: Secondary | ICD-10-CM | POA: Diagnosis not present

## 2021-04-11 DIAGNOSIS — G894 Chronic pain syndrome: Secondary | ICD-10-CM | POA: Diagnosis not present

## 2021-04-11 DIAGNOSIS — M4326 Fusion of spine, lumbar region: Secondary | ICD-10-CM | POA: Diagnosis not present

## 2021-04-11 DIAGNOSIS — M4726 Other spondylosis with radiculopathy, lumbar region: Secondary | ICD-10-CM | POA: Diagnosis not present

## 2021-04-11 DIAGNOSIS — Z6827 Body mass index (BMI) 27.0-27.9, adult: Secondary | ICD-10-CM | POA: Diagnosis not present

## 2021-04-15 ENCOUNTER — Other Ambulatory Visit: Payer: Self-pay | Admitting: Orthopaedic Surgery

## 2021-04-15 DIAGNOSIS — M4726 Other spondylosis with radiculopathy, lumbar region: Secondary | ICD-10-CM

## 2021-04-15 DIAGNOSIS — M5416 Radiculopathy, lumbar region: Secondary | ICD-10-CM

## 2021-04-16 ENCOUNTER — Encounter: Payer: Self-pay | Admitting: Certified Registered Nurse Anesthetist

## 2021-04-17 ENCOUNTER — Ambulatory Visit (AMBULATORY_SURGERY_CENTER): Payer: PPO | Admitting: Internal Medicine

## 2021-04-17 ENCOUNTER — Encounter: Payer: Self-pay | Admitting: Internal Medicine

## 2021-04-17 VITALS — BP 115/71 | HR 87 | Temp 97.0°F | Resp 20 | Ht 59.0 in | Wt 128.0 lb

## 2021-04-17 DIAGNOSIS — R197 Diarrhea, unspecified: Secondary | ICD-10-CM

## 2021-04-17 DIAGNOSIS — K2950 Unspecified chronic gastritis without bleeding: Secondary | ICD-10-CM | POA: Diagnosis not present

## 2021-04-17 DIAGNOSIS — E079 Disorder of thyroid, unspecified: Secondary | ICD-10-CM | POA: Diagnosis not present

## 2021-04-17 DIAGNOSIS — R1310 Dysphagia, unspecified: Secondary | ICD-10-CM | POA: Diagnosis not present

## 2021-04-17 DIAGNOSIS — G4733 Obstructive sleep apnea (adult) (pediatric): Secondary | ICD-10-CM | POA: Diagnosis not present

## 2021-04-17 DIAGNOSIS — K449 Diaphragmatic hernia without obstruction or gangrene: Secondary | ICD-10-CM | POA: Diagnosis not present

## 2021-04-17 DIAGNOSIS — K222 Esophageal obstruction: Secondary | ICD-10-CM | POA: Diagnosis not present

## 2021-04-17 DIAGNOSIS — K635 Polyp of colon: Secondary | ICD-10-CM | POA: Diagnosis not present

## 2021-04-17 DIAGNOSIS — K5730 Diverticulosis of large intestine without perforation or abscess without bleeding: Secondary | ICD-10-CM

## 2021-04-17 DIAGNOSIS — K2970 Gastritis, unspecified, without bleeding: Secondary | ICD-10-CM | POA: Diagnosis not present

## 2021-04-17 DIAGNOSIS — D125 Benign neoplasm of sigmoid colon: Secondary | ICD-10-CM

## 2021-04-17 DIAGNOSIS — K2289 Other specified disease of esophagus: Secondary | ICD-10-CM | POA: Diagnosis not present

## 2021-04-17 DIAGNOSIS — R1032 Left lower quadrant pain: Secondary | ICD-10-CM

## 2021-04-17 MED ORDER — SODIUM CHLORIDE 0.9 % IV SOLN
500.0000 mL | INTRAVENOUS | Status: DC
Start: 2021-04-17 — End: 2021-05-15

## 2021-04-17 NOTE — Progress Notes (Signed)
1011 Robinul 0.1 mg IV given due large amount of secretions upon assessment.  MD made aware, vss 

## 2021-04-17 NOTE — Op Note (Signed)
[facility] Patient Name: [name] Procedure Date: 04/17/2021 10:06 AM MRN: [id number] Endoscopist: Gatha Mayer , MD Age: 71 Referring MD:  Date of Birth: 1950-09-17 Gender: Female Account #: [id number] Procedure:                Colonoscopy Indications:              Clinically significant diarrhea of unexplained                            origin Medicines:                Propofol per Anesthesia, Monitored Anesthesia Care Procedure:                Pre-Anesthesia Assessment:                           - Prior to the procedure, a History and Physical                            was performed, and patient medications and                            allergies were reviewed. The patient's tolerance of                            previous anesthesia was also reviewed. The risks                            and benefits of the procedure and the sedation                            options and risks were discussed with the patient.                            All questions were answered, and informed consent                            was obtained. Prior Anticoagulants: The patient has                            taken no previous anticoagulant or antiplatelet                            agents. ASA Grade Assessment: II - A patient with                            mild systemic disease. After reviewing the risks                            and benefits, the patient was deemed in                            satisfactory condition to undergo the procedure.  After obtaining informed consent, the colonoscope                            was passed under direct vision. Throughout the                            procedure, the patient's blood pressure, pulse, and                            oxygen saturations were monitored continuously. The                            Olympus [id number]-[id number]) Colonoscope was                            introduced through the anus and  advanced to the the                            terminal ileum, with identification of the                            appendiceal orifice and IC valve. The colonoscopy                            was performed without difficulty. The patient                            tolerated the procedure well. The quality of the                            bowel preparation was adequate. The terminal ileum,                            ileocecal valve, appendiceal orifice, and rectum                            were photographed. Scope In: 10:36:52 AM Scope Out: 10:53:05 AM Scope Withdrawal Time: 0 hours 12 minutes 0 seconds  Total Procedure Duration: 0 hours 16 minutes 13 seconds  Findings:                 The perianal and digital rectal examinations were                            normal.                           The terminal ileum appeared normal.                           A diminutive polyp was found in the sigmoid colon.                            The polyp was sessile. The polyp was removed with a  cold biopsy forceps. Resection and retrieval were                            complete. Verification of patient identification                            for the specimen was done. Estimated blood loss was                            minimal.                           Multiple small and large-mouthed diverticula were                            found in the sigmoid colon.                           The exam was otherwise without abnormality on                            direct and retroflexion views.                           Biopsies for histology were taken with a cold                            forceps from the ascending colon, transverse colon,                            descending colon and sigmoid colon for evaluation                            of microscopic colitis. Complications:            No immediate complications. Estimated Blood Loss:     Estimated blood loss was  minimal. Impression:               - The examined portion of the ileum was normal.                           - One diminutive polyp in the sigmoid colon,                            removed with a cold biopsy forceps. Resected and                            retrieved.                           - Diverticulosis in the sigmoid colon.                           - The examination was otherwise normal on direct  and retroflexion views.                           - Biopsies were taken with a cold forceps from the                            ascending colon, transverse colon, descending colon                            and sigmoid colon for evaluation of microscopic                            colitis. Recommendation:           - Patient has a contact number available for                            emergencies. The signs and symptoms of potential                            delayed complications were discussed with the                            patient. Return to normal activities tomorrow.                            Written discharge instructions were provided to the                            patient.                           - Continue present medications.                           - See the other procedure note for documentation of                            additional recommendations.                           - Await pathology results. Gatha Mayer, MD 04/17/2021 11:05:54 AM This report has been signed electronically.

## 2021-04-17 NOTE — Op Note (Signed)
[facility] Patient Name: [name] Procedure Date: 04/17/2021 10:07 AM MRN: [id number] Endoscopist: Gatha Mayer , MD Age: 71 Referring MD:  Date of Birth: 1951/02/18 Gender: Female Account #: [id number] Procedure:                Upper GI endoscopy Indications:              Dysphagia, Abnormal CT of the GI tract Medicines:                Propofol per Anesthesia, Monitored Anesthesia Care Procedure:                Pre-Anesthesia Assessment:                           - Prior to the procedure, a History and Physical                            was performed, and patient medications and                            allergies were reviewed. The patient's tolerance of                            previous anesthesia was also reviewed. The risks                            and benefits of the procedure and the sedation                            options and risks were discussed with the patient.                            All questions were answered, and informed consent                            was obtained. Prior Anticoagulants: The patient has                            taken no previous anticoagulant or antiplatelet                            agents. ASA Grade Assessment: II - A patient with                            mild systemic disease. After reviewing the risks                            and benefits, the patient was deemed in                            satisfactory condition to undergo the procedure.                           After obtaining informed consent, the endoscope was  passed under direct vision. Throughout the                            procedure, the patient's blood pressure, pulse, and                            oxygen saturations were monitored continuously. The                            GIF [id number] #[id number] was introduced through the                            mouth, and advanced to the second part of duodenum.                             The upper GI endoscopy was accomplished without                            difficulty. The patient tolerated the procedure                            well. Scope In: Scope Out: Findings:                 The examined esophagus was mildly tortuous.                           One benign-appearing, intrinsic moderate                            (circumferential scarring or stenosis; an endoscope                            may pass) stenosis was found at the                            gastroesophageal junction. The stenosis was                            traversed. A TTS dilator was passed through the                            scope. Dilation with an 18-19-20 mm balloon dilator                            was performed to 20 mm. The dilation site was                            examined and showed mild mucosal disruption.                            Estimated blood loss was minimal.                           The gastroesophageal flap valve was visualized  endoscopically and classified as Hill Grade IV (no                            fold, wide open lumen, hiatal hernia present).                           A small hiatal hernia was present.                           Mild inflammation characterized by erythema and                            friability was found in the gastric body and in the                            gastric antrum. Biopsies were taken with a cold                            forceps for histology. Verification of patient                            identification for the specimen was done. Estimated                            blood loss was minimal.                           The exam was otherwise without abnormality.                           The cardia and gastric fundus were normal on                            retroflexion. Complications:            No immediate complications. Estimated Blood Loss:     Estimated blood loss was minimal. Impression:                - Tortuous esophagus.                           - Benign-appearing esophageal stenosis. Dilated.                           - Gastroesophageal flap valve classified as Hill                            Grade IV (no fold, wide open lumen, hiatal hernia                            present).                           - Small hiatal hernia. Sliding                           -  Gastritis. Biopsied.                           - The examination was otherwise normal. Recommendation:           - Patient has a contact number available for                            emergencies. The signs and symptoms of potential                            delayed complications were discussed with the                            patient. Return to normal activities tomorrow.                            Written discharge instructions were provided to the                            patient.                           - Clear liquids x 1 hour then soft foods rest of                            day. Start prior diet tomorrow.                           - Await pathology results. Gatha Mayer, MD 04/17/2021 11:03:12 AM This report has been signed electronically.

## 2021-04-17 NOTE — Progress Notes (Signed)
[facility] History and Physical   Primary Care Physician:  [name], DO   Reason for Procedure:   Dysphagia, thick esophagus on CT and diarrhea  Plan:    EGD, esophageal dilation, colonoscopy     HPI: [name] is a 71 y.o. female here for evaluation of above. See also 02/04/21 note   Past Medical History:  Diagnosis Date   Arthritis    Diabetes mellitus    Esophagitis    Hiatal hernia    Hx of colonic polyps ssp and adenoma 03/06/2018   OSA on CPAP    Sleep apnea    Thyroid disease    TMJ (sprain of temporomandibular joint)     Past Surgical History:  Procedure Laterality Date   ABDOMINAL HYSTERECTOMY     BACK SURGERY     x3   BACK SURGERY     x 5 , 2 neck surgeries, 2 lower back surgeries   CHOLECYSTECTOMY     COLONOSCOPY     ERCP     ESOPHAGOGASTRODUODENOSCOPY     THYROID SURGERY     TONSILECTOMY, ADENOIDECTOMY, BILATERAL MYRINGOTOMY AND TUBES      Prior to Admission medications   Medication Sig Start Date End Date Taking? Authorizing Provider  ALPRAZolam Duanne Moron) 0.5 MG tablet Take 1 tablet (0.5 mg total) by mouth 3 (three) times daily as needed. 02/06/21  Yes Roma Schanz R, DO  Blood Glucose Monitoring Suppl (ONE TOUCH ULTRA 2) w/Device KIT Check blood sugar as directed. Dx:E11.65 09/26/18  Yes Roma Schanz R, DO  Blood Glucose Monitoring Suppl (ONETOUCH VERIO FLEX SYSTEM) w/Device KIT USE AS DIRECTED TO CHECK BLOOD SUGAR FOUR TIMES A DAY 11/30/19  Yes Roma Schanz R, DO  glucose blood (ONETOUCH ULTRA) test strip USE TO CHECK BLOOD SUGAR FOUR TIMES A DAY AS DIRECTED 11/01/20  Yes Carollee Herter, Kendrick Fries R, DO  hydrochlorothiazide (HYDRODIURIL) 25 MG tablet TAKE ONE TABLET BY MOUTH DAILY 12/18/20  Yes Carollee Herter, Kendrick Fries R, DO  Insulin Glargine (LANTUS SOLOSTAR) 100 UNIT/ML Solostar Pen 30 u sq qpm 02/28/19  Yes Lowne Lyndal Pulley R, DO  Insulin Pen Needle (NOVOTWIST) 32G X 5 MM MISC Use daily at Bedtime with Lantus  02/17/12  Yes [name], DO  levothyroxine (SYNTHROID) 75 MCG tablet levothyroxine 75 mcg tablet   Yes [provider]  meloxicam (MOBIC) 15 MG tablet TAKE ONE TABLET BY MOUTH DAILY 02/18/21  Yes Roma Schanz R, DO  metFORMIN (GLUCOPHAGE-XR) 500 MG 24 hr tablet 2 po qd 03/19/20  Yes Lowne Lyndal Pulley R, DO  pantoprazole (PROTONIX) 40 MG tablet Take 1 tablet (40 mg total) by mouth daily. 08/06/14  Yes [name], DO  traMADol (ULTRAM) 50 MG tablet Take 1 tablet (50 mg total) by mouth every 8 (eight) hours as needed. 02/24/21  Yes Roma Schanz R, DO  valACYclovir (VALTREX) 1000 MG tablet TAKE ONE TABLET BY MOUTH DAILY AS NEEDED 03/27/20  Yes Roma Schanz R, DO  albuterol (VENTOLIN HFA) 108 (90 Base) MCG/ACT inhaler Inhale 2 puffs into the lungs every 6 (six) hours as needed for wheezing or shortness of breath. 04/02/20   Colon Branch, MD  azelastine (ASTELIN) 0.1 % nasal spray Place 2 sprays into both nostrils 2 (two) times daily. Patient not taking: Reported on 04/17/2021 04/02/20   Colon Branch, MD  conjugated estrogens (PREMARIN) vaginal cream Place 1 Applicatorful vaginally daily. Patient not taking: Reported  on 04/17/2021 02/21/21   [name], DO  FLUoxetine (PROZAC) 20 MG capsule Take 1 capsule (20 mg total) by mouth daily. Patient not taking: Reported on 04/17/2021 02/21/21   Carollee Herter, Kendrick Fries R, DO  TRULICITY 4.26 ST/4.1DQ SOPN Inject into the skin. 03/14/21   [provider]    Current Outpatient Medications  Medication Sig Dispense Refill   ALPRAZolam (XANAX) 0.5 MG tablet Take 1 tablet (0.5 mg total) by mouth 3 (three) times daily as needed. 90 tablet 0   Blood Glucose Monitoring Suppl (ONE TOUCH ULTRA 2) w/Device KIT Check blood sugar as directed. Dx:E11.65 1 kit 0   Blood Glucose Monitoring Suppl (ONETOUCH VERIO FLEX SYSTEM) w/Device KIT USE AS DIRECTED TO CHECK BLOOD SUGAR FOUR TIMES A DAY 1 kit 0   glucose blood  (ONETOUCH ULTRA) test strip USE TO CHECK BLOOD SUGAR FOUR TIMES A DAY AS DIRECTED 400 strip 1   hydrochlorothiazide (HYDRODIURIL) 25 MG tablet TAKE ONE TABLET BY MOUTH DAILY 90 tablet 1   Insulin Glargine (LANTUS SOLOSTAR) 100 UNIT/ML Solostar Pen 30 u sq qpm 15 mL 0   Insulin Pen Needle (NOVOTWIST) 32G X 5 MM MISC Use daily at Bedtime with Lantus 100 each 0   levothyroxine (SYNTHROID) 75 MCG tablet levothyroxine 75 mcg tablet     meloxicam (MOBIC) 15 MG tablet TAKE ONE TABLET BY MOUTH DAILY 90 tablet 0   metFORMIN (GLUCOPHAGE-XR) 500 MG 24 hr tablet 2 po qd 180 tablet 3   pantoprazole (PROTONIX) 40 MG tablet Take 1 tablet (40 mg total) by mouth daily. 30 tablet 11   traMADol (ULTRAM) 50 MG tablet Take 1 tablet (50 mg total) by mouth every 8 (eight) hours as needed. 30 tablet 0   valACYclovir (VALTREX) 1000 MG tablet TAKE ONE TABLET BY MOUTH DAILY AS NEEDED 30 tablet 0   albuterol (VENTOLIN HFA) 108 (90 Base) MCG/ACT inhaler Inhale 2 puffs into the lungs every 6 (six) hours as needed for wheezing or shortness of breath. 8 g 2   azelastine (ASTELIN) 0.1 % nasal spray Place 2 sprays into both nostrils 2 (two) times daily. (Patient not taking: Reported on 04/17/2021) 30 mL 6   conjugated estrogens (PREMARIN) vaginal cream Place 1 Applicatorful vaginally daily. (Patient not taking: Reported on 04/17/2021) 42.5 g 12   FLUoxetine (PROZAC) 20 MG capsule Take 1 capsule (20 mg total) by mouth daily. (Patient not taking: Reported on 04/17/2021) 90 capsule 3   TRULICITY 2.22 LN/9.8XQ SOPN Inject into the skin.     Current Facility-Administered Medications  Medication Dose Route Frequency Provider Last Rate Last Admin   0.9 %  sodium chloride infusion  500 mL Intravenous Once Gatha Mayer, MD       0.9 %  sodium chloride infusion  500 mL Intravenous Continuous Gatha Mayer, MD       ipratropium-albuterol (DUONEB) 0.5-2.5 (3) MG/3ML nebulizer solution 3 mL  3 mL Nebulization Q6H Roma Schanz R, DO         Allergies as of 04/17/2021 - Review Complete 04/17/2021  Allergen Reaction Noted   Crestor [rosuvastatin calcium] Other (See Comments) 08/02/2018   Hydrocodone-acetaminophen Other (See Comments) 10/24/2018    Family History  Problem Relation Age of Onset   Stroke Mother    Alcohol abuse Mother    Stroke Maternal Grandmother    Heart disease Maternal Grandfather    Breast cancer Cousin    Breast cancer Daughter    Colon cancer Neg Hx  Esophageal cancer Neg Hx    Rectal cancer Neg Hx    Stomach cancer Neg Hx     Social History   Socioeconomic History   Marital status: Married    Spouse name: Not on file   Number of children: Not on file   Years of education: Not on file   Highest education level: Not on file  Occupational History   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  Physical Exam: Vital signs BP (!) 141/87    Pulse 97    Temp (!) 97 F (36.1 C)    Resp 19    Ht $R'4\' 11"'hM$  (1.499 m)    Wt 128 lb (58.1 kg)    SpO2 96%    BMI 25.85 kg/m   General:   Alert,  Well-developed, well-nourished, pleasant and cooperative in NAD Lungs:  Clear throughout to auscultation.   Heart:  Regular rate and rhythm; no murmurs, clicks, rubs,  or gallops. Abdomen:  Soft, nontender and nondistended. Normal bowel sounds.   Neuro/Psych:  Alert and cooperative. Normal mood and affect. A and O x 3   '@Neri Vieyra'$  Simonne Maffucci, MD,  [facility] [phone] (pager) 04/17/2021 10:13 AM@

## 2021-04-17 NOTE — Progress Notes (Signed)
Report given to PACU, vss 

## 2021-04-17 NOTE — Patient Instructions (Addendum)
There was a narrow area where esophagus meets stomach (stricture). I dilated it./ Hope this helps you swallow better.  Stomach was mildly irritated or inflamed - called gastritis - biopsied.  Colon looks ok there was a tiny polyp removed and diverticulosis. I took biopsies of the colon lining to try to understand and treat diarrhea.  I appreciate the opportunity to care for you. Gatha Mayer, MD, [facility]   Dilation diet, hiatal hernia, gastritis, polyp and diverticulosis handouts given to patient.  Continue present medications. Await pathology results.  YOU HAD AN ENDOSCOPIC PROCEDURE TODAY AT [facility]:   Refer to the procedure report that was given to you for any specific questions about what was found during the examination.  If the procedure report does not answer your questions, please call your gastroenterologist to clarify.  If you requested that your care partner not be given the details of your procedure findings, then the procedure report has been included in a sealed envelope for you to review at your convenience later.  YOU SHOULD EXPECT: Some feelings of bloating in the abdomen. Passage of more gas than usual.  Walking can help get rid of the air that was put into your GI tract during the procedure and reduce the bloating. If you had a lower endoscopy (such as a colonoscopy or flexible sigmoidoscopy) you may notice spotting of blood in your stool or on the toilet paper. If you underwent a bowel prep for your procedure, you may not have a normal bowel movement for a few days.  Please Note:  You might notice some irritation and congestion in your nose or some drainage.  This is from the oxygen used during your procedure.  There is no need for concern and it should clear up in a day or so.  SYMPTOMS TO REPORT IMMEDIATELY:  Following lower endoscopy (colonoscopy or flexible sigmoidoscopy):  Excessive amounts of blood in the stool  Significant tenderness or  worsening of abdominal pains  Swelling of the abdomen that is new, acute  Fever of 100F or higher  Following upper endoscopy (EGD)  Vomiting of blood or coffee ground material  New chest pain or pain under the shoulder blades  Painful or persistently difficult swallowing  New shortness of breath  Fever of 100F or higher  Black, tarry-looking stools  For urgent or emergent issues, a gastroenterologist can be reached at any hour by calling [phone]. Do not use MyChart messaging for urgent concerns.    DIET:  We do recommend a small meal at first, but then you may proceed to your regular diet.  Drink plenty of fluids but you should avoid alcoholic beverages for 24 hours.  ACTIVITY:  You should plan to take it easy for the rest of today and you should NOT DRIVE or use heavy machinery until tomorrow (because of the sedation medicines used during the test).    FOLLOW UP: Our staff will call the number listed on your records 48-72 hours following your procedure to check on you and address any questions or concerns that you may have regarding the information given to you following your procedure. If we do not reach you, we will leave a message.  We will attempt to reach you two times.  During this call, we will ask if you have developed any symptoms of COVID 19. If you develop any symptoms (ie: fever, flu-like symptoms, shortness of breath, cough etc.) before then, please call [phone].  If you test positive  for Covid 19 in the 2 weeks post procedure, please call and report this information to [place].    If any biopsies were taken you will be contacted by phone or by letter within the next 1-3 weeks.  Please call us at [phone] if you have not heard about the biopsies in 3 weeks.    SIGNATURES/CONFIDENTIALITY: You and/or your care partner have signed paperwork which will be entered into your electronic medical record.  These signatures attest to the fact that that the information  above on your After Visit Summary has been reviewed and is understood.  Full responsibility of the confidentiality of this discharge information lies with you and/or your care-partner.

## 2021-04-21 ENCOUNTER — Telehealth: Payer: Self-pay | Admitting: *Deleted

## 2021-04-21 ENCOUNTER — Other Ambulatory Visit: Payer: Self-pay | Admitting: Family Medicine

## 2021-04-21 ENCOUNTER — Telehealth: Payer: Self-pay

## 2021-04-21 DIAGNOSIS — F419 Anxiety disorder, unspecified: Secondary | ICD-10-CM

## 2021-04-21 MED ORDER — ALPRAZOLAM 0.5 MG PO TABS: 0.5000 mg | ORAL_TABLET | Freq: Three times a day (TID) | ORAL | 0 refills | Status: DC | PRN

## 2021-04-21 NOTE — Telephone Encounter (Signed)
Requesting: alprazolam 0.5mg  Contract: None UDS: None Last Visit: 02/21/2021 Next Visit: None Last Refill: 02/06/2021 #90 and 0RF  Please Advise

## 2021-04-21 NOTE — Telephone Encounter (Signed)
Patient called back to say she was doing just fine.  She will call if she has any issues.

## 2021-04-21 NOTE — Telephone Encounter (Signed)
Left message on follow up call. 

## 2021-04-21 NOTE — Telephone Encounter (Signed)
°  Follow up Call-  Call back number 04/17/2021  Post procedure Call Back phone  # [phone]  Permission to leave phone message Yes  Some recent data might be hidden     Patient questions: Message left to call us if necessary.

## 2021-04-24 DIAGNOSIS — G720 Drug-induced myopathy: Secondary | ICD-10-CM | POA: Diagnosis not present

## 2021-04-24 DIAGNOSIS — E039 Hypothyroidism, unspecified: Secondary | ICD-10-CM | POA: Diagnosis not present

## 2021-04-24 DIAGNOSIS — E1165 Type 2 diabetes mellitus with hyperglycemia: Secondary | ICD-10-CM | POA: Diagnosis not present

## 2021-04-24 DIAGNOSIS — Z794 Long term (current) use of insulin: Secondary | ICD-10-CM | POA: Diagnosis not present

## 2021-04-25 ENCOUNTER — Other Ambulatory Visit: Payer: Self-pay

## 2021-04-25 DIAGNOSIS — R197 Diarrhea, unspecified: Secondary | ICD-10-CM

## 2021-04-25 MED ORDER — CHOLESTYRAMINE 4 G PO PACK: 4.0000 g | PACK | Freq: Every day | ORAL | 3 refills | Status: DC

## 2021-05-02 ENCOUNTER — Ambulatory Visit
Admission: RE | Admit: 2021-05-02 | Discharge: 2021-05-02 | Disposition: A | Discharge status: Home / Self Care | Payer: PPO | Source: Ambulatory Visit | Attending: Orthopaedic Surgery | Admitting: Orthopaedic Surgery

## 2021-05-02 DIAGNOSIS — M4726 Other spondylosis with radiculopathy, lumbar region: Secondary | ICD-10-CM

## 2021-05-02 DIAGNOSIS — M545 Low back pain, unspecified: Secondary | ICD-10-CM | POA: Diagnosis not present

## 2021-05-02 DIAGNOSIS — M5416 Radiculopathy, lumbar region: Secondary | ICD-10-CM

## 2021-05-02 DIAGNOSIS — M4326 Fusion of spine, lumbar region: Secondary | ICD-10-CM | POA: Diagnosis not present

## 2021-05-02 DIAGNOSIS — M48061 Spinal stenosis, lumbar region without neurogenic claudication: Secondary | ICD-10-CM | POA: Diagnosis not present

## 2021-05-02 DIAGNOSIS — M4328 Fusion of spine, sacral and sacrococcygeal region: Secondary | ICD-10-CM | POA: Diagnosis not present

## 2021-05-05 DIAGNOSIS — M4326 Fusion of spine, lumbar region: Secondary | ICD-10-CM | POA: Diagnosis not present

## 2021-05-05 DIAGNOSIS — M47896 Other spondylosis, lumbar region: Secondary | ICD-10-CM | POA: Diagnosis not present

## 2021-05-05 DIAGNOSIS — Z6827 Body mass index (BMI) 27.0-27.9, adult: Secondary | ICD-10-CM | POA: Diagnosis not present

## 2021-05-08 ENCOUNTER — Ambulatory Visit (INDEPENDENT_AMBULATORY_CARE_PROVIDER_SITE_OTHER): Payer: PPO | Admitting: Family Medicine

## 2021-05-08 ENCOUNTER — Encounter: Payer: Self-pay | Admitting: Family Medicine

## 2021-05-08 VITALS — BP 110/78 | HR 107 | Temp 98.1°F | Resp 18 | Ht 59.0 in | Wt 124.4 lb

## 2021-05-08 DIAGNOSIS — R937 Abnormal findings on diagnostic imaging of other parts of musculoskeletal system: Secondary | ICD-10-CM

## 2021-05-08 LAB — COMPREHENSIVE METABOLIC PANEL
AG Ratio: 1.3 (calc) (ref 1.0–2.5)
ALT: 13 U/L (ref 6–29)
AST: 15 U/L (ref 10–35)
Albumin: 4.5 g/dL (ref 3.6–5.1)
Alkaline phosphatase (APISO): 75 U/L (ref 37–153)
BUN: 19 mg/dL (ref 7–25)
CO2: 30 mmol/L (ref 20–32)
Calcium: 10.5 mg/dL — ABNORMAL HIGH (ref 8.6–10.4)
Chloride: 95 mmol/L — ABNORMAL LOW (ref 98–110)
Creat: 0.88 mg/dL (ref 0.60–1.00)
Globulin: 3.4 g/dL (calc) (ref 1.9–3.7)
Glucose, Bld: 172 mg/dL — ABNORMAL HIGH (ref 65–99)
Potassium: 4.5 mmol/L (ref 3.5–5.3)
Sodium: 137 mmol/L (ref 135–146)
Total Bilirubin: 0.5 mg/dL (ref 0.2–1.2)
Total Protein: 7.9 g/dL (ref 6.1–8.1)

## 2021-05-08 NOTE — Progress Notes (Signed)
Established Patient Office Visit  Subjective:  Patient ID: [name], female    DOB: 1951-04-04  Age: 71 y.o. MRN: [id number]  CC:  Chief Complaint  Patient presents with   Back Pain   Follow-up    HPI [name] presents for f/u low back pain.   She had an MRI with Dr Patrice Paradise and it showed possible metastatic lesions T10, L1, L2 Pt is having severe low back pain and is taking tramadol.  Her husband is with her   Past Medical History:  Diagnosis Date   Arthritis    Diabetes mellitus    Esophagitis    Hiatal hernia    Hx of colonic polyps ssp and adenoma 03/06/2018   OSA on CPAP    Sleep apnea    Thyroid disease    TMJ (sprain of temporomandibular joint)     Past Surgical History:  Procedure Laterality Date   ABDOMINAL HYSTERECTOMY     BACK SURGERY     x3   BACK SURGERY     x 5 , 2 neck surgeries, 2 lower back surgeries   CHOLECYSTECTOMY     COLONOSCOPY     ERCP     ESOPHAGOGASTRODUODENOSCOPY     THYROID SURGERY     TONSILECTOMY, ADENOIDECTOMY, BILATERAL MYRINGOTOMY AND TUBES      Family History  Problem Relation Age of Onset   Stroke Mother    Alcohol abuse Mother    Stroke Maternal Grandmother    Heart disease Maternal Grandfather    Breast cancer Cousin    Breast cancer Daughter    Colon cancer Neg Hx    Esophageal cancer Neg Hx    Rectal cancer Neg Hx    Stomach cancer Neg Hx     Social History   Socioeconomic History   Marital status: Married    Spouse name: Not on file   Number of children: Not on file   Years of education: Not on file   Highest education level: Not on file  Occupational History   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    Employer: [employer]  Tobacco Use   Smoking status: Former    Years: 48.00    Types: Cigarettes    Quit date: 02/01/2017    Years since quitting: 4.2   Smokeless tobacco: Never  Vaping Use   Vaping Use: Never used  Substance and Sexual Activity   Alcohol use: No    Alcohol/week: 0.0 standard  drinks   Drug use: No   Sexual activity: Yes    Partners: Male  Other Topics Concern   Not on file  Social History Narrative   Exercise--  3 flts steps a day for 8 hours   Social Determinants of Health   Financial Resource Strain: Not on file  Food Insecurity: Not on file  Transportation Needs: Not on file  Physical Activity: Not on file  Stress: Not on file  Social Connections: Not on file  Intimate Partner Violence: Not on file    Outpatient Medications Prior to Visit  Medication Sig Dispense Refill   albuterol (VENTOLIN HFA) 108 (90 Base) MCG/ACT inhaler Inhale 2 puffs into the lungs every 6 (six) hours as needed for wheezing or shortness of breath. 8 g 2   ALPRAZolam (XANAX) 0.5 MG tablet Take 1 tablet (0.5 mg total) by mouth 3 (three) times daily as needed. 90 tablet 0   Blood Glucose Monitoring Suppl (ONE TOUCH ULTRA 2) w/Device KIT Check blood  sugar as directed. Dx:E11.65 1 kit 0   Blood Glucose Monitoring Suppl (ONETOUCH VERIO FLEX SYSTEM) w/Device KIT USE AS DIRECTED TO CHECK BLOOD SUGAR FOUR TIMES A DAY 1 kit 0   cholestyramine (QUESTRAN) 4 g packet Take 1 packet (4 g total) by mouth daily. Take this with your evening meal: 90 each 3   glucose blood (ONETOUCH ULTRA) test strip USE TO CHECK BLOOD SUGAR FOUR TIMES A DAY AS DIRECTED 400 strip 1   hydrochlorothiazide (HYDRODIURIL) 25 MG tablet TAKE ONE TABLET BY MOUTH DAILY 90 tablet 1   Insulin Glargine (LANTUS SOLOSTAR) 100 UNIT/ML Solostar Pen 30 u sq qpm 15 mL 0   Insulin Pen Needle (NOVOTWIST) 32G X 5 MM MISC Use daily at Bedtime with Lantus 100 each 0   levothyroxine (SYNTHROID) 75 MCG tablet levothyroxine 75 mcg tablet     meloxicam (MOBIC) 15 MG tablet TAKE ONE TABLET BY MOUTH DAILY 90 tablet 0   metFORMIN (GLUCOPHAGE-XR) 500 MG 24 hr tablet 2 po qd 180 tablet 3   pantoprazole (PROTONIX) 40 MG tablet Take 1 tablet (40 mg total) by mouth daily. 30 tablet 11   traMADol (ULTRAM) 50 MG tablet Take 1 tablet (50 mg total)  by mouth every 8 (eight) hours as needed. 30 tablet 0   TRULICITY 8.24 MP/5.3IR SOPN Inject into the skin.     valACYclovir (VALTREX) 1000 MG tablet TAKE ONE TABLET BY MOUTH DAILY AS NEEDED 30 tablet 0   azelastine (ASTELIN) 0.1 % nasal spray Place 2 sprays into both nostrils 2 (two) times daily. (Patient not taking: Reported on 04/17/2021) 30 mL 6   conjugated estrogens (PREMARIN) vaginal cream Place 1 Applicatorful vaginally daily. (Patient not taking: Reported on 04/17/2021) 42.5 g 12   FLUoxetine (PROZAC) 20 MG capsule Take 1 capsule (20 mg total) by mouth daily. (Patient not taking: Reported on 04/17/2021) 90 capsule 3   Facility-Administered Medications Prior to Visit  Medication Dose Route Frequency Provider Last Rate Last Admin   0.9 %  sodium chloride infusion  500 mL Intravenous Continuous Gatha Mayer, MD       ipratropium-albuterol (DUONEB) 0.5-2.5 (3) MG/3ML nebulizer solution 3 mL  3 mL Nebulization Q6H Lowne Chase, Yvonne R, DO        Allergies  Allergen Reactions   Crestor [Rosuvastatin Calcium] Other (See Comments)    Leg cramps   Hydrocodone-Acetaminophen Other (See Comments)    Nightmares, skin crawling    ROS Review of Systems  Constitutional:  Negative for fever.  HENT:  Negative for congestion.   Respiratory:  Negative for cough.   Cardiovascular:  Negative for chest pain and palpitations.  Gastrointestinal:  Negative for vomiting.  Musculoskeletal:  Positive for back pain.  Skin:  Negative for rash.  Neurological:  Negative for headaches.     Objective:    Physical Exam Vitals and nursing note reviewed.  Constitutional:      Appearance: She is well-developed.  HENT:     Head: Normocephalic and atraumatic.  Eyes:     Conjunctiva/sclera: Conjunctivae normal.  Neck:     Thyroid: No thyromegaly.     Vascular: No carotid bruit or JVD.  Cardiovascular:     Rate and Rhythm: Normal rate and regular rhythm.     Heart sounds: Normal heart sounds. No murmur  heard. Pulmonary:     Effort: Pulmonary effort is normal. No respiratory distress.     Breath sounds: Normal breath sounds. No wheezing or rales.  Chest:  Chest wall: No tenderness.  Musculoskeletal:     Cervical back: Normal range of motion and neck supple.  Neurological:     Mental Status: She is alert and oriented to person, place, and time.    BP 110/78 (BP Location: Right Arm, Patient Position: Sitting, Cuff Size: Normal)    Pulse (!) 107    Temp 98.1 F (36.7 C) (Oral)    Resp 18    Ht 4' 11" (1.499 m)    Wt 124 lb 6.4 oz (56.4 kg)    SpO2 95%    BMI 25.13 kg/m  Wt Readings from Last 3 Encounters:  05/08/21 124 lb 6.4 oz (56.4 kg)  04/17/21 128 lb (58.1 kg)  02/21/21 130 lb 9.6 oz (59.2 kg)     Health Maintenance Due  Topic Date Due   Zoster Vaccines- Shingrix (1 of 2) Never done   TETANUS/TDAP  01/23/2017   OPHTHALMOLOGY EXAM  01/28/2017   Pneumonia Vaccine 9+ Years old (19 - PPSV23 if available, else PCV20) 02/20/2019   COVID-19 Vaccine (2 - Janssen risk series) 10/02/2020    There are no preventive care reminders to display for this patient.  Lab Results  Component Value Date   TSH 0.57 02/21/2021   Lab Results  Component Value Date   WBC 7.8 02/21/2021   HGB 13.2 02/21/2021   HCT 40.1 02/21/2021   MCV 90.4 02/21/2021   PLT 183.0 02/21/2021   Lab Results  Component Value Date   NA 136 02/21/2021   K 4.3 02/21/2021   CO2 27 02/21/2021   GLUCOSE 209 (H) 02/21/2021   BUN 21 02/21/2021   CREATININE 0.85 02/21/2021   BILITOT 0.5 02/21/2021   ALKPHOS 76 02/21/2021   AST 17 02/21/2021   ALT 13 02/21/2021   PROT 7.4 02/21/2021   ALBUMIN 4.1 02/21/2021   CALCIUM 9.5 02/21/2021   ANIONGAP 10 10/16/2018   GFR 69.31 02/21/2021   Lab Results  Component Value Date   CHOL 182 02/21/2021   Lab Results  Component Value Date   HDL 46.10 02/21/2021   Lab Results  Component Value Date   LDLCALC 102 (H) 02/21/2021   Lab Results  Component Value Date    TRIG 169.0 (H) 02/21/2021   Lab Results  Component Value Date   CHOLHDL 4 02/21/2021   Lab Results  Component Value Date   HGBA1C 11.3 Repeated and verified X2. (H) 02/21/2021      Assessment & Plan:   Problem List Items Addressed This Visit       Unprioritized   Abnormal MRI, spine - Primary    Order ct abd , pelvis and chest  Diagnostic mammogram        Relevant Orders   MM Digital Diagnostic Bilat   US BREAST LTD UNI RIGHT INC AXILLA   US BREAST COMPLETE UNI LEFT INC AXILLA   CT Abdomen Pelvis W Contrast   CT Chest W Contrast   Comprehensive metabolic panel    No orders of the defined types were placed in this encounter.   Follow-up: Return if symptoms worsen or fail to improve.    [name], DO

## 2021-05-08 NOTE — Assessment & Plan Note (Signed)
Order ct abd , pelvis and chest  Diagnostic mammogram

## 2021-05-14 ENCOUNTER — Telehealth: Payer: Self-pay

## 2021-05-14 ENCOUNTER — Other Ambulatory Visit: Payer: Self-pay

## 2021-05-14 ENCOUNTER — Ambulatory Visit (HOSPITAL_BASED_OUTPATIENT_CLINIC_OR_DEPARTMENT_OTHER)
Admission: RE | Admit: 2021-05-14 | Discharge: 2021-05-14 | Disposition: A | Discharge status: Home / Self Care | Payer: PPO | Source: Ambulatory Visit | Attending: Family Medicine | Admitting: Family Medicine

## 2021-05-14 ENCOUNTER — Encounter (HOSPITAL_BASED_OUTPATIENT_CLINIC_OR_DEPARTMENT_OTHER): Payer: Self-pay

## 2021-05-14 ENCOUNTER — Encounter: Payer: Self-pay | Admitting: *Deleted

## 2021-05-14 DIAGNOSIS — I700 Atherosclerosis of aorta: Secondary | ICD-10-CM | POA: Diagnosis not present

## 2021-05-14 DIAGNOSIS — D7389 Other diseases of spleen: Secondary | ICD-10-CM | POA: Diagnosis not present

## 2021-05-14 DIAGNOSIS — R937 Abnormal findings on diagnostic imaging of other parts of musculoskeletal system: Secondary | ICD-10-CM | POA: Diagnosis not present

## 2021-05-14 DIAGNOSIS — C7951 Secondary malignant neoplasm of bone: Secondary | ICD-10-CM | POA: Diagnosis not present

## 2021-05-14 DIAGNOSIS — K449 Diaphragmatic hernia without obstruction or gangrene: Secondary | ICD-10-CM | POA: Diagnosis not present

## 2021-05-14 DIAGNOSIS — M8448XA Pathological fracture, other site, initial encounter for fracture: Secondary | ICD-10-CM | POA: Diagnosis not present

## 2021-05-14 DIAGNOSIS — J929 Pleural plaque without asbestos: Secondary | ICD-10-CM | POA: Diagnosis not present

## 2021-05-14 DIAGNOSIS — K5730 Diverticulosis of large intestine without perforation or abscess without bleeding: Secondary | ICD-10-CM | POA: Diagnosis not present

## 2021-05-14 DIAGNOSIS — R918 Other nonspecific abnormal finding of lung field: Secondary | ICD-10-CM

## 2021-05-14 MED ORDER — IOHEXOL 300 MG/ML  SOLN
100.0000 mL | Freq: Once | INTRAMUSCULAR | Status: AC | PRN
  Administered 2021-05-14: 100 mL via INTRAVENOUS

## 2021-05-14 NOTE — Telephone Encounter (Signed)
Referrals placed 

## 2021-05-14 NOTE — Telephone Encounter (Signed)
Received call from [facility] at [facility] Radiology regarding CT reports below. Radiologist wanted provider to be aware of below- most importantly #3.   IMPRESSION: 1. LEFT perihilar masslike area with extensive mediastinal adenopathy most suggestive of thoracic neoplasm likely lung cancer. Would also consider the possibility of esophageal neoplasm given distal esophageal thickening. 2. Signs of pleural disease in the LEFT chest with pleural nodularity overlying the LEFT hemidiaphragm. No current evidence of effusion. 3. 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 Lumbar imaging with MRI showed metastatic disease in the lumbar spine to better advantage. T10 vertebral metastasis that was partially imaged on the prior MRI does extend into the central canal and destroys posterior aspect of LEFT pedicle and vertebral body. Other thoracic and lumbar metastatic lesions as well as LEFT acetabular lesion as outlined above. Dedicated thoracic spine imaging may be helpful given above findings if not yet performed. 4. Hypodense lesion in the cephalad spleen, enlarged since previous imaging, and is indeterminate. Density does not allow for characterization as a benign process and enlargement in the current context is suspicious. Could consider close attention on follow-up or abdominal MRI as warranted for further evaluation. 5. Signs of hepatic steatosis. 6. Colonic diverticulosis without evidence of acute diverticulitis. 7. Aortic atherosclerosis.

## 2021-05-14 NOTE — Progress Notes (Signed)
Reached out to [employer] to introduce myself as the office RN Navigator and explain our new patient process. Reviewed the reason for their referral and scheduled their new patient appointment along with labs. Provided address and directions to the office including call back phone number. Reviewed with patient any concerns they may have or any possible barriers to attending their appointment.   Informed patient about my role as a navigator and that I will meet with them prior to their New Patient appointment and more fully discuss what services I can provide. At this time patient has no further questions or needs.    Oncology Nurse Navigator Documentation  Oncology Nurse Navigator Flowsheets 05/14/2021  Abnormal Finding Date 05/05/2021  Diagnosis Status Additional Work Up  Navigator Follow Up Date: 05/16/2021  Navigator Follow Up Reason: New Patient Appointment  Navigator Location [facility]  Referral Date to RadOnc/MedOnc 05/14/2021  Navigator Encounter Type Introductory Phone Call  Patient Visit Type MedOnc  Treatment Phase Abnormal Scans  Barriers/Navigation Needs Coordination of Care;Education  Education Other  Interventions Coordination of Care;Education  Acuity Level 2-Minimal Needs (1-2 Barriers Identified)  Coordination of Care Appts  Education Method Verbal;Teach-back  Support Groups/Services Friends and Family  Time Spent with Patient 28

## 2021-05-14 NOTE — Telephone Encounter (Signed)
Spoke with the patient and her husband, they knew that cancer was in the differential diagnosis. CT confirms that this is most likely lung cancer. She currently denies any paresthesias or motor deficits.  No bladder or bowel incontinence.  Recommend to reach out for help with any of the above happen.  Plan: Arrange urgent referral to pulmonary and oncology.  Dx rule out lung cancer with spine mets.

## 2021-05-15 ENCOUNTER — Encounter: Payer: Self-pay | Admitting: *Deleted

## 2021-05-15 ENCOUNTER — Encounter: Payer: Self-pay | Admitting: Internal Medicine

## 2021-05-15 ENCOUNTER — Telehealth (INDEPENDENT_AMBULATORY_CARE_PROVIDER_SITE_OTHER): Payer: PPO | Admitting: Internal Medicine

## 2021-05-15 VITALS — BP 134/85 | HR 96 | Ht 59.0 in | Wt 122.0 lb

## 2021-05-15 DIAGNOSIS — U071 COVID-19: Secondary | ICD-10-CM | POA: Diagnosis not present

## 2021-05-15 DIAGNOSIS — R918 Other nonspecific abnormal finding of lung field: Secondary | ICD-10-CM

## 2021-05-15 MED ORDER — MOLNUPIRAVIR EUA 200MG CAPSULE: 4.0000 | ORAL_CAPSULE | Freq: Two times a day (BID) | ORAL | 0 refills | Status: AC

## 2021-05-15 NOTE — Progress Notes (Signed)
Received a message from the patient's husband, Vicente Serene, that patient tested positive for COVID on 05/14/2021. She has a telemedicine appointment with her PCP today. He wants to know how this will affect the new patient appointment.   Patient will need to be rescheduled to 05/23/2021. Spoke to Dr Marin Olp. While we wait for her appointment, he will go ahead and order a PET and pulmonary medicine consult.   Spoke to [place]. He is aware of the new appointment time. He is also aware of the referrals being placed to start to the workup process.   Oncology Nurse Navigator Documentation  Oncology Nurse Navigator Flowsheets 05/15/2021  Abnormal Finding Date -  Diagnosis Status -  Navigator Follow Up Date: 05/23/2021  Navigator Follow Up Reason: New Patient Appointment  Navigator Location [facility]  Referral Date to RadOnc/MedOnc 05/14/2021  Navigator Encounter Type Telephone  Telephone Patient Update;Appt Confirmation/Clarification;Education;Incoming Call  Patient Visit Type MedOnc  Treatment Phase Abnormal Scans  Barriers/Navigation Needs Coordination of Care;Education  Education Other  Interventions Coordination of Care;Education;Referrals  Acuity Level 2-Minimal Needs (1-2 Barriers Identified)  Referrals Pulmonary  Coordination of Care Appts;Radiology  Education Method Verbal  Support Groups/Services Friends and Family  Time Spent with Patient 81

## 2021-05-15 NOTE — Progress Notes (Signed)
Subjective:    Patient ID: [name], female    DOB: 22-Nov-1950, 71 y.o.   MRN: [id number]  DOS:  05/15/2021 Type of visit - description: Virtual Visit via Telephone    I connected with above mentioned patient  by telephone and verified that I am speaking with the correct person using two identifiers.  THIS ENCOUNTER IS A VIRTUAL VISIT DUE TO COVID-19 - PATIENT WAS NOT SEEN IN THE OFFICE. PATIENT HAS CONSENTED TO VIRTUAL VISIT / TELEMEDICINE VISIT   Location of patient: home  Location of provider: office  Persons participating in the virtual visit: patient, provider   I discussed the limitations, risks, security and privacy concerns of performing an evaluation and management service by telephone and the availability of in person appointments. I also discussed with the patient that there may be a patient responsible charge related to this service. The patient expressed understanding and agreed to proceed.  Acute: She has chronic cough, for the last 2 days cough has been much more intense with yellowish sputum production. Her husband is COVID-positive so she decided to test and her test came back positive as well. Denies any fever chills No nausea or vomiting No unusual aches or pains. Did admit to runny nose and mild sore throat. Denies wheezing or difficulty breathing.   Review of Systems See above   Past Medical History:  Diagnosis Date   Arthritis    Diabetes mellitus    Esophagitis    Hiatal hernia    Hx of colonic polyps ssp and adenoma 03/06/2018   OSA on CPAP    Sleep apnea    Thyroid disease    TMJ (sprain of temporomandibular joint)     Past Surgical History:  Procedure Laterality Date   ABDOMINAL HYSTERECTOMY     BACK SURGERY     x3   BACK SURGERY     x 5 , 2 neck surgeries, 2 lower back surgeries   CHOLECYSTECTOMY     COLONOSCOPY     ERCP     ESOPHAGOGASTRODUODENOSCOPY     THYROID SURGERY     TONSILECTOMY, ADENOIDECTOMY, BILATERAL MYRINGOTOMY AND  TUBES      Current Outpatient Medications  Medication Instructions   albuterol (VENTOLIN HFA) 108 (90 Base) MCG/ACT inhaler 2 puffs, Inhalation, Every 6 hours PRN   ALPRAZolam (XANAX) 0.5 mg, Oral, 3 times daily PRN   azelastine (ASTELIN) 0.1 % nasal spray 2 sprays, Each Nare, 2 times daily   Blood Glucose Monitoring Suppl (ONE TOUCH ULTRA 2) w/Device KIT Check blood sugar as directed. Dx:E11.65   Blood Glucose Monitoring Suppl (ONETOUCH VERIO FLEX SYSTEM) w/Device KIT USE AS DIRECTED TO CHECK BLOOD SUGAR FOUR TIMES A DAY   cholestyramine (QUESTRAN) 4 g, Oral, Daily, Take this with your evening meal:   conjugated estrogens (PREMARIN) vaginal cream 1 Applicatorful, Vaginal, Daily   FLUoxetine (PROZAC) 20 mg, Oral, Daily   glucose blood (ONETOUCH ULTRA) test strip USE TO CHECK BLOOD SUGAR FOUR TIMES A DAY AS DIRECTED   hydrochlorothiazide (HYDRODIURIL) 25 MG tablet TAKE ONE TABLET BY MOUTH DAILY   Insulin Glargine (LANTUS SOLOSTAR) 100 UNIT/ML Solostar Pen 30 u sq qpm   Insulin Pen Needle (NOVOTWIST) 32G X 5 MM MISC Use daily at Bedtime with Lantus   levothyroxine (SYNTHROID) 75 MCG tablet levothyroxine 75 mcg tablet   meloxicam (MOBIC) 15 MG tablet TAKE ONE TABLET BY MOUTH DAILY   metFORMIN (GLUCOPHAGE-XR) 500 MG 24 hr tablet 2 po qd   pantoprazole (  PROTONIX) 40 mg, Oral, Daily   traMADol (ULTRAM) 50 mg, Oral, Every 8 hours PRN   TRULICITY 5.18 ZF/5.8IP SOPN Subcutaneous   valACYclovir (VALTREX) 1000 MG tablet TAKE ONE TABLET BY MOUTH DAILY AS NEEDED       Objective:   Physical Exam Ht _0  (1.499 m)    Wt 122 lb (55.3 kg)    BMI 24.64 kg/m  This is a telephone assessment, she sounded well, speaking in complete sentences, no wheezing, cough or chest congestion noted    Assessment     71 year old female, PMH includes diabetes, high cholesterol, OSA, thyroid disease, former smoker, lung cancer suspected, oncology eval pending status post COVID vaccines x2, presents  with:  COVID-19: As described above, main symptom is increased of chronic cough for the last 2 days. Explained the benefits of antivirals. Plan: Molnupiravir Rest, fluids, inhalers if wheezing, Robitussin-DM. Isolate for 7 to 10 days Call if not gradually better.  Seek medical attention if worse   I discussed the assessment and treatment plan with the patient. The patient was provided an opportunity to ask questions and all were answered. The patient agreed with the plan and demonstrated an understanding of the instructions.   The patient was advised to call back or seek an in-person evaluation if the symptoms worsen or if the condition fails to improve as anticipated.  I provided 18 minutes of non-face-to-face time during this encounter.  Kathlene November, MD

## 2021-05-16 ENCOUNTER — Other Ambulatory Visit: Payer: PPO

## 2021-05-16 ENCOUNTER — Ambulatory Visit: Payer: PPO | Admitting: Hematology & Oncology

## 2021-05-19 ENCOUNTER — Other Ambulatory Visit: Payer: Self-pay | Admitting: Family Medicine

## 2021-05-19 ENCOUNTER — Encounter: Payer: Self-pay | Admitting: Family Medicine

## 2021-05-19 DIAGNOSIS — E1165 Type 2 diabetes mellitus with hyperglycemia: Secondary | ICD-10-CM

## 2021-05-19 MED ORDER — BLOOD GLUCOSE MONITOR KIT: PACK | 0 refills | Status: DC

## 2021-05-19 NOTE — Progress Notes (Signed)
Noted  

## 2021-05-23 ENCOUNTER — Encounter: Payer: Self-pay | Admitting: Hematology & Oncology

## 2021-05-23 ENCOUNTER — Other Ambulatory Visit: Payer: Self-pay

## 2021-05-23 ENCOUNTER — Encounter: Payer: Self-pay | Admitting: *Deleted

## 2021-05-23 ENCOUNTER — Inpatient Hospital Stay: Payer: PPO | Attending: Hematology & Oncology

## 2021-05-23 ENCOUNTER — Inpatient Hospital Stay: Payer: PPO | Admitting: Hematology & Oncology

## 2021-05-23 VITALS — BP 126/88 | HR 86 | Temp 98.4°F | Resp 16 | Wt 120.0 lb

## 2021-05-23 DIAGNOSIS — Z8249 Family history of ischemic heart disease and other diseases of the circulatory system: Secondary | ICD-10-CM | POA: Diagnosis not present

## 2021-05-23 DIAGNOSIS — Z885 Allergy status to narcotic agent status: Secondary | ICD-10-CM | POA: Diagnosis not present

## 2021-05-23 DIAGNOSIS — E119 Type 2 diabetes mellitus without complications: Secondary | ICD-10-CM

## 2021-05-23 DIAGNOSIS — Z79899 Other long term (current) drug therapy: Secondary | ICD-10-CM | POA: Insufficient documentation

## 2021-05-23 DIAGNOSIS — C3490 Malignant neoplasm of unspecified part of unspecified bronchus or lung: Secondary | ICD-10-CM

## 2021-05-23 DIAGNOSIS — R634 Abnormal weight loss: Secondary | ICD-10-CM

## 2021-05-23 DIAGNOSIS — Z823 Family history of stroke: Secondary | ICD-10-CM | POA: Diagnosis not present

## 2021-05-23 DIAGNOSIS — R918 Other nonspecific abnormal finding of lung field: Secondary | ICD-10-CM

## 2021-05-23 DIAGNOSIS — C3400 Malignant neoplasm of unspecified main bronchus: Secondary | ICD-10-CM

## 2021-05-23 DIAGNOSIS — Z87891 Personal history of nicotine dependence: Secondary | ICD-10-CM | POA: Insufficient documentation

## 2021-05-23 DIAGNOSIS — Z9049 Acquired absence of other specified parts of digestive tract: Secondary | ICD-10-CM

## 2021-05-23 DIAGNOSIS — Z803 Family history of malignant neoplasm of breast: Secondary | ICD-10-CM

## 2021-05-23 DIAGNOSIS — Z8719 Personal history of other diseases of the digestive system: Secondary | ICD-10-CM

## 2021-05-23 DIAGNOSIS — R5383 Other fatigue: Secondary | ICD-10-CM | POA: Insufficient documentation

## 2021-05-23 DIAGNOSIS — M549 Dorsalgia, unspecified: Secondary | ICD-10-CM

## 2021-05-23 DIAGNOSIS — Z811 Family history of alcohol abuse and dependence: Secondary | ICD-10-CM | POA: Insufficient documentation

## 2021-05-23 DIAGNOSIS — C7951 Secondary malignant neoplasm of bone: Secondary | ICD-10-CM | POA: Insufficient documentation

## 2021-05-23 DIAGNOSIS — Z7189 Other specified counseling: Secondary | ICD-10-CM | POA: Insufficient documentation

## 2021-05-23 HISTORY — DX: Other specified counseling: Z71.89

## 2021-05-23 LAB — CBC WITH DIFFERENTIAL (CANCER CENTER ONLY)
Abs Immature Granulocytes: 0.05 10*3/uL (ref 0.00–0.07)
Basophils Absolute: 0 10*3/uL (ref 0.0–0.1)
Basophils Relative: 0 %
Eosinophils Absolute: 0.2 10*3/uL (ref 0.0–0.5)
Eosinophils Relative: 2 %
HCT: 41 % (ref 36.0–46.0)
Hemoglobin: 13.5 g/dL (ref 12.0–15.0)
Immature Granulocytes: 1 %
Lymphocytes Relative: 25 %
Lymphs Abs: 1.8 10*3/uL (ref 0.7–4.0)
MCH: 30.2 pg (ref 26.0–34.0)
MCHC: 32.9 g/dL (ref 30.0–36.0)
MCV: 91.7 fL (ref 80.0–100.0)
Monocytes Absolute: 0.6 10*3/uL (ref 0.1–1.0)
Monocytes Relative: 9 %
Neutro Abs: 4.5 10*3/uL (ref 1.7–7.7)
Neutrophils Relative %: 63 %
Platelet Count: 270 10*3/uL (ref 150–400)
RBC: 4.47 MIL/uL (ref 3.87–5.11)
RDW: 12.3 % (ref 11.5–15.5)
WBC Count: 7.1 10*3/uL (ref 4.0–10.5)
nRBC: 0 % (ref 0.0–0.2)

## 2021-05-23 LAB — CMP (CANCER CENTER ONLY)
ALT: 11 U/L (ref 0–44)
AST: 14 U/L — ABNORMAL LOW (ref 15–41)
Albumin: 3.9 g/dL (ref 3.5–5.0)
Alkaline Phosphatase: 70 U/L (ref 38–126)
Anion gap: 9 (ref 5–15)
BUN: 14 mg/dL (ref 8–23)
CO2: 32 mmol/L (ref 22–32)
Calcium: 10.4 mg/dL — ABNORMAL HIGH (ref 8.9–10.3)
Chloride: 96 mmol/L — ABNORMAL LOW (ref 98–111)
Creatinine: 0.75 mg/dL (ref 0.44–1.00)
GFR, Estimated: >60 mL/min (ref >60)
Glucose, Bld: 100 mg/dL — ABNORMAL HIGH (ref 70–99)
Potassium: 3.8 mmol/L (ref 3.5–5.1)
Sodium: 137 mmol/L (ref 135–145)
Total Bilirubin: 0.4 mg/dL (ref 0.3–1.2)
Total Protein: 7.9 g/dL (ref 6.5–8.1)

## 2021-05-23 LAB — LACTATE DEHYDROGENASE: LDH: 162 U/L (ref 98–192)

## 2021-05-23 LAB — PREALBUMIN: Prealbumin: 25 mg/dL (ref 18–38)

## 2021-05-23 MED ORDER — OXYCODONE-ACETAMINOPHEN 7.5-325 MG PO TABS
1.0000 | ORAL_TABLET | Freq: Four times a day (QID) | ORAL | 0 refills | Status: DC | PRN
Start: 2021-05-23 — End: 2021-06-06

## 2021-05-23 NOTE — Progress Notes (Signed)
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  HPI: [name] is a very charming 71 year old white female.  She comes in with her daughter.  I must say she is very funny.  She was married at age 44.  Her father was 27 when he had her.  Again, she told a lot of good stories.  She has had 6 back surgeries.  She was having more in the way of lower back pain.  She was seen by her orthopedic surgeon.  He went ahead and got a MRI of the lumbar spine.  Much to his dismay, he was found to be multiple new lesions in the lower thoracic and lumbar spine.  It was felt that this was likely representative of malignancy.  It looks like L3 was mostly involved.  There is no mention of any fractures.  A CT scan was done.  This done on 05/14/2021.  This showed a large left perihilar mass with extensive mediastinal adenopathy.  There was some pleural nodularity on the left.  There is bony metastatic disease in the thoracic and lumbar spine.  The perihilar mass measured 4.5 x 3.9 cm.  An AP window node measured 1.9 cm.  A right paratracheal lymph node measured 3.1 cm.  I will think there is any obvious primary parenchymal neoplasm.  There is no obvious hepatic metastasis.  There is no adrenal metastasis.  She has lost a little bit of weight.  She is lost about 15 pounds.  Her appetite is down a little bit.  She did smoke.  She provides about a 50-pack-year history of tobacco use.  She stopped 6 years ago.  She has had no chest pain.  There is no shortness of breath.  She has had no hemoptysis.  There is blood but no cough.  There is been no change in bowel or bladder habits.  There is been no leg swelling.  She has had no headache.  There is no double vision or blurred vision.  She was kindly referred to the [facility] for an evaluation.  Overall, I would say performance status is  probably ECOG 1.   Past Medical History:  Diagnosis Date   Arthritis    Diabetes mellitus    Esophagitis    Hiatal hernia    Hx of colonic polyps ssp and adenoma 03/06/2018   OSA on CPAP    Sleep apnea    Thyroid disease    TMJ (sprain of temporomandibular joint)   :   Past Surgical History:  Procedure Laterality Date   ABDOMINAL HYSTERECTOMY     BACK SURGERY     x3   BACK SURGERY     x 5 , 2 neck surgeries, 2 lower back surgeries   CHOLECYSTECTOMY     COLONOSCOPY     ERCP     ESOPHAGOGASTRODUODENOSCOPY     THYROID SURGERY     TONSILECTOMY, ADENOIDECTOMY, BILATERAL MYRINGOTOMY AND TUBES    :   Current Outpatient Medications:    albuterol (VENTOLIN HFA) 108 (90 Base) MCG/ACT inhaler, Inhale 2 puffs into the lungs every 6 (six) hours as needed for wheezing or shortness of breath., Disp: 8 g, Rfl: 2   ALPRAZolam (XANAX) 0.5 MG tablet, Take 1 tablet (0.5 mg total) by mouth 3 (three) times daily as needed., Disp: 90 tablet, Rfl: 0   blood  glucose meter kit and supplies KIT, Use daily, Disp: 1 each, Rfl: 0   Blood Glucose Monitoring Suppl (ONE TOUCH ULTRA 2) w/Device KIT, Check blood sugar as directed. Dx:E11.65, Disp: 1 kit, Rfl: 0   Blood Glucose Monitoring Suppl (ONETOUCH VERIO FLEX SYSTEM) w/Device KIT, USE AS DIRECTED TO CHECK BLOOD SUGAR FOUR TIMES A DAY, Disp: 1 kit, Rfl: 0   FLUoxetine (PROZAC) 20 MG capsule, Take 1 capsule (20 mg total) by mouth daily. (Patient not taking: Reported on 04/17/2021), Disp: 90 capsule, Rfl: 3   glucose blood (ONETOUCH ULTRA) test strip, USE TO CHECK BLOOD SUGAR FOUR TIMES A DAY AS DIRECTED, Disp: 400 strip, Rfl: 1   hydrochlorothiazide (HYDRODIURIL) 25 MG tablet, TAKE ONE TABLET BY MOUTH DAILY, Disp: 90 tablet, Rfl: 1   Insulin Glargine (LANTUS SOLOSTAR) 100 UNIT/ML Solostar Pen, 30 u sq qpm, Disp: 15 mL, Rfl: 0   Insulin Pen Needle (NOVOTWIST) 32G X 5 MM MISC, Use daily at Bedtime with Lantus, Disp: 100 each, Rfl: 0   levothyroxine  (SYNTHROID) 75 MCG tablet, levothyroxine 75 mcg tablet, Disp: , Rfl:    meloxicam (MOBIC) 15 MG tablet, TAKE ONE TABLET BY MOUTH DAILY, Disp: 90 tablet, Rfl: 0   metFORMIN (GLUCOPHAGE-XR) 500 MG 24 hr tablet, 2 po qd, Disp: 180 tablet, Rfl: 3   pantoprazole (PROTONIX) 40 MG tablet, Take 1 tablet (40 mg total) by mouth daily., Disp: 30 tablet, Rfl: 11   traMADol (ULTRAM) 50 MG tablet, Take 1 tablet (50 mg total) by mouth every 8 (eight) hours as needed., Disp: 30 tablet, Rfl: 0   TRULICITY 8.93 TD/4.2AJ SOPN, Inject into the skin., Disp: , Rfl:    valACYclovir (VALTREX) 1000 MG tablet, TAKE ONE TABLET BY MOUTH DAILY AS NEEDED, Disp: 30 tablet, Rfl: 0  Current Facility-Administered Medications:    ipratropium-albuterol (DUONEB) 0.5-2.5 (3) MG/3ML nebulizer solution 3 mL, 3 mL, Nebulization, Q6H, Lowne Chase, Yvonne R, DO:   ipratropium-albuterol  3 mL Nebulization Q6H  :   Allergies  Allergen Reactions   Crestor [Rosuvastatin Calcium] Other (See Comments)    Leg cramps   Hydrocodone-Acetaminophen Other (See Comments)    Nightmares, skin crawling  :   Family History  Problem Relation Age of Onset   Stroke Mother    Alcohol abuse Mother    Stroke Maternal Grandmother    Heart disease Maternal Grandfather    Breast cancer Cousin    Breast cancer Daughter    Colon cancer Neg Hx    Esophageal cancer Neg Hx    Rectal cancer Neg Hx    Stomach cancer Neg Hx   :   Social History   Socioeconomic History   Marital status: Married    Spouse name: Not on file   Number of children: Not on file   Years of education: Not on file   Highest education level: Not on file  Occupational History   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    Employer: [employer]  Tobacco Use   Smoking status: Former    Years: 48.00    Types: Cigarettes    Quit date: 02/01/2017    Years since quitting: 4.3   Smokeless tobacco: Never  Vaping Use   Vaping Use: Never used  Substance and Sexual Activity    Alcohol use: No    Alcohol/week: 0.0 standard drinks   Drug use: No   Sexual activity: Yes    Partners: Male  Other Topics Concern   Not on file  Social History  Narrative   Exercise--  3 flts steps a day for 8 hours   Social Determinants of Health   Financial Resource Strain: Not on file  Food Insecurity: Not on file  Transportation Needs: Not on file  Physical Activity: Not on file  Stress: Not on file  Social Connections: Not on file  Intimate Partner Violence: Not on file  :  Review of Systems  Constitutional:  Positive for malaise/fatigue and weight loss.  HENT: Negative.    Eyes: Negative.   Respiratory: Negative.    Cardiovascular: Negative.   Gastrointestinal: Negative.   Genitourinary: Negative.   Musculoskeletal:  Positive for back pain.  Skin: Negative.   Neurological: Negative.   Endo/Heme/Allergies: Negative.   Psychiatric/Behavioral: Negative.      Exam: $Remo'@IPVITALS'tNxWK$ @ Physical Exam Vitals reviewed.  HENT:     Head: Normocephalic and atraumatic.  Eyes:     Pupils: Pupils are equal, round, and reactive to light.  Cardiovascular:     Rate and Rhythm: Normal rate and regular rhythm.     Heart sounds: Normal heart sounds.  Pulmonary:     Effort: Pulmonary effort is normal.     Breath sounds: Normal breath sounds.     Comments: Pulmonary exam shows some wheezing bilaterally.  She has decent air movement bilaterally.  There is no rhonchi. Abdominal:     General: Bowel sounds are normal.     Palpations: Abdomen is soft.  Musculoskeletal:        General: No tenderness or deformity. Normal range of motion.     Cervical back: Normal range of motion.     Comments: Back exam shows some tenderness to palpation in the lower back.  There is some slight muscle spasm in the lower back.  Lymphadenopathy:     Cervical: No cervical adenopathy.  Skin:    General: Skin is warm and dry.     Findings: No erythema or rash.  Neurological:     Mental Status: She is alert  and oriented to person, place, and time.  Psychiatric:        Behavior: Behavior normal.        Thought Content: Thought content normal.        Judgment: Judgment normal.      Recent Labs    05/23/21 1050  WBC 7.1  HGB 13.5  HCT 41.0  PLT 270    Recent Labs    05/23/21 1050  NA 137  K 3.8  CL 96*  CO2 32  GLUCOSE 100*  BUN 14  CREATININE 0.75  CALCIUM 10.4*    Blood smear review: None  Pathology: Pending    Assessment and Plan: [name] is a very nice 71 year old white female.  She has history of tobacco use.  She now has what is all likelihood bronchogenic carcinoma.  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  There is a large perihilar mass.  There is no obvious pulmonary parenchymal mass.  She has metastasis to the spine.  She is scheduled to see Pulmonary Medicine next week.  I am sure they will do a bronchoscopy on her.  There is ample material to be able to sample in the mediastinum.  I am sure that they will get quite a bit of specimen so we can send off for molecular markers.  She is going to need to have Xgeva for her back.  She is clearly going need to have radiation therapy  for her back.  I will contact Radiation Oncology once we have the pathology back.  She will need a PET scan.  She will need an MRI of the brain.  As far as treatment recommendations, this will be based upon the pathology.  Once we get the pathology, we will send it off for molecular analysis to see if there is any targetable mutations.  I told [name] and her daughter that we are dealing with stage IV malignancy and that we can treat this but with that we cannot cure it.  She understands this.  Our goal here is clearly quality of life and to allow her to be able to do what she would like for as long as possible.  I did give her a nice prayer blanket.  She was very thankful for this.  We will plan to get her back once we have the pathology.

## 2021-05-23 NOTE — Progress Notes (Signed)
Initial RN Navigator Patient Visit  Name: [name] Date of Referral : 05/14/2021 Diagnosis: Probable Lung Cancer  Today's visit delayed by Covid diagnosis.   Met with patient prior to their visit with MD. [name] patient "Your Patient Navigator" handout which explains my role, areas in which I am able to help, and all the contact information for myself and the office. Also gave patient MD and Navigator business card. Reviewed with patient the general overview of expected course after initial diagnosis and time frame for all steps to be completed.  New patient packet given to patient which includes: orientation to office and staff; campus directory; education on My Chart and Advance Directives; and patient centered education on Lung Cancer.   Patient is already scheduled for PET on 2/21 and consultation with pulmonary on 2/22.  She will also need an MRI. Scheduled for 06/02/2021. Patient is aware of appointment date, time and location.   Patient completed visit with Dr. Marin Olp.   Patient understands all follow up procedures and expectations. They have my number to reach out for any further clarification or additional needs.   Oncology Nurse Navigator Documentation  Oncology Nurse Navigator Flowsheets 05/23/2021  Abnormal Finding Date -  Diagnosis Status -  Navigator Follow Up Date: 05/27/2021  Navigator Follow Up Reason: Scan Review  Navigator Location [facility]  Referral Date to RadOnc/MedOnc -  Navigator Encounter Type Initial MedOnc  Telephone -  Patient Visit Type MedOnc  Treatment Phase Abnormal Scans  Barriers/Navigation Needs Coordination of Care;Education  Education Newly Diagnosed Cancer Education;Pain/ Symptom Management;Preparing for Upcoming Surgery/ Treatment;Other  Interventions Education;Psycho-Social Support  Acuity Level 2-Minimal Needs (1-2 Barriers Identified)  Referrals -  Coordination of Care Radiology  Education Method Verbal;Written  Support  Groups/Services Friends and Family  Time Spent with Patient 47

## 2021-05-24 ENCOUNTER — Other Ambulatory Visit: Payer: PPO

## 2021-05-26 LAB — CEA (IN HOUSE-CHCC): CEA (CHCC-In House): 18.54 ng/mL — ABNORMAL HIGH (ref 0.00–5.00)

## 2021-05-27 ENCOUNTER — Other Ambulatory Visit: Payer: Self-pay

## 2021-05-27 ENCOUNTER — Encounter (HOSPITAL_COMMUNITY)
Admission: RE | Admit: 2021-05-27 | Discharge: 2021-05-27 | Disposition: A | Discharge status: Home / Self Care | Payer: PPO | Source: Ambulatory Visit | Attending: Hematology & Oncology | Admitting: Hematology & Oncology

## 2021-05-27 DIAGNOSIS — K5730 Diverticulosis of large intestine without perforation or abscess without bleeding: Secondary | ICD-10-CM | POA: Diagnosis not present

## 2021-05-27 DIAGNOSIS — C7951 Secondary malignant neoplasm of bone: Secondary | ICD-10-CM | POA: Diagnosis not present

## 2021-05-27 DIAGNOSIS — R918 Other nonspecific abnormal finding of lung field: Secondary | ICD-10-CM | POA: Insufficient documentation

## 2021-05-27 DIAGNOSIS — C787 Secondary malignant neoplasm of liver and intrahepatic bile duct: Secondary | ICD-10-CM | POA: Diagnosis not present

## 2021-05-27 DIAGNOSIS — I2510 Atherosclerotic heart disease of native coronary artery without angina pectoris: Secondary | ICD-10-CM | POA: Diagnosis not present

## 2021-05-27 LAB — GLUCOSE, CAPILLARY: Glucose-Capillary: 105 mg/dL — ABNORMAL HIGH (ref 70–99)

## 2021-05-27 MED ORDER — FLUDEOXYGLUCOSE F - 18 (FDG) INJECTION
6.0000 | Freq: Once | INTRAVENOUS | Status: AC | PRN
  Administered 2021-05-27: 5.9 via INTRAVENOUS

## 2021-05-28 ENCOUNTER — Encounter: Payer: Self-pay | Admitting: *Deleted

## 2021-05-28 ENCOUNTER — Ambulatory Visit: Payer: PPO | Admitting: Emergency Medicine

## 2021-05-28 ENCOUNTER — Encounter: Payer: Self-pay | Admitting: Emergency Medicine

## 2021-05-28 VITALS — BP 112/68 | HR 113 | Temp 97.7°F | Ht 59.0 in | Wt 121.2 lb

## 2021-05-28 DIAGNOSIS — G4733 Obstructive sleep apnea (adult) (pediatric): Secondary | ICD-10-CM

## 2021-05-28 DIAGNOSIS — R918 Other nonspecific abnormal finding of lung field: Secondary | ICD-10-CM

## 2021-05-28 DIAGNOSIS — Z9989 Dependence on other enabling machines and devices: Secondary | ICD-10-CM | POA: Diagnosis not present

## 2021-05-28 NOTE — Assessment & Plan Note (Signed)
Large left lung mass with bulky mediastinal adenopathy, evidence for metastatic disease on CT scan and PET scan.  Discussed this with her today.  I believe the best strategy for tissue diagnosis is bronchoscopy with biopsies, EBUS.  She understands and agrees.  We will try to get this scheduled soon as possible.   We will work on scheduling bronchoscopy to evaluate your left lung mass.  This will be done under general anesthesia as an outpatient at [facility] endoscopy.  You will need a designated driver.  We will try to get this arranged for 06/02/2021. We will reschedule your MRI brain so it will not conflict with the bronchoscopy We need to set you up to follow-up with Dr. Marin Olp Follow with Dr Lamonte Sakai in 1 month

## 2021-05-28 NOTE — Patient Instructions (Addendum)
We will work on scheduling bronchoscopy to evaluate your left lung mass.  This will be done under general anesthesia as an outpatient at [facility] endoscopy.  You will need a designated driver.  We will try to get this arranged for 06/02/2021. We will reschedule your MRI brain so it will not conflict with the bronchoscopy Try to continue using your CPAP every night Keep your albuterol available to use 2 puffs when you needed for shortness of breath, chest tightness, wheezing. We need to set you up to follow-up with Dr. Marin Olp Follow with Dr Lamonte Sakai in 1 month

## 2021-05-28 NOTE — Progress Notes (Signed)
Subjective:    Patient ID: [name], female    DOB: Nov 30, 1950, 71 y.o.   MRN: [id number]  HPI 71 year old woman with history tobacco use (50 pack years), diabetes, GERD with hiatal hernia, OSA on CPAP, osteoarthritis and DJD.  She was under evaluation for her spine disease and an MRI spine done by orthopedics unfortunately showed evidence for lytic lesions suggestive of malignancy, particularly at L3.  This prompted CT chest 05/14/2021 as below.  She is referred for further evaluation of the scan  She denies any dyspnea. She is active. No cough. No hemoptysis. She does have a lot of back pain, worse lately. She does not wear her CPAP reliably.   CT chest performed 05/14/2021 reviewed by me, shows a left inferior hilar mass narrowing the left lower lobe bronchus and left mainstem bronchus.  There is significant mediastinal adenopathy involving the right pretracheal, left perihilar region, subcarinal region, AP window.  Subtle pleural nodularity over the left hemidiaphragm was noted.  Also fissural thickening along the left major fissure  PET scan 05/27/2021 reviewed by me shows hypermetabolism in all of these areas.    Review of Systems As per HPI  Past Medical History:  Diagnosis Date   Arthritis    Diabetes mellitus    Esophagitis    Goals of care, counseling/discussion 05/23/2021   Hiatal hernia    Hx of colonic polyps ssp and adenoma 03/06/2018   OSA on CPAP    Sleep apnea    Thyroid disease    TMJ (sprain of temporomandibular joint)      Family History  Problem Relation Age of Onset   Stroke Mother    Alcohol abuse Mother    Stroke Maternal Grandmother    Heart disease Maternal Grandfather    Breast cancer Cousin    Breast cancer Daughter    Colon cancer Neg Hx    Esophageal cancer Neg Hx    Rectal cancer Neg Hx    Stomach cancer Neg Hx      Social History   Socioeconomic History   Marital status: Married    Spouse name: Not on file   Number of children: Not on  file   Years of education: Not on file   Highest education level: Not on file  Occupational History   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    Employer: [employer]  Tobacco Use   Smoking status: Former    Years: 48.00    Types: Cigarettes    Quit date: 02/01/2017    Years since quitting: 4.3   Smokeless tobacco: Never  Vaping Use   Vaping Use: Never used  Substance and Sexual Activity   Alcohol use: No    Alcohol/week: 0.0 standard drinks   Drug use: No   Sexual activity: Yes    Partners: Male  Other Topics Concern   Not on file  Social History Narrative   Exercise--  3 flts steps a day for 8 hours   Social Determinants of Health   Financial Resource Strain: Not on file  Food Insecurity: Not on file  Transportation Needs: Not on file  Physical Activity: Not on file  Stress: Not on file  Social Connections: Not on file  Intimate Partner Violence: Not on file    From  Has worked Orthoptist and Field seismologist, 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   Allergies  Allergen Reactions   Crestor [Rosuvastatin Calcium] Other (See Comments)    Leg cramps   Hydrocodone-Acetaminophen Other (See Comments)  Nightmares, skin crawling     Outpatient Medications Prior to Visit  Medication Sig Dispense Refill   albuterol (VENTOLIN HFA) 108 (90 Base) MCG/ACT inhaler Inhale 2 puffs into the lungs every 6 (six) hours as needed for wheezing or shortness of breath. 8 g 2   ALPRAZolam (XANAX) 0.5 MG tablet Take 1 tablet (0.5 mg total) by mouth 3 (three) times daily as needed. 90 tablet 0   blood glucose meter kit and supplies KIT Use daily 1 each 0   Blood Glucose Monitoring Suppl (ONE TOUCH ULTRA 2) w/Device KIT Check blood sugar as directed. Dx:E11.65 1 kit 0   Blood Glucose Monitoring Suppl (ONETOUCH VERIO FLEX SYSTEM) w/Device KIT USE AS DIRECTED TO CHECK BLOOD SUGAR FOUR TIMES A DAY 1 kit 0   FLUoxetine (PROZAC) 20 MG capsule Take 1 capsule (20 mg total) by mouth daily. 90 capsule 3   glucose blood  (ONETOUCH ULTRA) test strip USE TO CHECK BLOOD SUGAR FOUR TIMES A DAY AS DIRECTED 400 strip 1   hydrochlorothiazide (HYDRODIURIL) 25 MG tablet TAKE ONE TABLET BY MOUTH DAILY 90 tablet 1   Insulin Glargine (LANTUS SOLOSTAR) 100 UNIT/ML Solostar Pen 30 u sq qpm 15 mL 0   Insulin Pen Needle (NOVOTWIST) 32G X 5 MM MISC Use daily at Bedtime with Lantus 100 each 0   levothyroxine (SYNTHROID) 75 MCG tablet levothyroxine 75 mcg tablet     meloxicam (MOBIC) 15 MG tablet TAKE ONE TABLET BY MOUTH DAILY 90 tablet 0   metFORMIN (GLUCOPHAGE-XR) 500 MG 24 hr tablet 2 po qd 180 tablet 3   oxyCODONE-acetaminophen (PERCOCET) 7.5-325 MG tablet Take 1 tablet by mouth every 6 (six) hours as needed for severe pain. 90 tablet 0   pantoprazole (PROTONIX) 40 MG tablet Take 1 tablet (40 mg total) by mouth daily. 30 tablet 11   TRULICITY 3.42 AJ/6.8TL SOPN Inject into the skin.     valACYclovir (VALTREX) 1000 MG tablet TAKE ONE TABLET BY MOUTH DAILY AS NEEDED 30 tablet 0   Facility-Administered Medications Prior to Visit  Medication Dose Route Frequency Provider Last Rate Last Admin   ipratropium-albuterol (DUONEB) 0.5-2.5 (3) MG/3ML nebulizer solution 3 mL  3 mL Nebulization Q6H Lowne Lyndal Pulley R, DO             Objective:   Physical Exam Vitals:   05/28/21 1021  BP: 112/68  Pulse: (!) 113  Temp: 97.7 F (36.5 C)  TempSrc: Oral  SpO2: 94%  Weight: 121 lb 3.2 oz (55 kg)  Height: $Remove'4\' 11"'cpwvioM$  (1.499 m)   Gen: Pleasant, well-nourished, in no distress,  normal affect  ENT: No lesions,  mouth clear,  oropharynx clear, no postnasal drip  Neck: No JVD, no stridor  Lungs: No use of accessory muscles, soft inspiratory squeak on the left.  The right is clear.  No wheezing or crackles  Cardiovascular: RRR, heart sounds normal, no murmur or gallops, no peripheral edema  Musculoskeletal: No deformities, no cyanosis or clubbing  Neuro: alert, awake, non focal  Skin: Warm, no lesions or rash     Assessment &  Plan:   Lung mass Large left lung mass with bulky mediastinal adenopathy, evidence for metastatic disease on CT scan and PET scan.  Discussed this with her today.  I believe the best strategy for tissue diagnosis is bronchoscopy with biopsies, EBUS.  She understands and agrees.  We will try to get this scheduled soon as possible.   We will work on scheduling bronchoscopy to  evaluate your left lung mass.  This will be done under general anesthesia as an outpatient at [facility] endoscopy.  You will need a designated driver.  We will try to get this arranged for 06/02/2021. We will reschedule your MRI brain so it will not conflict with the bronchoscopy We need to set you up to follow-up with Dr. Marin Olp Follow with Dr Lamonte Sakai in 1 month  Obstructive sleep apnea treated with continuous positive airway pressure (CPAP) Try to continue using your CPAP every night  Baltazar Apo, MD, PhD 05/28/2021, 11:08 AM [facility] [phone] or if no answer before 7:00PM call [phone] For any issues after 7:00PM please call eLink [phone]

## 2021-05-28 NOTE — Assessment & Plan Note (Signed)
Try to continue using your CPAP every night

## 2021-05-28 NOTE — Progress Notes (Signed)
Patient seen by pulmonary today and plan for bronch for tissue sampling. Bronch scheduled for 06/02/21. Will follow for path.   Oncology Nurse Navigator Documentation  Oncology Nurse Navigator Flowsheets 05/28/2021  Abnormal Finding Date -  Diagnosis Status -  Navigator Follow Up Date: 06/02/2021  Navigator Follow Up Reason: Review Note;Scan Review  Navigator Location [facility]  Referral Date to RadOnc/MedOnc -  Navigator Encounter Type Appt/Treatment Plan Review;Scan Review  Telephone -  Patient Visit Type MedOnc  Treatment Phase Abnormal Scans  Barriers/Navigation Needs Coordination of Care;Education  Education -  Interventions None Required  Acuity Level 2-Minimal Needs (1-2 Barriers Identified)  Referrals -  Coordination of Care -  Education Method -  Support Groups/Services Friends and Family  Time Spent with Patient 15

## 2021-05-28 NOTE — H&P (View-Only) (Signed)
Subjective:    Patient ID: [name], female    DOB: Sep 23, 1950, 71 y.o.   MRN: [id number]  HPI 71 year old woman with history tobacco use (50 pack years), diabetes, GERD with hiatal hernia, OSA on CPAP, osteoarthritis and DJD.  She was under evaluation for her spine disease and an MRI spine done by orthopedics unfortunately showed evidence for lytic lesions suggestive of malignancy, particularly at L3.  This prompted CT chest 05/14/2021 as below.  She is referred for further evaluation of the scan  She denies any dyspnea. She is active. No cough. No hemoptysis. She does have a lot of back pain, worse lately. She does not wear her CPAP reliably.   CT chest performed 05/14/2021 reviewed by me, shows a left inferior hilar mass narrowing the left lower lobe bronchus and left mainstem bronchus.  There is significant mediastinal adenopathy involving the right pretracheal, left perihilar region, subcarinal region, AP window.  Subtle pleural nodularity over the left hemidiaphragm was noted.  Also fissural thickening along the left major fissure  PET scan 05/27/2021 reviewed by me shows hypermetabolism in all of these areas.    Review of Systems As per HPI  Past Medical History:  Diagnosis Date   Arthritis    Diabetes mellitus    Esophagitis    Goals of care, counseling/discussion 05/23/2021   Hiatal hernia    Hx of colonic polyps ssp and adenoma 03/06/2018   OSA on CPAP    Sleep apnea    Thyroid disease    TMJ (sprain of temporomandibular joint)      Family History  Problem Relation Age of Onset   Stroke Mother    Alcohol abuse Mother    Stroke Maternal Grandmother    Heart disease Maternal Grandfather    Breast cancer Cousin    Breast cancer Daughter    Colon cancer Neg Hx    Esophageal cancer Neg Hx    Rectal cancer Neg Hx    Stomach cancer Neg Hx      Social History   Socioeconomic History   Marital status: Married    Spouse name: Not on file   Number of children: Not on  file   Years of education: Not on file   Highest education level: Not on file  Occupational History   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    Employer: [employer]  Tobacco Use   Smoking status: Former    Years: 48.00    Types: Cigarettes    Quit date: 02/01/2017    Years since quitting: 4.3   Smokeless tobacco: Never  Vaping Use   Vaping Use: Never used  Substance and Sexual Activity   Alcohol use: No    Alcohol/week: 0.0 standard drinks   Drug use: No   Sexual activity: Yes    Partners: Male  Other Topics Concern   Not on file  Social History Narrative   Exercise--  3 flts steps a day for 8 hours   Social Determinants of Health   Financial Resource Strain: Not on file  Food Insecurity: Not on file  Transportation Needs: Not on file  Physical Activity: Not on file  Stress: Not on file  Social Connections: Not on file  Intimate Partner Violence: Not on file    From [place] Has worked Orthoptist and Field seismologist, 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   Allergies  Allergen Reactions   Crestor [Rosuvastatin Calcium] Other (See Comments)    Leg cramps   Hydrocodone-Acetaminophen Other (See Comments)  Nightmares, skin crawling     Outpatient Medications Prior to Visit  Medication Sig Dispense Refill   albuterol (VENTOLIN HFA) 108 (90 Base) MCG/ACT inhaler Inhale 2 puffs into the lungs every 6 (six) hours as needed for wheezing or shortness of breath. 8 g 2   ALPRAZolam (XANAX) 0.5 MG tablet Take 1 tablet (0.5 mg total) by mouth 3 (three) times daily as needed. 90 tablet 0   blood glucose meter kit and supplies KIT Use daily 1 each 0   Blood Glucose Monitoring Suppl (ONE TOUCH ULTRA 2) w/Device KIT Check blood sugar as directed. Dx:E11.65 1 kit 0   Blood Glucose Monitoring Suppl (ONETOUCH VERIO FLEX SYSTEM) w/Device KIT USE AS DIRECTED TO CHECK BLOOD SUGAR FOUR TIMES A DAY 1 kit 0   FLUoxetine (PROZAC) 20 MG capsule Take 1 capsule (20 mg total) by mouth daily. 90 capsule 3   glucose blood  (ONETOUCH ULTRA) test strip USE TO CHECK BLOOD SUGAR FOUR TIMES A DAY AS DIRECTED 400 strip 1   hydrochlorothiazide (HYDRODIURIL) 25 MG tablet TAKE ONE TABLET BY MOUTH DAILY 90 tablet 1   Insulin Glargine (LANTUS SOLOSTAR) 100 UNIT/ML Solostar Pen 30 u sq qpm 15 mL 0   Insulin Pen Needle (NOVOTWIST) 32G X 5 MM MISC Use daily at Bedtime with Lantus 100 each 0   levothyroxine (SYNTHROID) 75 MCG tablet levothyroxine 75 mcg tablet     meloxicam (MOBIC) 15 MG tablet TAKE ONE TABLET BY MOUTH DAILY 90 tablet 0   metFORMIN (GLUCOPHAGE-XR) 500 MG 24 hr tablet 2 po qd 180 tablet 3   oxyCODONE-acetaminophen (PERCOCET) 7.5-325 MG tablet Take 1 tablet by mouth every 6 (six) hours as needed for severe pain. 90 tablet 0   pantoprazole (PROTONIX) 40 MG tablet Take 1 tablet (40 mg total) by mouth daily. 30 tablet 11   TRULICITY 7.09 GG/8.3MO SOPN Inject into the skin.     valACYclovir (VALTREX) 1000 MG tablet TAKE ONE TABLET BY MOUTH DAILY AS NEEDED 30 tablet 0   Facility-Administered Medications Prior to Visit  Medication Dose Route Frequency Provider Last Rate Last Admin   ipratropium-albuterol (DUONEB) 0.5-2.5 (3) MG/3ML nebulizer solution 3 mL  3 mL Nebulization Q6H Lowne Lyndal Pulley R, DO             Objective:   Physical Exam Vitals:   05/28/21 1021  BP: 112/68  Pulse: (!) 113  Temp: 97.7 F (36.5 C)  TempSrc: Oral  SpO2: 94%  Weight: 121 lb 3.2 oz (55 kg)  Height: $Remove'4\' 11"'mrGbGZH$  (1.499 m)   Gen: Pleasant, well-nourished, in no distress,  normal affect  ENT: No lesions,  mouth clear,  oropharynx clear, no postnasal drip  Neck: No JVD, no stridor  Lungs: No use of accessory muscles, soft inspiratory squeak on the left.  The right is clear.  No wheezing or crackles  Cardiovascular: RRR, heart sounds normal, no murmur or gallops, no peripheral edema  Musculoskeletal: No deformities, no cyanosis or clubbing  Neuro: alert, awake, non focal  Skin: Warm, no lesions or rash     Assessment &  Plan:   Lung mass Large left lung mass with bulky mediastinal adenopathy, evidence for metastatic disease on CT scan and PET scan.  Discussed this with her today.  I believe the best strategy for tissue diagnosis is bronchoscopy with biopsies, EBUS.  She understands and agrees.  We will try to get this scheduled soon as possible.   We will work on scheduling bronchoscopy to  evaluate your left lung mass.  This will be done under general anesthesia as an outpatient [facility] end[facility].  You will need a designated driver.  We will try to get this arranged for 06/02/2021. We will reschedule your MRI brain so it will not conflict with the bronchoscopy We need to set you up to follow-up with Dr. Marin Olp Follow with Dr Lamonte Sakai in 1 month  Obstructive sleep apnea treated with continuous positive airway pressure (CPAP) Try to continue using your CPAP every night  Baltazar Apo, MD, PhD 05/28/2021, 11:08 AM [facility] [phone] or if no answer before 7:00PM call [phone] For any issues after 7:00PM please call eLink [phone]

## 2021-05-30 ENCOUNTER — Encounter (HOSPITAL_COMMUNITY): Payer: Self-pay | Admitting: Emergency Medicine

## 2021-05-30 ENCOUNTER — Other Ambulatory Visit: Payer: Self-pay | Admitting: Emergency Medicine

## 2021-05-30 ENCOUNTER — Other Ambulatory Visit: Payer: Self-pay

## 2021-05-30 LAB — SARS CORONAVIRUS 2 (TAT 6-24 HRS): SARS Coronavirus 2: NEGATIVE

## 2021-05-30 NOTE — Progress Notes (Signed)
[name] denies chest pain or shortness of breath. Patient denies having any s/s of Covid in her household.  Patient denies any known exposure to Covid.    PCP is Dr. Lyndal Pulley.  Endrocrinologist is Dr. Buddy Duty.   [name] reports that CBGs run 74- 110.  I instructed patient to take 15 units of Lantus at hs on Sunday pm. I instructed patient to not take any medications on Monday am. I instructed patient to check CBG after awaking and every 2 hours until arrival  to the hospital.  I Instructed patient if CBG is less than 70 to take 4 Glucose Tablets or 1 tube of Glucose Gel or 1/2 cup of a clear juice. Recheck CBG in 15 minutes if CBG is not over 70 call, pre- op desk at [phone] for further instructions.   I instructed [name]  to shower with antibiotic soap, if it is available.  Dry off with a clean towel. Do not put lotion, powder, cologne or deodorant or makeup.No jewelry or piercings. Men may shave their face and neck. Woman should not shave. No nail polish, artificial or acrylic nails. Wear clean clothes, brush your teeth. Glasses, contact lens,dentures or partials may not be worn in the OR. If you need to wear them, please bring a case for glasses, do not wear contacts or bring a case, the hospital does not have contact cases, dentures or partials will have to be removed , make sure they are clean, we will provide a denture cup to put them in. You will need some one to drive you home and a responsible person over the age of 19 to stay with you for the first 24 hours after surgery.

## 2021-06-01 NOTE — Anesthesia Preprocedure Evaluation (Addendum)
Anesthesia Evaluation  Patient identified by MRN, date of birth, ID band Patient awake    Reviewed: Allergy & Precautions, H&P , NPO status , Patient's Chart, lab work & pertinent test results  Airway Mallampati: II  TM Distance: >3 FB Neck ROM: Full    Dental no notable dental hx. (+) Teeth Intact, Dental Advisory Given   Pulmonary neg pulmonary ROS, sleep apnea and Continuous Positive Airway Pressure Ventilation , former smoker,    Pulmonary exam normal breath sounds clear to auscultation       Cardiovascular Exercise Tolerance: Good hypertension, Pt. on medications negative cardio ROS Normal cardiovascular exam Rhythm:Regular Rate:Normal     Neuro/Psych  Headaches, PSYCHIATRIC DISORDERS Anxiety negative neurological ROS  negative psych ROS   GI/Hepatic negative GI ROS, Neg liver ROS, hiatal hernia,   Endo/Other  negative endocrine ROSdiabetes, Type 2Hypothyroidism   Renal/GU negative Renal ROS  negative genitourinary   Musculoskeletal negative musculoskeletal ROS (+) Arthritis , Osteoarthritis,    Abdominal   Peds negative pediatric ROS (+)  Hematology negative hematology ROS (+)   Anesthesia Other Findings   Reproductive/Obstetrics negative OB ROS                            Anesthesia Physical Anesthesia Plan  ASA: 3  Anesthesia Plan: General   Post-op Pain Management: Minimal or no pain anticipated   Induction: Intravenous  PONV Risk Score and Plan: 3 and Ondansetron and Dexamethasone  Airway Management Planned: Oral ETT  Additional Equipment: None  Intra-op Plan:   Post-operative Plan: Extubation in OR  Informed Consent: I have reviewed the patients History and Physical, chart, labs and discussed the procedure including the risks, benefits and alternatives for the proposed anesthesia with the patient or authorized representative who has indicated his/her understanding  and acceptance.       Plan Discussed with: Anesthesiologist and CRNA  Anesthesia Plan Comments: (  )       Anesthesia Quick Evaluation

## 2021-06-02 ENCOUNTER — Encounter: Payer: Self-pay | Admitting: *Deleted

## 2021-06-02 ENCOUNTER — Ambulatory Visit (HOSPITAL_COMMUNITY): Payer: PPO | Admitting: Anesthesiology

## 2021-06-02 ENCOUNTER — Ambulatory Visit (HOSPITAL_COMMUNITY): Admission: RE | Admit: 2021-06-02 | Payer: PPO | Source: Ambulatory Visit

## 2021-06-02 ENCOUNTER — Ambulatory Visit (HOSPITAL_COMMUNITY)
Admission: RE | Admit: 2021-06-02 | Discharge: 2021-06-02 | Disposition: A | Discharge status: Home / Self Care | Payer: PPO | Source: Ambulatory Visit | Attending: Emergency Medicine | Admitting: Emergency Medicine

## 2021-06-02 ENCOUNTER — Encounter (HOSPITAL_COMMUNITY): Payer: Self-pay | Admitting: Emergency Medicine

## 2021-06-02 ENCOUNTER — Ambulatory Visit (HOSPITAL_BASED_OUTPATIENT_CLINIC_OR_DEPARTMENT_OTHER): Payer: PPO | Admitting: Anesthesiology

## 2021-06-02 ENCOUNTER — Encounter (HOSPITAL_COMMUNITY)
Admission: RE | Disposition: A | Discharge status: Home / Self Care | Payer: Self-pay | Source: Ambulatory Visit | Attending: Emergency Medicine

## 2021-06-02 ENCOUNTER — Other Ambulatory Visit: Payer: Self-pay

## 2021-06-02 DIAGNOSIS — K219 Gastro-esophageal reflux disease without esophagitis: Secondary | ICD-10-CM | POA: Diagnosis not present

## 2021-06-02 DIAGNOSIS — G4733 Obstructive sleep apnea (adult) (pediatric): Secondary | ICD-10-CM

## 2021-06-02 DIAGNOSIS — Z87891 Personal history of nicotine dependence: Secondary | ICD-10-CM | POA: Insufficient documentation

## 2021-06-02 DIAGNOSIS — Z79899 Other long term (current) drug therapy: Secondary | ICD-10-CM | POA: Insufficient documentation

## 2021-06-02 DIAGNOSIS — R918 Other nonspecific abnormal finding of lung field: Secondary | ICD-10-CM

## 2021-06-02 DIAGNOSIS — E119 Type 2 diabetes mellitus without complications: Secondary | ICD-10-CM | POA: Diagnosis not present

## 2021-06-02 DIAGNOSIS — C771 Secondary and unspecified malignant neoplasm of intrathoracic lymph nodes: Secondary | ICD-10-CM | POA: Insufficient documentation

## 2021-06-02 DIAGNOSIS — I10 Essential (primary) hypertension: Secondary | ICD-10-CM | POA: Diagnosis not present

## 2021-06-02 DIAGNOSIS — M1990 Unspecified osteoarthritis, unspecified site: Secondary | ICD-10-CM | POA: Insufficient documentation

## 2021-06-02 DIAGNOSIS — C969 Malignant neoplasm of lymphoid, hematopoietic and related tissue, unspecified: Secondary | ICD-10-CM | POA: Diagnosis not present

## 2021-06-02 DIAGNOSIS — F419 Anxiety disorder, unspecified: Secondary | ICD-10-CM | POA: Insufficient documentation

## 2021-06-02 DIAGNOSIS — E039 Hypothyroidism, unspecified: Secondary | ICD-10-CM | POA: Diagnosis not present

## 2021-06-02 DIAGNOSIS — E1165 Type 2 diabetes mellitus with hyperglycemia: Secondary | ICD-10-CM

## 2021-06-02 DIAGNOSIS — R590 Localized enlarged lymph nodes: Secondary | ICD-10-CM

## 2021-06-02 DIAGNOSIS — C3432 Malignant neoplasm of lower lobe, left bronchus or lung: Secondary | ICD-10-CM | POA: Insufficient documentation

## 2021-06-02 HISTORY — PX: BRONCHIAL BRUSHINGS: SHX5108

## 2021-06-02 HISTORY — DX: Malignant (primary) neoplasm, unspecified: C80.1

## 2021-06-02 HISTORY — PX: BRONCHIAL NEEDLE ASPIRATION BIOPSY: SHX5106

## 2021-06-02 HISTORY — DX: Hypothyroidism, unspecified: E03.9

## 2021-06-02 HISTORY — PX: BRONCHIAL BIOPSY: SHX5109

## 2021-06-02 HISTORY — DX: Essential (primary) hypertension: I10

## 2021-06-02 HISTORY — PX: VIDEO BRONCHOSCOPY WITH ENDOBRONCHIAL ULTRASOUND: SHX6177

## 2021-06-02 LAB — GLUCOSE, CAPILLARY
Glucose-Capillary: 172 mg/dL — ABNORMAL HIGH (ref 70–99)
Glucose-Capillary: 168 mg/dL — ABNORMAL HIGH (ref 70–99)

## 2021-06-02 SURGERY — BRONCHOSCOPY, WITH EBUS
Anesthesia: General | Laterality: Left

## 2021-06-02 MED ORDER — EPINEPHRINE 1 MG/10ML IJ SOSY
PREFILLED_SYRINGE | INTRAMUSCULAR | Status: AC
  Filled 2021-06-02: qty 10

## 2021-06-02 MED ORDER — LIDOCAINE 2% (20 MG/ML) 5 ML SYRINGE
INTRAMUSCULAR | Status: DC | PRN
Start: 2021-06-02 — End: 2021-06-02
  Administered 2021-06-02: 100 mg via INTRAVENOUS

## 2021-06-02 MED ORDER — OXYCODONE HCL 5 MG PO TABS: 5.0000 mg | ORAL_TABLET | Freq: Once | ORAL | Status: DC | PRN

## 2021-06-02 MED ORDER — ACETAMINOPHEN 325 MG PO TABS: 325.0000 mg | ORAL_TABLET | ORAL | Status: DC | PRN

## 2021-06-02 MED ORDER — DEXAMETHASONE SODIUM PHOSPHATE 10 MG/ML IJ SOLN
INTRAMUSCULAR | Status: DC | PRN
Start: 2021-06-02 — End: 2021-06-02
  Administered 2021-06-02: 5 mg via INTRAVENOUS

## 2021-06-02 MED ORDER — ONDANSETRON HCL 4 MG/2ML IJ SOLN
INTRAMUSCULAR | Status: DC | PRN
  Administered 2021-06-02: 4 mg via INTRAVENOUS

## 2021-06-02 MED ORDER — FENTANYL CITRATE (PF) 100 MCG/2ML IJ SOLN: 25.0000 ug | INTRAMUSCULAR | Status: DC | PRN

## 2021-06-02 MED ORDER — ALPRAZOLAM 0.5 MG PO TABS: 0.5000 mg | ORAL_TABLET | Freq: Every day | ORAL | Status: DC

## 2021-06-02 MED ORDER — ACETAMINOPHEN 160 MG/5ML PO SOLN: 325.0000 mg | ORAL | Status: DC | PRN

## 2021-06-02 MED ORDER — CHLORHEXIDINE GLUCONATE 0.12 % MT SOLN
OROMUCOSAL | Status: AC
  Filled 2021-06-02: qty 15

## 2021-06-02 MED ORDER — SUGAMMADEX SODIUM 200 MG/2ML IV SOLN
INTRAVENOUS | Status: DC | PRN
  Administered 2021-06-02: 200 mg via INTRAVENOUS

## 2021-06-02 MED ORDER — ROCURONIUM BROMIDE 10 MG/ML (PF) SYRINGE
PREFILLED_SYRINGE | INTRAVENOUS | Status: DC | PRN
Start: 2021-06-02 — End: 2021-06-02
  Administered 2021-06-02: 60 mg via INTRAVENOUS

## 2021-06-02 MED ORDER — ESMOLOL HCL 100 MG/10ML IV SOLN
INTRAVENOUS | Status: DC | PRN
  Administered 2021-06-02: 20 mg via INTRAVENOUS

## 2021-06-02 MED ORDER — ONDANSETRON HCL 4 MG/2ML IJ SOLN: 4.0000 mg | Freq: Once | INTRAMUSCULAR | Status: DC | PRN

## 2021-06-02 MED ORDER — PROPOFOL 10 MG/ML IV BOLUS
INTRAVENOUS | Status: DC | PRN
  Administered 2021-06-02: 120 mg via INTRAVENOUS
  Administered 2021-06-02: 20 mg via INTRAVENOUS

## 2021-06-02 MED ORDER — METFORMIN HCL ER 500 MG PO TB24: 500.0000 mg | ORAL_TABLET | Freq: Two times a day (BID) | ORAL | Status: DC

## 2021-06-02 MED ORDER — FENTANYL CITRATE (PF) 250 MCG/5ML IJ SOLN
INTRAMUSCULAR | Status: DC | PRN
  Administered 2021-06-02 (×2): 50 ug via INTRAVENOUS

## 2021-06-02 MED ORDER — OXYCODONE HCL 5 MG/5ML PO SOLN: 5.0000 mg | Freq: Once | ORAL | Status: DC | PRN

## 2021-06-02 MED ORDER — LANTUS SOLOSTAR 100 UNIT/ML ~~LOC~~ SOPN: 30.0000 [IU] | PEN_INJECTOR | Freq: Every day | SUBCUTANEOUS | Status: DC

## 2021-06-02 MED ORDER — MEPERIDINE HCL 25 MG/ML IJ SOLN: 6.2500 mg | INTRAMUSCULAR | Status: DC | PRN

## 2021-06-02 MED ORDER — LACTATED RINGERS IV SOLN: INTRAVENOUS | Status: DC

## 2021-06-02 MED ORDER — INSULIN ASPART 100 UNIT/ML IJ SOLN: 0.0000 [IU] | INTRAMUSCULAR | Status: DC | PRN

## 2021-06-02 MED ORDER — SODIUM CHLORIDE (PF) 0.9 % IJ SOLN
PREFILLED_SYRINGE | INTRAMUSCULAR | Status: DC | PRN
  Administered 2021-06-02: 6 mL

## 2021-06-02 NOTE — Op Note (Signed)
Video Bronchoscopy with Endobronchial Ultrasound Procedure Note  Date of Operation: 06/02/2021  Pre-op Diagnosis: Left hilar mass, mediastinal adenopathy  Post-op Diagnosis: Same  Surgeon: Baltazar Apo  Assistants: None  Anesthesia: General endotracheal anesthesia  Operation: Flexible video fiberoptic bronchoscopy with endobronchial ultrasound and biopsies.  Estimated Blood Loss: 25 cc  Complications: None apparent  Indications and History: [name] is a 71 y.o. female with history tobacco use.  She is under evaluation for evidence for metastatic disease on CT scan and subsequent PET scan done 05/27/2021.  She had a new left hilar mass, mediastinal adenopathy identified on those scans.  Recommendation made to achieve a tissue diagnosis via bronchoscopy with endobronchial ultrasound and biopsies.  The risks, benefits, complications, treatment options and expected outcomes were discussed with the patient.  The possibilities of pneumothorax, pneumonia, reaction to medication, pulmonary aspiration, perforation of a viscus, bleeding, failure to diagnose a condition and creating a complication requiring transfusion or operation were discussed with the patient who freely signed the consent.    Description of Procedure: The patient was examined in the preoperative area and history and data from the preprocedure consultation were reviewed. It was deemed appropriate to proceed.  The patient was taken to [facility] endoscopy room 3, identified as [facility] and the procedure verified as Flexible Video Fiberoptic Bronchoscopy.  A Time Out was held and the above information confirmed. After being taken to the operating room general anesthesia was initiated and the patient  was orally intubated. The video fiberoptic bronchoscope was introduced via the endotracheal tube and a general inspection was performed which showed normal airways on the right.  There was a raised irregular endobronchial lesion  occluding and emanating from the left lower lobe bronchus.  The bronchus was fully occluded.  Endobronchial brushings and biopsies were performed on this lesion at the orifice of the left lower lobe. The standard scope was then withdrawn and the endobronchial ultrasound was used to identify and characterize the peritracheal, hilar and bronchial lymph nodes. Inspection showed enlargement at station 4R, 7, 10 L. Using real-time ultrasound guidance Wang needle biopsies were take from Station 4R, 7, 10 L nodes and were sent for cytology. The patient tolerated the procedure well without apparent complications. There was no significant blood loss. The bronchoscope was withdrawn. Anesthesia was reversed and the patient was taken to the PACU for recovery.   Samples: 1. Wang needle biopsies from 4R node 2. Wang needle biopsies from 7 node 3. Wang needle biopsies from 10 L node 4.  Endobronchial brushings left lower lobe bronchus 5.  Endobronchial biopsies left lower lobe bronchus  Plans:  The patient will be discharged from the PACU to home when recovered from anesthesia. We will review the cytology, pathology and microbiology results with the patient when they become available. Outpatient followup will be with Dr. Lamonte Sakai and Dr. Marin Olp.   Baltazar Apo, MD, PhD 06/02/2021, 9:11 AM [facility] [phone] or if no answer before 7:00PM call [phone] For any issues after 7:00PM please call eLink [phone]

## 2021-06-02 NOTE — Progress Notes (Signed)
Patient had bronch this morning. Will follow for path results.   Oncology Nurse Navigator Documentation  Oncology Nurse Navigator Flowsheets 06/02/2021  Abnormal Finding Date -  Diagnosis Status -  Navigator Follow Up Date: 06/06/2021  Navigator Follow Up Reason: Pathology  Navigator Location [facility]  Referral Date to RadOnc/MedOnc -  Navigator Encounter Type Appt/Treatment Plan Review  Telephone -  Patient Visit Type MedOnc  Treatment Phase Abnormal Scans  Barriers/Navigation Needs Coordination of Care;Education  Education -  Interventions None Required  Acuity Level 2-Minimal Needs (1-2 Barriers Identified)  Referrals -  Coordination of Care -  Education Method -  Support Groups/Services Friends and Family  Time Spent with Patient 15

## 2021-06-02 NOTE — Interval H&P Note (Signed)
History and Physical Interval Note:  06/02/2021 7:20 AM  [name]  has presented today for surgery, with the diagnosis of LEFT LUNG MASS WITH MEDIASTINAL ADENOPATHY.  The various methods of treatment have been discussed with the patient and family. After consideration of risks, benefits and other options for treatment, the patient has consented to  Procedure(s): [place] (Left) as a surgical intervention.  The patient's history has been reviewed, patient examined, no change in status, stable for surgery.  I have reviewed the patient's chart and labs.  Questions were answered to the patient's satisfaction.     Collene Gobble

## 2021-06-02 NOTE — Anesthesia Procedure Notes (Signed)
Procedure Name: Intubation Date/Time: 06/02/2021 7:41 AM Performed by: Dorann Lodge, CRNA Pre-anesthesia Checklist: Patient identified, Emergency Drugs available, Suction available and Patient being monitored Patient Re-evaluated:Patient Re-evaluated prior to induction Oxygen Delivery Method: Circle System Utilized Preoxygenation: Pre-oxygenation with 100% oxygen Induction Type: IV induction Ventilation: Mask ventilation without difficulty and Oral airway inserted - appropriate to patient size Laryngoscope Size: Mac and 4 Grade View: Grade II Tube type: Oral Tube size: 8.0 mm Number of attempts: 2 Airway Equipment and Method: Stylet and Oral airway Placement Confirmation: ETT inserted through vocal cords under direct vision, positive ETCO2 and breath sounds checked- equal and bilateral Secured at: 22 cm Tube secured with: Tape Dental Injury: Teeth and Oropharynx as per pre-operative assessment  Comments: 1 attempt by CRNA to pass 8.5ETT with grade II view, unable to pass tube. 2nd attempt used 8.0 ETT with grade II view with success by MDA.

## 2021-06-02 NOTE — Anesthesia Postprocedure Evaluation (Signed)
Anesthesia Post Note  Patient: [name]  Procedure(s) Performed: VIDEO BRONCHOSCOPY WITH ENDOBRONCHIAL ULTRASOUND (Left) BRONCHIAL BIOPSIES BRONCHIAL BRUSHINGS BRONCHIAL NEEDLE ASPIRATION BIOPSIES     Patient location during evaluation: PACU Anesthesia Type: General Level of consciousness: awake and alert Pain management: pain level controlled Vital Signs Assessment: post-procedure vital signs reviewed and stable Respiratory status: spontaneous breathing, nonlabored ventilation, respiratory function stable and patient connected to nasal cannula oxygen Cardiovascular status: blood pressure returned to baseline and stable Postop Assessment: no apparent nausea or vomiting Anesthetic complications: no   No notable events documented.  Last Vitals:  Vitals:   06/02/21 0950 06/02/21 1005  BP: (!) 141/84 (!) 148/93  Pulse: 94 97  Resp: 13 17  Temp:  36.6 C  SpO2: 95% 93%    Last Pain:  Vitals:   06/02/21 1005  TempSrc:   PainSc: 0-No pain                 Quashawn Jewkes

## 2021-06-02 NOTE — Discharge Instructions (Signed)
Flexible Bronchoscopy, Care After This sheet gives you information about how to care for yourself after your test. Your doctor may also give you more specific instructions. If you have problems or questions, contact your doctor. Follow these instructions at home: Eating and drinking Do not eat or drink anything (not even water) for 2 hours after your test, or until your numbing medicine (local anesthetic) wears off. When your numbness is gone and your cough and gag reflexes have come back, you may: Eat only soft foods. Slowly drink liquids. The day after the test, go back to your normal diet. Driving Do not drive for 24 hours if you were given a medicine to help you relax (sedative). Do not drive or use heavy machinery while taking prescription pain medicine. General instructions  Take over-the-counter and prescription medicines only as told by your doctor. Return to your normal activities as told. Ask what activities are safe for you. Do not use any products that have nicotine or tobacco in them. This includes cigarettes and e-cigarettes. If you need help quitting, ask your doctor. Keep all follow-up visits as told by your doctor. This is important. It is very important if you had a tissue sample (biopsy) taken. Get help right away if: You have shortness of breath that gets worse. You get light-headed. You feel like you are going to pass out (faint). You have chest pain. You cough up: More than a little blood. More blood than before. Summary Do not eat or drink anything (not even water) for 2 hours after your test, or until your numbing medicine wears off. Do not use cigarettes. Do not use e-cigarettes. Get help right away if you have chest pain.  Please call our office for any questions or concerns.  [phone].  This information is not intended to replace advice given to you by your health care provider. Make sure you discuss any questions you have with your health care  provider. Document Released: 01/18/2009 Document Revised: 03/05/2017 Document Reviewed: 04/10/2016 Elsevier Patient Education  2020 [employer].

## 2021-06-02 NOTE — Transfer of Care (Signed)
Immediate Anesthesia Transfer of Care Note  Patient: [name]  Procedure(s) Performed: VIDEO BRONCHOSCOPY WITH ENDOBRONCHIAL ULTRASOUND (Left) BRONCHIAL BIOPSIES BRONCHIAL BRUSHINGS BRONCHIAL NEEDLE ASPIRATION BIOPSIES  Patient Location: PACU  Anesthesia Type:General  Level of Consciousness: awake and drowsy  Airway & Oxygen Therapy: Patient Spontanous Breathing and Patient connected to face mask oxygen  Post-op Assessment: Report given to RN and Post -op Vital signs reviewed and stable  Post vital signs: Reviewed and stable  Last Vitals:  Vitals Value Taken Time  BP 154/91 06/02/21 0911  Temp 36.6 C 06/02/21 0911  Pulse 98 06/02/21 0922  Resp 15 06/02/21 0922  SpO2 100 % 06/02/21 0922  Vitals shown include unvalidated device data.  Last Pain:  Vitals:   06/02/21 0911  TempSrc:   PainSc: Asleep      Patients Stated Pain Goal: 0 (49/61/16 4353)  Complications: No notable events documented.

## 2021-06-03 ENCOUNTER — Encounter (HOSPITAL_COMMUNITY): Payer: Self-pay | Admitting: Emergency Medicine

## 2021-06-03 ENCOUNTER — Encounter: Payer: Self-pay | Admitting: *Deleted

## 2021-06-03 LAB — CYTOLOGY - NON PAP

## 2021-06-03 NOTE — Progress Notes (Signed)
Patient had to cancel the MRI for her bronch yesterday. Called her today. She is doing well after her procedure yesterday. She does have a sore throat, but has lozenges and is managing her discomfort. Asked her if she would like to reschedule her appointment or have me do it. She asked that I reschedule.   MRI rescheduled for 06/13/21. Called and notified patient of appointment including date, time and location.   Oncology Nurse Navigator Documentation  Oncology Nurse Navigator Flowsheets 06/03/2021  Abnormal Finding Date -  Diagnosis Status -  Navigator Follow Up Date: 06/06/2021  Navigator Follow Up Reason: Pathology  Navigator Location [facility]  Referral Date to RadOnc/MedOnc -  Navigator Encounter Type Telephone  Telephone Appt Confirmation/Clarification;Outgoing Call  Patient Visit Type MedOnc  Treatment Phase Abnormal Scans  Barriers/Navigation Needs Coordination of Care;Education  Education Other  Interventions Coordination of Care;Education;Psycho-Social Support  Acuity Level 2-Minimal Needs (1-2 Barriers Identified)  Referrals -  Coordination of Care Radiology  Education Method Verbal  Support Groups/Services Friends and Family  Time Spent with Patient 24

## 2021-06-05 ENCOUNTER — Other Ambulatory Visit: Payer: Self-pay | Admitting: *Deleted

## 2021-06-05 ENCOUNTER — Telehealth: Payer: Self-pay | Admitting: Emergency Medicine

## 2021-06-05 ENCOUNTER — Encounter: Payer: Self-pay | Admitting: *Deleted

## 2021-06-05 DIAGNOSIS — C3490 Malignant neoplasm of unspecified part of unspecified bronchus or lung: Secondary | ICD-10-CM

## 2021-06-05 DIAGNOSIS — R918 Other nonspecific abnormal finding of lung field: Secondary | ICD-10-CM

## 2021-06-05 DIAGNOSIS — Z7189 Other specified counseling: Secondary | ICD-10-CM

## 2021-06-05 NOTE — Progress Notes (Signed)
Pathology results reviewed with Dr Marin Olp. He would like to see patient tomorrow.  ? ?Called patient and scheduled her to see Dr Marin Olp. She is aware of appointment date, time and location.  ? ?Oncology Nurse Navigator Documentation ? ?Oncology Nurse Navigator Flowsheets 06/05/2021  ?Abnormal Finding Date -  ?Confirmed Diagnosis Date 06/02/2021  ?Diagnosis Status Confirmed Diagnosis Complete  ?Navigator Follow Up Date: 06/06/2021  ?Navigator Follow Up Reason: Follow-up Appointment  ?Navigator Location [facility]  ?Referral Date to RadOnc/MedOnc -  ?Navigator Encounter Type Pathology Review;Telephone  ?Telephone Appt Confirmation/Clarification;Outgoing Call  ?Patient Visit Type MedOnc  ?Treatment Phase Pre-Tx/Tx Discussion  ?Barriers/Navigation Needs Coordination of Care;Education  ?Education Other  ?Interventions Coordination of Care;Education  ?Acuity Level 2-Minimal Needs (1-2 Barriers Identified)  ?Referrals -  ?Coordination of Care Appts  ?Education Method Verbal  ?Support Groups/Services Friends and Family  ?Time Spent with Patient 30  ?  ?

## 2021-06-05 NOTE — Telephone Encounter (Signed)
Spoke with the patient by phone to discuss bronchoscopy results.  Biopsies show small cell lung cancer.  We will forward this information to Dr. Marin Olp.  The patient is to follow-up with Dr. Marin Olp to plan next steps in therapy.  She will work on making an appointment with him. ?

## 2021-06-06 ENCOUNTER — Other Ambulatory Visit: Payer: Self-pay

## 2021-06-06 ENCOUNTER — Encounter: Payer: Self-pay | Admitting: Hematology & Oncology

## 2021-06-06 ENCOUNTER — Inpatient Hospital Stay: Payer: PPO | Attending: Hematology & Oncology

## 2021-06-06 ENCOUNTER — Inpatient Hospital Stay: Payer: PPO | Admitting: Hematology & Oncology

## 2021-06-06 ENCOUNTER — Encounter: Payer: Self-pay | Admitting: *Deleted

## 2021-06-06 ENCOUNTER — Other Ambulatory Visit: Payer: PPO

## 2021-06-06 DIAGNOSIS — C3490 Malignant neoplasm of unspecified part of unspecified bronchus or lung: Secondary | ICD-10-CM

## 2021-06-06 DIAGNOSIS — Z79899 Other long term (current) drug therapy: Secondary | ICD-10-CM | POA: Insufficient documentation

## 2021-06-06 DIAGNOSIS — R5383 Other fatigue: Secondary | ICD-10-CM | POA: Diagnosis not present

## 2021-06-06 DIAGNOSIS — R531 Weakness: Secondary | ICD-10-CM | POA: Diagnosis not present

## 2021-06-06 DIAGNOSIS — Z5112 Encounter for antineoplastic immunotherapy: Secondary | ICD-10-CM | POA: Insufficient documentation

## 2021-06-06 DIAGNOSIS — C7931 Secondary malignant neoplasm of brain: Secondary | ICD-10-CM | POA: Diagnosis not present

## 2021-06-06 DIAGNOSIS — C778 Secondary and unspecified malignant neoplasm of lymph nodes of multiple regions: Secondary | ICD-10-CM

## 2021-06-06 DIAGNOSIS — M549 Dorsalgia, unspecified: Secondary | ICD-10-CM | POA: Insufficient documentation

## 2021-06-06 DIAGNOSIS — Z885 Allergy status to narcotic agent status: Secondary | ICD-10-CM | POA: Insufficient documentation

## 2021-06-06 DIAGNOSIS — C3412 Malignant neoplasm of upper lobe, left bronchus or lung: Secondary | ICD-10-CM | POA: Diagnosis not present

## 2021-06-06 DIAGNOSIS — C787 Secondary malignant neoplasm of liver and intrahepatic bile duct: Secondary | ICD-10-CM

## 2021-06-06 DIAGNOSIS — Z5111 Encounter for antineoplastic chemotherapy: Secondary | ICD-10-CM | POA: Insufficient documentation

## 2021-06-06 DIAGNOSIS — Z5189 Encounter for other specified aftercare: Secondary | ICD-10-CM | POA: Insufficient documentation

## 2021-06-06 DIAGNOSIS — R918 Other nonspecific abnormal finding of lung field: Secondary | ICD-10-CM

## 2021-06-06 DIAGNOSIS — C3400 Malignant neoplasm of unspecified main bronchus: Secondary | ICD-10-CM | POA: Insufficient documentation

## 2021-06-06 DIAGNOSIS — Z7189 Other specified counseling: Secondary | ICD-10-CM

## 2021-06-06 HISTORY — DX: Malignant neoplasm of upper lobe, left bronchus or lung: C34.12

## 2021-06-06 HISTORY — DX: Secondary and unspecified malignant neoplasm of lymph nodes of multiple regions: C77.8

## 2021-06-06 HISTORY — DX: Malignant neoplasm of unspecified part of unspecified bronchus or lung: C34.90

## 2021-06-06 LAB — CBC WITH DIFFERENTIAL (CANCER CENTER ONLY)
Abs Immature Granulocytes: 0.04 10*3/uL (ref 0.00–0.07)
Basophils Absolute: 0 10*3/uL (ref 0.0–0.1)
Basophils Relative: 0 %
Eosinophils Absolute: 0.1 10*3/uL (ref 0.0–0.5)
Eosinophils Relative: 2 %
HCT: 37.3 % (ref 36.0–46.0)
Hemoglobin: 12.6 g/dL (ref 12.0–15.0)
Immature Granulocytes: 0 %
Lymphocytes Relative: 18 %
Lymphs Abs: 1.7 10*3/uL (ref 0.7–4.0)
MCH: 30.5 pg (ref 26.0–34.0)
MCHC: 33.8 g/dL (ref 30.0–36.0)
MCV: 90.3 fL (ref 80.0–100.0)
Monocytes Absolute: 1.1 10*3/uL — ABNORMAL HIGH (ref 0.1–1.0)
Monocytes Relative: 11 %
Neutro Abs: 6.4 10*3/uL (ref 1.7–7.7)
Neutrophils Relative %: 69 %
Platelet Count: 236 10*3/uL (ref 150–400)
RBC: 4.13 MIL/uL (ref 3.87–5.11)
RDW: 12.8 % (ref 11.5–15.5)
WBC Count: 9.4 10*3/uL (ref 4.0–10.5)
nRBC: 0 % (ref 0.0–0.2)

## 2021-06-06 LAB — CMP (CANCER CENTER ONLY)
ALT: 14 U/L (ref 0–44)
AST: 17 U/L (ref 15–41)
Albumin: 3.8 g/dL (ref 3.5–5.0)
Alkaline Phosphatase: 83 U/L (ref 38–126)
Anion gap: 8 (ref 5–15)
BUN: 17 mg/dL (ref 8–23)
CO2: 30 mmol/L (ref 22–32)
Calcium: 9.6 mg/dL (ref 8.9–10.3)
Chloride: 97 mmol/L — ABNORMAL LOW (ref 98–111)
Creatinine: 0.68 mg/dL (ref 0.44–1.00)
GFR, Estimated: >60 mL/min (ref >60)
Glucose, Bld: 215 mg/dL — ABNORMAL HIGH (ref 70–99)
Potassium: 4.1 mmol/L (ref 3.5–5.1)
Sodium: 135 mmol/L (ref 135–145)
Total Bilirubin: 0.4 mg/dL (ref 0.3–1.2)
Total Protein: 7.5 g/dL (ref 6.5–8.1)

## 2021-06-06 LAB — LACTATE DEHYDROGENASE: LDH: 208 U/L — ABNORMAL HIGH (ref 98–192)

## 2021-06-06 MED ORDER — OXYCODONE-ACETAMINOPHEN 7.5-325 MG PO TABS: 1.0000 | ORAL_TABLET | Freq: Four times a day (QID) | ORAL | 0 refills | Status: DC | PRN

## 2021-06-06 MED ORDER — FENTANYL 12 MCG/HR TD PT72
1.0000 | MEDICATED_PATCH | TRANSDERMAL | 0 refills | Status: DC
Start: 2021-06-06 — End: 2022-02-26

## 2021-06-06 NOTE — Addendum Note (Signed)
Addended by: [name] on: 06/06/2021 06:56 PM ? ? Modules accepted: Orders ? ?

## 2021-06-06 NOTE — Progress Notes (Signed)
Hematology and Oncology Follow Up Visit  [name] [id number] 1950/12/25 71 y.o. 06/06/2021   Principle Diagnosis:  Small cell lung cancer -- Extensive stage  Current Therapy:   Carbo/VP-16/Tecentriq -- start cycle #1 on 06/11/2021 Xgeva 120 mg subcu every 3 months     Interim History:  [name] is back for follow-up.  This is her second office visit.  We do have a diagnosis for her.  She underwent bronchoscopy and had biopsies done.  This was done on 06/02/2021.  The pathology report ([id number]) showed small cell lung cancer.  We did go ahead and do a PET scan on her.  She does have extensive disease.  She has disease in her lymph nodes.  She has disease in her liver.  She has disease in her bones.  She is complaining of back discomfort.  There is no neurological issues.  I think she probably need some kind of long-term pain control.  She is on Percocet right now.  I will send in a prescription for Duragesic patch.  She is due for a MRI of the brain next week.  She is still in good shape.  I think she would benefit from chemo immunotherapy.  As such, I think that we could certainly consider utilizing carboplatinum/VP-16/Tecentriq.  I think this worked very well.  I had a long talk with she and her family.  I told her that this is stage IV disease.  As such, we can treat it but not cure it.  I think the chance of success should be over 90%.  Currently, I would say performance status is ECOG 1.  Medications:  Current Outpatient Medications:    acetaminophen (TYLENOL) 500 MG tablet, Take 1,000 mg by mouth every 6 (six) hours as needed for moderate pain., Disp: , Rfl:    albuterol (VENTOLIN HFA) 108 (90 Base) MCG/ACT inhaler, Inhale 2 puffs into the lungs every 6 (six) hours as needed for wheezing or shortness of breath., Disp: 8 g, Rfl: 2   ALPRAZolam (XANAX) 0.5 MG tablet, Take 1 tablet (0.5 mg total) by mouth daily., Disp: , Rfl:    blood glucose meter kit and supplies KIT, Use  daily, Disp: 1 each, Rfl: 0   Blood Glucose Monitoring Suppl (ONE TOUCH ULTRA 2) w/Device KIT, Check blood sugar as directed. Dx:E11.65, Disp: 1 kit, Rfl: 0   Blood Glucose Monitoring Suppl (ONETOUCH VERIO FLEX SYSTEM) w/Device KIT, USE AS DIRECTED TO CHECK BLOOD SUGAR FOUR TIMES A DAY, Disp: 1 kit, Rfl: 0   FLUoxetine (PROZAC) 20 MG capsule, Take 1 capsule (20 mg total) by mouth daily., Disp: 90 capsule, Rfl: 3   glucose blood (ONETOUCH ULTRA) test strip, USE TO CHECK BLOOD SUGAR FOUR TIMES A DAY AS DIRECTED, Disp: 400 strip, Rfl: 1   hydrochlorothiazide (HYDRODIURIL) 25 MG tablet, TAKE ONE TABLET BY MOUTH DAILY, Disp: 90 tablet, Rfl: 1   insulin glargine (LANTUS SOLOSTAR) 100 UNIT/ML Solostar Pen, Inject 30 Units into the skin daily. HS, Disp: , Rfl:    Insulin Pen Needle (NOVOTWIST) 32G X 5 MM MISC, Use daily at Bedtime with Lantus, Disp: 100 each, Rfl: 0   levothyroxine (SYNTHROID) 75 MCG tablet, Take 75 mcg by mouth daily before breakfast., Disp: , Rfl:    meloxicam (MOBIC) 15 MG tablet, TAKE ONE TABLET BY MOUTH DAILY, Disp: 90 tablet, Rfl: 0   metFORMIN (GLUCOPHAGE-XR) 500 MG 24 hr tablet, Take 1 tablet (500 mg total) by mouth 2 (two) times daily., Disp: ,  Rfl:    oxyCODONE-acetaminophen (PERCOCET) 7.5-325 MG tablet, Take 1 tablet by mouth every 6 (six) hours as needed for severe pain., Disp: 90 tablet, Rfl: 0   traMADol (ULTRAM) 50 MG tablet, Take 100 mg by mouth every 6 (six) hours as needed for moderate pain., Disp: , Rfl:    TRULICITY 8.31 DV/7.6HY SOPN, Inject 0.75 mg into the skin every Saturday., Disp: , Rfl:    valACYclovir (VALTREX) 1000 MG tablet, TAKE ONE TABLET BY MOUTH DAILY AS NEEDED, Disp: 30 tablet, Rfl: 0  Current Facility-Administered Medications:    ipratropium-albuterol (DUONEB) 0.5-2.5 (3) MG/3ML nebulizer solution 3 mL, 3 mL, Nebulization, Q6H, Lowne Chase, Yvonne R, DO  Allergies:  Allergies  Allergen Reactions   Crestor [Rosuvastatin Calcium] Other (See Comments)     Leg cramps   Hydrocodone-Acetaminophen Other (See Comments)    Nightmares, skin crawling   Lyrica [Pregabalin]     Hallucinations     Past Medical History, Surgical history, Social history, and Family History were reviewed and updated.  Review of Systems: Review of Systems  Constitutional: Negative.  Negative for appetite change.  HENT:  Negative.    Eyes: Negative.   Respiratory: Negative.    Cardiovascular: Negative.   Gastrointestinal: Negative.   Endocrine: Negative.   Genitourinary: Negative.    Musculoskeletal:  Positive for back pain.  Skin: Negative.   Neurological: Negative.   Hematological: Negative.   Psychiatric/Behavioral: Negative.    All other systems reviewed and are negative.  Physical Exam:  height is 4' 11" (1.499 m) and weight is 120 lb (54.4 kg). Her oral temperature is 98.7 F (37.1 C). Her blood pressure is 128/85 and her pulse is 106 (abnormal). Her respiration is 18 and oxygen saturation is 96%.   Wt Readings from Last 3 Encounters:  06/06/21 120 lb (54.4 kg)  05/28/21 121 lb 3.2 oz (55 kg)  05/23/21 120 lb (54.4 kg)    Physical Exam Vitals reviewed.  HENT:     Head: Normocephalic and atraumatic.  Eyes:     Pupils: Pupils are equal, round, and reactive to light.  Cardiovascular:     Rate and Rhythm: Normal rate and regular rhythm.     Heart sounds: Normal heart sounds.  Pulmonary:     Effort: Pulmonary effort is normal.     Breath sounds: Normal breath sounds.  Abdominal:     General: Bowel sounds are normal.     Palpations: Abdomen is soft.  Musculoskeletal:        General: No tenderness or deformity. Normal range of motion.     Cervical back: Normal range of motion.  Lymphadenopathy:     Cervical: No cervical adenopathy.  Skin:    General: Skin is warm and dry.     Findings: No erythema or rash.  Neurological:     Mental Status: She is alert and oriented to person, place, and time.  Psychiatric:        Behavior: Behavior  normal.        Thought Content: Thought content normal.        Judgment: Judgment normal.     Lab Results  Component Value Date   WBC 9.4 06/06/2021   HGB 12.6 06/06/2021   HCT 37.3 06/06/2021   MCV 90.3 06/06/2021   PLT 236 06/06/2021     Chemistry      Component Value Date/Time   NA 135 06/06/2021 1355   K 4.1 06/06/2021 1355   CL 97 (L) 06/06/2021 1355  CO2 30 06/06/2021 1355   BUN 17 06/06/2021 1355   CREATININE 0.68 06/06/2021 1355   CREATININE 0.88 05/08/2021 1126      Component Value Date/Time   CALCIUM 9.6 06/06/2021 1355   ALKPHOS 83 06/06/2021 1355   AST 17 06/06/2021 1355   ALT 14 06/06/2021 1355   BILITOT 0.4 06/06/2021 1355      Impression and Plan: Ms. Chaires is a very nice 71 year old white female.  She has extensive stage small cell lung cancer.  I think she is in good shape.  I do not see any reason why we cannot try to be aggressive with her.  Again I do think she would benefit from chemo immunotherapy.  Even if she has metastasis to the brain, I still think we can treat these effectively unless she has a large lesion that might be causing edema and midline shift.  I did went over side effects of treatment.  I told her that she would likely lose her hair.  I told her about the white cell count and platelet count going down.  She will need to have Neulasta after treatment.  I explained to her about the possibility of diarrhea with the immunotherapy.  She may have a rash with immunotherapy.  Fatigue and weakness could certainly be a possibility with her treatment.  Again, I think that success should be 90% or greater.  I will give her 2 cycles of treatment and then I would plan for follow-up PET scan to see how she has responded.  I would probably give her 6 cycles of treatment.  After that I would use maintenance immunotherapy.  I know there are some controversy as to using radiation therapy to the primary.  Again, I think she has a good response  systemically, we could consider radiotherapy to the primary.  I also think there might be some controversy as to whether or not she would need PCI despite having metastatic disease.  I think if she has a good response systemically, then we will could consider PCI.  We will try to get the Port-A-Cath put in next week.  I would like to try to get started with treatment on 8 March.  We may have to put her on prophylactic antibiotics.  She also may need to be on prophylactic allopurinol in case there is tumor lysis.  I will use Xgeva to help with weight to her bone metastasis.  I will dose her every 3 months.  I would like to see her back about 10 days after treatment so we can see how her blood counts look.   [name], MD 3/3/20233:44 PM

## 2021-06-06 NOTE — Progress Notes (Signed)
Patient here to discuss treatment options after biopsy. Plan will be for chemo/immunotherapy. Patient needs port and Dr Marin Olp would like to start treatment next week, however port can't be placed until 06/13/2021. She will also need chemo education. ? ?Port placement cant be scheduled after 4p. Will be scheduled next week. Chemo educator notified regarding need for education appointment.  ? ?Oncology Nurse Navigator Documentation ? ?Oncology Nurse Navigator Flowsheets 06/06/2021  ?Abnormal Finding Date -  ?Confirmed Diagnosis Date -  ?Diagnosis Status -  ?Navigator Follow Up Date: 06/10/2021  ?Navigator Follow Up Reason: Appointment Review  ?Navigator Location [facility]  ?Referral Date to RadOnc/MedOnc -  ?Navigator Encounter Type Follow-up Appt  ?Telephone -  ?Patient Visit Type MedOnc  ?Treatment Phase Pre-Tx/Tx Discussion  ?Barriers/Navigation Needs Coordination of Care;Education  ?Education -  ?Interventions Coordination of Care  ?Acuity Level 2-Minimal Needs (1-2 Barriers Identified)  ?Referrals -  ?Coordination of Care Radiology  ?Education Method -  ?Support Groups/Services Friends and Family  ?Time Spent with Patient 30  ?  ?

## 2021-06-06 NOTE — Progress Notes (Signed)
START ON PATHWAY REGIMEN - Small Cell Lung ? ? ?  Cycles 1 through 4, every 21 days: ?    Atezolizumab  ?    Carboplatin  ?    Etoposide  ?  Cycles 5 and beyond, every 21 days: ?    Atezolizumab  ? ?**Always confirm dose/schedule in your pharmacy ordering system** ? ?Patient Characteristics: ?Newly Diagnosed, Preoperative or Nonsurgical Candidate (Clinical Staging), First Line, Extensive Stage ?Therapeutic Status: Newly Diagnosed, Preoperative or Nonsurgical Candidate (Clinical Staging) ?AJCC T Category: cT2a ?AJCC N Category: cN3 ?AJCC M Category: pM1c ?AJCC 8 Stage Grouping: IVB ?Stage Classification: Extensive ? ?Intent of Therapy: ?Non-Curative / Palliative Intent, Discussed with Patient ?

## 2021-06-09 ENCOUNTER — Other Ambulatory Visit: Payer: Self-pay | Admitting: *Deleted

## 2021-06-09 ENCOUNTER — Telehealth: Payer: Self-pay | Admitting: Hematology & Oncology

## 2021-06-09 ENCOUNTER — Telehealth: Payer: Self-pay | Admitting: *Deleted

## 2021-06-09 ENCOUNTER — Inpatient Hospital Stay: Payer: PPO

## 2021-06-09 DIAGNOSIS — C3412 Malignant neoplasm of upper lobe, left bronchus or lung: Secondary | ICD-10-CM

## 2021-06-09 MED ORDER — LIDOCAINE-PRILOCAINE 2.5-2.5 % EX CREA: TOPICAL_CREAM | CUTANEOUS | 3 refills | Status: DC

## 2021-06-09 MED ORDER — PROCHLORPERAZINE MALEATE 10 MG PO TABS: 10.0000 mg | ORAL_TABLET | Freq: Four times a day (QID) | ORAL | 1 refills | Status: DC | PRN

## 2021-06-09 MED ORDER — ONDANSETRON HCL 8 MG PO TABS: 8.0000 mg | ORAL_TABLET | Freq: Two times a day (BID) | ORAL | 1 refills | Status: DC | PRN

## 2021-06-09 NOTE — Telephone Encounter (Signed)
Called to schedule per 3/3 los , left voicemail  ?

## 2021-06-09 NOTE — Telephone Encounter (Signed)
This nurse called Gaynelle Adu, daughter, and updated her on her mom's schedule for this week. Wednesday and Thursday stay the same. On Friday, March, 10th, your mom needs to be at [facility] [place] admitting at 6:30am for a 7:00 am MRI of brain. At 07:30, preop for port-a-cath placement at 9:30. After being discharged from port procedure, she needs to come to the [facility] at 2:00 pm for the third day of treatment. Neoma Laming verbalized understanding and will tell her mom about the appointments.   ?

## 2021-06-09 NOTE — Telephone Encounter (Signed)
This nurse called patient and reviewed appointment dates and times for this week with her daughter. Patient is OK with coming in for Chemo Education at 9:00 am on Wednesday, March 8th. She understands that the first treatment will be through a PIV not a port-a-cath. The port-a-cath will be placed on Friday, March, 10th at 9:30am. You are to arrive at [facility] at 7:30 am, you must have a driver, and NPO after midnight. The daughter verbalized understanding of her schedule.   ?

## 2021-06-10 ENCOUNTER — Other Ambulatory Visit: Payer: Self-pay | Admitting: Family Medicine

## 2021-06-10 ENCOUNTER — Encounter: Payer: Self-pay | Admitting: *Deleted

## 2021-06-10 DIAGNOSIS — F419 Anxiety disorder, unspecified: Secondary | ICD-10-CM

## 2021-06-10 NOTE — H&P (Signed)
Chief Complaint: Patient was seen in consultation today for tunneled catheter with port placement at the request of Ennever,Peter R  Referring Physician(s): Ennever,Peter R  Supervising Physician: Irish Lack  Patient Status: [facility] - Out-pt  History of Present Illness: [name] is a 71 y.o. 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  Patient referred by Dr. Myna Hidalgo for Port-A-Cath placement to begin chemotherapy.  Past Medical History:  Diagnosis Date   Arthritis    Cancer (HCC)    Diabetes mellitus    Esophagitis    Goals of care, counseling/discussion 05/23/2021   Hiatal hernia    Hx of colonic polyps ssp and adenoma 03/06/2018   Hypertension    Hypothyroidism    OSA on CPAP    Sleep apnea    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HCC) 06/06/2021   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HCC) 06/06/2021   Small cell lung cancer, left upper lobe (HCC) 06/06/2021   Thyroid disease    TMJ (sprain of temporomandibular joint)     Past Surgical History:  Procedure Laterality Date   ABDOMINAL HYSTERECTOMY     BACK SURGERY     x3   BACK SURGERY     x 5 , 2 neck surgeries, 2 lower back surgeries   BRONCHIAL BIOPSY  06/02/2021   Procedure: BRONCHIAL BIOPSIES;  Surgeon: Leslye Peer, MD;  Location: [facility] ENDOSCOPY;  Service: Pulmonary;;   BRONCHIAL BRUSHINGS  06/02/2021   Procedure: BRONCHIAL BRUSHINGS;  Surgeon: Leslye Peer, MD;  Location: [facility] ENDOSCOPY;  Service: Pulmonary;;   BRONCHIAL NEEDLE ASPIRATION BIOPSY  06/02/2021   Procedure: BRONCHIAL NEEDLE ASPIRATION BIOPSIES;  Surgeon: Leslye Peer, MD;  Location: [facility] ENDOSCOPY;  Service: Pulmonary;;   CHOLECYSTECTOMY     COLONOSCOPY     ERCP     ESOPHAGOGASTRODUODENOSCOPY     THYROID SURGERY     TONSILECTOMY, ADENOIDECTOMY, BILATERAL MYRINGOTOMY AND TUBES     VIDEO BRONCHOSCOPY  WITH ENDOBRONCHIAL ULTRASOUND Left 06/02/2021   Procedure: VIDEO BRONCHOSCOPY WITH ENDOBRONCHIAL ULTRASOUND;  Surgeon: Leslye Peer, MD;  Location: [facility] ENDOSCOPY;  Service: Pulmonary;  Laterality: Left;    Allergies: Crestor [rosuvastatin calcium], Hydrocodone-acetaminophen, and Lyrica [pregabalin]  Medications: Prior to Admission medications   Medication Sig Start Date End Date Taking? Authorizing Provider  acetaminophen (TYLENOL) 500 MG tablet Take 1,000 mg by mouth every 6 (six) hours as needed for moderate pain.    [provider]  albuterol (VENTOLIN HFA) 108 (90 Base) MCG/ACT inhaler Inhale 2 puffs into the lungs every 6 (six) hours as needed for wheezing or shortness of breath. 04/02/20   Wanda Plump, MD  ALPRAZolam Prudy Feeler) 0.5 MG tablet TAKE ONE TABLET BY MOUTH THREE TIMES A DAY AS NEEDED 06/10/21   Seabron Spates R, DO  blood glucose meter kit and supplies KIT Use daily 05/19/21   Zola Button, Grayling Congress, DO  Blood Glucose Monitoring Suppl (ONE TOUCH ULTRA 2) w/Device KIT Check blood sugar as directed. Dx:E11.65 09/26/18   Seabron Spates R, DO  Blood Glucose Monitoring Suppl (ONETOUCH VERIO FLEX SYSTEM) w/Device KIT USE AS DIRECTED TO CHECK BLOOD SUGAR FOUR TIMES A DAY 11/30/19   Zola Button, Grayling Congress, DO  fentaNYL (DURAGESIC) 12 MCG/HR Place 1 patch onto the skin every 3 (three) days. 06/06/21   Josph Macho, MD  FLUoxetine (PROZAC) 20 MG capsule Take 1 capsule (20 mg total) by mouth daily. 02/21/21   Seabron Spates R, DO  glucose blood (ONETOUCH ULTRA) test strip USE TO CHECK BLOOD SUGAR FOUR TIMES A DAY AS DIRECTED 11/01/20   Zola Button, Grayling Congress, DO  hydrochlorothiazide (HYDRODIURIL) 25 MG tablet TAKE ONE TABLET BY MOUTH DAILY 12/18/20   Zola Button, Grayling Congress, DO  insulin glargine (LANTUS SOLOSTAR) 100 UNIT/ML Solostar Pen Inject 30 Units into the skin daily. HS 06/02/21   Leslye Peer, MD  Insulin Pen Needle (NOVOTWIST) 32G X 5 MM MISC Use daily at Bedtime with  Lantus 02/17/12   Zola Button, Grayling Congress, DO  levothyroxine (SYNTHROID) 75 MCG tablet Take 75 mcg by mouth daily before breakfast.    [provider]  lidocaine-prilocaine (EMLA) cream Apply to affected area once 06/09/21   Josph Macho, MD  meloxicam (MOBIC) 15 MG tablet TAKE ONE TABLET BY MOUTH DAILY 02/18/21   Zola Button, Grayling Congress, DO  metFORMIN (GLUCOPHAGE-XR) 500 MG 24 hr tablet Take 1 tablet (500 mg total) by mouth 2 (two) times daily. 06/02/21   Leslye Peer, MD  ondansetron (ZOFRAN) 8 MG tablet Take 1 tablet (8 mg total) by mouth 2 (two) times daily as needed for refractory nausea / vomiting. Start on day 3 after carboplatin chemo. 06/09/21   Josph Macho, MD  oxyCODONE-acetaminophen (PERCOCET) 7.5-325 MG tablet Take 1 tablet by mouth every 6 (six) hours as needed for severe pain. 06/06/21   Josph Macho, MD  prochlorperazine (COMPAZINE) 10 MG tablet Take 1 tablet (10 mg total) by mouth every 6 (six) hours as needed (Nausea or vomiting). 06/09/21   Josph Macho, MD  TRULICITY 0.75 MG/0.5ML SOPN Inject 0.75 mg into the skin every Saturday. 03/14/21   [provider]  valACYclovir (VALTREX) 1000 MG tablet TAKE ONE TABLET BY MOUTH DAILY AS NEEDED 03/27/20   Donato Schultz, DO     Family History  Problem Relation Age of Onset   Stroke Mother    Alcohol abuse Mother    Stroke Maternal Grandmother    Heart disease Maternal Grandfather    Breast cancer Cousin    Breast cancer Daughter    Colon cancer Neg Hx    Esophageal cancer Neg Hx    Rectal cancer Neg Hx    Stomach cancer Neg Hx     Social History   Socioeconomic History   Marital status: Married    Spouse name: Not on file   Number of children: Not on file   Years of education: Not on file   Highest education level: Not on file  Occupational History   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    Employer: [employer]  Tobacco Use   Smoking status: Former    Years: 48.00    Types: Cigarettes     Quit date: 02/01/2017    Years since quitting: 4.3   Smokeless tobacco: Never  Vaping Use   Vaping Use: Never used  Substance and Sexual Activity   Alcohol use: Never   Drug use: No   Sexual activity: Yes    Partners: Male  Other Topics Concern   Not on file  Social History Narrative   Exercise--  3 flts steps a day for 8 hours   Social Determinants of Health   Financial Resource Strain: Not on file  Food Insecurity: Not on file  Transportation Needs: Not on file  Physical Activity: Not  on file  Stress: Not on file  Social Connections: Not on file     Review of Systems: A 12 point ROS discussed and pertinent positives are indicated in the HPI above.  All other systems are negative.  Review of Systems  Constitutional:  Positive for appetite change. Negative for chills and fever.  Eyes:  Negative for visual disturbance.  Respiratory:  Positive for cough. Negative for shortness of breath.   Cardiovascular:  Negative for chest pain and leg swelling.  Gastrointestinal:  Positive for vomiting. Negative for abdominal pain and nausea.  Neurological:  Negative for dizziness, light-headedness and headaches.   Vital Signs: BP (!) 158/97   Pulse 88   Temp 98.3 F (36.8 C) (Oral)   Resp 16   SpO2 98%   Physical Exam Constitutional:      Appearance: She is ill-appearing.  HENT:     Head: Normocephalic and atraumatic.     Mouth/Throat:     Mouth: Mucous membranes are dry.     Pharynx: Oropharynx is clear.  Eyes:     Extraocular Movements: Extraocular movements intact.     Pupils: Pupils are equal, round, and reactive to light.  Cardiovascular:     Rate and Rhythm: Normal rate and regular rhythm.     Pulses: Normal pulses.     Heart sounds: Normal heart sounds.  Pulmonary:     Effort: Pulmonary effort is normal. No respiratory distress.     Breath sounds: No stridor. Rhonchi present. No wheezing or rales.     Comments: Rhonchi to LUL Abdominal:     General: Bowel  sounds are normal. There is no distension.     Palpations: Abdomen is soft.     Tenderness: There is no abdominal tenderness. There is no guarding.  Musculoskeletal:     Right lower leg: No edema.     Left lower leg: No edema.  Skin:    General: Skin is warm and dry.  Neurological:     Mental Status: She is alert and oriented to person, place, and time.  Psychiatric:        Mood and Affect: Mood normal.        Behavior: Behavior normal.        Thought Content: Thought content normal.        Judgment: Judgment normal.    Imaging: MR Brain W Wo Contrast  Result Date: 06/13/2021 CLINICAL DATA:  Staging new diagnosis of non-small-cell lung cancer EXAM: MRI HEAD WITHOUT AND WITH CONTRAST TECHNIQUE: Multiplanar, multiecho pulse sequences of the brain and surrounding structures were obtained without and with intravenous contrast. CONTRAST:  5mL GADAVIST GADOBUTROL 1 MMOL/ML IV SOLN COMPARISON:  CT head 11/30/2017 FINDINGS: Brain: There is a rounded focus of T2 hyperintensity with a rim of FLAIR hyperintensity in the left centrum semiovale measuring approximally 7 mm. There is no associated enhancement. This finding was not present on the CT head from 2019 common but favored to reflect a remote lacunar infarct. There are no enhancing lesions. There is no evidence of acute intracranial hemorrhage, extra-axial fluid collection, or acute infarct. Background parenchymal volume is normal. The ventricles are normal in size. Patchy foci of FLAIR signal abnormality in the subcortical and periventricular white matter likely reflects sequela of mild chronic white matter microangiopathy. There is an additional small remote infarct in the left cerebellar hemisphere. There is no mass effect or midline shift. Vascular: Normal flow voids. Skull and upper cervical spine: There is no suspicious marrow signal  abnormality. Postsurgical changes are noted in the upper cervical spine. Sinuses/Orbits: The paranasal sinuses are  clear. The globes and orbits are unremarkable. Other: None. IMPRESSION: 1. No evidence of intracranial metastatic disease. 2. 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 Electronically Signed   By: Lesia Hausen M.D.   On: 06/13/2021 08:29   CT CHEST ABDOMEN PELVIS W CONTRAST  Result Date: 05/14/2021 CLINICAL DATA:  A 71 year old female presents for evaluation of metastatic disease in the setting of abnormal spinal MRI. EXAM: CT CHEST, ABDOMEN, AND PELVIS WITH CONTRAST TECHNIQUE: Multidetector CT imaging of the chest, abdomen and pelvis was performed following the standard protocol during bolus administration of intravenous contrast. RADIATION DOSE REDUCTION: This exam was performed according to the departmental dose-optimization program which includes automated exposure control, adjustment of the mA and/or kV according to patient size and/or use of iterative reconstruction technique. CONTRAST:  OMNIPAQUE IOHEXOL 300 MG/ML  SOLN COMPARISON:  November 25, 2020. FINDINGS: CT CHEST FINDINGS Cardiovascular: Calcified and noncalcified atherosclerosis of the thoracic aorta. No aneurysmal dilation. Mild dilation of the central pulmonary vasculature. Otherwise unremarkable appearance of central pulmonary vessels. Normal heart size. Signs of coronary calcification. No substantial volume of pericardial fluid, small volume pericardial effusion. Mediastinum/Nodes: Thickening of the distal esophagus, nonspecific but present currently and associated with bulky mediastinal adenopathy. LEFT perihilar adenopathy and or central lung mass in the LEFT chest abutting pulmonary artery, anterior aorta and lateral esophagus (image 32/2) 4.5 x 3.9 cm. AP window adenopathy 1.9 cm short axis (image 23/2) RIGHT paratracheal adenopathy (image 22/2) 3.1 cm. Subcarinal adenopathy (image 30/2) 1.9 cm short axis. No RIGHT hilar  adenopathy, thoracic inlet adenopathy or axillary lymphadenopathy. Lungs/Pleura: Juxta hilar mass or nodal disease in the inferior LEFT chest narrows lower lobe bronchus and LEFT mainstem bronchus. Signs of pleural nodularity evident over the LEFT hemidiaphragm (image 127/4) 7 mm nodules overlie the posterior LEFT hemidiaphragm. Subtle dependent nodularity may rib represent a mixture of atelectasis and tiny pleural based nodules in the inferior LEFT chest (image 119/4) Fissural thickening along the major fissure in the LEFT chest 3-4 mm greatest thickness. RIGHT lung is clear. Musculoskeletal: See below for full musculoskeletal details. Signs of skeletal metastatic disease also involving the thoracic spine. CT ABDOMEN PELVIS FINDINGS Hepatobiliary: Post cholecystectomy. Stable chronic prominence of the common bile duct. No focal, suspicious hepatic lesion. Signs of hepatic steatosis. Pancreas: Normal, without mass, inflammation or ductal dilatation. Spleen: Hypodense lesion in the cephalad spleen relative to surrounding splenic tissue is enlarged since previous imaging and is indeterminate (image 50/2) this measures 7 mm. Density values greater than expected for cyst. Adrenals/Urinary Tract: Adrenal glands are unremarkable aside from mild thickening without discrete lesion. Cyst in the upper pole the RIGHT kidney. No perivesical stranding. No suspicious renal lesion or hydronephrosis. No perivesical stranding. Under distension of the urinary bladder mildly limits assessment. Stomach/Bowel: Distal esophageal thickening in the setting of small hiatal hernia. Esophageal thickening is circumferential and moderate as outlined above. No signs of bowel obstruction or acute bowel process. Appendix is normal. Sigmoid diverticulosis and diverticular disease. Vascular/Lymphatic: Aortic atherosclerosis. No sign of aneurysm. Smooth contour of the IVC. There is no gastrohepatic or hepatoduodenal ligament lymphadenopathy. No  retroperitoneal or mesenteric lymphadenopathy. No pelvic sidewall lymphadenopathy. Reproductive: Post hysterectomy without signs of adnexal mass. Other: No ascites or peritoneal nodularity. Musculoskeletal: Lytic lesions in the spine compatible with metastatic disease. Most notably a  lesion destroying the LEFT pedicle and posterior aspect of the vertebral body by late Ng the posterior cortex at the T10 level. Inferior endplate pathologic fracture with extension also into the facets on the LEFT measuring approximately 2.2 x 1.7 cm (image 42/2) central canal extension with mild mass effect suggested along the LEFT anterolateral aspect of the thecal sac approximately 4 mm posterior extension of soft tissue into the central canal. Other subtle areas of heterogeneity and lucent lesion suggesting more extensive involvement of the thoracic spine that is evident on CT a visible lesion is noted at the T7 level (image 92/6) this is also centered in the posterior aspect of the vertebral body oriented towards the pedicle. L1 lytic focus (image 56/2) demonstrated on previous lumbar spine MRI measuring 12 mm. L3 lytic process with pathologic fracture of the superior endplate of L3 with minimal loss of height and extension of soft tissue along the LEFT anterolateral vertebral body at L3 measures 2.2 x 2.3 cm also similar to previous imaging. Other lumbar spinal lesions outlined on the previous MRI are not as well seen on CT. LEFT superior acetabular lesion (image 100/2) 14 mm. Signs of lumbar spinal fusion as before spanning L4 through S1. IMPRESSION: 1. LEFT perihilar masslike area with extensive mediastinal adenopathy most suggestive of thoracic neoplasm likely lung cancer. Would also consider the possibility of esophageal neoplasm given distal esophageal thickening. 2. Signs of pleural disease in the LEFT chest with pleural nodularity overlying the LEFT hemidiaphragm. No current evidence of effusion. 3. Signs of bony metastatic  disease in both the thoracic and lumbar spine. Lumbar imaging with MRI showed metastatic disease in the lumbar spine to better advantage. T10 vertebral metastasis that was partially imaged on the prior MRI does extend into the central canal and destroys posterior aspect of LEFT pedicle and vertebral body. Other thoracic and lumbar metastatic lesions as well as LEFT acetabular lesion as outlined above. Dedicated thoracic spine imaging may be helpful given above findings if not yet performed. 4. Hypodense lesion in the cephalad spleen, enlarged since previous imaging, and is indeterminate. Density does not allow for characterization as a benign process and enlargement in the current context is suspicious. Could consider close attention on follow-up or abdominal MRI as warranted for further evaluation. 5. Signs of hepatic steatosis. 6. Colonic diverticulosis without evidence of acute diverticulitis. 7. Aortic atherosclerosis. Aortic Atherosclerosis (ICD10-I70.0). These results will be called to the ordering clinician or representative by the Radiologist Assistant, and communication documented in the PACS or [employer]. Electronically Signed   By: Donzetta Kohut M.D.   On: 05/14/2021 10:27   NM PET Image Initial (PI) Skull Base To Thigh  Result Date: 05/29/2021 CLINICAL DATA:  Initial treatment strategy for widespread metastatic disease. EXAM: NUCLEAR MEDICINE PET SKULL BASE TO THIGH TECHNIQUE: 5.9 mCi F-18 FDG was injected intravenously. Full-ring PET imaging was performed from the skull base to thigh after the radiotracer. CT data was obtained and used for attenuation correction and anatomic localization. Fasting blood glucose: 1 L5 mg/dl COMPARISON:  CT scan 16/01/9603 FINDINGS: Mediastinal blood pool activity: SUV max 1.94 Liver activity: SUV max NA NECK: No neck mass or hypermetabolic neck adenopathy. Incidental CT findings: none CHEST: Large left upper lobe/hilar mass invading mediastinum is markedly  hypermetabolic with SUV max of 52.04. Associated contralateral mediastinal adenopathy the with large right paratracheal nodal mass having an SUV max of 40.52. Prevascular and subcarinal adenopathy also noted. There is an 8 mm pleural nodule in the left  lower lobe on image number 54/8. This is hypermetabolic with SUV max of 9.79 consistent the pleural metastasis. This 80 second pleural nodule at the left lung base measures 7 mm on image number 59/8 and has an SUV max of 5.42 I do not see any hypermetabolic pulmonary nodules to suggest pulmonary metastatic disease. No breast masses, supraclavicular or axillary lymphadenopathy. Incidental CT findings: Aortic and coronary artery calcifications. ABDOMEN/PELVIS: 18 mm hypermetabolic liver metastasis in segment 8 laterally. SUV max is 22.32. Second metastatic focus in segment 6 has an SUV max of 12.54. There is a soft tissue nodule along the superior margin of the pancreas. This measures 12 mm on image number 105/4 and has an SUV max of 34.50. Pancreatic lesion versus peripancreatic lymph node. No adrenal gland lesions. Incidental CT findings: Scattered aortic calcifications. Colonic diverticulosis. SKELETON: Widespread lytic destructive metastatic bone disease as demonstrated on the prior CT scan and MRI lumbar spine. Extensive involvement of the cervical, thoracic and lumbar spines. Large lytic destructive lesion involving the left aspect of T10 vertebral body with likely some canal compromise. SUV max is 38.75. Additional lesions include the right clavicle, multiple ribs, the sacrum, iliac bones, left acetabulum, right pubic symphysis and subtrochanteric right femur. Incidental CT findings: none IMPRESSION: 1. Large left upper lobe/hilar mass invading the mediastinum likely the patient's primary lung neoplasm. Associated large hypermetabolic prevascular, subcarinal and contralateral right mediastinal lymphadenopathy. 2. Left-sided pleural metastatic nodules. 3. Hepatic  metastatic lesions. 4. Pancreatic purses peripancreatic hypermetabolic lesion. 5. Widespread aggressive/lytic destructive metastatic bone disease. Suspect early canal involvement at T10 and also in the cervical spine. MRI examinations of the cervical and thoracic spine without and with contrast may be helpful for further evaluation. Electronically Signed   By: Rudie Meyer M.D.   On: 05/29/2021 17:36    Labs:  CBC: Recent Labs    02/21/21 1007 05/23/21 1050 06/06/21 1355 06/11/21 0944  WBC 7.8 7.1 9.4 8.8  HGB 13.2 13.5 12.6 13.2  HCT 40.1 41.0 37.3 39.7  PLT 183.0 270 236 259    COAGS: No results for input(s): INR, APTT in the last 8760 hours.  BMP: Recent Labs    05/08/21 1126 05/23/21 1050 06/06/21 1355 06/11/21 0944  NA 137 137 135 135  K 4.5 3.8 4.1 3.6  CL 95* 96* 97* 93*  CO2 30 32 30 30  GLUCOSE 172* 100* 215* 223*  BUN 19 14 17 11   CALCIUM 10.5* 10.4* 9.6 10.2  CREATININE 0.88 0.75 0.68 0.81  GFRNONAA  --  >60 >60 >60    LIVER FUNCTION TESTS: Recent Labs    02/21/21 1007 05/08/21 1126 05/23/21 1050 06/06/21 1355 06/11/21 0944  BILITOT 0.5 0.5 0.4 0.4 0.3  AST 17 15 14* 17 18  ALT 13 13 11 14 13   ALKPHOS 76  --  70 83 88  PROT 7.4 7.9 7.9 7.5 7.9  ALBUMIN 4.1  --  3.9 3.8 4.0    TUMOR MARKERS: No results for input(s): AFPTM, CEA, CA199, CHROMGRNA in the last 8760 hours.  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  Patient referred by Dr. Myna Hidalgo for Port-A-Cath placement to begin chemotherapy.  Pt resting on stretcher. She is A&O, calm and pleasant. She is in no distress.  Pt states she is NPO per order.  No labs needed today.   Risks and benefits of image guided port-a-catheter placement was  discussed with the patient including, but not limited to bleeding, infection, pneumothorax, or fibrin sheath development and need for additional  procedures.  All of the patient's questions were answered, patient is agreeable to proceed. Consent signed and in chart.   Thank you for this interesting consult.  I greatly enjoyed meeting [name] and look forward to participating in their care.  A copy of this report was sent to the requesting provider on this date.  Electronically Signed: Shon Hough, NP 06/13/2021, 9:04 AM   I spent a total of 20 minutes in face to face in clinical consultation, greater than 50% of which was counseling/coordinating care for tunneled catheter with port placement.

## 2021-06-10 NOTE — Progress Notes (Signed)
Patient will begin chemo tomorrow prior to port placement. She will also receive chemo education tomorrow morning. Her port and MRI will be completed Friday and then patient will come into the office Friday afternoon to finish her cycle. Erline Levine RN went over all appointments with daughter yesterday.  ? ?Oncology Nurse Navigator Documentation ? ?Oncology Nurse Navigator Flowsheets 06/10/2021  ?Abnormal Finding Date -  ?Confirmed Diagnosis Date -  ?Diagnosis Status -  ?Navigator Follow Up Date: 06/11/2021  ?Navigator Follow Up Reason: Chemotherapy  ?Navigator Location [facility]  ?Referral Date to RadOnc/MedOnc -  ?Navigator Encounter Type Appt/Treatment Plan Review  ?Telephone -  ?Patient Visit Type MedOnc  ?Treatment Phase Pre-Tx/Tx Discussion  ?Barriers/Navigation Needs Coordination of Care;Education  ?Education -  ?Interventions None Required  ?Acuity Level 2-Minimal Needs (1-2 Barriers Identified)  ?Referrals -  ?Coordination of Care -  ?Education Method -  ?Support Groups/Services Friends and Family  ?Time Spent with Patient 15  ?  ?

## 2021-06-10 NOTE — Telephone Encounter (Signed)
Requesting: alprazolam 0.5mg  ?Contract: 12/27/2017 ?UDS: 12/27/2017 ?Last Visit: 05/08/21 ?Next Visit: None ?Last Refill: 04/21/21 #90 and 0RF ? ?Please Advise ? ?

## 2021-06-11 ENCOUNTER — Inpatient Hospital Stay: Payer: PPO

## 2021-06-11 ENCOUNTER — Encounter: Payer: Self-pay | Admitting: *Deleted

## 2021-06-11 ENCOUNTER — Other Ambulatory Visit: Payer: Self-pay

## 2021-06-11 ENCOUNTER — Ambulatory Visit: Payer: PPO

## 2021-06-11 VITALS — BP 142/92 | HR 94 | Temp 98.2°F | Resp 20

## 2021-06-11 DIAGNOSIS — C3412 Malignant neoplasm of upper lobe, left bronchus or lung: Secondary | ICD-10-CM

## 2021-06-11 DIAGNOSIS — Z5112 Encounter for antineoplastic immunotherapy: Secondary | ICD-10-CM | POA: Diagnosis not present

## 2021-06-11 DIAGNOSIS — C3490 Malignant neoplasm of unspecified part of unspecified bronchus or lung: Secondary | ICD-10-CM

## 2021-06-11 LAB — CBC WITH DIFFERENTIAL (CANCER CENTER ONLY)
Abs Immature Granulocytes: 0.03 10*3/uL (ref 0.00–0.07)
Basophils Absolute: 0 10*3/uL (ref 0.0–0.1)
Basophils Relative: 0 %
Eosinophils Absolute: 0.1 10*3/uL (ref 0.0–0.5)
Eosinophils Relative: 2 %
HCT: 39.7 % (ref 36.0–46.0)
Hemoglobin: 13.2 g/dL (ref 12.0–15.0)
Immature Granulocytes: 0 %
Lymphocytes Relative: 14 %
Lymphs Abs: 1.2 10*3/uL (ref 0.7–4.0)
MCH: 30.3 pg (ref 26.0–34.0)
MCHC: 33.2 g/dL (ref 30.0–36.0)
MCV: 91.1 fL (ref 80.0–100.0)
Monocytes Absolute: 0.7 10*3/uL (ref 0.1–1.0)
Monocytes Relative: 8 %
Neutro Abs: 6.7 10*3/uL (ref 1.7–7.7)
Neutrophils Relative %: 76 %
Platelet Count: 259 10*3/uL (ref 150–400)
RBC: 4.36 MIL/uL (ref 3.87–5.11)
RDW: 12.9 % (ref 11.5–15.5)
WBC Count: 8.8 10*3/uL (ref 4.0–10.5)
nRBC: 0 % (ref 0.0–0.2)

## 2021-06-11 LAB — CMP (CANCER CENTER ONLY)
ALT: 13 U/L (ref 0–44)
AST: 18 U/L (ref 15–41)
Albumin: 4 g/dL (ref 3.5–5.0)
Alkaline Phosphatase: 88 U/L (ref 38–126)
Anion gap: 12 (ref 5–15)
BUN: 11 mg/dL (ref 8–23)
CO2: 30 mmol/L (ref 22–32)
Calcium: 10.2 mg/dL (ref 8.9–10.3)
Chloride: 93 mmol/L — ABNORMAL LOW (ref 98–111)
Creatinine: 0.81 mg/dL (ref 0.44–1.00)
GFR, Estimated: >60 mL/min (ref >60)
Glucose, Bld: 223 mg/dL — ABNORMAL HIGH (ref 70–99)
Potassium: 3.6 mmol/L (ref 3.5–5.1)
Sodium: 135 mmol/L (ref 135–145)
Total Bilirubin: 0.3 mg/dL (ref 0.3–1.2)
Total Protein: 7.9 g/dL (ref 6.5–8.1)

## 2021-06-11 LAB — TSH: TSH: 16.939 u[IU]/mL — ABNORMAL HIGH (ref 0.308–3.960)

## 2021-06-11 MED ORDER — SODIUM CHLORIDE 0.9 % IV SOLN: Freq: Once | INTRAVENOUS | Status: AC

## 2021-06-11 MED ORDER — SODIUM CHLORIDE 0.9 % IV SOLN
10.0000 mg | Freq: Once | INTRAVENOUS | Status: AC
  Administered 2021-06-11: 10 mg via INTRAVENOUS
  Filled 2021-06-11: qty 10

## 2021-06-11 MED ORDER — DENOSUMAB 120 MG/1.7ML ~~LOC~~ SOLN
120.0000 mg | Freq: Once | SUBCUTANEOUS | Status: AC
  Administered 2021-06-11: 120 mg via SUBCUTANEOUS
  Filled 2021-06-11: qty 1.7

## 2021-06-11 MED ORDER — SODIUM CHLORIDE 0.9 % IV SOLN
1200.0000 mg | Freq: Once | INTRAVENOUS | Status: AC
  Administered 2021-06-11: 1200 mg via INTRAVENOUS
  Filled 2021-06-11: qty 20

## 2021-06-11 MED ORDER — PALONOSETRON HCL INJECTION 0.25 MG/5ML
0.2500 mg | Freq: Once | INTRAVENOUS | Status: AC
  Administered 2021-06-11: 0.25 mg via INTRAVENOUS
  Filled 2021-06-11: qty 5

## 2021-06-11 MED ORDER — SODIUM CHLORIDE 0.9 % IV SOLN
80.0000 mg/m2 | Freq: Once | INTRAVENOUS | Status: AC
  Administered 2021-06-11: 120 mg via INTRAVENOUS
  Filled 2021-06-11: qty 6

## 2021-06-11 MED ORDER — SODIUM CHLORIDE 0.9 % IV SOLN
350.0000 mg | Freq: Once | INTRAVENOUS | Status: AC
  Administered 2021-06-11: 350 mg via INTRAVENOUS
  Filled 2021-06-11: qty 35

## 2021-06-11 MED ORDER — SODIUM CHLORIDE 0.9 % IV SOLN
150.0000 mg | Freq: Once | INTRAVENOUS | Status: AC
  Administered 2021-06-11: 150 mg via INTRAVENOUS
  Filled 2021-06-11: qty 150

## 2021-06-11 NOTE — Patient Instructions (Signed)
[facility] [place]  Discharge Instructions: ?Thank you for choosing [facility] to provide your oncology and hematology care.  ? ?If you have a lab appointment with the [facility], please go directly to the [facility] and check in at the registration area. ? ?Wear comfortable clothing and clothing appropriate for easy access to any Portacath or PICC line.  ? ?We strive to give you quality time with your provider. You may need to reschedule your appointment if you arrive late (15 or more minutes).  Arriving late affects you and other patients whose appointments are after yours.  Also, if you miss three or more appointments without notifying the office, you may be dismissed from the clinic at the provider?s discretion.    ?  ?For prescription refill requests, have your pharmacy contact our office and allow 72 hours for refills to be completed.   ? ?Today you received the following chemotherapy and/or immunotherapy agents   vp-16, tecentriq, carbo ?  ?To help prevent nausea and vomiting after your treatment, we encourage you to take your nausea medication as directed. ? ?BELOW ARE SYMPTOMS THAT SHOULD BE REPORTED IMMEDIATELY: ?*FEVER GREATER THAN 100.4 F (38 ?C) OR HIGHER ?*CHILLS OR SWEATING ?*NAUSEA AND VOMITING THAT IS NOT CONTROLLED WITH YOUR NAUSEA MEDICATION ?*UNUSUAL SHORTNESS OF BREATH ?*UNUSUAL BRUISING OR BLEEDING ?*URINARY PROBLEMS (pain or burning when urinating, or frequent urination) ?*BOWEL PROBLEMS (unusual diarrhea, constipation, pain near the anus) ?TENDERNESS IN MOUTH AND THROAT WITH OR WITHOUT PRESENCE OF ULCERS (sore throat, sores in mouth, or a toothache) ?UNUSUAL RASH, SWELLING OR PAIN  ?UNUSUAL VAGINAL DISCHARGE OR ITCHING  ? ?Items with * indicate a potential emergency and should be followed up as soon as possible or go to the Emergency Department if any problems should occur. ? ?Please show the CHEMOTHERAPY ALERT CARD or IMMUNOTHERAPY ALERT CARD at  check-in to the Emergency Department and triage nurse. ?Should you have questions after your visit or need to cancel or reschedule your appointment, please contact [facility]  [phone] and follow the prompts.  Office hours are 8:00 a.m. to 4:30 p.m. Monday - Friday. Please note that voicemails left after 4:00 p.m. may not be returned until the following business day.  We are closed weekends and major holidays. You have access to a nurse at all times for urgent questions. Please call the main number to the clinic [phone] and follow the prompts. ? ?For any non-urgent questions, you may also contact your provider using MyChart. We now offer e-Visits for anyone 81 and older to request care online for non-urgent symptoms. For details visit [email or web address] ?  ?Also download the MyChart app! Go to the app store, search "MyChart", open the app, select [facility], and log in with your MyChart username and password. ? ?Due to Covid, a mask is required upon entering the hospital/clinic. If you do not have a mask, one will be given to you upon arrival. For doctor visits, patients may have 1 support person aged 8 or older with them. For treatment visits, patients cannot have anyone with them due to current Covid guidelines and our immunocompromised population.  ?

## 2021-06-11 NOTE — Progress Notes (Signed)
No need to wait on labs today prior to treatment per order of Dr. Marin Olp. OK to use labs from 06/06/21. ?

## 2021-06-11 NOTE — Progress Notes (Unsigned)
Patient in chemotherapy education class with  self while starting Cycle 1 of chemotherapy in infusion room.  Discussed side effects of Carboplatin, Etoposide, Tecentriq which include but are not limited to myelosuppression, decreased appetite, fatigue, fever, allergic or infusional reaction, mucositis, cardiac toxicity, cough, SOB, altered taste, nausea and vomiting, diarrhea, constipation, elevated LFTs myalgia and arthralgias, hair loss or thinning, rash, skin dryness, nail changes, peripheral neuropathy, discolored urine, delayed wound healing, mental changes (Chemo brain), increased risk of infections, weight loss.  Reviewed infusion room and office policy and procedure and phone numbers 24 hours x 7 days a week.  Reviewed immunotherapy side effects including rash, diarrhea, liver/kidney dysfunction as well as thyroid dysfunction.  Reviewed when to call the office with any concerns or problems.  Scientist, clinical (histocompatibility and immunogenetics) given.  Discussed portacath insertion and EMLA cream administration.  Antiemetic protocol and chemotherapy schedule reviewed. Patient verbalized understanding of chemotherapy indications and possible side effects.  Teachback done  ?

## 2021-06-11 NOTE — Patient Instructions (Signed)
[facility] [place] [place]  Discharge Instructions: ?Thank you for choosing [facility] to provide your oncology and hematology care.  ? ?If you have a lab appointment with the [facility], please go directly to the [facility] and check in at the registration area. ? ?Wear comfortable clothing and clothing appropriate for easy access to any Portacath or PICC line.  ? ?We strive to give you quality time with your provider. You may need to reschedule your appointment if you arrive late (15 or more minutes).  Arriving late affects you and other patients whose appointments are after yours.  Also, if you miss three or more appointments without notifying the office, you may be dismissed from the clinic at the provider?s discretion.    ?  ?For prescription refill requests, have your pharmacy contact our office and allow 72 hours for refills to be completed.   ? ?Today you received the following chemotherapy and/or immunotherapy agents Tecentriq, Carboplatin, Etoposide ? ?1) At any time, you can take Prochlorperazine (Compazine) every 6 hours AS NEEDED for nausea    ?2) Beginning Friday, if you experience nausea, you can take (Ondansetron) Zofran every 8 hours for nausea ?  ?To help prevent nausea and vomiting after your treatment, we encourage you to take your nausea medication as directed. ? ?BELOW ARE SYMPTOMS THAT SHOULD BE REPORTED IMMEDIATELY: ?*FEVER GREATER THAN 100.4 F (38 ?C) OR HIGHER ?*CHILLS OR SWEATING ?*NAUSEA AND VOMITING THAT IS NOT CONTROLLED WITH YOUR NAUSEA MEDICATION ?*UNUSUAL SHORTNESS OF BREATH ?*UNUSUAL BRUISING OR BLEEDING ?*URINARY PROBLEMS (pain or burning when urinating, or frequent urination) ?*BOWEL PROBLEMS (unusual diarrhea, constipation, pain near the anus) ?TENDERNESS IN MOUTH AND THROAT WITH OR WITHOUT PRESENCE OF ULCERS (sore throat, sores in mouth, or a toothache) ?UNUSUAL RASH, SWELLING OR PAIN  ?UNUSUAL VAGINAL DISCHARGE OR ITCHING  ? ?Items with *  indicate a potential emergency and should be followed up as soon as possible or go to the Emergency Department if any problems should occur. ? ?Please show the CHEMOTHERAPY ALERT CARD or IMMUNOTHERAPY ALERT CARD at check-in to the Emergency Department and triage nurse. ?Should you have questions after your visit or need to cancel or reschedule your appointment, please contact [facility]  [phone] and follow the prompts.  Office hours are 8:00 a.m. to 4:30 p.m. Monday - Friday. Please note that voicemails left after 4:00 p.m. may not be returned until the following business day.  We are closed weekends and major holidays. You have access to a nurse at all times for urgent questions. Please call the main number to the clinic [phone] and follow the prompts. ? ?For any non-urgent questions, you may also contact your provider using MyChart. We now offer e-Visits for anyone 71 and older to request care online for non-urgent symptoms. For details visit [email or web address] ?  ?Also download the MyChart app! Go to the app store, search "MyChart", open the app, select [facility], and log in with your MyChart username and password. ? ?Due to Covid, a mask is required upon entering the hospital/clinic. If you do not have a mask, one will be given to you upon arrival. For doctor visits, patients may have 1 support person aged 71 or older with them. For treatment visits, patients cannot have anyone with them due to current Covid guidelines and our immunocompromised population.  ?

## 2021-06-11 NOTE — Progress Notes (Signed)
Patient is here to begin treatment. She states she is ready, but a little worried about her schedule this week. I encouraged her to make sure that this weekend she have nothing planned and she rest at home.  ? ?She received chemo education this morning. She is scheduled to be in the office daily until Friday. Will continue to check in with her when she's here.  ? ?Oncology Nurse Navigator Documentation ? ?Oncology Nurse Navigator Flowsheets 06/11/2021  ?Abnormal Finding Date -  ?Confirmed Diagnosis Date -  ?Diagnosis Status -  ?Navigator Follow Up Date: 06/20/2021  ?Navigator Follow Up Reason: Follow-up Appointment  ?Navigator Location [facility]  ?Referral Date to RadOnc/MedOnc -  ?Navigator Encounter Type Treatment  ?Telephone -  ?Patient Visit Type MedOnc  ?Treatment Phase First Chemo Tx  ?Barriers/Navigation Needs Coordination of Care;Education  ?Education Other  ?Interventions Education;Psycho-Social Support  ?Acuity Level 2-Minimal Needs (1-2 Barriers Identified)  ?Referrals -  ?Coordination of Care -  ?Education Method Verbal  ?Support Groups/Services Friends and Family  ?Time Spent with Patient 30  ?  ? ?

## 2021-06-12 ENCOUNTER — Encounter: Payer: Self-pay | Admitting: Hematology & Oncology

## 2021-06-12 ENCOUNTER — Telehealth: Payer: Self-pay | Admitting: *Deleted

## 2021-06-12 ENCOUNTER — Other Ambulatory Visit: Payer: Self-pay | Admitting: *Deleted

## 2021-06-12 ENCOUNTER — Inpatient Hospital Stay: Payer: PPO

## 2021-06-12 ENCOUNTER — Other Ambulatory Visit (HOSPITAL_COMMUNITY): Payer: Self-pay | Admitting: Physician Assistant

## 2021-06-12 VITALS — BP 141/88 | HR 90 | Temp 98.4°F | Resp 18

## 2021-06-12 DIAGNOSIS — C3412 Malignant neoplasm of upper lobe, left bronchus or lung: Secondary | ICD-10-CM

## 2021-06-12 DIAGNOSIS — Z5112 Encounter for antineoplastic immunotherapy: Secondary | ICD-10-CM | POA: Diagnosis not present

## 2021-06-12 LAB — T4: T4, Total: 6.3 ug/dL (ref 4.5–12.0)

## 2021-06-12 MED ORDER — SODIUM CHLORIDE 0.9 % IV SOLN
10.0000 mg | Freq: Once | INTRAVENOUS | Status: AC
  Administered 2021-06-12: 11:00:00 10 mg via INTRAVENOUS
  Filled 2021-06-12: qty 10

## 2021-06-12 MED ORDER — SODIUM CHLORIDE 0.9 % IV SOLN
80.0000 mg/m2 | Freq: Once | INTRAVENOUS | Status: AC
  Administered 2021-06-12: 12:00:00 120 mg via INTRAVENOUS
  Filled 2021-06-12: qty 6

## 2021-06-12 MED ORDER — LEVOTHYROXINE SODIUM 50 MCG PO TABS: 50.0000 ug | ORAL_TABLET | Freq: Every day | ORAL | 3 refills | Status: DC

## 2021-06-12 MED ORDER — SODIUM CHLORIDE 0.9 % IV SOLN: Freq: Once | INTRAVENOUS | Status: AC

## 2021-06-12 NOTE — Telephone Encounter (Signed)
Call placed to patient's daughter Neoma Laming to notify her per order of Dr. Marin Olp that Algie's "thyroid is low!!  She needs Synthroid 50 mcg po daily."  Deborah requests that the new prescription for Synthroid be sent to the [facility] on [facility]. Prescription sent as requested.  ? ?

## 2021-06-12 NOTE — Patient Instructions (Signed)
[name]  Discharge Instructions: ?Thank you for choosing [facility] to provide your oncology and hematology care.  ? ?If you have a lab appointment with the [facility], please go directly to the [facility] and check in at the registration area. ? ?Wear comfortable clothing and clothing appropriate for easy access to any Portacath or PICC line.  ? ?We strive to give you quality time with your provider. You may need to reschedule your appointment if you arrive late (15 or more minutes).  Arriving late affects you and other patients whose appointments are after yours.  Also, if you miss three or more appointments without notifying the office, you may be dismissed from the clinic at the provider?s discretion.    ?  ?For prescription refill requests, have your pharmacy contact our office and allow 72 hours for refills to be completed.   ? ?Today you received the following chemotherapy and/or immunotherapy agents Vepesid.    ?  ?To help prevent nausea and vomiting after your treatment, we encourage you to take your nausea medication as directed. ? ?BELOW ARE SYMPTOMS THAT SHOULD BE REPORTED IMMEDIATELY: ?*FEVER GREATER THAN 100.4 F (38 ?C) OR HIGHER ?*CHILLS OR SWEATING ?*NAUSEA AND VOMITING THAT IS NOT CONTROLLED WITH YOUR NAUSEA MEDICATION ?*UNUSUAL SHORTNESS OF BREATH ?*UNUSUAL BRUISING OR BLEEDING ?*URINARY PROBLEMS (pain or burning when urinating, or frequent urination) ?*BOWEL PROBLEMS (unusual diarrhea, constipation, pain near the anus) ?TENDERNESS IN MOUTH AND THROAT WITH OR WITHOUT PRESENCE OF ULCERS (sore throat, sores in mouth, or a toothache) ?UNUSUAL RASH, SWELLING OR PAIN  ?UNUSUAL VAGINAL DISCHARGE OR ITCHING  ? ?Items with * indicate a potential emergency and should be followed up as soon as possible or go to the Emergency Department if any problems should occur. ? ?Please show the CHEMOTHERAPY ALERT CARD or IMMUNOTHERAPY ALERT CARD at check-in to the  Emergency Department and triage nurse. ?Should you have questions after your visit or need to cancel or reschedule your appointment, please contact [facility]  [phone] and follow the prompts.  Office hours are 8:00 a.m. to 4:30 p.m. Monday - Friday. Please note that voicemails left after 4:00 p.m. may not be returned until the following business day.  We are closed weekends and major holidays. You have access to a nurse at all times for urgent questions. Please call the main number to the clinic [phone] and follow the prompts. ? ?For any non-urgent questions, you may also contact your provider using MyChart. We now offer e-Visits for anyone 1 and older to request care online for non-urgent symptoms. For details visit [email or web address]. ?  ?Also download the MyChart app! Go to the app store, search "MyChart", open the app, select [facility], and log in with your MyChart username and password. ? ?Due to Covid, a mask is required upon entering the hospital/clinic. If you do not have a mask, one will be given to you upon arrival. For doctor visits, patients may have 1 support person aged 71 or older with them. For treatment visits, patients cannot have anyone with them due to current Covid guidelines and our immunocompromised population.  ?

## 2021-06-12 NOTE — Telephone Encounter (Signed)
-----   Message from [name], MD sent at 06/12/2021  8:03 AM EST ----- ?Call - the thyroid is low!!  She needs Synthroid 50 mcg po q day.  Please call this in!!  [facility] ?

## 2021-06-13 ENCOUNTER — Ambulatory Visit (HOSPITAL_COMMUNITY)
Admission: RE | Admit: 2021-06-13 | Discharge: 2021-06-13 | Disposition: A | Discharge status: Home / Self Care | Payer: PPO | Source: Ambulatory Visit | Attending: Hematology & Oncology | Admitting: Hematology & Oncology

## 2021-06-13 ENCOUNTER — Inpatient Hospital Stay: Payer: PPO

## 2021-06-13 ENCOUNTER — Telehealth: Payer: Self-pay

## 2021-06-13 ENCOUNTER — Other Ambulatory Visit: Payer: Self-pay

## 2021-06-13 ENCOUNTER — Ambulatory Visit (HOSPITAL_COMMUNITY): Admission: RE | Admit: 2021-06-13 | Payer: PPO | Source: Ambulatory Visit

## 2021-06-13 ENCOUNTER — Telehealth: Payer: Self-pay | Admitting: *Deleted

## 2021-06-13 ENCOUNTER — Other Ambulatory Visit: Payer: Self-pay | Admitting: Hematology & Oncology

## 2021-06-13 ENCOUNTER — Encounter (HOSPITAL_COMMUNITY): Payer: Self-pay

## 2021-06-13 VITALS — BP 119/66 | HR 97 | Temp 98.0°F | Resp 18

## 2021-06-13 DIAGNOSIS — Z5112 Encounter for antineoplastic immunotherapy: Secondary | ICD-10-CM | POA: Diagnosis not present

## 2021-06-13 DIAGNOSIS — C3490 Malignant neoplasm of unspecified part of unspecified bronchus or lung: Secondary | ICD-10-CM

## 2021-06-13 DIAGNOSIS — Z79890 Hormone replacement therapy (postmenopausal): Secondary | ICD-10-CM | POA: Insufficient documentation

## 2021-06-13 DIAGNOSIS — C779 Secondary and unspecified malignant neoplasm of lymph node, unspecified: Secondary | ICD-10-CM | POA: Diagnosis not present

## 2021-06-13 DIAGNOSIS — G4733 Obstructive sleep apnea (adult) (pediatric): Secondary | ICD-10-CM | POA: Diagnosis not present

## 2021-06-13 DIAGNOSIS — Z7985 Long-term (current) use of injectable non-insulin antidiabetic drugs: Secondary | ICD-10-CM | POA: Diagnosis not present

## 2021-06-13 DIAGNOSIS — C787 Secondary malignant neoplasm of liver and intrahepatic bile duct: Secondary | ICD-10-CM | POA: Diagnosis not present

## 2021-06-13 DIAGNOSIS — I639 Cerebral infarction, unspecified: Secondary | ICD-10-CM | POA: Diagnosis not present

## 2021-06-13 DIAGNOSIS — Z794 Long term (current) use of insulin: Secondary | ICD-10-CM | POA: Insufficient documentation

## 2021-06-13 DIAGNOSIS — Z87891 Personal history of nicotine dependence: Secondary | ICD-10-CM | POA: Diagnosis not present

## 2021-06-13 DIAGNOSIS — Z452 Encounter for adjustment and management of vascular access device: Secondary | ICD-10-CM | POA: Diagnosis not present

## 2021-06-13 DIAGNOSIS — Z79899 Other long term (current) drug therapy: Secondary | ICD-10-CM | POA: Diagnosis not present

## 2021-06-13 DIAGNOSIS — E039 Hypothyroidism, unspecified: Secondary | ICD-10-CM | POA: Insufficient documentation

## 2021-06-13 DIAGNOSIS — C3412 Malignant neoplasm of upper lobe, left bronchus or lung: Secondary | ICD-10-CM

## 2021-06-13 DIAGNOSIS — Z9889 Other specified postprocedural states: Secondary | ICD-10-CM | POA: Diagnosis not present

## 2021-06-13 DIAGNOSIS — Z7984 Long term (current) use of oral hypoglycemic drugs: Secondary | ICD-10-CM | POA: Diagnosis not present

## 2021-06-13 DIAGNOSIS — I10 Essential (primary) hypertension: Secondary | ICD-10-CM | POA: Diagnosis not present

## 2021-06-13 DIAGNOSIS — E119 Type 2 diabetes mellitus without complications: Secondary | ICD-10-CM | POA: Diagnosis not present

## 2021-06-13 HISTORY — PX: IR IMAGING GUIDED PORT INSERTION: IMG5740

## 2021-06-13 LAB — GLUCOSE, CAPILLARY: Glucose-Capillary: 127 mg/dL — ABNORMAL HIGH (ref 70–99)

## 2021-06-13 MED ORDER — MIDAZOLAM HCL 2 MG/2ML IJ SOLN
INTRAMUSCULAR | Status: AC
  Filled 2021-06-13: qty 2

## 2021-06-13 MED ORDER — SODIUM CHLORIDE 0.9 % IV SOLN: Freq: Once | INTRAVENOUS | Status: AC

## 2021-06-13 MED ORDER — HEPARIN SOD (PORK) LOCK FLUSH 100 UNIT/ML IV SOLN
INTRAVENOUS | Status: AC
  Filled 2021-06-13: qty 5

## 2021-06-13 MED ORDER — MIDAZOLAM HCL 2 MG/2ML IJ SOLN
INTRAMUSCULAR | Status: AC | PRN
Start: 2021-06-13 — End: 2021-06-13
  Administered 2021-06-13: .5 mg via INTRAVENOUS
  Administered 2021-06-13: 1 mg via INTRAVENOUS
  Administered 2021-06-13: .5 mg via INTRAVENOUS

## 2021-06-13 MED ORDER — LIDOCAINE-EPINEPHRINE 1 %-1:100000 IJ SOLN
INTRAMUSCULAR | Status: AC
  Filled 2021-06-13: qty 1

## 2021-06-13 MED ORDER — FENTANYL CITRATE (PF) 100 MCG/2ML IJ SOLN
INTRAMUSCULAR | Status: AC | PRN
Start: 2021-06-13 — End: 2021-06-13
  Administered 2021-06-13 (×2): 50 ug via INTRAVENOUS

## 2021-06-13 MED ORDER — SODIUM CHLORIDE 0.9 % IV SOLN
10.0000 mg | Freq: Once | INTRAVENOUS | Status: AC
  Administered 2021-06-13: 10 mg via INTRAVENOUS
  Filled 2021-06-13: qty 10

## 2021-06-13 MED ORDER — GADOBUTROL 1 MMOL/ML IV SOLN
5.0000 mL | Freq: Once | INTRAVENOUS | Status: AC | PRN
  Administered 2021-06-13: 5 mL via INTRAVENOUS

## 2021-06-13 MED ORDER — HEPARIN SOD (PORK) LOCK FLUSH 100 UNIT/ML IV SOLN
500.0000 [IU] | Freq: Once | INTRAVENOUS | Status: AC | PRN
  Administered 2021-06-13: 500 [IU]

## 2021-06-13 MED ORDER — SODIUM CHLORIDE 0.9 % IV SOLN: INTRAVENOUS | Status: DC

## 2021-06-13 MED ORDER — SODIUM CHLORIDE 0.9% FLUSH
10.0000 mL | INTRAVENOUS | Status: DC | PRN
  Administered 2021-06-13: 10 mL

## 2021-06-13 MED ORDER — FENTANYL CITRATE (PF) 100 MCG/2ML IJ SOLN
INTRAMUSCULAR | Status: AC
  Filled 2021-06-13: qty 2

## 2021-06-13 MED ORDER — SODIUM CHLORIDE 0.9 % IV SOLN
80.0000 mg/m2 | Freq: Once | INTRAVENOUS | Status: AC
  Administered 2021-06-13: 120 mg via INTRAVENOUS
  Filled 2021-06-13: qty 6

## 2021-06-13 NOTE — Telephone Encounter (Signed)
Called and lvm of rescheduling from 3/17 to 3/16 with Judson Roch - requested call back to confirm ?

## 2021-06-13 NOTE — Telephone Encounter (Signed)
-----   Message from [name], MD sent at 06/13/2021  9:35 AM EST ----- ?Call - the MRI of the brain does not show any cancer.  [name] ?

## 2021-06-13 NOTE — Telephone Encounter (Signed)
Attached message given to pt during today's chemo treatment. Verbalizes understanding and appreciation. dph ?

## 2021-06-13 NOTE — Patient Instructions (Signed)
[facility] [place]  Discharge Instructions: ?Thank you for choosing [facility] to provide your oncology and hematology care.  ? ?If you have a lab appointment with the [facility], please go directly to the [facility] and check in at the registration area. ? ?Wear comfortable clothing and clothing appropriate for easy access to any Portacath or PICC line.  ? ?We strive to give you quality time with your provider. You may need to reschedule your appointment if you arrive late (15 or more minutes).  Arriving late affects you and other patients whose appointments are after yours.  Also, if you miss three or more appointments without notifying the office, you may be dismissed from the clinic at the provider?s discretion.    ?  ?For prescription refill requests, have your pharmacy contact our office and allow 72 hours for refills to be completed.   ? ?Today you received the following chemotherapy and/or immunotherapy agents VP16    ?  ?To help prevent nausea and vomiting after your treatment, we encourage you to take your nausea medication as directed. ? ?BELOW ARE SYMPTOMS THAT SHOULD BE REPORTED IMMEDIATELY: ?*FEVER GREATER THAN 100.4 F (38 ?C) OR HIGHER ?*CHILLS OR SWEATING ?*NAUSEA AND VOMITING THAT IS NOT CONTROLLED WITH YOUR NAUSEA MEDICATION ?*UNUSUAL SHORTNESS OF BREATH ?*UNUSUAL BRUISING OR BLEEDING ?*URINARY PROBLEMS (pain or burning when urinating, or frequent urination) ?*BOWEL PROBLEMS (unusual diarrhea, constipation, pain near the anus) ?TENDERNESS IN MOUTH AND THROAT WITH OR WITHOUT PRESENCE OF ULCERS (sore throat, sores in mouth, or a toothache) ?UNUSUAL RASH, SWELLING OR PAIN  ?UNUSUAL VAGINAL DISCHARGE OR ITCHING  ? ?Items with * indicate a potential emergency and should be followed up as soon as possible or go to the Emergency Department if any problems should occur. ? ?Please show the CHEMOTHERAPY ALERT CARD or IMMUNOTHERAPY ALERT CARD at check-in to the  Emergency Department and triage nurse. ?Should you have questions after your visit or need to cancel or reschedule your appointment, please contact [facility]  [phone] and follow the prompts.  Office hours are 8:00 a.m. to 4:30 p.m. Monday - Friday. Please note that voicemails left after 4:00 p.m. may not be returned until the following business day.  We are closed weekends and major holidays. You have access to a nurse at all times for urgent questions. Please call the main number to the clinic [phone] and follow the prompts. ? ?For any non-urgent questions, you may also contact your provider using MyChart. We now offer e-Visits for anyone 69 and older to request care online for non-urgent symptoms. For details visit [email or web address] ?  ?Also download the MyChart app! Go to the app store, search "MyChart", open the app, select [facility], and log in with your MyChart username and password. ? ?Due to Covid, a mask is required upon entering the hospital/clinic. If you do not have a mask, one will be given to you upon arrival. For doctor visits, patients may have 1 support person aged 12 or older with them. For treatment visits, patients cannot have anyone with them due to current Covid guidelines and our immunocompromised population.  ?

## 2021-06-13 NOTE — Procedures (Signed)
Interventional Radiology Procedure Note  Procedure: Single Lumen Power Port Placement    Access:  Right IJ vein.  Findings: Catheter tip positioned at SVC/RA junction. Port is ready for immediate use.   Complications: None  EBL: < 10 mL  Recommendations:  - Ok to shower in 24 hours - Do not submerge for 7 days - Routine line care   Keary Waterson T. Orvin Netter, M.D Pager:  [phone]   

## 2021-06-13 NOTE — Discharge Instructions (Signed)
For questions /concerns may call Interventional Radiology at [phone] ? ?You may remove your dressing and shower tomorrow afternoon ? ?DO NOT use EMLA cream for 2 weeks after port placement as the cream will remove surgical glue on your incision.    ? ? ?Moderate Conscious Sedation, Adult, Care After ?This sheet gives you information about how to care for yourself after your procedure. Your health care provider may also give you more specific instructions. If you have problems or questions, contact your health careprovider. ?What can I expect after the procedure? ?After the procedure, it is common to have: ?Sleepiness for several hours. ?Impaired judgment for several hours. ?Difficulty with balance. ?Vomiting if you eat too soon. ?Follow these instructions at home: ?For the time period you were told by your health care provider: ?Rest. ?Do not participate in activities where you could fall or become injured. ?Do not drive or use machinery. ?Do not drink alcohol. ?Do not take sleeping pills or medicines that cause drowsiness. ?Do not make important decisions or sign legal documents. ?Do not take care of children on your own. ?Eating and drinking ? ?Follow the diet recommended by your health care provider. ?Drink enough fluid to keep your urine pale yellow. ?If you vomit: ?Drink water, juice, or soup when you can drink without vomiting. ?Make sure you have little or no nausea before eating solid foods. ? ?General instructions ?Take over-the-counter and prescription medicines only as told by your health care provider. ?Have a responsible adult stay with you for the time you are told. It is important to have someone help care for you until you are awake and alert. ?Do not smoke. ?Keep all follow-up visits as told by your health care provider. This is important. ?Contact a health care provider if: ?You are still sleepy or having trouble with balance after 24 hours. ?You feel light-headed. ?You keep feeling nauseous  or you keep vomiting. ?You develop a rash. ?You have a fever. ?You have redness or swelling around the IV site. ?Get help right away if: ?You have trouble breathing. ?You have new-onset confusion at home. ?Summary ?After the procedure, it is common to feel sleepy, have impaired judgment, or feel nauseous if you eat too soon. ?Rest after you get home. Know the things you should not do after the procedure. ?Follow the diet recommended by your health care provider and drink enough fluid to keep your urine pale yellow. ?Get help right away if you have trouble breathing or new-onset confusion at home. ?This information is not intended to replace advice given to you by your health care provider. Make sure you discuss any questions you have with your healthcare provider. ? ? Implanted Port Insertion, Care After ?This sheet gives you information about how to care for yourself after your procedure. Your health care provider may also give you more specific instructions. If you have problems or questions, contact your health careprovider. ?What can I expect after the procedure? ?After the procedure, it is common to have: ?Discomfort at the port insertion site. ?Bruising on the skin over the port. This should improve over 3-4 days. ?Follow these instructions at home: ?Port care ?After your port is placed, you will get a manufacturer's information card. The card has information about your port. Keep this card with you at all times. ?Take care of the port as told by your health care provider. Ask your health care provider if you or a family member can get training for taking care of the port at  home. A home health care nurse may also take care of the port. ?Make sure to remember what type of port you have. ?Incision care ?Follow instructions from your health care provider about how to take care of your port insertion site. Make sure you: ?Wash your hands with soap and water before and after you change your bandage (dressing). If  soap and water are not available, use hand sanitizer. ?Change your dressing as told by your health care provider. ?Leave skin glue, or adhesive strips in place. These skin closures may need to stay in place for 2 weeks or longer.  ?Check your port insertion site every day for signs of infection. Check for: ?     - Redness, swelling, or pain. ?                    - Fluid or blood. ?     - Warmth. ?     - Pus or a bad smell. ?Activity ?Return to your normal activities as told by your health care provider. Ask your health care provider what activities are safe for you. ?Do not lift anything that is heavier than 10 lb (4.5 kg), or the limit that you are told, until your health care provider says that it is safe. ?General instructions ?Take over-the-counter and prescription medicines only as told by your health care provider. ?Do not take baths, swim, or use a hot tub until your health care provider approves. Ask your health care provider if you may take showers. You may only be allowed to take sponge baths. ?Do not drive for 24 hours if you were given a sedative during your procedure. ?Wear a medical alert bracelet in case of an emergency. This will tell any health care providers that you have a port. ?Keep all follow-up visits as told by your health care provider. This is important. ?Contact a health care provider if: ?You cannot flush your port with saline as directed, or you cannot draw blood from the port. ?You have a fever or chills. ?You have redness, swelling, or pain around your port insertion site. ?You have fluid or blood coming from your port insertion site. ?Your port insertion site feels warm to the touch. ?You have pus or a bad smell coming from the port insertion site. ?Get help right away if: ?You have chest pain or shortness of breath. ?You have bleeding from your port that you cannot control. ?Summary ?Take care of the port as told by your health care provider. Keep the manufacturer's information card  with you at all times. ?Change your dressing as told by your health care provider. ?Contact a health care provider if you have a fever or chills or if you have redness, swelling, or pain around your port insertion site. ?Keep all follow-up visits as told by your health care provider. ?This information is not intended to replace advice given to you by your health care provider. Make sure you discuss any questions you have with your healthcare provider. ?Document Revised: 10/19/2017 Document Reviewed: 10/19/2017  ?

## 2021-06-16 ENCOUNTER — Inpatient Hospital Stay: Payer: PPO

## 2021-06-16 ENCOUNTER — Other Ambulatory Visit: Payer: Self-pay

## 2021-06-16 VITALS — BP 101/72 | HR 100 | Temp 98.1°F | Resp 18

## 2021-06-16 DIAGNOSIS — C3412 Malignant neoplasm of upper lobe, left bronchus or lung: Secondary | ICD-10-CM

## 2021-06-16 DIAGNOSIS — Z5112 Encounter for antineoplastic immunotherapy: Secondary | ICD-10-CM | POA: Diagnosis not present

## 2021-06-16 MED ORDER — PEGFILGRASTIM-CBQV 6 MG/0.6ML ~~LOC~~ SOSY
6.0000 mg | PREFILLED_SYRINGE | Freq: Once | SUBCUTANEOUS | Status: AC
  Administered 2021-06-16: 6 mg via SUBCUTANEOUS
  Filled 2021-06-16: qty 0.6

## 2021-06-19 ENCOUNTER — Inpatient Hospital Stay: Payer: PPO

## 2021-06-19 ENCOUNTER — Inpatient Hospital Stay: Payer: PPO | Admitting: Family

## 2021-06-19 ENCOUNTER — Encounter: Payer: Self-pay | Admitting: Family

## 2021-06-19 ENCOUNTER — Other Ambulatory Visit: Payer: Self-pay

## 2021-06-19 VITALS — BP 98/62 | HR 103 | Temp 98.0°F | Resp 18 | Ht 59.0 in | Wt 120.0 lb

## 2021-06-19 DIAGNOSIS — C3490 Malignant neoplasm of unspecified part of unspecified bronchus or lung: Secondary | ICD-10-CM | POA: Diagnosis not present

## 2021-06-19 DIAGNOSIS — Z5112 Encounter for antineoplastic immunotherapy: Secondary | ICD-10-CM | POA: Diagnosis not present

## 2021-06-19 DIAGNOSIS — C778 Secondary and unspecified malignant neoplasm of lymph nodes of multiple regions: Secondary | ICD-10-CM

## 2021-06-19 DIAGNOSIS — E032 Hypothyroidism due to medicaments and other exogenous substances: Secondary | ICD-10-CM | POA: Diagnosis not present

## 2021-06-19 DIAGNOSIS — C787 Secondary malignant neoplasm of liver and intrahepatic bile duct: Secondary | ICD-10-CM

## 2021-06-19 LAB — CBC WITH DIFFERENTIAL (CANCER CENTER ONLY)
Abs Immature Granulocytes: 0.01 10*3/uL (ref 0.00–0.07)
Basophils Absolute: 0.1 10*3/uL (ref 0.0–0.1)
Basophils Relative: 2 %
Eosinophils Absolute: 0.1 10*3/uL (ref 0.0–0.5)
Eosinophils Relative: 2 %
HCT: 34.8 % — ABNORMAL LOW (ref 36.0–46.0)
Hemoglobin: 11.6 g/dL — ABNORMAL LOW (ref 12.0–15.0)
Immature Granulocytes: 0 %
Lymphocytes Relative: 34 %
Lymphs Abs: 1.2 10*3/uL (ref 0.7–4.0)
MCH: 30.8 pg (ref 26.0–34.0)
MCHC: 33.3 g/dL (ref 30.0–36.0)
MCV: 92.3 fL (ref 80.0–100.0)
Monocytes Absolute: 0.3 10*3/uL (ref 0.1–1.0)
Monocytes Relative: 8 %
Neutro Abs: 1.9 10*3/uL (ref 1.7–7.7)
Neutrophils Relative %: 54 %
Platelet Count: 160 10*3/uL (ref 150–400)
RBC: 3.77 MIL/uL — ABNORMAL LOW (ref 3.87–5.11)
RDW: 12.3 % (ref 11.5–15.5)
WBC Count: 3.5 10*3/uL — ABNORMAL LOW (ref 4.0–10.5)
nRBC: 0 % (ref 0.0–0.2)

## 2021-06-19 LAB — LACTATE DEHYDROGENASE: LDH: 195 U/L — ABNORMAL HIGH (ref 98–192)

## 2021-06-19 LAB — CMP (CANCER CENTER ONLY)
ALT: 13 U/L (ref 0–44)
AST: 12 U/L — ABNORMAL LOW (ref 15–41)
Albumin: 4 g/dL (ref 3.5–5.0)
Alkaline Phosphatase: 107 U/L (ref 38–126)
Anion gap: 12 (ref 5–15)
BUN: 25 mg/dL — ABNORMAL HIGH (ref 8–23)
CO2: 30 mmol/L (ref 22–32)
Calcium: 9.6 mg/dL (ref 8.9–10.3)
Chloride: 93 mmol/L — ABNORMAL LOW (ref 98–111)
Creatinine: 0.8 mg/dL (ref 0.44–1.00)
GFR, Estimated: >60 mL/min (ref >60)
Glucose, Bld: 272 mg/dL — ABNORMAL HIGH (ref 70–99)
Potassium: 4 mmol/L (ref 3.5–5.1)
Sodium: 135 mmol/L (ref 135–145)
Total Bilirubin: 0.5 mg/dL (ref 0.3–1.2)
Total Protein: 7.6 g/dL (ref 6.5–8.1)

## 2021-06-19 LAB — URIC ACID: Uric Acid, Serum: 5.4 mg/dL (ref 2.5–7.1)

## 2021-06-19 MED ORDER — SODIUM CHLORIDE 0.9% FLUSH
10.0000 mL | Freq: Once | INTRAVENOUS | Status: AC
  Administered 2021-06-19: 10 mL via INTRAVENOUS

## 2021-06-19 MED ORDER — HEPARIN SOD (PORK) LOCK FLUSH 100 UNIT/ML IV SOLN
500.0000 [IU] | Freq: Once | INTRAVENOUS | Status: AC
  Administered 2021-06-19: 500 [IU] via INTRAVENOUS

## 2021-06-19 NOTE — Progress Notes (Signed)
?Hematology and Oncology Follow Up Visit ? ?[name] ?[id number] ?01/02/1951 71 y.o. ?06/19/2021 ? ? ?Principle Diagnosis:  ?Small cell lung cancer -- Extensive stage ?  ?Current Therapy:        ?Carbo/VP-16/Tecentriq -- start cycle 1 on 06/11/2021 ?Xgeva 120 mg subcu every 3 months ?  ?Interim History:  [name] is here today for follow-up and treatment. She is dong quite well since receiving her first cycle of chemo. She states that she did have fatigue earlier in the week and has rested as needed. This seems to be improving.  ?She has had a dry cough for the last 2 weeks. She notes occasional phlegm. Breath sounds clear at this time.  ?No fever, chills, n/v, rash, dizziness, SOB, chest pain, palpitations, abdominal pain or changes in bowel or bladder habits.  ?No blood loss noted. No bruising or petechiae.  ?No swelling, tenderness, numbness or tingling in her extremities at this time.  ?No falls or syncope.  ?She states that her appetite is a little down but she is still eating well at home and doing her best to stay well hydrated. Her weight is stable at 120 lbs.  ? ?ECOG Performance Status: 1 - Symptomatic but completely ambulatory ? ?Medications:  ?Allergies as of 06/19/2021   ? ?   Reactions  ? Hydrocodone-acetaminophen Other (See Comments)  ? Nightmares, skin crawling  ? Lyrica [pregabalin] Other (See Comments)  ? Hallucinations   ? Crestor [rosuvastatin Calcium] Other (See Comments)  ? Leg cramps  ? ?  ? ?  ?Medication List  ?  ? ?  ? Accurate as of June 19, 2021  1:46 PM. If you have any questions, ask your nurse or doctor.  ?  ?  ? ?  ? ?acetaminophen 500 MG tablet ?Commonly known as: TYLENOL ?Take 1,000 mg by mouth every 6 (six) hours as needed for moderate pain. ?  ?albuterol 108 (90 Base) MCG/ACT inhaler ?Commonly known as: VENTOLIN HFA ?Inhale 2 puffs into the lungs every 6 (six) hours as needed for wheezing or shortness of breath. ?  ?ALPRAZolam 0.5 MG tablet ?Commonly known as: Duanne Moron ?TAKE ONE  TABLET BY MOUTH THREE TIMES A DAY AS NEEDED ?  ?blood glucose meter kit and supplies Kit ?Use daily ?  ?fentaNYL 12 MCG/HR ?Commonly known as: [place] ?Place 1 patch onto the skin every 3 (three) days. ?  ?FLUoxetine 20 MG capsule ?Commonly known as: PROZAC ?Take 1 capsule (20 mg total) by mouth daily. ?  ?hydrochlorothiazide 25 MG tablet ?Commonly known as: HYDRODIURIL ?TAKE ONE TABLET BY MOUTH DAILY ?  ?Insulin Pen Needle 32G X 5 MM Misc ?Commonly known as: NovoTwist ?Use daily at Bedtime with Lantus ?  ?Lantus SoloStar 100 UNIT/ML Solostar Pen ?Generic drug: insulin glargine ?Inject 30 Units into the skin daily. HS ?  ?levothyroxine 50 MCG tablet ?Commonly known as: Synthroid ?Take 1 tablet (50 mcg total) by mouth daily before breakfast. ?  ?lidocaine-prilocaine cream ?Commonly known as: EMLA ?Apply to affected area once ?  ?meloxicam 15 MG tablet ?Commonly known as: MOBIC ?TAKE ONE TABLET BY MOUTH DAILY ?  ?metFORMIN 500 MG 24 hr tablet ?Commonly known as: GLUCOPHAGE-XR ?Take 1 tablet (500 mg total) by mouth 2 (two) times daily. ?  ?ondansetron 8 MG tablet ?Commonly known as: Zofran ?Take 1 tablet (8 mg total) by mouth 2 (two) times daily as needed for refractory nausea / vomiting. Start on day 3 after carboplatin chemo. ?  ?ONE TOUCH ULTRA 2  w/Device Kit ?Check blood sugar as directed. Dx:E11.65 ?  ?OneTouch Verio Flex System w/Device Kit ?USE AS DIRECTED TO CHECK BLOOD SUGAR FOUR TIMES A DAY ?  ?OneTouch Ultra test strip ?Generic drug: glucose blood ?USE TO CHECK BLOOD SUGAR FOUR TIMES A DAY AS DIRECTED ?  ?oxyCODONE-acetaminophen 7.5-325 MG tablet ?Commonly known as: Percocet ?Take 1 tablet by mouth every 6 (six) hours as needed for severe pain. ?  ?prochlorperazine 10 MG tablet ?Commonly known as: COMPAZINE ?Take 1 tablet (10 mg total) by mouth every 6 (six) hours as needed (Nausea or vomiting). ?  ?Trulicity 4.31 VQ/0.0QQ Sopn ?Generic drug: Dulaglutide ?Inject 0.75 mg into the skin every Saturday. ?   ?valACYclovir 1000 MG tablet ?Commonly known as: VALTREX ?TAKE ONE TABLET BY MOUTH DAILY AS NEEDED ?  ? ?  ? ? ?Allergies:  ?Allergies  ?Allergen Reactions  ? Hydrocodone-Acetaminophen Other (See Comments)  ?  Nightmares, skin crawling  ? Lyrica [Pregabalin] Other (See Comments)  ?  Hallucinations   ? Crestor [Rosuvastatin Calcium] Other (See Comments)  ?  Leg cramps  ? ? ?Past Medical History, Surgical history, Social history, and Family History were reviewed and updated. ? ?Review of Systems: ?All other 10 point review of systems is negative.  ? ?Physical Exam: ? vitals were not taken for this visit.  ? ?Wt Readings from Last 3 Encounters:  ?06/06/21 120 lb (54.4 kg)  ?05/28/21 121 lb 3.2 oz (55 kg)  ?05/23/21 120 lb (54.4 kg)  ? ? ?Ocular: Sclerae unicteric, pupils equal, round and reactive to light ?Ear-nose-throat: Oropharynx clear, dentition fair ?Lymphatic: No cervical or supraclavicular adenopathy ?Lungs no rales or rhonchi, good excursion bilaterally ?Heart regular rate and rhythm, no murmur appreciated ?Abd soft, nontender, positive bowel sounds ?MSK no focal spinal tenderness, no joint edema ?Neuro: non-focal, well-oriented, appropriate affect ?Breasts: Deferred ? ?Lab Results  ?Component Value Date  ? WBC 8.8 06/11/2021  ? HGB 13.2 06/11/2021  ? HCT 39.7 06/11/2021  ? MCV 91.1 06/11/2021  ? PLT 259 06/11/2021  ? ?No results found for: FERRITIN, IRON, TIBC, UIBC, IRONPCTSAT ?Lab Results  ?Component Value Date  ? RBC 4.36 06/11/2021  ? ?No results found for: KPAFRELGTCHN, LAMBDASER, KAPLAMBRATIO ?No results found for: IGGSERUM, IGA, IGMSERUM ?No results found for: TOTALPROTELP, ALBUMINELP, A1GS, A2GS, BETS, BETA2SER, GAMS, MSPIKE, SPEI ?  Chemistry   ?   ?Component Value Date/Time  ? NA 135 06/11/2021 0944  ? K 3.6 06/11/2021 0944  ? CL 93 (L) 06/11/2021 0944  ? CO2 30 06/11/2021 0944  ? BUN 11 06/11/2021 0944  ? CREATININE 0.81 06/11/2021 0944  ? CREATININE 0.88 05/08/2021 1126  ?    ?Component Value  Date/Time  ? CALCIUM 10.2 06/11/2021 0944  ? ALKPHOS 88 06/11/2021 0944  ? AST 18 06/11/2021 0944  ? ALT 13 06/11/2021 0944  ? BILITOT 0.3 06/11/2021 0944  ?  ? ? ? ?Impression and Plan: [name] is a very pleasant 71 yo caucasian female with extensive stage small cell lung cancer.  ?She tolerated her first cycle of chemo nicely and continues to do well.  ?We will plan to see her back at the start of cycle 2 on 07/07/2021.  ?She can contact our office with any questions or concerns. We can certainly see her sooner if needed.  ? ?Lottie Dawson, NP ?3/16/20231:46 PM ? ?

## 2021-06-20 ENCOUNTER — Encounter: Payer: Self-pay | Admitting: *Deleted

## 2021-06-20 ENCOUNTER — Other Ambulatory Visit: Payer: PPO

## 2021-06-20 ENCOUNTER — Ambulatory Visit: Payer: PPO | Admitting: Family

## 2021-06-20 ENCOUNTER — Ambulatory Visit: Payer: PPO | Admitting: Hematology & Oncology

## 2021-06-20 ENCOUNTER — Ambulatory Visit: Payer: PPO

## 2021-06-20 LAB — T4: T4, Total: 5.4 ug/dL (ref 4.5–12.0)

## 2021-06-20 LAB — TSH: TSH: 30.242 u[IU]/mL — ABNORMAL HIGH (ref 0.308–3.960)

## 2021-06-20 NOTE — Progress Notes (Signed)
Oncology Nurse Navigator Documentation ? ?Oncology Nurse Navigator Flowsheets 06/20/2021  ?Abnormal Finding Date -  ?Confirmed Diagnosis Date -  ?Diagnosis Status -  ?Navigator Follow Up Date: 07/07/2021  ?Navigator Follow Up Reason: Follow-up Appointment;Chemotherapy  ?Navigator Location [facility]  ?Referral Date to RadOnc/MedOnc -  ?Navigator Encounter Type Appt/Treatment Plan Review  ?Telephone -  ?Patient Visit Type MedOnc  ?Treatment Phase Active Tx  ?Barriers/Navigation Needs Coordination of Care;Education  ?Education -  ?Interventions None Required  ?Acuity Level 2-Minimal Needs (1-2 Barriers Identified)  ?Referrals -  ?Coordination of Care -  ?Education Method -  ?Support Groups/Services Friends and Family  ?Time Spent with Patient 15  ?  ?

## 2021-06-25 ENCOUNTER — Other Ambulatory Visit: Payer: Self-pay

## 2021-06-25 ENCOUNTER — Encounter: Payer: Self-pay | Admitting: Emergency Medicine

## 2021-06-25 ENCOUNTER — Ambulatory Visit: Payer: PPO | Admitting: Emergency Medicine

## 2021-06-25 DIAGNOSIS — C3412 Malignant neoplasm of upper lobe, left bronchus or lung: Secondary | ICD-10-CM

## 2021-06-25 DIAGNOSIS — Z87891 Personal history of nicotine dependence: Secondary | ICD-10-CM | POA: Diagnosis not present

## 2021-06-25 NOTE — Assessment & Plan Note (Signed)
Quit 4 years ago.  No dyspnea.  She does have a dry cough.  Never uses her albuterol.  No clear indication for maintenance BD therapy right now.  If she develops clinical symptoms we will perform pulmonary function testing and consider scheduled inhaled medication. ?

## 2021-06-25 NOTE — Assessment & Plan Note (Signed)
Undergoing chemotherapy regimen with Dr. Marin Olp, repeat imaging as per his plans. ?

## 2021-06-25 NOTE — Progress Notes (Signed)
? ?  Subjective:  ? ? Patient ID: [name], female    DOB: 10-Sep-1950, 71 y.o.   MRN: [id number] ? ?HPI 71 year old woman with history former tobacco use (50 pack years), diabetes, GERD with hiatal hernia, OSA on CPAP, osteoarthritis and DJD.  She was under evaluation for her spine disease and an MRI spine done by orthopedics unfortunately showed evidence for lytic lesions suggestive of malignancy, particularly at L3.  This prompted CT chest 05/14/2021 as below.  She is referred for further evaluation of the scan ? ?She denies any dyspnea. She is active. No cough. No hemoptysis. She does have a lot of back pain, worse lately. She does not wear her CPAP reliably.  ? ?CT chest performed 05/14/2021 reviewed by me, shows a left inferior hilar mass narrowing the left lower lobe bronchus and left mainstem bronchus.  There is significant mediastinal adenopathy involving the right pretracheal, left perihilar region, subcarinal region, AP window.  Subtle pleural nodularity over the left hemidiaphragm was noted.  Also fissural thickening along the left major fissure ? ?PET scan 05/27/2021 reviewed by me shows hypermetabolism in all of these areas.  ? ?ROV 06/25/21 --pleasant 71 year old woman with history of tobacco use who follows up today following bronchoscopy that was performed 06/02/2021.  We evaluated left hilar mass and mediastinal adenopathy, pathology consistent with small cell lung cancer.  Today she reports that she has started chemo w Dr Katheran Awe. She is having a lot of fatigue, no nausea or GI sx. Appetite is up and down. Wt is stable. She has a mild but persistent cough. Is taking loratadine. She never needs albuterol.  ? ? ?Review of Systems ?As per HPI ? ? ?   ?Objective:  ? Physical Exam ?Vitals:  ? 06/25/21 1014  ?BP: 124/70  ?Pulse: 97  ?Temp: 98 ?F (36.7 ?C)  ?TempSrc: Oral  ?SpO2: 93%  ?Weight: 121 lb 3.2 oz (55 kg)  ?Height: 4' 11.5" (1.511 m)  ? ?Gen: Pleasant, well-nourished, in no distress,  normal  affect ? ?ENT: No lesions,  mouth clear,  oropharynx clear, no postnasal drip ? ?Neck: No JVD, no stridor ? ?Lungs: No use of accessory muscles, soft inspiratory squeak on the left.  The right is clear.  No wheezing or crackles ? ?Cardiovascular: RRR, heart sounds normal, no murmur or gallops, no peripheral edema ? ?Musculoskeletal: No deformities, no cyanosis or clubbing ? ?Neuro: alert, awake, non focal ? ?Skin: Warm, no lesions or rash ? ?   ?Assessment & Plan:  ? ?Small cell lung cancer, left upper lobe ([place]) ?Undergoing chemotherapy regimen with Dr. Marin Olp, repeat imaging as per his plans. ? ?History of tobacco use ?Quit 4 years ago.  No dyspnea.  She does have a dry cough.  Never uses her albuterol.  No clear indication for maintenance BD therapy right now.  If she develops clinical symptoms we will perform pulmonary function testing and consider scheduled inhaled medication. ? ?Baltazar Apo, MD, PhD ?06/25/2021, 10:48 AM ?[facility] ?[phone] or if no answer before 7:00PM call [phone] ?For any issues after 7:00PM please call eLink [phone] ? ?

## 2021-06-25 NOTE — Patient Instructions (Signed)
Continue to follow with Dr. Marin Olp as planned. ?Repeat chest imaging per Dr. Antonieta Pert plans. ?Keep albuterol available to use 2 puffs if needed for shortness of breath, chest tightness, wheezing. ?Agree with loratadine 10 mg once daily every day. ?In addition you can try using chlorpheniramine (chlor tabs) 4 mg if needed for mucus, congestion, allergies ?Follow Dr. Lamonte Sakai if needed for any new respiratory issues. ?

## 2021-06-27 ENCOUNTER — Other Ambulatory Visit: Payer: Self-pay | Admitting: *Deleted

## 2021-06-27 MED ORDER — OXYCODONE-ACETAMINOPHEN 7.5-325 MG PO TABS: 1.0000 | ORAL_TABLET | Freq: Four times a day (QID) | ORAL | 0 refills | Status: DC | PRN

## 2021-06-30 ENCOUNTER — Encounter: Payer: Self-pay | Admitting: *Deleted

## 2021-06-30 NOTE — Progress Notes (Signed)
Patient has questions about wig vouchers. Explained that I was unfamiliar with vouchers, but we had two wig retailers that we work for that help file insurance to allow for patient to get wigs for free or discounted price.  ? ?Contact information given for [employer] and a Restaurant manager, fast food. She knows to call us back once a decision is made so that we can provide a prescription.  ? ?Oncology Nurse Navigator Documentation ? ? ?  06/30/2021  ? 11:45 AM  ?Oncology Nurse Navigator Flowsheets  ?Navigator Follow Up Date: 07/07/2021  ?Navigator Follow Up Reason: Follow-up Appointment;Chemotherapy  ?Navigator Location [facility]  ?Navigator Encounter Type Telephone  ?Telephone Incoming Call  ?Patient Visit Type MedOnc  ?Treatment Phase Active Tx  ?Barriers/Navigation Needs Coordination of Care;Education  ?Education Other  ?Interventions Coordination of Care;Education  ?Acuity Level 2-Minimal Needs (1-2 Barriers Identified)  ?Coordination of Care Other  ?Education Method Verbal  ?Support Groups/Services Friends and Family  ?Specialty Items/DME Wigs  ?Time Spent with Patient 15  ?  ?

## 2021-07-02 ENCOUNTER — Other Ambulatory Visit: Payer: Self-pay | Admitting: *Deleted

## 2021-07-02 ENCOUNTER — Encounter: Payer: Self-pay | Admitting: *Deleted

## 2021-07-02 DIAGNOSIS — C778 Secondary and unspecified malignant neoplasm of lymph nodes of multiple regions: Secondary | ICD-10-CM

## 2021-07-02 MED ORDER — UNABLE TO FIND: 0 refills | Status: DC

## 2021-07-02 NOTE — Progress Notes (Signed)
Patient has an appointment at [facility] this Friday at 10am. She would like [place] to send a prescription to the store prior to her appointment.  ? ?Prescription will be sent to [phone] and the store also requests it be emailed to [email or web address].  ? ?Oncology Nurse Navigator Documentation ? ? ?  07/02/2021  ? 12:45 PM  ?Oncology Nurse Navigator Flowsheets  ?Navigator Follow Up Date: 07/07/2021  ?Navigator Follow Up Reason: Follow-up Appointment;Chemotherapy  ?Navigator Location [facility]  ?Navigator Encounter Type Telephone  ?Telephone Incoming Call  ?Patient Visit Type MedOnc  ?Treatment Phase Active Tx  ?Barriers/Navigation Needs Coordination of Care;Education  ?Interventions Other  ?Acuity Level 2-Minimal Needs (1-2 Barriers Identified)  ?Coordination of Care Other  ?Education Method Verbal  ?Support Groups/Services Friends and Family  ?Specialty Items/DME Wigs  ?Time Spent with Patient 30  ?  ? ?

## 2021-07-07 ENCOUNTER — Inpatient Hospital Stay (HOSPITAL_BASED_OUTPATIENT_CLINIC_OR_DEPARTMENT_OTHER): Payer: PPO | Admitting: Hematology & Oncology

## 2021-07-07 ENCOUNTER — Telehealth: Payer: Self-pay | Admitting: Hematology & Oncology

## 2021-07-07 ENCOUNTER — Encounter: Payer: Self-pay | Admitting: Hematology & Oncology

## 2021-07-07 ENCOUNTER — Inpatient Hospital Stay: Payer: PPO

## 2021-07-07 ENCOUNTER — Inpatient Hospital Stay: Payer: PPO | Attending: Hematology & Oncology

## 2021-07-07 ENCOUNTER — Other Ambulatory Visit: Payer: Self-pay

## 2021-07-07 VITALS — BP 116/76 | HR 86 | Temp 98.1°F | Resp 18 | Wt 120.0 lb

## 2021-07-07 VITALS — BP 120/74 | HR 85

## 2021-07-07 DIAGNOSIS — E119 Type 2 diabetes mellitus without complications: Secondary | ICD-10-CM | POA: Diagnosis not present

## 2021-07-07 DIAGNOSIS — C3400 Malignant neoplasm of unspecified main bronchus: Secondary | ICD-10-CM | POA: Diagnosis not present

## 2021-07-07 DIAGNOSIS — C3412 Malignant neoplasm of upper lobe, left bronchus or lung: Secondary | ICD-10-CM | POA: Diagnosis not present

## 2021-07-07 DIAGNOSIS — Z5112 Encounter for antineoplastic immunotherapy: Secondary | ICD-10-CM | POA: Diagnosis not present

## 2021-07-07 DIAGNOSIS — Z5111 Encounter for antineoplastic chemotherapy: Secondary | ICD-10-CM | POA: Insufficient documentation

## 2021-07-07 DIAGNOSIS — C787 Secondary malignant neoplasm of liver and intrahepatic bile duct: Secondary | ICD-10-CM

## 2021-07-07 DIAGNOSIS — Z794 Long term (current) use of insulin: Secondary | ICD-10-CM | POA: Diagnosis not present

## 2021-07-07 DIAGNOSIS — C3490 Malignant neoplasm of unspecified part of unspecified bronchus or lung: Secondary | ICD-10-CM

## 2021-07-07 DIAGNOSIS — Z5189 Encounter for other specified aftercare: Secondary | ICD-10-CM | POA: Diagnosis not present

## 2021-07-07 DIAGNOSIS — E032 Hypothyroidism due to medicaments and other exogenous substances: Secondary | ICD-10-CM

## 2021-07-07 DIAGNOSIS — Z79899 Other long term (current) drug therapy: Secondary | ICD-10-CM | POA: Insufficient documentation

## 2021-07-07 LAB — CBC WITH DIFFERENTIAL (CANCER CENTER ONLY)
Abs Immature Granulocytes: 0.06 10*3/uL (ref 0.00–0.07)
Basophils Absolute: 0.1 10*3/uL (ref 0.0–0.1)
Basophils Relative: 1 %
Eosinophils Absolute: 0 10*3/uL (ref 0.0–0.5)
Eosinophils Relative: 0 %
HCT: 33.8 % — ABNORMAL LOW (ref 36.0–46.0)
Hemoglobin: 11.1 g/dL — ABNORMAL LOW (ref 12.0–15.0)
Immature Granulocytes: 0 %
Lymphocytes Relative: 8 %
Lymphs Abs: 1.1 10*3/uL (ref 0.7–4.0)
MCH: 31.1 pg (ref 26.0–34.0)
MCHC: 32.8 g/dL (ref 30.0–36.0)
MCV: 94.7 fL (ref 80.0–100.0)
Monocytes Absolute: 0.7 10*3/uL (ref 0.1–1.0)
Monocytes Relative: 5 %
Neutro Abs: 11.4 10*3/uL — ABNORMAL HIGH (ref 1.7–7.7)
Neutrophils Relative %: 86 %
Platelet Count: 373 10*3/uL (ref 150–400)
RBC: 3.57 MIL/uL — ABNORMAL LOW (ref 3.87–5.11)
RDW: 14.6 % (ref 11.5–15.5)
WBC Count: 13.4 10*3/uL — ABNORMAL HIGH (ref 4.0–10.5)
nRBC: 0 % (ref 0.0–0.2)

## 2021-07-07 LAB — CMP (CANCER CENTER ONLY)
ALT: 15 U/L (ref 0–44)
AST: 15 U/L (ref 15–41)
Albumin: 4 g/dL (ref 3.5–5.0)
Alkaline Phosphatase: 130 U/L — ABNORMAL HIGH (ref 38–126)
Anion gap: 9 (ref 5–15)
BUN: 19 mg/dL (ref 8–23)
CO2: 26 mmol/L (ref 22–32)
Calcium: 9.2 mg/dL (ref 8.9–10.3)
Chloride: 97 mmol/L — ABNORMAL LOW (ref 98–111)
Creatinine: 0.75 mg/dL (ref 0.44–1.00)
GFR, Estimated: >60 mL/min (ref >60)
Glucose, Bld: 375 mg/dL — ABNORMAL HIGH (ref 70–99)
Potassium: 4 mmol/L (ref 3.5–5.1)
Sodium: 132 mmol/L — ABNORMAL LOW (ref 135–145)
Total Bilirubin: 0.3 mg/dL (ref 0.3–1.2)
Total Protein: 7.1 g/dL (ref 6.5–8.1)

## 2021-07-07 LAB — LACTATE DEHYDROGENASE: LDH: 225 U/L — ABNORMAL HIGH (ref 98–192)

## 2021-07-07 LAB — TSH: TSH: 35.424 u[IU]/mL — ABNORMAL HIGH (ref 0.308–3.960)

## 2021-07-07 MED ORDER — SODIUM CHLORIDE 0.9 % IV SOLN
1200.0000 mg | Freq: Once | INTRAVENOUS | Status: AC
  Administered 2021-07-07: 1200 mg via INTRAVENOUS
  Filled 2021-07-07: qty 20

## 2021-07-07 MED ORDER — SODIUM CHLORIDE 0.9 % IV SOLN: Freq: Once | INTRAVENOUS | Status: AC

## 2021-07-07 MED ORDER — SODIUM CHLORIDE 0.9 % IV SOLN
10.0000 mg | Freq: Once | INTRAVENOUS | Status: AC
  Administered 2021-07-07: 10 mg via INTRAVENOUS
  Filled 2021-07-07: qty 10

## 2021-07-07 MED ORDER — SODIUM CHLORIDE 0.9 % IV SOLN
346.5000 mg | Freq: Once | INTRAVENOUS | Status: AC
  Administered 2021-07-07: 350 mg via INTRAVENOUS
  Filled 2021-07-07: qty 35

## 2021-07-07 MED ORDER — HEPARIN SOD (PORK) LOCK FLUSH 100 UNIT/ML IV SOLN
500.0000 [IU] | Freq: Once | INTRAVENOUS | Status: AC | PRN
  Administered 2021-07-07: 500 [IU]

## 2021-07-07 MED ORDER — PALONOSETRON HCL INJECTION 0.25 MG/5ML
0.2500 mg | Freq: Once | INTRAVENOUS | Status: AC
  Administered 2021-07-07: 0.25 mg via INTRAVENOUS
  Filled 2021-07-07: qty 5

## 2021-07-07 MED ORDER — SODIUM CHLORIDE 0.9 % IV SOLN
80.0000 mg/m2 | Freq: Once | INTRAVENOUS | Status: AC
  Administered 2021-07-07: 120 mg via INTRAVENOUS
  Filled 2021-07-07: qty 6

## 2021-07-07 MED ORDER — SODIUM CHLORIDE 0.9 % IV SOLN
150.0000 mg | Freq: Once | INTRAVENOUS | Status: AC
  Administered 2021-07-07: 150 mg via INTRAVENOUS
  Filled 2021-07-07: qty 150

## 2021-07-07 MED ORDER — SODIUM CHLORIDE 0.9% FLUSH
10.0000 mL | INTRAVENOUS | Status: DC | PRN
  Administered 2021-07-07: 10 mL

## 2021-07-07 MED ORDER — INSULIN ASPART 100 UNIT/ML IJ SOLN
20.0000 [IU] | Freq: Once | INTRAMUSCULAR | Status: AC
  Administered 2021-07-07: 20 [IU] via SUBCUTANEOUS
  Filled 2021-07-07: qty 0.2

## 2021-07-07 NOTE — Progress Notes (Signed)
?Hematology and Oncology Follow Up Visit ? ?[name] ?[id number] ?[birth year] 71 y.o. ?07/07/2021 ? ? ?Principle Diagnosis:  ?Small cell lung cancer -- Extensive stage ?  ?Current Therapy:        ?Carbo/VP-16/Tecentriq -- s/p cycle 1 -- start on 06/11/2021 ?Xgeva 120 mg subcu every 3 months -- next dose on 09/2021 ?  ?Interim History:  [name] is here today for follow-up and treatment.  [name] really did well the first cycle of treatment.  I am very impressed by her resilience.   ? ?[name] had a wonderful birthday over the weekend.  Because of all of her celebrating, her blood sugar is quite high today.  We will add to give her some insulin. ? ?[name] is not having as much pain.  [name] was not taking much in the way of pain medication which is wonderful to see. ? ?[name] has had no cough or shortness of breath.  There is no bleeding.  There is no diarrhea.  [name] has had no leg swelling.  [name] has had no headache. ? ?Overall, I would say performance status right now is ECOG 1.   ? ? ?Medications:  ?Allergies as of 07/07/2021   ? ?   Reactions  ? Hydrocodone-acetaminophen Other (See Comments)  ? Nightmares, skin crawling  ? Lyrica [pregabalin] Other (See Comments)  ? Hallucinations   ? Crestor [rosuvastatin Calcium] Other (See Comments)  ? Leg cramps  ? ?  ? ?  ?Medication List  ?  ? ?  ? Accurate as of July 07, 2021 10:19 AM. If you have any questions, ask your nurse or doctor.  ?  ?  ? ?  ? ?acetaminophen 500 MG tablet ?Commonly known as: TYLENOL ?Take 1,000 mg by mouth every 6 (six) hours as needed for moderate pain. ?  ?albuterol 108 (90 Base) MCG/ACT inhaler ?Commonly known as: VENTOLIN HFA ?Inhale 2 puffs into the lungs every 6 (six) hours as needed for wheezing or shortness of breath. ?  ?ALPRAZolam 0.5 MG tablet ?Commonly known as: Duanne Moron ?TAKE ONE TABLET BY MOUTH THREE TIMES A DAY AS NEEDED ?  ?blood glucose meter kit and supplies Kit ?Use daily ?  ?fentaNYL 12 MCG/HR ?Commonly known as: [place] ?Place 1 patch onto the skin  every 3 (three) days. ?  ?FLUoxetine 20 MG capsule ?Commonly known as: PROZAC ?Take 1 capsule (20 mg total) by mouth daily. ?  ?hydrochlorothiazide 25 MG tablet ?Commonly known as: HYDRODIURIL ?TAKE ONE TABLET BY MOUTH DAILY ?  ?Insulin Pen Needle 32G X 5 MM Misc ?Commonly known as: NovoTwist ?Use daily at Bedtime with Lantus ?  ?Lantus SoloStar 100 UNIT/ML Solostar Pen ?Generic drug: insulin glargine ?Inject 30 Units into the skin daily. HS ?  ?levothyroxine 50 MCG tablet ?Commonly known as: Synthroid ?Take 1 tablet (50 mcg total) by mouth daily before breakfast. ?  ?lidocaine-prilocaine cream ?Commonly known as: EMLA ?Apply to affected area once ?  ?meloxicam 15 MG tablet ?Commonly known as: MOBIC ?TAKE ONE TABLET BY MOUTH DAILY ?  ?metFORMIN 500 MG 24 hr tablet ?Commonly known as: GLUCOPHAGE-XR ?Take 1 tablet (500 mg total) by mouth 2 (two) times daily. ?  ?ondansetron 8 MG tablet ?Commonly known as: Zofran ?Take 1 tablet (8 mg total) by mouth 2 (two) times daily as needed for refractory nausea / vomiting. Start on day 3 after carboplatin chemo. ?  ?ONE TOUCH ULTRA 2 w/Device Kit ?Check blood sugar as directed. Dx:E11.65 ?  ?OneTouch Verio Flex System w/Device  Kit ?USE AS DIRECTED TO CHECK BLOOD SUGAR FOUR TIMES A DAY ?  ?OneTouch Ultra test strip ?Generic drug: glucose blood ?USE TO CHECK BLOOD SUGAR FOUR TIMES A DAY AS DIRECTED ?  ?oxyCODONE-acetaminophen 7.5-325 MG tablet ?Commonly known as: Percocet ?Take 1 tablet by mouth every 6 (six) hours as needed for severe pain. ?  ?prochlorperazine 10 MG tablet ?Commonly known as: COMPAZINE ?Take 1 tablet (10 mg total) by mouth every 6 (six) hours as needed (Nausea or vomiting). ?  ?Trulicity 4.97 WY/6.3ZC Sopn ?Generic drug: Dulaglutide ?Inject 0.75 mg into the skin every Saturday. ?  ?UNABLE TO FIND ?Please provide hair prothesis for patient undergoing chemotherapy for cancer diagnosis. ?  ?valACYclovir 1000 MG tablet ?Commonly known as: VALTREX ?TAKE ONE TABLET BY  MOUTH DAILY AS NEEDED ?  ? ?  ? ? ?Allergies:  ?Allergies  ?Allergen Reactions  ? Hydrocodone-Acetaminophen Other (See Comments)  ?  Nightmares, skin crawling  ? Lyrica [Pregabalin] Other (See Comments)  ?  Hallucinations   ? Crestor [Rosuvastatin Calcium] Other (See Comments)  ?  Leg cramps  ? ? ?Past Medical History, Surgical history, Social history, and Family History were reviewed and updated. ? ?Review of Systems: ?Review of Systems  ?Constitutional: Negative.   ?HENT: Negative.    ?Eyes: Negative.   ?Respiratory: Negative.    ?Cardiovascular: Negative.   ?Gastrointestinal: Negative.   ?Genitourinary: Negative.   ?Musculoskeletal: Negative.   ?Skin: Negative.  Negative for rash.  ?Neurological: Negative.   ?Endo/Heme/Allergies: Negative.   ?Psychiatric/Behavioral: Negative.    ? ? ?Physical Exam: ? weight is 120 lb (54.4 kg). Her oral temperature is 98.1 ?F (36.7 ?C). Her blood pressure is 116/76 and her pulse is 86. Her respiration is 18 and oxygen saturation is 99%.  ? ?Wt Readings from Last 3 Encounters:  ?07/07/21 120 lb (54.4 kg)  ?06/25/21 121 lb 3.2 oz (55 kg)  ?06/19/21 120 lb (54.4 kg)  ? ? ?Physical Exam ?Vitals reviewed.  ?HENT:  ?   Head: Normocephalic and atraumatic.  ?Eyes:  ?   Pupils: Pupils are equal, round, and reactive to light.  ?Cardiovascular:  ?   Rate and Rhythm: Normal rate and regular rhythm.  ?   Heart sounds: Normal heart sounds.  ?Pulmonary:  ?   Effort: Pulmonary effort is normal.  ?   Breath sounds: Normal breath sounds.  ?Abdominal:  ?   General: Bowel sounds are normal.  ?   Palpations: Abdomen is soft.  ?Musculoskeletal:     ?   General: No tenderness or deformity. Normal range of motion.  ?   Cervical back: Normal range of motion.  ?Lymphadenopathy:  ?   Cervical: No cervical adenopathy.  ?Skin: ?   General: Skin is warm and dry.  ?   Findings: No erythema or rash.  ?Neurological:  ?   Mental Status: [name] is alert and oriented to person, place, and time.  ?Psychiatric:     ?    Behavior: Behavior normal.     ?   Thought Content: Thought content normal.     ?   Judgment: Judgment normal.  ? ? ? ?Lab Results  ?Component Value Date  ? WBC 13.4 (H) 07/07/2021  ? HGB 11.1 (L) 07/07/2021  ? HCT 33.8 (L) 07/07/2021  ? MCV 94.7 07/07/2021  ? PLT 373 07/07/2021  ? ?No results found for: FERRITIN, IRON, TIBC, UIBC, IRONPCTSAT ?Lab Results  ?Component Value Date  ? RBC 3.57 (L) 07/07/2021  ? ?No  results found for: KPAFRELGTCHN, LAMBDASER, KAPLAMBRATIO ?No results found for: IGGSERUM, IGA, IGMSERUM ?No results found for: TOTALPROTELP, ALBUMINELP, A1GS, A2GS, BETS, BETA2SER, GAMS, MSPIKE, SPEI ?  Chemistry   ?   ?Component Value Date/Time  ? NA 132 (L) 07/07/2021 0925  ? K 4.0 07/07/2021 0925  ? CL 97 (L) 07/07/2021 0925  ? CO2 26 07/07/2021 0925  ? BUN 19 07/07/2021 0925  ? CREATININE 0.75 07/07/2021 0925  ? CREATININE 0.88 05/08/2021 1126  ?    ?Component Value Date/Time  ? CALCIUM 9.2 07/07/2021 0925  ? ALKPHOS 130 (H) 07/07/2021 0925  ? AST 15 07/07/2021 0925  ? ALT 15 07/07/2021 0925  ? BILITOT 0.3 07/07/2021 0925  ?  ? ? ? ?Impression and Plan: [name] is a very pleasant 71 yo caucasian female with extensive stage small cell lung cancer.  ? ?[name] has had her first cycle of chemotherapy.  I would have to say that [name] has responded.  We will see how well [name] is responded with after the next cycle of treatment.  We will get a PET scan on her to see what kind of improvement [name] has had. ? ?I am just glad that her quality of life is doing well.  I am glad that [name] was able to have a wonderful birthday and enjoyed with her family. ? ?We will plan to get her back in 3 weeks for her third cycle of treatment. ? ?Peter R Ennever, MD ?4/3/202310:19 AM ? ?

## 2021-07-07 NOTE — Patient Instructions (Signed)

## 2021-07-07 NOTE — Telephone Encounter (Signed)
Called to schedule per 4/3 los , left voicemail  ?

## 2021-07-08 ENCOUNTER — Encounter: Payer: Self-pay | Admitting: *Deleted

## 2021-07-08 ENCOUNTER — Telehealth: Payer: Self-pay

## 2021-07-08 ENCOUNTER — Inpatient Hospital Stay: Payer: PPO

## 2021-07-08 VITALS — BP 117/74 | HR 85 | Temp 97.7°F | Resp 18

## 2021-07-08 DIAGNOSIS — C3412 Malignant neoplasm of upper lobe, left bronchus or lung: Secondary | ICD-10-CM

## 2021-07-08 DIAGNOSIS — Z5112 Encounter for antineoplastic immunotherapy: Secondary | ICD-10-CM | POA: Diagnosis not present

## 2021-07-08 LAB — T4: T4, Total: 5 ug/dL (ref 4.5–12.0)

## 2021-07-08 MED ORDER — HEPARIN SOD (PORK) LOCK FLUSH 100 UNIT/ML IV SOLN
500.0000 [IU] | Freq: Once | INTRAVENOUS | Status: AC | PRN
  Administered 2021-07-08: 500 [IU]

## 2021-07-08 MED ORDER — SODIUM CHLORIDE 0.9 % IV SOLN: Freq: Once | INTRAVENOUS | Status: AC

## 2021-07-08 MED ORDER — SODIUM CHLORIDE 0.9% FLUSH
10.0000 mL | INTRAVENOUS | Status: DC | PRN
  Administered 2021-07-08: 10 mL

## 2021-07-08 MED ORDER — SODIUM CHLORIDE 0.9 % IV SOLN
80.0000 mg/m2 | Freq: Once | INTRAVENOUS | Status: AC
  Administered 2021-07-08: 120 mg via INTRAVENOUS
  Filled 2021-07-08: qty 6

## 2021-07-08 MED ORDER — SODIUM CHLORIDE 0.9 % IV SOLN
10.0000 mg | Freq: Once | INTRAVENOUS | Status: AC
  Administered 2021-07-08: 10 mg via INTRAVENOUS
  Filled 2021-07-08: qty 10

## 2021-07-08 NOTE — Progress Notes (Signed)
Patient will need PET prior to her next cycle. PET scheduled for 07/24/21. Education for PET sent via [facility] and will call patient next week to follow up.  ? ?Oncology Nurse Navigator Documentation ? ? ?  07/08/2021  ?  8:00 AM  ?Oncology Nurse Navigator Flowsheets  ?Navigator Follow Up Date: 07/24/2021  ?Navigator Follow Up Reason: Scan Review  ?Navigator Location [facility]  ?Navigator Encounter Type Appt/Treatment Plan Review  ?Patient Visit Type MedOnc  ?Treatment Phase Active Tx  ?Barriers/Navigation Needs Coordination of Care;Education  ?Interventions Coordination of Care;Education  ?Acuity Level 2-Minimal Needs (1-2 Barriers Identified)  ?Coordination of Care Radiology  ?Education Method Written  ?Support Groups/Services Friends and Family  ?Time Spent with Patient 30  ?  ?

## 2021-07-08 NOTE — Patient Instructions (Signed)
[name] [place]  Discharge Instructions: ?Thank you for choosing [facility] to provide your oncology and hematology care.  ? ?If you have a lab appointment with the [facility], please go directly to the [facility] and check in at the registration area. ? ?Wear comfortable clothing and clothing appropriate for easy access to any Portacath or PICC line.  ? ?We strive to give you quality time with your provider. You may need to reschedule your appointment if you arrive late (15 or more minutes).  Arriving late affects you and other patients whose appointments are after yours.  Also, if you miss three or more appointments without notifying the office, you may be dismissed from the clinic at the provider?s discretion.    ?  ?For prescription refill requests, have your pharmacy contact our office and allow 72 hours for refills to be completed.   ? ?Today you received the following chemotherapy and/or immunotherapy agents Etoposide ?    ?  ?To help prevent nausea and vomiting after your treatment, we encourage you to take your nausea medication as directed. ? ?BELOW ARE SYMPTOMS THAT SHOULD BE REPORTED IMMEDIATELY: ?*FEVER GREATER THAN 100.4 F (38 ?C) OR HIGHER ?*CHILLS OR SWEATING ?*NAUSEA AND VOMITING THAT IS NOT CONTROLLED WITH YOUR NAUSEA MEDICATION ?*UNUSUAL SHORTNESS OF BREATH ?*UNUSUAL BRUISING OR BLEEDING ?*URINARY PROBLEMS (pain or burning when urinating, or frequent urination) ?*BOWEL PROBLEMS (unusual diarrhea, constipation, pain near the anus) ?TENDERNESS IN MOUTH AND THROAT WITH OR WITHOUT PRESENCE OF ULCERS (sore throat, sores in mouth, or a toothache) ?UNUSUAL RASH, SWELLING OR PAIN  ?UNUSUAL VAGINAL DISCHARGE OR ITCHING  ? ?Items with * indicate a potential emergency and should be followed up as soon as possible or go to the Emergency Department if any problems should occur. ? ?Please show the CHEMOTHERAPY ALERT CARD or IMMUNOTHERAPY ALERT CARD at check-in to the  Emergency Department and triage nurse. ?Should you have questions after your visit or need to cancel or reschedule your appointment, please contact [facility]  [phone] and follow the prompts.  Office hours are 8:00 a.m. to 4:30 p.m. Monday - Friday. Please note that voicemails left after 4:00 p.m. may not be returned until the following business day.  We are closed weekends and major holidays. You have access to a nurse at all times for urgent questions. Please call the main number to the clinic [phone] and follow the prompts. ? ?For any non-urgent questions, you may also contact your provider using MyChart. We now offer e-Visits for anyone 76 and older to request care online for non-urgent symptoms. For details visit [email or web address] ?  ?Also download the MyChart app! Go to the app store, search "MyChart", open the app, select [facility], and log in with your MyChart username and password. ? ?Due to Covid, a mask is required upon entering the hospital/clinic. If you do not have a mask, one will be given to you upon arrival. For doctor visits, patients may have 1 support person aged 3 or older with them. For treatment visits, patients cannot have anyone with them due to current Covid guidelines and our immunocompromised population.  ?

## 2021-07-08 NOTE — Telephone Encounter (Signed)
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  We need to increase the Synthroid to 100 mcg.  Please call this in.  Thanks.  Pete ?

## 2021-07-08 NOTE — Telephone Encounter (Signed)
Pt was in office today for an infusion. Advised her of lab results and pt is requesting to take 2 of the 50 mcg tablets (to equal 100 mcg) a day until she runs out. She will then call for a new Rx.  ?

## 2021-07-09 ENCOUNTER — Inpatient Hospital Stay: Payer: PPO

## 2021-07-09 VITALS — BP 121/80 | HR 93 | Temp 98.1°F | Resp 18

## 2021-07-09 DIAGNOSIS — C3412 Malignant neoplasm of upper lobe, left bronchus or lung: Secondary | ICD-10-CM

## 2021-07-09 DIAGNOSIS — Z5112 Encounter for antineoplastic immunotherapy: Secondary | ICD-10-CM | POA: Diagnosis not present

## 2021-07-09 MED ORDER — SODIUM CHLORIDE 0.9 % IV SOLN: Freq: Once | INTRAVENOUS | Status: AC

## 2021-07-09 MED ORDER — SODIUM CHLORIDE 0.9% FLUSH
10.0000 mL | INTRAVENOUS | Status: DC | PRN
  Administered 2021-07-09: 10 mL

## 2021-07-09 MED ORDER — HEPARIN SOD (PORK) LOCK FLUSH 100 UNIT/ML IV SOLN
500.0000 [IU] | Freq: Once | INTRAVENOUS | Status: AC | PRN
  Administered 2021-07-09: 500 [IU]

## 2021-07-09 MED ORDER — SODIUM CHLORIDE 0.9 % IV SOLN
10.0000 mg | Freq: Once | INTRAVENOUS | Status: AC
  Administered 2021-07-09: 10 mg via INTRAVENOUS
  Filled 2021-07-09: qty 10

## 2021-07-09 MED ORDER — SODIUM CHLORIDE 0.9 % IV SOLN
80.0000 mg/m2 | Freq: Once | INTRAVENOUS | Status: AC
  Administered 2021-07-09: 120 mg via INTRAVENOUS
  Filled 2021-07-09: qty 6

## 2021-07-09 NOTE — Patient Instructions (Signed)
[facility] [place]  Discharge Instructions: ?Thank you for choosing [facility] to provide your oncology and hematology care.  ? ?If you have a lab appointment with the [facility], please go directly to the [facility] and check in at the registration area. ? ?Wear comfortable clothing and clothing appropriate for easy access to any Portacath or PICC line.  ? ?We strive to give you quality time with your provider. You may need to reschedule your appointment if you arrive late (15 or more minutes).  Arriving late affects you and other patients whose appointments are after yours.  Also, if you miss three or more appointments without notifying the office, you may be dismissed from the clinic at the provider?s discretion.    ?  ?For prescription refill requests, have your pharmacy contact our office and allow 72 hours for refills to be completed.   ? ?Today you received the following chemotherapy and/or immunotherapy agents Etoposide. ?  ?To help prevent nausea and vomiting after your treatment, we encourage you to take your nausea medication as directed. ? ?BELOW ARE SYMPTOMS THAT SHOULD BE REPORTED IMMEDIATELY: ?*FEVER GREATER THAN 100.4 F (38 ?C) OR HIGHER ?*CHILLS OR SWEATING ?*NAUSEA AND VOMITING THAT IS NOT CONTROLLED WITH YOUR NAUSEA MEDICATION ?*UNUSUAL SHORTNESS OF BREATH ?*UNUSUAL BRUISING OR BLEEDING ?*URINARY PROBLEMS (pain or burning when urinating, or frequent urination) ?*BOWEL PROBLEMS (unusual diarrhea, constipation, pain near the anus) ?TENDERNESS IN MOUTH AND THROAT WITH OR WITHOUT PRESENCE OF ULCERS (sore throat, sores in mouth, or a toothache) ?UNUSUAL RASH, SWELLING OR PAIN  ?UNUSUAL VAGINAL DISCHARGE OR ITCHING  ? ?Items with * indicate a potential emergency and should be followed up as soon as possible or go to the Emergency Department if any problems should occur. ? ?Please show the CHEMOTHERAPY ALERT CARD or IMMUNOTHERAPY ALERT CARD at check-in to the  Emergency Department and triage nurse. ?Should you have questions after your visit or need to cancel or reschedule your appointment, please contact [facility]  [phone] and follow the prompts.  Office hours are 8:00 a.m. to 4:30 p.m. Monday - Friday. Please note that voicemails left after 4:00 p.m. may not be returned until the following business day.  We are closed weekends and major holidays. You have access to a nurse at all times for urgent questions. Please call the main number to the clinic [phone] and follow the prompts. ? ?For any non-urgent questions, you may also contact your provider using MyChart. We now offer e-Visits for anyone 49 and older to request care online for non-urgent symptoms. For details visit [email or web address] ?  ?Also download the MyChart app! Go to the app store, search "MyChart", open the app, select [facility], and log in with your MyChart username and password. ? ?Due to Covid, a mask is required upon entering the hospital/clinic. If you do not have a mask, one will be given to you upon arrival. For doctor visits, patients may have 1 support person aged 67 or older with them. For treatment visits, patients cannot have anyone with them due to current Covid guidelines and our immunocompromised population.  ?

## 2021-07-11 ENCOUNTER — Inpatient Hospital Stay: Payer: PPO

## 2021-07-11 VITALS — BP 125/69 | HR 85 | Temp 97.3°F | Resp 20

## 2021-07-11 DIAGNOSIS — Z5112 Encounter for antineoplastic immunotherapy: Secondary | ICD-10-CM | POA: Diagnosis not present

## 2021-07-11 DIAGNOSIS — C3412 Malignant neoplasm of upper lobe, left bronchus or lung: Secondary | ICD-10-CM

## 2021-07-11 MED ORDER — SODIUM CHLORIDE 0.9 % IV SOLN: INTRAVENOUS | Status: DC

## 2021-07-11 MED ORDER — PEGFILGRASTIM-CBQV 6 MG/0.6ML ~~LOC~~ SOSY
6.0000 mg | PREFILLED_SYRINGE | Freq: Once | SUBCUTANEOUS | Status: AC
  Administered 2021-07-11: 6 mg via SUBCUTANEOUS
  Filled 2021-07-11: qty 0.6

## 2021-07-11 NOTE — Patient Instructions (Signed)

## 2021-07-14 ENCOUNTER — Encounter: Payer: Self-pay | Admitting: *Deleted

## 2021-07-14 NOTE — Progress Notes (Signed)
Confirmed with patient that she received MyChart message regarding her PET. She has no questions at this time.  ? ?Radiology information sheet mailed to patient home.  ? ?Oncology Nurse Navigator Documentation ? ? ?  07/14/2021  ?  9:15 AM  ?Oncology Nurse Navigator Flowsheets  ?Navigator Follow Up Date: 07/24/2021  ?Navigator Follow Up Reason: Scan Review  ?Navigator Location [facility]  ?Navigator Encounter Type Telephone  ?Telephone Outgoing Call  ?Patient Visit Type MedOnc  ?Treatment Phase Active Tx  ?Barriers/Navigation Needs Coordination of Care;Education  ?Interventions Education  ?Acuity Level 2-Minimal Needs (1-2 Barriers Identified)  ?Education Method Verbal;Written  ?Support Groups/Services Friends and Family  ?Time Spent with Patient 15  ?  ?

## 2021-07-24 ENCOUNTER — Ambulatory Visit (HOSPITAL_COMMUNITY)
Admission: RE | Admit: 2021-07-24 | Discharge: 2021-07-24 | Disposition: A | Discharge status: Home / Self Care | Payer: PPO | Source: Ambulatory Visit | Attending: Hematology & Oncology | Admitting: Hematology & Oncology

## 2021-07-24 DIAGNOSIS — C3490 Malignant neoplasm of unspecified part of unspecified bronchus or lung: Secondary | ICD-10-CM | POA: Diagnosis not present

## 2021-07-24 DIAGNOSIS — C3412 Malignant neoplasm of upper lobe, left bronchus or lung: Secondary | ICD-10-CM | POA: Insufficient documentation

## 2021-07-24 DIAGNOSIS — I700 Atherosclerosis of aorta: Secondary | ICD-10-CM | POA: Diagnosis not present

## 2021-07-24 DIAGNOSIS — I2510 Atherosclerotic heart disease of native coronary artery without angina pectoris: Secondary | ICD-10-CM | POA: Diagnosis not present

## 2021-07-24 DIAGNOSIS — C7951 Secondary malignant neoplasm of bone: Secondary | ICD-10-CM | POA: Diagnosis not present

## 2021-07-24 LAB — GLUCOSE, CAPILLARY: Glucose-Capillary: 292 mg/dL — ABNORMAL HIGH (ref 70–99)

## 2021-07-24 MED ORDER — FLUDEOXYGLUCOSE F - 18 (FDG) INJECTION
6.0000 | Freq: Once | INTRAVENOUS | Status: AC | PRN
  Administered 2021-07-24: 6 via INTRAVENOUS

## 2021-07-25 ENCOUNTER — Encounter: Payer: Self-pay | Admitting: *Deleted

## 2021-07-25 NOTE — Progress Notes (Signed)
Oncology Nurse Navigator Documentation ? ? ?  07/25/2021  ? 10:15 AM  ?Oncology Nurse Navigator Flowsheets  ?Navigator Follow Up Date: 07/28/2021  ?Navigator Follow Up Reason: Follow-up Appointment;Chemotherapy  ?Navigator Location [facility]  ?Navigator Encounter Type Scan Review  ?Patient Visit Type MedOnc  ?Treatment Phase Active Tx  ?Barriers/Navigation Needs Coordination of Care;Education  ?Interventions None Required  ?Acuity Level 2-Minimal Needs (1-2 Barriers Identified)  ?Support Groups/Services Friends and Family  ?Time Spent with Patient 15  ?  ?

## 2021-07-28 ENCOUNTER — Encounter: Payer: Self-pay | Admitting: *Deleted

## 2021-07-28 ENCOUNTER — Encounter: Payer: Self-pay | Admitting: Hematology & Oncology

## 2021-07-28 ENCOUNTER — Inpatient Hospital Stay: Payer: PPO

## 2021-07-28 ENCOUNTER — Inpatient Hospital Stay (HOSPITAL_BASED_OUTPATIENT_CLINIC_OR_DEPARTMENT_OTHER): Payer: PPO | Admitting: Hematology & Oncology

## 2021-07-28 VITALS — HR 90

## 2021-07-28 VITALS — BP 94/65 | HR 102 | Temp 97.8°F | Resp 20 | Wt 120.1 lb

## 2021-07-28 DIAGNOSIS — C3412 Malignant neoplasm of upper lobe, left bronchus or lung: Secondary | ICD-10-CM

## 2021-07-28 DIAGNOSIS — Z5112 Encounter for antineoplastic immunotherapy: Secondary | ICD-10-CM | POA: Diagnosis not present

## 2021-07-28 LAB — TSH: TSH: 10.51 u[IU]/mL — ABNORMAL HIGH (ref 0.350–4.500)

## 2021-07-28 LAB — CBC WITH DIFFERENTIAL (CANCER CENTER ONLY)
Abs Immature Granulocytes: 0.05 10*3/uL (ref 0.00–0.07)
Basophils Absolute: 0 10*3/uL (ref 0.0–0.1)
Basophils Relative: 0 %
Eosinophils Absolute: 0.1 10*3/uL (ref 0.0–0.5)
Eosinophils Relative: 1 %
HCT: 32.2 % — ABNORMAL LOW (ref 36.0–46.0)
Hemoglobin: 10.6 g/dL — ABNORMAL LOW (ref 12.0–15.0)
Immature Granulocytes: 1 %
Lymphocytes Relative: 14 %
Lymphs Abs: 1.3 10*3/uL (ref 0.7–4.0)
MCH: 31.9 pg (ref 26.0–34.0)
MCHC: 32.9 g/dL (ref 30.0–36.0)
MCV: 97 fL (ref 80.0–100.0)
Monocytes Absolute: 0.7 10*3/uL (ref 0.1–1.0)
Monocytes Relative: 7 %
Neutro Abs: 7.6 10*3/uL (ref 1.7–7.7)
Neutrophils Relative %: 77 %
Platelet Count: 277 10*3/uL (ref 150–400)
RBC: 3.32 MIL/uL — ABNORMAL LOW (ref 3.87–5.11)
RDW: 15.9 % — ABNORMAL HIGH (ref 11.5–15.5)
WBC Count: 9.8 10*3/uL (ref 4.0–10.5)
nRBC: 0 % (ref 0.0–0.2)

## 2021-07-28 LAB — CMP (CANCER CENTER ONLY)
ALT: 12 U/L (ref 0–44)
AST: 13 U/L — ABNORMAL LOW (ref 15–41)
Albumin: 3.9 g/dL (ref 3.5–5.0)
Alkaline Phosphatase: 96 U/L (ref 38–126)
Anion gap: 9 (ref 5–15)
BUN: 15 mg/dL (ref 8–23)
CO2: 29 mmol/L (ref 22–32)
Calcium: 9.4 mg/dL (ref 8.9–10.3)
Chloride: 101 mmol/L (ref 98–111)
Creatinine: 0.73 mg/dL (ref 0.44–1.00)
GFR, Estimated: >60 mL/min (ref >60)
Glucose, Bld: 172 mg/dL — ABNORMAL HIGH (ref 70–99)
Potassium: 3.7 mmol/L (ref 3.5–5.1)
Sodium: 139 mmol/L (ref 135–145)
Total Bilirubin: 0.3 mg/dL (ref 0.3–1.2)
Total Protein: 7 g/dL (ref 6.5–8.1)

## 2021-07-28 LAB — LACTATE DEHYDROGENASE: LDH: 188 U/L (ref 98–192)

## 2021-07-28 MED ORDER — SODIUM CHLORIDE 0.9 % IV SOLN
346.5000 mg | Freq: Once | INTRAVENOUS | Status: AC
  Administered 2021-07-28: 350 mg via INTRAVENOUS
  Filled 2021-07-28: qty 35

## 2021-07-28 MED ORDER — SODIUM CHLORIDE 0.9 % IV SOLN: Freq: Once | INTRAVENOUS | Status: AC

## 2021-07-28 MED ORDER — SODIUM CHLORIDE 0.9 % IV SOLN
80.0000 mg/m2 | Freq: Once | INTRAVENOUS | Status: AC
  Administered 2021-07-28: 120 mg via INTRAVENOUS
  Filled 2021-07-28: qty 6

## 2021-07-28 MED ORDER — HEPARIN SOD (PORK) LOCK FLUSH 100 UNIT/ML IV SOLN
500.0000 [IU] | Freq: Once | INTRAVENOUS | Status: AC | PRN
  Administered 2021-07-28: 500 [IU]

## 2021-07-28 MED ORDER — SODIUM CHLORIDE 0.9% FLUSH
10.0000 mL | INTRAVENOUS | Status: DC | PRN
  Administered 2021-07-28: 10 mL

## 2021-07-28 MED ORDER — SODIUM CHLORIDE 0.9 % IV SOLN
10.0000 mg | Freq: Once | INTRAVENOUS | Status: AC
  Administered 2021-07-28: 10 mg via INTRAVENOUS
  Filled 2021-07-28: qty 10

## 2021-07-28 MED ORDER — PALONOSETRON HCL INJECTION 0.25 MG/5ML
0.2500 mg | Freq: Once | INTRAVENOUS | Status: AC
  Administered 2021-07-28: 0.25 mg via INTRAVENOUS
  Filled 2021-07-28: qty 5

## 2021-07-28 MED ORDER — SODIUM CHLORIDE 0.9 % IV SOLN
150.0000 mg | Freq: Once | INTRAVENOUS | Status: AC
  Administered 2021-07-28: 150 mg via INTRAVENOUS
  Filled 2021-07-28: qty 150

## 2021-07-28 MED ORDER — SODIUM CHLORIDE 0.9 % IV SOLN
1200.0000 mg | Freq: Once | INTRAVENOUS | Status: AC
  Administered 2021-07-28: 1200 mg via INTRAVENOUS
  Filled 2021-07-28: qty 20

## 2021-07-28 MED ORDER — LEVOTHYROXINE SODIUM 100 MCG PO TABS: 100.0000 ug | ORAL_TABLET | Freq: Every day | ORAL | 4 refills | Status: DC

## 2021-07-28 NOTE — Progress Notes (Signed)
Patient is doing well. She is very happy with the results of her recent PET. She had mentioned some difficulty with eating to Dr Marin Olp. I spoke to her about a consult with a dietician. At this time she declines, but states if anything changes she'll let me know. She states that she can eat, she just has some issues with certain textures that she is working around.  ? ?Oncology Nurse Navigator Documentation ? ? ?  07/28/2021  ?  9:45 AM  ?Oncology Nurse Navigator Flowsheets  ?Navigator Follow Up Date: 08/18/2021  ?Navigator Follow Up Reason: Follow-up Appointment;Chemotherapy  ?Navigator Location [facility]  ?Navigator Encounter Type Treatment;Appt/Treatment Plan Review  ?Patient Visit Type MedOnc  ?Treatment Phase Active Tx  ?Barriers/Navigation Needs Coordination of Care;Education  ?Interventions Psycho-Social Support  ?Acuity Level 2-Minimal Needs (1-2 Barriers Identified)  ?Support Groups/Services Friends and Family  ?Time Spent with Patient 15  ?  ?

## 2021-07-28 NOTE — Patient Instructions (Signed)

## 2021-07-28 NOTE — Addendum Note (Signed)
Addended by: Burney Gauze R on: 07/28/2021 10:23 AM ? ? Modules accepted: Orders ? ?

## 2021-07-28 NOTE — Progress Notes (Signed)
Ok to treat with HR 102. ? Retaken HR 90 ? ?

## 2021-07-28 NOTE — Progress Notes (Signed)
?Hematology and Oncology Follow Up Visit ? ?[name] ?[id number] ?11-20-1950 71 y.o. ?07/28/2021 ? ? ?Principle Diagnosis:  ?Small cell lung cancer -- Extensive stage ?  ?Current Therapy:        ?Carbo/VP-16/Tecentriq -- s/p cycle 2 -- start on 06/11/2021 ?Xgeva 120 mg subcu every 3 months -- next dose on 09/2021 ?  ?Interim History:  [name] is here today for follow-up and treatment.  She had a PET scan done last week.  To no surprise, the PET scan showed that she had a fantastic response.  Most of her disease has responded and is resolved. ? ?She feels well.  She is tolerated treatment well.  She has had no problems with nausea or vomiting.  She may have little bit of constipation. ? ?Her husband says that she is not eating all that well.  She says that her taste buds are somewhat dulled.  I understand this.  I told her that she has to have 4 or 5 small meals a day. ? ?She has had no issues with fever.  There is been no rashes.  She has had no headache.  There is been no obvious mouth sores. ? ?Overall, I would say performance status is probably ECOG 1.   ? ? ?Medications:  ?Allergies as of 07/28/2021   ? ?   Reactions  ? Hydrocodone-acetaminophen Other (See Comments)  ? Nightmares, skin crawling  ? Lyrica [pregabalin] Other (See Comments)  ? Hallucinations   ? Crestor [rosuvastatin Calcium] Other (See Comments)  ? Leg cramps  ? ?  ? ?  ?Medication List  ?  ? ?  ? Accurate as of July 28, 2021 10:01 AM. If you have any questions, ask your nurse or doctor.  ?  ?  ? ?  ? ?acetaminophen 500 MG tablet ?Commonly known as: TYLENOL ?Take 1,000 mg by mouth every 6 (six) hours as needed for moderate pain. ?  ?albuterol 108 (90 Base) MCG/ACT inhaler ?Commonly known as: VENTOLIN HFA ?Inhale 2 puffs into the lungs every 6 (six) hours as needed for wheezing or shortness of breath. ?  ?ALPRAZolam 0.5 MG tablet ?Commonly known as: Duanne Moron ?TAKE ONE TABLET BY MOUTH THREE TIMES A DAY AS NEEDED ?  ?blood glucose meter kit and  supplies Kit ?Use daily ?  ?fentaNYL 12 MCG/HR ?Commonly known as: [place] ?Place 1 patch onto the skin every 3 (three) days. ?  ?FLUoxetine 20 MG capsule ?Commonly known as: PROZAC ?Take 1 capsule (20 mg total) by mouth daily. ?  ?hydrochlorothiazide 25 MG tablet ?Commonly known as: HYDRODIURIL ?TAKE ONE TABLET BY MOUTH DAILY ?  ?Insulin Pen Needle 32G X 5 MM Misc ?Commonly known as: NovoTwist ?Use daily at Bedtime with Lantus ?  ?Lantus SoloStar 100 UNIT/ML Solostar Pen ?Generic drug: insulin glargine ?Inject 30 Units into the skin daily. HS ?  ?levothyroxine 50 MCG tablet ?Commonly known as: Synthroid ?Take 1 tablet (50 mcg total) by mouth daily before breakfast. ?What changed: how much to take ?  ?lidocaine-prilocaine cream ?Commonly known as: EMLA ?Apply to affected area once ?  ?meloxicam 15 MG tablet ?Commonly known as: MOBIC ?TAKE ONE TABLET BY MOUTH DAILY ?  ?metFORMIN 500 MG 24 hr tablet ?Commonly known as: GLUCOPHAGE-XR ?Take 1 tablet (500 mg total) by mouth 2 (two) times daily. ?  ?ondansetron 8 MG tablet ?Commonly known as: Zofran ?Take 1 tablet (8 mg total) by mouth 2 (two) times daily as needed for refractory nausea / vomiting. Start on  day 3 after carboplatin chemo. ?  ?ONE TOUCH ULTRA 2 w/Device Kit ?Check blood sugar as directed. Dx:E11.65 ?  ?OneTouch Verio Flex System w/Device Kit ?USE AS DIRECTED TO CHECK BLOOD SUGAR FOUR TIMES A DAY ?  ?OneTouch Ultra test strip ?Generic drug: glucose blood ?USE TO CHECK BLOOD SUGAR FOUR TIMES A DAY AS DIRECTED ?  ?oxyCODONE-acetaminophen 7.5-325 MG tablet ?Commonly known as: Percocet ?Take 1 tablet by mouth every 6 (six) hours as needed for severe pain. ?  ?prochlorperazine 10 MG tablet ?Commonly known as: COMPAZINE ?Take 1 tablet (10 mg total) by mouth every 6 (six) hours as needed (Nausea or vomiting). ?  ?Trulicity 3.47 QQ/5.9DG Sopn ?Generic drug: Dulaglutide ?Inject 0.75 mg into the skin every Saturday. ?  ?UNABLE TO FIND ?Please provide hair prothesis  for patient undergoing chemotherapy for cancer diagnosis. ?  ?valACYclovir 1000 MG tablet ?Commonly known as: VALTREX ?TAKE ONE TABLET BY MOUTH DAILY AS NEEDED ?  ? ?  ? ? ?Allergies:  ?Allergies  ?Allergen Reactions  ? Hydrocodone-Acetaminophen Other (See Comments)  ?  Nightmares, skin crawling  ? Lyrica [Pregabalin] Other (See Comments)  ?  Hallucinations   ? Crestor [Rosuvastatin Calcium] Other (See Comments)  ?  Leg cramps  ? ? ?Past Medical History, Surgical history, Social history, and Family History were reviewed and updated. ? ?Review of Systems: ?Review of Systems  ?Constitutional: Negative.   ?HENT: Negative.    ?Eyes: Negative.   ?Respiratory: Negative.    ?Cardiovascular: Negative.   ?Gastrointestinal: Negative.   ?Genitourinary: Negative.   ?Musculoskeletal: Negative.   ?Skin: Negative.  Negative for rash.  ?Neurological: Negative.   ?Endo/Heme/Allergies: Negative.   ?Psychiatric/Behavioral: Negative.    ? ? ?Physical Exam: ? weight is 120 lb 1.9 oz (54.5 kg). Her oral temperature is 97.8 ?F (36.6 ?C). Her blood pressure is 94/65 and her pulse is 102 (abnormal). Her respiration is 20 and oxygen saturation is 100%.  ? ?Wt Readings from Last 3 Encounters:  ?07/28/21 120 lb 1.9 oz (54.5 kg)  ?07/07/21 120 lb (54.4 kg)  ?06/25/21 121 lb 3.2 oz (55 kg)  ? ? ?Physical Exam ?Vitals reviewed.  ?HENT:  ?   Head: Normocephalic and atraumatic.  ?Eyes:  ?   Pupils: Pupils are equal, round, and reactive to light.  ?Cardiovascular:  ?   Rate and Rhythm: Normal rate and regular rhythm.  ?   Heart sounds: Normal heart sounds.  ?Pulmonary:  ?   Effort: Pulmonary effort is normal.  ?   Breath sounds: Normal breath sounds.  ?Abdominal:  ?   General: Bowel sounds are normal.  ?   Palpations: Abdomen is soft.  ?Musculoskeletal:     ?   General: No tenderness or deformity. Normal range of motion.  ?   Cervical back: Normal range of motion.  ?Lymphadenopathy:  ?   Cervical: No cervical adenopathy.  ?Skin: ?   General: Skin is  warm and dry.  ?   Findings: No erythema or rash.  ?Neurological:  ?   Mental Status: She is alert and oriented to person, place, and time.  ?Psychiatric:     ?   Behavior: Behavior normal.     ?   Thought Content: Thought content normal.     ?   Judgment: Judgment normal.  ? ? ? ?Lab Results  ?Component Value Date  ? WBC 9.8 07/28/2021  ? HGB 10.6 (L) 07/28/2021  ? HCT 32.2 (L) 07/28/2021  ? MCV 97.0 07/28/2021  ?  PLT 277 07/28/2021  ? ?No results found for: FERRITIN, IRON, TIBC, UIBC, IRONPCTSAT ?Lab Results  ?Component Value Date  ? RBC 3.32 (L) 07/28/2021  ? ?No results found for: KPAFRELGTCHN, LAMBDASER, KAPLAMBRATIO ?No results found for: IGGSERUM, IGA, IGMSERUM ?No results found for: TOTALPROTELP, ALBUMINELP, A1GS, A2GS, BETS, BETA2SER, GAMS, MSPIKE, SPEI ?  Chemistry   ?   ?Component Value Date/Time  ? NA 139 07/28/2021 0850  ? K 3.7 07/28/2021 0850  ? CL 101 07/28/2021 0850  ? CO2 29 07/28/2021 0850  ? BUN 15 07/28/2021 0850  ? CREATININE 0.73 07/28/2021 0850  ? CREATININE 0.88 05/08/2021 1126  ?    ?Component Value Date/Time  ? CALCIUM 9.4 07/28/2021 0850  ? ALKPHOS 96 07/28/2021 0850  ? AST 13 (L) 07/28/2021 0850  ? ALT 12 07/28/2021 0850  ? BILITOT 0.3 07/28/2021 0850  ?  ? ? ? ?Impression and Plan: Ms. Lajeunesse is a very pleasant 71 yo caucasian female with extensive stage small cell lung cancer.  ? ?She had a very nice response to treatment.  I am very impressed with her.  Her resilience is incredibly tough. ? ?We will go ahead with her third cycle of treatment.  We will try for a total of 6 cycles of treatment and then do immunotherapy after that. ? ?We will plan to get her back in 3 more weeks for her fourth cycle. ? ? ?[name], MD ?4/24/202310:01 AM ? ?

## 2021-07-29 ENCOUNTER — Inpatient Hospital Stay: Payer: PPO

## 2021-07-29 VITALS — BP 119/61 | Temp 98.2°F | Resp 18

## 2021-07-29 DIAGNOSIS — Z5112 Encounter for antineoplastic immunotherapy: Secondary | ICD-10-CM | POA: Diagnosis not present

## 2021-07-29 DIAGNOSIS — C3412 Malignant neoplasm of upper lobe, left bronchus or lung: Secondary | ICD-10-CM

## 2021-07-29 LAB — T4: T4, Total: 6.9 ug/dL (ref 4.5–12.0)

## 2021-07-29 MED ORDER — SODIUM CHLORIDE 0.9 % IV SOLN
10.0000 mg | Freq: Once | INTRAVENOUS | Status: AC
  Administered 2021-07-29: 10 mg via INTRAVENOUS
  Filled 2021-07-29: qty 10

## 2021-07-29 MED ORDER — SODIUM CHLORIDE 0.9 % IV SOLN
80.0000 mg/m2 | Freq: Once | INTRAVENOUS | Status: AC
  Administered 2021-07-29: 120 mg via INTRAVENOUS
  Filled 2021-07-29: qty 6

## 2021-07-29 MED ORDER — SODIUM CHLORIDE 0.9 % IV SOLN: Freq: Once | INTRAVENOUS | Status: AC

## 2021-07-29 MED ORDER — HEPARIN SOD (PORK) LOCK FLUSH 100 UNIT/ML IV SOLN
500.0000 [IU] | Freq: Once | INTRAVENOUS | Status: AC | PRN
  Administered 2021-07-29: 500 [IU]

## 2021-07-29 MED ORDER — SODIUM CHLORIDE 0.9% FLUSH
10.0000 mL | INTRAVENOUS | Status: DC | PRN
  Administered 2021-07-29: 10 mL

## 2021-07-29 NOTE — Patient Instructions (Signed)
[facility] [place]  Discharge Instructions: ?Thank you for choosing [facility] to provide your oncology and hematology care.  ? ?If you have a lab appointment with the [facility], please go directly to the [facility] and check in at the registration area. ? ?Wear comfortable clothing and clothing appropriate for easy access to any Portacath or PICC line.  ? ?We strive to give you quality time with your provider. You may need to reschedule your appointment if you arrive late (15 or more minutes).  Arriving late affects you and other patients whose appointments are after yours.  Also, if you miss three or more appointments without notifying the office, you may be dismissed from the clinic at the provider?s discretion.    ?  ?For prescription refill requests, have your pharmacy contact our office and allow 72 hours for refills to be completed.   ? ?Today you received the following chemotherapy and/or immunotherapy agents Etoposide.  ?  ?To help prevent nausea and vomiting after your treatment, we encourage you to take your nausea medication as directed. ? ?BELOW ARE SYMPTOMS THAT SHOULD BE REPORTED IMMEDIATELY: ?*FEVER GREATER THAN 100.4 F (38 ?C) OR HIGHER ?*CHILLS OR SWEATING ?*NAUSEA AND VOMITING THAT IS NOT CONTROLLED WITH YOUR NAUSEA MEDICATION ?*UNUSUAL SHORTNESS OF BREATH ?*UNUSUAL BRUISING OR BLEEDING ?*URINARY PROBLEMS (pain or burning when urinating, or frequent urination) ?*BOWEL PROBLEMS (unusual diarrhea, constipation, pain near the anus) ?TENDERNESS IN MOUTH AND THROAT WITH OR WITHOUT PRESENCE OF ULCERS (sore throat, sores in mouth, or a toothache) ?UNUSUAL RASH, SWELLING OR PAIN  ?UNUSUAL VAGINAL DISCHARGE OR ITCHING  ? ?Items with * indicate a potential emergency and should be followed up as soon as possible or go to the Emergency Department if any problems should occur. ? ?Please show the CHEMOTHERAPY ALERT CARD or IMMUNOTHERAPY ALERT CARD at check-in to the  Emergency Department and triage nurse. ?Should you have questions after your visit or need to cancel or reschedule your appointment, please contact [facility]  [phone] and follow the prompts.  Office hours are 8:00 a.m. to 4:30 p.m. Monday - Friday. Please note that voicemails left after 4:00 p.m. may not be returned until the following business day.  We are closed weekends and major holidays. You have access to a nurse at all times for urgent questions. Please call the main number to the clinic [phone] and follow the prompts. ? ?For any non-urgent questions, you may also contact your provider using MyChart. We now offer e-Visits for anyone 74 and older to request care online for non-urgent symptoms. For details visit [email or web address] ?  ?Also download the MyChart app! Go to the app store, search "MyChart", open the app, select [facility], and log in with your MyChart username and password. ? ?Due to Covid, a mask is required upon entering the hospital/clinic. If you do not have a mask, one will be given to you upon arrival. For doctor visits, patients may have 1 support person aged 25 or older with them. For treatment visits, patients cannot have anyone with them due to current Covid guidelines and our immunocompromised population.  ?

## 2021-07-30 ENCOUNTER — Inpatient Hospital Stay: Payer: PPO

## 2021-07-30 VITALS — BP 113/69 | HR 99 | Temp 98.0°F | Resp 17

## 2021-07-30 DIAGNOSIS — C3412 Malignant neoplasm of upper lobe, left bronchus or lung: Secondary | ICD-10-CM

## 2021-07-30 DIAGNOSIS — Z5112 Encounter for antineoplastic immunotherapy: Secondary | ICD-10-CM | POA: Diagnosis not present

## 2021-07-30 MED ORDER — HEPARIN SOD (PORK) LOCK FLUSH 100 UNIT/ML IV SOLN
500.0000 [IU] | Freq: Once | INTRAVENOUS | Status: AC | PRN
  Administered 2021-07-30: 500 [IU]

## 2021-07-30 MED ORDER — SODIUM CHLORIDE 0.9% FLUSH
10.0000 mL | INTRAVENOUS | Status: DC | PRN
  Administered 2021-07-30 (×2): 10 mL

## 2021-07-30 MED ORDER — SODIUM CHLORIDE 0.9 % IV SOLN: Freq: Once | INTRAVENOUS | Status: AC

## 2021-07-30 MED ORDER — SODIUM CHLORIDE 0.9 % IV SOLN
80.0000 mg/m2 | Freq: Once | INTRAVENOUS | Status: AC
  Administered 2021-07-30: 120 mg via INTRAVENOUS
  Filled 2021-07-30: qty 6

## 2021-07-30 MED ORDER — HEPARIN SOD (PORK) LOCK FLUSH 100 UNIT/ML IV SOLN
250.0000 [IU] | Freq: Once | INTRAVENOUS | Status: AC | PRN
  Administered 2021-07-30: 500 [IU]

## 2021-07-30 MED ORDER — ALTEPLASE 2 MG IJ SOLR: 2.0000 mg | Freq: Once | INTRAMUSCULAR | Status: DC | PRN

## 2021-07-30 MED ORDER — SODIUM CHLORIDE 0.9% FLUSH: 3.0000 mL | INTRAVENOUS | Status: DC | PRN

## 2021-07-30 MED ORDER — SODIUM CHLORIDE 0.9 % IV SOLN
10.0000 mg | Freq: Once | INTRAVENOUS | Status: AC
  Administered 2021-07-30: 10 mg via INTRAVENOUS
  Filled 2021-07-30: qty 10

## 2021-07-30 NOTE — Patient Instructions (Signed)
[facility] [place]  Discharge Instructions: ?Thank you for choosing [facility] to provide your oncology and hematology care.  ? ?If you have a lab appointment with the [facility], please go directly to the [facility] and check in at the registration area. ? ?Wear comfortable clothing and clothing appropriate for easy access to any Portacath or PICC line.  ? ?We strive to give you quality time with your provider. You may need to reschedule your appointment if you arrive late (15 or more minutes).  Arriving late affects you and other patients whose appointments are after yours.  Also, if you miss three or more appointments without notifying the office, you may be dismissed from the clinic at the provider?s discretion.    ?  ?For prescription refill requests, have your pharmacy contact our office and allow 72 hours for refills to be completed.   ? ?Today you received the following chemotherapy and/or immunotherapy agents Etoposide. ?  ?To help prevent nausea and vomiting after your treatment, we encourage you to take your nausea medication as directed. ? ?BELOW ARE SYMPTOMS THAT SHOULD BE REPORTED IMMEDIATELY: ?*FEVER GREATER THAN 100.4 F (38 ?C) OR HIGHER ?*CHILLS OR SWEATING ?*NAUSEA AND VOMITING THAT IS NOT CONTROLLED WITH YOUR NAUSEA MEDICATION ?*UNUSUAL SHORTNESS OF BREATH ?*UNUSUAL BRUISING OR BLEEDING ?*URINARY PROBLEMS (pain or burning when urinating, or frequent urination) ?*BOWEL PROBLEMS (unusual diarrhea, constipation, pain near the anus) ?TENDERNESS IN MOUTH AND THROAT WITH OR WITHOUT PRESENCE OF ULCERS (sore throat, sores in mouth, or a toothache) ?UNUSUAL RASH, SWELLING OR PAIN  ?UNUSUAL VAGINAL DISCHARGE OR ITCHING  ? ?Items with * indicate a potential emergency and should be followed up as soon as possible or go to the Emergency Department if any problems should occur. ? ?Please show the CHEMOTHERAPY ALERT CARD or IMMUNOTHERAPY ALERT CARD at check-in to the  Emergency Department and triage nurse. ?Should you have questions after your visit or need to cancel or reschedule your appointment, please contact [facility]  [phone] and follow the prompts.  Office hours are 8:00 a.m. to 4:30 p.m. Monday - Friday. Please note that voicemails left after 4:00 p.m. may not be returned until the following business day.  We are closed weekends and major holidays. You have access to a nurse at all times for urgent questions. Please call the main number to the clinic [phone] and follow the prompts. ? ?For any non-urgent questions, you may also contact your provider using MyChart. We now offer e-Visits for anyone 13 and older to request care online for non-urgent symptoms. For details visit [email or web address] ?  ?Also download the MyChart app! Go to the app store, search "MyChart", open the app, select [facility], and log in with your MyChart username and password. ? ?Due to Covid, a mask is required upon entering the hospital/clinic. If you do not have a mask, one will be given to you upon arrival. For doctor visits, patients may have 1 support person aged 60 or older with them. For treatment visits, patients cannot have anyone with them due to current Covid guidelines and our immunocompromised population.  ?

## 2021-08-01 ENCOUNTER — Encounter: Payer: Self-pay | Admitting: Hematology & Oncology

## 2021-08-01 ENCOUNTER — Inpatient Hospital Stay: Payer: PPO

## 2021-08-01 VITALS — BP 88/64 | HR 105 | Temp 98.0°F | Resp 17

## 2021-08-01 DIAGNOSIS — Z5112 Encounter for antineoplastic immunotherapy: Secondary | ICD-10-CM | POA: Diagnosis not present

## 2021-08-01 DIAGNOSIS — C3412 Malignant neoplasm of upper lobe, left bronchus or lung: Secondary | ICD-10-CM

## 2021-08-01 MED ORDER — PEGFILGRASTIM-CBQV 6 MG/0.6ML ~~LOC~~ SOSY
6.0000 mg | PREFILLED_SYRINGE | Freq: Once | SUBCUTANEOUS | Status: AC
  Administered 2021-08-01: 6 mg via SUBCUTANEOUS
  Filled 2021-08-01: qty 0.6

## 2021-08-04 ENCOUNTER — Encounter: Payer: Self-pay | Admitting: Hematology & Oncology

## 2021-08-04 ENCOUNTER — Other Ambulatory Visit (HOSPITAL_BASED_OUTPATIENT_CLINIC_OR_DEPARTMENT_OTHER): Payer: Self-pay

## 2021-08-04 ENCOUNTER — Other Ambulatory Visit: Payer: Self-pay

## 2021-08-04 DIAGNOSIS — K121 Other forms of stomatitis: Secondary | ICD-10-CM

## 2021-08-04 MED ORDER — NYSTATIN 100000 UNIT/ML MT SUSP
OROMUCOSAL | 0 refills | Status: DC
  Filled 2021-08-04: qty 240, 6d supply, fill #0

## 2021-08-04 MED ORDER — AMOXICILLIN-POT CLAVULANATE 875-125 MG PO TABS
1.0000 | ORAL_TABLET | Freq: Two times a day (BID) | ORAL | 0 refills | Status: DC
  Filled 2021-08-04: qty 14, 7d supply, fill #0

## 2021-08-04 MED ORDER — MAGIC MOUTHWASH W/LIDOCAINE: 5.0000 mL | Freq: Four times a day (QID) | ORAL | 0 refills | Status: DC | PRN

## 2021-08-04 NOTE — Progress Notes (Signed)
Pt called states she has mouth sores and is requesting some mouth wash for it. Discussed with MD, order for antibiotic and magic mouth wash received, sent to [facility] as requested by patient. Pt aware and declines any other questions or concerns at this time.  ?

## 2021-08-06 ENCOUNTER — Other Ambulatory Visit: Payer: Self-pay | Admitting: *Deleted

## 2021-08-06 MED ORDER — OXYCODONE-ACETAMINOPHEN 7.5-325 MG PO TABS: 1.0000 | ORAL_TABLET | Freq: Four times a day (QID) | ORAL | 0 refills | Status: DC | PRN

## 2021-08-07 ENCOUNTER — Other Ambulatory Visit: Payer: Self-pay | Admitting: Hematology & Oncology

## 2021-08-07 MED ORDER — OXYCODONE-ACETAMINOPHEN 7.5-325 MG PO TABS: 1.0000 | ORAL_TABLET | Freq: Four times a day (QID) | ORAL | 0 refills | Status: DC | PRN

## 2021-08-18 ENCOUNTER — Inpatient Hospital Stay: Payer: PPO

## 2021-08-18 ENCOUNTER — Inpatient Hospital Stay: Payer: PPO | Attending: Hematology & Oncology

## 2021-08-18 ENCOUNTER — Inpatient Hospital Stay: Payer: PPO | Admitting: Family

## 2021-08-18 ENCOUNTER — Encounter: Payer: Self-pay | Admitting: Family

## 2021-08-18 ENCOUNTER — Other Ambulatory Visit: Payer: Self-pay

## 2021-08-18 VITALS — BP 103/68 | HR 100 | Temp 98.0°F | Resp 17 | Wt 123.0 lb

## 2021-08-18 DIAGNOSIS — R5383 Other fatigue: Secondary | ICD-10-CM | POA: Insufficient documentation

## 2021-08-18 DIAGNOSIS — Z79899 Other long term (current) drug therapy: Secondary | ICD-10-CM | POA: Insufficient documentation

## 2021-08-18 DIAGNOSIS — K121 Other forms of stomatitis: Secondary | ICD-10-CM | POA: Diagnosis not present

## 2021-08-18 DIAGNOSIS — E032 Hypothyroidism due to medicaments and other exogenous substances: Secondary | ICD-10-CM

## 2021-08-18 DIAGNOSIS — C3412 Malignant neoplasm of upper lobe, left bronchus or lung: Secondary | ICD-10-CM

## 2021-08-18 DIAGNOSIS — Z95828 Presence of other vascular implants and grafts: Secondary | ICD-10-CM

## 2021-08-18 DIAGNOSIS — Z5112 Encounter for antineoplastic immunotherapy: Secondary | ICD-10-CM | POA: Diagnosis not present

## 2021-08-18 DIAGNOSIS — Z5111 Encounter for antineoplastic chemotherapy: Secondary | ICD-10-CM | POA: Diagnosis not present

## 2021-08-18 LAB — CMP (CANCER CENTER ONLY)
ALT: 9 U/L (ref 0–44)
AST: 12 U/L — ABNORMAL LOW (ref 15–41)
Albumin: 3.9 g/dL (ref 3.5–5.0)
Alkaline Phosphatase: 111 U/L (ref 38–126)
Anion gap: 8 (ref 5–15)
BUN: 15 mg/dL (ref 8–23)
CO2: 26 mmol/L (ref 22–32)
Calcium: 8.9 mg/dL (ref 8.9–10.3)
Chloride: 101 mmol/L (ref 98–111)
Creatinine: 0.68 mg/dL (ref 0.44–1.00)
GFR, Estimated: >60 mL/min (ref >60)
Glucose, Bld: 366 mg/dL — ABNORMAL HIGH (ref 70–99)
Potassium: 4 mmol/L (ref 3.5–5.1)
Sodium: 135 mmol/L (ref 135–145)
Total Bilirubin: 0.3 mg/dL (ref 0.3–1.2)
Total Protein: 6.5 g/dL (ref 6.5–8.1)

## 2021-08-18 LAB — CBC WITH DIFFERENTIAL (CANCER CENTER ONLY)
Abs Immature Granulocytes: 0.12 10*3/uL — ABNORMAL HIGH (ref 0.00–0.07)
Basophils Absolute: 0 10*3/uL (ref 0.0–0.1)
Basophils Relative: 0 %
Eosinophils Absolute: 0.1 10*3/uL (ref 0.0–0.5)
Eosinophils Relative: 1 %
HCT: 30.9 % — ABNORMAL LOW (ref 36.0–46.0)
Hemoglobin: 9.8 g/dL — ABNORMAL LOW (ref 12.0–15.0)
Immature Granulocytes: 1 %
Lymphocytes Relative: 9 %
Lymphs Abs: 1.1 10*3/uL (ref 0.7–4.0)
MCH: 32.7 pg (ref 26.0–34.0)
MCHC: 31.7 g/dL (ref 30.0–36.0)
MCV: 103 fL — ABNORMAL HIGH (ref 80.0–100.0)
Monocytes Absolute: 0.8 10*3/uL (ref 0.1–1.0)
Monocytes Relative: 6 %
Neutro Abs: 10.9 10*3/uL — ABNORMAL HIGH (ref 1.7–7.7)
Neutrophils Relative %: 83 %
Platelet Count: 245 10*3/uL (ref 150–400)
RBC: 3 MIL/uL — ABNORMAL LOW (ref 3.87–5.11)
RDW: 17.7 % — ABNORMAL HIGH (ref 11.5–15.5)
WBC Count: 13 10*3/uL — ABNORMAL HIGH (ref 4.0–10.5)
nRBC: 0 % (ref 0.0–0.2)

## 2021-08-18 LAB — TSH: TSH: 5.186 u[IU]/mL — ABNORMAL HIGH (ref 0.350–4.500)

## 2021-08-18 LAB — LACTATE DEHYDROGENASE: LDH: 248 U/L — ABNORMAL HIGH (ref 98–192)

## 2021-08-18 MED ORDER — SODIUM CHLORIDE 0.9% FLUSH
10.0000 mL | INTRAVENOUS | Status: AC | PRN
  Administered 2021-08-18: 10 mL via INTRAVENOUS

## 2021-08-18 MED ORDER — CHLORHEXIDINE GLUCONATE 0.12 % MT SOLN: 15.0000 mL | Freq: Two times a day (BID) | OROMUCOSAL | 1 refills | Status: DC

## 2021-08-18 MED ORDER — HEPARIN SOD (PORK) LOCK FLUSH 100 UNIT/ML IV SOLN
500.0000 [IU] | Freq: Once | INTRAVENOUS | Status: AC
  Administered 2021-08-18: 500 [IU] via INTRAVENOUS

## 2021-08-18 NOTE — Progress Notes (Signed)
?Hematology and Oncology Follow Up Visit ? ?[name] ?[id number] ?12-18-1950 71 y.o. ?08/18/2021 ? ? ?Principle Diagnosis:  ?Small cell lung cancer -- Extensive stage ?  ?Current Therapy:        ?Carbo/VP-16/Tecentriq -- s/p cycle 3 -- start on 06/11/2021 ?Xgeva 120 mg subcu every 3 months -- next dose on 09/2021 ?  ?Interim History:  [name] is here today for follow-up and treatment. She is doing fairly well but still has red raised bumps on the sides of her tongue that are painful. She also notes that her throat is a little sore.  ?She states that the magic mouth wash tastes nasty and she can't take it. We will switch her over to Peridex per Dr. Marin Olp.  ?No white patches or ulcers noted on exam.  ?No fever, chills, n/v, cough, rash, dizziness, chest pain, palpitations, abdominal pain or changes in bowel or bladder habits.  ?She does note fatigue and mild SOB with exertion. She takes a break to rest when needed.  ?No swelling, tenderness, numbness or tingling in her extremities at this time.  ?No falls or syncope to report.  ?Thankfully she still has a good appetite and is staying well hydrated. Her weight is up 3 lbs since her last visit to 123 lbs.  ? ?ECOG Performance Status: 1 - Symptomatic but completely ambulatory ? ?Medications:  ?Allergies as of 08/18/2021   ? ?   Reactions  ? Hydrocodone-acetaminophen Other (See Comments)  ? Nightmares, skin crawling  ? Lyrica [pregabalin] Other (See Comments)  ? Hallucinations   ? Crestor [rosuvastatin Calcium] Other (See Comments)  ? Leg cramps  ? ?  ? ?  ?Medication List  ?  ? ?  ? Accurate as of Aug 18, 2021  8:57 AM. If you have any questions, ask your nurse or doctor.  ?  ?  ? ?  ? ?STOP taking these medications   ? ?amoxicillin-clavulanate 875-125 MG tablet ?Commonly known as: AUGMENTIN ?Stopped by: Lottie Dawson, NP ?  ?hydrochlorothiazide 25 MG tablet ?Commonly known as: HYDRODIURIL ?Stopped by: Lottie Dawson, NP ?  ? ?  ? ?TAKE these medications    ? ?acetaminophen 500 MG tablet ?Commonly known as: TYLENOL ?Take 1,000 mg by mouth every 6 (six) hours as needed for moderate pain. ?  ?albuterol 108 (90 Base) MCG/ACT inhaler ?Commonly known as: VENTOLIN HFA ?Inhale 2 puffs into the lungs every 6 (six) hours as needed for wheezing or shortness of breath. ?  ?ALPRAZolam 0.5 MG tablet ?Commonly known as: Duanne Moron ?TAKE ONE TABLET BY MOUTH THREE TIMES A DAY AS NEEDED ?  ?blood glucose meter kit and supplies Kit ?Use daily ?  ?fentaNYL 12 MCG/HR ?Commonly known as: [place] ?Place 1 patch onto the skin every 3 (three) days. ?  ?FLUoxetine 20 MG capsule ?Commonly known as: PROZAC ?Take 1 capsule (20 mg total) by mouth daily. ?  ?Insulin Pen Needle 32G X 5 MM Misc ?Commonly known as: NovoTwist ?Use daily at Bedtime with Lantus ?  ?Lantus SoloStar 100 UNIT/ML Solostar Pen ?Generic drug: insulin glargine ?Inject 30 Units into the skin daily. HS ?  ?levothyroxine 100 MCG tablet ?Commonly known as: Synthroid ?Take 1 tablet (100 mcg total) by mouth daily before breakfast. ?  ?lidocaine-prilocaine cream ?Commonly known as: EMLA ?Apply to affected area once ?  ?magic mouthwash w/lidocaine Soln ?Take 5 mLs by mouth 4 (four) times daily as needed for mouth pain. ?  ?diphenhydrAMINE-lidocaine-alum & mag hydroxide-simeth-nystatin ?Swish and swallow 82mls by  mouth 4 times daily ?  ?meloxicam 15 MG tablet ?Commonly known as: MOBIC ?TAKE ONE TABLET BY MOUTH DAILY ?  ?metFORMIN 500 MG 24 hr tablet ?Commonly known as: GLUCOPHAGE-XR ?Take 1 tablet (500 mg total) by mouth 2 (two) times daily. ?  ?ondansetron 8 MG tablet ?Commonly known as: Zofran ?Take 1 tablet (8 mg total) by mouth 2 (two) times daily as needed for refractory nausea / vomiting. Start on day 3 after carboplatin chemo. ?  ?ONE TOUCH ULTRA 2 w/Device Kit ?Check blood sugar as directed. Dx:E11.65 ?  ?OneTouch Verio Flex System w/Device Kit ?USE AS DIRECTED TO CHECK BLOOD SUGAR FOUR TIMES A DAY ?  ?OneTouch Ultra test  strip ?Generic drug: glucose blood ?USE TO CHECK BLOOD SUGAR FOUR TIMES A DAY AS DIRECTED ?  ?oxyCODONE-acetaminophen 7.5-325 MG tablet ?Commonly known as: Percocet ?Take 1 tablet by mouth every 6 (six) hours as needed for severe pain. ?  ?prochlorperazine 10 MG tablet ?Commonly known as: COMPAZINE ?Take 1 tablet (10 mg total) by mouth every 6 (six) hours as needed (Nausea or vomiting). ?  ?Trulicity 3.15 VV/6.1YW Sopn ?Generic drug: Dulaglutide ?Inject 0.75 mg into the skin every Saturday. ?  ?UNABLE TO FIND ?Please provide hair prothesis for patient undergoing chemotherapy for cancer diagnosis. ?  ?valACYclovir 1000 MG tablet ?Commonly known as: VALTREX ?TAKE ONE TABLET BY MOUTH DAILY AS NEEDED ?  ? ?  ? ? ?Allergies:  ?Allergies  ?Allergen Reactions  ? Hydrocodone-Acetaminophen Other (See Comments)  ?  Nightmares, skin crawling  ? Lyrica [Pregabalin] Other (See Comments)  ?  Hallucinations   ? Crestor [Rosuvastatin Calcium] Other (See Comments)  ?  Leg cramps  ? ? ?Past Medical History, Surgical history, Social history, and Family History were reviewed and updated. ? ?Review of Systems: ?All other 10 point review of systems is negative.  ? ?Physical Exam: ? weight is 123 lb (55.8 kg). Her oral temperature is 98 ?F (36.7 ?C). Her blood pressure is 103/68 and her pulse is 100. Her respiration is 17 and oxygen saturation is 95%.  ? ?Wt Readings from Last 3 Encounters:  ?08/18/21 123 lb (55.8 kg)  ?07/28/21 120 lb 1.9 oz (54.5 kg)  ?07/07/21 120 lb (54.4 kg)  ? ? ?Ocular: Sclerae unicteric, pupils equal, round and reactive to light ?Ear-nose-throat: Oropharynx clear, dentition fair ?Lymphatic: No cervical or supraclavicular adenopathy ?Lungs no rales or rhonchi, good excursion bilaterally ?Heart regular rate and rhythm, no murmur appreciated ?Abd soft, nontender, positive bowel sounds ?MSK no focal spinal tenderness, no joint edema ?Neuro: non-focal, well-oriented, appropriate affect ?Breasts: Deferred ? ?Lab Results   ?Component Value Date  ? WBC 13.0 (H) 08/18/2021  ? HGB 9.8 (L) 08/18/2021  ? HCT 30.9 (L) 08/18/2021  ? MCV 103.0 (H) 08/18/2021  ? PLT 245 08/18/2021  ? ?No results found for: FERRITIN, IRON, TIBC, UIBC, IRONPCTSAT ?Lab Results  ?Component Value Date  ? RBC 3.00 (L) 08/18/2021  ? ?No results found for: KPAFRELGTCHN, LAMBDASER, KAPLAMBRATIO ?No results found for: IGGSERUM, IGA, IGMSERUM ?No results found for: TOTALPROTELP, ALBUMINELP, A1GS, A2GS, BETS, BETA2SER, GAMS, MSPIKE, SPEI ?  Chemistry   ?   ?Component Value Date/Time  ? NA 139 07/28/2021 0850  ? K 3.7 07/28/2021 0850  ? CL 101 07/28/2021 0850  ? CO2 29 07/28/2021 0850  ? BUN 15 07/28/2021 0850  ? CREATININE 0.73 07/28/2021 0850  ? CREATININE 0.88 05/08/2021 1126  ?    ?Component Value Date/Time  ? CALCIUM 9.4 07/28/2021 0850  ?  ALKPHOS 96 07/28/2021 0850  ? AST 13 (L) 07/28/2021 0850  ? ALT 12 07/28/2021 0850  ? BILITOT 0.3 07/28/2021 0850  ?  ? ? ? ?Impression and Plan: Ms. Kindig is a very pleasant 71 yo caucasian female with extensive stage small cell lung cancer.  ?I spoke with Dr. Marin Olp regarding her mouth sores and fatigue and we will hold treatment today. ?We will plan to resume in 1 week.  ?Follow-up 3 weeks after that.  ?Peridex order sent.  ?She will contact our office with any questions or concerns.  ? ?Lottie Dawson, NP ?5/15/20238:57 AM ? ?

## 2021-08-18 NOTE — Addendum Note (Signed)
Addended by: Melton Krebs on: 08/18/2021 09:46 AM ? ? Modules accepted: Orders ? ?

## 2021-08-19 ENCOUNTER — Inpatient Hospital Stay: Payer: PPO

## 2021-08-19 ENCOUNTER — Encounter: Payer: Self-pay | Admitting: *Deleted

## 2021-08-19 LAB — T4: T4, Total: 9.5 ug/dL (ref 4.5–12.0)

## 2021-08-19 NOTE — Progress Notes (Signed)
Patient's treatment will be held for one week due to fatigue and mucositis.  ? ?Oncology Nurse Navigator Documentation ? ? ?  08/19/2021  ?  8:30 AM  ?Oncology Nurse Navigator Flowsheets  ?Navigator Follow Up Date: 09/08/2021  ?Navigator Follow Up Reason: Follow-up Appointment;Chemotherapy  ?Navigator Location [facility]  ?Navigator Encounter Type Appt/Treatment Plan Review  ?Patient Visit Type MedOnc  ?Treatment Phase Active Tx  ?Barriers/Navigation Needs Coordination of Care;Education  ?Interventions None Required  ?Acuity Level 2-Minimal Needs (1-2 Barriers Identified)  ?Support Groups/Services Friends and Family  ?Time Spent with Patient 15  ?  ?

## 2021-08-20 ENCOUNTER — Inpatient Hospital Stay: Payer: PPO

## 2021-08-22 ENCOUNTER — Inpatient Hospital Stay: Payer: PPO

## 2021-08-25 ENCOUNTER — Inpatient Hospital Stay: Payer: PPO

## 2021-08-25 ENCOUNTER — Other Ambulatory Visit: Payer: Self-pay | Admitting: *Deleted

## 2021-08-25 VITALS — BP 109/71 | HR 95 | Temp 98.9°F | Resp 18

## 2021-08-25 DIAGNOSIS — Z5112 Encounter for antineoplastic immunotherapy: Secondary | ICD-10-CM | POA: Diagnosis not present

## 2021-08-25 DIAGNOSIS — C3412 Malignant neoplasm of upper lobe, left bronchus or lung: Secondary | ICD-10-CM

## 2021-08-25 DIAGNOSIS — Z95828 Presence of other vascular implants and grafts: Secondary | ICD-10-CM

## 2021-08-25 LAB — CBC WITH DIFFERENTIAL (CANCER CENTER ONLY)
Abs Immature Granulocytes: 0.04 10*3/uL (ref 0.00–0.07)
Basophils Absolute: 0.1 10*3/uL (ref 0.0–0.1)
Basophils Relative: 1 %
Eosinophils Absolute: 0 10*3/uL (ref 0.0–0.5)
Eosinophils Relative: 0 %
HCT: 32 % — ABNORMAL LOW (ref 36.0–46.0)
Hemoglobin: 10.4 g/dL — ABNORMAL LOW (ref 12.0–15.0)
Immature Granulocytes: 0 %
Lymphocytes Relative: 9 %
Lymphs Abs: 0.9 10*3/uL (ref 0.7–4.0)
MCH: 33.1 pg (ref 26.0–34.0)
MCHC: 32.5 g/dL (ref 30.0–36.0)
MCV: 101.9 fL — ABNORMAL HIGH (ref 80.0–100.0)
Monocytes Absolute: 1 10*3/uL (ref 0.1–1.0)
Monocytes Relative: 10 %
Neutro Abs: 8 10*3/uL — ABNORMAL HIGH (ref 1.7–7.7)
Neutrophils Relative %: 80 %
Platelet Count: 264 10*3/uL (ref 150–400)
RBC: 3.14 MIL/uL — ABNORMAL LOW (ref 3.87–5.11)
RDW: 16.5 % — ABNORMAL HIGH (ref 11.5–15.5)
WBC Count: 10 10*3/uL (ref 4.0–10.5)
nRBC: 0 % (ref 0.0–0.2)

## 2021-08-25 LAB — CMP (CANCER CENTER ONLY)
ALT: 8 U/L (ref 0–44)
AST: 12 U/L — ABNORMAL LOW (ref 15–41)
Albumin: 4 g/dL (ref 3.5–5.0)
Alkaline Phosphatase: 84 U/L (ref 38–126)
Anion gap: 8 (ref 5–15)
BUN: 17 mg/dL (ref 8–23)
CO2: 26 mmol/L (ref 22–32)
Calcium: 8.8 mg/dL — ABNORMAL LOW (ref 8.9–10.3)
Chloride: 104 mmol/L (ref 98–111)
Creatinine: 0.66 mg/dL (ref 0.44–1.00)
GFR, Estimated: >60 mL/min (ref >60)
Glucose, Bld: 145 mg/dL — ABNORMAL HIGH (ref 70–99)
Potassium: 3.9 mmol/L (ref 3.5–5.1)
Sodium: 138 mmol/L (ref 135–145)
Total Bilirubin: 0.3 mg/dL (ref 0.3–1.2)
Total Protein: 7.1 g/dL (ref 6.5–8.1)

## 2021-08-25 MED ORDER — SODIUM CHLORIDE 0.9 % IV SOLN
10.0000 mg | Freq: Once | INTRAVENOUS | Status: AC
  Administered 2021-08-25: 10 mg via INTRAVENOUS
  Filled 2021-08-25: qty 10

## 2021-08-25 MED ORDER — SODIUM CHLORIDE 0.9% FLUSH
10.0000 mL | Freq: Once | INTRAVENOUS | Status: AC
  Administered 2021-08-25: 10 mL via INTRAVENOUS

## 2021-08-25 MED ORDER — OXYCODONE-ACETAMINOPHEN 7.5-325 MG PO TABS: 1.0000 | ORAL_TABLET | Freq: Four times a day (QID) | ORAL | 0 refills | Status: DC | PRN

## 2021-08-25 MED ORDER — SODIUM CHLORIDE 0.9 % IV SOLN: Freq: Once | INTRAVENOUS | Status: AC

## 2021-08-25 MED ORDER — HOT PACK MISC ONCOLOGY: 1.0000 | Freq: Once | Status: DC | PRN

## 2021-08-25 MED ORDER — SODIUM CHLORIDE 0.9 % IV SOLN
80.0000 mg/m2 | Freq: Once | INTRAVENOUS | Status: AC
  Administered 2021-08-25: 120 mg via INTRAVENOUS
  Filled 2021-08-25: qty 6

## 2021-08-25 MED ORDER — HEPARIN SOD (PORK) LOCK FLUSH 100 UNIT/ML IV SOLN
500.0000 [IU] | Freq: Once | INTRAVENOUS | Status: AC | PRN
  Administered 2021-08-25: 500 [IU]

## 2021-08-25 MED ORDER — SODIUM CHLORIDE 0.9 % IV SOLN
1200.0000 mg | Freq: Once | INTRAVENOUS | Status: AC
  Administered 2021-08-25: 1200 mg via INTRAVENOUS
  Filled 2021-08-25: qty 20

## 2021-08-25 MED ORDER — SODIUM CHLORIDE 0.9 % IV SOLN
150.0000 mg | Freq: Once | INTRAVENOUS | Status: AC
  Administered 2021-08-25: 150 mg via INTRAVENOUS
  Filled 2021-08-25: qty 150

## 2021-08-25 MED ORDER — SODIUM CHLORIDE 0.9% FLUSH
10.0000 mL | INTRAVENOUS | Status: DC | PRN
  Administered 2021-08-25: 10 mL

## 2021-08-25 MED ORDER — SODIUM CHLORIDE 0.9 % IV SOLN
346.5000 mg | Freq: Once | INTRAVENOUS | Status: AC
  Administered 2021-08-25: 350 mg via INTRAVENOUS
  Filled 2021-08-25: qty 35

## 2021-08-25 MED ORDER — SODIUM CHLORIDE 0.9% FLUSH: 3.0000 mL | INTRAVENOUS | Status: DC | PRN

## 2021-08-25 MED ORDER — PALONOSETRON HCL INJECTION 0.25 MG/5ML
0.2500 mg | Freq: Once | INTRAVENOUS | Status: AC
  Administered 2021-08-25: 0.25 mg via INTRAVENOUS
  Filled 2021-08-25: qty 5

## 2021-08-25 MED ORDER — HEPARIN SOD (PORK) LOCK FLUSH 100 UNIT/ML IV SOLN: 500.0000 [IU] | Freq: Once | INTRAVENOUS | Status: DC

## 2021-08-25 NOTE — Patient Instructions (Signed)
[facility]  Discharge Instructions: Thank you for choosing [facility] to provide your oncology and hematology care.   If you have a lab appointment with the [facility], please go directly to the [facility] and check in at the registration area.  Wear comfortable clothing and clothing appropriate for easy access to any Portacath or PICC line.   We strive to give you quality time with your provider. You may need to reschedule your appointment if you arrive late (15 or more minutes).  Arriving late affects you and other patients whose appointments are after yours.  Also, if you miss three or more appointments without notifying the office, you may be dismissed from the clinic at the provider's discretion.      For prescription refill requests, have your pharmacy contact our office and allow 72 hours for refills to be completed.    Today you received the following chemotherapy and/or immunotherapy agents Tecentriq, Paraplatin, Vepesid.      To help prevent nausea and vomiting after your treatment, we encourage you to take your nausea medication as directed.  BELOW ARE SYMPTOMS THAT SHOULD BE REPORTED IMMEDIATELY: *FEVER GREATER THAN 100.4 F (38 C) OR HIGHER *CHILLS OR SWEATING *NAUSEA AND VOMITING THAT IS NOT CONTROLLED WITH YOUR NAUSEA MEDICATION *UNUSUAL SHORTNESS OF BREATH *UNUSUAL BRUISING OR BLEEDING *URINARY PROBLEMS (pain or burning when urinating, or frequent urination) *BOWEL PROBLEMS (unusual diarrhea, constipation, pain near the anus) TENDERNESS IN MOUTH AND THROAT WITH OR WITHOUT PRESENCE OF ULCERS (sore throat, sores in mouth, or a toothache) UNUSUAL RASH, SWELLING OR PAIN  UNUSUAL VAGINAL DISCHARGE OR ITCHING   Items with * indicate a potential emergency and should be followed up as soon as possible or go to the Emergency Department if any problems should occur.  Please show the CHEMOTHERAPY ALERT CARD or IMMUNOTHERAPY ALERT  CARD at check-in to the Emergency Department and triage nurse. Should you have questions after your visit or need to cancel or reschedule your appointment, please contact [facility]  [phone] and follow the prompts.  Office hours are 8:00 a.m. to 4:30 p.m. Monday - Friday. Please note that voicemails left after 4:00 p.m. may not be returned until the following business day.  We are closed weekends and major holidays. You have access to a nurse at all times for urgent questions. Please call the main number to the clinic [phone] and follow the prompts.  For any non-urgent questions, you may also contact your provider using MyChart. We now offer e-Visits for anyone 59 and older to request care online for non-urgent symptoms. For details visit [email or web address].   Also download the MyChart app! Go to the app store, search "MyChart", open the app, select [facility], and log in with your MyChart username and password.  Due to Covid, a mask is required upon entering the hospital/clinic. If you do not have a mask, one will be given to you upon arrival. For doctor visits, patients may have 1 support person aged 8 or older with them. For treatment visits, patients cannot have anyone with them due to current Covid guidelines and our immunocompromised population.

## 2021-08-25 NOTE — Progress Notes (Signed)
OK to treat with ANC value from today per Dr. Marin Olp.

## 2021-08-25 NOTE — Addendum Note (Signed)
Addended by: Burney Gauze R on: 08/25/2021 10:25 AM   Modules accepted: Orders

## 2021-08-26 ENCOUNTER — Inpatient Hospital Stay: Payer: PPO

## 2021-08-26 VITALS — BP 112/74 | HR 94 | Temp 97.5°F | Resp 16

## 2021-08-26 DIAGNOSIS — Z5112 Encounter for antineoplastic immunotherapy: Secondary | ICD-10-CM | POA: Diagnosis not present

## 2021-08-26 DIAGNOSIS — C3412 Malignant neoplasm of upper lobe, left bronchus or lung: Secondary | ICD-10-CM

## 2021-08-26 MED ORDER — SODIUM CHLORIDE 0.9% FLUSH
10.0000 mL | INTRAVENOUS | Status: DC | PRN
  Administered 2021-08-26: 10 mL

## 2021-08-26 MED ORDER — HEPARIN SOD (PORK) LOCK FLUSH 100 UNIT/ML IV SOLN
500.0000 [IU] | Freq: Once | INTRAVENOUS | Status: AC | PRN
  Administered 2021-08-26: 500 [IU]

## 2021-08-26 MED ORDER — SODIUM CHLORIDE 0.9 % IV SOLN: Freq: Once | INTRAVENOUS | Status: AC

## 2021-08-26 MED ORDER — SODIUM CHLORIDE 0.9 % IV SOLN
10.0000 mg | Freq: Once | INTRAVENOUS | Status: AC
  Administered 2021-08-26: 10 mg via INTRAVENOUS
  Filled 2021-08-26: qty 10

## 2021-08-26 MED ORDER — SODIUM CHLORIDE 0.9 % IV SOLN
80.0000 mg/m2 | Freq: Once | INTRAVENOUS | Status: AC
  Administered 2021-08-26: 120 mg via INTRAVENOUS
  Filled 2021-08-26: qty 6

## 2021-08-26 NOTE — Patient Instructions (Signed)
[facility]  Discharge Instructions: Thank you for choosing [facility] to provide your oncology and hematology care.   If you have a lab appointment with the [facility], please go directly to the [facility] and check in at the registration area.  Wear comfortable clothing and clothing appropriate for easy access to any Portacath or PICC line.   We strive to give you quality time with your provider. You may need to reschedule your appointment if you arrive late (15 or more minutes).  Arriving late affects you and other patients whose appointments are after yours.  Also, if you miss three or more appointments without notifying the office, you may be dismissed from the clinic at the provider's discretion.      For prescription refill requests, have your pharmacy contact our office and allow 72 hours for refills to be completed.    Today you received the following chemotherapy and/or immunotherapy agents VP-16       To help prevent nausea and vomiting after your treatment, we encourage you to take your nausea medication as directed.  BELOW ARE SYMPTOMS THAT SHOULD BE REPORTED IMMEDIATELY: *FEVER GREATER THAN 100.4 F (38 C) OR HIGHER *CHILLS OR SWEATING *NAUSEA AND VOMITING THAT IS NOT CONTROLLED WITH YOUR NAUSEA MEDICATION *UNUSUAL SHORTNESS OF BREATH *UNUSUAL BRUISING OR BLEEDING *URINARY PROBLEMS (pain or burning when urinating, or frequent urination) *BOWEL PROBLEMS (unusual diarrhea, constipation, pain near the anus) TENDERNESS IN MOUTH AND THROAT WITH OR WITHOUT PRESENCE OF ULCERS (sore throat, sores in mouth, or a toothache) UNUSUAL RASH, SWELLING OR PAIN  UNUSUAL VAGINAL DISCHARGE OR ITCHING   Items with * indicate a potential emergency and should be followed up as soon as possible or go to the Emergency Department if any problems should occur.  Please show the CHEMOTHERAPY ALERT CARD or IMMUNOTHERAPY ALERT CARD at check-in to the  Emergency Department and triage nurse. Should you have questions after your visit or need to cancel or reschedule your appointment, please contact [facility]  [phone] and follow the prompts.  Office hours are 8:00 a.m. to 4:30 p.m. Monday - Friday. Please note that voicemails left after 4:00 p.m. may not be returned until the following business day.  We are closed weekends and major holidays. You have access to a nurse at all times for urgent questions. Please call the main number to the clinic [phone] and follow the prompts.  For any non-urgent questions, you may also contact your provider using MyChart. We now offer e-Visits for anyone 71 and older to request care online for non-urgent symptoms. For details visit [email or web address].   Also download the MyChart app! Go to the app store, search "MyChart", open the app, select [facility], and log in with your MyChart username and password.  Due to Covid, a mask is required upon entering the hospital/clinic. If you do not have a mask, one will be given to you upon arrival. For doctor visits, patients may have 1 support person aged 71 or older with them. For treatment visits, patients cannot have anyone with them due to current Covid guidelines and our immunocompromised population.

## 2021-08-27 ENCOUNTER — Inpatient Hospital Stay: Payer: PPO

## 2021-08-27 VITALS — BP 117/73 | HR 94 | Temp 97.9°F | Resp 18

## 2021-08-27 DIAGNOSIS — C3412 Malignant neoplasm of upper lobe, left bronchus or lung: Secondary | ICD-10-CM

## 2021-08-27 DIAGNOSIS — Z5112 Encounter for antineoplastic immunotherapy: Secondary | ICD-10-CM | POA: Diagnosis not present

## 2021-08-27 MED ORDER — SODIUM CHLORIDE 0.9% FLUSH
10.0000 mL | INTRAVENOUS | Status: DC | PRN
  Administered 2021-08-27: 10 mL

## 2021-08-27 MED ORDER — SODIUM CHLORIDE 0.9 % IV SOLN
10.0000 mg | Freq: Once | INTRAVENOUS | Status: AC
  Administered 2021-08-27: 10 mg via INTRAVENOUS
  Filled 2021-08-27: qty 10

## 2021-08-27 MED ORDER — SODIUM CHLORIDE 0.9 % IV SOLN: Freq: Once | INTRAVENOUS | Status: AC

## 2021-08-27 MED ORDER — SODIUM CHLORIDE 0.9 % IV SOLN
80.0000 mg/m2 | Freq: Once | INTRAVENOUS | Status: AC
  Administered 2021-08-27: 120 mg via INTRAVENOUS
  Filled 2021-08-27: qty 6

## 2021-08-27 MED ORDER — HEPARIN SOD (PORK) LOCK FLUSH 100 UNIT/ML IV SOLN
500.0000 [IU] | Freq: Once | INTRAVENOUS | Status: AC | PRN
  Administered 2021-08-27: 500 [IU]

## 2021-08-27 NOTE — Patient Instructions (Signed)
[facility]  Discharge Instructions: Thank you for choosing [facility] to provide your oncology and hematology care.   If you have a lab appointment with the [facility], please go directly to the [facility] and check in at the registration area.  Wear comfortable clothing and clothing appropriate for easy access to any Portacath or PICC line.   We strive to give you quality time with your provider. You may need to reschedule your appointment if you arrive late (15 or more minutes).  Arriving late affects you and other patients whose appointments are after yours.  Also, if you miss three or more appointments without notifying the office, you may be dismissed from the clinic at the provider's discretion.      For prescription refill requests, have your pharmacy contact our office and allow 72 hours for refills to be completed.    Today you received the following chemotherapy and/or immunotherapy agents VP-16      To help prevent nausea and vomiting after your treatment, we encourage you to take your nausea medication as directed.  BELOW ARE SYMPTOMS THAT SHOULD BE REPORTED IMMEDIATELY: *FEVER GREATER THAN 100.4 F (38 C) OR HIGHER *CHILLS OR SWEATING *NAUSEA AND VOMITING THAT IS NOT CONTROLLED WITH YOUR NAUSEA MEDICATION *UNUSUAL SHORTNESS OF BREATH *UNUSUAL BRUISING OR BLEEDING *URINARY PROBLEMS (pain or burning when urinating, or frequent urination) *BOWEL PROBLEMS (unusual diarrhea, constipation, pain near the anus) TENDERNESS IN MOUTH AND THROAT WITH OR WITHOUT PRESENCE OF ULCERS (sore throat, sores in mouth, or a toothache) UNUSUAL RASH, SWELLING OR PAIN  UNUSUAL VAGINAL DISCHARGE OR ITCHING   Items with * indicate a potential emergency and should be followed up as soon as possible or go to the Emergency Department if any problems should occur.  Please show the CHEMOTHERAPY ALERT CARD or IMMUNOTHERAPY ALERT CARD at check-in to the  Emergency Department and triage nurse. Should you have questions after your visit or need to cancel or reschedule your appointment, please contact [facility]  [phone] and follow the prompts.  Office hours are 8:00 a.m. to 4:30 p.m. Monday - Friday. Please note that voicemails left after 4:00 p.m. may not be returned until the following business day.  We are closed weekends and major holidays. You have access to a nurse at all times for urgent questions. Please call the main number to the clinic [phone] and follow the prompts.  For any non-urgent questions, you may also contact your provider using MyChart. We now offer e-Visits for anyone 58 and older to request care online for non-urgent symptoms. For details visit [email or web address].   Also download the MyChart app! Go to the app store, search "MyChart", open the app, select [facility], and log in with your MyChart username and password.  Due to Covid, a mask is required upon entering the hospital/clinic. If you do not have a mask, one will be given to you upon arrival. For doctor visits, patients may have 1 support person aged 27 or older with them. For treatment visits, patients cannot have anyone with them due to current Covid guidelines and our immunocompromised population.

## 2021-08-29 ENCOUNTER — Inpatient Hospital Stay: Payer: PPO

## 2021-08-29 VITALS — BP 100/67 | HR 89 | Temp 98.1°F

## 2021-08-29 DIAGNOSIS — C3412 Malignant neoplasm of upper lobe, left bronchus or lung: Secondary | ICD-10-CM

## 2021-08-29 DIAGNOSIS — Z5112 Encounter for antineoplastic immunotherapy: Secondary | ICD-10-CM | POA: Diagnosis not present

## 2021-08-29 MED ORDER — PEGFILGRASTIM-CBQV 6 MG/0.6ML ~~LOC~~ SOSY
6.0000 mg | PREFILLED_SYRINGE | Freq: Once | SUBCUTANEOUS | Status: AC
  Administered 2021-08-29: 6 mg via SUBCUTANEOUS
  Filled 2021-08-29: qty 0.6

## 2021-08-29 NOTE — Patient Instructions (Signed)
[facility]  Discharge Instructions: Thank you for choosing [facility] to provide your oncology and hematology care.   If you have a lab appointment with the [facility], please go directly to the [facility] and check in at the registration area.  Wear comfortable clothing and clothing appropriate for easy access to any Portacath or PICC line.   We strive to give you quality time with your provider. You may need to reschedule your appointment if you arrive late (15 or more minutes).  Arriving late affects you and other patients whose appointments are after yours.  Also, if you miss three or more appointments without notifying the office, you may be dismissed from the clinic at the provider's discretion.      For prescription refill requests, have your pharmacy contact our office and allow 72 hours for refills to be completed.    Today you received the following chemotherapy and/or immunotherapy agents udenyca    To help prevent nausea and vomiting after your treatment, we encourage you to take your nausea medication as directed.  BELOW ARE SYMPTOMS THAT SHOULD BE REPORTED IMMEDIATELY: *FEVER GREATER THAN 100.4 F (38 C) OR HIGHER *CHILLS OR SWEATING *NAUSEA AND VOMITING THAT IS NOT CONTROLLED WITH YOUR NAUSEA MEDICATION *UNUSUAL SHORTNESS OF BREATH *UNUSUAL BRUISING OR BLEEDING *URINARY PROBLEMS (pain or burning when urinating, or frequent urination) *BOWEL PROBLEMS (unusual diarrhea, constipation, pain near the anus) TENDERNESS IN MOUTH AND THROAT WITH OR WITHOUT PRESENCE OF ULCERS (sore throat, sores in mouth, or a toothache) UNUSUAL RASH, SWELLING OR PAIN  UNUSUAL VAGINAL DISCHARGE OR ITCHING   Items with * indicate a potential emergency and should be followed up as soon as possible or go to the Emergency Department if any problems should occur.  Please show the CHEMOTHERAPY ALERT CARD or IMMUNOTHERAPY ALERT CARD at check-in to the  Emergency Department and triage nurse. Should you have questions after your visit or need to cancel or reschedule your appointment, please contact [facility]  [phone] and follow the prompts.  Office hours are 8:00 a.m. to 4:30 p.m. Monday - Friday. Please note that voicemails left after 4:00 p.m. may not be returned until the following business day.  We are closed weekends and major holidays. You have access to a nurse at all times for urgent questions. Please call the main number to the clinic [phone] and follow the prompts.  For any non-urgent questions, you may also contact your provider using MyChart. We now offer e-Visits for anyone 84 and older to request care online for non-urgent symptoms. For details visit [email or web address].   Also download the MyChart app! Go to the app store, search "MyChart", open the app, select [facility], and log in with your MyChart username and password.  Due to Covid, a mask is required upon entering the hospital/clinic. If you do not have a mask, one will be given to you upon arrival. For doctor visits, patients may have 1 support person aged 106 or older with them. For treatment visits, patients cannot have anyone with them due to current Covid guidelines and our immunocompromised population.

## 2021-08-30 ENCOUNTER — Encounter: Payer: Self-pay | Admitting: Family Medicine

## 2021-09-02 ENCOUNTER — Telehealth: Payer: Self-pay | Admitting: *Deleted

## 2021-09-02 ENCOUNTER — Other Ambulatory Visit: Payer: Self-pay | Admitting: Family Medicine

## 2021-09-02 DIAGNOSIS — E1165 Type 2 diabetes mellitus with hyperglycemia: Secondary | ICD-10-CM

## 2021-09-02 MED ORDER — OMEPRAZOLE 20 MG PO CPDR: 20.0000 mg | DELAYED_RELEASE_CAPSULE | Freq: Every day | ORAL | 3 refills | Status: DC

## 2021-09-02 NOTE — Telephone Encounter (Signed)
Returned patient's phone call and she stated,"I have a catch in my hip. It started Friday night when I got up to go to the bathroom. It did the same thing Saturday morning. It feels like arthritic pain. I haven't been taking my arthritis medicine either (Meloxicam). All this rain makes me ache. I told her to take her Meloxicam as ordered and see if the pain decreases. Please call the office if she has any other concerns or questions. She verbalized understanding.

## 2021-09-08 ENCOUNTER — Encounter: Payer: Self-pay | Admitting: *Deleted

## 2021-09-08 ENCOUNTER — Inpatient Hospital Stay: Payer: PPO

## 2021-09-08 ENCOUNTER — Inpatient Hospital Stay: Payer: PPO | Admitting: Hematology & Oncology

## 2021-09-08 NOTE — Progress Notes (Signed)
Patient treatment on 6/12 due to delay of previous cycle.   Oncology Nurse Navigator Documentation     09/08/2021   12:45 PM  Oncology Nurse Navigator Flowsheets  Navigator Follow Up Date: 09/15/2021  Navigator Follow Up Reason: Follow-up Appointment;Chemotherapy  Navigator Location [facility]  Navigator Encounter Type Appt/Treatment Plan Review  Patient Visit Type MedOnc  Treatment Phase Active Tx  Barriers/Navigation Needs Coordination of Care;Education  Interventions None Required  Acuity Level 2-Minimal Needs (1-2 Barriers Identified)  Support Groups/Services Friends and Family  Time Spent with Patient 15

## 2021-09-09 ENCOUNTER — Inpatient Hospital Stay: Payer: PPO

## 2021-09-10 ENCOUNTER — Inpatient Hospital Stay: Payer: PPO

## 2021-09-12 ENCOUNTER — Inpatient Hospital Stay: Payer: PPO

## 2021-09-15 ENCOUNTER — Other Ambulatory Visit: Payer: Self-pay | Admitting: Hematology & Oncology

## 2021-09-15 ENCOUNTER — Inpatient Hospital Stay (HOSPITAL_BASED_OUTPATIENT_CLINIC_OR_DEPARTMENT_OTHER): Payer: PPO | Admitting: Family

## 2021-09-15 ENCOUNTER — Inpatient Hospital Stay: Payer: PPO

## 2021-09-15 ENCOUNTER — Other Ambulatory Visit: Payer: Self-pay | Admitting: Oncology

## 2021-09-15 ENCOUNTER — Encounter: Payer: Self-pay | Admitting: Family

## 2021-09-15 ENCOUNTER — Inpatient Hospital Stay: Payer: PPO | Attending: Hematology & Oncology

## 2021-09-15 ENCOUNTER — Other Ambulatory Visit: Payer: Self-pay

## 2021-09-15 VITALS — BP 114/75 | HR 91 | Temp 98.1°F | Resp 20 | Ht 59.0 in | Wt 120.8 lb

## 2021-09-15 DIAGNOSIS — R5383 Other fatigue: Secondary | ICD-10-CM | POA: Insufficient documentation

## 2021-09-15 DIAGNOSIS — R0602 Shortness of breath: Secondary | ICD-10-CM | POA: Insufficient documentation

## 2021-09-15 DIAGNOSIS — Z885 Allergy status to narcotic agent status: Secondary | ICD-10-CM | POA: Diagnosis not present

## 2021-09-15 DIAGNOSIS — K121 Other forms of stomatitis: Secondary | ICD-10-CM

## 2021-09-15 DIAGNOSIS — Z5189 Encounter for other specified aftercare: Secondary | ICD-10-CM | POA: Diagnosis not present

## 2021-09-15 DIAGNOSIS — C3412 Malignant neoplasm of upper lobe, left bronchus or lung: Secondary | ICD-10-CM | POA: Insufficient documentation

## 2021-09-15 DIAGNOSIS — Z79899 Other long term (current) drug therapy: Secondary | ICD-10-CM | POA: Diagnosis not present

## 2021-09-15 DIAGNOSIS — E032 Hypothyroidism due to medicaments and other exogenous substances: Secondary | ICD-10-CM

## 2021-09-15 DIAGNOSIS — Z5112 Encounter for antineoplastic immunotherapy: Secondary | ICD-10-CM | POA: Insufficient documentation

## 2021-09-15 DIAGNOSIS — Z5111 Encounter for antineoplastic chemotherapy: Secondary | ICD-10-CM | POA: Insufficient documentation

## 2021-09-15 DIAGNOSIS — C778 Secondary and unspecified malignant neoplasm of lymph nodes of multiple regions: Secondary | ICD-10-CM

## 2021-09-15 LAB — CMP (CANCER CENTER ONLY)
ALT: 10 U/L (ref 0–44)
AST: 13 U/L — ABNORMAL LOW (ref 15–41)
Albumin: 4.1 g/dL (ref 3.5–5.0)
Alkaline Phosphatase: 90 U/L (ref 38–126)
Anion gap: 8 (ref 5–15)
BUN: 13 mg/dL (ref 8–23)
CO2: 27 mmol/L (ref 22–32)
Calcium: 9.2 mg/dL (ref 8.9–10.3)
Chloride: 102 mmol/L (ref 98–111)
Creatinine: 0.7 mg/dL (ref 0.44–1.00)
GFR, Estimated: >60 mL/min (ref >60)
Glucose, Bld: 211 mg/dL — ABNORMAL HIGH (ref 70–99)
Potassium: 4.2 mmol/L (ref 3.5–5.1)
Sodium: 137 mmol/L (ref 135–145)
Total Bilirubin: 0.3 mg/dL (ref 0.3–1.2)
Total Protein: 6.9 g/dL (ref 6.5–8.1)

## 2021-09-15 LAB — CBC WITH DIFFERENTIAL (CANCER CENTER ONLY)
Abs Immature Granulocytes: 0.05 10*3/uL (ref 0.00–0.07)
Basophils Absolute: 0 10*3/uL (ref 0.0–0.1)
Basophils Relative: 0 %
Eosinophils Absolute: 0.1 10*3/uL (ref 0.0–0.5)
Eosinophils Relative: 1 %
HCT: 33.3 % — ABNORMAL LOW (ref 36.0–46.0)
Hemoglobin: 10.7 g/dL — ABNORMAL LOW (ref 12.0–15.0)
Immature Granulocytes: 1 %
Lymphocytes Relative: 10 %
Lymphs Abs: 0.9 10*3/uL (ref 0.7–4.0)
MCH: 32.7 pg (ref 26.0–34.0)
MCHC: 32.1 g/dL (ref 30.0–36.0)
MCV: 101.8 fL — ABNORMAL HIGH (ref 80.0–100.0)
Monocytes Absolute: 0.7 10*3/uL (ref 0.1–1.0)
Monocytes Relative: 8 %
Neutro Abs: 7.4 10*3/uL (ref 1.7–7.7)
Neutrophils Relative %: 80 %
Platelet Count: 264 10*3/uL (ref 150–400)
RBC: 3.27 MIL/uL — ABNORMAL LOW (ref 3.87–5.11)
RDW: 14.7 % (ref 11.5–15.5)
WBC Count: 9.2 10*3/uL (ref 4.0–10.5)
nRBC: 0 % (ref 0.0–0.2)

## 2021-09-15 LAB — TSH: TSH: 2.321 u[IU]/mL (ref 0.350–4.500)

## 2021-09-15 LAB — LACTATE DEHYDROGENASE: LDH: 195 U/L — ABNORMAL HIGH (ref 98–192)

## 2021-09-15 MED ORDER — SODIUM CHLORIDE 0.9 % IV SOLN
150.0000 mg | Freq: Once | INTRAVENOUS | Status: AC
  Administered 2021-09-15: 150 mg via INTRAVENOUS
  Filled 2021-09-15: qty 150

## 2021-09-15 MED ORDER — SODIUM CHLORIDE 0.9% FLUSH
10.0000 mL | INTRAVENOUS | Status: DC | PRN
  Administered 2021-09-15: 10 mL

## 2021-09-15 MED ORDER — SODIUM CHLORIDE 0.9 % IV SOLN
1200.0000 mg | Freq: Once | INTRAVENOUS | Status: AC
  Administered 2021-09-15: 1200 mg via INTRAVENOUS
  Filled 2021-09-15: qty 20

## 2021-09-15 MED ORDER — HEPARIN SOD (PORK) LOCK FLUSH 100 UNIT/ML IV SOLN
500.0000 [IU] | Freq: Once | INTRAVENOUS | Status: AC | PRN
  Administered 2021-09-15: 500 [IU]

## 2021-09-15 MED ORDER — SODIUM CHLORIDE 0.9 % IV SOLN
10.0000 mg | Freq: Once | INTRAVENOUS | Status: AC
  Administered 2021-09-15: 10 mg via INTRAVENOUS
  Filled 2021-09-15: qty 10

## 2021-09-15 MED ORDER — SODIUM CHLORIDE 0.9 % IV SOLN: Freq: Once | INTRAVENOUS | Status: AC

## 2021-09-15 MED ORDER — SODIUM CHLORIDE 0.9 % IV SOLN
346.5000 mg | Freq: Once | INTRAVENOUS | Status: AC
  Administered 2021-09-15: 350 mg via INTRAVENOUS
  Filled 2021-09-15: qty 35

## 2021-09-15 MED ORDER — SODIUM CHLORIDE 0.9 % IV SOLN
80.0000 mg/m2 | Freq: Once | INTRAVENOUS | Status: AC
  Administered 2021-09-15: 120 mg via INTRAVENOUS
  Filled 2021-09-15: qty 6

## 2021-09-15 MED ORDER — PALONOSETRON HCL INJECTION 0.25 MG/5ML
0.2500 mg | Freq: Once | INTRAVENOUS | Status: AC
  Administered 2021-09-15: 0.25 mg via INTRAVENOUS
  Filled 2021-09-15: qty 5

## 2021-09-15 MED ORDER — DENOSUMAB 120 MG/1.7ML ~~LOC~~ SOLN
120.0000 mg | Freq: Once | SUBCUTANEOUS | Status: AC
  Administered 2021-09-15: 120 mg via SUBCUTANEOUS
  Filled 2021-09-15: qty 1.7

## 2021-09-15 NOTE — Progress Notes (Signed)
Hematology and Oncology Follow Up Visit  [name] [id number] [birth year] 72 y.o. 09/15/2021   Principle Diagnosis:  Small cell lung cancer -- Extensive stage   Current Therapy:        Carbo/VP-16/Tecentriq -- s/p cycle 3 -- start on 06/11/2021 Xgeva 120 mg subcu every 3 months -- next dose on 12/2021   Interim History:  [name] is here today for follow-up and treatment cycle 5. She is doing well but does note occasional pain in the left groin. She states that all the recent rain has made this a little worse but she is responsive to Meloxicam from Dr. Etter Sjogren. Mild SOB and fatigue with over exertion. She will take a break to rest when needed.  No fever, chills, n/v, cough, rash, dizziness, chest pain, palpitations, abdominal pain or changes in bowel or bladder habits.    No bleeding, bruising or petechiae.  No swelling, tenderness, numbness or tingling in her extremities at this time.  No falls or syncope to report.  She states that her appetite is ok and she is doing her best to stay well hydrated throughout the day. Her weight is 120 lbs.   ECOG Performance Status: 1 - Symptomatic but completely ambulatory  Medications:  Allergies as of 09/15/2021       Reactions   Hydrocodone-acetaminophen Other (See Comments)   Nightmares, skin crawling   Lyrica [pregabalin] Other (See Comments)   Hallucinations    Crestor [rosuvastatin Calcium] Other (See Comments)   Leg cramps        Medication List        Accurate as of September 15, 2021 10:16 AM. If you have any questions, ask your nurse or doctor.          acetaminophen 500 MG tablet Commonly known as: TYLENOL Take 1,000 mg by mouth every 6 (six) hours as needed for moderate pain.   albuterol 108 (90 Base) MCG/ACT inhaler Commonly known as: VENTOLIN HFA Inhale 2 puffs into the lungs every 6 (six) hours as needed for wheezing or shortness of breath.   ALPRAZolam 0.5 MG tablet Commonly known as: XANAX TAKE ONE TABLET BY  MOUTH THREE TIMES A DAY AS NEEDED   blood glucose meter kit and supplies Kit Use daily   chlorhexidine 0.12 % solution Commonly known as: PERIDEX Use as directed 15 mLs in the mouth or throat 2 (two) times daily.   fentaNYL 12 MCG/HR Commonly known as: [place] 1 patch onto the skin every 3 (three) days.   FLUoxetine 20 MG capsule Commonly known as: PROZAC Take 1 capsule (20 mg total) by mouth daily.   Insulin Pen Needle 32G X 5 MM Misc Commonly known as: NovoTwist Use daily at Bedtime with Lantus   Lantus SoloStar 100 UNIT/ML Solostar Pen Generic drug: insulin glargine Inject 30 Units into the skin daily. HS   levothyroxine 100 MCG tablet Commonly known as: Synthroid Take 1 tablet (100 mcg total) by mouth daily before breakfast.   lidocaine-prilocaine cream Commonly known as: EMLA Apply to affected area once   meloxicam 15 MG tablet Commonly known as: MOBIC TAKE ONE TABLET BY MOUTH DAILY   metFORMIN 500 MG 24 hr tablet Commonly known as: GLUCOPHAGE-XR Take 1 tablet (500 mg total) by mouth in the morning and at bedtime.   omeprazole 20 MG capsule Commonly known as: PRILOSEC Take 1 capsule (20 mg total) by mouth daily.   ondansetron 8 MG tablet Commonly known as: Zofran Take 1 tablet (8 mg  total) by mouth 2 (two) times daily as needed for refractory nausea / vomiting. Start on day 3 after carboplatin chemo.   ONE TOUCH ULTRA 2 w/Device Kit Check blood sugar as directed. Dx:E11.65   OneTouch Verio Flex System w/Device Kit USE AS DIRECTED TO CHECK BLOOD SUGAR FOUR TIMES A DAY   OneTouch Ultra test strip Generic drug: glucose blood USE TO CHECK BLOOD SUGAR FOUR TIMES A DAY AS DIRECTED   oxyCODONE-acetaminophen 7.5-325 MG tablet Commonly known as: Percocet Take 1 tablet by mouth every 6 (six) hours as needed for severe pain.   prochlorperazine 10 MG tablet Commonly known as: COMPAZINE Take 1 tablet (10 mg total) by mouth every 6 (six) hours as needed  (Nausea or vomiting).   Trulicity 8.30 NM/0.7WK Sopn Generic drug: Dulaglutide Inject 0.75 mg into the skin every Saturday.   UNABLE TO FIND Please provide hair prothesis for patient undergoing chemotherapy for cancer diagnosis.   valACYclovir 1000 MG tablet Commonly known as: VALTREX TAKE ONE TABLET BY MOUTH DAILY AS NEEDED        Allergies:  Allergies  Allergen Reactions   Hydrocodone-Acetaminophen Other (See Comments)    Nightmares, skin crawling   Lyrica [Pregabalin] Other (See Comments)    Hallucinations    Crestor [Rosuvastatin Calcium] Other (See Comments)    Leg cramps    Past Medical History, Surgical history, Social history, and Family History were reviewed and updated.  Review of Systems: All other 10 point review of systems is negative.   Physical Exam:  height is _0  (1.499 m) and weight is 120 lb 12.8 oz (54.8 kg). Her oral temperature is 98.1 F (36.7 C). Her blood pressure is 114/75 and her pulse is 91. Her respiration is 20 and oxygen saturation is 99%.   Wt Readings from Last 3 Encounters:  09/15/21 120 lb 12.8 oz (54.8 kg)  08/18/21 123 lb (55.8 kg)  07/28/21 120 lb 1.9 oz (54.5 kg)    Ocular: Sclerae unicteric, pupils equal, round and reactive to light Ear-nose-throat: Oropharynx clear, dentition fair Lymphatic: No cervical or supraclavicular adenopathy Lungs no rales or rhonchi, good excursion bilaterally Heart regular rate and rhythm, no murmur appreciated Abd soft, nontender, positive bowel sounds MSK no focal spinal tenderness, no joint edema Neuro: non-focal, well-oriented, appropriate affect Breasts: Deferred   Lab Results  Component Value Date   WBC 9.2 09/15/2021   HGB 10.7 (L) 09/15/2021   HCT 33.3 (L) 09/15/2021   MCV 101.8 (H) 09/15/2021   PLT 264 09/15/2021   No results found for: "FERRITIN", "IRON", "TIBC", "UIBC", "IRONPCTSAT" Lab Results  Component Value Date   RBC 3.27 (L) 09/15/2021   No results found for:  "KPAFRELGTCHN", "LAMBDASER", "KAPLAMBRATIO" No results found for: "IGGSERUM", "IGA", "IGMSERUM" No results found for: "TOTALPROTELP", "ALBUMINELP", "A1GS", "A2GS", "BETS", "BETA2SER", "GAMS", "MSPIKE", "SPEI"   Chemistry      Component Value Date/Time   NA 138 08/25/2021 0935   K 3.9 08/25/2021 0935   CL 104 08/25/2021 0935   CO2 26 08/25/2021 0935   BUN 17 08/25/2021 0935   CREATININE 0.66 08/25/2021 0935   CREATININE 0.88 05/08/2021 1126      Component Value Date/Time   CALCIUM 8.8 (L) 08/25/2021 0935   ALKPHOS 84 08/25/2021 0935   AST 12 (L) 08/25/2021 0935   ALT 8 08/25/2021 0935   BILITOT 0.3 08/25/2021 0935       Impression and Plan: [name] is a very pleasant 71 yo caucasian female with extensive stage  small cell lung cancer.  We will proceed with cycle 5 today along with Xgeva.  We will plan to repeat her PET scan and MRI after cycle 6.  Follow-up with MD in 3 weeks.   Lottie Dawson, NP 6/12/202310:16 AM

## 2021-09-16 ENCOUNTER — Encounter: Payer: Self-pay | Admitting: *Deleted

## 2021-09-16 ENCOUNTER — Inpatient Hospital Stay: Payer: PPO

## 2021-09-16 ENCOUNTER — Other Ambulatory Visit: Payer: Self-pay | Admitting: *Deleted

## 2021-09-16 VITALS — BP 139/82 | HR 99 | Temp 97.8°F | Resp 18

## 2021-09-16 DIAGNOSIS — C3412 Malignant neoplasm of upper lobe, left bronchus or lung: Secondary | ICD-10-CM

## 2021-09-16 DIAGNOSIS — Z5112 Encounter for antineoplastic immunotherapy: Secondary | ICD-10-CM | POA: Diagnosis not present

## 2021-09-16 LAB — T4: T4, Total: 8.2 ug/dL (ref 4.5–12.0)

## 2021-09-16 MED ORDER — SODIUM CHLORIDE 0.9% FLUSH
10.0000 mL | INTRAVENOUS | Status: DC | PRN
  Administered 2021-09-16: 10 mL

## 2021-09-16 MED ORDER — SODIUM CHLORIDE 0.9 % IV SOLN
10.0000 mg | Freq: Once | INTRAVENOUS | Status: AC
  Administered 2021-09-16: 10 mg via INTRAVENOUS
  Filled 2021-09-16: qty 10

## 2021-09-16 MED ORDER — HEPARIN SOD (PORK) LOCK FLUSH 100 UNIT/ML IV SOLN
500.0000 [IU] | Freq: Once | INTRAVENOUS | Status: AC | PRN
  Administered 2021-09-16: 500 [IU]

## 2021-09-16 MED ORDER — SODIUM CHLORIDE 0.9 % IV SOLN
80.0000 mg/m2 | Freq: Once | INTRAVENOUS | Status: AC
  Administered 2021-09-16: 120 mg via INTRAVENOUS
  Filled 2021-09-16: qty 6

## 2021-09-16 MED ORDER — SODIUM CHLORIDE 0.9 % IV SOLN: Freq: Once | INTRAVENOUS | Status: AC

## 2021-09-16 MED ORDER — AMOXICILLIN 500 MG PO TABS: ORAL_TABLET | ORAL | 3 refills | Status: DC

## 2021-09-16 NOTE — Patient Instructions (Signed)
[facility]  Discharge Instructions: Thank you for choosing [facility] to provide your oncology and hematology care.   If you have a lab appointment with the [facility], please go directly to the [facility] and check in at the registration area.  Wear comfortable clothing and clothing appropriate for easy access to any Portacath or PICC line.   We strive to give you quality time with your provider. You may need to reschedule your appointment if you arrive late (15 or more minutes).  Arriving late affects you and other patients whose appointments are after yours.  Also, if you miss three or more appointments without notifying the office, you may be dismissed from the clinic at the provider's discretion.      For prescription refill requests, have your pharmacy contact our office and allow 72 hours for refills to be completed.    Today you received the following chemotherapy and/or immunotherapy agents Etoposide       To help prevent nausea and vomiting after your treatment, we encourage you to take your nausea medication as directed.  BELOW ARE SYMPTOMS THAT SHOULD BE REPORTED IMMEDIATELY: *FEVER GREATER THAN 100.4 F (38 C) OR HIGHER *CHILLS OR SWEATING *NAUSEA AND VOMITING THAT IS NOT CONTROLLED WITH YOUR NAUSEA MEDICATION *UNUSUAL SHORTNESS OF BREATH *UNUSUAL BRUISING OR BLEEDING *URINARY PROBLEMS (pain or burning when urinating, or frequent urination) *BOWEL PROBLEMS (unusual diarrhea, constipation, pain near the anus) TENDERNESS IN MOUTH AND THROAT WITH OR WITHOUT PRESENCE OF ULCERS (sore throat, sores in mouth, or a toothache) UNUSUAL RASH, SWELLING OR PAIN  UNUSUAL VAGINAL DISCHARGE OR ITCHING   Items with * indicate a potential emergency and should be followed up as soon as possible or go to the Emergency Department if any problems should occur.  Please show the CHEMOTHERAPY ALERT CARD or IMMUNOTHERAPY ALERT CARD at check-in to the  Emergency Department and triage nurse. Should you have questions after your visit or need to cancel or reschedule your appointment, please contact [facility]  [phone] and follow the prompts.  Office hours are 8:00 a.m. to 4:30 p.m. Monday - Friday. Please note that voicemails left after 4:00 p.m. may not be returned until the following business day.  We are closed weekends and major holidays. You have access to a nurse at all times for urgent questions. Please call the main number to the clinic [phone] and follow the prompts.  For any non-urgent questions, you may also contact your provider using MyChart. We now offer e-Visits for anyone 84 and older to request care online for non-urgent symptoms. For details visit [email or web address].   Also download the MyChart app! Go to the app store, search "MyChart", open the app, select [facility], and log in with your MyChart username and password.  Due to Covid, a mask is required upon entering the hospital/clinic. If you do not have a mask, one will be given to you upon arrival. For doctor visits, patients may have 1 support person aged 11 or older with them. For treatment visits, patients cannot have anyone with them due to current Covid guidelines and our immunocompromised population.

## 2021-09-16 NOTE — Progress Notes (Signed)
Patient received initiated cycle five. She will need scans after her sixth cycle.   Oncology Nurse Navigator Documentation     09/16/2021    7:45 AM  Oncology Nurse Navigator Flowsheets  Navigator Follow Up Date: 10/06/2021  Navigator Follow Up Reason: Follow-up Appointment;Chemotherapy  Navigator Location [facility]  Navigator Encounter Type Appt/Treatment Plan Review  Patient Visit Type MedOnc  Treatment Phase Active Tx  Barriers/Navigation Needs No Barriers At This Time  Interventions None Required  Acuity Level 2-Minimal Needs (1-2 Barriers Identified)  Support Groups/Services Friends and Family  Time Spent with Patient 15

## 2021-09-17 ENCOUNTER — Inpatient Hospital Stay: Payer: PPO

## 2021-09-17 ENCOUNTER — Other Ambulatory Visit: Payer: Self-pay | Admitting: *Deleted

## 2021-09-17 VITALS — BP 107/65 | HR 99 | Temp 97.8°F | Resp 18

## 2021-09-17 DIAGNOSIS — M1990 Unspecified osteoarthritis, unspecified site: Secondary | ICD-10-CM

## 2021-09-17 DIAGNOSIS — Z5112 Encounter for antineoplastic immunotherapy: Secondary | ICD-10-CM | POA: Diagnosis not present

## 2021-09-17 DIAGNOSIS — C3412 Malignant neoplasm of upper lobe, left bronchus or lung: Secondary | ICD-10-CM

## 2021-09-17 MED ORDER — HEPARIN SOD (PORK) LOCK FLUSH 100 UNIT/ML IV SOLN
500.0000 [IU] | Freq: Once | INTRAVENOUS | Status: AC | PRN
  Administered 2021-09-17: 500 [IU]

## 2021-09-17 MED ORDER — MELOXICAM 15 MG PO TABS: 15.0000 mg | ORAL_TABLET | Freq: Every day | ORAL | 0 refills | Status: DC

## 2021-09-17 MED ORDER — SODIUM CHLORIDE 0.9% FLUSH
10.0000 mL | INTRAVENOUS | Status: DC | PRN
  Administered 2021-09-17: 10 mL

## 2021-09-17 MED ORDER — SODIUM CHLORIDE 0.9 % IV SOLN
10.0000 mg | Freq: Once | INTRAVENOUS | Status: AC
  Administered 2021-09-17: 10 mg via INTRAVENOUS
  Filled 2021-09-17: qty 10

## 2021-09-17 MED ORDER — PEGFILGRASTIM-CBQV 6 MG/0.6ML ~~LOC~~ SOSY: 6.0000 mg | PREFILLED_SYRINGE | Freq: Once | SUBCUTANEOUS | Status: DC

## 2021-09-17 MED ORDER — SODIUM CHLORIDE 0.9 % IV SOLN
80.0000 mg/m2 | Freq: Once | INTRAVENOUS | Status: AC
  Administered 2021-09-17: 120 mg via INTRAVENOUS
  Filled 2021-09-17: qty 6

## 2021-09-17 MED ORDER — SODIUM CHLORIDE 0.9 % IV SOLN: Freq: Once | INTRAVENOUS | Status: AC

## 2021-09-17 NOTE — Patient Instructions (Signed)
[facility]  Discharge Instructions: Thank you for choosing [facility] to provide your oncology and hematology care.   If you have a lab appointment with the [facility], please go directly to the [facility] and check in at the registration area.  Wear comfortable clothing and clothing appropriate for easy access to any Portacath or PICC line.   We strive to give you quality time with your provider. You may need to reschedule your appointment if you arrive late (15 or more minutes).  Arriving late affects you and other patients whose appointments are after yours.  Also, if you miss three or more appointments without notifying the office, you may be dismissed from the clinic at the provider's discretion.      For prescription refill requests, have your pharmacy contact our office and allow 72 hours for refills to be completed.    Today you received the following chemotherapy and/or immunotherapy agents Etoposide       To help prevent nausea and vomiting after your treatment, we encourage you to take your nausea medication as directed.  BELOW ARE SYMPTOMS THAT SHOULD BE REPORTED IMMEDIATELY: *FEVER GREATER THAN 100.4 F (38 C) OR HIGHER *CHILLS OR SWEATING *NAUSEA AND VOMITING THAT IS NOT CONTROLLED WITH YOUR NAUSEA MEDICATION *UNUSUAL SHORTNESS OF BREATH *UNUSUAL BRUISING OR BLEEDING *URINARY PROBLEMS (pain or burning when urinating, or frequent urination) *BOWEL PROBLEMS (unusual diarrhea, constipation, pain near the anus) TENDERNESS IN MOUTH AND THROAT WITH OR WITHOUT PRESENCE OF ULCERS (sore throat, sores in mouth, or a toothache) UNUSUAL RASH, SWELLING OR PAIN  UNUSUAL VAGINAL DISCHARGE OR ITCHING   Items with * indicate a potential emergency and should be followed up as soon as possible or go to the Emergency Department if any problems should occur.  Please show the CHEMOTHERAPY ALERT CARD or IMMUNOTHERAPY ALERT CARD at check-in to the  Emergency Department and triage nurse. Should you have questions after your visit or need to cancel or reschedule your appointment, please contact [facility]  [phone] and follow the prompts.  Office hours are 8:00 a.m. to 4:30 p.m. Monday - Friday. Please note that voicemails left after 4:00 p.m. may not be returned until the following business day.  We are closed weekends and major holidays. You have access to a nurse at all times for urgent questions. Please call the main number to the clinic [phone] and follow the prompts.  For any non-urgent questions, you may also contact your provider using MyChart. We now offer e-Visits for anyone 52 and older to request care online for non-urgent symptoms. For details visit [email or web address].   Also download the MyChart app! Go to the app store, search "MyChart", open the app, select [facility], and log in with your MyChart username and password.  Masks are optional in the cancer centers. If you would like for your care team to wear a mask while they are taking care of you, please let them know. For doctor visits, patients may have with them one support person who is at least 71 years old. At this time, visitors are not allowed in the infusion area.

## 2021-09-18 ENCOUNTER — Encounter: Payer: Self-pay | Admitting: Hematology & Oncology

## 2021-09-18 ENCOUNTER — Telehealth: Payer: Self-pay | Admitting: *Deleted

## 2021-09-18 NOTE — Telephone Encounter (Signed)
Per 09/15/21 los - called and lvm of upcoming appointments - requested callback to confirm

## 2021-09-19 ENCOUNTER — Inpatient Hospital Stay: Payer: PPO

## 2021-09-19 VITALS — BP 119/72 | HR 95 | Temp 97.9°F | Resp 16

## 2021-09-19 DIAGNOSIS — C3412 Malignant neoplasm of upper lobe, left bronchus or lung: Secondary | ICD-10-CM

## 2021-09-19 DIAGNOSIS — Z5112 Encounter for antineoplastic immunotherapy: Secondary | ICD-10-CM | POA: Diagnosis not present

## 2021-09-19 MED ORDER — PEGFILGRASTIM-CBQV 6 MG/0.6ML ~~LOC~~ SOSY
6.0000 mg | PREFILLED_SYRINGE | Freq: Once | SUBCUTANEOUS | Status: AC
  Administered 2021-09-19: 6 mg via SUBCUTANEOUS
  Filled 2021-09-19: qty 0.6

## 2021-09-19 NOTE — Patient Instructions (Signed)

## 2021-10-06 ENCOUNTER — Encounter: Payer: Self-pay | Admitting: Hematology & Oncology

## 2021-10-06 ENCOUNTER — Inpatient Hospital Stay (HOSPITAL_BASED_OUTPATIENT_CLINIC_OR_DEPARTMENT_OTHER): Payer: PPO | Admitting: Hematology & Oncology

## 2021-10-06 ENCOUNTER — Inpatient Hospital Stay: Payer: PPO | Attending: Hematology & Oncology

## 2021-10-06 ENCOUNTER — Inpatient Hospital Stay: Payer: PPO

## 2021-10-06 ENCOUNTER — Other Ambulatory Visit: Payer: Self-pay

## 2021-10-06 ENCOUNTER — Encounter: Payer: Self-pay | Admitting: *Deleted

## 2021-10-06 VITALS — BP 131/82 | HR 90 | Temp 97.8°F | Resp 18 | Ht 59.0 in | Wt 122.8 lb

## 2021-10-06 DIAGNOSIS — Z5111 Encounter for antineoplastic chemotherapy: Secondary | ICD-10-CM | POA: Diagnosis not present

## 2021-10-06 DIAGNOSIS — Z5189 Encounter for other specified aftercare: Secondary | ICD-10-CM | POA: Diagnosis not present

## 2021-10-06 DIAGNOSIS — Z79899 Other long term (current) drug therapy: Secondary | ICD-10-CM | POA: Insufficient documentation

## 2021-10-06 DIAGNOSIS — C3412 Malignant neoplasm of upper lobe, left bronchus or lung: Secondary | ICD-10-CM | POA: Insufficient documentation

## 2021-10-06 DIAGNOSIS — M549 Dorsalgia, unspecified: Secondary | ICD-10-CM | POA: Insufficient documentation

## 2021-10-06 DIAGNOSIS — Z5112 Encounter for antineoplastic immunotherapy: Secondary | ICD-10-CM | POA: Diagnosis not present

## 2021-10-06 DIAGNOSIS — E032 Hypothyroidism due to medicaments and other exogenous substances: Secondary | ICD-10-CM

## 2021-10-06 LAB — CBC WITH DIFFERENTIAL (CANCER CENTER ONLY)
Abs Immature Granulocytes: 0.04 10*3/uL (ref 0.00–0.07)
Basophils Absolute: 0 10*3/uL (ref 0.0–0.1)
Basophils Relative: 0 %
Eosinophils Absolute: 0.1 10*3/uL (ref 0.0–0.5)
Eosinophils Relative: 1 %
HCT: 32.6 % — ABNORMAL LOW (ref 36.0–46.0)
Hemoglobin: 10.7 g/dL — ABNORMAL LOW (ref 12.0–15.0)
Immature Granulocytes: 0 %
Lymphocytes Relative: 12 %
Lymphs Abs: 1.1 10*3/uL (ref 0.7–4.0)
MCH: 33.3 pg (ref 26.0–34.0)
MCHC: 32.8 g/dL (ref 30.0–36.0)
MCV: 101.6 fL — ABNORMAL HIGH (ref 80.0–100.0)
Monocytes Absolute: 0.9 10*3/uL (ref 0.1–1.0)
Monocytes Relative: 9 %
Neutro Abs: 7.3 10*3/uL (ref 1.7–7.7)
Neutrophils Relative %: 78 %
Platelet Count: 208 10*3/uL (ref 150–400)
RBC: 3.21 MIL/uL — ABNORMAL LOW (ref 3.87–5.11)
RDW: 14.5 % (ref 11.5–15.5)
WBC Count: 9.4 10*3/uL (ref 4.0–10.5)
nRBC: 0 % (ref 0.0–0.2)

## 2021-10-06 LAB — CMP (CANCER CENTER ONLY)
ALT: 10 U/L (ref 0–44)
AST: 14 U/L — ABNORMAL LOW (ref 15–41)
Albumin: 4.2 g/dL (ref 3.5–5.0)
Alkaline Phosphatase: 101 U/L (ref 38–126)
Anion gap: 9 (ref 5–15)
BUN: 15 mg/dL (ref 8–23)
CO2: 25 mmol/L (ref 22–32)
Calcium: 8.8 mg/dL — ABNORMAL LOW (ref 8.9–10.3)
Chloride: 104 mmol/L (ref 98–111)
Creatinine: 0.67 mg/dL (ref 0.44–1.00)
GFR, Estimated: >60 mL/min (ref >60)
Glucose, Bld: 209 mg/dL — ABNORMAL HIGH (ref 70–99)
Potassium: 4 mmol/L (ref 3.5–5.1)
Sodium: 138 mmol/L (ref 135–145)
Total Bilirubin: 0.3 mg/dL (ref 0.3–1.2)
Total Protein: 7.2 g/dL (ref 6.5–8.1)

## 2021-10-06 LAB — LACTATE DEHYDROGENASE: LDH: 209 U/L — ABNORMAL HIGH (ref 98–192)

## 2021-10-06 LAB — TSH: TSH: 18.081 u[IU]/mL — ABNORMAL HIGH (ref 0.350–4.500)

## 2021-10-06 MED ORDER — PALONOSETRON HCL INJECTION 0.25 MG/5ML
0.2500 mg | Freq: Once | INTRAVENOUS | Status: AC
  Administered 2021-10-06: 0.25 mg via INTRAVENOUS
  Filled 2021-10-06: qty 5

## 2021-10-06 MED ORDER — SODIUM CHLORIDE 0.9 % IV SOLN: Freq: Once | INTRAVENOUS | Status: AC

## 2021-10-06 MED ORDER — SODIUM CHLORIDE 0.9 % IV SOLN
10.0000 mg | Freq: Once | INTRAVENOUS | Status: AC
  Administered 2021-10-06: 10 mg via INTRAVENOUS
  Filled 2021-10-06: qty 10

## 2021-10-06 MED ORDER — SODIUM CHLORIDE 0.9 % IV SOLN
80.0000 mg/m2 | Freq: Once | INTRAVENOUS | Status: AC
  Administered 2021-10-06: 120 mg via INTRAVENOUS
  Filled 2021-10-06: qty 6

## 2021-10-06 MED ORDER — SODIUM CHLORIDE 0.9% FLUSH
10.0000 mL | Freq: Once | INTRAVENOUS | Status: AC
  Administered 2021-10-06: 10 mL

## 2021-10-06 MED ORDER — HEPARIN SOD (PORK) LOCK FLUSH 100 UNIT/ML IV SOLN
500.0000 [IU] | Freq: Once | INTRAVENOUS | Status: AC | PRN
  Administered 2021-10-06: 500 [IU]

## 2021-10-06 MED ORDER — SODIUM CHLORIDE 0.9 % IV SOLN
150.0000 mg | Freq: Once | INTRAVENOUS | Status: AC
  Administered 2021-10-06: 150 mg via INTRAVENOUS
  Filled 2021-10-06: qty 150

## 2021-10-06 MED ORDER — SODIUM CHLORIDE 0.9 % IV SOLN
346.5000 mg | Freq: Once | INTRAVENOUS | Status: AC
  Administered 2021-10-06: 350 mg via INTRAVENOUS
  Filled 2021-10-06: qty 35

## 2021-10-06 MED ORDER — SODIUM CHLORIDE 0.9 % IV SOLN
1200.0000 mg | Freq: Once | INTRAVENOUS | Status: AC
  Administered 2021-10-06: 1200 mg via INTRAVENOUS
  Filled 2021-10-06: qty 20

## 2021-10-06 MED ORDER — SODIUM CHLORIDE 0.9% FLUSH
10.0000 mL | INTRAVENOUS | Status: DC | PRN
  Administered 2021-10-06: 10 mL

## 2021-10-06 NOTE — Patient Instructions (Signed)
[facility]  Discharge Instructions: Thank you for choosing [facility] to provide your oncology and hematology care.   If you have a lab appointment with the [facility], please go directly to the [facility] and check in at the registration area.  Wear comfortable clothing and clothing appropriate for easy access to any Portacath or PICC line.   We strive to give you quality time with your provider. You may need to reschedule your appointment if you arrive late (15 or more minutes).  Arriving late affects you and other patients whose appointments are after yours.  Also, if you miss three or more appointments without notifying the office, you may be dismissed from the clinic at the provider's discretion.      For prescription refill requests, have your pharmacy contact our office and allow 72 hours for refills to be completed.    Today you received the following chemotherapy and/or immunotherapy agents VP Carbo Tecentriq      To help prevent nausea and vomiting after your treatment, we encourage you to take your nausea medication as directed.  BELOW ARE SYMPTOMS THAT SHOULD BE REPORTED IMMEDIATELY: *FEVER GREATER THAN 100.4 F (38 C) OR HIGHER *CHILLS OR SWEATING *NAUSEA AND VOMITING THAT IS NOT CONTROLLED WITH YOUR NAUSEA MEDICATION *UNUSUAL SHORTNESS OF BREATH *UNUSUAL BRUISING OR BLEEDING *URINARY PROBLEMS (pain or burning when urinating, or frequent urination) *BOWEL PROBLEMS (unusual diarrhea, constipation, pain near the anus) TENDERNESS IN MOUTH AND THROAT WITH OR WITHOUT PRESENCE OF ULCERS (sore throat, sores in mouth, or a toothache) UNUSUAL RASH, SWELLING OR PAIN  UNUSUAL VAGINAL DISCHARGE OR ITCHING   Items with * indicate a potential emergency and should be followed up as soon as possible or go to the Emergency Department if any problems should occur.  Please show the CHEMOTHERAPY ALERT CARD or IMMUNOTHERAPY ALERT CARD at check-in  to the Emergency Department and triage nurse. Should you have questions after your visit or need to cancel or reschedule your appointment, please contact [facility]  [phone] and follow the prompts.  Office hours are 8:00 a.m. to 4:30 p.m. Monday - Friday. Please note that voicemails left after 4:00 p.m. may not be returned until the following business day.  We are closed weekends and major holidays. You have access to a nurse at all times for urgent questions. Please call the main number to the clinic [phone] and follow the prompts.  For any non-urgent questions, you may also contact your provider using MyChart. We now offer e-Visits for anyone 22 and older to request care online for non-urgent symptoms. For details visit [email or web address].   Also download the MyChart app! Go to the app store, search "MyChart", open the app, select [facility], and log in with your MyChart username and password.  Masks are optional in the cancer centers. If you would like for your care team to wear a mask while they are taking care of you, please let them know. For doctor visits, patients may have with them one support person who is at least 71 years old. At this time, visitors are not allowed in the infusion area.

## 2021-10-06 NOTE — Patient Instructions (Signed)

## 2021-10-06 NOTE — Progress Notes (Addendum)
Patient is doing well today and ready for her next cycle. She will need restaging scans after this cycle.  PET scheduled for 11/03/2021. Patient is aware of appointment date, time and location. She knows to hold her long acting insulin after midnight and Glucophage that am.   MRI scheduled for 11/10/2021. Patient is aware of appointment date, time and location.   Radiology information sheet also reviewed and given to patient with all above information.   Patient needs dental clearance. Letter provided.   Oncology Nurse Navigator Documentation     10/06/2021    9:00 AM  Oncology Nurse Navigator Flowsheets  Navigator Follow Up Date: 11/03/2021  Navigator Follow Up Reason: Scan Review  Navigator Location [facility]  Navigator Encounter Type Treatment;Appt/Treatment Plan Review  Patient Visit Type MedOnc  Treatment Phase Active Tx  Barriers/Navigation Needs Coordination of Care;Education  Education Other  Interventions Coordination of Care;Education;Psycho-Social Support  Acuity Level 2-Minimal Needs (1-2 Barriers Identified)  Coordination of Care Radiology  Education Method Verbal;Written  Support Groups/Services Friends and Family  Time Spent with Patient 30

## 2021-10-06 NOTE — Progress Notes (Signed)
Hematology and Oncology Follow Up Visit  [name] [id number] December 16, 1950 71 y.o. 10/06/2021   Principle Diagnosis:  Small cell lung cancer -- Extensive stage   Current Therapy:        Carbo/VP-16/Tecentriq -- s/p cycle 5 -- start on 06/11/2021 Xgeva 120 mg subcu every 3 months -- next dose on 12/2021   Interim History:  [name] is here today for follow-up.  She is doing quite well.  She is having little bit of low back discomfort.  Nodular this is probably more so weather related.  She has had lower back surgery.  Overall, she is tolerating chemotherapy incredibly well.  This will be her last cycle of treatment.  She has had no cough.  There is been no nausea or vomiting.  She has had no mouth sores.  She has had no diarrhea.  There is been no bleeding.  She has had no rashes.  Overall, I would say performance status is probably ECOG 1.     Medications:  Allergies as of 10/06/2021       Reactions   Hydrocodone-acetaminophen Other (See Comments)   Nightmares, skin crawling   Lyrica [pregabalin] Other (See Comments)   Hallucinations    Crestor [rosuvastatin Calcium] Other (See Comments)   Leg cramps        Medication List        Accurate as of October 06, 2021  8:08 AM. If you have any questions, ask your nurse or doctor.          acetaminophen 500 MG tablet Commonly known as: TYLENOL Take 1,000 mg by mouth every 6 (six) hours as needed for moderate pain.   albuterol 108 (90 Base) MCG/ACT inhaler Commonly known as: VENTOLIN HFA Inhale 2 puffs into the lungs every 6 (six) hours as needed for wheezing or shortness of breath.   ALPRAZolam 0.5 MG tablet Commonly known as: XANAX TAKE ONE TABLET BY MOUTH THREE TIMES A DAY AS NEEDED   amoxicillin 500 MG tablet Commonly known as: AMOXIL Take one hour before dental procedure.   blood glucose meter kit and supplies Kit Use daily   chlorhexidine 0.12 % solution Commonly known as: PERIDEX Use as directed 15 mLs in  the mouth or throat 2 (two) times daily.   fentaNYL 12 MCG/HR Commonly known as: [place] 1 patch onto the skin every 3 (three) days.   FLUoxetine 20 MG capsule Commonly known as: PROZAC Take 1 capsule (20 mg total) by mouth daily.   Insulin Pen Needle 32G X 5 MM Misc Commonly known as: NovoTwist Use daily at Bedtime with Lantus   Lantus SoloStar 100 UNIT/ML Solostar Pen Generic drug: insulin glargine Inject 30 Units into the skin daily. HS   levothyroxine 100 MCG tablet Commonly known as: Synthroid Take 1 tablet (100 mcg total) by mouth daily before breakfast.   lidocaine-prilocaine cream Commonly known as: EMLA Apply to affected area once   meloxicam 15 MG tablet Commonly known as: MOBIC Take 1 tablet (15 mg total) by mouth daily.   metFORMIN 500 MG 24 hr tablet Commonly known as: GLUCOPHAGE-XR Take 1 tablet (500 mg total) by mouth in the morning and at bedtime.   omeprazole 20 MG capsule Commonly known as: PRILOSEC Take 1 capsule (20 mg total) by mouth daily.   ondansetron 8 MG tablet Commonly known as: Zofran Take 1 tablet (8 mg total) by mouth 2 (two) times daily as needed for refractory nausea / vomiting. Start on day 3  after carboplatin chemo.   ONE TOUCH ULTRA 2 w/Device Kit Check blood sugar as directed. Dx:E11.65   OneTouch Verio Flex System w/Device Kit USE AS DIRECTED TO CHECK BLOOD SUGAR FOUR TIMES A DAY   OneTouch Ultra test strip Generic drug: glucose blood USE TO CHECK BLOOD SUGAR FOUR TIMES A DAY AS DIRECTED   oxyCODONE-acetaminophen 7.5-325 MG tablet Commonly known as: Percocet Take 1 tablet by mouth every 6 (six) hours as needed for severe pain.   prochlorperazine 10 MG tablet Commonly known as: COMPAZINE Take 1 tablet (10 mg total) by mouth every 6 (six) hours as needed (Nausea or vomiting).   Trulicity 9.74 BU/3.8GT Sopn Generic drug: Dulaglutide Inject 0.75 mg into the skin every Saturday.   UNABLE TO FIND Please provide  hair prothesis for patient undergoing chemotherapy for cancer diagnosis.   valACYclovir 1000 MG tablet Commonly known as: VALTREX TAKE ONE TABLET BY MOUTH DAILY AS NEEDED        Allergies:  Allergies  Allergen Reactions   Hydrocodone-Acetaminophen Other (See Comments)    Nightmares, skin crawling   Lyrica [Pregabalin] Other (See Comments)    Hallucinations    Crestor [Rosuvastatin Calcium] Other (See Comments)    Leg cramps    Past Medical History, Surgical history, Social history, and Family History were reviewed and updated.  Review of Systems: Review of Systems  Constitutional: Negative.   HENT: Negative.    Eyes: Negative.   Respiratory: Negative.    Cardiovascular: Negative.   Gastrointestinal: Negative.   Genitourinary: Negative.   Musculoskeletal:  Positive for back pain.  Skin: Negative.   Neurological: Negative.   Endo/Heme/Allergies: Negative.   Psychiatric/Behavioral: Negative.       Physical Exam:  vitals were not taken for this visit.   Wt Readings from Last 3 Encounters:  09/15/21 120 lb 12.8 oz (54.8 kg)  08/18/21 123 lb (55.8 kg)  07/28/21 120 lb 1.9 oz (54.5 kg)  Vital signs show a temperature of 97.8.  Pulse 90.  Blood pressure is 131/82.  Weight is 122 pounds.  Physical Exam Vitals reviewed.  HENT:     Head: Normocephalic and atraumatic.  Eyes:     Pupils: Pupils are equal, round, and reactive to light.  Cardiovascular:     Rate and Rhythm: Normal rate and regular rhythm.     Heart sounds: Normal heart sounds.  Pulmonary:     Effort: Pulmonary effort is normal.     Breath sounds: Normal breath sounds.  Abdominal:     General: Bowel sounds are normal.     Palpations: Abdomen is soft.  Musculoskeletal:        General: No tenderness or deformity. Normal range of motion.     Cervical back: Normal range of motion.  Lymphadenopathy:     Cervical: No cervical adenopathy.  Skin:    General: Skin is warm and dry.     Findings: No  erythema or rash.  Neurological:     Mental Status: She is alert and oriented to person, place, and time.  Psychiatric:        Behavior: Behavior normal.        Thought Content: Thought content normal.        Judgment: Judgment normal.     Lab Results  Component Value Date   WBC 9.2 09/15/2021   HGB 10.7 (L) 09/15/2021   HCT 33.3 (L) 09/15/2021   MCV 101.8 (H) 09/15/2021   PLT 264 09/15/2021   No results found for: "  FERRITIN", "IRON", "TIBC", "UIBC", "IRONPCTSAT" Lab Results  Component Value Date   RBC 3.27 (L) 09/15/2021   No results found for: "KPAFRELGTCHN", "LAMBDASER", "KAPLAMBRATIO" No results found for: "IGGSERUM", "IGA", "IGMSERUM" No results found for: "TOTALPROTELP", "ALBUMINELP", "A1GS", "A2GS", "BETS", "BETA2SER", "GAMS", "MSPIKE", "SPEI"   Chemistry      Component Value Date/Time   NA 137 09/15/2021 0935   K 4.2 09/15/2021 0935   CL 102 09/15/2021 0935   CO2 27 09/15/2021 0935   BUN 13 09/15/2021 0935   CREATININE 0.70 09/15/2021 0935   CREATININE 0.88 05/08/2021 1126      Component Value Date/Time   CALCIUM 9.2 09/15/2021 0935   ALKPHOS 90 09/15/2021 0935   AST 13 (L) 09/15/2021 0935   ALT 10 09/15/2021 0935   BILITOT 0.3 09/15/2021 0935       Impression and Plan: Ms. Foxworth is a very pleasant 71 yo caucasian female with extensive stage small cell lung cancer.   We will go ahead with her sixth cycle of treatment.  Again after this 1, we will then repeat her PET scan and MRI of the brain.  We will then see about immunotherapy for her.  I think this would be a reasonable option.  Another consideration might be to utilize consolidative radiation therapy to the primary in the chest.  We will have the scans done about 3 or 4 weeks.  I will plan to get her back to see me in 5 weeks.  7/3/20238:08 AM

## 2021-10-07 LAB — T4: T4, Total: 6.1 ug/dL (ref 4.5–12.0)

## 2021-10-08 ENCOUNTER — Telehealth: Payer: Self-pay

## 2021-10-08 ENCOUNTER — Inpatient Hospital Stay: Payer: PPO

## 2021-10-08 VITALS — BP 121/75 | HR 91 | Temp 97.8°F | Resp 18

## 2021-10-08 DIAGNOSIS — C3412 Malignant neoplasm of upper lobe, left bronchus or lung: Secondary | ICD-10-CM

## 2021-10-08 DIAGNOSIS — Z5112 Encounter for antineoplastic immunotherapy: Secondary | ICD-10-CM | POA: Diagnosis not present

## 2021-10-08 MED ORDER — SODIUM CHLORIDE 0.9 % IV SOLN
10.0000 mg | Freq: Once | INTRAVENOUS | Status: AC
  Administered 2021-10-08: 10 mg via INTRAVENOUS
  Filled 2021-10-08: qty 10

## 2021-10-08 MED ORDER — SODIUM CHLORIDE 0.9 % IV SOLN
80.0000 mg/m2 | Freq: Once | INTRAVENOUS | Status: AC
  Administered 2021-10-08: 120 mg via INTRAVENOUS
  Filled 2021-10-08: qty 6

## 2021-10-08 MED ORDER — SODIUM CHLORIDE 0.9% FLUSH
10.0000 mL | INTRAVENOUS | Status: DC | PRN
  Administered 2021-10-08: 10 mL

## 2021-10-08 MED ORDER — SODIUM CHLORIDE 0.9 % IV SOLN: Freq: Once | INTRAVENOUS | Status: AC

## 2021-10-08 MED ORDER — HEPARIN SOD (PORK) LOCK FLUSH 100 UNIT/ML IV SOLN
500.0000 [IU] | Freq: Once | INTRAVENOUS | Status: AC | PRN
  Administered 2021-10-08: 500 [IU]

## 2021-10-08 NOTE — Patient Instructions (Signed)
Etoposide, VP-16 injection What is this medication? ETOPOSIDE, VP-16 (e toe POE side) is a chemotherapy drug. It is used to treat testicular cancer, lung cancer, and other cancers. This medicine may be used for other purposes; ask your health care provider or pharmacist if you have questions. COMMON BRAND NAME(S): Etopophos, Toposar, VePesid What should I tell my care team before I take this medication? They need to know if you have any of these conditions: infection kidney disease liver disease low blood counts, like low white cell, platelet, or red cell counts an unusual or allergic reaction to etoposide, other medicines, foods, dyes, or preservatives pregnant or trying to get pregnant breast-feeding How should I use this medication? This medicine is for infusion into a vein. It is administered in a hospital or clinic by a specially trained health care professional. Talk to your pediatrician regarding the use of this medicine in children. Special care may be needed. Overdosage: If you think you have taken too much of this medicine contact a poison control center or emergency room at once. NOTE: This medicine is only for you. Do not share this medicine with others. What if I miss a dose? It is important not to miss your dose. Call your doctor or health care professional if you are unable to keep an appointment. What may interact with this medication? This medicine may interact with the following medications: warfarin This list may not describe all possible interactions. Give your health care provider a list of all the medicines, herbs, non-prescription drugs, or dietary supplements you use. Also tell them if you smoke, drink alcohol, or use illegal drugs. Some items may interact with your medicine. What should I watch for while using this medication? Visit your doctor for checks on your progress. This drug may make you feel generally unwell. This is not uncommon, as chemotherapy can affect  healthy cells as well as cancer cells. Report any side effects. Continue your course of treatment even though you feel ill unless your doctor tells you to stop. In some cases, you may be given additional medicines to help with side effects. Follow all directions for their use. Call your doctor or health care professional for advice if you get a fever, chills or sore throat, or other symptoms of a cold or flu. Do not treat yourself. This drug decreases your body's ability to fight infections. Try to avoid being around people who are sick. This medicine may increase your risk to bruise or bleed. Call your doctor or health care professional if you notice any unusual bleeding. Talk to your doctor about your risk of cancer. You may be more at risk for certain types of cancers if you take this medicine. Do not become pregnant while taking this medicine or for at least 6 months after stopping it. Women should inform their doctor if they wish to become pregnant or think they might be pregnant. Women of child-bearing potential will need to have a negative pregnancy test before starting this medicine. There is a potential for serious side effects to an unborn child. Talk to your health care professional or pharmacist for more information. Do not breast-feed an infant while taking this medicine. Men must use a latex condom during sexual contact with a woman while taking this medicine and for at least 4 months after stopping it. A latex condom is needed even if you have had a vasectomy. Contact your doctor right away if your partner becomes pregnant. Do not donate sperm while taking this  medicine and for at least 4 months after you stop taking this medicine. Men should inform their doctors if they wish to father a child. This medicine may lower sperm counts. What side effects may I notice from receiving this medication? Side effects that you should report to your doctor or health care professional as soon as  possible: allergic reactions like skin rash, itching or hives, swelling of the face, lips, or tongue low blood counts - this medicine may decrease the number of white blood cells, red blood cells, and platelets. You may be at increased risk for infections and bleeding nausea, vomiting redness, blistering, peeling or loosening of the skin, including inside the mouth signs and symptoms of infection like fever; chills; cough; sore throat; pain or trouble passing urine signs and symptoms of low red blood cells or anemia such as unusually weak or tired; feeling faint or lightheaded; falls; breathing problems unusual bruising or bleeding Side effects that usually do not require medical attention (report to your doctor or health care professional if they continue or are bothersome): changes in taste diarrhea hair loss loss of appetite mouth sores This list may not describe all possible side effects. Call your doctor for medical advice about side effects. You may report side effects to FDA at 1-800-FDA-1088. Where should I keep my medication? This drug is given in a hospital or clinic and will not be stored at home. NOTE: This sheet is a summary. It may not cover all possible information. If you have questions about this medicine, talk to your doctor, pharmacist, or health care provider.  2023 Elsevier/Gold Standard (2021-02-21 00:00:00)

## 2021-10-08 NOTE — Telephone Encounter (Addendum)
Message given to patient during infusion appt today. Pt states she does not need a new script for Synthroid at this time. Verbalizes understanding using teachback.  dph  ----- Message from [name], MD sent at 10/06/2021  4:57 PM EDT ----- Please call and let him know that the thyroid is still on the low side.  We need to increase her Synthroid up to 125 mcg.  Please call this into the pharmacy on Wednesday.  Thanks.  Laurey Arrow

## 2021-10-09 ENCOUNTER — Inpatient Hospital Stay: Payer: PPO

## 2021-10-09 VITALS — BP 113/81 | HR 81 | Temp 98.3°F | Resp 18

## 2021-10-09 DIAGNOSIS — C3412 Malignant neoplasm of upper lobe, left bronchus or lung: Secondary | ICD-10-CM

## 2021-10-09 DIAGNOSIS — Z5112 Encounter for antineoplastic immunotherapy: Secondary | ICD-10-CM | POA: Diagnosis not present

## 2021-10-09 DIAGNOSIS — E032 Hypothyroidism due to medicaments and other exogenous substances: Secondary | ICD-10-CM

## 2021-10-09 MED ORDER — LEVOTHYROXINE SODIUM 125 MCG PO TABS: 125.0000 ug | ORAL_TABLET | Freq: Every day | ORAL | 3 refills | Status: DC

## 2021-10-09 MED ORDER — SODIUM CHLORIDE 0.9% FLUSH
10.0000 mL | INTRAVENOUS | Status: DC | PRN
  Administered 2021-10-09: 10 mL

## 2021-10-09 MED ORDER — HEPARIN SOD (PORK) LOCK FLUSH 100 UNIT/ML IV SOLN
500.0000 [IU] | Freq: Once | INTRAVENOUS | Status: AC | PRN
  Administered 2021-10-09: 500 [IU]

## 2021-10-09 MED ORDER — SODIUM CHLORIDE 0.9 % IV SOLN
80.0000 mg/m2 | Freq: Once | INTRAVENOUS | Status: AC
  Administered 2021-10-09: 120 mg via INTRAVENOUS
  Filled 2021-10-09: qty 6

## 2021-10-09 MED ORDER — SODIUM CHLORIDE 0.9 % IV SOLN: Freq: Once | INTRAVENOUS | Status: AC

## 2021-10-09 MED ORDER — SODIUM CHLORIDE 0.9 % IV SOLN
10.0000 mg | Freq: Once | INTRAVENOUS | Status: AC
  Administered 2021-10-09: 10 mg via INTRAVENOUS
  Filled 2021-10-09: qty 10

## 2021-10-09 NOTE — Patient Instructions (Addendum)
Etoposide, VP-16 injection What is this medication? ETOPOSIDE, VP-16 (e toe POE side) is a chemotherapy drug. It is used to treat testicular cancer, lung cancer, and other cancers. This medicine may be used for other purposes; ask your health care provider or pharmacist if you have questions. COMMON BRAND NAME(S): Etopophos, Toposar, VePesid What should I tell my care team before I take this medication? They need to know if you have any of these conditions: infection kidney disease liver disease low blood counts, like low white cell, platelet, or red cell counts an unusual or allergic reaction to etoposide, other medicines, foods, dyes, or preservatives pregnant or trying to get pregnant breast-feeding How should I use this medication? This medicine is for infusion into a vein. It is administered in a hospital or clinic by a specially trained health care professional. Talk to your pediatrician regarding the use of this medicine in children. Special care may be needed. Overdosage: If you think you have taken too much of this medicine contact a poison control center or emergency room at once. NOTE: This medicine is only for you. Do not share this medicine with others. What if I miss a dose? It is important not to miss your dose. Call your doctor or health care professional if you are unable to keep an appointment. What may interact with this medication? This medicine may interact with the following medications: warfarin This list may not describe all possible interactions. Give your health care provider a list of all the medicines, herbs, non-prescription drugs, or dietary supplements you use. Also tell them if you smoke, drink alcohol, or use illegal drugs. Some items may interact with your medicine. What should I watch for while using this medication? Visit your doctor for checks on your progress. This drug may make you feel generally unwell. This is not uncommon, as chemotherapy can affect  healthy cells as well as cancer cells. Report any side effects. Continue your course of treatment even though you feel ill unless your doctor tells you to stop. In some cases, you may be given additional medicines to help with side effects. Follow all directions for their use. Call your doctor or health care professional for advice if you get a fever, chills or sore throat, or other symptoms of a cold or flu. Do not treat yourself. This drug decreases your body's ability to fight infections. Try to avoid being around people who are sick. This medicine may increase your risk to bruise or bleed. Call your doctor or health care professional if you notice any unusual bleeding. Talk to your doctor about your risk of cancer. You may be more at risk for certain types of cancers if you take this medicine. Do not become pregnant while taking this medicine or for at least 6 months after stopping it. Women should inform their doctor if they wish to become pregnant or think they might be pregnant. Women of child-bearing potential will need to have a negative pregnancy test before starting this medicine. There is a potential for serious side effects to an unborn child. Talk to your health care professional or pharmacist for more information. Do not breast-feed an infant while taking this medicine. Men must use a latex condom during sexual contact with a woman while taking this medicine and for at least 4 months after stopping it. A latex condom is needed even if you have had a vasectomy. Contact your doctor right away if your partner becomes pregnant. Do not donate sperm while taking this  medicine and for at least 4 months after you stop taking this medicine. Men should inform their doctors if they wish to father a child. This medicine may lower sperm counts. What side effects may I notice from receiving this medication? Side effects that you should report to your doctor or health care professional as soon as  possible: allergic reactions like skin rash, itching or hives, swelling of the face, lips, or tongue low blood counts - this medicine may decrease the number of white blood cells, red blood cells, and platelets. You may be at increased risk for infections and bleeding nausea, vomiting redness, blistering, peeling or loosening of the skin, including inside the mouth signs and symptoms of infection like fever; chills; cough; sore throat; pain or trouble passing urine signs and symptoms of low red blood cells or anemia such as unusually weak or tired; feeling faint or lightheaded; falls; breathing problems unusual bruising or bleeding Side effects that usually do not require medical attention (report to your doctor or health care professional if they continue or are bothersome): changes in taste diarrhea hair loss loss of appetite mouth sores This list may not describe all possible side effects. Call your doctor for medical advice about side effects. You may report side effects to FDA at 1-800-FDA-1088. Where should I keep my medication? This drug is given in a hospital or clinic and will not be stored at home. NOTE: This sheet is a summary. It may not cover all possible information. If you have questions about this medicine, talk to your doctor, pharmacist, or health care provider.  2023 Elsevier/Gold Standard (2021-02-21 00:00:00) [facility]  Discharge Instructions: Thank you for choosing [facility] to provide your oncology and hematology care.   If you have a lab appointment with the [facility], please go directly to the [facility] and check in at the registration area.  Wear comfortable clothing and clothing appropriate for easy access to any Portacath or PICC line.   We strive to give you quality time with your provider. You may need to reschedule your appointment if you arrive late (15 or more minutes).  Arriving late affects you and  other patients whose appointments are after yours.  Also, if you miss three or more appointments without notifying the office, you may be dismissed from the clinic at the provider's discretion.      For prescription refill requests, have your pharmacy contact our office and allow 72 hours for refills to be completed.    Today you received the following chemotherapy and/or immunotherapy agents vp-16    To help prevent nausea and vomiting after your treatment, we encourage you to take your nausea medication as directed.  BELOW ARE SYMPTOMS THAT SHOULD BE REPORTED IMMEDIATELY: *FEVER GREATER THAN 100.4 F (38 C) OR HIGHER *CHILLS OR SWEATING *NAUSEA AND VOMITING THAT IS NOT CONTROLLED WITH YOUR NAUSEA MEDICATION *UNUSUAL SHORTNESS OF BREATH *UNUSUAL BRUISING OR BLEEDING *URINARY PROBLEMS (pain or burning when urinating, or frequent urination) *BOWEL PROBLEMS (unusual diarrhea, constipation, pain near the anus) TENDERNESS IN MOUTH AND THROAT WITH OR WITHOUT PRESENCE OF ULCERS (sore throat, sores in mouth, or a toothache) UNUSUAL RASH, SWELLING OR PAIN  UNUSUAL VAGINAL DISCHARGE OR ITCHING   Items with * indicate a potential emergency and should be followed up as soon as possible or go to the Emergency Department if any problems should occur.  Please show the CHEMOTHERAPY ALERT CARD or IMMUNOTHERAPY ALERT CARD at check-in  to the Emergency Department and triage nurse. Should you have questions after your visit or need to cancel or reschedule your appointment, please contact [facility]  [phone] and follow the prompts.  Office hours are 8:00 a.m. to 4:30 p.m. Monday - Friday. Please note that voicemails left after 4:00 p.m. may not be returned until the following business day.  We are closed weekends and major holidays. You have access to a nurse at all times for urgent questions. Please call the main number to the clinic [phone] and follow the prompts.  For  any non-urgent questions, you may also contact your provider using MyChart. We now offer e-Visits for anyone 76 and older to request care online for non-urgent symptoms. For details visit [email or web address].   Also download the MyChart app! Go to the app store, search "MyChart", open the app, select [facility], and log in with your MyChart username and password.  Masks are optional in the cancer centers. If you would like for your care team to wear a mask while they are taking care of you, please let them know. For doctor visits, patients may have with them one support person who is at least 71 years old. At this time, visitors are not allowed in the infusion area.

## 2021-10-13 ENCOUNTER — Inpatient Hospital Stay: Payer: PPO

## 2021-10-13 VITALS — BP 135/72 | HR 93 | Temp 97.8°F | Resp 17

## 2021-10-13 DIAGNOSIS — C3412 Malignant neoplasm of upper lobe, left bronchus or lung: Secondary | ICD-10-CM

## 2021-10-13 DIAGNOSIS — Z5112 Encounter for antineoplastic immunotherapy: Secondary | ICD-10-CM | POA: Diagnosis not present

## 2021-10-13 MED ORDER — PEGFILGRASTIM-CBQV 6 MG/0.6ML ~~LOC~~ SOSY
6.0000 mg | PREFILLED_SYRINGE | Freq: Once | SUBCUTANEOUS | Status: AC
  Administered 2021-10-13: 6 mg via SUBCUTANEOUS
  Filled 2021-10-13: qty 0.6

## 2021-10-16 ENCOUNTER — Other Ambulatory Visit: Payer: Self-pay | Admitting: Family Medicine

## 2021-10-16 DIAGNOSIS — F419 Anxiety disorder, unspecified: Secondary | ICD-10-CM

## 2021-10-24 ENCOUNTER — Other Ambulatory Visit: Payer: Self-pay | Admitting: *Deleted

## 2021-10-24 MED ORDER — OXYCODONE-ACETAMINOPHEN 7.5-325 MG PO TABS: 1.0000 | ORAL_TABLET | Freq: Four times a day (QID) | ORAL | 0 refills | Status: DC | PRN

## 2021-10-27 ENCOUNTER — Other Ambulatory Visit: Payer: Self-pay

## 2021-10-28 ENCOUNTER — Telehealth: Payer: Self-pay

## 2021-10-28 NOTE — Telephone Encounter (Signed)
Amy from [facility], DD office called stating pt came in for dental cleaning today and she does need a crown but wanted to make sure that was okay with Dr.Ennever since she is on active tx. Informed her Dr.Ennever was out for the afternoon but would get back with her tomorrow.

## 2021-10-29 ENCOUNTER — Telehealth: Payer: Self-pay | Admitting: *Deleted

## 2021-10-29 NOTE — Telephone Encounter (Signed)
Notified Dr Marin Olp that patient is in need of a crown.  Dr Marin Olp states this is fine as long as patient is on Antibiotics.  Notified Amy in Dr Alyson Reedy office.  They will prescribe the antibiotics prior to procedure as dictated by Dr Marin Olp

## 2021-11-03 ENCOUNTER — Ambulatory Visit (HOSPITAL_COMMUNITY)
Admission: RE | Admit: 2021-11-03 | Discharge: 2021-11-03 | Disposition: A | Discharge status: Home / Self Care | Payer: PPO | Source: Ambulatory Visit | Attending: Hematology & Oncology | Admitting: Hematology & Oncology

## 2021-11-03 ENCOUNTER — Encounter: Payer: Self-pay | Admitting: *Deleted

## 2021-11-03 ENCOUNTER — Telehealth: Payer: Self-pay

## 2021-11-03 DIAGNOSIS — C3412 Malignant neoplasm of upper lobe, left bronchus or lung: Secondary | ICD-10-CM | POA: Diagnosis not present

## 2021-11-03 DIAGNOSIS — C7951 Secondary malignant neoplasm of bone: Secondary | ICD-10-CM | POA: Diagnosis not present

## 2021-11-03 DIAGNOSIS — C3490 Malignant neoplasm of unspecified part of unspecified bronchus or lung: Secondary | ICD-10-CM | POA: Diagnosis not present

## 2021-11-03 LAB — GLUCOSE, CAPILLARY: Glucose-Capillary: 132 mg/dL — ABNORMAL HIGH (ref 70–99)

## 2021-11-03 MED ORDER — FLUDEOXYGLUCOSE F - 18 (FDG) INJECTION
6.1000 | Freq: Once | INTRAVENOUS | Status: AC
  Administered 2021-11-03: 6.08 via INTRAVENOUS

## 2021-11-03 NOTE — Telephone Encounter (Signed)
Advised via MyChart.

## 2021-11-03 NOTE — Telephone Encounter (Signed)
-----   Message from [name], MD sent at 11/03/2021  1:18 PM EDT ----- Call let her know that the PET scan does seem to show that where the tumor started might be a little bit more active.  Everything else looks a little bit better.  I think that she will definitely need radiation therapy.

## 2021-11-04 ENCOUNTER — Other Ambulatory Visit: Payer: Self-pay

## 2021-11-10 ENCOUNTER — Ambulatory Visit (HOSPITAL_COMMUNITY)
Admission: RE | Admit: 2021-11-10 | Discharge: 2021-11-10 | Disposition: A | Discharge status: Home / Self Care | Payer: PPO | Source: Ambulatory Visit | Attending: Hematology & Oncology | Admitting: Hematology & Oncology

## 2021-11-10 DIAGNOSIS — I6381 Other cerebral infarction due to occlusion or stenosis of small artery: Secondary | ICD-10-CM | POA: Diagnosis not present

## 2021-11-10 DIAGNOSIS — C3412 Malignant neoplasm of upper lobe, left bronchus or lung: Secondary | ICD-10-CM | POA: Insufficient documentation

## 2021-11-10 DIAGNOSIS — C3490 Malignant neoplasm of unspecified part of unspecified bronchus or lung: Secondary | ICD-10-CM | POA: Diagnosis not present

## 2021-11-10 MED ORDER — GADOBUTROL 1 MMOL/ML IV SOLN
5.0000 mL | Freq: Once | INTRAVENOUS | Status: AC | PRN
  Administered 2021-11-10: 5 mL via INTRAVENOUS

## 2021-11-11 ENCOUNTER — Encounter: Payer: Self-pay | Admitting: *Deleted

## 2021-11-11 ENCOUNTER — Telehealth: Payer: Self-pay | Admitting: *Deleted

## 2021-11-11 DIAGNOSIS — C3412 Malignant neoplasm of upper lobe, left bronchus or lung: Secondary | ICD-10-CM

## 2021-11-11 NOTE — Telephone Encounter (Signed)
-----   Message from [name], MD sent at 11/11/2021  6:15 AM EDT ----- Call - the MRI of the brain is normal -- no cancer!!  [name]

## 2021-11-11 NOTE — Telephone Encounter (Signed)
As noted below by Dr. Marin Olp, I informed the patient that the MRI of the brain is normal and there is no cancer. She verbalized understanding.

## 2021-11-11 NOTE — Progress Notes (Signed)
Reviewed patient's PET and MRI. Referral placed to Radiation Oncology per Dr Antonieta Pert recommendation.   Oncology Nurse Navigator Documentation     11/11/2021    7:45 AM  Oncology Nurse Navigator Flowsheets  Navigator Follow Up Date: 11/14/2021  Navigator Follow Up Reason: Follow-up Appointment;Chemotherapy  Navigator Location [facility]  Navigator Encounter Type Scan Review  Patient Visit Type MedOnc  Treatment Phase Active Tx  Barriers/Navigation Needs Coordination of Care;Education  Interventions Referrals  Acuity Level 2-Minimal Needs (1-2 Barriers Identified)  Referrals Radiation Oncology  Support Groups/Services Friends and Family  Time Spent with Patient 15

## 2021-11-12 ENCOUNTER — Other Ambulatory Visit: Payer: Self-pay

## 2021-11-14 ENCOUNTER — Encounter: Payer: Self-pay | Admitting: Hematology & Oncology

## 2021-11-14 ENCOUNTER — Encounter: Payer: Self-pay | Admitting: *Deleted

## 2021-11-14 ENCOUNTER — Inpatient Hospital Stay: Payer: PPO

## 2021-11-14 ENCOUNTER — Inpatient Hospital Stay: Payer: PPO | Attending: Hematology & Oncology

## 2021-11-14 ENCOUNTER — Telehealth: Payer: Self-pay | Admitting: *Deleted

## 2021-11-14 ENCOUNTER — Inpatient Hospital Stay (HOSPITAL_BASED_OUTPATIENT_CLINIC_OR_DEPARTMENT_OTHER): Payer: PPO | Admitting: Hematology & Oncology

## 2021-11-14 VITALS — BP 132/82 | HR 91 | Temp 97.8°F | Resp 18 | Ht 59.5 in | Wt 121.0 lb

## 2021-11-14 DIAGNOSIS — Z79899 Other long term (current) drug therapy: Secondary | ICD-10-CM | POA: Insufficient documentation

## 2021-11-14 DIAGNOSIS — C3412 Malignant neoplasm of upper lobe, left bronchus or lung: Secondary | ICD-10-CM | POA: Insufficient documentation

## 2021-11-14 DIAGNOSIS — M549 Dorsalgia, unspecified: Secondary | ICD-10-CM | POA: Diagnosis not present

## 2021-11-14 DIAGNOSIS — Z885 Allergy status to narcotic agent status: Secondary | ICD-10-CM | POA: Diagnosis not present

## 2021-11-14 DIAGNOSIS — Z95828 Presence of other vascular implants and grafts: Secondary | ICD-10-CM

## 2021-11-14 DIAGNOSIS — G9589 Other specified diseases of spinal cord: Secondary | ICD-10-CM | POA: Diagnosis not present

## 2021-11-14 LAB — CBC WITH DIFFERENTIAL (CANCER CENTER ONLY)
Abs Immature Granulocytes: 0.04 10*3/uL (ref 0.00–0.07)
Basophils Absolute: 0 10*3/uL (ref 0.0–0.1)
Basophils Relative: 0 %
Eosinophils Absolute: 0.1 10*3/uL (ref 0.0–0.5)
Eosinophils Relative: 1 %
HCT: 33.9 % — ABNORMAL LOW (ref 36.0–46.0)
Hemoglobin: 11 g/dL — ABNORMAL LOW (ref 12.0–15.0)
Immature Granulocytes: 1 %
Lymphocytes Relative: 11 %
Lymphs Abs: 0.9 10*3/uL (ref 0.7–4.0)
MCH: 32.3 pg (ref 26.0–34.0)
MCHC: 32.4 g/dL (ref 30.0–36.0)
MCV: 99.4 fL (ref 80.0–100.0)
Monocytes Absolute: 0.6 10*3/uL (ref 0.1–1.0)
Monocytes Relative: 7 %
Neutro Abs: 6.7 10*3/uL (ref 1.7–7.7)
Neutrophils Relative %: 80 %
Platelet Count: 196 10*3/uL (ref 150–400)
RBC: 3.41 MIL/uL — ABNORMAL LOW (ref 3.87–5.11)
RDW: 13.4 % (ref 11.5–15.5)
WBC Count: 8.4 10*3/uL (ref 4.0–10.5)
nRBC: 0 % (ref 0.0–0.2)

## 2021-11-14 LAB — LACTATE DEHYDROGENASE: LDH: 176 U/L (ref 98–192)

## 2021-11-14 LAB — CMP (CANCER CENTER ONLY)
ALT: 10 U/L (ref 0–44)
AST: 13 U/L — ABNORMAL LOW (ref 15–41)
Albumin: 4.3 g/dL (ref 3.5–5.0)
Alkaline Phosphatase: 59 U/L (ref 38–126)
Anion gap: 8 (ref 5–15)
BUN: 16 mg/dL (ref 8–23)
CO2: 28 mmol/L (ref 22–32)
Calcium: 9.7 mg/dL (ref 8.9–10.3)
Chloride: 103 mmol/L (ref 98–111)
Creatinine: 0.68 mg/dL (ref 0.44–1.00)
GFR, Estimated: >60 mL/min (ref >60)
Glucose, Bld: 98 mg/dL (ref 70–99)
Potassium: 3.9 mmol/L (ref 3.5–5.1)
Sodium: 139 mmol/L (ref 135–145)
Total Bilirubin: 0.3 mg/dL (ref 0.3–1.2)
Total Protein: 6.9 g/dL (ref 6.5–8.1)

## 2021-11-14 LAB — TSH: TSH: 0.226 u[IU]/mL — ABNORMAL LOW (ref 0.350–4.500)

## 2021-11-14 MED ORDER — HEPARIN SOD (PORK) LOCK FLUSH 100 UNIT/ML IV SOLN
500.0000 [IU] | Freq: Once | INTRAVENOUS | Status: AC
  Administered 2021-11-14: 500 [IU] via INTRAVENOUS

## 2021-11-14 MED ORDER — XTAMPZA ER 13.5 MG PO C12A: 13.5000 mg | EXTENDED_RELEASE_CAPSULE | Freq: Two times a day (BID) | ORAL | 0 refills | Status: DC

## 2021-11-14 MED ORDER — SODIUM CHLORIDE 0.9% FLUSH
10.0000 mL | Freq: Once | INTRAVENOUS | Status: AC
  Administered 2021-11-14: 10 mL via INTRAVENOUS

## 2021-11-14 NOTE — Telephone Encounter (Signed)
Call received from pharmacist at [name] to inform Dr. Marin Olp that per pt.'s insurance policy, they are only able to issue seven days of Oxycodone ER-Exampza for the first fill and after the 7 days will need another prescription sent.  HT also states that they will not have medication available for pt until Monday.  Pt notified of above and states that she does have enough pain medicine at home to get her through until Monday.

## 2021-11-14 NOTE — Progress Notes (Signed)
Hematology and Oncology Follow Up Visit  [name] [id number] 12-26-1950 71 y.o. 11/14/2021   Principle Diagnosis:  Small cell lung cancer -- Extensive stage   Current Therapy:        Carbo/VP-16/Tecentriq -- s/p cycle #6 -- start on 06/11/2021 Xgeva 120 mg subcu every 3 months -- next dose on 12/2021   Interim History:  Ms. [name] is here today for follow-up.  We went ahead and did a PET scan on her.  This was done on 11/03/2021.  PET scan showed that she did have a nice response systemically.  In the left hilum/mediastinum, she does have a slight increase in disease with increase in SUV.  The tumor now measures 3.3 x 2.5 cm.  Has an SUV of 38.  I think that she would be a great candidate for consolidative radiation therapy.  I think she has appointment with Radiation Oncology next week.  She still is having back problems.  There is no active disease in her back on PET scan.  She does have some sclerosis.  I am sure she probably has some microfractures in the lower back.  I am sure that this is where some of her discomfort is coming from.  She is on Xgeva.  She will get Xgeva in September.  I think we will have to put her on some kind of long-acting pain medication.  She is taking Percocet right now.  I will see about putting her on Xtampza. .  Her appetite is doing okay.  She has had no nausea or vomiting.  There is no change in bowel or bladder habits.  She has had no cough.  There is no fever.  She has had no bleeding.  She has had no leg swelling.  Overall, I would say performance status is probably ECOG 1.     Medications:  Allergies as of 11/14/2021       Reactions   Hydrocodone-acetaminophen Other (See Comments)   Nightmares, skin crawling   Lyrica [pregabalin] Other (See Comments)   Hallucinations    Crestor [rosuvastatin Calcium] Other (See Comments)   Leg cramps        Medication List        Accurate as of November 14, 2021 10:07 AM. If you have any questions,  ask your nurse or doctor.          acetaminophen 500 MG tablet Commonly known as: TYLENOL Take 1,000 mg by mouth every 6 (six) hours as needed for moderate pain.   albuterol 108 (90 Base) MCG/ACT inhaler Commonly known as: VENTOLIN HFA Inhale 2 puffs into the lungs every 6 (six) hours as needed for wheezing or shortness of breath.   ALPRAZolam 0.5 MG tablet Commonly known as: XANAX TAKE ONE TABLET BY MOUTH THREE TIMES A DAY AS NEEDED   amoxicillin 500 MG tablet Commonly known as: AMOXIL Take one hour before dental procedure.   blood glucose meter kit and supplies Kit Use daily   chlorhexidine 0.12 % solution Commonly known as: PERIDEX Use as directed 15 mLs in the mouth or throat 2 (two) times daily.   fentaNYL 12 MCG/HR Commonly known as: Graball 1 patch onto the skin every 3 (three) days.   FLUoxetine 20 MG capsule Commonly known as: PROZAC Take 1 capsule (20 mg total) by mouth daily.   Insulin Pen Needle 32G X 5 MM Misc Commonly known as: NovoTwist Use daily at Bedtime with Lantus   Lantus SoloStar 100 UNIT/ML Solostar Pen  Generic drug: insulin glargine Inject 30 Units into the skin daily. HS   levothyroxine 125 MCG tablet Commonly known as: Synthroid Take 1 tablet (125 mcg total) by mouth daily before breakfast.   lidocaine-prilocaine cream Commonly known as: EMLA Apply to affected area once   meloxicam 15 MG tablet Commonly known as: MOBIC Take 1 tablet (15 mg total) by mouth daily.   metFORMIN 500 MG 24 hr tablet Commonly known as: GLUCOPHAGE-XR Take 1 tablet (500 mg total) by mouth in the morning and at bedtime.   omeprazole 20 MG capsule Commonly known as: PRILOSEC Take 1 capsule (20 mg total) by mouth daily.   ondansetron 8 MG tablet Commonly known as: Zofran Take 1 tablet (8 mg total) by mouth 2 (two) times daily as needed for refractory nausea / vomiting. Start on day 3 after carboplatin chemo.   ONE TOUCH ULTRA 2 w/Device  Kit Check blood sugar as directed. Dx:E11.65   OneTouch Verio Flex System w/Device Kit USE AS DIRECTED TO CHECK BLOOD SUGAR FOUR TIMES A DAY   OneTouch Ultra test strip Generic drug: glucose blood USE TO CHECK BLOOD SUGAR FOUR TIMES A DAY AS DIRECTED   oxyCODONE-acetaminophen 7.5-325 MG tablet Commonly known as: Percocet Take 1 tablet by mouth every 6 (six) hours as needed for severe pain.   prochlorperazine 10 MG tablet Commonly known as: COMPAZINE Take 1 tablet (10 mg total) by mouth every 6 (six) hours as needed (Nausea or vomiting).   Trulicity 1.75 ZW/2.5EN Sopn Generic drug: Dulaglutide Inject 0.75 mg into the skin every Saturday.   UNABLE TO FIND Please provide hair prothesis for patient undergoing chemotherapy for cancer diagnosis.   valACYclovir 1000 MG tablet Commonly known as: VALTREX TAKE ONE TABLET BY MOUTH DAILY AS NEEDED        Allergies:  Allergies  Allergen Reactions   Hydrocodone-Acetaminophen Other (See Comments)    Nightmares, skin crawling   Lyrica [Pregabalin] Other (See Comments)    Hallucinations    Crestor [Rosuvastatin Calcium] Other (See Comments)    Leg cramps    Past Medical History, Surgical history, Social history, and Family History were reviewed and updated.  Review of Systems: Review of Systems  Constitutional: Negative.   HENT: Negative.    Eyes: Negative.   Respiratory: Negative.    Cardiovascular: Negative.   Gastrointestinal: Negative.   Genitourinary: Negative.   Musculoskeletal:  Positive for back pain.  Skin: Negative.   Neurological: Negative.   Endo/Heme/Allergies: Negative.   Psychiatric/Behavioral: Negative.       Physical Exam:  height is 4' 11.5" (1.511 m) and weight is 121 lb (54.9 kg). Her oral temperature is 97.8 F (36.6 C). Her blood pressure is 132/82 and her pulse is 91. Her respiration is 18 and oxygen saturation is 95%.   Wt Readings from Last 3 Encounters:  11/14/21 121 lb (54.9 kg)  10/06/21  122 lb 12.8 oz (55.7 kg)  09/15/21 120 lb 12.8 oz (54.8 kg)  Vital signs show a temperature of 97.8.  Pulse 90.  Blood pressure is 131/82.  Weight is 122 pounds.  Physical Exam Vitals reviewed.  HENT:     Head: Normocephalic and atraumatic.  Eyes:     Pupils: Pupils are equal, round, and reactive to light.  Cardiovascular:     Rate and Rhythm: Normal rate and regular rhythm.     Heart sounds: Normal heart sounds.  Pulmonary:     Effort: Pulmonary effort is normal.     Breath sounds:  Normal breath sounds.  Abdominal:     General: Bowel sounds are normal.     Palpations: Abdomen is soft.  Musculoskeletal:        General: No tenderness or deformity. Normal range of motion.     Cervical back: Normal range of motion.  Lymphadenopathy:     Cervical: No cervical adenopathy.  Skin:    General: Skin is warm and dry.     Findings: No erythema or rash.  Neurological:     Mental Status: She is alert and oriented to person, place, and time.  Psychiatric:        Behavior: Behavior normal.        Thought Content: Thought content normal.        Judgment: Judgment normal.     Lab Results  Component Value Date   WBC 8.4 11/14/2021   HGB 11.0 (L) 11/14/2021   HCT 33.9 (L) 11/14/2021   MCV 99.4 11/14/2021   PLT 196 11/14/2021   No results found for: "FERRITIN", "IRON", "TIBC", "UIBC", "IRONPCTSAT" Lab Results  Component Value Date   RBC 3.41 (L) 11/14/2021   No results found for: "KPAFRELGTCHN", "LAMBDASER", "KAPLAMBRATIO" No results found for: "IGGSERUM", "IGA", "IGMSERUM" No results found for: "TOTALPROTELP", "ALBUMINELP", "A1GS", "A2GS", "BETS", "BETA2SER", "GAMS", "MSPIKE", "SPEI"   Chemistry      Component Value Date/Time   NA 138 10/06/2021 0800   K 4.0 10/06/2021 0800   CL 104 10/06/2021 0800   CO2 25 10/06/2021 0800   BUN 15 10/06/2021 0800   CREATININE 0.67 10/06/2021 0800   CREATININE 0.88 05/08/2021 1126      Component Value Date/Time   CALCIUM 8.8 (L)  10/06/2021 0800   ALKPHOS 101 10/06/2021 0800   AST 14 (L) 10/06/2021 0800   ALT 10 10/06/2021 0800   BILITOT 0.3 10/06/2021 0800       Impression and Plan: Ms. Kel[facility] is a very pleasant 71 yo caucasian female with extensive stage small cell lung cancer.   She has completed all of her chemoimmunotherapy.  Now, we will have her go to radiation Oncology for consolidative radiation therapy.  I think after the radiation, I probably will consent her for maintenance immunotherapy.  I think this would be a very reasonable option for her.  I went over some of the side effects of radiation.  I told her about the odynophagia and dysphagia.  I told her that this likely would happen about the third or fourth week of treatment.  I am sure that Radiation Oncology will be able to help manage this.  I forgot to mention that she did have an MRI of the brain which did not show any disease.  She is due for Xgeva in September.  I would like to see her back probably in about 6 weeks.  By then, she should be at least halfway through radiation.  8/11/202310:07 AM

## 2021-11-14 NOTE — Progress Notes (Signed)
Patient scheduled for Radiation Oncology consult on 11/19/2021. Once radiation is complete, she will likely start maintenance immunotherapy.   Oncology Nurse Navigator Documentation     11/14/2021   10:00 AM  Oncology Nurse Navigator Flowsheets  Planned Course of Treatment Radiation  Navigator Follow Up Date: 11/19/2021  Navigator Follow Up Reason: Review Note  Navigator Location [facility]  Navigator Encounter Type Appt/Treatment Plan Review  Patient Visit Type MedOnc  Treatment Phase Active Tx  Barriers/Navigation Needs Coordination of Care;Education  Acuity Level 2-Minimal Needs (1-2 Barriers Identified)  Support Groups/Services Friends and Family  Time Spent with Patient 15

## 2021-11-14 NOTE — Patient Instructions (Signed)

## 2021-11-15 LAB — T4: T4, Total: 11.6 ug/dL (ref 4.5–12.0)

## 2021-11-17 NOTE — Progress Notes (Signed)
Thoracic Location of Tumor / Histology: extensive stage small cell lung cancer  Patient presented {numbers 1-12:19994} months ago with symptoms of: ***  Biopsies revealed:   FINAL MICROSCOPIC DIAGNOSIS:  C. LYMPH NODE, 4R, FINE NEEDLE ASPIRATION:  - Malignant cells consistent with small cell carcinoma   D. LYMPH NODE, 7, FINE NEEDLE ASPIRATION:  - Malignant cells consistent with small cell carcinoma   E. LYMPH NODE, 10L, FINE NEEDLE ASPIRATION:  - Malignant cells consistent with small cell carcinoma   FINAL MICROSCOPIC DIAGNOSIS:  A. LUNG, LLL, BRUSH:  - Malignant cells consistent with small cell carcinoma   B. LUNG, LLL, BIOPSY:  - Malignant cells consistent with small cell carcinoma   Tobacco/Marijuana/Snuff/ETOH use: former smoker  Past/Anticipated interventions by cardiothoracic surgery, if any: Flexible video fiberoptic bronchoscopy with endobronchial ultrasound and biopsies on 06/02/2021 by Dr. Lamonte Sakai  Past/Anticipated interventions by medical oncology, if any: Carbo/VP-16/Tecentriq -- s/p cycle #6 -- start on 06/11/2021 Xgeva 120 mg subcu every 3 months -- next dose on 12/2021  Signs/Symptoms Weight changes, if any: {:18581} Respiratory complaints, if any: {:18581} Hemoptysis, if any: {:18581} Pain issues, if any:  {:18581}  SAFETY ISSUES: Prior radiation? {:18581} Pacemaker/ICD? {:18581}  Possible current pregnancy?no Is the patient on methotrexate? {:18581}  Current Complaints / other details:  ***

## 2021-11-18 NOTE — Progress Notes (Signed)
Radiation Oncology         [phone] ________________________________  Initial Outpatient Consultation  Name: [name] MRN: [id number]  Date: 11/19/2021  DOB: 1950-05-29  CX:KGYJE Chase, Alferd Apa, DO  Ennever, Rudell Cobb, MD   REFERRING PHYSICIAN: [name], MD  DIAGNOSIS: There were no encounter diagnoses.  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 female who is accompanied by ***. she is seen as a courtesy of Dr. Marin Olp for an opinion concerning radiation therapy as part of management for her recently diagnosed extensive stage small cell left lung cancer. This past January, the patient developed severe lower back pain with radiation to her leg. This prompted an MRI of the lumbar spine on 05/02/21 which revealed multiple new hypointense bone lesions involving the T10, L1, L2, and L3 vertebral bodies with involvement of posterior elements on the left at L3 and on the right at T10. Findings were noted as highly concerning for malignancy. (To preface this, the patient already had an extensive history of back issues, including 6 back surgeries in the past).   CT of the chest abdomen and pelvis on 05/14/21 further revealed: a large left perihilar mass measuring 4.5 x 3.9 cm with extensive mediastinal adenopathy; some pleural nodularity on the left; bony metastatic disease in the thoracic and lumbar spine; an AP window node measuring 1.9 cm; a right paratracheal lymph node measured 3.1 cm.  Otherwise, CT showed no evidence of  obvious hepatic metastasis or adrenal metastasis, however signs of hepatic steatosis were appreciable.  Accordingly, the patient was referred to Dr. Marin Olp on 05/23/21 for further management. During this visit, the patient endorsed decreased appetite, a 15 pound unintentional weight loss, and a 50 pack year smoking history. She denied any respiratory symptoms. In terms of treatment options, Dr. Marin Olp recommended proceeding with bronchoscopy, xgeva, and a referral to radiation.   The patient was then referred to pulmonology and proceeded to undergo bronchoscopy with biopsies on 06/02/21 under the care of Dr. Lamonte Sakai. Pathology from the procedure revealed: malignant cells consistent with small cell carcinoma from biopsies of the LLL and FNA of lymph nodes 4R, 7, and 10L.   PET scan on 05/27/21 showed a large left upper lobe/hilar mass invading the mediastinum likely correlating with the biopsy proven primary lung neoplasm. PET also showed: associated large hypermetabolic prevascular, subcarinal and contralateral right mediastinal lymphadenopathy; left-sided pleural metastatic nodules; 2 hypermetabolic hepatic metastatic lesions; a soft tissue nodule along the superior margin of the pancreas with an SUV max of 34.50. PET also redemonstrated the widespread aggressive/lytic destructive metastatic bone disease.   MRI of the brain on 06/13/21 showed no evidence of intracranial metastatic disease.  Accordingly, the patient proceeded with systemic treatment. She has completed chemotherapy consisting of Carbo/VP-16/Tecentriq x 6 cycles from 06/11/21 through 10/06/21 under Dr. Marin Olp. She also began Xgeva 06/11/21 (q 3 months). The patient tolerated chemotherapy relatively well other than fatigue, taste changes, mild constipation, mouth ulcers, sore throat, mild SOB on exertion. Given discomfort associated with mouth sores and fatigue, cycle 4  of her treatment was held for 1 week.   Restaging PET scan on 07/24/21  demonstrated a marked interval response to therapy, demonstrated by: a substantial decrease in size and hypermetabolism of the soft tissue disease in the mediastinum and left hilum, resolution of tiny hypermetabolic subpleural nodules in the left lower lobe, no hypermetabolic uptake above background marrow activity of the persistent bony lytic lesions, interval resolution of hypermetabolic liver metastases, and a substantial decrease of the hypermetabolic soft tissue nodule along the superior aspect of the pancreas. PET also showed no evidence of new hypermetabolic disease elsewhere.   Restaging PET scan on 11/03/21 showed a slight increase in size and FDG uptake of the soft tissue of the left hilum, and a increased sclerosis of the T10 vertebral lesion. In contrast, an interval decrease in size and metabolic activity of right lower paratracheal lymph node was appreciated. Otherwise, other sites of osseous metastases were seen to remain unchanged in the interval, and no evidence of FDG avid disease in the abdomen or pelvis were appreciated.   The patient most recently followed up with Dr. Marin Olp on 11/14/21. During which time, Dr. Marin Olp discussed the role of consolidative radiation therapy followed by maintenance immunotherapy. The patient was noted to endorse ongoing issues with back pain despite no evidence of active disease in her back on restaging PET. Given so, Dr. Marin Olp discussed adding long-acting pain medication to her regimen (consisting of only percocet), possibly consisting of Xtampza.   Repeat MRI of the brain on 11/10/21 again showed no evidence of intracranial metastatic disease or other acute findings.    PREVIOUS RADIATION THERAPY: No  PAST MEDICAL HISTORY:  Past Medical History:  Diagnosis Date   Arthritis    Cancer ([place])    Diabetes mellitus    Esophagitis    Goals of care, counseling/discussion 05/23/2021   Hiatal hernia    Hx of colonic polyps ssp and adenoma 03/06/2018    Hypertension    Hypothyroidism    OSA on CPAP    Sleep apnea    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[place]) 06/06/2021   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[place]) 06/06/2021   Small cell lung cancer, left upper lobe ([place]) 06/06/2021   Thyroid disease    TMJ (sprain of temporomandibular joint)     PAST SURGICAL HISTORY: Past Surgical History:  Procedure Laterality Date   ABDOMINAL HYSTERECTOMY     BACK SURGERY     x3   BACK SURGERY     x 5 , 2 neck surgeries, 2 lower back surgeries   BRONCHIAL BIOPSY  06/02/2021   Procedure: BRONCHIAL BIOPSIES;  Surgeon: Collene Gobble, MD;  Location: [place];  Service: Pulmonary;;   BRONCHIAL BRUSHINGS  06/02/2021   Procedure: BRONCHIAL BRUSHINGS;  Surgeon: Collene Gobble, MD;  Location: [facility] ENDOSCOPY;  Service: Pulmonary;;   BRONCHIAL NEEDLE ASPIRATION BIOPSY  06/02/2021   Procedure: BRONCHIAL NEEDLE ASPIRATION BIOPSIES;  Surgeon: Collene Gobble, MD;  Location: [facility] ENDOSCOPY;  Service: Pulmonary;;   CHOLECYSTECTOMY     COLONOSCOPY     ERCP     ESOPHAGOGASTRODUODENOSCOPY     IR IMAGING GUIDED PORT INSERTION  06/13/2021   THYROID SURGERY     TONSILECTOMY, ADENOIDECTOMY, BILATERAL MYRINGOTOMY AND TUBES     VIDEO BRONCHOSCOPY WITH ENDOBRONCHIAL ULTRASOUND Left 06/02/2021   Procedure: VIDEO BRONCHOSCOPY WITH ENDOBRONCHIAL ULTRASOUND;  Surgeon: Collene Gobble, MD;  Location: [facility] ENDOSCOPY;  Service: Pulmonary;  Laterality: Left;    FAMILY HISTORY:  Family History  Problem Relation Age of Onset   Stroke Mother    Alcohol abuse Mother    Stroke Maternal Grandmother    Heart disease Maternal Grandfather    Breast cancer Cousin    Breast cancer Daughter    Colon cancer Neg Hx    Esophageal cancer Neg Hx    Rectal cancer Neg Hx    Stomach cancer Neg Hx     SOCIAL HISTORY:  Social History   Tobacco Use   Smoking status: Former    Years: 48.00    Types: Cigarettes    Quit date: 02/01/2017     Years since quitting: 4.7   Smokeless tobacco: Never  Vaping Use   Vaping Use: Never used  Substance Use Topics   Alcohol use: Never   Drug use: No    ALLERGIES:  Allergies  Allergen Reactions   Hydrocodone-Acetaminophen Other (See Comments)    Nightmares, skin crawling   Lyrica [Pregabalin] Other (See Comments)    Hallucinations    Crestor [Rosuvastatin Calcium] Other (See Comments)    Leg cramps    MEDICATIONS:  Current Outpatient Medications  Medication Sig Dispense Refill   acetaminophen (TYLENOL) 500 MG tablet Take 1,000 mg by mouth every 6 (six) hours as needed for moderate pain. (Patient not taking: Reported on 11/14/2021)     albuterol (VENTOLIN HFA) 108 (90 Base) MCG/ACT inhaler Inhale 2 puffs into the lungs every 6 (six) hours as needed for wheezing or shortness of breath. (Patient not taking: Reported on 11/14/2021) 8 g 2   ALPRAZolam (XANAX) 0.5 MG tablet TAKE ONE TABLET BY MOUTH THREE TIMES A DAY AS NEEDED 90 tablet 0   amoxicillin (AMOXIL) 500 MG tablet Take one hour before dental procedure. (Patient not taking: Reported on 11/14/2021) 4 tablet 3   blood glucose meter kit and supplies KIT Use daily 1 each 0   Blood Glucose Monitoring Suppl (ONE TOUCH ULTRA 2) w/Device KIT Check blood sugar as directed. Dx:E11.65 1 kit 0   Blood Glucose Monitoring Suppl (ONETOUCH VERIO FLEX SYSTEM) w/Device KIT USE AS DIRECTED TO CHECK BLOOD SUGAR FOUR TIMES A DAY 1 kit 0   chlorhexidine (PERIDEX) 0.12 % solution Use as directed 15 mLs in the mouth or throat 2 (two) times daily. 473 mL 1   fentaNYL (DURAGESIC) 12 MCG/HR Place 1 patch onto the skin every 3 (three) days. 10 patch 0   FLUoxetine (PROZAC) 20 MG capsule Take 1 capsule (20 mg total) by mouth daily. 90 capsule 3   glucose blood (ONETOUCH ULTRA) test strip USE TO CHECK BLOOD SUGAR FOUR TIMES A DAY AS DIRECTED 400 strip 1   insulin glargine (LANTUS SOLOSTAR) 100 UNIT/ML Solostar Pen Inject 30 Units into the skin daily. HS      Insulin Pen Needle (NOVOTWIST) 32G X 5 MM MISC Use daily at Bedtime with Lantus 100 each 0   levothyroxine (SYNTHROID) 125 MCG tablet Take 1 tablet (125 mcg total) by mouth daily before breakfast. 90 tablet 3   meloxicam (MOBIC) 15 MG tablet Take 1 tablet (15 mg total) by mouth daily. 90 tablet 0   metFORMIN (GLUCOPHAGE-XR) 500 MG 24 hr tablet Take 1 tablet (500 mg total) by mouth in the morning and at bedtime. 180 tablet 1   omeprazole (PRILOSEC) 20 MG capsule Take 1 capsule (20 mg total) by mouth daily. 90 capsule 3   oxyCODONE ER (XTAMPZA ER) 13.5 MG C12A Take 13.5 mg  by mouth 2 (two) times daily. 60 capsule 0   oxyCODONE-acetaminophen (PERCOCET) 7.5-325 MG tablet Take 1 tablet by mouth every 6 (six) hours as needed for severe pain. 90 tablet 0   TRULICITY 4.78 GN/5.6OZ SOPN Inject 0.75 mg into the skin every Saturday.     UNABLE TO FIND Please provide hair prothesis for patient undergoing chemotherapy for cancer diagnosis. 1 Units 0   valACYclovir (VALTREX) 1000 MG tablet TAKE ONE TABLET BY MOUTH DAILY AS NEEDED 30 tablet 0   Current Facility-Administered Medications  Medication Dose Route Frequency Provider Last Rate Last Admin   ipratropium-albuterol (DUONEB) 0.5-2.5 (3) MG/3ML nebulizer solution 3 mL  3 mL Nebulization Q6H Lowne Chase, Kendrick Fries R, DO       Facility-Administered Medications Ordered in Other Encounters  Medication Dose Route Frequency Provider Last Rate Last Admin   sodium chloride flush (NS) 0.9 % injection 10 mL  10 mL Intravenous PRN Celso Amy, NP   10 mL at 08/18/21 3086    REVIEW OF SYSTEMS:  A 10+ POINT REVIEW OF SYSTEMS WAS OBTAINED including neurology, dermatology, psychiatry, cardiac, respiratory, lymph, extremities, GI, GU, musculoskeletal, constitutional, reproductive, HEENT. ***   PHYSICAL EXAM:  vitals were not taken for this visit.   General: Alert and oriented, in no acute distress HEENT: Head is normocephalic. Extraocular movements are intact.  Oropharynx is clear. Neck: Neck is supple, no palpable cervical or supraclavicular lymphadenopathy. Heart: Regular in rate and rhythm with no murmurs, rubs, or gallops. Chest: Clear to auscultation bilaterally, with no rhonchi, wheezes, or rales. Abdomen: Soft, nontender, nondistended, with no rigidity or guarding. Extremities: No cyanosis or edema. Lymphatics: see Neck Exam Skin: No concerning lesions. Musculoskeletal: symmetric strength and muscle tone throughout. Neurologic: Cranial nerves II through XII are grossly intact. No obvious focalities. Speech is fluent. Coordination is intact. Psychiatric: Judgment and insight are intact. Affect is appropriate. ***  ECOG = ***  0 - Asymptomatic (Fully active, able to carry on all predisease activities without restriction)  1 - Symptomatic but completely ambulatory (Restricted in physically strenuous activity but ambulatory and able to carry out work of a light or sedentary nature. For example, light housework, office work)  2 - Symptomatic, <50% in bed during the day (Ambulatory and capable of all self care but unable to carry out any work activities. Up and about more than 50% of waking hours)  3 - Symptomatic, >50% in bed, but not bedbound (Capable of only limited self-care, confined to bed or chair 50% or more of waking hours)  4 - Bedbound (Completely disabled. Cannot carry on any self-care. Totally confined to bed or chair)  5 - Death   Eustace Pen MM, Creech RH, Tormey DC, et al. [phone]). "Toxicity and response criteria of the [facility]". [place] Oncol. 5 (6): 649-55  LABORATORY DATA:  Lab Results  Component Value Date   WBC 8.4 11/14/2021   HGB 11.0 (L) 11/14/2021   HCT 33.9 (L) 11/14/2021   MCV 99.4 11/14/2021   PLT 196 11/14/2021   NEUTROABS 6.7 11/14/2021   Lab Results  Component Value Date   NA 139 11/14/2021   K 3.9 11/14/2021   CL 103 11/14/2021   CO2 28 11/14/2021   GLUCOSE 98 11/14/2021    BUN 16 11/14/2021   CREATININE 0.68 11/14/2021   CALCIUM 9.7 11/14/2021      RADIOGRAPHY: MR Brain W Wo Contrast  Result Date: 11/10/2021 CLINICAL DATA:  Non-small cell lung cancer, completed chemotherapy,  evaluate for brain Mets EXAM: MRI HEAD WITHOUT AND WITH CONTRAST TECHNIQUE: Multiplanar, multiecho pulse sequences of the brain and surrounding structures were obtained without and with intravenous contrast. CONTRAST:  83mL GADAVIST GADOBUTROL 1 MMOL/ML IV SOLN COMPARISON:  06/13/2021 FINDINGS: Brain: No abnormal parenchymal or meningeal enhancement. No restricted diffusion to suggest acute or subacute infarcts. No acute hemorrhage, mass, mass effect, or midline shift. No hydrocephalus or extra-axial collection. Lacunar infarcts in the left centrum semiovale and left cerebellum. Scattered T2 hyperintense signal in the periventricular white matter, likely the sequela of mild-to-moderate chronic small vessel ischemic disease. Vascular: Normal arterial flow voids. Normal arterial and venous enhancement. Skull and upper cervical spine: Normal marrow signal. No abnormal enhancement. ACDF extending from C4 off the inferior edge of the field-of-view. Sinuses/Orbits: Mucosal thickening in the ethmoid air cells. The orbits are unremarkable. Other: Trace fluid in the right mastoid air cells. IMPRESSION: No acute intracranial process. No evidence of metastatic disease in the brain. Electronically Signed   By: Merilyn Baba M.D.   On: 11/10/2021 20:17   NM PET Image Restag (PS) Skull Base To Thigh  Result Date: 11/03/2021 CLINICAL DATA:  Follow-up treatment strategy for small cell lung cancer. EXAM: NUCLEAR MEDICINE PET SKULL BASE TO THIGH TECHNIQUE: 6.1 mCi F-18 FDG was injected intravenously. Full-ring PET imaging was performed from the skull base to thigh after the radiotracer. CT data was obtained and used for attenuation correction and anatomic localization. Fasting blood glucose: 132 mg/dl COMPARISON:  None  Available. FINDINGS: Mediastinal blood pool activity: SUV max 2.3 Liver activity: SUV max 3.8 NECK: No hypermetabolic lymph nodes in the neck. Incidental CT findings: none CHEST: Left mediastinal soft tissue measuring 3.3 x 2.5 cm on series 4, image 68, with SUV max of 38, previously measured 3.3 x 2.3 cm on prior PET-CT with SUV max of 15. Hypermetabolic right lower paratracheal lymph node measuring 10 mm on series 4, image 55 with SUV max of 3.9, previously measured 1.5 cm in short axis with SUV max of 6. Incidental CT findings: Atherosclerotic disease of the thoracic aorta. Small hiatal hernia. ABDOMEN/PELVIS: No abnormal hypermetabolic activity within the liver, pancreas, adrenal glands, or spleen. No hypermetabolic lymph nodes in the abdomen or pelvis. Incidental CT findings: Cholecystectomy clips. Diverticulosis. Atherosclerotic disease of the abdominal aorta. SKELETON: Numerous lytic and sclerotic lesions again seen, T10 vertebral body lesion demonstrates increased sclerosis when compared with prior exam, otherwise findings are similar. No significant focal osseous FDG uptake. Incidental CT findings: Orthopedic hardware of the cervical and lumbar spine. IMPRESSION: 1. Soft tissue of the left hilum is slightly increased in size and demonstrates increased FDG uptake. 2. Interval decreased size and metabolic activity of right lower paratracheal lymph node. 3. No evidence of FDG avid disease in the abdomen or pelvis. 4. Numerous lytic and sclerotic lesions again seen, T10 vertebral body lesion demonstrates increased sclerosis when compared with prior exam, otherwise findings are unchanged. No significant focal osseous FDG uptake. Electronically Signed   By: Yetta Glassman M.D.   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  We discussed the natural history of *** and general treatment, highlighting the role of radiotherapy in the  management.  We discussed the available radiation techniques, and focused on the details of logistics and delivery.  We reviewed the anticipated acute and late sequelae associated with radiation in this setting.  The patient was encouraged to ask questions that I answered to the best of my ability. *** A patient consent form was discussed and signed.  We retained a copy for our records.  The patient would like to proceed with radiation and will be scheduled for CT simulation.  PLAN: ***    *** minutes of total time was spent for this patient encounter, including preparation, face-to-face counseling with the patient and coordination of care, physical exam, and documentation of the encounter.   ------------------------------------------------  Blair Promise, PhD, MD  This document serves as a record of services personally performed by Gery Pray, MD. It was created on his behalf by Roney Mans, a trained medical scribe. The creation of this record is based on the scribe's personal observations and the provider's statements to them. This document has been checked and approved by the attending provider.

## 2021-11-19 ENCOUNTER — Encounter: Payer: Self-pay | Admitting: *Deleted

## 2021-11-19 ENCOUNTER — Encounter: Payer: Self-pay | Admitting: Radiation Oncology

## 2021-11-19 ENCOUNTER — Ambulatory Visit
Admission: RE | Admit: 2021-11-19 | Discharge: 2021-11-19 | Disposition: A | Discharge status: Home / Self Care | Payer: PPO | Source: Ambulatory Visit | Attending: Radiation Oncology | Admitting: Radiation Oncology

## 2021-11-19 ENCOUNTER — Encounter: Payer: Self-pay | Admitting: Hematology & Oncology

## 2021-11-19 ENCOUNTER — Other Ambulatory Visit: Payer: Self-pay

## 2021-11-19 VITALS — BP 127/91 | HR 92 | Temp 98.2°F | Resp 20 | Ht 59.5 in | Wt 123.2 lb

## 2021-11-19 DIAGNOSIS — Z7984 Long term (current) use of oral hypoglycemic drugs: Secondary | ICD-10-CM | POA: Diagnosis not present

## 2021-11-19 DIAGNOSIS — C787 Secondary malignant neoplasm of liver and intrahepatic bile duct: Secondary | ICD-10-CM | POA: Insufficient documentation

## 2021-11-19 DIAGNOSIS — E039 Hypothyroidism, unspecified: Secondary | ICD-10-CM | POA: Diagnosis not present

## 2021-11-19 DIAGNOSIS — E119 Type 2 diabetes mellitus without complications: Secondary | ICD-10-CM | POA: Diagnosis not present

## 2021-11-19 DIAGNOSIS — Z86010 Personal history of colonic polyps: Secondary | ICD-10-CM | POA: Insufficient documentation

## 2021-11-19 DIAGNOSIS — Z794 Long term (current) use of insulin: Secondary | ICD-10-CM | POA: Insufficient documentation

## 2021-11-19 DIAGNOSIS — M129 Arthropathy, unspecified: Secondary | ICD-10-CM | POA: Diagnosis not present

## 2021-11-19 DIAGNOSIS — Z803 Family history of malignant neoplasm of breast: Secondary | ICD-10-CM | POA: Insufficient documentation

## 2021-11-19 DIAGNOSIS — K449 Diaphragmatic hernia without obstruction or gangrene: Secondary | ICD-10-CM | POA: Insufficient documentation

## 2021-11-19 DIAGNOSIS — I6381 Other cerebral infarction due to occlusion or stenosis of small artery: Secondary | ICD-10-CM | POA: Diagnosis not present

## 2021-11-19 DIAGNOSIS — Z791 Long term (current) use of non-steroidal anti-inflammatories (NSAID): Secondary | ICD-10-CM | POA: Insufficient documentation

## 2021-11-19 DIAGNOSIS — C7951 Secondary malignant neoplasm of bone: Secondary | ICD-10-CM | POA: Diagnosis not present

## 2021-11-19 DIAGNOSIS — C7889 Secondary malignant neoplasm of other digestive organs: Secondary | ICD-10-CM | POA: Insufficient documentation

## 2021-11-19 DIAGNOSIS — Z7985 Long-term (current) use of injectable non-insulin antidiabetic drugs: Secondary | ICD-10-CM | POA: Diagnosis not present

## 2021-11-19 DIAGNOSIS — I10 Essential (primary) hypertension: Secondary | ICD-10-CM | POA: Diagnosis not present

## 2021-11-19 DIAGNOSIS — C3412 Malignant neoplasm of upper lobe, left bronchus or lung: Secondary | ICD-10-CM | POA: Diagnosis not present

## 2021-11-19 DIAGNOSIS — Z806 Family history of leukemia: Secondary | ICD-10-CM | POA: Insufficient documentation

## 2021-11-19 DIAGNOSIS — Z79899 Other long term (current) drug therapy: Secondary | ICD-10-CM | POA: Insufficient documentation

## 2021-11-19 DIAGNOSIS — C3490 Malignant neoplasm of unspecified part of unspecified bronchus or lung: Secondary | ICD-10-CM

## 2021-11-19 DIAGNOSIS — Z87891 Personal history of nicotine dependence: Secondary | ICD-10-CM | POA: Diagnosis not present

## 2021-11-19 MED ORDER — OXYCODONE-ACETAMINOPHEN 7.5-325 MG PO TABS: 1.0000 | ORAL_TABLET | Freq: Four times a day (QID) | ORAL | 0 refills | Status: DC | PRN

## 2021-11-19 NOTE — Progress Notes (Signed)
Patient seen by [facility] today and will begin therapy in about one week.   Oncology Nurse Navigator Documentation     11/19/2021   12:45 PM  Oncology Nurse Navigator Flowsheets  Planned Course of Treatment Radiation  Navigator Follow Up Date: 12/22/2021  Navigator Follow Up Reason: Follow-up Appointment  Navigator Location [facility]  Navigator Encounter Type Appt/Treatment Plan Review  Patient Visit Type MedOnc  Treatment Phase Active Tx  Barriers/Navigation Needs Coordination of Care;Education  Interventions None Required  Acuity Level 2-Minimal Needs (1-2 Barriers Identified)  Support Groups/Services Friends and Family  Time Spent with Patient 15

## 2021-11-20 ENCOUNTER — Ambulatory Visit
Admission: RE | Admit: 2021-11-20 | Discharge: 2021-11-20 | Disposition: A | Discharge status: Home / Self Care | Payer: PPO | Source: Ambulatory Visit | Attending: Radiation Oncology | Admitting: Radiation Oncology

## 2021-11-20 DIAGNOSIS — Z510 Encounter for antineoplastic radiation therapy: Secondary | ICD-10-CM | POA: Insufficient documentation

## 2021-11-20 DIAGNOSIS — C3412 Malignant neoplasm of upper lobe, left bronchus or lung: Secondary | ICD-10-CM | POA: Diagnosis not present

## 2021-11-20 DIAGNOSIS — Z87891 Personal history of nicotine dependence: Secondary | ICD-10-CM | POA: Diagnosis not present

## 2021-11-25 DIAGNOSIS — Z87891 Personal history of nicotine dependence: Secondary | ICD-10-CM | POA: Diagnosis not present

## 2021-11-25 DIAGNOSIS — C3412 Malignant neoplasm of upper lobe, left bronchus or lung: Secondary | ICD-10-CM | POA: Diagnosis not present

## 2021-11-25 DIAGNOSIS — Z510 Encounter for antineoplastic radiation therapy: Secondary | ICD-10-CM | POA: Diagnosis not present

## 2021-11-27 ENCOUNTER — Other Ambulatory Visit: Payer: Self-pay

## 2021-11-27 ENCOUNTER — Ambulatory Visit
Admission: RE | Admit: 2021-11-27 | Discharge: 2021-11-27 | Disposition: A | Discharge status: Home / Self Care | Payer: PPO | Source: Ambulatory Visit | Attending: Radiation Oncology | Admitting: Radiation Oncology

## 2021-11-27 DIAGNOSIS — Z510 Encounter for antineoplastic radiation therapy: Secondary | ICD-10-CM | POA: Diagnosis not present

## 2021-11-27 DIAGNOSIS — C3412 Malignant neoplasm of upper lobe, left bronchus or lung: Secondary | ICD-10-CM

## 2021-11-27 DIAGNOSIS — Z87891 Personal history of nicotine dependence: Secondary | ICD-10-CM | POA: Diagnosis not present

## 2021-11-27 LAB — RAD ONC ARIA SESSION SUMMARY
Course Elapsed Days: 0
Plan Fractions Treated to Date: 1
Plan Prescribed Dose Per Fraction: 2 Gy
Plan Total Fractions Prescribed: 30
Plan Total Prescribed Dose: 60 Gy
Reference Point Dosage Given to Date: 2 Gy
Reference Point Session Dosage Given: 2 Gy
Session Number: 1

## 2021-11-28 ENCOUNTER — Ambulatory Visit
Admission: RE | Admit: 2021-11-28 | Discharge: 2021-11-28 | Disposition: A | Discharge status: Home / Self Care | Payer: PPO | Source: Ambulatory Visit | Attending: Radiation Oncology | Admitting: Radiation Oncology

## 2021-11-28 ENCOUNTER — Other Ambulatory Visit: Payer: Self-pay

## 2021-11-28 DIAGNOSIS — C3412 Malignant neoplasm of upper lobe, left bronchus or lung: Secondary | ICD-10-CM | POA: Diagnosis not present

## 2021-11-28 DIAGNOSIS — Z510 Encounter for antineoplastic radiation therapy: Secondary | ICD-10-CM | POA: Diagnosis not present

## 2021-11-28 DIAGNOSIS — Z87891 Personal history of nicotine dependence: Secondary | ICD-10-CM | POA: Diagnosis not present

## 2021-11-28 LAB — RAD ONC ARIA SESSION SUMMARY
Course Elapsed Days: 1
Plan Fractions Treated to Date: 2
Plan Prescribed Dose Per Fraction: 2 Gy
Plan Total Fractions Prescribed: 30
Plan Total Prescribed Dose: 60 Gy
Reference Point Dosage Given to Date: 4 Gy
Reference Point Session Dosage Given: 2 Gy
Session Number: 2

## 2021-12-01 ENCOUNTER — Other Ambulatory Visit: Payer: Self-pay

## 2021-12-01 ENCOUNTER — Ambulatory Visit
Admission: RE | Admit: 2021-12-01 | Discharge: 2021-12-01 | Disposition: A | Discharge status: Home / Self Care | Payer: PPO | Source: Ambulatory Visit | Attending: Radiation Oncology | Admitting: Radiation Oncology

## 2021-12-01 DIAGNOSIS — Z510 Encounter for antineoplastic radiation therapy: Secondary | ICD-10-CM | POA: Diagnosis not present

## 2021-12-01 DIAGNOSIS — Z87891 Personal history of nicotine dependence: Secondary | ICD-10-CM | POA: Diagnosis not present

## 2021-12-01 DIAGNOSIS — C3412 Malignant neoplasm of upper lobe, left bronchus or lung: Secondary | ICD-10-CM | POA: Diagnosis not present

## 2021-12-01 LAB — RAD ONC ARIA SESSION SUMMARY
Course Elapsed Days: 4
Plan Fractions Treated to Date: 3
Plan Prescribed Dose Per Fraction: 2 Gy
Plan Total Fractions Prescribed: 30
Plan Total Prescribed Dose: 60 Gy
Reference Point Dosage Given to Date: 6 Gy
Reference Point Session Dosage Given: 2 Gy
Session Number: 3

## 2021-12-02 ENCOUNTER — Ambulatory Visit
Admission: RE | Admit: 2021-12-02 | Discharge: 2021-12-02 | Disposition: A | Discharge status: Home / Self Care | Payer: PPO | Source: Ambulatory Visit | Attending: Radiation Oncology | Admitting: Radiation Oncology

## 2021-12-02 ENCOUNTER — Other Ambulatory Visit: Payer: Self-pay

## 2021-12-02 ENCOUNTER — Ambulatory Visit: Payer: PPO

## 2021-12-02 DIAGNOSIS — Z87891 Personal history of nicotine dependence: Secondary | ICD-10-CM | POA: Diagnosis not present

## 2021-12-02 DIAGNOSIS — Z510 Encounter for antineoplastic radiation therapy: Secondary | ICD-10-CM | POA: Diagnosis not present

## 2021-12-02 DIAGNOSIS — C778 Secondary and unspecified malignant neoplasm of lymph nodes of multiple regions: Secondary | ICD-10-CM

## 2021-12-02 DIAGNOSIS — C3412 Malignant neoplasm of upper lobe, left bronchus or lung: Secondary | ICD-10-CM | POA: Diagnosis not present

## 2021-12-02 LAB — RAD ONC ARIA SESSION SUMMARY
Course Elapsed Days: 5
Plan Fractions Treated to Date: 4
Plan Prescribed Dose Per Fraction: 2 Gy
Plan Total Fractions Prescribed: 30
Plan Total Prescribed Dose: 60 Gy
Reference Point Dosage Given to Date: 8 Gy
Reference Point Session Dosage Given: 2 Gy
Session Number: 4

## 2021-12-02 MED ORDER — SONAFINE EX EMUL
1.0000 | Freq: Once | CUTANEOUS | Status: AC
  Administered 2021-12-02: 1 via TOPICAL

## 2021-12-02 NOTE — Progress Notes (Signed)
Pt here for patient teaching.    Pt given Radiation and You booklet, skin care instructions, and Sonafine.    Reviewed areas of pertinence such as fatigue, hair loss in treatment field, skin changes, throat changes, cough, and shortness of breath .   Pt able to give teach back of to pat skin, use unscented/gentle soap, and drink plenty of water,apply Sonafine bid and avoid applying anything to skin within 4 hours of treatment.   Pt verbalizes understanding of information given and will contact nursing with any questions or concerns.

## 2021-12-03 ENCOUNTER — Ambulatory Visit
Admission: RE | Admit: 2021-12-03 | Discharge: 2021-12-03 | Disposition: A | Discharge status: Home / Self Care | Payer: PPO | Source: Ambulatory Visit | Attending: Radiation Oncology | Admitting: Radiation Oncology

## 2021-12-03 ENCOUNTER — Other Ambulatory Visit: Payer: Self-pay

## 2021-12-03 DIAGNOSIS — Z87891 Personal history of nicotine dependence: Secondary | ICD-10-CM | POA: Diagnosis not present

## 2021-12-03 DIAGNOSIS — Z510 Encounter for antineoplastic radiation therapy: Secondary | ICD-10-CM | POA: Diagnosis not present

## 2021-12-03 DIAGNOSIS — C3412 Malignant neoplasm of upper lobe, left bronchus or lung: Secondary | ICD-10-CM | POA: Diagnosis not present

## 2021-12-03 LAB — RAD ONC ARIA SESSION SUMMARY
Course Elapsed Days: 6
Plan Fractions Treated to Date: 5
Plan Prescribed Dose Per Fraction: 2 Gy
Plan Total Fractions Prescribed: 30
Plan Total Prescribed Dose: 60 Gy
Reference Point Dosage Given to Date: 10 Gy
Reference Point Session Dosage Given: 2 Gy
Session Number: 5

## 2021-12-04 ENCOUNTER — Ambulatory Visit: Payer: PPO

## 2021-12-04 ENCOUNTER — Other Ambulatory Visit: Payer: Self-pay

## 2021-12-04 ENCOUNTER — Ambulatory Visit
Admission: RE | Admit: 2021-12-04 | Discharge: 2021-12-04 | Disposition: A | Discharge status: Home / Self Care | Payer: PPO | Source: Ambulatory Visit | Attending: Radiation Oncology | Admitting: Radiation Oncology

## 2021-12-04 DIAGNOSIS — Z87891 Personal history of nicotine dependence: Secondary | ICD-10-CM | POA: Diagnosis not present

## 2021-12-04 DIAGNOSIS — C3412 Malignant neoplasm of upper lobe, left bronchus or lung: Secondary | ICD-10-CM | POA: Diagnosis not present

## 2021-12-04 DIAGNOSIS — Z510 Encounter for antineoplastic radiation therapy: Secondary | ICD-10-CM | POA: Diagnosis not present

## 2021-12-04 LAB — RAD ONC ARIA SESSION SUMMARY
Course Elapsed Days: 7
Plan Fractions Treated to Date: 6
Plan Prescribed Dose Per Fraction: 2 Gy
Plan Total Fractions Prescribed: 30
Plan Total Prescribed Dose: 60 Gy
Reference Point Dosage Given to Date: 12 Gy
Reference Point Session Dosage Given: 2 Gy
Session Number: 6

## 2021-12-05 ENCOUNTER — Other Ambulatory Visit: Payer: Self-pay

## 2021-12-05 ENCOUNTER — Ambulatory Visit
Admission: RE | Admit: 2021-12-05 | Discharge: 2021-12-05 | Disposition: A | Discharge status: Home / Self Care | Payer: PPO | Source: Ambulatory Visit | Attending: Radiation Oncology | Admitting: Radiation Oncology

## 2021-12-05 DIAGNOSIS — Z510 Encounter for antineoplastic radiation therapy: Secondary | ICD-10-CM | POA: Diagnosis not present

## 2021-12-05 DIAGNOSIS — Z87891 Personal history of nicotine dependence: Secondary | ICD-10-CM | POA: Diagnosis not present

## 2021-12-05 DIAGNOSIS — C778 Secondary and unspecified malignant neoplasm of lymph nodes of multiple regions: Secondary | ICD-10-CM | POA: Diagnosis not present

## 2021-12-05 DIAGNOSIS — C3490 Malignant neoplasm of unspecified part of unspecified bronchus or lung: Secondary | ICD-10-CM | POA: Diagnosis not present

## 2021-12-05 DIAGNOSIS — C3412 Malignant neoplasm of upper lobe, left bronchus or lung: Secondary | ICD-10-CM | POA: Diagnosis not present

## 2021-12-05 LAB — RAD ONC ARIA SESSION SUMMARY
Course Elapsed Days: 8
Plan Fractions Treated to Date: 7
Plan Prescribed Dose Per Fraction: 2 Gy
Plan Total Fractions Prescribed: 30
Plan Total Prescribed Dose: 60 Gy
Reference Point Dosage Given to Date: 14 Gy
Reference Point Session Dosage Given: 2 Gy
Session Number: 7

## 2021-12-09 ENCOUNTER — Other Ambulatory Visit: Payer: Self-pay

## 2021-12-09 ENCOUNTER — Ambulatory Visit
Admission: RE | Admit: 2021-12-09 | Discharge: 2021-12-09 | Disposition: A | Discharge status: Home / Self Care | Payer: PPO | Source: Ambulatory Visit | Attending: Radiation Oncology | Admitting: Radiation Oncology

## 2021-12-09 DIAGNOSIS — Z510 Encounter for antineoplastic radiation therapy: Secondary | ICD-10-CM | POA: Diagnosis not present

## 2021-12-09 DIAGNOSIS — Z87891 Personal history of nicotine dependence: Secondary | ICD-10-CM | POA: Diagnosis not present

## 2021-12-09 DIAGNOSIS — C3490 Malignant neoplasm of unspecified part of unspecified bronchus or lung: Secondary | ICD-10-CM | POA: Diagnosis not present

## 2021-12-09 DIAGNOSIS — C3412 Malignant neoplasm of upper lobe, left bronchus or lung: Secondary | ICD-10-CM | POA: Diagnosis not present

## 2021-12-09 LAB — RAD ONC ARIA SESSION SUMMARY
Course Elapsed Days: 12
Plan Fractions Treated to Date: 8
Plan Prescribed Dose Per Fraction: 2 Gy
Plan Total Fractions Prescribed: 30
Plan Total Prescribed Dose: 60 Gy
Reference Point Dosage Given to Date: 16 Gy
Reference Point Session Dosage Given: 2 Gy
Session Number: 8

## 2021-12-10 ENCOUNTER — Ambulatory Visit
Admission: RE | Admit: 2021-12-10 | Discharge: 2021-12-10 | Disposition: A | Discharge status: Home / Self Care | Payer: PPO | Source: Ambulatory Visit | Attending: Radiation Oncology | Admitting: Radiation Oncology

## 2021-12-10 ENCOUNTER — Other Ambulatory Visit: Payer: Self-pay

## 2021-12-10 DIAGNOSIS — Z87891 Personal history of nicotine dependence: Secondary | ICD-10-CM | POA: Diagnosis not present

## 2021-12-10 DIAGNOSIS — Z510 Encounter for antineoplastic radiation therapy: Secondary | ICD-10-CM | POA: Diagnosis not present

## 2021-12-10 DIAGNOSIS — C3490 Malignant neoplasm of unspecified part of unspecified bronchus or lung: Secondary | ICD-10-CM | POA: Diagnosis not present

## 2021-12-10 DIAGNOSIS — C3412 Malignant neoplasm of upper lobe, left bronchus or lung: Secondary | ICD-10-CM | POA: Diagnosis not present

## 2021-12-10 LAB — RAD ONC ARIA SESSION SUMMARY
Course Elapsed Days: 13
Plan Fractions Treated to Date: 9
Plan Prescribed Dose Per Fraction: 2 Gy
Plan Total Fractions Prescribed: 30
Plan Total Prescribed Dose: 60 Gy
Reference Point Dosage Given to Date: 18 Gy
Reference Point Session Dosage Given: 2 Gy
Session Number: 9

## 2021-12-11 ENCOUNTER — Other Ambulatory Visit: Payer: Self-pay

## 2021-12-11 ENCOUNTER — Other Ambulatory Visit: Payer: Self-pay | Admitting: *Deleted

## 2021-12-11 ENCOUNTER — Ambulatory Visit
Admission: RE | Admit: 2021-12-11 | Discharge: 2021-12-11 | Disposition: A | Discharge status: Home / Self Care | Payer: PPO | Source: Ambulatory Visit | Attending: Radiation Oncology | Admitting: Radiation Oncology

## 2021-12-11 DIAGNOSIS — Z87891 Personal history of nicotine dependence: Secondary | ICD-10-CM | POA: Diagnosis not present

## 2021-12-11 DIAGNOSIS — C3412 Malignant neoplasm of upper lobe, left bronchus or lung: Secondary | ICD-10-CM | POA: Diagnosis not present

## 2021-12-11 DIAGNOSIS — Z510 Encounter for antineoplastic radiation therapy: Secondary | ICD-10-CM | POA: Diagnosis not present

## 2021-12-11 DIAGNOSIS — C3490 Malignant neoplasm of unspecified part of unspecified bronchus or lung: Secondary | ICD-10-CM | POA: Diagnosis not present

## 2021-12-11 LAB — RAD ONC ARIA SESSION SUMMARY
Course Elapsed Days: 14
Plan Fractions Treated to Date: 10
Plan Prescribed Dose Per Fraction: 2 Gy
Plan Total Fractions Prescribed: 30
Plan Total Prescribed Dose: 60 Gy
Reference Point Dosage Given to Date: 20 Gy
Reference Point Session Dosage Given: 2 Gy
Session Number: 10

## 2021-12-11 MED ORDER — XTAMPZA ER 13.5 MG PO C12A: 13.5000 mg | EXTENDED_RELEASE_CAPSULE | Freq: Two times a day (BID) | ORAL | 0 refills | Status: DC

## 2021-12-12 ENCOUNTER — Other Ambulatory Visit: Payer: Self-pay

## 2021-12-12 ENCOUNTER — Ambulatory Visit
Admission: RE | Admit: 2021-12-12 | Discharge: 2021-12-12 | Disposition: A | Discharge status: Home / Self Care | Payer: PPO | Source: Ambulatory Visit | Attending: Radiation Oncology | Admitting: Radiation Oncology

## 2021-12-12 DIAGNOSIS — Z87891 Personal history of nicotine dependence: Secondary | ICD-10-CM | POA: Diagnosis not present

## 2021-12-12 DIAGNOSIS — Z510 Encounter for antineoplastic radiation therapy: Secondary | ICD-10-CM | POA: Diagnosis not present

## 2021-12-12 DIAGNOSIS — C3412 Malignant neoplasm of upper lobe, left bronchus or lung: Secondary | ICD-10-CM | POA: Diagnosis not present

## 2021-12-12 DIAGNOSIS — C3490 Malignant neoplasm of unspecified part of unspecified bronchus or lung: Secondary | ICD-10-CM | POA: Diagnosis not present

## 2021-12-12 LAB — RAD ONC ARIA SESSION SUMMARY
Course Elapsed Days: 15
Plan Fractions Treated to Date: 11
Plan Prescribed Dose Per Fraction: 2 Gy
Plan Total Fractions Prescribed: 30
Plan Total Prescribed Dose: 60 Gy
Reference Point Dosage Given to Date: 22 Gy
Reference Point Session Dosage Given: 2 Gy
Session Number: 11

## 2021-12-15 ENCOUNTER — Other Ambulatory Visit: Payer: Self-pay

## 2021-12-15 ENCOUNTER — Ambulatory Visit
Admission: RE | Admit: 2021-12-15 | Discharge: 2021-12-15 | Disposition: A | Discharge status: Home / Self Care | Payer: PPO | Source: Ambulatory Visit | Attending: Radiation Oncology | Admitting: Radiation Oncology

## 2021-12-15 DIAGNOSIS — C3490 Malignant neoplasm of unspecified part of unspecified bronchus or lung: Secondary | ICD-10-CM | POA: Diagnosis not present

## 2021-12-15 DIAGNOSIS — Z87891 Personal history of nicotine dependence: Secondary | ICD-10-CM | POA: Diagnosis not present

## 2021-12-15 DIAGNOSIS — Z510 Encounter for antineoplastic radiation therapy: Secondary | ICD-10-CM | POA: Diagnosis not present

## 2021-12-15 DIAGNOSIS — C3412 Malignant neoplasm of upper lobe, left bronchus or lung: Secondary | ICD-10-CM | POA: Diagnosis not present

## 2021-12-15 LAB — RAD ONC ARIA SESSION SUMMARY
Course Elapsed Days: 18
Plan Fractions Treated to Date: 12
Plan Prescribed Dose Per Fraction: 2 Gy
Plan Total Fractions Prescribed: 30
Plan Total Prescribed Dose: 60 Gy
Reference Point Dosage Given to Date: 24 Gy
Reference Point Session Dosage Given: 2 Gy
Session Number: 12

## 2021-12-16 ENCOUNTER — Other Ambulatory Visit: Payer: Self-pay | Admitting: Radiation Oncology

## 2021-12-16 ENCOUNTER — Other Ambulatory Visit: Payer: Self-pay

## 2021-12-16 ENCOUNTER — Ambulatory Visit
Admission: RE | Admit: 2021-12-16 | Discharge: 2021-12-16 | Disposition: A | Discharge status: Home / Self Care | Payer: PPO | Source: Ambulatory Visit | Attending: Radiation Oncology | Admitting: Radiation Oncology

## 2021-12-16 DIAGNOSIS — Z510 Encounter for antineoplastic radiation therapy: Secondary | ICD-10-CM | POA: Diagnosis not present

## 2021-12-16 DIAGNOSIS — C3490 Malignant neoplasm of unspecified part of unspecified bronchus or lung: Secondary | ICD-10-CM | POA: Diagnosis not present

## 2021-12-16 DIAGNOSIS — Z87891 Personal history of nicotine dependence: Secondary | ICD-10-CM | POA: Diagnosis not present

## 2021-12-16 DIAGNOSIS — G4733 Obstructive sleep apnea (adult) (pediatric): Secondary | ICD-10-CM | POA: Diagnosis not present

## 2021-12-16 DIAGNOSIS — C3412 Malignant neoplasm of upper lobe, left bronchus or lung: Secondary | ICD-10-CM | POA: Diagnosis not present

## 2021-12-16 LAB — RAD ONC ARIA SESSION SUMMARY
Course Elapsed Days: 19
Plan Fractions Treated to Date: 13
Plan Prescribed Dose Per Fraction: 2 Gy
Plan Total Fractions Prescribed: 30
Plan Total Prescribed Dose: 60 Gy
Reference Point Dosage Given to Date: 26 Gy
Reference Point Session Dosage Given: 2 Gy
Session Number: 13

## 2021-12-16 MED ORDER — SUCRALFATE 1 G PO TABS: 1.0000 g | ORAL_TABLET | Freq: Three times a day (TID) | ORAL | 1 refills | Status: DC

## 2021-12-17 ENCOUNTER — Other Ambulatory Visit: Payer: Self-pay

## 2021-12-17 ENCOUNTER — Ambulatory Visit
Admission: RE | Admit: 2021-12-17 | Discharge: 2021-12-17 | Disposition: A | Discharge status: Home / Self Care | Payer: PPO | Source: Ambulatory Visit | Attending: Radiation Oncology | Admitting: Radiation Oncology

## 2021-12-17 DIAGNOSIS — C3490 Malignant neoplasm of unspecified part of unspecified bronchus or lung: Secondary | ICD-10-CM | POA: Diagnosis not present

## 2021-12-17 DIAGNOSIS — C3412 Malignant neoplasm of upper lobe, left bronchus or lung: Secondary | ICD-10-CM | POA: Diagnosis not present

## 2021-12-17 DIAGNOSIS — Z87891 Personal history of nicotine dependence: Secondary | ICD-10-CM | POA: Diagnosis not present

## 2021-12-17 DIAGNOSIS — Z510 Encounter for antineoplastic radiation therapy: Secondary | ICD-10-CM | POA: Diagnosis not present

## 2021-12-17 LAB — RAD ONC ARIA SESSION SUMMARY
Course Elapsed Days: 20
Plan Fractions Treated to Date: 14
Plan Prescribed Dose Per Fraction: 2 Gy
Plan Total Fractions Prescribed: 30
Plan Total Prescribed Dose: 60 Gy
Reference Point Dosage Given to Date: 28 Gy
Reference Point Session Dosage Given: 2 Gy
Session Number: 14

## 2021-12-18 ENCOUNTER — Ambulatory Visit
Admission: RE | Admit: 2021-12-18 | Discharge: 2021-12-18 | Disposition: A | Discharge status: Home / Self Care | Payer: PPO | Source: Ambulatory Visit | Attending: Radiation Oncology | Admitting: Radiation Oncology

## 2021-12-18 ENCOUNTER — Other Ambulatory Visit: Payer: Self-pay

## 2021-12-18 DIAGNOSIS — Z87891 Personal history of nicotine dependence: Secondary | ICD-10-CM | POA: Diagnosis not present

## 2021-12-18 DIAGNOSIS — C3490 Malignant neoplasm of unspecified part of unspecified bronchus or lung: Secondary | ICD-10-CM | POA: Diagnosis not present

## 2021-12-18 DIAGNOSIS — C3412 Malignant neoplasm of upper lobe, left bronchus or lung: Secondary | ICD-10-CM | POA: Diagnosis not present

## 2021-12-18 DIAGNOSIS — Z510 Encounter for antineoplastic radiation therapy: Secondary | ICD-10-CM | POA: Diagnosis not present

## 2021-12-18 LAB — RAD ONC ARIA SESSION SUMMARY
Course Elapsed Days: 21
Plan Fractions Treated to Date: 15
Plan Prescribed Dose Per Fraction: 2 Gy
Plan Total Fractions Prescribed: 30
Plan Total Prescribed Dose: 60 Gy
Reference Point Dosage Given to Date: 30 Gy
Reference Point Session Dosage Given: 2 Gy
Session Number: 15

## 2021-12-19 ENCOUNTER — Other Ambulatory Visit: Payer: Self-pay

## 2021-12-19 ENCOUNTER — Ambulatory Visit
Admission: RE | Admit: 2021-12-19 | Discharge: 2021-12-19 | Disposition: A | Discharge status: Home / Self Care | Payer: PPO | Source: Ambulatory Visit | Attending: Radiation Oncology | Admitting: Radiation Oncology

## 2021-12-19 DIAGNOSIS — Z87891 Personal history of nicotine dependence: Secondary | ICD-10-CM | POA: Diagnosis not present

## 2021-12-19 DIAGNOSIS — C3490 Malignant neoplasm of unspecified part of unspecified bronchus or lung: Secondary | ICD-10-CM | POA: Diagnosis not present

## 2021-12-19 DIAGNOSIS — C3412 Malignant neoplasm of upper lobe, left bronchus or lung: Secondary | ICD-10-CM | POA: Diagnosis not present

## 2021-12-19 DIAGNOSIS — Z510 Encounter for antineoplastic radiation therapy: Secondary | ICD-10-CM | POA: Diagnosis not present

## 2021-12-19 LAB — RAD ONC ARIA SESSION SUMMARY
Course Elapsed Days: 22
Plan Fractions Treated to Date: 16
Plan Prescribed Dose Per Fraction: 2 Gy
Plan Total Fractions Prescribed: 30
Plan Total Prescribed Dose: 60 Gy
Reference Point Dosage Given to Date: 32 Gy
Reference Point Session Dosage Given: 2 Gy
Session Number: 16

## 2021-12-22 ENCOUNTER — Inpatient Hospital Stay (HOSPITAL_BASED_OUTPATIENT_CLINIC_OR_DEPARTMENT_OTHER): Payer: PPO | Admitting: Family

## 2021-12-22 ENCOUNTER — Encounter: Payer: Self-pay | Admitting: Family

## 2021-12-22 ENCOUNTER — Inpatient Hospital Stay: Payer: PPO

## 2021-12-22 ENCOUNTER — Other Ambulatory Visit: Payer: Self-pay

## 2021-12-22 ENCOUNTER — Ambulatory Visit
Admission: RE | Admit: 2021-12-22 | Discharge: 2021-12-22 | Disposition: A | Discharge status: Home / Self Care | Payer: PPO | Source: Ambulatory Visit | Attending: Radiation Oncology | Admitting: Radiation Oncology

## 2021-12-22 VITALS — BP 136/83 | HR 89 | Temp 98.0°F | Resp 18 | Ht 59.5 in | Wt 121.0 lb

## 2021-12-22 DIAGNOSIS — Z95828 Presence of other vascular implants and grafts: Secondary | ICD-10-CM

## 2021-12-22 DIAGNOSIS — C3412 Malignant neoplasm of upper lobe, left bronchus or lung: Secondary | ICD-10-CM | POA: Insufficient documentation

## 2021-12-22 DIAGNOSIS — Z79899 Other long term (current) drug therapy: Secondary | ICD-10-CM | POA: Insufficient documentation

## 2021-12-22 DIAGNOSIS — C3490 Malignant neoplasm of unspecified part of unspecified bronchus or lung: Secondary | ICD-10-CM

## 2021-12-22 DIAGNOSIS — E032 Hypothyroidism due to medicaments and other exogenous substances: Secondary | ICD-10-CM

## 2021-12-22 DIAGNOSIS — Z510 Encounter for antineoplastic radiation therapy: Secondary | ICD-10-CM | POA: Diagnosis not present

## 2021-12-22 DIAGNOSIS — Z87891 Personal history of nicotine dependence: Secondary | ICD-10-CM | POA: Diagnosis not present

## 2021-12-22 LAB — CMP (CANCER CENTER ONLY)
ALT: 9 U/L (ref 0–44)
AST: 12 U/L — ABNORMAL LOW (ref 15–41)
Albumin: 3.9 g/dL (ref 3.5–5.0)
Alkaline Phosphatase: 49 U/L (ref 38–126)
Anion gap: 8 (ref 5–15)
BUN: 20 mg/dL (ref 8–23)
CO2: 28 mmol/L (ref 22–32)
Calcium: 8.9 mg/dL (ref 8.9–10.3)
Chloride: 104 mmol/L (ref 98–111)
Creatinine: 0.66 mg/dL (ref 0.44–1.00)
GFR, Estimated: >60 mL/min (ref >60)
Glucose, Bld: 180 mg/dL — ABNORMAL HIGH (ref 70–99)
Potassium: 4.2 mmol/L (ref 3.5–5.1)
Sodium: 140 mmol/L (ref 135–145)
Total Bilirubin: 0.2 mg/dL — ABNORMAL LOW (ref 0.3–1.2)
Total Protein: 7.1 g/dL (ref 6.5–8.1)

## 2021-12-22 LAB — RAD ONC ARIA SESSION SUMMARY
Course Elapsed Days: 25
Plan Fractions Treated to Date: 17
Plan Prescribed Dose Per Fraction: 2 Gy
Plan Total Fractions Prescribed: 30
Plan Total Prescribed Dose: 60 Gy
Reference Point Dosage Given to Date: 34 Gy
Reference Point Session Dosage Given: 2 Gy
Session Number: 17

## 2021-12-22 LAB — CBC WITH DIFFERENTIAL (CANCER CENTER ONLY)
Abs Immature Granulocytes: 0.02 10*3/uL (ref 0.00–0.07)
Basophils Absolute: 0 10*3/uL (ref 0.0–0.1)
Basophils Relative: 0 %
Eosinophils Absolute: 0.1 10*3/uL (ref 0.0–0.5)
Eosinophils Relative: 2 %
HCT: 33.3 % — ABNORMAL LOW (ref 36.0–46.0)
Hemoglobin: 10.6 g/dL — ABNORMAL LOW (ref 12.0–15.0)
Immature Granulocytes: 0 %
Lymphocytes Relative: 12 %
Lymphs Abs: 0.7 10*3/uL (ref 0.7–4.0)
MCH: 30.5 pg (ref 26.0–34.0)
MCHC: 31.8 g/dL (ref 30.0–36.0)
MCV: 96 fL (ref 80.0–100.0)
Monocytes Absolute: 0.5 10*3/uL (ref 0.1–1.0)
Monocytes Relative: 9 %
Neutro Abs: 4.3 10*3/uL (ref 1.7–7.7)
Neutrophils Relative %: 77 %
Platelet Count: 205 10*3/uL (ref 150–400)
RBC: 3.47 MIL/uL — ABNORMAL LOW (ref 3.87–5.11)
RDW: 12 % (ref 11.5–15.5)
WBC Count: 5.7 10*3/uL (ref 4.0–10.5)
nRBC: 0 % (ref 0.0–0.2)

## 2021-12-22 LAB — LACTATE DEHYDROGENASE: LDH: 158 U/L (ref 98–192)

## 2021-12-22 MED ORDER — DENOSUMAB 120 MG/1.7ML ~~LOC~~ SOLN
120.0000 mg | Freq: Once | SUBCUTANEOUS | Status: AC
  Administered 2021-12-22: 120 mg via SUBCUTANEOUS
  Filled 2021-12-22: qty 1.7

## 2021-12-22 MED ORDER — SODIUM CHLORIDE 0.9% FLUSH
10.0000 mL | Freq: Once | INTRAVENOUS | Status: AC
  Administered 2021-12-22: 10 mL via INTRAVENOUS

## 2021-12-22 MED ORDER — HEPARIN SOD (PORK) LOCK FLUSH 100 UNIT/ML IV SOLN
500.0000 [IU] | Freq: Once | INTRAVENOUS | Status: AC
  Administered 2021-12-22: 500 [IU] via INTRAVENOUS

## 2021-12-22 MED ORDER — OXYCODONE-ACETAMINOPHEN 7.5-325 MG PO TABS: 1.0000 | ORAL_TABLET | Freq: Four times a day (QID) | ORAL | 0 refills | Status: DC | PRN

## 2021-12-22 NOTE — Patient Instructions (Signed)
Denosumab Injection (Oncology) What is this medication? DENOSUMAB (den oh SUE mab) prevents weakened bones caused by cancer. It may also be used to treat noncancerous bone tumors that cannot be removed by surgery. It can also be used to treat high calcium levels in the blood caused by cancer. It works by blocking a protein that causes bones to break down quickly. This slows down the release of calcium from bones, which lowers calcium levels in your blood. It also makes your bones stronger and less likely to break (fracture). This medicine may be used for other purposes; ask your health care provider or pharmacist if you have questions. COMMON BRAND NAME(S): XGEVA What should I tell my care team before I take this medication? They need to know if you have any of these conditions: Dental disease Having surgery or tooth extraction Infection Kidney disease Low levels of calcium or vitamin D in the blood Malnutrition On hemodialysis Skin conditions or sensitivity Thyroid or parathyroid disease An unusual reaction to denosumab, other medications, foods, dyes, or preservatives Pregnant or trying to get pregnant Breast-feeding How should I use this medication? This medication is for injection under the skin. It is given by your care team in a hospital or clinic setting. A special MedGuide will be given to you before each treatment. Be sure to read this information carefully each time. Talk to your care team about the use of this medication in children. While it may be prescribed for children as young as 13 years for selected conditions, precautions do apply. Overdosage: If you think you have taken too much of this medicine contact a poison control center or emergency room at once. NOTE: This medicine is only for you. Do not share this medicine with others. What if I miss a dose? Keep appointments for follow-up doses. It is important not to miss your dose. Call your care team if you are unable to  keep an appointment. What may interact with this medication? Do not take this medication with any of the following: Other medications containing denosumab This medication may also interact with the following: Medications that lower your chance of fighting infection Steroid medications, such as prednisone or cortisone This list may not describe all possible interactions. Give your health care provider a list of all the medicines, herbs, non-prescription drugs, or dietary supplements you use. Also tell them if you smoke, drink alcohol, or use illegal drugs. Some items may interact with your medicine. What should I watch for while using this medication? Your condition will be monitored carefully while you are receiving this medication. You may need blood work while taking this medication. This medication may increase your risk of getting an infection. Call your care team for advice if you get a fever, chills, sore throat, or other symptoms of a cold or flu. Do not treat yourself. Try to avoid being around people who are sick. You should make sure you get enough calcium and vitamin D while you are taking this medication, unless your care team tells you not to. Discuss the foods you eat and the vitamins you take with your care team. Some people who take this medication have severe bone, joint, or muscle pain. This medication may also increase your risk for jaw problems or a broken thigh bone. Tell your care team right away if you have severe pain in your jaw, bones, joints, or muscles. Tell your care team if you have any pain that does not go away or that gets worse. Talk   to your care team if you may be pregnant. Serious birth defects can occur if you take this medication during pregnancy and for 5 months after the last dose. You will need a negative pregnancy test before starting this medication. Contraception is recommended while taking this medication and for 5 months after the last dose. Your care team  can help you find the option that works for you. What side effects may I notice from receiving this medication? Side effects that you should report to your care team as soon as possible: Allergic reactions--skin rash, itching, hives, swelling of the face, lips, tongue, or throat Bone, joint, or muscle pain Low calcium level--muscle pain or cramps, confusion, tingling, or numbness in the hands or feet Osteonecrosis of the jaw--pain, swelling, or redness in the mouth, numbness of the jaw, poor healing after dental work, unusual discharge from the mouth, visible bones in the mouth Side effects that usually do not require medical attention (report to your care team if they continue or are bothersome): Cough Diarrhea Fatigue Headache Nausea This list may not describe all possible side effects. Call your doctor for medical advice about side effects. You may report side effects to FDA at 1-800-FDA-1088. Where should I keep my medication? This medication is given in a hospital or clinic. It will not be stored at home. NOTE: This sheet is a summary. It may not cover all possible information. If you have questions about this medicine, talk to your doctor, pharmacist, or health care provider.  2023 Elsevier/Gold Standard (2021-08-11 00:00:00)  

## 2021-12-22 NOTE — Progress Notes (Signed)
Hematology and Oncology Follow Up Visit  [name] [id number] 1950/07/22 71 y.o. 12/22/2021   Principle Diagnosis:  Small cell lung cancer -- Extensive stage   Current Therapy:        Carbo/VP-16/Tecentriq -- s/p cycle 6 -- start on 06/11/2021 Xgeva 120 mg subcu every 3 months -- next dose on 03/2022   Interim History:  [name] is here today for follow-up. She is doing well but notes fatigue with radiation. She states that she has 3 weeks of treatment left.  No issues with blood loss. No bruising or petechiae.  No fever, chills, n/v, cough, rash, dizziness, SOB, chest pain, palpitations, abdominal pain or changes in bladder habits.  She has constipation at times and plans to try castor oil in her belly button if needed.  No swelling, tenderness, numbness or tingling in her extremities.  No falls or syncope reported.  Appetite and hydration are good. Her weight is stable at 121 lbs.   ECOG Performance Status: 1 - Symptomatic but completely ambulatory  Medications:  Allergies as of 12/22/2021       Reactions   Hydrocodone-acetaminophen Other (See Comments)   Nightmares, skin crawling   Lyrica [pregabalin] Other (See Comments)   Hallucinations    Crestor [rosuvastatin Calcium] Other (See Comments)   Leg cramps        Medication List        Accurate as of December 22, 2021  1:44 PM. If you have any questions, ask your nurse or doctor.          acetaminophen 500 MG tablet Commonly known as: TYLENOL Take 1,000 mg by mouth every 6 (six) hours as needed for moderate pain.   albuterol 108 (90 Base) MCG/ACT inhaler Commonly known as: VENTOLIN HFA Inhale 2 puffs into the lungs every 6 (six) hours as needed for wheezing or shortness of breath.   ALPRAZolam 0.5 MG tablet Commonly known as: XANAX TAKE ONE TABLET BY MOUTH THREE TIMES A DAY AS NEEDED   blood glucose meter kit and supplies Kit Use daily   chlorhexidine 0.12 % solution Commonly known as:  PERIDEX Use as directed 15 mLs in the mouth or throat 2 (two) times daily.   fentaNYL 12 MCG/HR Commonly known as: [place] 1 patch onto the skin every 3 (three) days.   FLUoxetine 20 MG capsule Commonly known as: PROZAC Take 1 capsule (20 mg total) by mouth daily.   Insulin Pen Needle 32G X 5 MM Misc Commonly known as: NovoTwist Use daily at Bedtime with Lantus   Lantus SoloStar 100 UNIT/ML Solostar Pen Generic drug: insulin glargine Inject 30 Units into the skin daily. HS   levothyroxine 125 MCG tablet Commonly known as: Synthroid Take 1 tablet (125 mcg total) by mouth daily before breakfast.   meloxicam 15 MG tablet Commonly known as: MOBIC Take 1 tablet (15 mg total) by mouth daily.   metFORMIN 500 MG 24 hr tablet Commonly known as: GLUCOPHAGE-XR Take 1 tablet (500 mg total) by mouth in the morning and at bedtime.   omeprazole 20 MG capsule Commonly known as: PRILOSEC Take 1 capsule (20 mg total) by mouth daily.   ONE TOUCH ULTRA 2 w/Device Kit Check blood sugar as directed. Dx:E11.65   OneTouch Verio Flex System w/Device Kit USE AS DIRECTED TO CHECK BLOOD SUGAR FOUR TIMES A DAY   OneTouch Ultra test strip Generic drug: glucose blood USE TO CHECK BLOOD SUGAR FOUR TIMES A DAY AS DIRECTED   oxyCODONE-acetaminophen 7.5-325  MG tablet Commonly known as: Percocet Take 1 tablet by mouth every 6 (six) hours as needed for severe pain.   sucralfate 1 g tablet Commonly known as: Carafate Take 1 tablet (1 g total) by mouth 4 (four) times daily -  with meals and at bedtime. Crush and dissolve in 10 mL's of warm water prior to swallowing   Trulicity 4.31 VQ/0.0QQ Sopn Generic drug: Dulaglutide Inject 0.75 mg into the skin every Saturday.   UNABLE TO FIND Please provide hair prothesis for patient undergoing chemotherapy for cancer diagnosis.   valACYclovir 1000 MG tablet Commonly known as: VALTREX TAKE ONE TABLET BY MOUTH DAILY AS NEEDED   Xtampza ER 13.5  MG C12a Generic drug: oxyCODONE ER Take 13.5 mg by mouth 2 (two) times daily.        Allergies:  Allergies  Allergen Reactions   Hydrocodone-Acetaminophen Other (See Comments)    Nightmares, skin crawling   Lyrica [Pregabalin] Other (See Comments)    Hallucinations    Crestor [Rosuvastatin Calcium] Other (See Comments)    Leg cramps    Past Medical History, Surgical history, Social history, and Family History were reviewed and updated.  Review of Systems: All other 10 point review of systems is negative.   Physical Exam:  height is 4' 11.5" (1.511 m) and weight is 121 lb (54.9 kg). Her oral temperature is 98 F (36.7 C). Her blood pressure is 136/83 and her pulse is 89. Her respiration is 18 and oxygen saturation is 97%.   Wt Readings from Last 3 Encounters:  12/22/21 121 lb (54.9 kg)  11/19/21 123 lb 3.2 oz (55.9 kg)  11/14/21 121 lb (54.9 kg)    Ocular: Sclerae unicteric, pupils equal, round and reactive to light Ear-nose-throat: Oropharynx clear, dentition fair Lymphatic: No cervical or supraclavicular adenopathy Lungs no rales or rhonchi, good excursion bilaterally Heart regular rate and rhythm, no murmur appreciated Abd soft, nontender, positive bowel sounds MSK no focal spinal tenderness, no joint edema Neuro: non-focal, well-oriented, appropriate affect Breasts: Deferred   Lab Results  Component Value Date   WBC 5.7 12/22/2021   HGB 10.6 (L) 12/22/2021   HCT 33.3 (L) 12/22/2021   MCV 96.0 12/22/2021   PLT 205 12/22/2021   No results found for: "FERRITIN", "IRON", "TIBC", "UIBC", "IRONPCTSAT" Lab Results  Component Value Date   RBC 3.47 (L) 12/22/2021   No results found for: "KPAFRELGTCHN", "LAMBDASER", "KAPLAMBRATIO" No results found for: "IGGSERUM", "IGA", "IGMSERUM" No results found for: "TOTALPROTELP", "ALBUMINELP", "A1GS", "A2GS", "BETS", "BETA2SER", "GAMS", "MSPIKE", "SPEI"   Chemistry      Component Value Date/Time   NA 140 12/22/2021 1300    K 4.2 12/22/2021 1300   CL 104 12/22/2021 1300   CO2 28 12/22/2021 1300   BUN 20 12/22/2021 1300   CREATININE 0.66 12/22/2021 1300   CREATININE 0.88 05/08/2021 1126      Component Value Date/Time   CALCIUM 8.9 12/22/2021 1300   ALKPHOS 49 12/22/2021 1300   AST 12 (L) 12/22/2021 1300   ALT 9 12/22/2021 1300   BILITOT 0.2 (L) 12/22/2021 1300       Impression and Plan: Ms. Achee is a very pleasant 71 yo caucasian female with extensive stage small cell lung cancer.  She has completed her chemotherapy and has 3 weeks of radiation therapy left.  We will proceed with Delton See today as planned.  Follow-up in 6 weeks with Dr. Marin Olp.   Lottie Dawson, NP 9/18/20231:44 PM

## 2021-12-23 ENCOUNTER — Encounter: Payer: Self-pay | Admitting: *Deleted

## 2021-12-23 ENCOUNTER — Other Ambulatory Visit: Payer: Self-pay

## 2021-12-23 ENCOUNTER — Ambulatory Visit
Admission: RE | Admit: 2021-12-23 | Discharge: 2021-12-23 | Disposition: A | Discharge status: Home / Self Care | Payer: PPO | Source: Ambulatory Visit | Attending: Radiation Oncology | Admitting: Radiation Oncology

## 2021-12-23 DIAGNOSIS — Z87891 Personal history of nicotine dependence: Secondary | ICD-10-CM | POA: Diagnosis not present

## 2021-12-23 DIAGNOSIS — C3490 Malignant neoplasm of unspecified part of unspecified bronchus or lung: Secondary | ICD-10-CM | POA: Diagnosis not present

## 2021-12-23 DIAGNOSIS — C778 Secondary and unspecified malignant neoplasm of lymph nodes of multiple regions: Secondary | ICD-10-CM

## 2021-12-23 DIAGNOSIS — Z510 Encounter for antineoplastic radiation therapy: Secondary | ICD-10-CM | POA: Diagnosis not present

## 2021-12-23 DIAGNOSIS — C3412 Malignant neoplasm of upper lobe, left bronchus or lung: Secondary | ICD-10-CM | POA: Diagnosis not present

## 2021-12-23 LAB — RAD ONC ARIA SESSION SUMMARY
Course Elapsed Days: 26
Plan Fractions Treated to Date: 18
Plan Prescribed Dose Per Fraction: 2 Gy
Plan Total Fractions Prescribed: 30
Plan Total Prescribed Dose: 60 Gy
Reference Point Dosage Given to Date: 36 Gy
Reference Point Session Dosage Given: 2 Gy
Session Number: 18

## 2021-12-23 MED ORDER — SONAFINE EX EMUL
1.0000 | Freq: Once | CUTANEOUS | Status: AC
  Administered 2021-12-23: 1 via TOPICAL

## 2021-12-23 NOTE — Progress Notes (Signed)
Patient doing well. Continuing with radiation therapy.   Oncology Nurse Navigator Documentation     12/23/2021    7:45 AM  Oncology Nurse Navigator Flowsheets  Navigator Follow Up Date: 02/02/2022  Navigator Follow Up Reason: Follow-up Appointment  Navigator Location [facility]  Navigator Encounter Type Appt/Treatment Plan Review  Patient Visit Type MedOnc  Treatment Phase Active Tx  Barriers/Navigation Needs Coordination of Care;Education  Interventions None Required  Acuity Level 2-Minimal Needs (1-2 Barriers Identified)  Support Groups/Services Friends and Family  Time Spent with Patient 15

## 2021-12-24 ENCOUNTER — Ambulatory Visit
Admission: RE | Admit: 2021-12-24 | Discharge: 2021-12-24 | Disposition: A | Discharge status: Home / Self Care | Payer: PPO | Source: Ambulatory Visit | Attending: Radiation Oncology | Admitting: Radiation Oncology

## 2021-12-24 ENCOUNTER — Other Ambulatory Visit: Payer: Self-pay

## 2021-12-24 DIAGNOSIS — C3412 Malignant neoplasm of upper lobe, left bronchus or lung: Secondary | ICD-10-CM | POA: Diagnosis not present

## 2021-12-24 DIAGNOSIS — Z87891 Personal history of nicotine dependence: Secondary | ICD-10-CM | POA: Diagnosis not present

## 2021-12-24 DIAGNOSIS — Z510 Encounter for antineoplastic radiation therapy: Secondary | ICD-10-CM | POA: Diagnosis not present

## 2021-12-24 DIAGNOSIS — C3490 Malignant neoplasm of unspecified part of unspecified bronchus or lung: Secondary | ICD-10-CM | POA: Diagnosis not present

## 2021-12-24 LAB — RAD ONC ARIA SESSION SUMMARY
Course Elapsed Days: 27
Plan Fractions Treated to Date: 19
Plan Prescribed Dose Per Fraction: 2 Gy
Plan Total Fractions Prescribed: 30
Plan Total Prescribed Dose: 60 Gy
Reference Point Dosage Given to Date: 38 Gy
Reference Point Session Dosage Given: 2 Gy
Session Number: 19

## 2021-12-25 ENCOUNTER — Ambulatory Visit
Admission: RE | Admit: 2021-12-25 | Discharge: 2021-12-25 | Disposition: A | Discharge status: Home / Self Care | Payer: PPO | Source: Ambulatory Visit | Attending: Radiation Oncology | Admitting: Radiation Oncology

## 2021-12-25 ENCOUNTER — Other Ambulatory Visit: Payer: Self-pay

## 2021-12-25 DIAGNOSIS — C3490 Malignant neoplasm of unspecified part of unspecified bronchus or lung: Secondary | ICD-10-CM | POA: Diagnosis not present

## 2021-12-25 DIAGNOSIS — Z87891 Personal history of nicotine dependence: Secondary | ICD-10-CM | POA: Diagnosis not present

## 2021-12-25 DIAGNOSIS — Z510 Encounter for antineoplastic radiation therapy: Secondary | ICD-10-CM | POA: Diagnosis not present

## 2021-12-25 DIAGNOSIS — C3412 Malignant neoplasm of upper lobe, left bronchus or lung: Secondary | ICD-10-CM | POA: Diagnosis not present

## 2021-12-25 LAB — RAD ONC ARIA SESSION SUMMARY
Course Elapsed Days: 28
Plan Fractions Treated to Date: 20
Plan Prescribed Dose Per Fraction: 2 Gy
Plan Total Fractions Prescribed: 30
Plan Total Prescribed Dose: 60 Gy
Reference Point Dosage Given to Date: 40 Gy
Reference Point Session Dosage Given: 2 Gy
Session Number: 20

## 2021-12-26 ENCOUNTER — Other Ambulatory Visit: Payer: Self-pay

## 2021-12-26 ENCOUNTER — Ambulatory Visit
Admission: RE | Admit: 2021-12-26 | Discharge: 2021-12-26 | Disposition: A | Discharge status: Home / Self Care | Payer: PPO | Source: Ambulatory Visit | Attending: Radiation Oncology | Admitting: Radiation Oncology

## 2021-12-26 DIAGNOSIS — Z510 Encounter for antineoplastic radiation therapy: Secondary | ICD-10-CM | POA: Diagnosis not present

## 2021-12-26 DIAGNOSIS — Z87891 Personal history of nicotine dependence: Secondary | ICD-10-CM | POA: Diagnosis not present

## 2021-12-26 DIAGNOSIS — C3412 Malignant neoplasm of upper lobe, left bronchus or lung: Secondary | ICD-10-CM | POA: Diagnosis not present

## 2021-12-26 DIAGNOSIS — C3490 Malignant neoplasm of unspecified part of unspecified bronchus or lung: Secondary | ICD-10-CM | POA: Diagnosis not present

## 2021-12-26 LAB — RAD ONC ARIA SESSION SUMMARY
Course Elapsed Days: 29
Plan Fractions Treated to Date: 21
Plan Prescribed Dose Per Fraction: 2 Gy
Plan Total Fractions Prescribed: 30
Plan Total Prescribed Dose: 60 Gy
Reference Point Dosage Given to Date: 42 Gy
Reference Point Session Dosage Given: 2 Gy
Session Number: 21

## 2021-12-29 ENCOUNTER — Other Ambulatory Visit: Payer: Self-pay

## 2021-12-29 ENCOUNTER — Ambulatory Visit
Admission: RE | Admit: 2021-12-29 | Discharge: 2021-12-29 | Disposition: A | Discharge status: Home / Self Care | Payer: PPO | Source: Ambulatory Visit | Attending: Radiation Oncology | Admitting: Radiation Oncology

## 2021-12-29 DIAGNOSIS — Z87891 Personal history of nicotine dependence: Secondary | ICD-10-CM | POA: Diagnosis not present

## 2021-12-29 DIAGNOSIS — C3412 Malignant neoplasm of upper lobe, left bronchus or lung: Secondary | ICD-10-CM | POA: Diagnosis not present

## 2021-12-29 DIAGNOSIS — Z510 Encounter for antineoplastic radiation therapy: Secondary | ICD-10-CM | POA: Diagnosis not present

## 2021-12-29 DIAGNOSIS — C3490 Malignant neoplasm of unspecified part of unspecified bronchus or lung: Secondary | ICD-10-CM | POA: Diagnosis not present

## 2021-12-29 LAB — RAD ONC ARIA SESSION SUMMARY
Course Elapsed Days: 32
Plan Fractions Treated to Date: 22
Plan Prescribed Dose Per Fraction: 2 Gy
Plan Total Fractions Prescribed: 30
Plan Total Prescribed Dose: 60 Gy
Reference Point Dosage Given to Date: 44 Gy
Reference Point Session Dosage Given: 2 Gy
Session Number: 22

## 2021-12-30 ENCOUNTER — Ambulatory Visit: Payer: PPO

## 2021-12-30 ENCOUNTER — Ambulatory Visit
Admission: RE | Admit: 2021-12-30 | Discharge: 2021-12-30 | Disposition: A | Discharge status: Home / Self Care | Payer: PPO | Source: Ambulatory Visit | Attending: Radiation Oncology | Admitting: Radiation Oncology

## 2021-12-30 ENCOUNTER — Other Ambulatory Visit: Payer: Self-pay

## 2021-12-30 DIAGNOSIS — Z510 Encounter for antineoplastic radiation therapy: Secondary | ICD-10-CM | POA: Diagnosis not present

## 2021-12-30 DIAGNOSIS — Z87891 Personal history of nicotine dependence: Secondary | ICD-10-CM | POA: Diagnosis not present

## 2021-12-30 DIAGNOSIS — C3490 Malignant neoplasm of unspecified part of unspecified bronchus or lung: Secondary | ICD-10-CM | POA: Diagnosis not present

## 2021-12-30 DIAGNOSIS — C3412 Malignant neoplasm of upper lobe, left bronchus or lung: Secondary | ICD-10-CM | POA: Diagnosis not present

## 2021-12-30 LAB — RAD ONC ARIA SESSION SUMMARY
Course Elapsed Days: 33
Plan Fractions Treated to Date: 23
Plan Prescribed Dose Per Fraction: 2 Gy
Plan Total Fractions Prescribed: 30
Plan Total Prescribed Dose: 60 Gy
Reference Point Dosage Given to Date: 46 Gy
Reference Point Session Dosage Given: 2 Gy
Session Number: 23

## 2021-12-31 ENCOUNTER — Ambulatory Visit
Admission: RE | Admit: 2021-12-31 | Discharge: 2021-12-31 | Disposition: A | Discharge status: Home / Self Care | Payer: PPO | Source: Ambulatory Visit | Attending: Radiation Oncology | Admitting: Radiation Oncology

## 2021-12-31 ENCOUNTER — Telehealth: Payer: Self-pay | Admitting: *Deleted

## 2021-12-31 ENCOUNTER — Other Ambulatory Visit: Payer: Self-pay

## 2021-12-31 DIAGNOSIS — Z87891 Personal history of nicotine dependence: Secondary | ICD-10-CM | POA: Diagnosis not present

## 2021-12-31 DIAGNOSIS — C3412 Malignant neoplasm of upper lobe, left bronchus or lung: Secondary | ICD-10-CM | POA: Diagnosis not present

## 2021-12-31 DIAGNOSIS — Z510 Encounter for antineoplastic radiation therapy: Secondary | ICD-10-CM | POA: Diagnosis not present

## 2021-12-31 DIAGNOSIS — C3490 Malignant neoplasm of unspecified part of unspecified bronchus or lung: Secondary | ICD-10-CM | POA: Diagnosis not present

## 2021-12-31 LAB — RAD ONC ARIA SESSION SUMMARY
Course Elapsed Days: 34
Plan Fractions Treated to Date: 24
Plan Prescribed Dose Per Fraction: 2 Gy
Plan Total Fractions Prescribed: 30
Plan Total Prescribed Dose: 60 Gy
Reference Point Dosage Given to Date: 48 Gy
Reference Point Session Dosage Given: 2 Gy
Session Number: 24

## 2021-12-31 NOTE — Telephone Encounter (Signed)
Per 12/22/21 los - called and gave upcoming appointments - confirmed 

## 2022-01-01 ENCOUNTER — Other Ambulatory Visit: Payer: Self-pay

## 2022-01-01 ENCOUNTER — Ambulatory Visit
Admission: RE | Admit: 2022-01-01 | Discharge: 2022-01-01 | Disposition: A | Discharge status: Home / Self Care | Payer: PPO | Source: Ambulatory Visit | Attending: Radiation Oncology | Admitting: Radiation Oncology

## 2022-01-01 DIAGNOSIS — C3490 Malignant neoplasm of unspecified part of unspecified bronchus or lung: Secondary | ICD-10-CM | POA: Diagnosis not present

## 2022-01-01 DIAGNOSIS — Z87891 Personal history of nicotine dependence: Secondary | ICD-10-CM | POA: Diagnosis not present

## 2022-01-01 DIAGNOSIS — C3412 Malignant neoplasm of upper lobe, left bronchus or lung: Secondary | ICD-10-CM | POA: Diagnosis not present

## 2022-01-01 DIAGNOSIS — Z510 Encounter for antineoplastic radiation therapy: Secondary | ICD-10-CM | POA: Diagnosis not present

## 2022-01-01 LAB — RAD ONC ARIA SESSION SUMMARY
Course Elapsed Days: 35
Plan Fractions Treated to Date: 25
Plan Prescribed Dose Per Fraction: 2 Gy
Plan Total Fractions Prescribed: 30
Plan Total Prescribed Dose: 60 Gy
Reference Point Dosage Given to Date: 50 Gy
Reference Point Session Dosage Given: 2 Gy
Session Number: 25

## 2022-01-02 ENCOUNTER — Other Ambulatory Visit: Payer: Self-pay

## 2022-01-02 ENCOUNTER — Ambulatory Visit
Admission: RE | Admit: 2022-01-02 | Discharge: 2022-01-02 | Disposition: A | Discharge status: Home / Self Care | Payer: PPO | Source: Ambulatory Visit | Attending: Radiation Oncology | Admitting: Radiation Oncology

## 2022-01-02 ENCOUNTER — Encounter: Payer: Self-pay | Admitting: Hematology & Oncology

## 2022-01-02 DIAGNOSIS — Z510 Encounter for antineoplastic radiation therapy: Secondary | ICD-10-CM | POA: Diagnosis not present

## 2022-01-02 DIAGNOSIS — Z87891 Personal history of nicotine dependence: Secondary | ICD-10-CM | POA: Diagnosis not present

## 2022-01-02 DIAGNOSIS — C3490 Malignant neoplasm of unspecified part of unspecified bronchus or lung: Secondary | ICD-10-CM | POA: Diagnosis not present

## 2022-01-02 DIAGNOSIS — C3412 Malignant neoplasm of upper lobe, left bronchus or lung: Secondary | ICD-10-CM | POA: Diagnosis not present

## 2022-01-02 LAB — RAD ONC ARIA SESSION SUMMARY
Course Elapsed Days: 36
Plan Fractions Treated to Date: 26
Plan Prescribed Dose Per Fraction: 2 Gy
Plan Total Fractions Prescribed: 30
Plan Total Prescribed Dose: 60 Gy
Reference Point Dosage Given to Date: 52 Gy
Reference Point Session Dosage Given: 2 Gy
Session Number: 26

## 2022-01-05 ENCOUNTER — Ambulatory Visit
Admission: RE | Admit: 2022-01-05 | Discharge: 2022-01-05 | Disposition: A | Discharge status: Home / Self Care | Payer: PPO | Source: Ambulatory Visit | Attending: Radiation Oncology | Admitting: Radiation Oncology

## 2022-01-05 ENCOUNTER — Other Ambulatory Visit: Payer: Self-pay

## 2022-01-05 DIAGNOSIS — C3490 Malignant neoplasm of unspecified part of unspecified bronchus or lung: Secondary | ICD-10-CM | POA: Insufficient documentation

## 2022-01-05 DIAGNOSIS — C3412 Malignant neoplasm of upper lobe, left bronchus or lung: Secondary | ICD-10-CM | POA: Diagnosis not present

## 2022-01-05 DIAGNOSIS — C778 Secondary and unspecified malignant neoplasm of lymph nodes of multiple regions: Secondary | ICD-10-CM | POA: Insufficient documentation

## 2022-01-05 DIAGNOSIS — Z87891 Personal history of nicotine dependence: Secondary | ICD-10-CM | POA: Diagnosis not present

## 2022-01-05 DIAGNOSIS — Z510 Encounter for antineoplastic radiation therapy: Secondary | ICD-10-CM | POA: Diagnosis not present

## 2022-01-05 LAB — RAD ONC ARIA SESSION SUMMARY
Course Elapsed Days: 39
Plan Fractions Treated to Date: 27
Plan Prescribed Dose Per Fraction: 2 Gy
Plan Total Fractions Prescribed: 30
Plan Total Prescribed Dose: 60 Gy
Reference Point Dosage Given to Date: 54 Gy
Reference Point Session Dosage Given: 2 Gy
Session Number: 27

## 2022-01-06 ENCOUNTER — Other Ambulatory Visit: Payer: Self-pay

## 2022-01-06 ENCOUNTER — Ambulatory Visit: Payer: PPO

## 2022-01-06 ENCOUNTER — Ambulatory Visit
Admission: RE | Admit: 2022-01-06 | Discharge: 2022-01-06 | Disposition: A | Discharge status: Home / Self Care | Payer: PPO | Source: Ambulatory Visit | Attending: Radiation Oncology | Admitting: Radiation Oncology

## 2022-01-06 DIAGNOSIS — C3490 Malignant neoplasm of unspecified part of unspecified bronchus or lung: Secondary | ICD-10-CM

## 2022-01-06 DIAGNOSIS — Z510 Encounter for antineoplastic radiation therapy: Secondary | ICD-10-CM | POA: Diagnosis not present

## 2022-01-06 DIAGNOSIS — Z87891 Personal history of nicotine dependence: Secondary | ICD-10-CM | POA: Diagnosis not present

## 2022-01-06 DIAGNOSIS — C3412 Malignant neoplasm of upper lobe, left bronchus or lung: Secondary | ICD-10-CM | POA: Diagnosis not present

## 2022-01-06 LAB — RAD ONC ARIA SESSION SUMMARY
Course Elapsed Days: 40
Plan Fractions Treated to Date: 28
Plan Prescribed Dose Per Fraction: 2 Gy
Plan Total Fractions Prescribed: 30
Plan Total Prescribed Dose: 60 Gy
Reference Point Dosage Given to Date: 56 Gy
Reference Point Session Dosage Given: 2 Gy
Session Number: 28

## 2022-01-06 MED ORDER — SONAFINE EX EMUL
1.0000 | Freq: Once | CUTANEOUS | Status: AC
  Administered 2022-01-06: 1 via TOPICAL

## 2022-01-07 ENCOUNTER — Other Ambulatory Visit: Payer: Self-pay

## 2022-01-07 ENCOUNTER — Ambulatory Visit
Admission: RE | Admit: 2022-01-07 | Discharge: 2022-01-07 | Disposition: A | Discharge status: Home / Self Care | Payer: PPO | Source: Ambulatory Visit | Attending: Radiation Oncology | Admitting: Radiation Oncology

## 2022-01-07 DIAGNOSIS — C3490 Malignant neoplasm of unspecified part of unspecified bronchus or lung: Secondary | ICD-10-CM | POA: Diagnosis not present

## 2022-01-07 DIAGNOSIS — Z87891 Personal history of nicotine dependence: Secondary | ICD-10-CM | POA: Diagnosis not present

## 2022-01-07 DIAGNOSIS — Z510 Encounter for antineoplastic radiation therapy: Secondary | ICD-10-CM | POA: Diagnosis not present

## 2022-01-07 DIAGNOSIS — C3412 Malignant neoplasm of upper lobe, left bronchus or lung: Secondary | ICD-10-CM | POA: Diagnosis not present

## 2022-01-07 LAB — RAD ONC ARIA SESSION SUMMARY
Course Elapsed Days: 41
Plan Fractions Treated to Date: 29
Plan Prescribed Dose Per Fraction: 2 Gy
Plan Total Fractions Prescribed: 30
Plan Total Prescribed Dose: 60 Gy
Reference Point Dosage Given to Date: 58 Gy
Reference Point Session Dosage Given: 2 Gy
Session Number: 29

## 2022-01-08 ENCOUNTER — Encounter: Payer: Self-pay | Admitting: Radiation Oncology

## 2022-01-08 ENCOUNTER — Other Ambulatory Visit: Payer: Self-pay

## 2022-01-08 ENCOUNTER — Ambulatory Visit
Admission: RE | Admit: 2022-01-08 | Discharge: 2022-01-08 | Disposition: A | Discharge status: Home / Self Care | Payer: PPO | Source: Ambulatory Visit | Attending: Radiation Oncology | Admitting: Radiation Oncology

## 2022-01-08 DIAGNOSIS — Z87891 Personal history of nicotine dependence: Secondary | ICD-10-CM | POA: Diagnosis not present

## 2022-01-08 DIAGNOSIS — Z510 Encounter for antineoplastic radiation therapy: Secondary | ICD-10-CM | POA: Diagnosis not present

## 2022-01-08 DIAGNOSIS — C3412 Malignant neoplasm of upper lobe, left bronchus or lung: Secondary | ICD-10-CM | POA: Diagnosis not present

## 2022-01-08 DIAGNOSIS — C3490 Malignant neoplasm of unspecified part of unspecified bronchus or lung: Secondary | ICD-10-CM | POA: Diagnosis not present

## 2022-01-08 LAB — RAD ONC ARIA SESSION SUMMARY
Course Elapsed Days: 42
Plan Fractions Treated to Date: 30
Plan Prescribed Dose Per Fraction: 2 Gy
Plan Total Fractions Prescribed: 30
Plan Total Prescribed Dose: 60 Gy
Reference Point Dosage Given to Date: 60 Gy
Reference Point Session Dosage Given: 2 Gy
Session Number: 30

## 2022-01-12 ENCOUNTER — Encounter: Payer: Self-pay | Admitting: *Deleted

## 2022-01-12 ENCOUNTER — Other Ambulatory Visit: Payer: Self-pay | Admitting: *Deleted

## 2022-01-12 DIAGNOSIS — E1165 Type 2 diabetes mellitus with hyperglycemia: Secondary | ICD-10-CM

## 2022-01-12 MED ORDER — METFORMIN HCL ER 500 MG PO TB24: 500.0000 mg | ORAL_TABLET | Freq: Two times a day (BID) | ORAL | 0 refills | Status: DC

## 2022-01-18 ENCOUNTER — Other Ambulatory Visit: Payer: Self-pay | Admitting: Hematology & Oncology

## 2022-01-18 DIAGNOSIS — C787 Secondary malignant neoplasm of liver and intrahepatic bile duct: Secondary | ICD-10-CM

## 2022-01-19 ENCOUNTER — Encounter: Payer: Self-pay | Admitting: Hematology & Oncology

## 2022-01-19 MED ORDER — OXYCODONE-ACETAMINOPHEN 7.5-325 MG PO TABS: 1.0000 | ORAL_TABLET | Freq: Four times a day (QID) | ORAL | 0 refills | Status: DC | PRN

## 2022-01-19 NOTE — Telephone Encounter (Signed)
Last refilled 12/22/21. Please advise thanks.

## 2022-01-29 NOTE — Progress Notes (Signed)
Patient here for high dose flu vaccine.  Vaccine given in left deltoid and patient tolerated well.

## 2022-01-30 ENCOUNTER — Ambulatory Visit (INDEPENDENT_AMBULATORY_CARE_PROVIDER_SITE_OTHER): Payer: PPO | Admitting: *Deleted

## 2022-01-30 DIAGNOSIS — Z23 Encounter for immunization: Secondary | ICD-10-CM

## 2022-01-31 ENCOUNTER — Other Ambulatory Visit: Payer: Self-pay | Admitting: Family Medicine

## 2022-01-31 DIAGNOSIS — M1990 Unspecified osteoarthritis, unspecified site: Secondary | ICD-10-CM

## 2022-02-02 ENCOUNTER — Encounter: Payer: Self-pay | Admitting: Family

## 2022-02-02 ENCOUNTER — Inpatient Hospital Stay (HOSPITAL_BASED_OUTPATIENT_CLINIC_OR_DEPARTMENT_OTHER): Payer: PPO | Admitting: Family

## 2022-02-02 ENCOUNTER — Inpatient Hospital Stay: Payer: PPO

## 2022-02-02 ENCOUNTER — Inpatient Hospital Stay: Payer: PPO | Attending: Hematology & Oncology

## 2022-02-02 VITALS — BP 130/85 | HR 90 | Temp 98.0°F | Resp 17 | Wt 118.0 lb

## 2022-02-02 DIAGNOSIS — E032 Hypothyroidism due to medicaments and other exogenous substances: Secondary | ICD-10-CM

## 2022-02-02 DIAGNOSIS — C3412 Malignant neoplasm of upper lobe, left bronchus or lung: Secondary | ICD-10-CM | POA: Diagnosis not present

## 2022-02-02 DIAGNOSIS — R5383 Other fatigue: Secondary | ICD-10-CM | POA: Diagnosis not present

## 2022-02-02 DIAGNOSIS — Z885 Allergy status to narcotic agent status: Secondary | ICD-10-CM | POA: Insufficient documentation

## 2022-02-02 DIAGNOSIS — Z923 Personal history of irradiation: Secondary | ICD-10-CM | POA: Diagnosis not present

## 2022-02-02 DIAGNOSIS — Z79899 Other long term (current) drug therapy: Secondary | ICD-10-CM | POA: Diagnosis not present

## 2022-02-02 DIAGNOSIS — C778 Secondary and unspecified malignant neoplasm of lymph nodes of multiple regions: Secondary | ICD-10-CM | POA: Diagnosis not present

## 2022-02-02 DIAGNOSIS — C3490 Malignant neoplasm of unspecified part of unspecified bronchus or lung: Secondary | ICD-10-CM

## 2022-02-02 DIAGNOSIS — R0602 Shortness of breath: Secondary | ICD-10-CM | POA: Insufficient documentation

## 2022-02-02 DIAGNOSIS — Z95828 Presence of other vascular implants and grafts: Secondary | ICD-10-CM

## 2022-02-02 LAB — CBC WITH DIFFERENTIAL (CANCER CENTER ONLY)
Abs Immature Granulocytes: 0.01 10*3/uL (ref 0.00–0.07)
Basophils Absolute: 0 10*3/uL (ref 0.0–0.1)
Basophils Relative: 0 %
Eosinophils Absolute: 0.1 10*3/uL (ref 0.0–0.5)
Eosinophils Relative: 1 %
HCT: 37.2 % (ref 36.0–46.0)
Hemoglobin: 12.1 g/dL (ref 12.0–15.0)
Immature Granulocytes: 0 %
Lymphocytes Relative: 12 %
Lymphs Abs: 0.5 10*3/uL — ABNORMAL LOW (ref 0.7–4.0)
MCH: 29.7 pg (ref 26.0–34.0)
MCHC: 32.5 g/dL (ref 30.0–36.0)
MCV: 91.4 fL (ref 80.0–100.0)
Monocytes Absolute: 0.7 10*3/uL (ref 0.1–1.0)
Monocytes Relative: 16 %
Neutro Abs: 3 10*3/uL (ref 1.7–7.7)
Neutrophils Relative %: 71 %
Platelet Count: 163 10*3/uL (ref 150–400)
RBC: 4.07 MIL/uL (ref 3.87–5.11)
RDW: 12.1 % (ref 11.5–15.5)
WBC Count: 4.2 10*3/uL (ref 4.0–10.5)
nRBC: 0 % (ref 0.0–0.2)

## 2022-02-02 LAB — LACTATE DEHYDROGENASE: LDH: 195 U/L — ABNORMAL HIGH (ref 98–192)

## 2022-02-02 LAB — CMP (CANCER CENTER ONLY)
ALT: 14 U/L (ref 0–44)
AST: 14 U/L — ABNORMAL LOW (ref 15–41)
Albumin: 4.2 g/dL (ref 3.5–5.0)
Alkaline Phosphatase: 52 U/L (ref 38–126)
Anion gap: 7 (ref 5–15)
BUN: 12 mg/dL (ref 8–23)
CO2: 29 mmol/L (ref 22–32)
Calcium: 9.5 mg/dL (ref 8.9–10.3)
Chloride: 100 mmol/L (ref 98–111)
Creatinine: 0.67 mg/dL (ref 0.44–1.00)
GFR, Estimated: >60 mL/min (ref >60)
Glucose, Bld: 256 mg/dL — ABNORMAL HIGH (ref 70–99)
Potassium: 3.9 mmol/L (ref 3.5–5.1)
Sodium: 136 mmol/L (ref 135–145)
Total Bilirubin: 0.4 mg/dL (ref 0.3–1.2)
Total Protein: 7.2 g/dL (ref 6.5–8.1)

## 2022-02-02 LAB — TSH: TSH: 2.201 u[IU]/mL (ref 0.350–4.500)

## 2022-02-02 MED ORDER — SODIUM CHLORIDE 0.9% FLUSH
10.0000 mL | Freq: Once | INTRAVENOUS | Status: AC
  Administered 2022-02-02: 10 mL via INTRAVENOUS

## 2022-02-02 MED ORDER — HEPARIN SOD (PORK) LOCK FLUSH 100 UNIT/ML IV SOLN
500.0000 [IU] | Freq: Once | INTRAVENOUS | Status: AC
  Administered 2022-02-02: 500 [IU] via INTRAVENOUS

## 2022-02-02 NOTE — Progress Notes (Signed)
Hematology and Oncology Follow Up Visit  [name] [id number] [birth year] 71 y.o. 02/02/2022   Principle Diagnosis:  Small cell lung cancer -- Extensive stage   Current Therapy:        Carbo/VP-16/Tecentriq -- s/p cycle 6 -- start on 06/11/2021 Xgeva 120 mg subcu every 3 months -- next dose on 03/2022 Radiation to the    Interim History:  [name] is here today for follow-up. She completed radiation therapy on 10/5 and is doing well. She still notes persistent fatigue and mild SOB with over exertion. She takes a break to rest as needed.  No fever, chills, n/v, cough, rash, dizziness, chest pain, palpitations, abdominal pain or changes in bowel or bladder habits.  No blood loss noted. No bruising or petechiae.  No swelling, tenderness, numbness or tingling in her extremities.  She has no appetite but [place] trying to eat and also supplementing with 2 Ensure a day. She is doing her best to hydrate and mostly drinks Pepsi.  Weight is 118 lbs (previously 121 lbs).   ECOG Performance Status: 1 - Symptomatic but completely ambulatory  Medications:  Allergies as of 02/02/2022       Reactions   Hydrocodone-acetaminophen Other (See Comments)   Nightmares, skin crawling   Lyrica [pregabalin] Other (See Comments)   Hallucinations    Crestor [rosuvastatin Calcium] Other (See Comments)   Leg cramps        Medication List        Accurate as of February 02, 2022 11:00 AM. If you have any questions, ask your nurse or doctor.          acetaminophen 500 MG tablet Commonly known as: TYLENOL Take 1,000 mg by mouth every 6 (six) hours as needed for moderate pain.   albuterol 108 (90 Base) MCG/ACT inhaler Commonly known as: VENTOLIN HFA Inhale 2 puffs into the lungs every 6 (six) hours as needed for wheezing or shortness of breath.   ALPRAZolam 0.5 MG tablet Commonly known as: XANAX TAKE ONE TABLET BY MOUTH THREE TIMES A DAY AS NEEDED   blood glucose meter kit and supplies  Kit Use daily   chlorhexidine 0.12 % solution Commonly known as: PERIDEX Use as directed 15 mLs in the mouth or throat 2 (two) times daily.   fentaNYL 12 MCG/HR Commonly known as: [place] 1 patch onto the skin every 3 (three) days.   FLUoxetine 20 MG capsule Commonly known as: PROZAC Take 1 capsule (20 mg total) by mouth daily.   Insulin Pen Needle 32G X 5 MM Misc Commonly known as: NovoTwist Use daily at Bedtime with Lantus   Lantus SoloStar 100 UNIT/ML Solostar Pen Generic drug: insulin glargine Inject 30 Units into the skin daily. HS   levothyroxine 125 MCG tablet Commonly known as: Synthroid Take 1 tablet (125 mcg total) by mouth daily before breakfast.   meloxicam 15 MG tablet Commonly known as: MOBIC Take 1 tablet (15 mg total) by mouth daily.   metFORMIN 500 MG 24 hr tablet Commonly known as: GLUCOPHAGE-XR Take 1 tablet (500 mg total) by mouth in the morning and at bedtime.   omeprazole 20 MG capsule Commonly known as: PRILOSEC Take 1 capsule (20 mg total) by mouth daily.   ONE TOUCH ULTRA 2 w/Device Kit Check blood sugar as directed. Dx:E11.65   OneTouch Verio Flex System w/Device Kit USE AS DIRECTED TO CHECK BLOOD SUGAR FOUR TIMES A DAY   OneTouch Ultra test strip Generic drug: glucose blood USE TO  CHECK BLOOD SUGAR FOUR TIMES A DAY AS DIRECTED   oxyCODONE-acetaminophen 7.5-325 MG tablet Commonly known as: Percocet Take 1 tablet by mouth every 6 (six) hours as needed for severe pain.   sucralfate 1 g tablet Commonly known as: Carafate Take 1 tablet (1 g total) by mouth 4 (four) times daily -  with meals and at bedtime. Crush and dissolve in 10 mL's of warm water prior to swallowing   Trulicity 6.76 PP/5.0DT Sopn Generic drug: Dulaglutide Inject 0.75 mg into the skin every Saturday.   UNABLE TO FIND Please provide hair prothesis for patient undergoing chemotherapy for cancer diagnosis.   valACYclovir 1000 MG tablet Commonly known as:  VALTREX TAKE ONE TABLET BY MOUTH DAILY AS NEEDED   Xtampza ER 13.5 MG C12a Generic drug: oxyCODONE ER Take 13.5 mg by mouth 2 (two) times daily.        Allergies:  Allergies  Allergen Reactions   Hydrocodone-Acetaminophen Other (See Comments)    Nightmares, skin crawling   Lyrica [Pregabalin] Other (See Comments)    Hallucinations    Crestor [Rosuvastatin Calcium] Other (See Comments)    Leg cramps    Past Medical History, Surgical history, Social history, and Family History were reviewed and updated.  Review of Systems: All other 10 point review of systems is negative.   Physical Exam:  weight is 118 lb (53.5 kg). Her oral temperature is 98 F (36.7 C). Her blood pressure is 130/85 and her pulse is 90. Her respiration is 17 and oxygen saturation is 95%.   Wt Readings from Last 3 Encounters:  02/02/22 118 lb (53.5 kg)  12/22/21 121 lb (54.9 kg)  11/19/21 123 lb 3.2 oz (55.9 kg)    Ocular: Sclerae unicteric, pupils equal, round and reactive to light Ear-nose-throat: Oropharynx clear, dentition fair Lymphatic: No cervical or supraclavicular adenopathy Lungs no rales or rhonchi, good excursion bilaterally Heart regular rate and rhythm, no murmur appreciated Abd soft, nontender, positive bowel sounds MSK no focal spinal tenderness, no joint edema Neuro: non-focal, well-oriented, appropriate affect Breasts: Deferred    Lab Results  Component Value Date   WBC 4.2 02/02/2022   HGB 12.1 02/02/2022   HCT 37.2 02/02/2022   MCV 91.4 02/02/2022   PLT 163 02/02/2022   No results found for: "FERRITIN", "IRON", "TIBC", "UIBC", "IRONPCTSAT" Lab Results  Component Value Date   RBC 4.07 02/02/2022   No results found for: "KPAFRELGTCHN", "LAMBDASER", "KAPLAMBRATIO" No results found for: "IGGSERUM", "IGA", "IGMSERUM" No results found for: "TOTALPROTELP", "ALBUMINELP", "A1GS", "A2GS", "BETS", "BETA2SER", "GAMS", "MSPIKE", "SPEI"   Chemistry      Component Value Date/Time    NA 140 12/22/2021 1300   K 4.2 12/22/2021 1300   CL 104 12/22/2021 1300   CO2 28 12/22/2021 1300   BUN 20 12/22/2021 1300   CREATININE 0.66 12/22/2021 1300   CREATININE 0.88 05/08/2021 1126      Component Value Date/Time   CALCIUM 8.9 12/22/2021 1300   ALKPHOS 49 12/22/2021 1300   AST 12 (L) 12/22/2021 1300   ALT 9 12/22/2021 1300   BILITOT 0.2 (L) 12/22/2021 1300       Impression and Plan: Ms. Barlett is a very pleasant 71 yo caucasian female with extensive stage small cell lung cancer.   She has completed radiation therapy and now we will get a PET scan in 4 weeks to reassess per MD.  MD follow-up in 4 weeks.   Lottie Dawson, NP 10/30/202311:00 AM

## 2022-02-03 ENCOUNTER — Encounter: Payer: Self-pay | Admitting: *Deleted

## 2022-02-03 NOTE — Progress Notes (Signed)
Patient will need a post treatment PET. Scheduled for 03/04/22.  Patient is aware of PET appointment including date, time, and location. The following prep is reviewed with patient and confirmed with teachback: - arrive 30 minutes before appointment time - NPO except water for 6h before scan. No candy, no gum - hold any diabetic medication the morning of the scan - have a low carb dinner the night prior Radiology Information sheet also mailed to patient's home for reinforcement of education.   Oncology Nurse Navigator Documentation     02/03/2022    9:30 AM  Oncology Nurse Navigator Flowsheets  Planned Course of Treatment Radiation  Phase of Treatment Radiation  Radiation Actual Start Date: 11/27/2021  Radiation Actual End Date: 01/30/2022  Navigator Follow Up Date: 03/04/2022  Navigator Follow Up Reason: Scan Review  Navigator Location [facility]  Navigator Encounter Type Appt/Treatment Plan Review;Telephone  Telephone Education;Outgoing Call  Treatment Initiated Date 06/11/2021  Patient Visit Type MedOnc  Treatment Phase Active Tx  Barriers/Navigation Needs Coordination of Care;Education  Education Other  Interventions Coordination of Care;Education  Acuity Level 2-Minimal Needs (1-2 Barriers Identified)  Coordination of Care Radiology  Education Method Verbal;Written  Support Groups/Services Friends and Family  Time Spent with Patient 30

## 2022-02-04 ENCOUNTER — Telehealth: Payer: Self-pay | Admitting: *Deleted

## 2022-02-04 ENCOUNTER — Encounter: Payer: Self-pay | Admitting: Radiation Oncology

## 2022-02-04 NOTE — Telephone Encounter (Signed)
Per 02/02/22 los - called and gave upcoming appointments - confirmed

## 2022-02-06 ENCOUNTER — Encounter: Payer: Self-pay | Admitting: Family Medicine

## 2022-02-06 ENCOUNTER — Other Ambulatory Visit: Payer: Self-pay | Admitting: Family Medicine

## 2022-02-06 DIAGNOSIS — F419 Anxiety disorder, unspecified: Secondary | ICD-10-CM

## 2022-02-06 NOTE — Telephone Encounter (Signed)
Patient is requesting a refill of the following medications: Requested Prescriptions   Pending Prescriptions Disp Refills   ALPRAZolam (XANAX) 0.5 MG tablet [Pharmacy Med Name: ALPRAZOLAM 0.5 MG TABLET] 90 tablet     Sig: TAKE ONE TABLET BY MOUTH THREE TIMES A DAY AS NEEDED    Date of patient request: 02/06/22 Last office visit: 05/15/21 Paz due to COVID + VV Date of last refill: 10/17/21 Last refill amount: 90 + 0 Follow up time period per chart: None scheduled

## 2022-02-07 NOTE — Progress Notes (Signed)
Radiation Oncology         [phone] ________________________________  Name: [name] MRN: [id number]  Date: 02/09/2022  DOB: 05-Apr-1951  Follow-Up Visit Note  CC: [name], DO  Marin Olp Rudell Cobb, MD  No diagnosis found.  Diagnosis: The primary encounter diagnosis was Small cell lung cancer, left upper lobe ([place]). 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   Extensive stage small cell left lung cancer with thoracic and lumbar vertebral metastases, hepatic metastasis, pancreatic mets, and nodal mets; s/p chemotherapy with resolution of hepatic mets and overall substantial decrease in other metastases   Cancer Staging  Small cell lung cancer, left upper lobe (HCC) Staging form: Lung, AJCC 8th Edition - Clinical stage from 06/06/2021: Stage IVB (cT2b, cN3, pM1c) - Signed by [name], MD on 06/06/2021  Interval Since Last Radiation:  1 month and 1 day  Intent: Curative  Radiation Treatment Dates: 11/27/2021 through 01/08/2022 Site Technique Total Dose (Gy) Dose per Fx (Gy) Completed Fx Beam Energies  Lung, Left: Lung_L 3D 60/60 2 30/30 6X    Narrative:  The patient returns today for routine follow-up. The patient tolerated radiation therapy relatively well. During her final weekly treatment check on 01/06/22, the patient reported fatigue, itching, some difficulty swallowing, and a poor appetite (drank insures daily). Physical exam performed on that same date showed  erythema and hyperpigmentation changes along the upper back region from her radiation therapy. No skin breakdown was appreciated.      Since completing radiation, the patient followed up with Lottie Dawson NP on 02/02/22. During which time, the patient reported feeling well other than persistent fatigue and mild SOB with over exertion. She also reported persistent decreased appetite and drinking 2 ensures daily. She will  continue on xgeva (next dose is scheduled for this coming December).   ***                            Allergies:  is allergic to hydrocodone-acetaminophen, lyrica [pregabalin], and crestor [rosuvastatin calcium].  Meds: Current Outpatient Medications  Medication Sig Dispense Refill   meloxicam (MOBIC) 15 MG tablet Take 1 tablet (15 mg total) by mouth daily. 30 tablet 0   acetaminophen (TYLENOL) 500 MG tablet Take 1,000 mg by mouth every 6 (six) hours as needed for moderate pain.     albuterol (VENTOLIN HFA) 108 (90 Base) MCG/ACT inhaler Inhale 2 puffs into the lungs every 6 (six) hours as needed for wheezing or shortness of breath. 8 g 2   ALPRAZolam (XANAX) 0.5 MG tablet TAKE ONE TABLET BY MOUTH THREE TIMES A DAY AS NEEDED 90 tablet 0   blood glucose meter kit and supplies KIT Use daily 1 each 0   Blood Glucose Monitoring Suppl (ONE TOUCH ULTRA 2) w/Device KIT Check blood sugar as directed. Dx:E11.65 1 kit 0   Blood Glucose Monitoring Suppl (ONETOUCH VERIO FLEX SYSTEM) w/Device KIT USE AS DIRECTED TO CHECK BLOOD SUGAR FOUR TIMES A DAY 1 kit 0   chlorhexidine (PERIDEX) 0.12 % solution Use as directed 15 mLs in the mouth or throat 2 (two) times daily. (Patient not taking: Reported on 11/19/2021) 473 mL 1   fentaNYL (DURAGESIC) 12 MCG/HR Place 1 patch onto the skin every 3 (three) days. (Patient not taking: Reported on 11/19/2021) 10 patch 0   FLUoxetine (PROZAC) 20 MG capsule Take  1 capsule (20 mg total) by mouth daily. 90 capsule 3   glucose blood (ONETOUCH ULTRA) test strip USE TO CHECK BLOOD SUGAR FOUR TIMES A DAY AS DIRECTED 400 strip 1   insulin glargine (LANTUS SOLOSTAR) 100 UNIT/ML Solostar Pen Inject 30 Units into the skin daily. HS     Insulin Pen Needle (NOVOTWIST) 32G X 5 MM MISC Use daily at Bedtime with Lantus 100 each 0   levothyroxine (SYNTHROID) 125 MCG tablet Take 1 tablet (125 mcg total) by mouth daily before breakfast. 90 tablet 3   metFORMIN (GLUCOPHAGE-XR) 500 MG 24 hr  tablet Take 1 tablet (500 mg total) by mouth in the morning and at bedtime. 180 tablet 0   omeprazole (PRILOSEC) 20 MG capsule Take 1 capsule (20 mg total) by mouth daily. 90 capsule 3   oxyCODONE ER (XTAMPZA ER) 13.5 MG C12A Take 13.5 mg by mouth 2 (two) times daily. 60 capsule 0   oxyCODONE-acetaminophen (PERCOCET) 7.5-325 MG tablet Take 1 tablet by mouth every 6 (six) hours as needed for severe pain. 90 tablet 0   sucralfate (CARAFATE) 1 g tablet Take 1 tablet (1 g total) by mouth 4 (four) times daily -  with meals and at bedtime. Crush and dissolve in 10 mL's of warm water prior to swallowing 921 tablet 1   TRULICITY 1.94 RD/4.0CX SOPN Inject 0.75 mg into the skin every Saturday.     UNABLE TO FIND Please provide hair prothesis for patient undergoing chemotherapy for cancer diagnosis. 1 Units 0   valACYclovir (VALTREX) 1000 MG tablet TAKE ONE TABLET BY MOUTH DAILY AS NEEDED 30 tablet 0   Current Facility-Administered Medications  Medication Dose Route Frequency Provider Last Rate Last Admin   ipratropium-albuterol (DUONEB) 0.5-2.5 (3) MG/3ML nebulizer solution 3 mL  3 mL Nebulization Q6H Lowne Chase, Kendrick Fries R, DO       Facility-Administered Medications Ordered in Other Encounters  Medication Dose Route Frequency Provider Last Rate Last Admin   sodium chloride flush (NS) 0.9 % injection 10 mL  10 mL Intravenous PRN Celso Amy, NP   10 mL at 08/18/21 4481    Physical Findings: The patient is in no acute distress. Patient is alert and oriented.  vitals were not taken for this visit. .  No significant changes. Lungs are clear to auscultation bilaterally. Heart has regular rate and rhythm. No palpable cervical, supraclavicular, or axillary adenopathy. Abdomen soft, non-tender, normal bowel sounds.   Lab Findings: Lab Results  Component Value Date   WBC 4.2 02/02/2022   HGB 12.1 02/02/2022   HCT 37.2 02/02/2022   MCV 91.4 02/02/2022   PLT 163 02/02/2022    Radiographic  Findings: No results found.  Impression:  The primary encounter diagnosis was Small cell lung cancer, left upper lobe ([place]). 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   Extensive stage small cell left lung cancer with thoracic and lumbar vertebral metastases, hepatic metastasis, pancreatic mets, and nodal mets; s/p chemotherapy with resolution of hepatic mets and overall substantial decrease in other metastases  The patient is recovering from the effects of radiation.  ***  Plan:  ***   *** minutes of total time was spent for this patient encounter, including preparation, face-to-face counseling with the patient and coordination of care, physical exam, and documentation of the encounter. ____________________________________  Blair Promise, PhD, MD  This document serves as a record of services  personally performed by Gery Pray, MD. It was created on his behalf by Roney Mans, a trained medical scribe. The creation of this record is based on the scribe's personal observations and the provider's statements to them. This document has been checked and approved by the attending provider.

## 2022-02-07 NOTE — Progress Notes (Incomplete)
  Radiation Oncology         (336) [phone] ________________________________  Patient Name: [name] MRN: [id number] DOB: February 26, 1951 Referring Physician: Burney Gauze (Profile Not Attached) Date of Service: 01/08/2022 [facility], [place]                                                        End Of Treatment Note  Diagnoses: C34.12-Malignant neoplasm of upper lobe, left bronchus or lung  Cancer Staging: The primary encounter diagnosis was Small cell lung cancer, left upper lobe ([place]). 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   Extensive stage small cell left lung cancer with thoracic and lumbar vertebral metastases, hepatic metastasis, pancreatic mets, and nodal mets; s/p chemotherapy with resolution of hepatic mets and overall substantial decrease in other metastases   Cancer Staging  Small cell lung cancer, left upper lobe (HCC) Staging form: Lung, AJCC 8th Edition - Clinical stage from 06/06/2021: Stage IVB (cT2b, cN3, pM1c) - Signed by [name], MD on 06/06/2021  Intent: Curative  Radiation Treatment Dates: 11/27/2021 through 01/08/2022 Site Technique Total Dose (Gy) Dose per Fx (Gy) Completed Fx Beam Energies  Lung, Left: Lung_L 3D 60/60 2 30/30 6X   Narrative: The patient tolerated radiation therapy relatively well. During her final weekly treatment check on 01/06/22, the patient fatigue, itching,  some difficulty swallowing, and a poor appetite (drank insures daily). Physical exam performed on that same date showed  erythema and hyperpigmentation changes along the upper back region from her radiation therapy. No skin breakdown was appreciated.   Plan: The patient will follow-up with radiation oncology in one month.  ________________________________________________ -----------------------------------  Blair Promise, PhD, MD  This document serves as a record of  services personally performed by Gery Pray, MD. It was created on his behalf by Roney Mans, a trained medical scribe. The creation of this record is based on the scribe's personal observations and the provider's statements to them. This document has been checked and approved by the attending provider.

## 2022-02-09 ENCOUNTER — Ambulatory Visit
Admission: RE | Admit: 2022-02-09 | Discharge: 2022-02-09 | Disposition: A | Discharge status: Home / Self Care | Payer: PPO | Source: Ambulatory Visit | Attending: Radiation Oncology | Admitting: Radiation Oncology

## 2022-02-09 VITALS — BP 140/94 | HR 97 | Temp 97.5°F | Resp 20 | Ht 59.0 in | Wt 119.2 lb

## 2022-02-09 DIAGNOSIS — Z7984 Long term (current) use of oral hypoglycemic drugs: Secondary | ICD-10-CM | POA: Diagnosis not present

## 2022-02-09 DIAGNOSIS — C7951 Secondary malignant neoplasm of bone: Secondary | ICD-10-CM | POA: Insufficient documentation

## 2022-02-09 DIAGNOSIS — C778 Secondary and unspecified malignant neoplasm of lymph nodes of multiple regions: Secondary | ICD-10-CM | POA: Diagnosis not present

## 2022-02-09 DIAGNOSIS — Z79899 Other long term (current) drug therapy: Secondary | ICD-10-CM | POA: Insufficient documentation

## 2022-02-09 DIAGNOSIS — C3412 Malignant neoplasm of upper lobe, left bronchus or lung: Secondary | ICD-10-CM | POA: Insufficient documentation

## 2022-02-09 DIAGNOSIS — Z923 Personal history of irradiation: Secondary | ICD-10-CM | POA: Insufficient documentation

## 2022-02-09 DIAGNOSIS — Z791 Long term (current) use of non-steroidal anti-inflammatories (NSAID): Secondary | ICD-10-CM | POA: Diagnosis not present

## 2022-02-09 DIAGNOSIS — C787 Secondary malignant neoplasm of liver and intrahepatic bile duct: Secondary | ICD-10-CM | POA: Diagnosis not present

## 2022-02-09 DIAGNOSIS — Z79890 Hormone replacement therapy (postmenopausal): Secondary | ICD-10-CM | POA: Diagnosis not present

## 2022-02-09 DIAGNOSIS — Z7985 Long-term (current) use of injectable non-insulin antidiabetic drugs: Secondary | ICD-10-CM | POA: Diagnosis not present

## 2022-02-09 DIAGNOSIS — C7889 Secondary malignant neoplasm of other digestive organs: Secondary | ICD-10-CM | POA: Insufficient documentation

## 2022-02-09 HISTORY — DX: Personal history of irradiation: Z92.3

## 2022-02-09 NOTE — Progress Notes (Signed)
[name] is here today for follow up post radiation to the lung.  Lung Side: left  Completed radiation treatment on: 01/08/2022  Does the patient complain of any of the following: Pain:no Shortness of breath w/wo exertion: no Cough: no Hemoptysis: no Pain with swallowing: has burning with swallowing.  Not taking Carafate currently. Swallowing/choking concerns: no Appetite: good Energy Level: good Post radiation skin Changes: has dryness and itching on her central back.    Additional comments if applicable: None noted.  BP (!) 140/94 (BP Location: Right Arm, Patient Position: Sitting, Cuff Size: Normal)   Pulse 97   Temp (!) 97.5 F (36.4 C)   Resp 20   Ht 4\' 11"  (1.499 m)   Wt 119 lb 3.2 oz (54.1 kg)   SpO2 97%   BMI 24.08 kg/m

## 2022-02-16 ENCOUNTER — Other Ambulatory Visit: Payer: Self-pay

## 2022-02-16 DIAGNOSIS — C787 Secondary malignant neoplasm of liver and intrahepatic bile duct: Secondary | ICD-10-CM

## 2022-02-16 MED ORDER — OXYCODONE-ACETAMINOPHEN 7.5-325 MG PO TABS: 1.0000 | ORAL_TABLET | Freq: Four times a day (QID) | ORAL | 0 refills | Status: DC | PRN

## 2022-02-17 DIAGNOSIS — H6123 Impacted cerumen, bilateral: Secondary | ICD-10-CM | POA: Diagnosis not present

## 2022-02-17 DIAGNOSIS — Z974 Presence of external hearing-aid: Secondary | ICD-10-CM | POA: Diagnosis not present

## 2022-02-17 DIAGNOSIS — H903 Sensorineural hearing loss, bilateral: Secondary | ICD-10-CM | POA: Diagnosis not present

## 2022-02-20 ENCOUNTER — Telehealth (INDEPENDENT_AMBULATORY_CARE_PROVIDER_SITE_OTHER): Payer: PPO | Admitting: Family Medicine

## 2022-02-20 ENCOUNTER — Telehealth: Payer: Self-pay | Admitting: *Deleted

## 2022-02-20 ENCOUNTER — Encounter: Payer: Self-pay | Admitting: Family Medicine

## 2022-02-20 VITALS — Temp 99.9°F | Ht 59.0 in

## 2022-02-20 DIAGNOSIS — K529 Noninfective gastroenteritis and colitis, unspecified: Secondary | ICD-10-CM

## 2022-02-20 NOTE — Telephone Encounter (Signed)
Message received from patient that she has been up urinating every hour last night and this morning started with diarrhea.  Call placed back to patient and patient notified per order of S. Eulas Post NP to contact her PCP for current symptoms.  Pt states that she will call her PCP office now.

## 2022-02-20 NOTE — Progress Notes (Signed)
Established Patient Office Visit  Subjective   Patient ID: [name], female    DOB: 1950-10-28  Age: 71 y.o. MRN: [id number]  Chief Complaint  Patient presents with   Diarrhea    Urine frequency, diarrhea, low grade temp symptoms x 3 days.     Diarrhea  Pertinent negatives include no abdominal pain, chills, fever or myalgias.   presents with a 2 to 3-day history of nausea, headache, elevated temperature to 99, watery diarrhea.  She is increased her fluid intake and has noted urinary frequency with that.  Urine has been clear.  There is been no dysuria or urgency.  She has prochlorperazine for nausea.  Home COVID test was negative.  Significant past medical history of metastatic small cell carcinoma.    Review of Systems  Constitutional:  Positive for malaise/fatigue. Negative for chills and fever.  HENT: Negative.    Eyes:  Negative for blurred vision, discharge and redness.  Respiratory: Negative.    Cardiovascular: Negative.   Gastrointestinal:  Positive for diarrhea and nausea. Negative for abdominal pain, blood in stool and melena.  Genitourinary:  Positive for frequency. Negative for dysuria, hematuria and urgency.  Musculoskeletal:  Positive for joint pain. Negative for myalgias.  Skin:  Negative for rash.  Neurological:  Negative for tingling, loss of consciousness and weakness.  Endo/Heme/Allergies:  Negative for polydipsia.      Objective:     Temp 99.9 F (37.7 C) (Temporal)   Ht 4\' 11"  (1.499 m)   BMI 24.08 kg/m    Physical Exam Constitutional:      General: She is not in acute distress.    Appearance: Normal appearance. She is not ill-appearing, toxic-appearing or diaphoretic.  HENT:     Head: Normocephalic and atraumatic.     Right Ear: External ear normal.     Left Ear: External ear normal.  Eyes:     General: No scleral icterus.       Right eye: No discharge.        Left eye: No discharge.     Extraocular Movements: Extraocular movements  intact.     Conjunctiva/sclera: Conjunctivae normal.  Pulmonary:     Effort: Pulmonary effort is normal. No respiratory distress.  Musculoskeletal:     Cervical back: No rigidity.  Skin:    General: Skin is warm and dry.  Neurological:     Mental Status: She is alert and oriented to person, place, and time.  Psychiatric:        Mood and Affect: Mood normal.        Behavior: Behavior normal.      No results found for any visits on 02/20/22.    The 10-year ASCVD risk score (Arnett DK, et al., 2019) is: 29.6%    Assessment & Plan:   Problem List Items Addressed This Visit       Digestive   Gastroenteritis - Primary    No follow-ups on file.  Continue fluid resuscitation with noncaffeinated beverages.  May use pain medicines as tolerated.  Continue med as needed for nausea.  Follow-up with primary if not improved by first of the week.  [name], MD  Virtual Visit via Video Note  I connected with Josephine A Forrester on 02/20/22 at  1:40 PM EST by a video enabled telemedicine application and verified that I am speaking with the correct person using two identifiers.  Location: Patient: home alone in a room.  Provider: work   I discussed  the limitations of evaluation and management by telemedicine and the availability of in person appointments. The patient expressed understanding and agreed to proceed.  History of Present Illness:    Observations/Objective:   Assessment and Plan:   Follow Up Instructions:    I discussed the assessment and treatment plan with the patient. The patient was provided an opportunity to ask questions and all were answered. The patient agreed with the plan and demonstrated an understanding of the instructions.   The patient was advised to call back or seek an in-person evaluation if the symptoms worsen or if the condition fails to improve as anticipated.  I provided 20 minutes of non-face-to-face time during this  encounter.   [name], MD

## 2022-02-25 ENCOUNTER — Other Ambulatory Visit: Payer: Self-pay

## 2022-02-25 ENCOUNTER — Encounter (HOSPITAL_BASED_OUTPATIENT_CLINIC_OR_DEPARTMENT_OTHER): Payer: Self-pay | Admitting: Emergency Medicine

## 2022-02-25 ENCOUNTER — Encounter (HOSPITAL_COMMUNITY): Payer: Self-pay

## 2022-02-25 ENCOUNTER — Emergency Department (HOSPITAL_BASED_OUTPATIENT_CLINIC_OR_DEPARTMENT_OTHER): Payer: PPO

## 2022-02-25 ENCOUNTER — Ambulatory Visit
Admission: EM | Admit: 2022-02-25 | Discharge: 2022-02-25 | Disposition: A | Discharge status: Home / Self Care | Payer: PPO

## 2022-02-25 ENCOUNTER — Encounter: Payer: Self-pay | Admitting: *Deleted

## 2022-02-25 ENCOUNTER — Inpatient Hospital Stay (HOSPITAL_BASED_OUTPATIENT_CLINIC_OR_DEPARTMENT_OTHER)
Admission: EM | Admit: 2022-02-25 | Discharge: 2022-03-12 | DRG: 175 | Disposition: A | Discharge status: Home Health | Payer: PPO | Attending: Internal Medicine | Admitting: Internal Medicine

## 2022-02-25 ENCOUNTER — Ambulatory Visit: Payer: Self-pay

## 2022-02-25 DIAGNOSIS — C3490 Malignant neoplasm of unspecified part of unspecified bronchus or lung: Secondary | ICD-10-CM | POA: Diagnosis present

## 2022-02-25 DIAGNOSIS — D75839 Thrombocytosis, unspecified: Secondary | ICD-10-CM | POA: Diagnosis not present

## 2022-02-25 DIAGNOSIS — J811 Chronic pulmonary edema: Secondary | ICD-10-CM | POA: Diagnosis not present

## 2022-02-25 DIAGNOSIS — R591 Generalized enlarged lymph nodes: Secondary | ICD-10-CM | POA: Diagnosis not present

## 2022-02-25 DIAGNOSIS — D649 Anemia, unspecified: Secondary | ICD-10-CM | POA: Diagnosis present

## 2022-02-25 DIAGNOSIS — Z9049 Acquired absence of other specified parts of digestive tract: Secondary | ICD-10-CM

## 2022-02-25 DIAGNOSIS — C787 Secondary malignant neoplasm of liver and intrahepatic bile duct: Secondary | ICD-10-CM | POA: Diagnosis present

## 2022-02-25 DIAGNOSIS — I2699 Other pulmonary embolism without acute cor pulmonale: Secondary | ICD-10-CM | POA: Diagnosis not present

## 2022-02-25 DIAGNOSIS — E119 Type 2 diabetes mellitus without complications: Secondary | ICD-10-CM | POA: Diagnosis not present

## 2022-02-25 DIAGNOSIS — R0902 Hypoxemia: Secondary | ICD-10-CM | POA: Diagnosis not present

## 2022-02-25 DIAGNOSIS — K5900 Constipation, unspecified: Secondary | ICD-10-CM | POA: Diagnosis present

## 2022-02-25 DIAGNOSIS — R509 Fever, unspecified: Secondary | ICD-10-CM

## 2022-02-25 DIAGNOSIS — K529 Noninfective gastroenteritis and colitis, unspecified: Secondary | ICD-10-CM

## 2022-02-25 DIAGNOSIS — D849 Immunodeficiency, unspecified: Secondary | ICD-10-CM | POA: Diagnosis present

## 2022-02-25 DIAGNOSIS — E876 Hypokalemia: Secondary | ICD-10-CM | POA: Diagnosis present

## 2022-02-25 DIAGNOSIS — C778 Secondary and unspecified malignant neoplasm of lymph nodes of multiple regions: Secondary | ICD-10-CM | POA: Diagnosis present

## 2022-02-25 DIAGNOSIS — K5909 Other constipation: Secondary | ICD-10-CM

## 2022-02-25 DIAGNOSIS — T380X5A Adverse effect of glucocorticoids and synthetic analogues, initial encounter: Secondary | ICD-10-CM | POA: Diagnosis present

## 2022-02-25 DIAGNOSIS — E279 Disorder of adrenal gland, unspecified: Secondary | ICD-10-CM | POA: Diagnosis present

## 2022-02-25 DIAGNOSIS — R0602 Shortness of breath: Secondary | ICD-10-CM | POA: Diagnosis not present

## 2022-02-25 DIAGNOSIS — Z66 Do not resuscitate: Secondary | ICD-10-CM | POA: Diagnosis present

## 2022-02-25 DIAGNOSIS — Z85118 Personal history of other malignant neoplasm of bronchus and lung: Secondary | ICD-10-CM | POA: Diagnosis not present

## 2022-02-25 DIAGNOSIS — E039 Hypothyroidism, unspecified: Secondary | ICD-10-CM | POA: Diagnosis present

## 2022-02-25 DIAGNOSIS — Y842 Radiological procedure and radiotherapy as the cause of abnormal reaction of the patient, or of later complication, without mention of misadventure at the time of the procedure: Secondary | ICD-10-CM | POA: Diagnosis present

## 2022-02-25 DIAGNOSIS — K449 Diaphragmatic hernia without obstruction or gangrene: Secondary | ICD-10-CM | POA: Diagnosis present

## 2022-02-25 DIAGNOSIS — Z794 Long term (current) use of insulin: Secondary | ICD-10-CM

## 2022-02-25 DIAGNOSIS — Z888 Allergy status to other drugs, medicaments and biological substances status: Secondary | ICD-10-CM

## 2022-02-25 DIAGNOSIS — I2693 Single subsegmental pulmonary embolism without acute cor pulmonale: Principal | ICD-10-CM

## 2022-02-25 DIAGNOSIS — I2609 Other pulmonary embolism with acute cor pulmonale: Secondary | ICD-10-CM

## 2022-02-25 DIAGNOSIS — Y95 Nosocomial condition: Secondary | ICD-10-CM | POA: Diagnosis present

## 2022-02-25 DIAGNOSIS — Z79899 Other long term (current) drug therapy: Secondary | ICD-10-CM

## 2022-02-25 DIAGNOSIS — Z8249 Family history of ischemic heart disease and other diseases of the circulatory system: Secondary | ICD-10-CM

## 2022-02-25 DIAGNOSIS — C7951 Secondary malignant neoplasm of bone: Secondary | ICD-10-CM | POA: Diagnosis present

## 2022-02-25 DIAGNOSIS — E1165 Type 2 diabetes mellitus with hyperglycemia: Secondary | ICD-10-CM | POA: Diagnosis present

## 2022-02-25 DIAGNOSIS — G4733 Obstructive sleep apnea (adult) (pediatric): Secondary | ICD-10-CM | POA: Diagnosis present

## 2022-02-25 DIAGNOSIS — J189 Pneumonia, unspecified organism: Secondary | ICD-10-CM | POA: Diagnosis not present

## 2022-02-25 DIAGNOSIS — R945 Abnormal results of liver function studies: Secondary | ICD-10-CM | POA: Diagnosis not present

## 2022-02-25 DIAGNOSIS — J439 Emphysema, unspecified: Secondary | ICD-10-CM | POA: Diagnosis present

## 2022-02-25 DIAGNOSIS — E1151 Type 2 diabetes mellitus with diabetic peripheral angiopathy without gangrene: Secondary | ICD-10-CM

## 2022-02-25 DIAGNOSIS — Z20822 Contact with and (suspected) exposure to covid-19: Secondary | ICD-10-CM | POA: Diagnosis present

## 2022-02-25 DIAGNOSIS — C3412 Malignant neoplasm of upper lobe, left bronchus or lung: Secondary | ICD-10-CM | POA: Diagnosis present

## 2022-02-25 DIAGNOSIS — Z0000 Encounter for general adult medical examination without abnormal findings: Secondary | ICD-10-CM

## 2022-02-25 DIAGNOSIS — R7989 Other specified abnormal findings of blood chemistry: Secondary | ICD-10-CM | POA: Diagnosis not present

## 2022-02-25 DIAGNOSIS — R220 Localized swelling, mass and lump, head: Secondary | ICD-10-CM | POA: Diagnosis present

## 2022-02-25 DIAGNOSIS — Z7984 Long term (current) use of oral hypoglycemic drugs: Secondary | ICD-10-CM

## 2022-02-25 DIAGNOSIS — J700 Acute pulmonary manifestations due to radiation: Secondary | ICD-10-CM | POA: Diagnosis present

## 2022-02-25 DIAGNOSIS — J9690 Respiratory failure, unspecified, unspecified whether with hypoxia or hypercapnia: Secondary | ICD-10-CM | POA: Diagnosis not present

## 2022-02-25 DIAGNOSIS — B349 Viral infection, unspecified: Secondary | ICD-10-CM | POA: Diagnosis present

## 2022-02-25 DIAGNOSIS — R918 Other nonspecific abnormal finding of lung field: Secondary | ICD-10-CM | POA: Diagnosis not present

## 2022-02-25 DIAGNOSIS — Z87891 Personal history of nicotine dependence: Secondary | ICD-10-CM

## 2022-02-25 DIAGNOSIS — E278 Other specified disorders of adrenal gland: Secondary | ICD-10-CM | POA: Insufficient documentation

## 2022-02-25 DIAGNOSIS — Z923 Personal history of irradiation: Secondary | ICD-10-CM

## 2022-02-25 DIAGNOSIS — R160 Hepatomegaly, not elsewhere classified: Secondary | ICD-10-CM | POA: Diagnosis present

## 2022-02-25 DIAGNOSIS — J9601 Acute respiratory failure with hypoxia: Secondary | ICD-10-CM | POA: Diagnosis present

## 2022-02-25 DIAGNOSIS — K838 Other specified diseases of biliary tract: Secondary | ICD-10-CM | POA: Diagnosis present

## 2022-02-25 DIAGNOSIS — E871 Hypo-osmolality and hyponatremia: Secondary | ICD-10-CM | POA: Diagnosis present

## 2022-02-25 DIAGNOSIS — R350 Frequency of micturition: Secondary | ICD-10-CM

## 2022-02-25 DIAGNOSIS — I700 Atherosclerosis of aorta: Secondary | ICD-10-CM | POA: Diagnosis not present

## 2022-02-25 DIAGNOSIS — I10 Essential (primary) hypertension: Secondary | ICD-10-CM | POA: Diagnosis present

## 2022-02-25 DIAGNOSIS — I2694 Multiple subsegmental pulmonary emboli without acute cor pulmonale: Secondary | ICD-10-CM | POA: Diagnosis not present

## 2022-02-25 DIAGNOSIS — Z79890 Hormone replacement therapy (postmenopausal): Secondary | ICD-10-CM

## 2022-02-25 DIAGNOSIS — L989 Disorder of the skin and subcutaneous tissue, unspecified: Secondary | ICD-10-CM

## 2022-02-25 DIAGNOSIS — Z86010 Personal history of colonic polyps: Secondary | ICD-10-CM

## 2022-02-25 LAB — CBC WITH DIFFERENTIAL/PLATELET
Abs Immature Granulocytes: 0.03 10*3/uL (ref 0.00–0.07)
Basophils Absolute: 0 10*3/uL (ref 0.0–0.1)
Basophils Relative: 0 %
Eosinophils Absolute: 0.1 10*3/uL (ref 0.0–0.5)
Eosinophils Relative: 1 %
HCT: 34.4 % — ABNORMAL LOW (ref 36.0–46.0)
Hemoglobin: 11.1 g/dL — ABNORMAL LOW (ref 12.0–15.0)
Immature Granulocytes: 1 %
Lymphocytes Relative: 11 %
Lymphs Abs: 0.7 10*3/uL (ref 0.7–4.0)
MCH: 28.9 pg (ref 26.0–34.0)
MCHC: 32.3 g/dL (ref 30.0–36.0)
MCV: 89.6 fL (ref 80.0–100.0)
Monocytes Absolute: 0.7 10*3/uL (ref 0.1–1.0)
Monocytes Relative: 11 %
Neutro Abs: 4.8 10*3/uL (ref 1.7–7.7)
Neutrophils Relative %: 76 %
Platelets: 241 10*3/uL (ref 150–400)
RBC: 3.84 MIL/uL — ABNORMAL LOW (ref 3.87–5.11)
RDW: 12.5 % (ref 11.5–15.5)
WBC: 6.2 10*3/uL (ref 4.0–10.5)
nRBC: 0 % (ref 0.0–0.2)

## 2022-02-25 LAB — COMPREHENSIVE METABOLIC PANEL
ALT: 20 U/L (ref 0–44)
AST: 21 U/L (ref 15–41)
Albumin: 3.2 g/dL — ABNORMAL LOW (ref 3.5–5.0)
Alkaline Phosphatase: 64 U/L (ref 38–126)
Anion gap: 10 (ref 5–15)
BUN: 16 mg/dL (ref 8–23)
CO2: 27 mmol/L (ref 22–32)
Calcium: 8.5 mg/dL — ABNORMAL LOW (ref 8.9–10.3)
Chloride: 93 mmol/L — ABNORMAL LOW (ref 98–111)
Creatinine, Ser: 0.75 mg/dL (ref 0.44–1.00)
GFR, Estimated: >60 mL/min (ref >60)
Glucose, Bld: 462 mg/dL — ABNORMAL HIGH (ref 70–99)
Potassium: 4.1 mmol/L (ref 3.5–5.1)
Sodium: 130 mmol/L — ABNORMAL LOW (ref 135–145)
Total Bilirubin: 0.7 mg/dL (ref 0.3–1.2)
Total Protein: 8 g/dL (ref 6.5–8.1)

## 2022-02-25 LAB — URINALYSIS, ROUTINE W REFLEX MICROSCOPIC
Bilirubin Urine: NEGATIVE
Glucose, UA: >=500 mg/dL — AB
Hgb urine dipstick: NEGATIVE
Ketones, ur: 15 mg/dL — AB
Leukocytes,Ua: NEGATIVE
Nitrite: NEGATIVE
Protein, ur: NEGATIVE mg/dL
Specific Gravity, Urine: <=1.005 (ref 1.005–1.030)
pH: 5.5 (ref 5.0–8.0)

## 2022-02-25 LAB — URINALYSIS, MICROSCOPIC (REFLEX): RBC / HPF: NONE SEEN RBC/hpf (ref 0–5)

## 2022-02-25 LAB — RESP PANEL BY RT-PCR (FLU A&B, COVID) ARPGX2
Influenza A by PCR: NEGATIVE
Influenza B by PCR: NEGATIVE
SARS Coronavirus 2 by RT PCR: NEGATIVE

## 2022-02-25 LAB — LACTIC ACID, PLASMA: Lactic Acid, Venous: 2.1 mmol/L (ref 0.5–1.9)

## 2022-02-25 MED ORDER — SODIUM CHLORIDE 0.9 % IV SOLN
500.0000 mg | Freq: Once | INTRAVENOUS | Status: AC
  Administered 2022-02-25: 500 mg via INTRAVENOUS
  Filled 2022-02-25: qty 5

## 2022-02-25 MED ORDER — SODIUM CHLORIDE 0.9 % IV SOLN: INTRAVENOUS | Status: DC

## 2022-02-25 MED ORDER — IOHEXOL 350 MG/ML SOLN
75.0000 mL | Freq: Once | INTRAVENOUS | Status: AC | PRN
  Administered 2022-02-25: 75 mL via INTRAVENOUS

## 2022-02-25 MED ORDER — HYDROMORPHONE HCL 1 MG/ML IJ SOLN
1.0000 mg | Freq: Once | INTRAMUSCULAR | Status: AC
  Administered 2022-02-25: 1 mg via INTRAVENOUS
  Filled 2022-02-25: qty 1

## 2022-02-25 MED ORDER — SODIUM CHLORIDE 0.9 % IV SOLN
1.0000 g | Freq: Once | INTRAVENOUS | Status: AC
  Administered 2022-02-25: 1 g via INTRAVENOUS
  Filled 2022-02-25: qty 10

## 2022-02-25 MED ORDER — HEPARIN (PORCINE) 25000 UT/250ML-% IV SOLN
950.0000 [IU]/h | INTRAVENOUS | Status: DC
  Administered 2022-02-25: 950 [IU]/h via INTRAVENOUS
  Filled 2022-02-25: qty 250

## 2022-02-25 MED ORDER — HEPARIN BOLUS VIA INFUSION
3200.0000 [IU] | Freq: Once | INTRAVENOUS | Status: AC
  Administered 2022-02-25: 3200 [IU] via INTRAVENOUS

## 2022-02-25 MED ORDER — HEPARIN (PORCINE) 25000 UT/250ML-% IV SOLN: 14.0000 [IU]/kg/h | INTRAVENOUS | Status: DC

## 2022-02-25 MED ORDER — HEPARIN SODIUM (PORCINE) 5000 UNIT/ML IJ SOLN
60.0000 [IU]/kg | Freq: Once | INTRAMUSCULAR | Status: DC
  Filled 2022-02-25: qty 1

## 2022-02-25 NOTE — ED Triage Notes (Addendum)
C/O polyuria, urinary urgency, but denies any dysuria. C/O thirst, weakness, general malaise. Pt reports hx stage 4 lung cancer. C/O fevers up to 101 with chills. Has been taking Tyl - last dose @ 1330. States had virtual appt 2 days ago - MD thought she may have influenza. Denies cough, congestion or dyspnea.

## 2022-02-25 NOTE — Progress Notes (Signed)
Plan of Care Note for accepted transfer   Patient: [name] MRN: [id number]   DOA: 02/25/2022  Facility requesting transfer: [facility] ED Requesting Provider: Dr. Rogene Houston, [place] Reason for transfer: Acute hypoxic respiratory failure.  Facility course: The patient is a 71 year old female with history of small cell lung cancer with mets to mediastinum, liver, last radiation was on 01/08/2022 followed by Dr. Marin Olp, chronic pain syndrome, type 2 diabetes, hypothyroidism, GERD, chronic anxiety/depression, who initially presented to urgent care due to feeling poorly for the past 5 days.  Associated with subjective fevers, chills, body aches, diarrhea for the past 3 days.  Denies cough.  While in urgent care today, she was noted to be hypoxic with O2 saturation of 85% on room air.  Dyspneic with exertion.  She was advised to go to the ED for further evaluation.  To note, the patient had a televisit on Monday, 3 days ago, where she was diagnosed with flulike illness.  Endorses intermittent subjective fevers and chills for the past 5 days.  In the ED, CT angio chest returned positive for pulmonary embolism in right upper lung distal segmental and subsegmental vessels, without CT evidence of right heart strain.  Also noted on CT scan developing patchy airspace and interstitial infiltrates with concern for multifocal pneumonia versus possible radiation pneumonitis.    Started on heparin drip, empiric IV antibiotics for CAP coverage.  Hypoxia improved on 2 L nasal cannula.  No Hypotension or tachycardia.  Afebrile with no leukocytosis.  Due to concern for possible radiation pneumonitis, EDP discussed the case with PCCM who will see in consultation.  EDP also discussed the case with medical oncology, Dr. Alvy Bimler, who will see in consultation.  Plan of care: The patient is accepted for admission to Progressive unit, at [facility] as inpatient status.    Author: Kayleen Memos,  DO 02/25/2022  Check [email or web address] for on-call coverage.  Nursing staff, Please call [place] number on Amion as soon as patient's arrival, so appropriate admitting provider can evaluate the pt.

## 2022-02-25 NOTE — Progress Notes (Signed)
ANTICOAGULATION CONSULT NOTE - Initial Consult  Pharmacy Consult for heparin Indication: pulmonary embolus  Allergies  Allergen Reactions   Lyrica [Pregabalin] Other (See Comments)    Hallucinations    Crestor [Rosuvastatin Calcium] Other (See Comments)    Leg cramps    Patient Measurements: Height: 4' 11.5" (151.1 cm) Weight: 53.1 kg (117 lb) IBW/kg (Calculated) : 44.35 Heparin Dosing Weight: 53.1 kg  Vital Signs: Temp: 98.2 F (36.8 C) (11/22 2006) Temp Source: Oral (11/22 2006) BP: 114/75 (11/22 1915) Pulse Rate: 96 (11/22 1915)  Labs: Recent Labs    02/25/22 1656  HGB 11.1*  HCT 34.4*  PLT 241  CREATININE 0.75    Estimated Creatinine Clearance: 45.2 mL/min (by C-G formula based on SCr of 0.75 mg/dL).   Medical History: Past Medical History:  Diagnosis Date   Arthritis    Cancer ([place])    Diabetes mellitus    Esophagitis    Goals of care, counseling/discussion 05/23/2021   Hiatal hernia    History of radiation therapy    Left Lung- 11/27/21-01/08/22- Dr. 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 Has history of stage IV lung cancer. CT scan shows right-sided pulmonary embolism. No history of AC PTA. Pharmacy has been consulted to dose heparin.  Hgb 11.1, platelets are WNL.   Goal of Therapy:  Heparin level 0.3-0.7 units/ml Monitor platelets by anticoagulation protocol: Yes   Plan:  Give heparin 3200 units x1 Start heparin infusion at 950 units/hour Check 8-hour heparin level Monitor daily heparin levels, CBC,  and signs/symptoms of bleeding  Louanne Belton, PharmD, [facility] PGY1 Pharmacy Resident 02/25/2022 8:19 PM

## 2022-02-25 NOTE — ED Notes (Signed)
Attempted IV 2x, could get blood off LAC.

## 2022-02-25 NOTE — ED Notes (Signed)
Per MD Rogene Houston it is okay for pt to eat; pt eating her happy meal from [employer].

## 2022-02-25 NOTE — ED Notes (Signed)
Patient ambulated from lobby to triage on r/a SpO2 89-92%, denied increased DOE/WOB.

## 2022-02-25 NOTE — ED Triage Notes (Signed)
Dx with Flu on Monday. C/ fatigue and back pain. Pt hypoxic in triage (85% on RA at rest)

## 2022-02-25 NOTE — ED Notes (Signed)
Patient is being discharged from the Urgent Care and sent to the Emergency Department via private vehicle with spouse . Per L. Lilia Pro, [place], patient is in need of higher level of care due to hypoxia, general malaise, hx stage 4 lung cancer. Patient is aware and verbalizes understanding of plan of care.  Vitals:   02/25/22 1528 02/25/22 1529  BP: 111/72   Pulse: 96   Resp: 18   Temp: 98 F (36.7 C)   SpO2: (!) 89% 93%

## 2022-02-25 NOTE — ED Provider Notes (Signed)
Patient complains of increased frequency of urination, increased urge to urinate but denies burning with urination.  Patient states she also feels very thirsty, weak and generally very tired.  Patient states she has a history of stage IV lung cancer, states that she is initially diagnosed in February her oncologist gave her 1 month to live if she decided not to pursue treatment.  Patient states she has had radiation and continues to have chemotherapy.  Patient also has a history of hypothyroid and insulin-dependent type 2 diabetes mellitus.  Patient states she has been having fever as high as 101 degrees and chills for the past 2 days.  Patient states she had a virtual appointment with her primary care provider few days ago who told her she might have influenza.  Patient states she has not had any cough or congestion at this time.  Patient is afebrile on arrival today but upon entering the exam room, her oxygen saturation was at 89%.    Patient advised that given her long list of comorbidities, stage IV lung cancer and decreased oxygen saturation level I recommend that she go to the emergency room for further, more emergent evaluation as our viral testing and that testing may not be complete for another 12 to 24 hours.  Patient was agreeable to going now.  Patient is accompanied by her husband today who agrees to take her by private vehicle.   Lynden Oxford Scales, [place] 02/25/22 [phone]

## 2022-02-25 NOTE — ED Notes (Signed)
No 2nd LA due to no leukocytosis and not meeting sepsis criteria.

## 2022-02-25 NOTE — ED Notes (Signed)
Previous shift states pt did not need 2nd set of blood cultures.

## 2022-02-25 NOTE — Progress Notes (Addendum)
02/25/2022 Case reviewed with Dr. Rogene Houston. Viral symptoms and hypoxemia. Small PE on scan as well as GGO IVb small cell lung cancer on chemoradiation Nontoxic appearing Started on CAP tx Flu/COVID neg RVP to be sent  Labs okay except hyperglycemia  Okay for [facility] alone, I would just start eliquis  Will consult formally in AM to see if any role for steroids ?radiation pneumonitis.  Erskine Emery MD PCCM

## 2022-02-25 NOTE — ED Provider Notes (Addendum)
[facility] Provider Note   CSN: [id number] Arrival date & time: 02/25/22  1549     History  Chief Complaint  Patient presents with   Fatigue    [name] is a 71 y.o. female.  Patient sent in from urgent care for hypoxia.  Patient's oxygen saturations here 85% on room air at rest.  Patient through a virtual visit was diagnosed with flu on Monday.  Patient's symptoms started on Friday.  She had chills fever no cough body aches mild sore throat on Sunday had some diarrhea.  That is resolved.  No nausea or vomiting.  No cough or congestion.  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  Just completed a course of radiation about 3 weeks ago.  Not currently getting any chemo treatments.  Past surgical history significant for cholecystectomy abdominal hysterectomy.  Patient former smoker quit 2018.       Home Medications Prior to Admission medications   Medication Sig Start Date End Date Taking? Authorizing Provider  acetaminophen (TYLENOL) 500 MG tablet Take 1,000 mg by mouth every 6 (six) hours as needed for moderate pain.    [provider]  albuterol (VENTOLIN HFA) 108 (90 Base) MCG/ACT inhaler Inhale 2 puffs into the lungs every 6 (six) hours as needed for wheezing or shortness of breath. Patient not taking: Reported on 02/20/2022 04/02/20   Colon Branch, MD  ALPRAZolam Duanne Moron) 0.5 MG tablet TAKE ONE TABLET BY MOUTH THREE TIMES A DAY AS NEEDED 02/06/22   Roma Schanz R, DO  blood glucose meter kit and supplies KIT Use daily 05/19/21   Carollee Herter, Alferd Apa, DO  Blood Glucose Monitoring Suppl (ONE TOUCH ULTRA 2) w/Device KIT Check blood sugar as directed. Dx:E11.65 09/26/18   Roma Schanz R, DO  Blood Glucose Monitoring Suppl (ONETOUCH VERIO FLEX SYSTEM) w/Device KIT USE AS DIRECTED TO CHECK BLOOD SUGAR FOUR TIMES A  DAY 11/30/19   Carollee Herter, Alferd Apa, DO  chlorhexidine (PERIDEX) 0.12 % solution Use as directed 15 mLs in the mouth or throat 2 (two) times daily. Patient not taking: Reported on 11/19/2021 08/18/21   Celso Amy, NP  fentaNYL (DURAGESIC) 12 MCG/HR Place 1 patch onto the skin every 3 (three) days. Patient not taking: Reported on 11/19/2021 06/06/21   [name], MD  FLUoxetine (PROZAC) 20 MG capsule Take 1 capsule (20 mg total) by mouth daily. 02/21/21   Roma Schanz R, DO  glucose blood (ONETOUCH ULTRA) test strip USE TO CHECK BLOOD SUGAR FOUR TIMES A DAY AS DIRECTED 11/01/20   Carollee Herter, Alferd Apa, DO  insulin glargine (LANTUS SOLOSTAR) 100 UNIT/ML Solostar Pen Inject 30 Units into the skin daily. HS 06/02/21   Collene Gobble, MD  Insulin Pen Needle (NOVOTWIST) 32G X 5 MM MISC Use daily at Bedtime with Lantus 02/17/12   Carollee Herter, Alferd Apa, DO  levothyroxine (SYNTHROID) 125 MCG tablet Take 1 tablet (125 mcg total) by mouth daily before breakfast. 10/09/21   [name], MD  meloxicam (MOBIC) 15 MG tablet Take 1 tablet (15 mg total) by mouth daily. 02/02/22   [name], DO  metFORMIN (GLUCOPHAGE-XR) 500 MG 24 hr tablet Take 1 tablet (500 mg total) by mouth in the morning and at bedtime. 01/12/22   Mosie Lukes, MD  omeprazole (PRILOSEC) 20 MG  capsule Take 1 capsule (20 mg total) by mouth daily. 09/02/21   [name], DO  oxyCODONE ER (XTAMPZA ER) 13.5 MG C12A Take 13.5 mg by mouth 2 (two) times daily. Patient not taking: Reported on 02/20/2022 12/11/21   [name], MD  oxyCODONE-acetaminophen (PERCOCET) 7.5-325 MG tablet Take 1 tablet by mouth every 6 (six) hours as needed for severe pain. 02/16/22   [name], MD  sucralfate (CARAFATE) 1 g tablet Take 1 tablet (1 g total) by mouth 4 (four) times daily -  with meals and at bedtime. Crush and dissolve in 10 mL's of warm water prior to swallowing Patient not taking: Reported on 02/20/2022 12/16/21    Gery Pray, MD  TRULICITY 6.96 EX/5.2WU SOPN Inject 0.75 mg into the skin every Saturday. 03/14/21   [provider]  UNABLE TO FIND Please provide hair prothesis for patient undergoing chemotherapy for cancer diagnosis. 07/02/21   [name], MD  valACYclovir (VALTREX) 1000 MG tablet TAKE ONE TABLET BY MOUTH DAILY AS NEEDED 03/27/20   Carollee Herter, Alferd Apa, DO  prochlorperazine (COMPAZINE) 10 MG tablet Take 1 tablet (10 mg total) by mouth every 6 (six) hours as needed (Nausea or vomiting). Patient not taking: Reported on 11/14/2021 06/09/21 11/14/21  [name], MD      Allergies    Lyrica [pregabalin] and Crestor [rosuvastatin calcium]    Review of Systems   Review of Systems  Constitutional:  Positive for chills and fever.  HENT:  Positive for sore throat. Negative for congestion and rhinorrhea.   Eyes:  Negative for visual disturbance.  Respiratory:  Positive for shortness of breath. Negative for cough.   Cardiovascular:  Negative for chest pain and leg swelling.  Gastrointestinal:  Positive for diarrhea. Negative for abdominal pain, nausea and vomiting.  Genitourinary:  Negative for dysuria.  Musculoskeletal:  Positive for myalgias. Negative for back pain and neck pain.  Skin:  Negative for rash.  Neurological:  Negative for dizziness, light-headedness and headaches.  Hematological:  Does not bruise/bleed easily.  Psychiatric/Behavioral:  Negative for confusion.     Physical Exam Updated Vital Signs BP 114/75   Pulse 96   Temp 98.2 F (36.8 C) (Oral)   Resp 20   Ht 1.511 m (4' 11.5")   Wt 53.1 kg   SpO2 99%   BMI 23.24 kg/m  Physical Exam Vitals and nursing note reviewed.  Constitutional:      General: She is not in acute distress.    Appearance: Normal appearance. She is well-developed.  HENT:     Head: Normocephalic and atraumatic.     Mouth/Throat:     Mouth: Mucous membranes are dry.     Comments: Mucous membranes slightly dry. Eyes:      Extraocular Movements: Extraocular movements intact.     Conjunctiva/sclera: Conjunctivae normal.     Pupils: Pupils are equal, round, and reactive to light.  Cardiovascular:     Rate and Rhythm: Normal rate and regular rhythm.     Heart sounds: No murmur heard. Pulmonary:     Effort: Pulmonary effort is normal. No respiratory distress.     Breath sounds: Normal breath sounds. No stridor. No wheezing, rhonchi or rales.     Comments: Port-A-Cath right anterior chest area Chest:     Chest wall: No tenderness.  Abdominal:     Palpations: Abdomen is soft.     Tenderness: There is no abdominal tenderness.  Musculoskeletal:  General: No swelling.     Cervical back: Normal range of motion and neck supple. No rigidity.  Skin:    General: Skin is warm and dry.     Capillary Refill: Capillary refill takes less than 2 seconds.  Neurological:     General: No focal deficit present.     Mental Status: She is alert and oriented to person, place, and time.     Cranial Nerves: No cranial nerve deficit.     Sensory: No sensory deficit.     Motor: No weakness.  Psychiatric:        Mood and Affect: Mood normal.     ED Results / Procedures / Treatments   Labs (all labs ordered are listed, but only abnormal results are displayed) Labs Reviewed  CBC WITH DIFFERENTIAL/PLATELET - Abnormal; Notable for the following components:      Result Value   RBC 3.84 (*)    Hemoglobin 11.1 (*)    HCT 34.4 (*)    All other components within normal limits  COMPREHENSIVE METABOLIC PANEL - Abnormal; Notable for the following components:   Sodium 130 (*)    Chloride 93 (*)    Glucose, Bld 462 (*)    Calcium 8.5 (*)    Albumin 3.2 (*)    All other components within normal limits  LACTIC ACID, PLASMA - Abnormal; Notable for the following components:   Lactic Acid, Venous 2.1 (*)    All other components within normal limits  URINALYSIS, ROUTINE W REFLEX MICROSCOPIC - Abnormal; Notable for the following  components:   Glucose, UA >=500 (*)    Ketones, ur 15 (*)    All other components within normal limits  URINALYSIS, MICROSCOPIC (REFLEX) - Abnormal; Notable for the following components:   Bacteria, UA RARE (*)    All other components within normal limits  RESP PANEL BY RT-PCR (FLU A&B, COVID) [id number]  CULTURE, BLOOD (ROUTINE X 2)  CULTURE, BLOOD (ROUTINE X 2)  LACTIC ACID, PLASMA    EKG EKG Interpretation  Date/Time:  Wednesday February 25 2022 16:06:49 EST Ventricular Rate:  92 PR Interval:  144 QRS Duration: 91 QT Interval:  373 QTC Calculation: 462 R Axis:   73 Text Interpretation: Sinus rhythm Confirmed by Fredia Sorrow [phone]) on 02/25/2022 4:09:09 PM  Radiology CT Angio Chest PE W/Cm &/Or Wo Cm  Result Date: 02/25/2022 CLINICAL DATA:  Pulmonary embolus suspected with high probability. EXAM: CT ANGIOGRAPHY CHEST WITH CONTRAST TECHNIQUE: Multidetector CT imaging of the chest was performed using the standard protocol during bolus administration of intravenous contrast. Multiplanar CT image reconstructions and MIPs were obtained to evaluate the vascular anatomy. RADIATION DOSE REDUCTION: This exam was performed according to the departmental dose-optimization program which includes automated exposure control, adjustment of the mA and/or kV according to patient size and/or use of iterative reconstruction technique. CONTRAST:  30m OMNIPAQUE IOHEXOL 350 MG/ML SOLN COMPARISON:  PET-CT 11/03/2021.  CT chest 05/14/2021 FINDINGS: Cardiovascular: Good opacification of the central and segmental pulmonary arteries with technically adequate study. Intraluminal filling defects are demonstrated in segmental and subsegmental right upper lobe pulmonary arteries. This is consistent with acute nonocclusive pulmonary embolus. No additional emboli are demonstrated. RA to RV ratio is normal suggesting no evidence of heart strain. Normal heart size. No pericardial effusions. Normal caliber thoracic  aorta. No aortic dissection. Great vessel origins are patent. Mediastinum/Nodes: Thyroid gland is unremarkable. Central venous catheter with tip in the cavoatrial junction. Esophagus is decompressed. Small esophageal hiatal hernia. Lymphadenopathy in  the mediastinum with pretracheal lymph nodes measuring up to 1.3 cm in diameter. Lymphadenopathy is similar to previous PET-CT and decreased since prior CT. Lungs/Pleura: Emphysematous changes in the lungs. Patchy interstitial and ground-glass infiltrates demonstrated in both lungs. Changes are new since previous studies, likely indicating multifocal pneumonia. Edema would be less likely to have this distribution. There is peribronchial thickening with mild bronchiectasis. No pleural effusions. No pneumothorax. Upper Abdomen: Surgical absence of the gallbladder. Suggestion of prominent periaortic lymph nodes but incompletely included within the field of view. Nodes measure up to 1.1 cm diameter. Abdominal lymphadenopathy may be progressing since previous study. Musculoskeletal: Degenerative changes in the spine. Postoperative changes in the cervical spine. Multiple mixed sclerotic and lucent lesions throughout the thoracic vertebrae consistent with metastatic disease. Similar appearance to previous studies. Review of the MIP images confirms the above findings. IMPRESSION: 1. Positive examination for pulmonary emboli demonstrated focally in right upper lung distal segmental and subsegmental vessels. 2. Developing patchy airspace and interstitial infiltrates in the lungs most likely representing multifocal pneumonia. 3. Prominent mediastinal and abdominal lymph nodes, likely metastatic. 4. Multiple bone metastases again demonstrated. Critical Value/emergent results were called by telephone at the time of interpretation on 02/25/2022 at 7:45 pm to provider Fredia Sorrow , who verbally acknowledged these results. Electronically Signed   By: Lucienne Capers M.D.   On:  02/25/2022 19:53   DG Chest Port 1 View  Result Date: 02/25/2022 CLINICAL DATA:  Shortness of breath EXAM: PORTABLE CHEST 1 VIEW COMPARISON:  Chest x-ray July 25, 2020. FINDINGS: Chronic streaky bibasilar opacities. No confluent consolidation. No visible pleural effusions or pneumothorax. Right IJ approach Port-A-Cath with the tip projecting at the right atrium. Cardiomediastinal silhouette is unchanged. Partially imaged cervical fusion hardware. IMPRESSION: Chronic streaky bibasilar opacities, which may represent atelectasis/scar given unchanged appearance. Superimposed atypical infection is difficult to exclude. No confluent consolidation. Electronically Signed   By: Margaretha Sheffield M.D.   On: 02/25/2022 16:17    Procedures Procedures    Medications Ordered in ED Medications  0.9 %  sodium chloride infusion ( Intravenous New Bag/Given 02/25/22 1721)  cefTRIAXone (ROCEPHIN) 1 g in sodium chloride 0.9 % 100 mL IVPB (1 g Intravenous New Bag/Given 02/25/22 2018)  azithromycin (ZITHROMAX) 500 mg in sodium chloride 0.9 % 250 mL IVPB (has no administration in time range)  heparin injection 3,200 Units (has no administration in time range)  heparin ADULT infusion 100 units/mL (25000 units/255m) (has no administration in time range)  iohexol (OMNIPAQUE) 350 MG/ML injection 75 mL (75 mLs Intravenous Contrast Given 02/25/22 1942)    ED Course/ Medical Decision Making/ A&P                           Medical Decision Making Amount and/or Complexity of Data Reviewed Labs: ordered. Radiology: ordered.  Risk Prescription drug management. Decision regarding hospitalization.   Patient without any significant upper respiratory infection symptoms other than the sore throat.  Patient not formally tested for COVID or for flu.  Chest x-ray here chronic streaky bibasilar opacities which may represent atelectasis or scar given on change appearance of superimposed atypical infection difficult to rule  out.  Definitely hypoxic here on 2 L of oxygen oxygen sats are good.  Patient does not use oxygen at home.  Will check basic labs.  Urinalysis check for COVID and flu.  Will need CT chest but will wait for electrolytes to see if we can do CT angio  to rule out PE as well.  With her history of the small cell lung CA currently under treatment.  Patient's labs no leukocytosis white blood cell count is 6.2 hemoglobin 11.1 platelets good at 241.  Complete metabolic panel significant for sodium down a little bit at 130 chloride 93 glucose 462 renal function good though GFR greater than 60 liver function tests normal.  Anion gap normal.  Patient's lactic acid elevated at 2.1 but patient without a leukocytosis patient without a fever.  Patient without any hypotension.  So not meeting sepsis criteria otherwise.  COVID influenza testing negative.  Urinalysis negative for urinary tract infection.  Will get CT chest angio rule out PE and get a better look at her lungs to see if there is any hidden infiltrates.  CRITICAL CARE Performed by: Fredia Sorrow Total critical care time: 45 minutes Critical care time was exclusive of separately billable procedures and treating other patients. Critical care was necessary to treat or prevent imminent or life-threatening deterioration. Critical care was time spent personally by me on the following activities: development of treatment plan with patient and/or surrogate as well as nursing, discussions with consultants, evaluation of patient's response to treatment, examination of patient, obtaining history from patient or surrogate, ordering and performing treatments and interventions, ordering and review of laboratory studies, ordering and review of radiographic studies, pulse oximetry and re-evaluation of patient's condition.  CT shows evidence of right upper lobe PE.  No right heart strain.  Also still thinks that there is evidence of patchy airspace disease multifocal  pneumonia.  The patient has had radiation treatment.  Does not really have a cough does not have a fever but we will start her on antibiotics Rocephin and azithromycin.  Also shows evidence of prominent mediastinal lymph nodes likely metastatic and multiple bone metastasis.  Will make a hematology oncology aware of the findings and that she will need admission.  And will contact hospitalist for admission.  Patient currently very stable on the 2 L of oxygen.   Discussed with critical care they will see patient in consultation tomorrow.  Feels that patient stable currently for hospitalist admission.   Final Clinical Impression(s) / ED Diagnoses Final diagnoses:  Hypoxia  Viral syndrome  Single subsegmental pulmonary embolism without acute cor pulmonale (HCC)  Multifocal pneumonia    Rx / DC Orders ED Discharge Orders     None         Fredia Sorrow, MD 02/25/22 1645    Fredia Sorrow, MD 02/25/22 1757    Fredia Sorrow, MD 02/25/22 2025    Fredia Sorrow, MD 02/25/22 2043

## 2022-02-26 ENCOUNTER — Inpatient Hospital Stay (HOSPITAL_COMMUNITY): Payer: PPO

## 2022-02-26 ENCOUNTER — Telehealth: Payer: Self-pay | Admitting: Pulmonary Disease

## 2022-02-26 DIAGNOSIS — C3490 Malignant neoplasm of unspecified part of unspecified bronchus or lung: Secondary | ICD-10-CM | POA: Diagnosis not present

## 2022-02-26 DIAGNOSIS — E871 Hypo-osmolality and hyponatremia: Secondary | ICD-10-CM | POA: Diagnosis present

## 2022-02-26 DIAGNOSIS — C778 Secondary and unspecified malignant neoplasm of lymph nodes of multiple regions: Secondary | ICD-10-CM | POA: Diagnosis present

## 2022-02-26 DIAGNOSIS — J9601 Acute respiratory failure with hypoxia: Secondary | ICD-10-CM | POA: Diagnosis present

## 2022-02-26 DIAGNOSIS — E279 Disorder of adrenal gland, unspecified: Secondary | ICD-10-CM | POA: Diagnosis present

## 2022-02-26 DIAGNOSIS — C3412 Malignant neoplasm of upper lobe, left bronchus or lung: Secondary | ICD-10-CM | POA: Diagnosis present

## 2022-02-26 DIAGNOSIS — Z794 Long term (current) use of insulin: Secondary | ICD-10-CM

## 2022-02-26 DIAGNOSIS — E119 Type 2 diabetes mellitus without complications: Secondary | ICD-10-CM | POA: Diagnosis not present

## 2022-02-26 DIAGNOSIS — E039 Hypothyroidism, unspecified: Secondary | ICD-10-CM

## 2022-02-26 DIAGNOSIS — J189 Pneumonia, unspecified organism: Secondary | ICD-10-CM | POA: Diagnosis not present

## 2022-02-26 DIAGNOSIS — B349 Viral infection, unspecified: Secondary | ICD-10-CM | POA: Diagnosis present

## 2022-02-26 DIAGNOSIS — Z20822 Contact with and (suspected) exposure to covid-19: Secondary | ICD-10-CM | POA: Diagnosis present

## 2022-02-26 DIAGNOSIS — K5909 Other constipation: Secondary | ICD-10-CM | POA: Diagnosis present

## 2022-02-26 DIAGNOSIS — G4733 Obstructive sleep apnea (adult) (pediatric): Secondary | ICD-10-CM | POA: Diagnosis present

## 2022-02-26 DIAGNOSIS — Y842 Radiological procedure and radiotherapy as the cause of abnormal reaction of the patient, or of later complication, without mention of misadventure at the time of the procedure: Secondary | ICD-10-CM | POA: Diagnosis present

## 2022-02-26 DIAGNOSIS — I2699 Other pulmonary embolism without acute cor pulmonale: Secondary | ICD-10-CM | POA: Insufficient documentation

## 2022-02-26 DIAGNOSIS — J439 Emphysema, unspecified: Secondary | ICD-10-CM | POA: Diagnosis present

## 2022-02-26 DIAGNOSIS — I2693 Single subsegmental pulmonary embolism without acute cor pulmonale: Secondary | ICD-10-CM

## 2022-02-26 DIAGNOSIS — D849 Immunodeficiency, unspecified: Secondary | ICD-10-CM | POA: Diagnosis present

## 2022-02-26 DIAGNOSIS — J700 Acute pulmonary manifestations due to radiation: Secondary | ICD-10-CM | POA: Diagnosis present

## 2022-02-26 DIAGNOSIS — Z66 Do not resuscitate: Secondary | ICD-10-CM | POA: Diagnosis present

## 2022-02-26 DIAGNOSIS — K5900 Constipation, unspecified: Secondary | ICD-10-CM | POA: Diagnosis present

## 2022-02-26 DIAGNOSIS — K838 Other specified diseases of biliary tract: Secondary | ICD-10-CM | POA: Diagnosis present

## 2022-02-26 DIAGNOSIS — D649 Anemia, unspecified: Secondary | ICD-10-CM | POA: Diagnosis present

## 2022-02-26 DIAGNOSIS — C7951 Secondary malignant neoplasm of bone: Secondary | ICD-10-CM | POA: Diagnosis present

## 2022-02-26 DIAGNOSIS — C787 Secondary malignant neoplasm of liver and intrahepatic bile duct: Secondary | ICD-10-CM | POA: Diagnosis present

## 2022-02-26 DIAGNOSIS — I10 Essential (primary) hypertension: Secondary | ICD-10-CM | POA: Diagnosis present

## 2022-02-26 DIAGNOSIS — E1165 Type 2 diabetes mellitus with hyperglycemia: Secondary | ICD-10-CM | POA: Diagnosis present

## 2022-02-26 DIAGNOSIS — Y95 Nosocomial condition: Secondary | ICD-10-CM | POA: Diagnosis present

## 2022-02-26 DIAGNOSIS — K449 Diaphragmatic hernia without obstruction or gangrene: Secondary | ICD-10-CM | POA: Diagnosis present

## 2022-02-26 DIAGNOSIS — E876 Hypokalemia: Secondary | ICD-10-CM | POA: Insufficient documentation

## 2022-02-26 LAB — RENAL FUNCTION PANEL
Albumin: 2.9 g/dL — ABNORMAL LOW (ref 3.5–5.0)
Anion gap: 6 (ref 5–15)
BUN: 11 mg/dL (ref 8–23)
CO2: 26 mmol/L (ref 22–32)
Calcium: 8.3 mg/dL — ABNORMAL LOW (ref 8.9–10.3)
Chloride: 102 mmol/L (ref 98–111)
Creatinine, Ser: 0.54 mg/dL (ref 0.44–1.00)
GFR, Estimated: >60 mL/min (ref >60)
Glucose, Bld: 236 mg/dL — ABNORMAL HIGH (ref 70–99)
Phosphorus: 1.7 mg/dL — ABNORMAL LOW (ref 2.5–4.6)
Potassium: 3.4 mmol/L — ABNORMAL LOW (ref 3.5–5.1)
Sodium: 134 mmol/L — ABNORMAL LOW (ref 135–145)

## 2022-02-26 LAB — T4, FREE: Free T4: 1.28 ng/dL — ABNORMAL HIGH (ref 0.61–1.12)

## 2022-02-26 LAB — NA AND K (SODIUM & POTASSIUM), RAND UR
Potassium Urine: 27 mmol/L
Sodium, Ur: 142 mmol/L

## 2022-02-26 LAB — CBC
HCT: 29.1 % — ABNORMAL LOW (ref 36.0–46.0)
HCT: 30 % — ABNORMAL LOW (ref 36.0–46.0)
Hemoglobin: 9.3 g/dL — ABNORMAL LOW (ref 12.0–15.0)
Hemoglobin: 9.7 g/dL — ABNORMAL LOW (ref 12.0–15.0)
MCH: 29.2 pg (ref 26.0–34.0)
MCH: 29.1 pg (ref 26.0–34.0)
MCHC: 32 g/dL (ref 30.0–36.0)
MCHC: 32.3 g/dL (ref 30.0–36.0)
MCV: 91.2 fL (ref 80.0–100.0)
MCV: 90.1 fL (ref 80.0–100.0)
Platelets: 205 10*3/uL (ref 150–400)
Platelets: 226 10*3/uL (ref 150–400)
RBC: 3.19 MIL/uL — ABNORMAL LOW (ref 3.87–5.11)
RBC: 3.33 MIL/uL — ABNORMAL LOW (ref 3.87–5.11)
RDW: 12.4 % (ref 11.5–15.5)
RDW: 12.4 % (ref 11.5–15.5)
WBC: 6 10*3/uL (ref 4.0–10.5)
WBC: 6.6 10*3/uL (ref 4.0–10.5)
nRBC: 0 % (ref 0.0–0.2)
nRBC: 0 % (ref 0.0–0.2)

## 2022-02-26 LAB — RESPIRATORY PANEL BY PCR

## 2022-02-26 LAB — GLUCOSE, CAPILLARY
Glucose-Capillary: 267 mg/dL — ABNORMAL HIGH (ref 70–99)
Glucose-Capillary: 246 mg/dL — ABNORMAL HIGH (ref 70–99)
Glucose-Capillary: 368 mg/dL — ABNORMAL HIGH (ref 70–99)
Glucose-Capillary: 294 mg/dL — ABNORMAL HIGH (ref 70–99)

## 2022-02-26 LAB — MAGNESIUM: Magnesium: 1.5 mg/dL — ABNORMAL LOW (ref 1.7–2.4)

## 2022-02-26 LAB — RETICULOCYTES
Immature Retic Fract: 27.4 % — ABNORMAL HIGH (ref 2.3–15.9)
RBC.: 3.35 MIL/uL — ABNORMAL LOW (ref 3.87–5.11)
Retic Count, Absolute: 63.7 10*3/uL (ref 19.0–186.0)
Retic Ct Pct: 1.9 % (ref 0.4–3.1)

## 2022-02-26 LAB — HIV ANTIBODY (ROUTINE TESTING W REFLEX): HIV Screen 4th Generation wRfx: NONREACTIVE

## 2022-02-26 LAB — IRON AND TIBC
Iron: 20 ug/dL — ABNORMAL LOW (ref 28–170)
Saturation Ratios: 9 % — ABNORMAL LOW (ref 10.4–31.8)
TIBC: 226 ug/dL — ABNORMAL LOW (ref 250–450)
UIBC: 206 ug/dL

## 2022-02-26 LAB — FERRITIN: Ferritin: 130 ng/mL (ref 11–307)

## 2022-02-26 LAB — FOLATE: Folate: 12.2 ng/mL (ref >5.9)

## 2022-02-26 LAB — VITAMIN B12: Vitamin B-12: 487 pg/mL (ref 180–914)

## 2022-02-26 LAB — STREP PNEUMONIAE URINARY ANTIGEN: Strep Pneumo Urinary Antigen: NEGATIVE

## 2022-02-26 LAB — LACTIC ACID, PLASMA: Lactic Acid, Venous: 0.8 mmol/L (ref 0.5–1.9)

## 2022-02-26 LAB — TSH: TSH: 2.597 u[IU]/mL (ref 0.350–4.500)

## 2022-02-26 MED ORDER — PREDNISONE 20 MG PO TABS: 20.0000 mg | ORAL_TABLET | Freq: Every day | ORAL | Status: DC

## 2022-02-26 MED ORDER — INSULIN GLARGINE-YFGN 100 UNIT/ML ~~LOC~~ SOLN
30.0000 [IU] | Freq: Every day | SUBCUTANEOUS | Status: DC
  Administered 2022-02-26: 30 [IU] via SUBCUTANEOUS
  Filled 2022-02-26: qty 0.3

## 2022-02-26 MED ORDER — SODIUM CHLORIDE 0.9% FLUSH
10.0000 mL | INTRAVENOUS | Status: DC | PRN
  Administered 2022-02-27 – 2022-03-08 (×2): 10 mL

## 2022-02-26 MED ORDER — SODIUM CHLORIDE 0.9 % IV SOLN
500.0000 mg | INTRAVENOUS | Status: DC
  Administered 2022-02-26 – 2022-02-27 (×2): 500 mg via INTRAVENOUS
  Filled 2022-02-26 (×2): qty 5

## 2022-02-26 MED ORDER — MAGNESIUM SULFATE 4 GM/100ML IV SOLN
4.0000 g | Freq: Once | INTRAVENOUS | Status: AC
  Administered 2022-02-26: 4 g via INTRAVENOUS
  Filled 2022-02-26: qty 100

## 2022-02-26 MED ORDER — LORATADINE 10 MG PO TABS
10.0000 mg | ORAL_TABLET | Freq: Every day | ORAL | Status: DC
  Administered 2022-02-27 – 2022-03-12 (×13): 10 mg via ORAL
  Filled 2022-02-26 (×14): qty 1

## 2022-02-26 MED ORDER — INSULIN ASPART 100 UNIT/ML IJ SOLN
0.0000 [IU] | Freq: Every day | INTRAMUSCULAR | Status: DC
  Administered 2022-02-26: 3 [IU] via SUBCUTANEOUS
  Administered 2022-02-27 – 2022-02-28 (×2): 2 [IU] via SUBCUTANEOUS
  Administered 2022-03-02 – 2022-03-03 (×2): 4 [IU] via SUBCUTANEOUS
  Administered 2022-03-04: 3 [IU] via SUBCUTANEOUS

## 2022-02-26 MED ORDER — PREDNISONE 20 MG PO TABS: 30.0000 mg | ORAL_TABLET | Freq: Every day | ORAL | Status: DC

## 2022-02-26 MED ORDER — POLYETHYLENE GLYCOL 3350 17 G PO PACK
17.0000 g | PACK | Freq: Every day | ORAL | Status: DC
  Administered 2022-02-26 – 2022-02-27 (×2): 17 g via ORAL
  Filled 2022-02-26 (×2): qty 1

## 2022-02-26 MED ORDER — ALPRAZOLAM 0.5 MG PO TABS
0.5000 mg | ORAL_TABLET | Freq: Three times a day (TID) | ORAL | Status: DC | PRN
  Administered 2022-02-26 – 2022-03-11 (×9): 0.5 mg via ORAL
  Filled 2022-02-26 (×9): qty 1

## 2022-02-26 MED ORDER — SENNOSIDES-DOCUSATE SODIUM 8.6-50 MG PO TABS
2.0000 | ORAL_TABLET | Freq: Two times a day (BID) | ORAL | Status: DC
  Administered 2022-02-26 – 2022-03-12 (×17): 2 via ORAL
  Filled 2022-02-26 (×21): qty 2

## 2022-02-26 MED ORDER — MOMETASONE FURO-FORMOTEROL FUM 200-5 MCG/ACT IN AERO
2.0000 | INHALATION_SPRAY | Freq: Two times a day (BID) | RESPIRATORY_TRACT | Status: DC
  Administered 2022-02-26 – 2022-03-01 (×6): 2 via RESPIRATORY_TRACT
  Filled 2022-02-26: qty 8.8

## 2022-02-26 MED ORDER — POTASSIUM PHOSPHATES 15 MMOLE/5ML IV SOLN
30.0000 mmol | Freq: Once | INTRAVENOUS | Status: DC
  Filled 2022-02-26: qty 10

## 2022-02-26 MED ORDER — OXYCODONE HCL ER 10 MG PO T12A
10.0000 mg | EXTENDED_RELEASE_TABLET | Freq: Two times a day (BID) | ORAL | Status: DC
  Administered 2022-02-26 – 2022-03-12 (×27): 10 mg via ORAL
  Filled 2022-02-26 (×27): qty 1

## 2022-02-26 MED ORDER — INSULIN ASPART 100 UNIT/ML IJ SOLN
0.0000 [IU] | Freq: Three times a day (TID) | INTRAMUSCULAR | Status: DC
  Administered 2022-02-26: 8 [IU] via SUBCUTANEOUS
  Administered 2022-02-26: 15 [IU] via SUBCUTANEOUS
  Administered 2022-02-26: 5 [IU] via SUBCUTANEOUS
  Administered 2022-02-27: 8 [IU] via SUBCUTANEOUS
  Administered 2022-02-27: 15 [IU] via SUBCUTANEOUS
  Administered 2022-02-27: 5 [IU] via SUBCUTANEOUS
  Administered 2022-02-28: 8 [IU] via SUBCUTANEOUS
  Administered 2022-02-28: 2 [IU] via SUBCUTANEOUS
  Administered 2022-02-28: 11 [IU] via SUBCUTANEOUS
  Administered 2022-03-01: 8 [IU] via SUBCUTANEOUS
  Administered 2022-03-01: 3 [IU] via SUBCUTANEOUS
  Administered 2022-03-01 – 2022-03-02 (×2): 2 [IU] via SUBCUTANEOUS
  Administered 2022-03-02: 15 [IU] via SUBCUTANEOUS
  Administered 2022-03-03: 5 [IU] via SUBCUTANEOUS
  Administered 2022-03-03: 2 [IU] via SUBCUTANEOUS
  Administered 2022-03-03: 3 [IU] via SUBCUTANEOUS
  Administered 2022-03-04: 15 [IU] via SUBCUTANEOUS
  Administered 2022-03-04: 5 [IU] via SUBCUTANEOUS
  Administered 2022-03-05: 15 [IU] via SUBCUTANEOUS

## 2022-02-26 MED ORDER — FLUOXETINE HCL 20 MG PO CAPS
20.0000 mg | ORAL_CAPSULE | Freq: Every day | ORAL | Status: DC
  Administered 2022-02-26 – 2022-03-11 (×13): 20 mg via ORAL
  Filled 2022-02-26 (×13): qty 1

## 2022-02-26 MED ORDER — APIXABAN 5 MG PO TABS
10.0000 mg | ORAL_TABLET | Freq: Two times a day (BID) | ORAL | Status: DC
  Administered 2022-02-26 – 2022-03-01 (×7): 10 mg via ORAL
  Filled 2022-02-26 (×7): qty 2

## 2022-02-26 MED ORDER — ACETAMINOPHEN 650 MG RE SUPP: 650.0000 mg | Freq: Four times a day (QID) | RECTAL | Status: DC | PRN

## 2022-02-26 MED ORDER — PREDNISONE 20 MG PO TABS
40.0000 mg | ORAL_TABLET | Freq: Every day | ORAL | Status: DC
  Administered 2022-02-27 – 2022-02-28 (×2): 40 mg via ORAL
  Filled 2022-02-26 (×2): qty 2

## 2022-02-26 MED ORDER — SODIUM CHLORIDE 0.9 % IV SOLN
2.0000 g | INTRAVENOUS | Status: DC
  Administered 2022-02-26: 2 g via INTRAVENOUS
  Filled 2022-02-26: qty 20

## 2022-02-26 MED ORDER — ONDANSETRON HCL 4 MG PO TABS: 4.0000 mg | ORAL_TABLET | Freq: Four times a day (QID) | ORAL | Status: DC | PRN

## 2022-02-26 MED ORDER — ALBUTEROL SULFATE (2.5 MG/3ML) 0.083% IN NEBU: 2.5000 mg | INHALATION_SOLUTION | RESPIRATORY_TRACT | Status: DC | PRN

## 2022-02-26 MED ORDER — ONDANSETRON HCL 4 MG/2ML IJ SOLN: 4.0000 mg | Freq: Four times a day (QID) | INTRAMUSCULAR | Status: DC | PRN

## 2022-02-26 MED ORDER — POTASSIUM CHLORIDE CRYS ER 10 MEQ PO TBCR
40.0000 meq | EXTENDED_RELEASE_TABLET | Freq: Once | ORAL | Status: AC
  Administered 2022-02-26: 40 meq via ORAL
  Filled 2022-02-26: qty 4

## 2022-02-26 MED ORDER — ACETAMINOPHEN 325 MG PO TABS
650.0000 mg | ORAL_TABLET | Freq: Four times a day (QID) | ORAL | Status: DC | PRN
  Administered 2022-02-26 – 2022-02-27 (×2): 650 mg via ORAL
  Filled 2022-02-26 (×2): qty 2

## 2022-02-26 MED ORDER — CHLORHEXIDINE GLUCONATE CLOTH 2 % EX PADS
6.0000 | MEDICATED_PAD | Freq: Every day | CUTANEOUS | Status: DC
  Administered 2022-02-26 – 2022-03-12 (×14): 6 via TOPICAL

## 2022-02-26 MED ORDER — PREDNISONE 10 MG PO TABS: 10.0000 mg | ORAL_TABLET | Freq: Every day | ORAL | Status: DC

## 2022-02-26 MED ORDER — FLUTICASONE PROPIONATE 50 MCG/ACT NA SUSP
2.0000 | Freq: Every day | NASAL | Status: DC
  Administered 2022-02-27 – 2022-03-12 (×8): 2 via NASAL
  Filled 2022-02-26: qty 16

## 2022-02-26 MED ORDER — APIXABAN 5 MG PO TABS: 5.0000 mg | ORAL_TABLET | Freq: Two times a day (BID) | ORAL | Status: DC

## 2022-02-26 MED ORDER — OXYCODONE-ACETAMINOPHEN 7.5-325 MG PO TABS
1.0000 | ORAL_TABLET | Freq: Four times a day (QID) | ORAL | Status: DC | PRN
  Administered 2022-02-26 – 2022-03-05 (×6): 1 via ORAL
  Filled 2022-02-26 (×6): qty 1

## 2022-02-26 MED ORDER — IPRATROPIUM-ALBUTEROL 0.5-2.5 (3) MG/3ML IN SOLN
3.0000 mL | Freq: Three times a day (TID) | RESPIRATORY_TRACT | Status: DC
  Administered 2022-02-26 – 2022-03-12 (×42): 3 mL via RESPIRATORY_TRACT
  Filled 2022-02-26 (×43): qty 3

## 2022-02-26 MED ORDER — PANTOPRAZOLE SODIUM 40 MG PO TBEC
40.0000 mg | DELAYED_RELEASE_TABLET | Freq: Every day | ORAL | Status: DC
  Administered 2022-02-26 – 2022-03-12 (×14): 40 mg via ORAL
  Filled 2022-02-26 (×14): qty 1

## 2022-02-26 MED ORDER — BISACODYL 10 MG RE SUPP
10.0000 mg | Freq: Every day | RECTAL | Status: DC
  Administered 2022-02-26 – 2022-03-02 (×3): 10 mg via RECTAL
  Filled 2022-02-26 (×7): qty 1

## 2022-02-26 MED ORDER — POTASSIUM PHOSPHATES 15 MMOLE/5ML IV SOLN
30.0000 mmol | Freq: Once | INTRAVENOUS | Status: AC
  Administered 2022-02-26: 30 mmol via INTRAVENOUS
  Filled 2022-02-26: qty 10

## 2022-02-26 MED ORDER — LEVOTHYROXINE SODIUM 25 MCG PO TABS
125.0000 ug | ORAL_TABLET | Freq: Every day | ORAL | Status: DC
  Administered 2022-02-26 – 2022-03-12 (×15): 125 ug via ORAL
  Filled 2022-02-26 (×15): qty 1

## 2022-02-26 NOTE — Assessment & Plan Note (Addendum)
Repleted. °

## 2022-02-26 NOTE — Subjective & Objective (Signed)
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  Patient states that last week she had upper respiratory/flulike symptoms.  She had bodyaches and a fever of 101.  She went on for about 2 to 3 days.  She had telehealth visit on 17 November.  She had some diarrhea.  Her COVID test was negative at home.  Based recommended supportive care.  She continued to feel bad and went to another urgent care yesterday.  She is noted to have room air saturation of 89%.  She was recommended to go to the ER.  Repeat vital signs in the ER did demonstrate a room air saturation of 89%.  She was placed on supplemental oxygen.  CTPA showed a small segmental and subsegmental PE in the right upper distal long-segment.  There is patchy airspace infiltrates suggesting multifocal pneumonia.  However her CBC was normal with a normal white count of 6.2.  COVID test was negative.  Influenza AMB were negative.  Respiratory viral panel is also negative.  She was given a dose of Rocephin and Zithromax.   EDP is consulted pulmonology to evaluate the patient.  She was started on IV heparin due to her PE.

## 2022-02-26 NOTE — Assessment & Plan Note (Addendum)
-   TSH 2.597.  -Free T4  1.28.   -Urine sodium 142, urine potassium 27,  -Concern hyponatremia may be secondary to small cell lung cancer.  - Na now stable

## 2022-02-26 NOTE — Telephone Encounter (Signed)
Patient currently hospitalized on 02/26/22 for pneumonia +/- subacute radiation pneumonitis. Started on prednisone taper. Likely discharge this weekend  When able, please schedule patient for follow-up with Dr. Lamonte Sakai or NP for hospital follow-up.

## 2022-02-26 NOTE — Progress Notes (Signed)
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  Patient noted prior to admission to have some chills, body aches, fever as high as 101.  Patient noted to have sats of 89% on room air and sent to the ED from urgent care center.  COVID-19 PCR negative, influenza a and B PCR negative, respiratory viral panel negative.  CT chest done with segmental PE, concern for pneumonia.  Patient placed empirically on IV antibiotics, anticoagulation.  PCCM consulted due to concerns for radiation pneumonitis.    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  -Likely multifactorial secondary to pneumonia in a patient with immunosuppression, concern for radiation pneumonitis and also noted to have segmental PE which is unlikely source of hypoxia.   -COVID-19 PCR negative, influenza AMB negative, respiratory viral panel negative.   -CT angiogram chest with pulmonary emboli focal in the right upper lung distal segmental and subsegmental vessels, developing patchy airspace interstitial infiltrates  most likely representing multifocal pneumonia, prominent mediastinal and abdominal lymph nodes likely metastatic, multiple bone mets.   -Patient with clinical improvement.   -Urine strep pneumococcus antigen negative.  Urine Legionella antigen pending. -Place back empirically on IV Rocephin, IV azithromycin. -Placed on scheduled DuoNebs, Claritin, Dulera, Incruse, PPI -Continue anticoagulation with Eliquis. -PCCM consulted due to concerns for radiation pneumonitis and patient to be started on a prednisone taper. -Supportive care. -Check ambulatory sats in the AM.  Subsegmental pulmonary embolism without acute cor pulmonale (HCC) Small PE. Doubt this would cause cardiac issues.  -Patient initially placed on IV heparin in the ED and has been transitioned to Eliquis.   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) Stable. Followed by oncology. Had 4 sessions of XRT in October 2023. Last session on 01-08-2022. Last received IV chemo July 2023. -Oncology following.  Type 2 diabetes mellitus without complication, with long-term current use of insulin (HCC) Stable.  -Hemoglobin A1c 11.3 (02/21/2021 ) -Repeat hemoglobin A1c.  -CBG 132 this morning.  -Continue Semglee 30 units daily and uptitrate as needed for better blood glucose control, continue SSI.   Hypothyroidism - Continue home regimen Synthroid 125 mcg daily.   Anemia -Patient with no overt bleeding. -Anemia likely secondary to acute illness and radiation treatment. -Check an anemia panel. -Low H&H. -Transfusion threshold hemoglobin < 7  Hypophosphatemia -Phosphorus at 1.7. -K-Phos 30 mmol IV x 1. -Repeat labs in the AM.  Hypomagnesemia -Mg 1.5. -Magnesium sulfate 4 g IV x 1. -Repeat labs in AM.  Hypokalemia -Magnesium noted at 1.5. -Magnesium sulfate 4 g IV x 1. -  KDur 40 mEq p.o. x 1.  Other constipation -Patient started on MiraLAX twice daily, Senokot-S twice daily. -Add Dulcolax  suppository daily.  Hyponatremia - TSH 2.597.  -Free T4  1.28.   -Urine sodium 142, urine potassium 27,  -Concern hyponatremia may be secondary to small cell lung cancer.  -Sodium levels improved currently at 134 this morning.  -Repeat labs in the AM.          DVT prophylaxis: Eliquis Code Status: Full Family Communication: Updated patient.  No family at bedside. Disposition: Likely home when clinically improved, cleared by pulmonary.  Status is: Inpatient Remains inpatient appropriate because: Severity of illness   Consultants:  PCCM: Dr. Loanne Drilling 02/26/2022 Oncology: Dr. Alvy Bimler 02/26/2022  Procedures:  Chest x-ray 02/25/2022 CT angiogram chest 02/25/2022   Antimicrobials:  IV azithromycin 02/25/2022>>>> IV Rocephin 02/25/2022>>>>   Subjective: Laying in bed.  Denies any chest pain.  No shortness of breath.  Denies any productive cough.  Stated at home only had fever and chills.  States that she has been seen by pulmonologist.  Objective: Vitals:   02/26/22 0645 02/26/22 0805 02/26/22 1144 02/26/22 1250  BP: 129/78 139/81  121/77  Pulse: (!) 103 (!) 103  92  Resp: 20 19  18   Temp: 98.9 F (37.2 C) 98.3 F (36.8 C)  98.4 F (36.9 C)  TempSrc: Oral Oral  Oral  SpO2: 95% 93% 93% 97%  Weight:      Height:        Intake/Output Summary (Last 24 hours) at 02/26/2022 1658 Last data filed at 02/26/2022 1000 Gross per 24 hour  Intake 2000.72 ml  Output 500 ml  Net 1500.72 ml   Filed Weights   02/25/22 1557 02/26/22 0500  Weight: 53.1 kg 50.8 kg    Examination:  General exam: Appears calm and comfortable  Respiratory system: Some decreased breath sounds in the bases otherwise clear.  No rhonchi, no crackles, no wheezing.  Fair air movement.  Speaking in full sentences.  No use of accessory muscles of respiration.   Cardiovascular system: Regular rate rhythm no murmurs rubs or gallops.  No JVD.  No lower extremity edema.  Gastrointestinal system: Abdomen  is soft, nontender, nondistended, positive bowel sounds.  No rebound.  No guarding.  Central nervous system: Alert and oriented. No focal neurological deficits. Extremities: Symmetric 5 x 5 power. Skin: No rashes, lesions or ulcers Psychiatry: Judgement and insight appear normal. Mood & affect appropriate.     Data Reviewed:   CBC: Recent Labs  Lab 02/25/22 1656 02/26/22 0333 02/26/22 1155  WBC 6.2 6.0 6.6  NEUTROABS 4.8  --   --   HGB 11.1* 9.3* 9.7*  HCT 34.4* 29.1* 30.0*  MCV 89.6 91.2 90.1  PLT 241 205 518    Basic Metabolic Panel: Recent Labs  Lab 02/25/22 1656 02/26/22 1155  NA 130* 134*  K 4.1 3.4*  CL 93* 102  CO2 27 26  GLUCOSE 462* 236*  BUN 16 11  CREATININE 0.75 0.54  CALCIUM 8.5* 8.3*  MG  --  1.5*  PHOS  --  1.7*    GFR: Estimated Creatinine Clearance: 45.2 mL/min (by C-G formula based on SCr of 0.54 mg/dL).  Liver Function Tests: Recent Labs  Lab 02/25/22 1656 02/26/22 1155  AST 21  --   ALT 20  --   ALKPHOS 64  --   BILITOT 0.7  --   PROT 8.0  --   ALBUMIN 3.2* 2.9*    CBG:  Recent Labs  Lab 02/26/22 0801 02/26/22 1125  GLUCAP 267* 246*     Recent Results (from the past 240 hour(s))  Resp Panel by RT-PCR (Flu A&B, Covid) Anterior Nasal Swab     Status: None   Collection Time: 02/25/22  4:33 PM   Specimen: Anterior Nasal Swab  Result Value Ref Range Status   SARS Coronavirus 2 by RT PCR NEGATIVE NEGATIVE Final    Comment: (NOTE) SARS-CoV-2 target nucleic acids are NOT DETECTED.  The SARS-CoV-2 RNA is generally detectable in upper respiratory specimens during the acute phase of infection. The lowest concentration of SARS-CoV-2 viral copies this assay can detect is 138 copies/mL. A negative result does not preclude SARS-Cov-2 infection and should not be used as the sole basis for treatment or other patient management decisions. A negative result may occur with  improper specimen collection/handling, submission of specimen  other than nasopharyngeal swab, presence of viral mutation(s) within the areas targeted by this assay, and inadequate number of viral copies(<138 copies/mL). A negative result must be combined with clinical observations, patient history, and epidemiological information. The expected result is Negative.  Fact Sheet for Patients:  EntrepreneurPulse.com.au  Fact Sheet for Healthcare Providers:  IncredibleEmployment.be  This test is no t yet approved or cleared by the [place] FDA and  has been authorized for detection and/or diagnosis of SARS-CoV-2 by FDA under an Emergency Use Authorization (EUA). This EUA will remain  in effect (meaning this test can be used) for the duration of the COVID-19 declaration under Section 564(b)(1) of the Act, 21 U.S.C.section 360bbb-3(b)(1), unless the authorization is terminated  or revoked sooner.       Influenza A by PCR NEGATIVE NEGATIVE Final   Influenza B by PCR NEGATIVE NEGATIVE Final    Comment: (NOTE) The Xpert Xpress SARS-CoV-2/FLU/RSV plus assay is intended as an aid in the diagnosis of influenza from Nasopharyngeal swab specimens and should not be used as a sole basis for treatment. Nasal washings and aspirates are unacceptable for Xpert Xpress SARS-CoV-2/FLU/RSV testing.  Fact Sheet for Patients: EntrepreneurPulse.com.au  Fact Sheet for Healthcare Providers: IncredibleEmployment.be  This test is not yet approved or cleared by the [place] FDA and has been authorized for detection and/or diagnosis of SARS-CoV-2 by FDA under an Emergency Use Authorization (EUA). This EUA will remain in effect (meaning this test can be used) for the duration of the COVID-19 declaration under Section 564(b)(1) of the Act, 21 U.S.C. section 360bbb-3(b)(1), unless the authorization is terminated or revoked.  Performed at [facility], [address]  [place], [place] [address]   Culture, blood (Routine X 2) w Reflex to ID Panel     Status: None (Preliminary result)   Collection Time: 02/25/22  4:57 PM   Specimen: BLOOD  Result Value Ref Range Status   Specimen Description   Final    BLOOD BOTTLES DRAWN AEROBIC AND ANAEROBIC LEFT ANTECUBITAL Performed at [facility], [address] [place], [place] [address]    Special Requests   Final    Blood Culture adequate volume Performed at [facility], [address] [place], [place] [address]    Culture   Final    NO GROWTH < 24 HOURS Performed at [facility], [place] [address], [place], [place] [address]    Report Status PENDING  Incomplete  Respiratory (~20 pathogens) panel by PCR     Status: None   Collection Time: 02/25/22  9:09 PM   Specimen: Nasopharyngeal Swab; Respiratory  Result Value Ref Range Status   Adenovirus NOT DETECTED NOT DETECTED Final   Coronavirus 229E NOT DETECTED NOT DETECTED Final    Comment: (NOTE) The Coronavirus on the Respiratory Panel, DOES NOT test for the novel  Coronavirus (2019 nCoV)    Coronavirus HKU1 NOT DETECTED NOT DETECTED Final   Coronavirus NL63 NOT DETECTED NOT DETECTED Final   Coronavirus OC43 NOT DETECTED NOT DETECTED Final   Metapneumovirus NOT DETECTED NOT DETECTED Final   Rhinovirus / Enterovirus NOT DETECTED NOT DETECTED Final   Influenza A NOT DETECTED NOT DETECTED Final   Influenza B NOT DETECTED NOT DETECTED Final   Parainfluenza Virus 1 NOT DETECTED NOT DETECTED Final   Parainfluenza Virus 2 NOT DETECTED NOT DETECTED Final   Parainfluenza Virus 3 NOT DETECTED NOT DETECTED Final   Parainfluenza Virus 4 NOT DETECTED NOT DETECTED Final   Respiratory Syncytial Virus NOT DETECTED NOT DETECTED Final   Bordetella pertussis NOT DETECTED NOT DETECTED Final   Bordetella Parapertussis NOT DETECTED NOT DETECTED Final   Chlamydophila pneumoniae NOT DETECTED NOT DETECTED Final   Mycoplasma pneumoniae NOT DETECTED  NOT DETECTED Final    Comment: Performed at [facility], [place]. [address], [place], [place] [address]         Radiology Studies: VAS [place] LOWER EXTREMITY VENOUS (DVT)  Result Date: 02/26/2022  Lower Venous DVT Study Patient Name:  [name]  Date of Exam:   02/26/2022 Medical Rec #: [id number]      Accession #:    [id number] Date of Birth: [birth year]      Patient Gender: F Patient Age:   22 years Exam Location:  [facility] Procedure:      VAS [place] LOWER EXTREMITY VENOUS (DVT) Referring Phys: Irine Seal --------------------------------------------------------------------------------  Indications: Pulmonary embolism.  Limitations: Poor ultrasound/tissue interface. Comparison Study: No prior studies. Performing Technologist: Oliver Hum RVT  Examination Guidelines: A complete evaluation includes B-mode imaging, spectral Doppler, color Doppler, and power Doppler as needed of all accessible portions of each vessel. Bilateral testing is considered an integral part of a complete examination. Limited examinations for reoccurring indications may be performed as noted. The reflux portion of the exam is performed with the patient in reverse Trendelenburg.  +---------+---------------+---------+-----------+----------+--------------+ RIGHT    CompressibilityPhasicitySpontaneityPropertiesThrombus Aging +---------+---------------+---------+-----------+----------+--------------+ CFV      Full           Yes      Yes                                 +---------+---------------+---------+-----------+----------+--------------+ SFJ      Full                                                        +---------+---------------+---------+-----------+----------+--------------+ FV Prox  Full                                                        +---------+---------------+---------+-----------+----------+--------------+ FV Mid   Full                                                         +---------+---------------+---------+-----------+----------+--------------+  FV DistalFull                                                        +---------+---------------+---------+-----------+----------+--------------+ PFV      Full                                                        +---------+---------------+---------+-----------+----------+--------------+ POP      Full           Yes      Yes                                 +---------+---------------+---------+-----------+----------+--------------+ PTV      Full                                                        +---------+---------------+---------+-----------+----------+--------------+ PERO     Full                                                        +---------+---------------+---------+-----------+----------+--------------+   +---------+---------------+---------+-----------+----------+--------------+ LEFT     CompressibilityPhasicitySpontaneityPropertiesThrombus Aging +---------+---------------+---------+-----------+----------+--------------+ CFV      Full           Yes      Yes                                 +---------+---------------+---------+-----------+----------+--------------+ SFJ      Full                                                        +---------+---------------+---------+-----------+----------+--------------+ FV Prox  Full                                                        +---------+---------------+---------+-----------+----------+--------------+ FV Mid   Full                                                        +---------+---------------+---------+-----------+----------+--------------+ FV DistalFull                                                        +---------+---------------+---------+-----------+----------+--------------+  PFV      Full                                                         +---------+---------------+---------+-----------+----------+--------------+ POP      Full           Yes      Yes                                 +---------+---------------+---------+-----------+----------+--------------+ PTV      Full                                                        +---------+---------------+---------+-----------+----------+--------------+ PERO     Full                                                        +---------+---------------+---------+-----------+----------+--------------+     Summary: RIGHT: - There is no evidence of deep vein thrombosis in the lower extremity.  - No cystic structure found in the popliteal fossa.  LEFT: - There is no evidence of deep vein thrombosis in the lower extremity.  - No cystic structure found in the popliteal fossa.  *See table(s) above for measurements and observations.    Preliminary    CT Angio Chest PE W/Cm &/Or Wo Cm  Result Date: 02/25/2022 CLINICAL DATA:  Pulmonary embolus suspected with high probability. EXAM: CT ANGIOGRAPHY CHEST WITH CONTRAST TECHNIQUE: Multidetector CT imaging of the chest was performed using the standard protocol during bolus administration of intravenous contrast. Multiplanar CT image reconstructions and MIPs were obtained to evaluate the vascular anatomy. RADIATION DOSE REDUCTION: This exam was performed according to the departmental dose-optimization program which includes automated exposure control, adjustment of the mA and/or kV according to patient size and/or use of iterative reconstruction technique. CONTRAST:  26mL OMNIPAQUE IOHEXOL 350 MG/ML SOLN COMPARISON:  PET-CT 11/03/2021.  CT chest 05/14/2021 FINDINGS: Cardiovascular: Good opacification of the central and segmental pulmonary arteries with technically adequate study. Intraluminal filling defects are demonstrated in segmental and subsegmental right upper lobe pulmonary arteries. This is consistent with acute nonocclusive pulmonary embolus.  No additional emboli are demonstrated. RA to RV ratio is normal suggesting no evidence of heart strain. Normal heart size. No pericardial effusions. Normal caliber thoracic aorta. No aortic dissection. Great vessel origins are patent. Mediastinum/Nodes: Thyroid gland is unremarkable. Central venous catheter with tip in the cavoatrial junction. Esophagus is decompressed. Small esophageal hiatal hernia. Lymphadenopathy in the mediastinum with pretracheal lymph nodes measuring up to 1.3 cm in diameter. Lymphadenopathy is similar to previous PET-CT and decreased since prior CT. Lungs/Pleura: Emphysematous changes in the lungs. Patchy interstitial and ground-glass infiltrates demonstrated in both lungs. Changes are new since previous studies, likely indicating multifocal pneumonia. Edema would be less likely to have this distribution. There is peribronchial thickening with mild bronchiectasis. No pleural effusions. No pneumothorax. Upper Abdomen: Surgical absence of the gallbladder. Suggestion of prominent  periaortic lymph nodes but incompletely included within the field of view. Nodes measure up to 1.1 cm diameter. Abdominal lymphadenopathy may be progressing since previous study. Musculoskeletal: Degenerative changes in the spine. Postoperative changes in the cervical spine. Multiple mixed sclerotic and lucent lesions throughout the thoracic vertebrae consistent with metastatic disease. Similar appearance to previous studies. Review of the MIP images confirms the above findings. IMPRESSION: 1. Positive examination for pulmonary emboli demonstrated focally in right upper lung distal segmental and subsegmental vessels. 2. Developing patchy airspace and interstitial infiltrates in the lungs most likely representing multifocal pneumonia. 3. Prominent mediastinal and abdominal lymph nodes, likely metastatic. 4. Multiple bone metastases again demonstrated. Critical Value/emergent results were called by telephone at the time  of interpretation on 02/25/2022 at 7:45 pm to provider Fredia Sorrow , who verbally acknowledged these results. Electronically Signed   By: Lucienne Capers M.D.   On: 02/25/2022 19:53   DG Chest Port 1 View  Result Date: 02/25/2022 CLINICAL DATA:  Shortness of breath EXAM: PORTABLE CHEST 1 VIEW COMPARISON:  Chest x-ray July 25, 2020. FINDINGS: Chronic streaky bibasilar opacities. No confluent consolidation. No visible pleural effusions or pneumothorax. Right IJ approach Port-A-Cath with the tip projecting at the right atrium. Cardiomediastinal silhouette is unchanged. Partially imaged cervical fusion hardware. IMPRESSION: Chronic streaky bibasilar opacities, which may represent atelectasis/scar given unchanged appearance. Superimposed atypical infection is difficult to exclude. No confluent consolidation. Electronically Signed   By: Margaretha Sheffield M.D.   On: 02/25/2022 16:17        Scheduled Meds:  apixaban  10 mg Oral BID   Followed by   Derrill Memo ON 03/05/2022] apixaban  5 mg Oral BID   Chlorhexidine Gluconate Cloth  6 each Topical Daily   fluticasone  2 spray Each Nare Daily   insulin aspart  0-15 Units Subcutaneous TID WC   insulin aspart  0-5 Units Subcutaneous QHS   insulin glargine-yfgn  30 Units Subcutaneous QHS   ipratropium-albuterol  3 mL Nebulization TID   levothyroxine  125 mcg Oral Q0600   loratadine  10 mg Oral Daily   mometasone-formoterol  2 puff Inhalation BID   pantoprazole  40 mg Oral Daily   polyethylene glycol  17 g Oral Daily   [START ON 02/27/2022] predniSONE  40 mg Oral Q breakfast   Followed by   Derrill Memo ON 03/02/2022] predniSONE  30 mg Oral Q breakfast   Followed by   Derrill Memo ON 03/05/2022] predniSONE  20 mg Oral Q breakfast   Followed by   Derrill Memo ON 03/08/2022] predniSONE  10 mg Oral Q breakfast   senna-docusate  2 tablet Oral BID   Continuous Infusions:  azithromycin     cefTRIAXone (ROCEPHIN)  IV       LOS: 0 days    Time spent: 35  minutes    Irine Seal, MD [facility]   To contact the attending provider between 7A-7P or the covering provider during after hours 7P-7A, please log into the web site [email or web address] and access using universal [place] password for that web site. If you do not have the password, please call the hospital operator.  02/26/2022, 4:58 PM

## 2022-02-26 NOTE — Assessment & Plan Note (Addendum)
-   CTA chest on admission: Positive examination for pulmonary emboli demonstrated focally in right upper lung distal segmental and subsegmental vessels. Developing patchy airspace and interstitial infiltrates in the lungs most likely representing multifocal pneumonia. Prominent mediastinal and abdominal lymph nodes, likely metastatic. Multiple bone metastases again demonstrated. - echo obtained 11/25: EF 60-65%, Gr 1 DD. No RV failure or strain - continue Lovenox at discharge; likely can convert to 1.5 mg/kg at discharge for daily dosing

## 2022-02-26 NOTE — Progress Notes (Signed)
Bilateral lower extremity venous duplex has been completed. Preliminary results can be found in CV Proc through chart review.   02/26/22 1:03 PM [name] RVT

## 2022-02-26 NOTE — Assessment & Plan Note (Addendum)
Stable. Followed by oncology. Had 4 sessions of XRT in October 2023. Last session on 01-08-2022. Last received IV chemo July 2023. -Oncology following.

## 2022-02-26 NOTE — Assessment & Plan Note (Addendum)
-  Continue current bowel regimen

## 2022-02-26 NOTE — H&P (Addendum)
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  Patient states that last week she had upper respiratory/flulike symptoms.  She had bodyaches and a fever of 101.  She went on for about 2 to 3 days.  She had telehealth visit on 17 November.  She had some diarrhea.  Her COVID test was negative at home.  Based recommended supportive care.  She continued to feel bad and went to another urgent care yesterday.  She is noted to have room air saturation of 89%.  She was recommended to go to the ER.  Repeat vital signs in the ER did demonstrate a room air saturation of 89%.  She was placed on supplemental oxygen.  CTPA showed a small segmental and subsegmental PE in the right upper distal long-segment.  There is patchy airspace infiltrates suggesting multifocal pneumonia.  However her CBC was normal with a normal white count of 6.2.  COVID test was negative.  Influenza AMB were negative.  Respiratory viral panel is also negative.  She was given a dose of Rocephin and Zithromax.   EDP is consulted pulmonology to evaluate the patient.  She was started on IV heparin due to her PE.   ED Course: CTPA shows small subsegmental PE  Review of Systems:  Review of Systems  Constitutional:  Positive for chills and fever.       Fever and chills last week. No fever in 3 days  HENT:  Positive for congestion and sore throat.   Eyes: Negative.   Respiratory: Negative.  Negative for cough, sputum production and shortness of breath.    Cardiovascular: Negative.   Gastrointestinal: Negative.   Genitourinary: Negative.   Musculoskeletal: Negative.   Skin: Negative.   Neurological: Negative.   Endo/Heme/Allergies: Negative.   Psychiatric/Behavioral: Negative.      Past Medical History:  Diagnosis Date   Arthritis    Cancer ([place])    Diabetes mellitus    Esophagitis    Goals of care, counseling/discussion 05/23/2021   Hiatal hernia    History of radiation therapy    Left Lung- 11/27/21-01/08/22- Dr. 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[place]) 06/06/2021   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[place]) 06/06/2021   Small cell lung cancer, left upper lobe ([place]) 06/06/2021   Thyroid disease    TMJ (sprain of temporomandibular joint)     Past Surgical History:  Procedure Laterality Date   ABDOMINAL HYSTERECTOMY     BACK SURGERY     x3   BACK SURGERY     x 5 , 2 neck surgeries, 2 lower back surgeries   BRONCHIAL BIOPSY  06/02/2021   Procedure: BRONCHIAL BIOPSIES;  Surgeon: Collene Gobble, MD;  Location: [place] [place];  Service: Pulmonary;;   BRONCHIAL BRUSHINGS  06/02/2021  Procedure: BRONCHIAL BRUSHINGS;  Surgeon: Collene Gobble, MD;  Location: [facility] ENDOSCOPY;  Service: Pulmonary;;   BRONCHIAL NEEDLE ASPIRATION BIOPSY  06/02/2021   Procedure: BRONCHIAL NEEDLE ASPIRATION BIOPSIES;  Surgeon: Collene Gobble, MD;  Location: [facility] ENDOSCOPY;  Service: Pulmonary;;   CHOLECYSTECTOMY     COLONOSCOPY     ERCP     ESOPHAGOGASTRODUODENOSCOPY     IR IMAGING GUIDED PORT INSERTION  06/13/2021   THYROID SURGERY     TONSILECTOMY, ADENOIDECTOMY, BILATERAL MYRINGOTOMY AND TUBES     VIDEO BRONCHOSCOPY WITH ENDOBRONCHIAL ULTRASOUND Left 06/02/2021   Procedure: VIDEO BRONCHOSCOPY WITH ENDOBRONCHIAL ULTRASOUND;  Surgeon: Collene Gobble, MD;  Location: [facility]  ENDOSCOPY;  Service: Pulmonary;  Laterality: Left;     reports that she quit smoking about 5 years ago. Her smoking use included cigarettes. She has never used smokeless tobacco. She reports that she does not drink alcohol and does not use drugs.  Allergies  Allergen Reactions   Lyrica [Pregabalin] Other (See Comments)    Hallucinations    Crestor [Rosuvastatin Calcium] Other (See Comments)    Leg cramps    Family History  Problem Relation Age of Onset   Stroke Mother    Alcohol abuse Mother    Cancer Brother        leukemia   Stroke Maternal Grandmother    Heart disease Maternal Grandfather    Cancer Daughter        breast cancer   Breast cancer Daughter    Breast cancer Cousin    Colon cancer Neg Hx    Esophageal cancer Neg Hx    Rectal cancer Neg Hx    Stomach cancer Neg Hx     Prior to Admission medications   Medication Sig Start Date End Date Taking? Authorizing Provider  acetaminophen (TYLENOL) 500 MG tablet Take 1,000 mg by mouth every 6 (six) hours as needed for moderate pain.    [provider]  albuterol (VENTOLIN HFA) 108 (90 Base) MCG/ACT inhaler Inhale 2 puffs into the lungs every 6 (six) hours as needed for wheezing or shortness of breath. Patient not taking: Reported on 02/20/2022 04/02/20   Colon Branch, MD  ALPRAZolam Duanne Moron) 0.5 MG tablet TAKE ONE TABLET BY MOUTH THREE TIMES A DAY AS NEEDED 02/06/22   Roma Schanz R, DO  blood glucose meter kit and supplies KIT Use daily 05/19/21   Carollee Herter, Alferd Apa, DO  Blood Glucose Monitoring Suppl (ONE TOUCH ULTRA 2) w/Device KIT Check blood sugar as directed. Dx:E11.65 09/26/18   Roma Schanz R, DO  Blood Glucose Monitoring Suppl (ONETOUCH VERIO FLEX SYSTEM) w/Device KIT USE AS DIRECTED TO CHECK BLOOD SUGAR FOUR TIMES A DAY 11/30/19   Carollee Herter, Alferd Apa, DO  chlorhexidine (PERIDEX) 0.12 % solution Use as directed 15 mLs in the mouth or throat 2 (two) times daily. Patient not taking: Reported on  11/19/2021 08/18/21   Celso Amy, NP  fentaNYL (DURAGESIC) 12 MCG/HR Place 1 patch onto the skin every 3 (three) days. Patient not taking: Reported on 11/19/2021 06/06/21   [name], MD  FLUoxetine (PROZAC) 20 MG capsule Take 1 capsule (20 mg total) by mouth daily. 02/21/21   Roma Schanz R, DO  glucose blood (ONETOUCH ULTRA) test strip USE TO CHECK BLOOD SUGAR FOUR TIMES A DAY AS DIRECTED 11/01/20   Carollee Herter, Alferd Apa, DO  insulin glargine (LANTUS SOLOSTAR) 100 UNIT/ML Solostar Pen Inject 30 Units into the skin daily. HS  06/02/21   Collene Gobble, MD  Insulin Pen Needle (NOVOTWIST) 32G X 5 MM MISC Use daily at Bedtime with Lantus 02/17/12   Carollee Herter, Alferd Apa, DO  levothyroxine (SYNTHROID) 125 MCG tablet Take 1 tablet (125 mcg total) by mouth daily before breakfast. 10/09/21   [name], MD  meloxicam (MOBIC) 15 MG tablet Take 1 tablet (15 mg total) by mouth daily. 02/02/22   [name], DO  metFORMIN (GLUCOPHAGE-XR) 500 MG 24 hr tablet Take 1 tablet (500 mg total) by mouth in the morning and at bedtime. 01/12/22   Mosie Lukes, MD  omeprazole (PRILOSEC) 20 MG capsule Take 1 capsule (20 mg total) by mouth daily. 09/02/21   [name], DO  oxyCODONE ER (XTAMPZA ER) 13.5 MG C12A Take 13.5 mg by mouth 2 (two) times daily. Patient not taking: Reported on 02/20/2022 12/11/21   [name], MD  oxyCODONE-acetaminophen (PERCOCET) 7.5-325 MG tablet Take 1 tablet by mouth every 6 (six) hours as needed for severe pain. 02/16/22   [name], MD  sucralfate (CARAFATE) 1 g tablet Take 1 tablet (1 g total) by mouth 4 (four) times daily -  with meals and at bedtime. Crush and dissolve in 10 mL's of warm water prior to swallowing Patient not taking: Reported on 02/20/2022 12/16/21   Gery Pray, MD  TRULICITY 2.84 XL/2.4MW SOPN Inject 0.75 mg into the skin every Saturday. 03/14/21   [provider]  UNABLE TO FIND Please provide hair prothesis for  patient undergoing chemotherapy for cancer diagnosis. 07/02/21   [name], MD  valACYclovir (VALTREX) 1000 MG tablet TAKE ONE TABLET BY MOUTH DAILY AS NEEDED 03/27/20   Carollee Herter, Alferd Apa, DO  prochlorperazine (COMPAZINE) 10 MG tablet Take 1 tablet (10 mg total) by mouth every 6 (six) hours as needed (Nausea or vomiting). Patient not taking: Reported on 11/14/2021 06/09/21 11/14/21  [name], MD    Physical Exam: Vitals:   02/26/22 0030 02/26/22 0100 02/26/22 0130 02/26/22 0252  BP: 136/86 135/85 (!) 134/90 (!) 145/85  Pulse: 92 92 92 95  Resp: _0 Temp:    98.8 F (37.1 C)  TempSrc:    Oral  SpO2: 92% 92% 92% 93%  Weight:      Height:        Physical Exam Vitals and nursing note reviewed.  Constitutional:      General: She is not in acute distress.    Appearance: She is not ill-appearing, toxic-appearing or diaphoretic.  HENT:     Head: Normocephalic and atraumatic.     Nose: Nose normal.  Eyes:     General: No scleral icterus. Cardiovascular:     Rate and Rhythm: Normal rate and regular rhythm.     Pulses: Normal pulses.  Pulmonary:     Effort: Pulmonary effort is normal.     Comments: Dry rales in bases Abdominal:     General: Abdomen is flat. Bowel sounds are normal. There is no distension.     Palpations: Abdomen is soft.     Tenderness: There is no abdominal tenderness. There is no guarding.  Musculoskeletal:     Comments: Port a cath right ant chest wall  Skin:    General: Skin is warm and dry.  Neurological:     General: No focal deficit present.     Mental Status: She is alert and oriented to person, place, and time.  Labs on Admission: I have personally reviewed following labs and imaging studies  CBC: Recent Labs  Lab 02/25/22 1656  WBC 6.2  NEUTROABS 4.8  HGB 11.1*  HCT 34.4*  MCV 89.6  PLT 106   Basic Metabolic Panel: Recent Labs  Lab 02/25/22 1656  NA 130*  K 4.1  CL 93*  CO2 27  GLUCOSE 462*  BUN 16   CREATININE 0.75  CALCIUM 8.5*   GFR: Estimated Creatinine Clearance: 45.2 mL/min (by C-G formula based on SCr of 0.75 mg/dL). Liver Function Tests: Recent Labs  Lab 02/25/22 1656  AST 21  ALT 20  ALKPHOS 64  BILITOT 0.7  PROT 8.0  ALBUMIN 3.2*   No results for input(s): "LIPASE", "AMYLASE" in the last 168 hours. No results for input(s): "AMMONIA" in the last 168 hours. Coagulation Profile: No results for input(s): "INR", "PROTIME" in the last 168 hours. Cardiac Enzymes: No results for input(s): "CKTOTAL", "CKMB", "CKMBINDEX", "TROPONINI", "TROPONINIHS" in the last 168 hours. BNP (last 3 results) No results for input(s): "PROBNP" in the last 8760 hours. HbA1C: No results for input(s): "HGBA1C" in the last 72 hours. CBG: No results for input(s): "GLUCAP" in the last 168 hours. Lipid Profile: No results for input(s): "CHOL", "HDL", "LDLCALC", "TRIG", "CHOLHDL", "LDLDIRECT" in the last 72 hours. Thyroid Function Tests: No results for input(s): "TSH", "T4TOTAL", "FREET4", "T3FREE", "THYROIDAB" in the last 72 hours. Anemia Panel: No results for input(s): "VITAMINB12", "FOLATE", "FERRITIN", "TIBC", "IRON", "RETICCTPCT" in the last 72 hours. Urine analysis:    Component Value Date/Time   COLORURINE YELLOW 02/25/2022 [place] 02/25/2022 1638   LABSPEC <=1.005 02/25/2022 1638   PHURINE 5.5 02/25/2022 1638   GLUCOSEU >=500 (A) 02/25/2022 1638   HGBUR NEGATIVE 02/25/2022 1638   HGBUR negative 06/04/2010 0817   BILIRUBINUR NEGATIVE 02/25/2022 1638   BILIRUBINUR negative 11/18/2020 1601   KETONESUR 15 (A) 02/25/2022 1638   PROTEINUR NEGATIVE 02/25/2022 1638   UROBILINOGEN 0.2 11/18/2020 1601   UROBILINOGEN negative 06/04/2010 0817   NITRITE NEGATIVE 02/25/2022 1638   LEUKOCYTESUR NEGATIVE 02/25/2022 1638    Radiological Exams on Admission: I have personally reviewed images CT Angio Chest PE W/Cm &/Or Wo Cm  Result Date: 02/25/2022 CLINICAL DATA:   Pulmonary embolus suspected with high probability. EXAM: CT ANGIOGRAPHY CHEST WITH CONTRAST TECHNIQUE: Multidetector CT imaging of the chest was performed using the standard protocol during bolus administration of intravenous contrast. Multiplanar CT image reconstructions and MIPs were obtained to evaluate the vascular anatomy. RADIATION DOSE REDUCTION: This exam was performed according to the departmental dose-optimization program which includes automated exposure control, adjustment of the mA and/or kV according to patient size and/or use of iterative reconstruction technique. CONTRAST:  24m OMNIPAQUE IOHEXOL 350 MG/ML SOLN COMPARISON:  PET-CT 11/03/2021.  CT chest 05/14/2021 FINDINGS: Cardiovascular: Good opacification of the central and segmental pulmonary arteries with technically adequate study. Intraluminal filling defects are demonstrated in segmental and subsegmental right upper lobe pulmonary arteries. This is consistent with acute nonocclusive pulmonary embolus. No additional emboli are demonstrated. RA to RV ratio is normal suggesting no evidence of heart strain. Normal heart size. No pericardial effusions. Normal caliber thoracic aorta. No aortic dissection. Great vessel origins are patent. Mediastinum/Nodes: Thyroid gland is unremarkable. Central venous catheter with tip in the cavoatrial junction. Esophagus is decompressed. Small esophageal hiatal hernia. Lymphadenopathy in the mediastinum with pretracheal lymph nodes measuring up to 1.3 cm in diameter. Lymphadenopathy is similar to previous PET-CT and decreased since prior CT. Lungs/Pleura: Emphysematous  changes in the lungs. Patchy interstitial and ground-glass infiltrates demonstrated in both lungs. Changes are new since previous studies, likely indicating multifocal pneumonia. Edema would be less likely to have this distribution. There is peribronchial thickening with mild bronchiectasis. No pleural effusions. No pneumothorax. Upper Abdomen:  Surgical absence of the gallbladder. Suggestion of prominent periaortic lymph nodes but incompletely included within the field of view. Nodes measure up to 1.1 cm diameter. Abdominal lymphadenopathy may be progressing since previous study. Musculoskeletal: Degenerative changes in the spine. Postoperative changes in the cervical spine. Multiple mixed sclerotic and lucent lesions throughout the thoracic vertebrae consistent with metastatic disease. Similar appearance to previous studies. Review of the MIP images confirms the above findings. IMPRESSION: 1. Positive examination for pulmonary emboli demonstrated focally in right upper lung distal segmental and subsegmental vessels. 2. Developing patchy airspace and interstitial infiltrates in the lungs most likely representing multifocal pneumonia. 3. Prominent mediastinal and abdominal lymph nodes, likely metastatic. 4. Multiple bone metastases again demonstrated. Critical Value/emergent results were called by telephone at the time of interpretation on 02/25/2022 at 7:45 pm to provider Fredia Sorrow , who verbally acknowledged these results. Electronically Signed   By: Lucienne Capers M.D.   On: 02/25/2022 19:53   DG Chest Port 1 View  Result Date: 02/25/2022 CLINICAL DATA:  Shortness of breath EXAM: PORTABLE CHEST 1 VIEW COMPARISON:  Chest x-ray July 25, 2020. FINDINGS: Chronic streaky bibasilar opacities. No confluent consolidation. No visible pleural effusions or pneumothorax. Right IJ approach Port-A-Cath with the tip projecting at the right atrium. Cardiomediastinal silhouette is unchanged. Partially imaged cervical fusion hardware. IMPRESSION: Chronic streaky bibasilar opacities, which may represent atelectasis/scar given unchanged appearance. Superimposed atypical infection is difficult to exclude. No confluent consolidation. Electronically Signed   By: Margaretha Sheffield M.D.   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 Continue with supplemental O2. Doubt her small PE is source of her hypoxia. Most likely is radiation pneumonitis from her XRT she received last month. Pulmonary consult pending. Respiratory viral panel is negative. Pt's last fever was 3 days ago.  Subsegmental pulmonary embolism without acute cor pulmonale (HCC) Small PE. Doubt this would cause cardiac issues. No need for IV heparin. Change over to po eliquis.  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 Followed by oncology. Had 4 sessions of XRT in October 2023. Last session on 01-08-2022. Last received IV chemo July 2023.  Type 2 diabetes mellitus without complication, with long-term current use of insulin (HCC) Stable. Continue with lantus. Add SSI. Hold trulicity.  Hypothyroidism Stable. Continue synthroid 125 mcg daily.  Hyponatremia Check TSH, free T4.  Check Serum osm, urine osm, urine electrolytes patient does have small cell lung cancer.  Hyponatremia could be due to cancer.   DVT prophylaxis: Eliquis Code Status: Full Code Family Communication: no family at bedside  Disposition Plan: return home  Consults called: EDP has consulted pulmonology([place]) Admission status: Observation, Med-Surg   Kristopher Oppenheim, DO [facility] 02/26/2022,  3:36 AM

## 2022-02-26 NOTE — Discharge Instructions (Addendum)
Information on my medicine - ELIQUIS (apixaban)  Why was Eliquis prescribed for you? Eliquis was prescribed to treat blood clots that may have been found in the veins of your legs (deep vein thrombosis) or in your lungs (pulmonary embolism) and to reduce the risk of them occurring again.  What do You need to know about Eliquis ? The starting dose is 10 mg (two 5 mg tablets) taken TWICE daily for the FIRST SEVEN (7) DAYS, then on 03/05/22  the dose is reduced to ONE 5 mg tablet taken TWICE daily.  Eliquis may be taken with or without food.   Try to take the dose about the same time in the morning and in the evening. If you have difficulty swallowing the tablet whole please discuss with your pharmacist how to take the medication safely.  Take Eliquis exactly as prescribed and DO NOT stop taking Eliquis without talking to the doctor who prescribed the medication.  Stopping may increase your risk of developing a new blood clot.  Refill your prescription before you run out.  After discharge, you should have regular check-up appointments with your healthcare provider that is prescribing your Eliquis.    What do you do if you miss a dose? If a dose of ELIQUIS is not taken at the scheduled time, take it as soon as possible on the same day and twice-daily administration should be resumed. The dose should not be doubled to make up for a missed dose.  Important Safety Information A possible side effect of Eliquis is bleeding. You should call your healthcare provider right away if you experience any of the following: Bleeding from an injury or your nose that does not stop. Unusual colored urine (red or dark brown) or unusual colored stools (red or black). Unusual bruising for unknown reasons. A serious fall or if you hit your head (even if there is no bleeding).  Some medicines may interact with Eliquis and might increase your risk of bleeding or clotting while on Eliquis. To help avoid this,  consult your healthcare provider or pharmacist prior to using any new prescription or non-prescription medications, including herbals, vitamins, non-steroidal anti-inflammatory drugs (NSAIDs) and supplements.  This website has more information on Eliquis (apixaban): http://www.eliquis.com/eliquis/home

## 2022-02-26 NOTE — Assessment & Plan Note (Addendum)
-  Patient with no overt bleeding. -Anemia likely secondary to acute illness and radiation treatment. -Follow H&H. -Transfusion threshold hemoglobin < 7

## 2022-02-26 NOTE — Hospital Course (Addendum)
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  She was found to be febrile and hypoxic on workup. COVID, flu, and RVP were negative on admission. CTA chest on admission was notable for PE involving right upper lung distal segmental and subsegmental vessels.  There was also concern for underlying airspace disease concerning for multifocal pneumonia. Pulmonology was consulted. Oxygen was slowly weaned throughout hospitalization. She was treated with anticoagulation which was transitioned to Lovenox per oncology recommendations.  Steroid taper also continued at discharge.  Antibiotics were completed during hospitalization.

## 2022-02-26 NOTE — Assessment & Plan Note (Addendum)
-   A1c 10.4% on 02/26/2022 - Continue home regimen -Expected elevations in setting of steroid use as well

## 2022-02-26 NOTE — Progress Notes (Signed)
NAME:  [name], MRN:  [id number], DOB:  1950/06/17, LOS: 0 ADMISSION DATE:  02/25/2022, CONSULTATION DATE:  02/26/22 REFERRING MD:  Irine Seal, MD CHIEF COMPLAINT:  Acute hypoxemic respiratory failure   History of Present Illness:  71 year old female former smoker (50 pack-years, quit 2018) who presented to the ED and found with hypoxemia. PTA had 1 week URI associated with myalgias , chills, fever and urinary frequency and diarrhea. COVID neg at home. She continued to feel poorly and presented to urgent care with SpO2 89% so was advised to present to the ED. CTA demonstrated small segmental and segmental PE in RUL and patchy infiltrates bilaterally concerning for pneumonia. RVP and COVID/influenza neg. Started on supplemental oxygen, ceftriaxone and azithromycin and heparin. PCCM consulted.  She reports she is active at baseline with no limitations in activity. Followed by Dr. Lamonte Sakai for her diagnosis of lung cancer but did not need any bronchodilators. Last radiation treatment was in October 2023. No immediate issues post-procedure. URI symptoms one week ago as noted above. No wheezing or productive cough.  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 Hiatal hernia. Unchanged lymphadenopathy. Emphysema. Patchy GGO bilaterally. Prior bone mets seen.  Interim History / Subjective:  As above  Objective   Blood pressure 121/77, pulse 92, temperature 98.4 F (36.9 C), temperature source Oral, resp. rate 18, height 4' 11.5" (1.511 m), weight 50.8 kg, SpO2 97 %.    FiO2 (%):  [100 %] 100 %    Intake/Output Summary (Last 24 hours) at 02/26/2022 1338 Last data filed at 02/26/2022 1000 Gross per 24 hour  Intake 2000.72 ml  Output 500 ml  Net 1500.72 ml   Filed Weights   02/25/22 1557 02/26/22 0500  Weight: 53.1 kg 50.8 kg    Physical Exam: General: Well-appearing, no acute distress HENT: [place], AT, OP clear, MMM Eyes: EOMI, no scleral icterus Respiratory: Clear to auscultation bilaterally.  No crackles, wheezing or rales Cardiovascular: RRR, -M/R/G, no JVD Extremities:-Edema,-tenderness Neuro: AAO x4, CNII-XII grossly intact Psych: Normal mood, normal affect   Resolved Hospital Problem list   N/A  Assessment & Plan:   Acute hypoxemic respiratory failure 2/2 multifocal pneumonia +/- subacute radiation pneumonitis Emphysema, minimal/mild - no bronchodilators or O2 at baseline Subsegmental PE Hx metastatic small cell carcinoma of lung s/p chemoradiation - Last XRT 01/2022, last chemo 10/2021  History and CT imaging reviewed. Respiratory failure in this clinic scenario likely driven by acute infectious process. Cannot rule out radiation pneumonitis though less convincing with bilateral lung L>R involvement when radiation was directed only at left lung. Still reasonable to start treatment with prednisone for potential pneumonitis with plan to closely follow-up in Pulmonary clinic with taper ~12 days starting at 40 mg. Please ambulate prior to discharge to determine home O2 needs. Continue antibiotics for CAP coverage for 5-7 days.  If symptoms worsen with above regimen would recommend restarting prednisone taper at 40 mg x 2 weeks, followed by taper over 3 weeks. Will need to monitor based on clinically symptoms if slower taper indicated.  Pulmonary will continue to follow intermittently while inpatient. Staff message will be sent to schedule patient  for follow-up in 1 week.   Labs   CBC: Recent Labs  Lab 02/25/22 1656 02/26/22 0333 02/26/22 1155  WBC 6.2 6.0 6.6   NEUTROABS 4.8  --   --   HGB 11.1* 9.3* 9.7*  HCT 34.4* 29.1* 30.0*  MCV 89.6 91.2 90.1  PLT 241 205 563    Basic Metabolic Panel: Recent Labs  Lab 02/25/22 1656 02/26/22 1155  NA 130* 134*  K 4.1 3.4*  CL 93* 102  CO2 27 26  GLUCOSE 462* 236*  BUN 16 11  CREATININE 0.75 0.54  CALCIUM 8.5* 8.3*  MG  --  1.5*  PHOS  --  1.7*   GFR: Estimated Creatinine Clearance: 45.2 mL/min (by C-G formula based on SCr of 0.54 mg/dL). Recent Labs  Lab 02/25/22 1656 02/26/22 0333 02/26/22 1155  WBC 6.2 6.0 6.6  LATICACIDVEN 2.1* 0.8  --     Liver Function Tests: Recent Labs  Lab 02/25/22 1656 02/26/22 1155  AST 21  --   ALT 20  --   ALKPHOS 64  --   BILITOT 0.7  --   PROT 8.0  --   ALBUMIN 3.2* 2.9*   No results for input(s): "LIPASE", "AMYLASE" in the last 168 hours. No results for input(s): "AMMONIA" in the last 168 hours.  ABG No results found for: "PHART", "PCO2ART", "PO2ART", "HCO3", "TCO2", "ACIDBASEDEF", "O2SAT"   Coagulation Profile: No results for input(s): "INR", "PROTIME" in the last 168 hours.  Cardiac Enzymes: No results for input(s): "CKTOTAL", "CKMB", "CKMBINDEX", "TROPONINI" in the last 168 hours.  HbA1C: Hgb A1c MFr Bld  Date/Time Value Ref Range Status  02/21/2021 10:07 AM 11.3 Repeated and verified X2. (H) 4.6 - 6.5 % Final    Comment:    Glycemic Control Guidelines for People with Diabetes:Non Diabetic:  <6%Goal of Therapy: <7%Additional Action Suggested:  >8%   07/03/2019 08:58 AM 10.5 (H) 4.6 - 6.5 % Final    Comment:    Glycemic Control Guidelines for People with Diabetes:Non Diabetic:  <6%Goal of Therapy: <7%Additional Action Suggested:  >8%     CBG: Recent Labs  Lab 02/26/22 0801 02/26/22 1125  GLUCAP 267* 246*    Review of Systems:   Review of Systems  Constitutional:  Positive for chills, fever and malaise/fatigue. Negative for diaphoresis and weight loss.  HENT:  Negative for congestion.   Respiratory:  Positive for cough  and shortness of breath. Negative for hemoptysis, sputum production and wheezing.   Cardiovascular:  Negative for chest pain, palpitations and leg swelling.     Past Medical History:  She,  has a past medical history of Arthritis, Cancer ([place]), Diabetes mellitus, Esophagitis, Goals of care, counseling/discussion (05/23/2021), Hiatal hernia, History of radiation therapy, Hx of colonic polyps ssp and adenoma (03/06/2018), Hypertension, Hypothyroidism, OSA on CPAP, Sleep apnea, 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[place]) (06/06/2021), 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[place]) (06/06/2021), Small cell lung cancer, left upper lobe ([place]) (06/06/2021), Thyroid disease, and TMJ (sprain of temporomandibular joint).   Surgical History:   Past Surgical History:  Procedure Laterality Date   ABDOMINAL HYSTERECTOMY     BACK SURGERY     x3   BACK SURGERY     x 5 , 2 neck surgeries, 2 lower back surgeries   BRONCHIAL BIOPSY  06/02/2021   Procedure: BRONCHIAL BIOPSIES;  Surgeon: Collene Gobble, MD;  Location: [facility] ENDOSCOPY;  Service: Pulmonary;;   BRONCHIAL BRUSHINGS  06/02/2021  Procedure: BRONCHIAL BRUSHINGS;  Surgeon: Collene Gobble, MD;  Location: [facility] ENDOSCOPY;  Service: Pulmonary;;   BRONCHIAL NEEDLE ASPIRATION BIOPSY  06/02/2021   Procedure: BRONCHIAL NEEDLE ASPIRATION BIOPSIES;  Surgeon: Collene Gobble, MD;  Location: [facility] ENDOSCOPY;  Service: Pulmonary;;   CHOLECYSTECTOMY     COLONOSCOPY     ERCP     ESOPHAGOGASTRODUODENOSCOPY     IR IMAGING GUIDED PORT INSERTION  06/13/2021   THYROID SURGERY     TONSILECTOMY, ADENOIDECTOMY, BILATERAL MYRINGOTOMY AND TUBES     VIDEO BRONCHOSCOPY WITH ENDOBRONCHIAL ULTRASOUND Left 06/02/2021   Procedure: VIDEO BRONCHOSCOPY WITH ENDOBRONCHIAL ULTRASOUND;  Surgeon: Collene Gobble, MD;  Location: [facility] ENDOSCOPY;  Service: Pulmonary;  Laterality: Left;     Social History:   reports that she quit smoking about 5 years  ago. Her smoking use included cigarettes. She has never used smokeless tobacco. She reports that she does not drink alcohol and does not use drugs.   Family History:  Her family history includes Alcohol abuse in her mother; Breast cancer in her cousin and daughter; Cancer in her brother and daughter; Heart disease in her maternal grandfather; Stroke in her maternal grandmother and mother. There is no history of Colon cancer, Esophageal cancer, Rectal cancer, or Stomach cancer.   Allergies Allergies  Allergen Reactions   Lyrica [Pregabalin] Other (See Comments)    Hallucinations    Crestor [Rosuvastatin Calcium] Other (See Comments)    Leg cramps     Home Medications  Prior to Admission medications   Medication Sig Start Date End Date Taking? Authorizing Provider  acetaminophen (TYLENOL) 500 MG tablet Take 1,000 mg by mouth every 6 (six) hours as needed for moderate pain.   Yes [provider]  ALPRAZolam (XANAX) 0.5 MG tablet TAKE ONE TABLET BY MOUTH THREE TIMES A DAY AS NEEDED Patient taking differently: Take 0.5 mg by mouth 3 (three) times daily as needed for anxiety. 02/06/22  Yes Roma Schanz R, DO  FLUoxetine (PROZAC) 20 MG capsule Take 1 capsule (20 mg total) by mouth daily. 02/21/21  Yes Roma Schanz R, DO  insulin glargine (LANTUS SOLOSTAR) 100 UNIT/ML Solostar Pen Inject 30 Units into the skin daily. HS 06/02/21  Yes Collene Gobble, MD  levothyroxine (SYNTHROID) 125 MCG tablet Take 1 tablet (125 mcg total) by mouth daily before breakfast. 10/09/21  Yes Ennever, Rudell Cobb, MD  meloxicam (MOBIC) 15 MG tablet Take 1 tablet (15 mg total) by mouth daily. 02/02/22  Yes Roma Schanz R, DO  metFORMIN (GLUCOPHAGE-XR) 500 MG 24 hr tablet Take 1 tablet (500 mg total) by mouth in the morning and at bedtime. 01/12/22  Yes Mosie Lukes, MD  omeprazole (PRILOSEC) 20 MG capsule Take 1 capsule (20 mg total) by mouth daily. Patient taking differently: Take 20 mg by mouth  daily as needed (indigestion). 09/02/21  Yes [name], DO  oxyCODONE ER (XTAMPZA ER) 13.5 MG C12A Take 13.5 mg by mouth 2 (two) times daily. Patient taking differently: Take 13.5 mg by mouth 2 (two) times daily as needed (pain). 12/11/21  Yes Ennever, Rudell Cobb, MD  oxyCODONE-acetaminophen (PERCOCET) 7.5-325 MG tablet Take 1 tablet by mouth every 6 (six) hours as needed for severe pain. 02/16/22  Yes [name], MD  TRULICITY 6.96 VE/9.3YB SOPN Inject 0.75 mg into the skin every Saturday. 03/14/21  Yes [provider]  valACYclovir (VALTREX) 1000 MG tablet TAKE ONE TABLET BY MOUTH DAILY AS NEEDED Patient taking differently:  Take 500 mg by mouth daily as needed (prevent outbreaks). 03/27/20  Yes Roma Schanz R, DO  albuterol (VENTOLIN HFA) 108 (90 Base) MCG/ACT inhaler Inhale 2 puffs into the lungs every 6 (six) hours as needed for wheezing or shortness of breath. Patient not taking: Reported on 02/20/2022 04/02/20   Colon Branch, MD  blood glucose meter kit and supplies KIT Use daily 05/19/21   Carollee Herter, Alferd Apa, DO  Blood Glucose Monitoring Suppl (ONE TOUCH ULTRA 2) w/Device KIT Check blood sugar as directed. Dx:E11.65 09/26/18   Roma Schanz R, DO  Blood Glucose Monitoring Suppl (ONETOUCH VERIO FLEX SYSTEM) w/Device KIT USE AS DIRECTED TO CHECK BLOOD SUGAR FOUR TIMES A DAY 11/30/19   Carollee Herter, Alferd Apa, DO  chlorhexidine (PERIDEX) 0.12 % solution Use as directed 15 mLs in the mouth or throat 2 (two) times daily. Patient not taking: Reported on 11/19/2021 08/18/21   Celso Amy, NP  glucose blood [facility] ULTRA) test strip USE TO CHECK BLOOD SUGAR FOUR TIMES A DAY AS DIRECTED 11/01/20   Carollee Herter, Alferd Apa, DO  Insulin Pen Needle (NOVOTWIST) 32G X 5 MM MISC Use daily at Bedtime with Lantus 02/17/12   Carollee Herter, Alferd Apa, DO  sucralfate (CARAFATE) 1 g tablet Take 1 tablet (1 g total) by mouth 4 (four) times daily -  with meals and at bedtime. Crush and  dissolve in 10 mL's of warm water prior to swallowing Patient not taking: Reported on 02/20/2022 12/16/21   Gery Pray, MD  UNABLE TO FIND Please provide hair prothesis for patient undergoing chemotherapy for cancer diagnosis. 07/02/21   [name], MD  prochlorperazine (COMPAZINE) 10 MG tablet Take 1 tablet (10 mg total) by mouth every 6 (six) hours as needed (Nausea or vomiting). Patient not taking: Reported on 11/14/2021 06/09/21 11/14/21  [name], MD     Critical care time: N/A    Care Time: 22 min  Rodman Pickle, M.D. [facility] Pulmonary/Critical Care Medicine 02/26/2022 5:10 PM   See Amion for personal pager For hours between 7 PM to 7 AM, please call Elink for urgent questions

## 2022-02-26 NOTE — Progress Notes (Signed)
Mobility Specialist - Progress Note  Pre-mobility: 91% SpO2 During mobility: 88% SpO2 Post-mobility: 92% SPO2   02/26/22 0932  Oxygen Therapy  O2 Device Nasal Cannula  O2 Flow Rate (L/min) 2 L/min  Mobility  Activity Ambulated independently to bathroom;Ambulated independently in hallway  Level of Assistance Standby assist, set-up cues, supervision of patient - no hands on  Assistive Device None  Distance Ambulated (ft) 250 ft  Range of Motion/Exercises Active  Activity Response Tolerated well  Mobility Referral Yes  $Mobility charge 1 Mobility   Pt was found in bed and agreeable to ambulate. Had no complaints during session and at EOS returned to bed with necessities in reach.  Ferd Hibbs Mobility Specialist

## 2022-02-26 NOTE — Assessment & Plan Note (Addendum)
-   etiology presumed due to underlying PNA, radiation pneumonitis and/or some contribution from PE (though lower suspicion) - weaning off O2 prior to discharge -O2 89% with ambulating (borderline); repeat ambulating O2 tomorrow

## 2022-02-26 NOTE — Assessment & Plan Note (Addendum)
-  continue synthroid 

## 2022-02-26 NOTE — Progress Notes (Signed)
[name]   DOB:1950/11/09   [id number]    ASSESSMENT & PLAN:  Small cell lung cancer The patient is admitted due to evidence of pulmonary emboli as well as likely healthcare associated pneumonia Size of the mediastinal mass has improved Continue supportive care  Pulmonary emboli Agree with anticoagulation therapy with oral agents She has no signs or symptoms of bleeding  Fever, likely secondary to pneumonia Monitor fever curve and all cultures closely Agree with antibiotic therapy  Poorly controlled diabetes According to the patient, this is related to recent illness She will continue insulin and metformin   Severe constipation I will add scheduled MiraLAX and Senokot  Normocytic anemia Likely due to illness and recent radiation treatment Observe closely  Discharge planning I anticipate she will be here for the next 24 to 48 hours until fever resolves I will update Dr. Marin Olp to round on her tomorrow  All questions were answered. The patient knows to call the clinic with any problems, questions or concerns.   The total time spent in the appointment was 55 minutes encounter with patients including review of chart and various tests results, discussions about plan of care and coordination of care plan  Heath Lark, MD 02/26/2022 7:52 AM  Subjective:  I was notified by the emergency room physician last night about her admission.  The patient has extensive stage small cell lung cancer with bony metastasis.  She has completed chemotherapy along with radiation treatment recently.  Her last treatment with atezolizumab was in July.  She underwent radiation therapy between August 24 to October 5.  Her treatment course was complicated by poor appetite, fatigue and weight loss.  Her last outpatient visit with medical oncologist was on October 30.  The plan was for her to undergo PET/CT imaging next week for objective assessment of response to therapy She started to have fever and  feeling unwell for the last few days culminating to ER visit.  In the emergency room, she was noted to have mild hypoxemia.  Prior to that, the patient was evaluated by primary care doctor on November 17 and was diagnosed with viral illness. CT imaging revealed evidence of pulmonary emboli as well as possible healthcare associated pneumonia.  She was admitted and was placed on IV heparin along with IV antibiotics as well as oxygen This morning, she felt a little better  Objective:  Vitals:   02/26/22 0252 02/26/22 0645  BP: (!) 145/85 129/78  Pulse: 95 (!) 103  Resp: 19 20  Temp: 98.8 F (37.1 C) 98.9 F (37.2 C)  SpO2: 93% 95%     Intake/Output Summary (Last 24 hours) at 02/26/2022 0752 Last data filed at 02/26/2022 0600 Gross per 24 hour  Intake 1760.72 ml  Output 200 ml  Net 1560.72 ml    GENERAL:alert, no distress and comfortable SKIN: skin color, texture, turgor are normal, no rashes or significant lesions EYES: normal, Conjunctiva are pink and non-injected, sclera clear OROPHARYNX:no exudate, no erythema and lips, buccal mucosa, and tongue normal  NECK: supple, thyroid normal size, non-tender, without nodularity LYMPH:  no palpable lymphadenopathy in the cervical, axillary or inguinal LUNGS: Noted bibasilar crackles with normal breathing effort.  Oxygen in situ HEART: regular rate & rhythm and no murmurs and no lower extremity edema ABDOMEN:abdomen soft, non-tender and normal bowel sounds Musculoskeletal:no cyanosis of digits and no clubbing  NEURO: alert & oriented x 3 with fluent speech, no focal motor/sensory deficits   Labs:  Recent Labs  12/22/21 1300 02/02/22 1025 02/25/22 1656  NA 140 136 130*  K 4.2 3.9 4.1  CL 104 100 93*  CO2 28 29 27   GLUCOSE 180* 256* 462*  BUN 20 12 16   CREATININE 0.66 0.67 0.75  CALCIUM 8.9 9.5 8.5*  GFRNONAA >60 >60 >60  PROT 7.1 7.2 8.0  ALBUMIN 3.9 4.2 3.2*  AST 12* 14* 21  ALT 9 14 20   ALKPHOS 49 52 64  BILITOT 0.2*  0.4 0.7    Studies: I have personally reviewed her CT imaging CT Angio Chest PE W/Cm &/Or Wo Cm  Result Date: 02/25/2022 CLINICAL DATA:  Pulmonary embolus suspected with high probability. EXAM: CT ANGIOGRAPHY CHEST WITH CONTRAST TECHNIQUE: Multidetector CT imaging of the chest was performed using the standard protocol during bolus administration of intravenous contrast. Multiplanar CT image reconstructions and MIPs were obtained to evaluate the vascular anatomy. RADIATION DOSE REDUCTION: This exam was performed according to the departmental dose-optimization program which includes automated exposure control, adjustment of the mA and/or kV according to patient size and/or use of iterative reconstruction technique. CONTRAST:  53mL OMNIPAQUE IOHEXOL 350 MG/ML SOLN COMPARISON:  PET-CT 11/03/2021.  CT chest 05/14/2021 FINDINGS: Cardiovascular: Good opacification of the central and segmental pulmonary arteries with technically adequate study. Intraluminal filling defects are demonstrated in segmental and subsegmental right upper lobe pulmonary arteries. This is consistent with acute nonocclusive pulmonary embolus. No additional emboli are demonstrated. RA to RV ratio is normal suggesting no evidence of heart strain. Normal heart size. No pericardial effusions. Normal caliber thoracic aorta. No aortic dissection. Great vessel origins are patent. Mediastinum/Nodes: Thyroid gland is unremarkable. Central venous catheter with tip in the cavoatrial junction. Esophagus is decompressed. Small esophageal hiatal hernia. Lymphadenopathy in the mediastinum with pretracheal lymph nodes measuring up to 1.3 cm in diameter. Lymphadenopathy is similar to previous PET-CT and decreased since prior CT. Lungs/Pleura: Emphysematous changes in the lungs. Patchy interstitial and ground-glass infiltrates demonstrated in both lungs. Changes are new since previous studies, likely indicating multifocal pneumonia. Edema would be less likely  to have this distribution. There is peribronchial thickening with mild bronchiectasis. No pleural effusions. No pneumothorax. Upper Abdomen: Surgical absence of the gallbladder. Suggestion of prominent periaortic lymph nodes but incompletely included within the field of view. Nodes measure up to 1.1 cm diameter. Abdominal lymphadenopathy may be progressing since previous study. Musculoskeletal: Degenerative changes in the spine. Postoperative changes in the cervical spine. Multiple mixed sclerotic and lucent lesions throughout the thoracic vertebrae consistent with metastatic disease. Similar appearance to previous studies. Review of the MIP images confirms the above findings. IMPRESSION: 1. Positive examination for pulmonary emboli demonstrated focally in right upper lung distal segmental and subsegmental vessels. 2. Developing patchy airspace and interstitial infiltrates in the lungs most likely representing multifocal pneumonia. 3. Prominent mediastinal and abdominal lymph nodes, likely metastatic. 4. Multiple bone metastases again demonstrated. Critical Value/emergent results were called by telephone at the time of interpretation on 02/25/2022 at 7:45 pm to provider Fredia Sorrow , who verbally acknowledged these results. Electronically Signed   By: Lucienne Capers M.D.   On: 02/25/2022 19:53   DG Chest Port 1 View  Result Date: 02/25/2022 CLINICAL DATA:  Shortness of breath EXAM: PORTABLE CHEST 1 VIEW COMPARISON:  Chest x-ray July 25, 2020. FINDINGS: Chronic streaky bibasilar opacities. No confluent consolidation. No visible pleural effusions or pneumothorax. Right IJ approach Port-A-Cath with the tip projecting at the right atrium. Cardiomediastinal silhouette is unchanged. Partially imaged cervical fusion hardware. IMPRESSION: Chronic streaky  bibasilar opacities, which may represent atelectasis/scar given unchanged appearance. Superimposed atypical infection is difficult to exclude. No confluent  consolidation. Electronically Signed   By: Margaretha Sheffield M.D.   On: 02/25/2022 16:17

## 2022-02-27 DIAGNOSIS — I2693 Single subsegmental pulmonary embolism without acute cor pulmonale: Secondary | ICD-10-CM | POA: Diagnosis not present

## 2022-02-27 DIAGNOSIS — K5909 Other constipation: Secondary | ICD-10-CM | POA: Diagnosis not present

## 2022-02-27 DIAGNOSIS — J9601 Acute respiratory failure with hypoxia: Secondary | ICD-10-CM | POA: Diagnosis not present

## 2022-02-27 DIAGNOSIS — J189 Pneumonia, unspecified organism: Secondary | ICD-10-CM | POA: Diagnosis not present

## 2022-02-27 LAB — RENAL FUNCTION PANEL
Albumin: 2.8 g/dL — ABNORMAL LOW (ref 3.5–5.0)
Anion gap: 8 (ref 5–15)
BUN: 7 mg/dL — ABNORMAL LOW (ref 8–23)
CO2: 25 mmol/L (ref 22–32)
Calcium: 7.7 mg/dL — ABNORMAL LOW (ref 8.9–10.3)
Chloride: 100 mmol/L (ref 98–111)
Creatinine, Ser: 0.52 mg/dL (ref 0.44–1.00)
GFR, Estimated: >60 mL/min (ref >60)
Glucose, Bld: 283 mg/dL — ABNORMAL HIGH (ref 70–99)
Phosphorus: 3.2 mg/dL (ref 2.5–4.6)
Potassium: 4.8 mmol/L (ref 3.5–5.1)
Sodium: 133 mmol/L — ABNORMAL LOW (ref 135–145)

## 2022-02-27 LAB — CBC WITH DIFFERENTIAL/PLATELET
Abs Immature Granulocytes: 0.03 10*3/uL (ref 0.00–0.07)
Basophils Absolute: 0 10*3/uL (ref 0.0–0.1)
Basophils Relative: 0 %
Eosinophils Absolute: 0 10*3/uL (ref 0.0–0.5)
Eosinophils Relative: 0 %
HCT: 31.8 % — ABNORMAL LOW (ref 36.0–46.0)
Hemoglobin: 10.3 g/dL — ABNORMAL LOW (ref 12.0–15.0)
Immature Granulocytes: 0 %
Lymphocytes Relative: 2 %
Lymphs Abs: 0.3 10*3/uL — ABNORMAL LOW (ref 0.7–4.0)
MCH: 29.5 pg (ref 26.0–34.0)
MCHC: 32.4 g/dL (ref 30.0–36.0)
MCV: 91.1 fL (ref 80.0–100.0)
Monocytes Absolute: 0.4 10*3/uL (ref 0.1–1.0)
Monocytes Relative: 3 %
Neutro Abs: 10.4 10*3/uL — ABNORMAL HIGH (ref 1.7–7.7)
Neutrophils Relative %: 95 %
Platelets: 259 10*3/uL (ref 150–400)
RBC: 3.49 MIL/uL — ABNORMAL LOW (ref 3.87–5.11)
RDW: 12.7 % (ref 11.5–15.5)
WBC: 11 10*3/uL — ABNORMAL HIGH (ref 4.0–10.5)
nRBC: 0 % (ref 0.0–0.2)

## 2022-02-27 LAB — LEGIONELLA PNEUMOPHILA SEROGP 1 UR AG: L. pneumophila Serogp 1 Ur Ag: NEGATIVE

## 2022-02-27 LAB — GLUCOSE, CAPILLARY
Glucose-Capillary: 273 mg/dL — ABNORMAL HIGH (ref 70–99)
Glucose-Capillary: 400 mg/dL — ABNORMAL HIGH (ref 70–99)
Glucose-Capillary: 250 mg/dL — ABNORMAL HIGH (ref 70–99)
Glucose-Capillary: 234 mg/dL — ABNORMAL HIGH (ref 70–99)

## 2022-02-27 LAB — OSMOLALITY, URINE: Osmolality, Ur: 815 mOsm/kg (ref 300–900)

## 2022-02-27 LAB — HEMOGLOBIN A1C
Hgb A1c MFr Bld: 10.4 % — ABNORMAL HIGH (ref 4.8–5.6)
Mean Plasma Glucose: 252 mg/dL

## 2022-02-27 LAB — MAGNESIUM: Magnesium: 2.5 mg/dL — ABNORMAL HIGH (ref 1.7–2.4)

## 2022-02-27 MED ORDER — INSULIN ASPART 100 UNIT/ML IJ SOLN
4.0000 [IU] | Freq: Three times a day (TID) | INTRAMUSCULAR | Status: DC
  Administered 2022-02-27 – 2022-03-12 (×25): 4 [IU] via SUBCUTANEOUS

## 2022-02-27 MED ORDER — INSULIN GLARGINE-YFGN 100 UNIT/ML ~~LOC~~ SOLN
8.0000 [IU] | Freq: Once | SUBCUTANEOUS | Status: AC
  Administered 2022-02-27: 8 [IU] via SUBCUTANEOUS
  Filled 2022-02-27: qty 0.08

## 2022-02-27 MED ORDER — SODIUM CHLORIDE 0.9 % IV SOLN
510.0000 mg | Freq: Once | INTRAVENOUS | Status: AC
  Administered 2022-02-27: 510 mg via INTRAVENOUS
  Filled 2022-02-27: qty 17

## 2022-02-27 MED ORDER — SODIUM CHLORIDE 0.9 % IV SOLN
2.0000 g | Freq: Two times a day (BID) | INTRAVENOUS | Status: DC
  Administered 2022-02-27 – 2022-03-02 (×8): 2 g via INTRAVENOUS
  Filled 2022-02-27 (×8): qty 12.5

## 2022-02-27 MED ORDER — INSULIN GLARGINE-YFGN 100 UNIT/ML ~~LOC~~ SOLN
34.0000 [IU] | Freq: Every day | SUBCUTANEOUS | Status: DC
  Administered 2022-02-27 – 2022-03-03 (×5): 34 [IU] via SUBCUTANEOUS
  Filled 2022-02-27 (×7): qty 0.34

## 2022-02-27 NOTE — Inpatient Diabetes Management (Addendum)
Inpatient Diabetes Program Recommendations  AACE/ADA: New Consensus Statement on Inpatient Glycemic Control (2015)  Target Ranges:  Prepandial:   less than 140 mg/dL      Peak postprandial:   less than 180 mg/dL (1-2 hours)      Critically ill patients:  140 - 180 mg/dL    Latest Reference Range & Units 02/26/22 11:55  Hemoglobin A1C 4.8 - 5.6 % 10.4 (H)  252 mg/dl  (H): Data is abnormally high  Latest Reference Range & Units 02/26/22 08:01 02/26/22 11:25 02/26/22 17:33 02/26/22 21:08  Glucose-Capillary 70 - 99 mg/dL 267 (H)  8 units Novolog  246 (H)  5 units Novolog _0  368 (H)  15 units Novolog  294 (H)  3 units Novolog  30 units Semglee  (H): Data is abnormally high  Latest Reference Range & Units 02/27/22 08:04  Glucose-Capillary 70 - 99 mg/dL 273 (H)  (H): Data is abnormally high    Admit with:  Acute respiratory failure with hypoxia  Subsegmental pulmonary embolism without acute cor pulmonale  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bone, liver, lymph nodes)  History: DM2  Home DM Meds: Lantus 30 units QHS        Metformin 500 mg BID        Trulicity 8.02 mg Qweek  Current Orders: Semglee 34 units QHS     Novolog Moderate Correction Scale/ SSI (0-15 units) TID AC + HS    Starting Prednisone 40 mg daily this AM  Semglee dose increased for tonight (got 30 units last PM)  ENDO: Dr. Buddy Duty with [facility]    MD- Since pt starting Prednisone today, expect her afternoon CBGs to continue to be elevated (were elevated yesterday as well pre-Prednisone)  Please consider starting Novolog Meal Coverage: Novolog 6 units TID with meals HOLD if pt NPO HOLD if pt eats <50% meals   Addendum 3pm--Met w/ pt and family at bedside.  Pt verified home diabetes meds with me (see above).  Has all meds and takes daily as prescribed.  Last saw ENDO Dr. Buddy Duty in April 2023--No changes made to meds at that appt.  Needs to schedule new appt with  Dr. Buddy Duty.  Is interested in starting Dexcom or Freestyle Libre CGM--Discussed with pt and family what each is and how they work.. Dtr at bedside has the [place] CGM.  Encouraged pt to speak with Dr. Buddy Duty about starting CGM at home.  Discussed with pt and family pt's current A1c of 10.4%--Pt and family shocked to hear it's so high--was in the 7% range last time it was checked.  Discussed how current infection, stress, and stress of recent radiation treatments may have affected A1c and drove CBGs upward.  Pt and family appreciative of visit and info and did not have any further questions for me at this time.    --Will follow patient during hospitalization--  Wyn Quaker RN, MSN, [place] Diabetes Coordinator Inpatient Glycemic Control Team Team Pager: [phone] (8a-5p)

## 2022-02-27 NOTE — Telephone Encounter (Signed)
[facility] appt scheduled for pt 12/5. This will show up on pt's AVS once she is discharged from the hospital.

## 2022-02-27 NOTE — Progress Notes (Signed)
Mobility Specialist - Progress Note  Pre-mobility: 92% SpO2 During mobility: 88% SpO2 Post-mobility: 93% SPO2   02/27/22 1332  Oxygen Therapy  O2 Device Nasal Cannula  O2 Flow Rate (L/min) 4 L/min  Mobility  Activity Ambulated independently in hallway  Level of Assistance Standby assist, set-up cues, supervision of patient - no hands on  Assistive Device None  Distance Ambulated (ft) 50 ft  Range of Motion/Exercises Active  Activity Response Tolerated well  Mobility Referral Yes   Pt was found in bed and agreeable to ambulate. Pt had some swaying during ambulation and was very fatigued throughout. At EOS returned to bed with RN in room.  Ferd Hibbs Mobility Specialist

## 2022-02-27 NOTE — Progress Notes (Signed)
Pharmacy Antibiotic Note  [name] is a 71 y.o. female admitted on 02/25/2022 with  PNA vs radiation pneumonitis .  Pharmacy has been consulted for Cefepime dosing.   ID: r/o PNA vs radiation pneumoonitis. Immunocompromised. - Tmax 101.6. WBC 6.6. Scr <1.  IV Azithro 11/22>11/27 Rocephin 11/22>11/23 Cefepime 11/24>>  11/22: BC x 2>> 11/22: RVP: neg 11/22: Flu/COVID: neg  Plan: Cefepime 2g IV q12. Dose ok for renal function Pharmacy will sign off. Please reconsult for further dosing assitance.   Height: 4' 11.5" (151.1 cm) Weight: 52.2 kg (115 lb 1.3 oz) IBW/kg (Calculated) : 44.35  Temp (24hrs), Avg:99.4 F (37.4 C), Min:97.9 F (36.6 C), Max:101.6 F (38.7 C)  Recent Labs  Lab 02/25/22 1656 02/26/22 0333 02/26/22 1155 02/27/22 0316  WBC 6.2 6.0 6.6  --   CREATININE 0.75  --  0.54 0.52  LATICACIDVEN 2.1* 0.8  --   --     Estimated Creatinine Clearance: 45.2 mL/min (by C-G formula based on SCr of 0.52 mg/dL).    Allergies  Allergen Reactions   Lyrica [Pregabalin] Other (See Comments)    Hallucinations    Crestor [Rosuvastatin Calcium] Other (See Comments)    Leg cramps    [name] S. Alford Highland, PharmD, BCPS Clinical Staff Pharmacist [email or web address]  Wayland Salinas 02/27/2022 9:05 AM

## 2022-02-27 NOTE — Progress Notes (Signed)
BSE completed, Full report to follow.  Pt awoke and participated in evaluation.  No focal CN deficits present and she easily passed 3 ounce Yale water challenge.  Pt denies dyshagia - has h/o GERD and takes a PPI.    Observed pt with oral care - indicative of adequate oral control - water, coffee and cheerios with milk.  No indication of dysphagia.  Recommend continue diet with general precautions.   Given recent XRT completed - agree suspect pneumonitis.    Reviewed importance of oral hygiene and general aspiration/reflux precautions due to her h/o CA and current immune compromise using teach back.  All education completed. Thanks.   Kathleen Lime, MS [facility] SLP Acute Rehab Services Office [phone] Pager [phone]

## 2022-02-27 NOTE — Consult Note (Addendum)
[name] is well-known to me.  She is a very charming 71 year old white female.  She has extensive stage small cell lung cancer.  She has received systemic chemotherapy and immunotherapy.  This was back in the spring time.  She has 6 cycles of treatment.  She had a very nice response.  We then gave her some consolidative radiation therapy to the mediastinum where her disease was mostly.  She completed this back in October.  I think this was early October.  She was last seen in the office on 02/02/2022.  She has some fatigue and some mild shortness of breath at that time.  Her weight up and down.  She does did not have much of an appetite.  She subsequently was admitted on 02/25/2022.  At that time, she had shortness of breath.  She had a CT angiogram of the chest done.  She had pulmonary emboli.  She did have a Doppler of her legs.  This was unremarkable.  Also noted was some infiltrate which was felt to be pneumonia.  This infiltrate could also be radiation pneumonitis..  She is on antibiotics.  She is on oral anticoagulation with Eliquis.  She is on Zithromax and Rocephin.  She looks quite tired this morning.  She feels quite fatigued.  She does not feel that great.  Her blood sugars horrible.  Her blood sugar was 283.  Her BUN is 7 creatinine 0.52.Marland Kitchen  She clearly is iron deficient with an iron saturation of 9%.  Her white cell count 6.6.  Hemoglobin 9.7.  Platelet count 226,000.  She does not have much of an appetite.  This really troubles me.  She was scheduled for a PET scan on Monday.  I am unsure if she will be able to have this as I suspect she probably will be in the hospital.  Her overall performance status is ECOG 2.   Her vital signs show temperature of 97.9.  Pulse 108.  Blood pressure 138/84.  Oxygen saturation is 94%.  Weight is 115 pounds.  Her lungs sound somewhat congested.  She has some wheezing over on the right side.  She has decent air movement.  She has some crackles bilaterally.   Cardiac exam tachycardic but regular.  There may be an occasional extra beat.  Oral exam does not show any thrush.  Her abdomen is soft.  Bowel sounds present.  She has no fluid wave.  There is no palpable liver or spleen tip.  Extremity shows no clubbing, cyanosis or edema.  No venous cords noted in the legs.  Neurological exam is nonfocal.  [name] has extensive stage small cell lung cancer.  She has had a nice response.  We now are dealing with pulmonary emboli.  She also has malignant pneumonia.  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  I agree with the use of Eliquis.  I think this is reasonable for her.  Her blood sugars are quite high because of her prednisone.  I just do not feel that she is able to go home right now.  I does think there is too much going on.  She is not eating.  She needs IV iron.  We really need to improve her performance status before I think she would be able to safely go home.  I do appreciate everybody's help with her.  We will follow along and help out any way that we can.   [name], MD  Oswaldo Milian 7:14

## 2022-02-27 NOTE — Progress Notes (Signed)
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  Patient noted prior to admission to have some chills, body aches, fever as high as 101.  Patient noted to have sats of 89% on room air and sent to the ED from urgent care center.  COVID-19 PCR negative, influenza a and B PCR negative, respiratory viral panel negative.  CT chest done with segmental PE, concern for pneumonia.  Patient placed empirically on IV antibiotics, anticoagulation.  PCCM consulted due to concerns for radiation pneumonitis.    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  -Likely multifactorial secondary to pneumonia in a patient with immunosuppression, concern for radiation pneumonitis and also noted to have segmental PE which is unlikely source of hypoxia.   -COVID-19 PCR negative, influenza AMB negative, respiratory viral panel negative.   -CT angiogram chest with pulmonary emboli focal in the right upper lung distal segmental and subsegmental vessels, developing patchy airspace interstitial infiltrates  most likely representing multifocal pneumonia, prominent mediastinal and abdominal lymph nodes likely metastatic, multiple bone mets.   -Patient with complaints of chills and fatigue.  Noted to have a Tmax of 101.6 this morning. -Urine strep pneumococcus antigen negative.  Urine Legionella antigen negative. -Due to Tmax of 101.6 will broaden antibiotic coverage to IV cefepime.  Continue IV azithromycin.  Discontinue IV Rocephin.   -Patient pancultured.  -Continue scheduled DuoNebs, Claritin, Dulera, Incruse, PPI -Continue anticoagulation with Eliquis. -PCCM consulted due to concerns for radiation pneumonitis and patient started on a prednisone taper. -Supportive care. -Ambulatory sats today of 92% on 4 L nasal cannula, 88% on ambulation.  Subsegmental pulmonary embolism without acute cor pulmonale (HCC) Small PE. Doubt this would cause cardiac issues.  -Patient initially placed on IV heparin in the ED and has been transitioned to Eliquis.  -Hematology/oncology following.  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 Followed by oncology. Had 4 sessions of XRT in October 2023. Last session on 01-08-2022. Last received IV chemo July 2023. -Oncology following.  Type 2 diabetes mellitus without complication, with long-term current use of insulin (HCC) Stable.  -Hemoglobin A1c 11.3 (02/21/2021 ) -Repeat hemoglobin A1c 10.4 (02/26/2022). -CBG 273 this morning.  -Elevated CBG secondary to steroids. -Increase Semglee to 34 units daily, placed on meal coverage NovoLog 4 units 3 times daily with meals.   -SSI.    Hypothyroidism - Continue Synthroid 125 mcg daily.   Anemia -Patient with no overt bleeding. -Anemia likely secondary to acute illness and radiation treatment. -Hemoglobin stable at 10.3. -Follow H&H. -Transfusion threshold hemoglobin < 7  Hypophosphatemia -Repleted.   -Phosphorus at 3.2.   -Repeat labs in AM.    Hypomagnesemia -Repleted.  Magnesium at 2.5. -Repeat labs in AM.  Hypokalemia -Repleted, potassium of 4.8.   -Magnesium at 2.5. -Repeat labs in AM.  Other constipation -Continue bowel regimen of MiraLAX twice daily, Senokot-S twice daily, Dulcolax suppository.  Hyponatremia - TSH 2.597.  -Free T4  1.28.   -Urine sodium 142, urine potassium 27,  -Concern hyponatremia may be secondary to small cell lung cancer.  -Sodium levels improved currently at 133 this morning.  -Repeat labs in the AM.          DVT prophylaxis: Eliquis Code Status: Full Family Communication: Updated patient.  No family at bedside. Disposition: Likely home when clinically improved, afebrile x 48 hours, cleared by pulmonary and oncology..  Status is: Inpatient Remains inpatient appropriate because: Severity of illness   Consultants:  PCCM: Dr. Loanne Drilling 02/26/2022 Oncology: Dr. Alvy Bimler 02/26/2022  Procedures:  Chest x-ray 02/25/2022 CT angiogram chest 02/25/2022   Antimicrobials:  IV azithromycin 02/25/2022>>>> IV Rocephin 02/25/2022>>>> 02/27/2022 IV cefepime 02/27/2022   Subjective: Laying in bed with multiple blankets over her.  States does not feel too well this morning complaining of chills states she is feeling cold.  Denies any chest pain.  No significant shortness of breath.  No abdominal pain.  Denies any dysuria.  Stated saw her primary oncologist, Dr. Marin Olp this morning.  Patient complaining of fatigue.  Tmax 101.6 this morning.  Objective: Vitals:   02/27/22 0838 02/27/22 1303 02/27/22 1504 02/27/22 1638  BP:  125/81    Pulse:  90    Resp:  18    Temp:  98.7 F (37.1 C)  98.1 F (36.7 C)  TempSrc:  Oral  Oral  SpO2: 95% 96% 95%   Weight:      Height:        Intake/Output Summary (Last 24 hours) at 02/27/2022 2016 Last data filed at 02/27/2022 1300 Gross per 24 hour  Intake 1208.83 ml  Output 852 ml  Net 356.83 ml   Filed Weights   02/25/22 1557 02/26/22 0500  02/27/22 0500  Weight: 53.1 kg 50.8 kg 52.2 kg    Examination:  General exam: NAD.  Port-A-Cath in right upper chest wall with no surrounding erythema, nontender to palpation. Respiratory system: Decreased breath sounds in the bases.  No rhonchi, no crackles, no wheezing.  Fair air movement.  No use of accessory muscles of respiration.    Cardiovascular system: RRR no murmurs rubs or gallops.  No JVD.  No lower extremity edema.  Gastrointestinal system: Abdomen is soft, nontender, nondistended, positive bowel sounds.  No rebound.  No guarding.  Central nervous system: Alert and oriented. No focal neurological deficits. Extremities: Symmetric 5 x 5 power. Skin: No rashes, lesions or ulcers Psychiatry: Judgement and insight appear normal. Mood & affect appropriate.     Data Reviewed:   CBC: Recent Labs  Lab 02/25/22 1656 02/26/22 0333 02/26/22 1155 02/27/22 1749  WBC 6.2 6.0 6.6 11.0*  NEUTROABS 4.8  --   --  10.4*  HGB 11.1* 9.3* 9.7* 10.3*  HCT 34.4* 29.1* 30.0* 31.8*  MCV 89.6 91.2 90.1 91.1  PLT 241 205 226 314    Basic Metabolic Panel: Recent Labs  Lab 02/25/22 1656 02/26/22 1155 02/27/22 0316  NA 130* 134* 133*  K 4.1 3.4* 4.8  CL 93* 102 100  CO2 27 26 25   GLUCOSE 462* 236* 283*  BUN 16 11 7*  CREATININE 0.75 0.54 0.52  CALCIUM 8.5* 8.3* 7.7*  MG  --  1.5* 2.5*  PHOS  --  1.7* 3.2    GFR: Estimated Creatinine Clearance: 45.2 mL/min (by C-G formula based on SCr of 0.52 mg/dL).  Liver Function Tests: Recent Labs  Lab 02/25/22 1656 02/26/22 1155 02/27/22 0316  AST 21  --   --   ALT 20  --   --   ALKPHOS 64  --   --   BILITOT 0.7  --   --   PROT 8.0  --   --   ALBUMIN 3.2* 2.9* 2.8*    CBG: Recent Labs  Lab 02/26/22 1733 02/26/22 2108 02/27/22 0804 02/27/22 1132 02/27/22 1559  GLUCAP 368* 294* 273* 400* 250*     Recent Results (from the past 240 hour(s))  Resp Panel by RT-PCR (Flu A&B, Covid) Anterior Nasal Swab     Status: None    Collection Time: 02/25/22  4:33 PM   Specimen: Anterior Nasal Swab  Result Value Ref Range Status   SARS Coronavirus 2 by RT PCR NEGATIVE NEGATIVE Final    Comment: (NOTE) SARS-CoV-2 target nucleic acids are NOT DETECTED.  The SARS-CoV-2 RNA is generally detectable in upper respiratory specimens during the acute phase of infection. The lowest concentration of SARS-CoV-2 viral copies this assay can detect is 138 copies/mL. A negative result does not preclude SARS-Cov-2 infection and should not be used as the sole basis for treatment or other patient management decisions. A negative result may occur with  improper specimen collection/handling, submission of specimen other than nasopharyngeal swab, presence of viral mutation(s) within the areas targeted by this assay, and inadequate number of viral copies(<138 copies/mL). A negative result must be combined with clinical observations, patient history, and epidemiological information. The expected result is Negative.  Fact Sheet for Patients:  EntrepreneurPulse.com.au  Fact Sheet for Healthcare Providers:  IncredibleEmployment.be  This test is no t yet approved or cleared by the [place] FDA and  has been authorized for detection and/or diagnosis of SARS-CoV-2 by FDA under an Emergency Use Authorization (EUA). This EUA will remain  in effect (meaning this test can be used) for the duration of the COVID-19 declaration under Section 564(b)(1) of the Act, 21 U.S.C.section 360bbb-3(b)(1), unless the authorization is terminated  or revoked sooner.       Influenza A by PCR NEGATIVE NEGATIVE Final   Influenza B by PCR NEGATIVE NEGATIVE Final    Comment: (NOTE) The Xpert Xpress SARS-CoV-2/FLU/RSV plus assay is intended as an aid in the diagnosis of influenza from Nasopharyngeal swab specimens and should not be used as a sole basis for treatment. Nasal washings and aspirates are unacceptable for  Xpert Xpress SARS-CoV-2/FLU/RSV testing.  Fact Sheet for Patients: EntrepreneurPulse.com.au  Fact Sheet for Healthcare Providers: IncredibleEmployment.be  This test is not yet approved or cleared by the [place] FDA and has been authorized for detection and/or diagnosis of SARS-CoV-2 by FDA under an Emergency Use Authorization (EUA). This EUA will remain in effect (meaning this test can be used) for the duration of the COVID-19 declaration under Section 564(b)(1) of the Act, 21 U.S.C. section 360bbb-3(b)(1), unless the authorization is terminated or revoked.  Performed at [facility], [address] [place], [place] [address]   Culture, blood (Routine X 2) w Reflex to ID Panel     Status: None (Preliminary result)   Collection Time: 02/25/22  4:57 PM   Specimen: BLOOD  Result Value Ref  Range Status   Specimen Description   Final    BLOOD BOTTLES DRAWN AEROBIC AND ANAEROBIC LEFT ANTECUBITAL Performed at [facility], [address] [place], [place] [address]    Special Requests   Final    Blood Culture adequate volume Performed at [facility], [address] [place], [place] [address]    Culture   Final    NO GROWTH 2 DAYS Performed at [facility], [place] [address] [place], [place] [address]    Report Status PENDING  Incomplete  Respiratory (~20 pathogens) panel by PCR     Status: None   Collection Time: 02/25/22  9:09 PM   Specimen: Nasopharyngeal Swab; Respiratory  Result Value Ref Range Status   Adenovirus NOT DETECTED NOT DETECTED Final   Coronavirus 229E NOT DETECTED NOT DETECTED Final    Comment: (NOTE) The Coronavirus on the Respiratory Panel, DOES NOT test for the novel  Coronavirus (2019 nCoV)    Coronavirus HKU1 NOT DETECTED NOT DETECTED Final   Coronavirus NL63 NOT DETECTED NOT DETECTED Final   Coronavirus OC43 NOT DETECTED NOT DETECTED Final   Metapneumovirus NOT DETECTED  NOT DETECTED Final   Rhinovirus / Enterovirus NOT DETECTED NOT DETECTED Final   Influenza A NOT DETECTED NOT DETECTED Final   Influenza B NOT DETECTED NOT DETECTED Final   Parainfluenza Virus 1 NOT DETECTED NOT DETECTED Final   Parainfluenza Virus 2 NOT DETECTED NOT DETECTED Final   Parainfluenza Virus 3 NOT DETECTED NOT DETECTED Final   Parainfluenza Virus 4 NOT DETECTED NOT DETECTED Final   Respiratory Syncytial Virus NOT DETECTED NOT DETECTED Final   Bordetella pertussis NOT DETECTED NOT DETECTED Final   Bordetella Parapertussis NOT DETECTED NOT DETECTED Final   Chlamydophila pneumoniae NOT DETECTED NOT DETECTED Final   Mycoplasma pneumoniae NOT DETECTED NOT DETECTED Final    Comment: Performed at [facility], [place]. [address] [place], [place] [address]  Culture, blood (Routine X 2) w Reflex to ID Panel     Status: None (Preliminary result)   Collection Time: 02/26/22 10:27 AM   Specimen: BLOOD  Result Value Ref Range Status   Specimen Description   Final    BLOOD BLOOD LEFT HAND Performed at [facility] [address] [place], [place] [address]    Special Requests   Final    BOTTLES DRAWN AEROBIC AND ANAEROBIC Blood Culture adequate volume Performed at [facility] [address], [place], [place] [address]    Culture   Final    NO GROWTH < 24 HOURS Performed at [facility] [address], [place], [place] [address]    Report Status PENDING  Incomplete         Radiology Studies: VAS [place] LOWER EXTREMITY VENOUS (DVT)  Result Date: 02/26/2022  Lower Venous DVT Study Patient Name:  SHADIAMOND KOSKA  Date of Exam:   02/26/2022 Medical Rec #: [id number]      Accession #:    [id number] Date of Birth: Sep 19, 1950      Patient Gender: F Patient Age:   6 years Exam Location:  [facility] Procedure:      VAS [place] LOWER EXTREMITY VENOUS (DVT) Referring Phys: Irine Seal  --------------------------------------------------------------------------------  Indications: Pulmonary embolism.  Limitations: Poor ultrasound/tissue interface. Comparison Study: No prior studies. Performing Technologist: Oliver Hum RVT  Examination Guidelines: A complete evaluation includes B-mode imaging, spectral Doppler, color Doppler, and power Doppler as  needed of all accessible portions of each vessel. Bilateral testing is considered an integral part of a complete examination. Limited examinations for reoccurring indications may be performed as noted. The reflux portion of the exam is performed with the patient in reverse Trendelenburg.  +---------+---------------+---------+-----------+----------+--------------+ RIGHT    CompressibilityPhasicitySpontaneityPropertiesThrombus Aging +---------+---------------+---------+-----------+----------+--------------+ CFV      Full           Yes      Yes                                 +---------+---------------+---------+-----------+----------+--------------+ SFJ      Full                                                        +---------+---------------+---------+-----------+----------+--------------+ FV Prox  Full                                                        +---------+---------------+---------+-----------+----------+--------------+ FV Mid   Full                                                        +---------+---------------+---------+-----------+----------+--------------+ FV DistalFull                                                        +---------+---------------+---------+-----------+----------+--------------+ PFV      Full                                                        +---------+---------------+---------+-----------+----------+--------------+ POP      Full           Yes      Yes                                  +---------+---------------+---------+-----------+----------+--------------+ PTV      Full                                                        +---------+---------------+---------+-----------+----------+--------------+ PERO     Full                                                        +---------+---------------+---------+-----------+----------+--------------+   +---------+---------------+---------+-----------+----------+--------------+  LEFT     CompressibilityPhasicitySpontaneityPropertiesThrombus Aging +---------+---------------+---------+-----------+----------+--------------+ CFV      Full           Yes      Yes                                 +---------+---------------+---------+-----------+----------+--------------+ SFJ      Full                                                        +---------+---------------+---------+-----------+----------+--------------+ FV Prox  Full                                                        +---------+---------------+---------+-----------+----------+--------------+ FV Mid   Full                                                        +---------+---------------+---------+-----------+----------+--------------+ FV DistalFull                                                        +---------+---------------+---------+-----------+----------+--------------+ PFV      Full                                                        +---------+---------------+---------+-----------+----------+--------------+ POP      Full           Yes      Yes                                 +---------+---------------+---------+-----------+----------+--------------+ PTV      Full                                                        +---------+---------------+---------+-----------+----------+--------------+ PERO     Full                                                         +---------+---------------+---------+-----------+----------+--------------+     Summary: RIGHT: - There is no evidence of deep vein thrombosis in the lower extremity.  - No cystic structure found in the popliteal fossa.  LEFT: - There is no evidence of deep vein thrombosis in the lower extremity.  - No  cystic structure found in the popliteal fossa.  *See table(s) above for measurements and observations.    Preliminary         Scheduled Meds:  apixaban  10 mg Oral BID   Followed by   Derrill Memo ON 03/05/2022] apixaban  5 mg Oral BID   bisacodyl  10 mg Rectal Daily   Chlorhexidine Gluconate Cloth  6 each Topical Daily   FLUoxetine  20 mg Oral Daily   fluticasone  2 spray Each Nare Daily   insulin aspart  0-15 Units Subcutaneous TID WC   insulin aspart  0-5 Units Subcutaneous QHS   insulin aspart  4 Units Subcutaneous TID WC   insulin glargine-yfgn  34 Units Subcutaneous QHS   ipratropium-albuterol  3 mL Nebulization TID   levothyroxine  125 mcg Oral Q0600   loratadine  10 mg Oral Daily   mometasone-formoterol  2 puff Inhalation BID   oxyCODONE  10 mg Oral Q12H   pantoprazole  40 mg Oral Daily   polyethylene glycol  17 g Oral Daily   predniSONE  40 mg Oral Q breakfast   Followed by   Derrill Memo ON 03/02/2022] predniSONE  30 mg Oral Q breakfast   Followed by   Derrill Memo ON 03/05/2022] predniSONE  20 mg Oral Q breakfast   Followed by   Derrill Memo ON 03/08/2022] predniSONE  10 mg Oral Q breakfast   senna-docusate  2 tablet Oral BID   Continuous Infusions:  azithromycin Stopped (02/27/22 0054)   ceFEPime (MAXIPIME) IV 2 g (02/27/22 1127)     LOS: 1 day    Time spent: 35 minutes    Irine Seal, MD [facility]   To contact the attending provider between 7A-7P or the covering provider during after hours 7P-7A, please log into the web site [email or web address] and access using universal [place] password for that web site. If you do not have the password, please call the hospital  operator.  02/27/2022, 8:16 PM

## 2022-02-27 NOTE — TOC Initial Note (Signed)
Transition of Care [facility]) - Initial/Assessment Note    Patient Details  Name: [name] MRN: [id number] Date of Birth: 12-28-1950  Transition of Care [facility]) CM/SW Contact:    Henrietta Dine, RN Phone Number: 02/27/2022, 12:31 PM  Clinical Narrative:                  Transition of Care (TOC) Screening Note   Patient Details  Name: [name] Date of Birth: 1950-04-10   Transition of Care [facility]) CM/SW Contact:    Henrietta Dine, RN Phone Number: 02/27/2022, 12:31 PM    Transition of Care Department [facility]) has reviewed patient and no TOC needs have been identified at this time. We will continue to monitor patient advancement through interdisciplinary progression rounds. If new patient transition needs arise, please place a TOC consult.          Patient Goals and CMS Choice        Expected Discharge Plan and Services                                                Prior Living Arrangements/Services                       Activities of Daily Living Home Assistive Devices/Equipment: Eyeglasses ADL Screening (condition at time of admission) Patient's cognitive ability adequate to safely complete daily activities?: Yes Is the patient deaf or have difficulty hearing?: No Does the patient have difficulty seeing, even when wearing glasses/contacts?: No Does the patient have difficulty concentrating, remembering, or making decisions?: No Patient able to express need for assistance with ADLs?: Yes Does the patient have difficulty dressing or bathing?: No Independently performs ADLs?: Yes (appropriate for developmental age) Does the patient have difficulty walking or climbing stairs?: No Weakness of Legs: None Weakness of Arms/Hands: None  Permission Sought/Granted                  Emotional Assessment              Admission diagnosis:  Viral syndrome [B34.9] Hypoxia [R09.02] Acute respiratory failure with hypoxia  (HCC) [J96.01] Multifocal pneumonia [J18.9] Single subsegmental pulmonary embolism without acute cor pulmonale (HCC) [I26.93] Patient Active Problem List   Diagnosis Date Noted   Subsegmental pulmonary embolism without acute cor pulmonale ([place]) 02/26/2022   Hyponatremia 02/26/2022   Other constipation 02/26/2022   Hypokalemia 02/26/2022   Hypomagnesemia 02/26/2022   Hypophosphatemia 02/26/2022   Anemia 02/26/2022   Acute respiratory failure with hypoxia ([place]) 02/25/2022   Gastroenteritis 02/20/2022   History of tobacco use 06/25/2021   Small cell lung cancer, left upper lobe ([place]) 06/06/2021   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[place]) 06/06/2021   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[place]) 06/06/2021   Lung mass 05/28/2021   Goals of care, counseling/discussion 05/23/2021   Abnormal MRI, spine 05/08/2021   Type 2 diabetes mellitus with diabetic peripheral angiopathy without gangrene, without long-term current use of insulin ([place]) 02/21/2021   Anxiety 02/28/2019   Hx of colonic polyps ssp and adenoma 03/06/2018   Sinus headache 05/25/2017   Headache above the eye region 05/25/2017   Obstructive sleep apnea treated with continuous positive airway pressure (CPAP) 01/28/2017   Urinary frequency 11/23/2016   Diabetes mellitus, type II,  insulin dependent ([place]) 11/23/2016   Chronic pansinusitis 10/12/2016   Preventative health care 02/07/2016   Cigarette nicotine dependence with nicotine-induced disorder 07/22/2015   Hypersomnia with sleep apnea 07/22/2015   Snoring 07/22/2015   S/P UPPP (uvulopalatopharyngoplasty) 07/22/2015   Severe frontal headaches 07/22/2015   Chronic ethmoidal sinusitis 07/22/2015   OSA (obstructive sleep apnea) 07/08/2015   Cervical osteoarthritis 11/23/2014   Degeneration of intervertebral disc of cervical region 11/23/2014   Cervical pain 11/23/2014   OE (otitis externa) 12/09/2012   Hypothyroidism  06/04/2010   Type 2 diabetes mellitus without complication, with long-term current use of insulin ([place]) 06/04/2010   Hyperlipidemia LDL goal <70 06/04/2010   OSTEOARTHRITIS 06/04/2010   PCP:  [name], DO Pharmacy:   [facility] [id number] - [place], [place] - [address] [place] [address] [place] [address] Phone: [phone] Fax: [facility] [address] [place] [address] Phone: [phone] Fax: [phone]     Social Determinants of Health (SDOH) Interventions    Readmission Risk Interventions     No data to display

## 2022-02-27 NOTE — Evaluation (Signed)
Clinical/Bedside Swallow Evaluation Patient Details  Name: [name] MRN: [id number] Date of Birth: Feb 13, 1951  Today's Date: 02/27/2022 Time: SLP Start Time (ACUTE ONLY): 0805 SLP Stop Time (ACUTE ONLY): 0828 SLP Time Calculation (min) (ACUTE ONLY): 23 min  Past Medical History:  Past Medical History:  Diagnosis Date   Arthritis    Cancer ([place])    Diabetes mellitus    Esophagitis    Goals of care, counseling/discussion 05/23/2021   Hiatal hernia    History of radiation therapy    Left Lung- 11/27/21-01/08/22- Dr. 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[place]) 06/06/2021   Small cell lung cancer, left upper lobe ([place]) 06/06/2021   Thyroid disease    TMJ (sprain of temporomandibular joint)    Past Surgical History:  Past Surgical History:  Procedure Laterality Date   ABDOMINAL HYSTERECTOMY     BACK SURGERY     x3   BACK SURGERY     x 5 , 2 neck surgeries, 2 lower back surgeries   BRONCHIAL BIOPSY  06/02/2021   Procedure: BRONCHIAL BIOPSIES;  Surgeon: Collene Gobble, MD;  Location: [facility];  Service: Pulmonary;;   BRONCHIAL BRUSHINGS  06/02/2021   Procedure: BRONCHIAL BRUSHINGS;  Surgeon: Collene Gobble, MD;  Location: [facility] ENDOSCOPY;  Service: Pulmonary;;   BRONCHIAL NEEDLE ASPIRATION BIOPSY  06/02/2021   Procedure: BRONCHIAL NEEDLE ASPIRATION BIOPSIES;  Surgeon: Collene Gobble, MD;  Location: [facility] ENDOSCOPY;  Service: Pulmonary;;   CHOLECYSTECTOMY     COLONOSCOPY     ERCP     ESOPHAGOGASTRODUODENOSCOPY     IR IMAGING GUIDED PORT INSERTION  06/13/2021   THYROID SURGERY     TONSILECTOMY, ADENOIDECTOMY, BILATERAL MYRINGOTOMY AND TUBES     VIDEO BRONCHOSCOPY WITH ENDOBRONCHIAL ULTRASOUND Left 06/02/2021   Procedure: VIDEO BRONCHOSCOPY WITH  ENDOBRONCHIAL ULTRASOUND;  Surgeon: Collene Gobble, MD;  Location: [facility] ENDOSCOPY;  Service: Pulmonary;  Laterality: Left;   HPI:  71 yo female adm to [facility] with AMS, weakness, cough - has h/o  lung cancer - dr byrum - last XRT October 2023 and finished chemo early 2023. Imaging concerning for ? Radiation pneumonitis.  Pt reports she went to urgent care but did not have improvement.  Imaging of chest showed h/o distal esophageal thickening circumferential and small HH, ground glass opacities and pt found to have a PE.  She denies dysphagia - states she is on a PPI.  Pt is on RA at home, now on Rocephin.    Assessment / Plan / Recommendation  Clinical Impression  Pt awoke and participated in evaluation.  No focal CN deficits present and she easily passed 3 ounce Yale water challenge.  Pt denies dyshagia - has h/o GERD and takes a PPI.       Observed pt with oral care - indicative of adequate oral control - water, coffee and cheerios with milk.  No indication of dysphagia.  Recommend continue diet with general precautions.   Given recent XRT completed - agree suspect pneumonitis.       Reviewed importance of oral hygiene and general aspiration/reflux precautions due to her h/o CA and current immune compromise using teach back.  All education completed. Thanks. SLP Visit  Diagnosis: Dysphagia, unspecified (R13.10)    Aspiration Risk  Mild aspiration risk    Diet Recommendation Regular;Thin liquid   Liquid Administration via: Cup;Straw Medication Administration: Whole meds with liquid Supervision: Patient able to self feed Compensations: Slow rate;Small sips/bites Postural Changes: Seated upright at 90 degrees;Remain upright for at least 30 minutes after po intake    Other  Recommendations Oral Care Recommendations: Oral care BID    Recommendations for follow up therapy are one component of a multi-disciplinary discharge planning process, led by the attending physician.  Recommendations may be updated  based on patient status, additional functional criteria and insurance authorization.  Follow up Recommendations No SLP follow up      Assistance Recommended at Discharge  none  Functional Status Assessment Patient has not had a recent decline in their functional status  Frequency and Duration     N/a       Prognosis    N/a    Swallow Study   General Date of Onset: 02/27/22 HPI: 71 yo female adm to [facility] with AMS, weakness, cough - has h/o  lung cancer - dr byrum - last XRT October 2023 and finished chemo early 2023. Imaging concerning for ? Radiation pneumonitis.  Pt reports she went to urgent care but did not have improvement.  Imaging of chest showed h/o distal esophageal thickening circumferential and small HH, ground glass opacities and pt found to have a PE.  She denies dysphagia - states she is on a PPI.  Pt is on RA at home, now on Rocephin. Type of Study: Bedside Swallow Evaluation Diet Prior to this Study: Regular;Thin liquids Temperature Spikes Noted: Yes (early am) Respiratory Status: Nasal cannula History of Recent Intubation: No Behavior/Cognition: Alert;Cooperative;Pleasant mood Oral Cavity Assessment: Within Functional Limits Oral Care Completed by SLP: No Oral Cavity - Dentition: Adequate natural dentition Vision: Functional for self-feeding Self-Feeding Abilities: Able to feed self Patient Positioning: Upright in bed Baseline Vocal Quality: Normal Volitional Cough: Strong Volitional Swallow: Able to elicit    Oral/Motor/Sensory Function Overall Oral Motor/Sensory Function: Within functional limits   Ice Chips Ice chips: Not tested   Thin Liquid Thin Liquid: Within functional limits Presentation: Straw;Spoon;Cup    Nectar Thick Nectar Thick Liquid: Not tested   Honey Thick Honey Thick Liquid: Not tested   Puree Puree: Within functional limits Presentation: Self Fed;Spoon   Solid     Solid: Within functional limits Presentation: [place],  Angeni Chaudhuri Ann 02/27/2022,10:33 AM   Kathleen Lime, MS [facility] SLP [place] Office [phone] Pager [phone]

## 2022-02-28 ENCOUNTER — Inpatient Hospital Stay (HOSPITAL_COMMUNITY): Payer: PPO

## 2022-02-28 DIAGNOSIS — K5909 Other constipation: Secondary | ICD-10-CM | POA: Diagnosis not present

## 2022-02-28 DIAGNOSIS — J9601 Acute respiratory failure with hypoxia: Secondary | ICD-10-CM | POA: Diagnosis not present

## 2022-02-28 DIAGNOSIS — J189 Pneumonia, unspecified organism: Secondary | ICD-10-CM | POA: Diagnosis not present

## 2022-02-28 DIAGNOSIS — I2693 Single subsegmental pulmonary embolism without acute cor pulmonale: Secondary | ICD-10-CM | POA: Diagnosis not present

## 2022-02-28 LAB — CBC WITH DIFFERENTIAL/PLATELET
Abs Immature Granulocytes: 0.06 10*3/uL (ref 0.00–0.07)
Basophils Absolute: 0 10*3/uL (ref 0.0–0.1)
Basophils Relative: 0 %
Eosinophils Absolute: 0 10*3/uL (ref 0.0–0.5)
Eosinophils Relative: 0 %
HCT: 30 % — ABNORMAL LOW (ref 36.0–46.0)
Hemoglobin: 9.5 g/dL — ABNORMAL LOW (ref 12.0–15.0)
Immature Granulocytes: 1 %
Lymphocytes Relative: 6 %
Lymphs Abs: 0.7 10*3/uL (ref 0.7–4.0)
MCH: 29.1 pg (ref 26.0–34.0)
MCHC: 31.7 g/dL (ref 30.0–36.0)
MCV: 91.7 fL (ref 80.0–100.0)
Monocytes Absolute: 0.9 10*3/uL (ref 0.1–1.0)
Monocytes Relative: 8 %
Neutro Abs: 8.9 10*3/uL — ABNORMAL HIGH (ref 1.7–7.7)
Neutrophils Relative %: 85 %
Platelets: 266 10*3/uL (ref 150–400)
RBC: 3.27 MIL/uL — ABNORMAL LOW (ref 3.87–5.11)
RDW: 12.8 % (ref 11.5–15.5)
WBC: 10.5 10*3/uL (ref 4.0–10.5)
nRBC: 0 % (ref 0.0–0.2)

## 2022-02-28 LAB — COMPREHENSIVE METABOLIC PANEL
ALT: 20 U/L (ref 0–44)
AST: 25 U/L (ref 15–41)
Albumin: 2.7 g/dL — ABNORMAL LOW (ref 3.5–5.0)
Alkaline Phosphatase: 52 U/L (ref 38–126)
Anion gap: 7 (ref 5–15)
BUN: 15 mg/dL (ref 8–23)
CO2: 25 mmol/L (ref 22–32)
Calcium: 8 mg/dL — ABNORMAL LOW (ref 8.9–10.3)
Chloride: 103 mmol/L (ref 98–111)
Creatinine, Ser: 0.55 mg/dL (ref 0.44–1.00)
GFR, Estimated: >60 mL/min (ref >60)
Glucose, Bld: 184 mg/dL — ABNORMAL HIGH (ref 70–99)
Potassium: 4.4 mmol/L (ref 3.5–5.1)
Sodium: 135 mmol/L (ref 135–145)
Total Bilirubin: 0.7 mg/dL (ref 0.3–1.2)
Total Protein: 6.9 g/dL (ref 6.5–8.1)

## 2022-02-28 LAB — GLUCOSE, CAPILLARY
Glucose-Capillary: 142 mg/dL — ABNORMAL HIGH (ref 70–99)
Glucose-Capillary: 252 mg/dL — ABNORMAL HIGH (ref 70–99)
Glucose-Capillary: 316 mg/dL — ABNORMAL HIGH (ref 70–99)
Glucose-Capillary: 233 mg/dL — ABNORMAL HIGH (ref 70–99)

## 2022-02-28 LAB — MRSA NEXT GEN BY PCR, NASAL: MRSA by PCR Next Gen: NOT DETECTED

## 2022-02-28 LAB — BRAIN NATRIURETIC PEPTIDE: B Natriuretic Peptide: 101.9 pg/mL — ABNORMAL HIGH (ref 0.0–100.0)

## 2022-02-28 LAB — ECHOCARDIOGRAM COMPLETE
AR max vel: 1.99 cm2
AV Area VTI: 2.11 cm2
AV Area mean vel: 1.95 cm2
AV Mean grad: 3 mmHg
AV Peak grad: 5.4 mmHg
Ao pk vel: 1.16 m/s
Area-P 1/2: 4.49 cm2
Calc EF: 50.6 %
Height: 59.5 in
MV M vel: 5.08 m/s
MV Peak grad: 103.1 mmHg
S' Lateral: 3 cm
Single Plane A2C EF: 44.5 %
Single Plane A4C EF: 51.6 %
Weight: 1869.5 oz

## 2022-02-28 LAB — LACTATE DEHYDROGENASE: LDH: 323 U/L — ABNORMAL HIGH (ref 98–192)

## 2022-02-28 LAB — URINE CULTURE: Culture: NO GROWTH

## 2022-02-28 MED ORDER — ORAL CARE MOUTH RINSE: 15.0000 mL | OROMUCOSAL | Status: DC | PRN

## 2022-02-28 MED ORDER — FUROSEMIDE 10 MG/ML IJ SOLN
40.0000 mg | Freq: Once | INTRAMUSCULAR | Status: AC
  Administered 2022-02-28: 40 mg via INTRAVENOUS
  Filled 2022-02-28: qty 4

## 2022-02-28 MED ORDER — SORBITOL 70 % SOLN
30.0000 mL | Freq: Once | Status: AC
  Administered 2022-02-28: 30 mL via ORAL
  Filled 2022-02-28: qty 30

## 2022-02-28 MED ORDER — POLYETHYLENE GLYCOL 3350 17 G PO PACK
17.0000 g | PACK | Freq: Two times a day (BID) | ORAL | Status: DC
  Administered 2022-02-28 – 2022-03-07 (×5): 17 g via ORAL
  Filled 2022-02-28 (×11): qty 1

## 2022-02-28 MED ORDER — ORAL CARE MOUTH RINSE
15.0000 mL | OROMUCOSAL | Status: DC
  Administered 2022-02-28 – 2022-03-12 (×37): 15 mL via OROMUCOSAL

## 2022-02-28 MED ORDER — AZITHROMYCIN 250 MG PO TABS
500.0000 mg | ORAL_TABLET | Freq: Every day | ORAL | Status: DC
  Administered 2022-02-28: 500 mg via ORAL
  Filled 2022-02-28: qty 2

## 2022-02-28 NOTE — IPAL (Signed)
  Interdisciplinary Goals of Care Family Meeting   Date carried out: 02/28/2022  Location of the meeting: Bedside  Member's involved: Physician, Bedside Registered Nurse, and Family Member or next of kin  Durable Power of Attorney or acting medical decision maker: Patient    Discussion: We discussed goals of care for [employer] .  After discussion with family regarding code status she states she does not want to die on a ventilator but would be ok with a trial if indicated. She does not want CPR.  Code status: Limited Code or DNR with short term  Disposition: Continue current acute care  Time spent for the meeting: 20 min    Kelly Barcia Rodman Pickle, MD  02/28/2022, 7:09 PM

## 2022-02-28 NOTE — Progress Notes (Signed)
RT placed patient on CPAP machine last night due to history of OSA. Patient s sats were 87% at that time. Patients sats stayed in the 90's until this morning when the RN texted me stating her oxygen was 85% on the cpap. RT came and gave patient a breathing treatment and took her off cpap. While giving patient a breathing tx patient oxygen was 81%. RT placed patient on NRB and sats are now 94%. RT asked RN to please ask for a chest xray.

## 2022-02-28 NOTE — Progress Notes (Signed)
  Echocardiogram 2D Echocardiogram has been performed.  [name] 02/28/2022, 11:34 AM

## 2022-02-28 NOTE — Progress Notes (Addendum)
PCCM Progress Note  Patient on BiPAP. Rapid team contacted for concern for respiratory status and being able to monitor with current nursing ratios. Agree that patient requires closer monitoring so will transfer to SDU.  Code status was discussed and patient/husband confirmed DNR status. Does not wish to have CPR or mechanical ventilation. OK with current management with BiPAP and medications.  Transfer to SDU. Primary team updated. Pulmonary will continue to follow as consult.  Addendum:Patient on arrival to SDU has requested trial of intubation for short period (1-2 weeks with family involvement). Does not want CPR.

## 2022-02-28 NOTE — Progress Notes (Signed)
Is.  The oxygenation is a problems morning.  I spoke to the respiratory tech.  She remains similar to what was going on.  We will need to get a chest x-ray on her.  It is possible that this could be worse.  She is on IV antibiotics.  She is on steroids.  Possibility might be that the pulmonary line might be worsening.  I think that if the chest x-ray does not look any different, then she she may need a CT angiogram of the chest to see if the pulmonary emboli are progressing.  If so, she will need to be on heparin.  She is not hurting.  She is not coughing up anything.  She has had no fever.  She has had no bleeding.  Her appetite has not been great.  She has not had any nausea or vomiting.  She may have a little bit of constipation.  Labs show 162.5.  Hemoglobin 9.5.  Platelet count 266,000.  LDH is elevated 323.  Her BUN is 50 creatinine 0.5.  Blood sugar is 94.  Her vital signs show temperature 97.8.  Pulse 93.  Blood pressure 124/76.  Oxygen saturation is 81%.  Her lungs sound relatively clear bilaterally.  She seems to have good air movement bilaterally.  Cardiac exam is regular rate and rhythm.  Abdomen is soft.  Bowel sounds are okay.  She has no guarding or rebound tenderness.  Extremity shows no clubbing, cyanosis, or edema.  Neurological exam is nonfocal.  [name] has a small cell lung cancer.  She came in with pulmonary emboli and pneumonia.  +Her breathing is the issue right now.  A CXR will help.    She is a little more anemic, but does not need a transfusion.she did get IV iron.  She is on Eliquis.  We may need to consider switching to Heparin if the CXR look ok.  She may need another CT angio of the chest if the CXR is better.  I do appreciate everybody's help!!!     [name], MD  Oswaldo Milian 7:14

## 2022-02-28 NOTE — Progress Notes (Signed)
[facility] Progress Note Patient Name: [name] DOB: 07-Dec-1950 MRN: [id number]   Date of Service  02/28/2022  HPI/Events of Note  71/F with extensive small cell lung CA who had been admitted 11/23 with hypoxemia, attributed to PE, possible pneumonia, possible radiation pneumonitis. Throughout the day, pt has been having frequent episodes of desaturation, requiring NIPPV. She was subsequently transferred to the ICU for close monitoring and further management.   eICU Interventions  - Transfer to ICU - Currently SpO2 96% on NRB.  - Will continue to monitor closely, place back on CPAP/BiPAP if with desaturations. - As per notes, pt is amenable to trial of intubation, mechanical ventilation if necessary.   - Given Lasix. Monitor I/Os closely - Continue inhaled bronchodilators, inhalaed steroids - Maintain on empiric antibiotics.  - Continue anticoagulation on apixaban.          [place] 02/28/2022, 7:13 PM

## 2022-02-28 NOTE — Progress Notes (Signed)
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  Patient noted prior to admission to have some chills, body aches, fever as high as 101.  Patient noted to have sats of 89% on room air and sent to the ED from urgent care center.  COVID-19 PCR negative, influenza a and B PCR negative, respiratory viral panel negative.  CT chest done with segmental PE, concern for pneumonia.  Patient placed empirically on IV antibiotics, anticoagulation.  PCCM consulted due to concerns for radiation pneumonitis.    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  -Likely multifactorial secondary to pneumonia in a patient with immunosuppression, concern for radiation pneumonitis and also noted to have segmental PE which is unlikely source of hypoxia.  Also concern for possible volume overload as patient noted to have worsening hypoxia overnight and this morning currently on 15 L nonrebreather. -COVID-19 PCR negative, influenza A and B negative, respiratory viral panel negative.   -CT angiogram  chest with pulmonary emboli focal in the right upper lung distal segmental and subsegmental vessels, developing patchy airspace interstitial infiltrates most likely representing multifocal pneumonia, prominent mediastinal and abdominal lymph nodes likely metastatic, multiple bone mets.   -Patient with complaints of chills and fatigue.  Noted to have a Tmax of 101.6 (02/27/2022 ). -Urine strep pneumococcus antigen negative.  Urine Legionella antigen negative. -Due to Tmax of 101.6 antibiotic coverage was broadened to IV cefepime and IV azithromycin.   -IV Rocephin discontinued.   -Patient pancultured.  -Continue scheduled DuoNebs, Claritin, Dulera, Incruse, PPI -Continue anticoagulation with Eliquis. -PCCM consulted due to concerns for radiation pneumonitis and patient started on a prednisone taper. -Due to worsening hypoxia chest x-ray obtained concerning for possible volume overload and patient placed on IV Lasix. -Patient noted later on in the afternoon on 02/28/2022 to have worsening hypoxia and placed on BiPAP and subsequently transferred to the stepdown unit for closer monitoring. -PCCM following.  Subsegmental pulmonary embolism without acute cor pulmonale (HCC) Small PE. Doubt this would cause cardiac issues.  -Patient initially placed on IV heparin in the ED and has been transitioned to Eliquis.  -Hematology/oncology following.  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 Followed by oncology. Had 4 sessions of XRT in October 2023. Last session on 01-08-2022. Last received IV chemo July 2023. -Oncology following.  Type 2 diabetes mellitus without complication, with long-term current use of insulin (HCC) Stable.  -Hemoglobin A1c 11.3 (02/21/2021 ) -Repeat hemoglobin A1c 10.4 (02/26/2022). -CBG 142 this morning.  -Elevated CBG secondary to steroids. -Continue Semglee to 34  units daily, meal coverage NovoLog 4 units 3 times daily with meals.    -SSI.    Hypothyroidism - Continue home regimen Synthroid 125 mcg daily.   Anemia -Patient with no overt bleeding. -Anemia likely secondary to acute illness and radiation treatment. -Hemoglobin stable at 9.5. -Follow H&H. -Transfusion threshold hemoglobin < 7  Hypophosphatemia -Repleted.   -Phosphorus at 3.2.    Hypomagnesemia -Repleted.   Magnesium at 2.5. -Repeat labs in AM.  Hypokalemia -Repleted, potassium of 4.4.   -Magnesium at 2.5. -Repeat labs in AM.  Other constipation -Continue bowel regimen of MiraLAX twice daily, Senokot-S twice daily, Dulcolax suppository.  Hyponatremia - TSH 2.597.  -Free T4  1.28.   -Urine sodium 142, urine potassium 27,  -Concern hyponatremia may be secondary to small cell lung cancer.  -Sodium levels improved currently at 135 this morning.  -Repeat labs in the AM.          DVT prophylaxis: Eliquis Code Status: Full Family Communication: Updated patient.  No family at bedside. Disposition: Likely home when clinically improved, afebrile x 48 hours, cleared by pulmonary and oncology..  Status is: Inpatient Remains inpatient appropriate because: Severity of illness   Consultants:  PCCM: Dr. Loanne Drilling 02/26/2022 Oncology: Dr. Alvy Bimler 02/26/2022  Procedures:  Chest x-ray 02/25/2022, 02/28/2022 CT angiogram chest 02/25/2022   Antimicrobials:  IV azithromycin 02/25/2022>>>> IV Rocephin 02/25/2022>>>> 02/27/2022 IV cefepime 02/27/2022   Subjective: Patient sitting up in bed, on nonrebreather at 15 L noted to be desatted overnight and this morning.  Patient denies any chest pain.  Denies any significant shortness of breath.  No abdominal pain.  Patient complains of constipation  Objective: Vitals:   02/28/22 0953 02/28/22 1004 02/28/22 1405 02/28/22 1600  BP:   108/73   Pulse:   (!) 109   Resp:   19   Temp:   97.7 F (36.5 C)   TempSrc:   Oral   SpO2: (!) 86% 97% (!) 85% 98%  Weight:      Height:         Intake/Output Summary (Last 24 hours) at 02/28/2022 1737 Last data filed at 02/28/2022 1427 Gross per 24 hour  Intake --  Output 2000 ml  Net -2000 ml   Filed Weights   02/26/22 0500 02/27/22 0500 02/28/22 0500  Weight: 50.8 kg 52.2 kg 53 kg    Examination:  General exam: NAD. Port-A-Cath in right upper chest wall with no surrounding erythema, nontender to palpation. Respiratory system: Some diffuse crackles bilaterally.  Decreased breath sounds in the bases.  No rhonchi.  No crackles.  No wheezing.  On nonrebreather at 15 L.   Cardiovascular system: Regular rate and rhythm no murmurs rubs or gallops.  No JVD.  No lower extremity edema.  Gastrointestinal system: Abdomen is soft, nontender, nondistended, positive bowel sounds.  No rebound.  No guarding.  Central nervous system: Alert and oriented. No focal neurological deficits. Extremities: Symmetric 5 x 5 power. Skin: No rashes, lesions or ulcers Psychiatry: Judgement and insight appear normal. Mood & affect appropriate.     Data Reviewed:   CBC: Recent Labs  Lab 02/25/22 1656 02/26/22 0333 02/26/22 1155 02/27/22 1749 02/28/22 0316  WBC 6.2 6.0 6.6 11.0* 10.5  NEUTROABS 4.8  --   --  10.4* 8.9*  HGB 11.1* 9.3* 9.7* 10.3* 9.5*  HCT 34.4* 29.1* 30.0* 31.8* 30.0*  MCV 89.6 91.2 90.1 91.1 91.7  PLT 241 205 226 259 622    Basic Metabolic Panel: Recent Labs  Lab 02/25/22 1656 02/26/22 1155 02/27/22 0316 02/28/22 0316  NA 130* 134* 133* 135  K 4.1 3.4* 4.8 4.4  CL 93* 102 100 103  CO2 27 26 25 25   GLUCOSE 462* 236* 283* 184*  BUN 16 11 7* 15  CREATININE 0.75 0.54 0.52 0.55  CALCIUM 8.5* 8.3* 7.7* 8.0*  MG  --  1.5* 2.5*  --   PHOS  --  1.7* 3.2  --     GFR: Estimated Creatinine Clearance: 45.2 mL/min (by C-G formula based on SCr of 0.55 mg/dL).  Liver Function Tests: Recent Labs  Lab 02/25/22 1656 02/26/22 1155 02/27/22 0316 02/28/22 0316  AST 21  --   --  25  ALT 20  --   --  20  ALKPHOS 64   --   --  52  BILITOT 0.7  --   --  0.7  PROT 8.0  --   --  6.9  ALBUMIN 3.2* 2.9* 2.8* 2.7*    CBG: Recent Labs  Lab 02/27/22 1559 02/27/22 2055 02/28/22 0746 02/28/22 1213 02/28/22 1701  GLUCAP 250* 234* 142* 252* 316*     Recent Results (from the past 240 hour(s))  Resp Panel by RT-PCR (Flu A&B, Covid) Anterior Nasal Swab     Status: None   Collection Time: 02/25/22  4:33 PM   Specimen: Anterior Nasal Swab  Result Value Ref Range Status   SARS Coronavirus 2 by RT PCR NEGATIVE NEGATIVE Final    Comment: (NOTE) SARS-CoV-2 target nucleic acids are NOT DETECTED.  The SARS-CoV-2 RNA is generally detectable in upper respiratory specimens during the acute phase of infection. The lowest concentration of SARS-CoV-2 viral copies this assay can detect is 138 copies/mL. A negative result does not preclude SARS-Cov-2 infection and should not be used as the sole basis for treatment or other patient management decisions. A negative result may occur with  improper specimen collection/handling, submission of specimen other than nasopharyngeal swab, presence of viral mutation(s) within the areas targeted by this assay, and inadequate number of viral copies(<138 copies/mL). A negative result must be combined with clinical observations, patient history, and epidemiological information. The expected result is Negative.  Fact Sheet for Patients:  EntrepreneurPulse.com.au  Fact Sheet for Healthcare Providers:  IncredibleEmployment.be  This test is no t yet approved or cleared by the [place] FDA and  has been authorized for detection and/or diagnosis of SARS-CoV-2 by FDA under an Emergency Use Authorization (EUA). This EUA will remain  in effect (meaning this test can be used) for the duration of the COVID-19 declaration under Section 564(b)(1) of the Act, 21 U.S.C.section 360bbb-3(b)(1), unless the authorization is terminated  or revoked sooner.        Influenza A by PCR NEGATIVE NEGATIVE Final   Influenza B by PCR NEGATIVE NEGATIVE Final    Comment: (NOTE) The Xpert Xpress SARS-CoV-2/FLU/RSV plus assay is intended as an aid in the diagnosis of influenza from Nasopharyngeal swab specimens and should not be used as a sole basis for treatment. Nasal washings and aspirates are unacceptable for Xpert Xpress SARS-CoV-2/FLU/RSV testing.  Fact Sheet for Patients: EntrepreneurPulse.com.au  Fact Sheet for Healthcare Providers: IncredibleEmployment.be  This test is not yet approved or cleared by the [place] FDA and has been authorized for detection and/or diagnosis of SARS-CoV-2 by FDA under an Emergency Use Authorization (EUA). This EUA will remain in effect (meaning this test can be used) for the duration of the COVID-19 declaration under Section 564(b)(1) of the Act,  21 U.S.C. section 360bbb-3(b)(1), unless the authorization is terminated or revoked.  Performed at [facility], [address] [place], [place] [address]   Culture, blood (Routine X 2) w Reflex to ID Panel     Status: None (Preliminary result)   Collection Time: 02/25/22  4:57 PM   Specimen: BLOOD  Result Value Ref Range Status   Specimen Description   Final    BLOOD BOTTLES DRAWN AEROBIC AND ANAEROBIC LEFT ANTECUBITAL Performed at [facility], [address] [place], [place] [address]    Special Requests   Final    Blood Culture adequate volume Performed at [facility], [address] [place], [place] [address]    Culture   Final    NO GROWTH 3 DAYS Performed at [facility], [place] [address] [place], [place] [address]    Report Status PENDING  Incomplete  Respiratory (~20 pathogens) panel by PCR     Status: None   Collection Time: 02/25/22  9:09 PM   Specimen: Nasopharyngeal Swab; Respiratory  Result Value Ref Range Status   Adenovirus NOT DETECTED NOT DETECTED Final    Coronavirus 229E NOT DETECTED NOT DETECTED Final    Comment: (NOTE) The Coronavirus on the Respiratory Panel, DOES NOT test for the novel  Coronavirus (2019 nCoV)    Coronavirus HKU1 NOT DETECTED NOT DETECTED Final   Coronavirus NL63 NOT DETECTED NOT DETECTED Final   Coronavirus OC43 NOT DETECTED NOT DETECTED Final   Metapneumovirus NOT DETECTED NOT DETECTED Final   Rhinovirus / Enterovirus NOT DETECTED NOT DETECTED Final   Influenza A NOT DETECTED NOT DETECTED Final   Influenza B NOT DETECTED NOT DETECTED Final   Parainfluenza Virus 1 NOT DETECTED NOT DETECTED Final   Parainfluenza Virus 2 NOT DETECTED NOT DETECTED Final   Parainfluenza Virus 3 NOT DETECTED NOT DETECTED Final   Parainfluenza Virus 4 NOT DETECTED NOT DETECTED Final   Respiratory Syncytial Virus NOT DETECTED NOT DETECTED Final   Bordetella pertussis NOT DETECTED NOT DETECTED Final   Bordetella Parapertussis NOT DETECTED NOT DETECTED Final   Chlamydophila pneumoniae NOT DETECTED NOT DETECTED Final   Mycoplasma pneumoniae NOT DETECTED NOT DETECTED Final    Comment: Performed at [facility], [place], [place], [place] [address]  Culture, blood (Routine X 2) w Reflex to ID Panel     Status: None (Preliminary result)   Collection Time: 02/26/22 10:27 AM   Specimen: BLOOD  Result Value Ref Range Status   Specimen Description   Final    BLOOD BLOOD LEFT HAND Performed at [facility] [address] [place], [place] [address]    Special Requests   Final    BOTTLES DRAWN AEROBIC AND ANAEROBIC Blood Culture adequate volume Performed at [facility] [address] [place], [place] [address]    Culture   Final    NO GROWTH 2 DAYS Performed at [facility] [address], [place], [place] [address]    Report Status PENDING  Incomplete  Urine Culture     Status: None   Collection Time: 02/27/22  8:31 AM   Specimen: Urine, Clean Catch  Result Value Ref Range  Status   Specimen Description   Final    URINE, CLEAN CATCH Performed at [facility], [place] [address] [place], [place] [address]    Special Requests   Final    NONE Performed at  [facility], [place] [address] [place], [place] [address]    Culture   Final    NO GROWTH Performed at [facility], [place] [address], [place], [place] [address]    Report Status 02/28/2022 FINAL  Final         Radiology Studies: ECHOCARDIOGRAM COMPLETE  Result Date: 02/28/2022    ECHOCARDIOGRAM REPORT   Patient Name:   [name] Date of Exam: 02/28/2022 Medical Rec #:  [id number]     Height:       59.5 in Accession #:    [id number]    Weight:       116.8 lb Date of Birth:  Jan 05, 1951     BSA:          1.476 m Patient Age:    60 years      BP:           124/76 mmHg Patient Gender: F             HR:           95 bpm. Exam Location:  Inpatient Procedure: 2D Echo Indications:    pulmonary edema  History:        Patient has no prior history of Echocardiogram examinations.                 Risk Factors:Current Smoker, Diabetes and Dyslipidemia.  Sonographer:    Harvie Junior Referring Phys: [id number] CHI JANE ELLISON  Sonographer Comments: Technically difficult study due to poor echo windows and echo performed with patient supine and on artificial respirator. Image acquisition challenging due to respiratory motion. IMPRESSIONS  1. Left ventricular ejection fraction, by estimation, is 60 to 65%. The left ventricle has normal function. Left ventricular endocardial border not optimally defined to evaluate regional wall motion. Left ventricular diastolic parameters are consistent with Grade I diastolic dysfunction (impaired relaxation).  2. Right ventricular systolic function is normal. The right ventricular size is normal. There is normal pulmonary artery systolic pressure. The estimated right ventricular systolic pressure is 60.7 mmHg.  3. A small pericardial effusion is present. The  pericardial effusion is anterior to the right ventricle.  4. The mitral valve is grossly normal. Mild mitral valve regurgitation.  5. The aortic valve is tricuspid. Aortic valve regurgitation is not visualized. Aortic valve sclerosis is present, with no evidence of aortic valve stenosis. Aortic valve mean gradient measures 3.0 mmHg.  6. The inferior vena cava is normal in size with greater than 50% respiratory variability, suggesting right atrial pressure of 3 mmHg. Comparison(s): No prior Echocardiogram. FINDINGS  Left Ventricle: Left ventricular ejection fraction, by estimation, is 60 to 65%. The left ventricle has normal function. Left ventricular endocardial border not optimally defined to evaluate regional wall motion. The left ventricular internal cavity size was normal in size. There is no left ventricular hypertrophy. Left ventricular diastolic parameters are consistent with Grade I diastolic dysfunction (impaired relaxation). Right Ventricle: The right ventricular size is normal. No increase in right ventricular wall thickness. Right ventricular systolic function is normal. There is normal pulmonary artery systolic pressure. The tricuspid regurgitant velocity is 2.48 m/s, and  with an assumed right atrial pressure of 3 mmHg, the estimated right ventricular systolic pressure is 37.1 mmHg. Left Atrium: Left atrial size was normal in size. Right Atrium: Right atrial size was normal in size. Pericardium: A small pericardial effusion is present. The pericardial effusion is anterior to the right ventricle. Mitral Valve: The mitral valve  is grossly normal. Mild mitral valve regurgitation. Tricuspid Valve: The tricuspid valve is grossly normal. Tricuspid valve regurgitation is trivial. Aortic Valve: The aortic valve is tricuspid. Aortic valve regurgitation is not visualized. Aortic valve sclerosis is present, with no evidence of aortic valve stenosis. Aortic valve mean gradient measures 3.0 mmHg. Aortic valve peak  gradient measures 5.4  mmHg. Aortic valve area, by VTI measures 2.11 cm. Pulmonic Valve: The pulmonic valve was not well visualized. Pulmonic valve regurgitation is not visualized. Aorta: The aortic root is normal in size and structure. Venous: The inferior vena cava is normal in size with greater than 50% respiratory variability, suggesting right atrial pressure of 3 mmHg. IAS/Shunts: No atrial level shunt detected by color flow Doppler.  LEFT VENTRICLE PLAX 2D LVIDd:         3.80 cm     Diastology LVIDs:         3.00 cm     LV e' medial:    4.68 cm/s LV PW:         0.90 cm     LV E/e' medial:  18.1 LV IVS:        1.00 cm     LV e' lateral:   4.68 cm/s LVOT diam:     1.80 cm     LV E/e' lateral: 18.1 LV SV:         43 LV SV Index:   29 LVOT Area:     2.54 cm  LV Volumes (MOD) LV vol d, MOD A2C: 36.6 ml LV vol d, MOD A4C: 58.7 ml LV vol s, MOD A2C: 20.3 ml LV vol s, MOD A4C: 28.4 ml LV SV MOD A2C:     16.3 ml LV SV MOD A4C:     58.7 ml LV SV MOD BP:      24.7 ml RIGHT VENTRICLE RV Basal diam:  3.00 cm RV Mid diam:    2.30 cm TAPSE (M-mode): 1.3 cm LEFT ATRIUM             Index        RIGHT ATRIUM          Index LA diam:        3.00 cm 2.03 cm/m   RA Area:     7.13 cm LA Vol (A2C):   19.6 ml 13.28 ml/m  RA Volume:   11.20 ml 7.59 ml/m LA Vol (A4C):   14.1 ml 9.55 ml/m LA Biplane Vol: 17.9 ml 12.13 ml/m  AORTIC VALVE                    PULMONIC VALVE AV Area (Vmax):    1.99 cm     PV Vmax:       0.99 m/s AV Area (Vmean):   1.95 cm     PV Peak grad:  3.9 mmHg AV Area (VTI):     2.11 cm AV Vmax:           116.00 cm/s AV Vmean:          80.200 cm/s AV VTI:            0.205 m AV Peak Grad:      5.4 mmHg AV Mean Grad:      3.0 mmHg LVOT Vmax:         90.70 cm/s LVOT Vmean:        61.400 cm/s LVOT VTI:          0.170 m LVOT/AV VTI  ratio: 0.83  AORTA Ao Root diam: 2.80 cm MITRAL VALVE                TRICUSPID VALVE MV Area (PHT): 4.49 cm     TR Peak grad:   24.6 mmHg MV Decel Time: 169 msec     TR Vmax:         248.00 cm/s MR Peak grad: 103.1 mmHg MR Vmax:      507.80 cm/s   SHUNTS MV E velocity: 84.80 cm/s   Systemic VTI:  0.17 m MV A velocity: 115.00 cm/s  Systemic Diam: 1.80 cm MV E/A ratio:  0.74 Rozann Lesches MD Electronically signed by Rozann Lesches MD Signature Date/Time: 02/28/2022/1:48:44 PM    Final    DG Chest 2 View  Result Date: 02/28/2022 CLINICAL DATA:  Shortness of breath. EXAM: CHEST - 2 VIEW COMPARISON:  February 25, 2022. FINDINGS: Stable cardiomediastinal silhouette. Right internal jugular Port-A-Cath is unchanged in position. Mildly increased bilateral diffuse reticular opacities are noted concerning for worsening edema or atypical infection. Bony thorax is unremarkable. IMPRESSION: Increased bilateral lung opacities as described above. Electronically Signed   By: Marijo Conception M.D.   On: 02/28/2022 08:57        Scheduled Meds:  apixaban  10 mg Oral BID   Followed by   Derrill Memo ON 03/05/2022] apixaban  5 mg Oral BID   azithromycin  500 mg Oral QHS   bisacodyl  10 mg Rectal Daily   Chlorhexidine Gluconate Cloth  6 each Topical Daily   FLUoxetine  20 mg Oral Daily   fluticasone  2 spray Each Nare Daily   insulin aspart  0-15 Units Subcutaneous TID WC   insulin aspart  0-5 Units Subcutaneous QHS   insulin aspart  4 Units Subcutaneous TID WC   insulin glargine-yfgn  34 Units Subcutaneous QHS   ipratropium-albuterol  3 mL Nebulization TID   levothyroxine  125 mcg Oral Q0600   loratadine  10 mg Oral Daily   mometasone-formoterol  2 puff Inhalation BID   oxyCODONE  10 mg Oral Q12H   pantoprazole  40 mg Oral Daily   polyethylene glycol  17 g Oral BID   predniSONE  40 mg Oral Q breakfast   Followed by   Derrill Memo ON 03/02/2022] predniSONE  30 mg Oral Q breakfast   Followed by   Derrill Memo ON 03/05/2022] predniSONE  20 mg Oral Q breakfast   Followed by   Derrill Memo ON 03/08/2022] predniSONE  10 mg Oral Q breakfast   senna-docusate  2 tablet Oral BID   Continuous Infusions:   ceFEPime (MAXIPIME) IV 2 g (02/28/22 1140)     LOS: 2 days    Time spent: 35 minutes    Irine Seal, MD [facility]   To contact the attending provider between 7A-7P or the covering provider during after hours 7P-7A, please log into the web site [email or web address] and access using universal [place] password for that web site. If you do not have the password, please call the hospital operator.  02/28/2022, 5:37 PM

## 2022-02-28 NOTE — Progress Notes (Addendum)
NAME:  [name], MRN:  [id number], DOB:  [birth year], LOS: 2 ADMISSION DATE:  02/25/2022, CONSULTATION DATE:  02/26/22 REFERRING MD:  Irine Seal, MD CHIEF COMPLAINT:  Acute hypoxemic respiratory failure   History of Present Illness:  71 year old female former smoker (50 pack-years, quit 2018) who presented to the ED and found with hypoxemia. PTA had 1 week URI associated with myalgias , chills, fever and urinary frequency and diarrhea. COVID neg at home. She continued to feel poorly and presented to urgent care with SpO2 89% so was advised to present to the ED. CTA demonstrated small segmental and segmental PE in RUL and patchy infiltrates bilaterally concerning for pneumonia. RVP and COVID/influenza neg. Started on supplemental oxygen, ceftriaxone and azithromycin and heparin. PCCM consulted.  She reports she is active at baseline with no limitations in activity. Followed by Dr. Lamonte Sakai for her diagnosis of lung cancer but did not need any bronchodilators. Last radiation treatment was in October 2023. No immediate issues post-procedure. URI symptoms one week ago as noted above. No wheezing or productive cough.  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 Hiatal hernia. Unchanged lymphadenopathy. Emphysema. Patchy GGO bilaterally. Prior bone mets seen.  Interim History / Subjective:  Overnight patient was noted to have desaturations to 80s while on CPAP. Given breathing treatment however continued to be hypoxemic. Placed on NRB. CXR demonstrated bilateral interstitial opacities concerning for  edema  Objective   Blood pressure 124/76, pulse 95, temperature 97.8 F (36.6 C), resp. rate 17, height 4' 11.5" (1.511 m), weight 53 kg, SpO2 (!) 81 %.        Intake/Output Summary (Last 24 hours) at 02/28/2022 0930 Last data filed at 02/28/2022 0535 Gross per 24 hour  Intake --  Output 401 ml  Net -401 ml    Filed Weights   02/26/22 0500 02/27/22 0500 02/28/22 0500  Weight: 50.8 kg 52.2 kg 53 kg     Physical Exam: General: Elderly, well-appearing, mild respiratory distress, able to speak full sentences HENT: [place], AT, OP clear, MMM, NRB Eyes: EOMI, no scleral icterus Respiratory: Bilateral crackle R>L Cardiovascular: RRR, -M/R/G, no JVD GI: BS+, soft, nontender Extremities:-Edema,-tenderness Neuro: AAO x4, CNII-XII grossly intact  Overall normal BMET with normal Cr BNP 101 Chronic anemia 9-11 CXR demonstrated bilateral interstitial opacities concerning for edema  Resolved Hospital Problem list   N/A  Assessment & Plan:   Acute hypoxemic respiratory failure 2/2 multifocal pneumonia +/- subacute radiation pneumonitis, now acute pulmonary edema Emphysema, minimal/mild - no bronchodilators or O2 at baseline, current on scheduled nebs Subsegmental PE - on Eliquis. Heme/Onc following Hx metastatic small cell carcinoma of lung s/p chemoradiation - Last XRT 01/2022, last chemo 10/2021  History and CT imaging reviewed. Respiratory failure in this clinic scenario likely driven by acute infectious process. Cannot rule out radiation pneumonitis though less convincing with bilateral lung L>R involvement when radiation was directed only at left lung. Still reasonable to start treatment with prednisone for potential pneumonitis with plan to closely follow-up in Pulmonary clinic with taper ~12 days starting at 40 mg. If symptoms worsen would recommend restarting prednisone taper at 40 mg x 2  weeks, followed by taper over 3 weeks. Will need to monitor based on clinically symptoms if  slower taper indicated. Please ambulate prior to discharge to determine home O2 needs. Continue antibiotics for CAP coverage for 5-7 days.  This am/overnight worsening hypoxemia due to suspected pulmonary edema.   --Lasix 40mg  IV given. If not responsive to diuretics consider CT Chest tomorrow --Transition to HFNC when able for SpO2 goal >88%  Pulmonary will continue to follow Has Pulmonary outpatient visit scheduled on 03/10/22      Critical care time: N/A    Care Time: 50 min  Rodman Pickle, M.D. [facility] Pulmonary/Critical Care Medicine 02/28/2022 9:30 AM   See Amion for personal pager For hours between 7 PM to 7 AM, please call Elink for urgent questions

## 2022-03-01 ENCOUNTER — Inpatient Hospital Stay (HOSPITAL_COMMUNITY): Payer: PPO

## 2022-03-01 DIAGNOSIS — J9601 Acute respiratory failure with hypoxia: Secondary | ICD-10-CM | POA: Diagnosis not present

## 2022-03-01 DIAGNOSIS — J189 Pneumonia, unspecified organism: Secondary | ICD-10-CM | POA: Diagnosis not present

## 2022-03-01 DIAGNOSIS — I2693 Single subsegmental pulmonary embolism without acute cor pulmonale: Secondary | ICD-10-CM | POA: Diagnosis not present

## 2022-03-01 DIAGNOSIS — K5909 Other constipation: Secondary | ICD-10-CM | POA: Diagnosis not present

## 2022-03-01 LAB — CBC WITH DIFFERENTIAL/PLATELET
Abs Immature Granulocytes: 0.1 10*3/uL — ABNORMAL HIGH (ref 0.00–0.07)
Basophils Absolute: 0 10*3/uL (ref 0.0–0.1)
Basophils Relative: 0 %
Eosinophils Absolute: 0 10*3/uL (ref 0.0–0.5)
Eosinophils Relative: 0 %
HCT: 30.4 % — ABNORMAL LOW (ref 36.0–46.0)
Hemoglobin: 9.6 g/dL — ABNORMAL LOW (ref 12.0–15.0)
Immature Granulocytes: 1 %
Lymphocytes Relative: 5 %
Lymphs Abs: 0.7 10*3/uL (ref 0.7–4.0)
MCH: 29.1 pg (ref 26.0–34.0)
MCHC: 31.6 g/dL (ref 30.0–36.0)
MCV: 92.1 fL (ref 80.0–100.0)
Monocytes Absolute: 1 10*3/uL (ref 0.1–1.0)
Monocytes Relative: 8 %
Neutro Abs: 11.6 10*3/uL — ABNORMAL HIGH (ref 1.7–7.7)
Neutrophils Relative %: 86 %
Platelets: 288 10*3/uL (ref 150–400)
RBC: 3.3 MIL/uL — ABNORMAL LOW (ref 3.87–5.11)
RDW: 13.2 % (ref 11.5–15.5)
WBC: 13.5 10*3/uL — ABNORMAL HIGH (ref 4.0–10.5)
nRBC: 0 % (ref 0.0–0.2)

## 2022-03-01 LAB — GLUCOSE, CAPILLARY
Glucose-Capillary: 170 mg/dL — ABNORMAL HIGH (ref 70–99)
Glucose-Capillary: 134 mg/dL — ABNORMAL HIGH (ref 70–99)
Glucose-Capillary: 280 mg/dL — ABNORMAL HIGH (ref 70–99)
Glucose-Capillary: 186 mg/dL — ABNORMAL HIGH (ref 70–99)

## 2022-03-01 LAB — COMPREHENSIVE METABOLIC PANEL
ALT: 39 U/L (ref 0–44)
AST: 48 U/L — ABNORMAL HIGH (ref 15–41)
Albumin: 2.7 g/dL — ABNORMAL LOW (ref 3.5–5.0)
Alkaline Phosphatase: 59 U/L (ref 38–126)
Anion gap: 9 (ref 5–15)
BUN: 26 mg/dL — ABNORMAL HIGH (ref 8–23)
CO2: 28 mmol/L (ref 22–32)
Calcium: 8 mg/dL — ABNORMAL LOW (ref 8.9–10.3)
Chloride: 101 mmol/L (ref 98–111)
Creatinine, Ser: 0.62 mg/dL (ref 0.44–1.00)
GFR, Estimated: >60 mL/min (ref >60)
Glucose, Bld: 185 mg/dL — ABNORMAL HIGH (ref 70–99)
Potassium: 4.3 mmol/L (ref 3.5–5.1)
Sodium: 138 mmol/L (ref 135–145)
Total Bilirubin: 0.6 mg/dL (ref 0.3–1.2)
Total Protein: 7.4 g/dL (ref 6.5–8.1)

## 2022-03-01 LAB — MAGNESIUM: Magnesium: 2.1 mg/dL (ref 1.7–2.4)

## 2022-03-01 LAB — BRAIN NATRIURETIC PEPTIDE: B Natriuretic Peptide: 35.1 pg/mL (ref 0.0–100.0)

## 2022-03-01 LAB — LACTATE DEHYDROGENASE: LDH: 404 U/L — ABNORMAL HIGH (ref 98–192)

## 2022-03-01 MED ORDER — METHYLPREDNISOLONE SODIUM SUCC 125 MG IJ SOLR
60.0000 mg | Freq: Every day | INTRAMUSCULAR | Status: DC
  Administered 2022-03-01: 60 mg via INTRAVENOUS
  Filled 2022-03-01: qty 2

## 2022-03-01 MED ORDER — MORPHINE SULFATE (PF) 2 MG/ML IV SOLN
1.0000 mg | INTRAVENOUS | Status: DC | PRN
  Administered 2022-03-01 – 2022-03-02 (×3): 2 mg via INTRAVENOUS
  Filled 2022-03-01 (×3): qty 1

## 2022-03-01 MED ORDER — FUROSEMIDE 10 MG/ML IJ SOLN
40.0000 mg | Freq: Every day | INTRAMUSCULAR | Status: DC
  Administered 2022-03-01: 40 mg via INTRAVENOUS
  Filled 2022-03-01: qty 4

## 2022-03-01 MED ORDER — SODIUM CHLORIDE 0.9 % IV SOLN
500.0000 mg | INTRAVENOUS | Status: AC
  Administered 2022-03-01 – 2022-03-02 (×2): 500 mg via INTRAVENOUS
  Filled 2022-03-01 (×2): qty 5

## 2022-03-01 MED ORDER — METHYLPREDNISOLONE SODIUM SUCC 40 MG IJ SOLR: 40.0000 mg | Freq: Every day | INTRAMUSCULAR | Status: DC

## 2022-03-01 MED ORDER — FUROSEMIDE 10 MG/ML IJ SOLN
40.0000 mg | Freq: Once | INTRAMUSCULAR | Status: AC
  Administered 2022-03-01: 40 mg via INTRAVENOUS
  Filled 2022-03-01: qty 4

## 2022-03-01 MED ORDER — BUDESONIDE 0.25 MG/2ML IN SUSP
0.2500 mg | Freq: Two times a day (BID) | RESPIRATORY_TRACT | Status: DC
  Administered 2022-03-01 – 2022-03-10 (×19): 0.25 mg via RESPIRATORY_TRACT
  Filled 2022-03-01 (×19): qty 2

## 2022-03-01 NOTE — Progress Notes (Addendum)
NAME:  [name], MRN:  [id number], DOB:  21-Sep-1950, LOS: 3 ADMISSION DATE:  02/25/2022, CONSULTATION DATE:  02/26/22 REFERRING MD:  Irine Seal, MD CHIEF COMPLAINT:  Acute hypoxemic respiratory failure   History of Present Illness:  71 year old female former smoker (50 pack-years, quit 2018) who presented to the ED and found with hypoxemia. PTA had 1 week URI associated with myalgias , chills, fever and urinary frequency and diarrhea. COVID neg at home. She continued to feel poorly and presented to urgent care with SpO2 89% so was advised to present to the ED. CTA demonstrated small segmental and segmental PE in RUL and patchy infiltrates bilaterally concerning for pneumonia. RVP and COVID/influenza neg. Started on supplemental oxygen, ceftriaxone and azithromycin and heparin. PCCM consulted.  She reports she is active at baseline with no limitations in activity. Followed by Dr. Lamonte Sakai for her diagnosis of lung cancer but did not need any bronchodilators. Last radiation treatment was in October 2023. No immediate issues post-procedure. URI symptoms one week ago as noted above. No wheezing or productive cough.  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 Hiatal hernia. Unchanged lymphadenopathy. Emphysema. Patchy GGO bilaterally. Prior bone mets seen. 11/25 Transferred to SDU for BiPAP for pulmonary edema  Interim History / Subjective:  Diuresed well yesterday. Improved pulmonary edema on CXR  Objective   Blood pressure (!) 102/58, pulse 87, temperature (!) 97 F (36.1 C), temperature source Axillary,  resp. rate (!) 29, height 4' 11.5" (1.511 m), weight 53.2 kg, SpO2 94 %.    FiO2 (%):  [60 %] 60 %   Intake/Output Summary (Last 24 hours) at 03/01/2022 0956 Last data filed at 02/28/2022 2321 Gross per 24 hour  Intake 398.38 ml  Output 2025 ml  Net -1626.62 ml   Filed Weights   02/28/22 0500 02/28/22 1906 03/01/22 0500  Weight: 53 kg 52.2 kg 53.2 kg   Physical Exam: General: Elderly, chronically ill-appearing, no acute distress HENT: [place], AT, OP clear, MMM, BiPAP in place Eyes: EOMI, no scleral icterus Respiratory: Improved bibasilar crackles, no wheezing Cardiovascular: RRR, -M/R/G, no JVD GI: BS+, soft, nontender Extremities:-Edema,-tenderness Neuro: AAO x4, CNII-XII grossly intact GU: External foley in place  BMET overall stable BNP 101>35 Chronic anemia 9-11 CXR 11/26 resolving pulmonary edema TTE 11/25 Normal EF, grade I DD, small anterior pericardial effusion  Resolved Hospital Problem list   N/A  Assessment & Plan:   Acute hypoxemic respiratory failure 2/2 multifocal pneumonia +/- subacute radiation pneumonitis, now acute pulmonary edema Emphysema, minimal/mild - no bronchodilators or O2 at baseline, current on scheduled nebs Subsegmental PE - on Eliquis. Heme/Onc following Hx metastatic small cell carcinoma of lung s/p chemoradiation - Last XRT 01/2022, last chemo 10/2021  History and CT imaging reviewed. Respiratory failure in this clinic scenario likely driven by acute infectious process. Cannot rule out radiation pneumonitis though less convincing with bilateral lung L>R involvement when radiation was directed only at left lung. Still reasonable to start treatment with prednisone for potential pneumonitis with plan to closely follow-up in Pulmonary clinic with taper ~12 days starting at 40 mg. If symptoms worsen would recommend restarting prednisone taper at  40 mg x 2 weeks, followed by taper over 3 weeks. Will need to monitor based on clinically symptoms if slower  taper indicated. Please ambulate prior to discharge to determine home O2 needs. Continue antibiotics for CAP coverage for 5-7 days.  Transferred to SDU last night due to BiPAP needs secondary to pulmonary edema Improved CXR after diuresis  --Continue diuresis --Increased solumedrol to 60 mg daily --Transition from ICS/LABA to pulmicort nebs --Transition from BiPAP to HFNC when able for SpO2 goal >88% --CT Chest without contrast today or tomorrow if O2 requirement remains high    Pulmonary will continue to follow Has Pulmonary outpatient visit scheduled on 03/10/22     Best Practice (right click and "Reselect all SmartList Selections" daily)   Diet/type: NPO DVT prophylaxis: DOAC GI prophylaxis: PPI Lines: N/A Foley:  N/A Code Status:  limited. OK for intubation. No chest compressions Last date of multidisciplinary goals of care discussion [ 11/25] Confirmed by patient and family  Critical care time: N/A    Care Time: 18 min  Rodman Pickle, M.D. [facility] Pulmonary/Critical Care Medicine 03/01/2022 10:07 AM   See Amion for personal pager For hours between 7 PM to 7 AM, please call Elink for urgent questions

## 2022-03-01 NOTE — IPAL (Signed)
  Interdisciplinary Goals of Care Family Meeting   Date carried out: 03/01/2022  Location of the meeting: Bedside  Member's involved: Physician and Family Member or next of kin  Durable Power of Attorney or Loss adjuster, chartered: Patient, husband, daughter    Discussion: We discussed goals of care for [employer] .  Discussed plan for increased O2 needs including heated high flow and BiPAP. We once again addressed mechanical ventilation and patient wishes to return to her original decision of being full DNR/DNR. She does not want to be put on a ventilator but wants to continue current measures.  Code status: Full DNR  Disposition: Continue current acute care  Time spent for the meeting: 30 min    Dalayah Deahl Rodman Pickle, MD  03/01/2022, 3:02 PM

## 2022-03-01 NOTE — Progress Notes (Signed)
Bipap standby pt like to stay on [place]. Pt is tolerating it well at this time.

## 2022-03-01 NOTE — Plan of Care (Signed)
  Problem: Education: Goal: Knowledge of General Education information will improve Description: Including pain rating scale, medication(s)/side effects and non-pharmacologic comfort measures Outcome: Progressing   Problem: Health Behavior/Discharge Planning: Goal: Ability to manage health-related needs will improve Outcome: Progressing   Problem: Clinical Measurements: Goal: Ability to maintain clinical measurements within normal limits will improve Outcome: Progressing Goal: Cardiovascular complication will be avoided Outcome: Progressing   Problem: Coping: Goal: Level of anxiety will decrease Outcome: Progressing   Problem: Elimination: Goal: Will not experience complications related to bowel motility Outcome: Progressing Goal: Will not experience complications related to urinary retention Outcome: Progressing   Problem: Pain Managment: Goal: General experience of comfort will improve Outcome: Progressing   Problem: Safety: Goal: Ability to remain free from injury will improve Outcome: Progressing

## 2022-03-01 NOTE — Progress Notes (Signed)
RT placed patient on heated high flow at 35L /60%, The patient did not tolerated, saturations decreased to 84%. Patient stated she was really sleepy this morning and hasn't slept in 4 days. Patient was placed back on BIPAP post MDI and breathing treatment. Patient appears to be resting well at this time. RT will continue to monitor

## 2022-03-01 NOTE — Progress Notes (Signed)
PCCM Progress Note  Discussed with patient regarding diet. She is wanting something to eat. We discussed the risk of aspiration and her decreased respiratory reserve. Patient would still like to try something. Will start clear liquid diet with the understanding that she is high aspiration risk.

## 2022-03-01 NOTE — Progress Notes (Signed)
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  Patient noted prior to admission to have some chills, body aches, fever as high as 101.  Patient noted to have sats of 89% on room air and sent to the ED from urgent care center.  COVID-19 PCR negative, influenza a and B PCR negative, respiratory viral panel negative.  CT chest done with segmental PE, concern for pneumonia.  Patient placed empirically on IV antibiotics, anticoagulation.  PCCM consulted due to concerns for radiation pneumonitis.    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   -Likely multifactorial secondary to pneumonia in a patient with immunosuppression, concern for radiation pneumonitis and also noted to have segmental PE which is unlikely source of hypoxia.   Also concern for possible volume overload as patient noted to have worsening hypoxia on 02/28/2022 required BiPAP and subsequently transferred to the stepdown unit.   -Still with significant O2 requirements.  On BiPAP.  -COVID-19 PCR negative,  influenza A and B negative, respiratory viral panel negative.   -CT angiogram chest with pulmonary emboli focal in the right upper lung distal segmental and subsegmental vessels, developing patchy airspace interstitial infiltrates most likely representing multifocal pneumonia, prominent mediastinal and abdominal lymph nodes likely metastatic, multiple bone mets.   -Patient with complaints of chills and fatigue.   -Noted to have a Tmax of 101.6 (02/27/2022 ). -Urine strep pneumococcus antigen negative.  Urine Legionella antigen negative. -Due to Tmax of 101.6 antibiotic coverage was broadened to IV cefepime and IV azithromycin.   -IV Rocephin discontinued.   -Patient pancultured.  -Continue scheduled DuoNebs, Claritin, Dulera, Incruse, PPI -Continue anticoagulation with Eliquis. -PCCM consulted due to concerns for radiation pneumonitis and patient started on a prednisone taper. -Due to worsening hypoxia chest x-ray obtained concerning for possible volume overload and patient placed on IV Lasix. -Chest x-ray with slight improvement however due to ongoing hypoxia although prednisone changed to IV Solu-Medrol and dose increased per PCCM to 60 mg daily. -Patient has been changed from ICS/LABA to Pulmicort nebs, continue diuresis per PCCM and no significant improvement in O2 requirements may need repeat CT chest. -PCCM following.  Subsegmental pulmonary embolism without acute cor pulmonale (HCC) Small PE. Doubt this would cause cardiac issues.  -Patient initially placed on IV heparin in the ED and has been transitioned to Eliquis.  -Patient on BiPAP and unable to tolerate oral intake and as such did not receive Eliquis today and as such we will transition to IV heparin. -Hematology/oncology following.  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 Followed by oncology. Had 4 sessions of XRT in October 2023. Last session on 01-08-2022. Last received IV chemo  July 2023. -Oncology following.  Type 2 diabetes mellitus without complication, with long-term current use of insulin (HCC) Stable.  -Hemoglobin A1c 11.3 (02/21/2021 ) -Repeat hemoglobin A1c 10.4 (02/26/2022). -CBG 170 this morning.  -Elevated CBG secondary to steroids. -Continue Semglee to 34 units daily, meal coverage NovoLog 4 units 3 times daily with meals.   -SSI.    Hypothyroidism - Continue home regimen Synthroid 125 mcg daily.  -If patient continues to require BiPAP may need to change Synthroid to IV daily.  Anemia -Patient with no overt bleeding. -Anemia likely secondary to acute illness and radiation treatment. -Hemoglobin stable at 9.6. -Follow H&H. -Transfusion threshold hemoglobin < 7  Hypophosphatemia -Repleted.   -Phosphorus at 3.2.    Hypomagnesemia -Repleted.   Magnesium at 2.1. -Repeat labs in AM.  Hypokalemia -Repleted, potassium of 4.3.   -Magnesium at 2.1. -Repeat labs in AM.  Other constipation -Continue bowel regimen of MiraLAX twice daily, Senokot-S twice daily, Dulcolax suppository.  Hyponatremia - TSH 2.597.  -Free T4  1.28.   -Urine sodium 142, urine potassium 27,  -Concern hyponatremia may be secondary to small cell lung cancer.  -Sodium levels improved currently at 138 this morning.  -Repeat labs in the AM.          DVT prophylaxis: Eliquis>>> heparin Code Status: Full Family Communication: Updated patient.  No family at bedside. Disposition: TBD.  Currently in stepdown unit on BiPAP.   Status is: Inpatient Remains inpatient appropriate because: Severity of illness   Consultants:  PCCM: Dr. Loanne Drilling 02/26/2022 Oncology: Dr. Alvy Bimler 02/26/2022  Procedures:  Chest x-ray 02/25/2022, 02/28/2022, 03/01/2022 CT angiogram chest 02/25/2022   Antimicrobials:  IV azithromycin 02/25/2022>>>> IV Rocephin 02/25/2022>>>> 02/27/2022 IV cefepime 02/27/2022   Subjective: Patient in stepdown unit, on BiPAP.  Noted to have  desatted in the 70s on heated high flow nonrebreather per RN and placed back on the BiPAP this morning.  Patient with some complaints of shortness of breath.  No chest pain.  No abdominal pain.    Objective: Vitals:   03/01/22 0803 03/01/22 0841 03/01/22 1203 03/01/22 1409  BP:      Pulse: 89   90  Resp: (!) 26   (!) 28  Temp:   (!) 96.2 F (35.7 C)   TempSrc:   Axillary   SpO2: 93% 94%  97%  Weight:      Height:        Intake/Output Summary (Last 24 hours) at 03/01/2022 1453 Last data filed at 03/01/2022 1000 Gross per 24 hour  Intake 498.38 ml  Output 425 ml  Net 73.38 ml   Filed Weights   02/28/22 0500 02/28/22 1906 03/01/22 0500  Weight: 53 kg 52.2 kg 53.2 kg    Examination:  General exam: NAD. Port-A-Cath in right upper chest wall with no surrounding erythema, nontender to palpation. Respiratory system: Some diffuse crackles bilaterally.  No rhonchi, no wheezing.  On BiPAP.  Cardiovascular system: RRR no murmurs rubs or gallops.  No JVD.  No lower extremity edema.  Gastrointestinal system: Abdomen is soft, nontender, nondistended, positive bowel sounds.  No rebound.  No guarding.  Central nervous system: Alert and oriented. No focal neurological deficits. Extremities: Symmetric 5 x 5 power. Skin: No rashes, lesions or ulcers Psychiatry: Judgement and insight appear normal. Mood & affect appropriate.     Data Reviewed:   CBC: Recent Labs  Lab 02/25/22 1656 02/26/22 0333 02/26/22 1155 02/27/22 1749 02/28/22 0316 03/01/22 0500  WBC 6.2 6.0 6.6 11.0* 10.5 13.5*  NEUTROABS 4.8  --   --  10.4* 8.9* 11.6*  HGB 11.1* 9.3* 9.7* 10.3* 9.5* 9.6*  HCT 34.4* 29.1* 30.0* 31.8* 30.0* 30.4*  MCV 89.6 91.2 90.1 91.1 91.7 92.1  PLT 241 205 226 259 266 299    Basic Metabolic Panel: Recent Labs  Lab 02/25/22 1656 02/26/22 1155 02/27/22 0316 02/28/22 0316 03/01/22 0500  NA 130* 134* 133* 135 138  K 4.1 3.4* 4.8 4.4 4.3  CL 93* 102 100 103 101  CO2 27 26 25 25 28    GLUCOSE 462* 236* 283* 184* 185*  BUN 16 11 7* 15 26*  CREATININE 0.75 0.54 0.52 0.55 0.62  CALCIUM 8.5* 8.3* 7.7* 8.0* 8.0*  MG  --  1.5* 2.5*  --  2.1  PHOS  --  1.7* 3.2  --   --     GFR: Estimated Creatinine Clearance: 45.2 mL/min (by C-G formula based on SCr of 0.62 mg/dL).  Liver Function Tests: Recent Labs  Lab 02/25/22 1656 02/26/22 1155 02/27/22 0316 02/28/22 0316 03/01/22 0500  AST 21  --   --  25 48*  ALT 20  --   --  20 39  ALKPHOS 64  --   --  52 59  BILITOT 0.7  --   --  0.7 0.6  PROT 8.0  --   --  6.9 7.4  ALBUMIN 3.2* 2.9* 2.8* 2.7* 2.7*    CBG: Recent Labs  Lab 02/28/22 1213 02/28/22 1701 02/28/22 2211 03/01/22 0803 03/01/22 1140  GLUCAP 252* 316* 233* 170* 134*     Recent Results (from the past 240 hour(s))  Resp Panel by RT-PCR (Flu A&B, Covid) Anterior Nasal Swab     Status: None   Collection Time: 02/25/22  4:33 PM   Specimen: Anterior Nasal Swab  Result Value Ref Range Status   SARS Coronavirus 2 by RT PCR NEGATIVE NEGATIVE Final    Comment: (NOTE) SARS-CoV-2 target nucleic acids are NOT DETECTED.  The SARS-CoV-2 RNA is generally detectable in upper respiratory specimens during the acute phase of infection. The lowest concentration of SARS-CoV-2 viral copies this assay can detect is 138 copies/mL. A negative result does not preclude SARS-Cov-2 infection and should not be used as the sole basis for treatment or other patient management decisions. A negative result may occur with  improper specimen collection/handling, submission of specimen other than nasopharyngeal swab, presence of viral mutation(s) within the areas targeted by this assay, and inadequate number of viral copies(<138 copies/mL). A negative result must be combined with clinical observations, patient history, and epidemiological information. The expected result is Negative.  Fact Sheet for Patients:  EntrepreneurPulse.com.au  Fact Sheet for  Healthcare Providers:  IncredibleEmployment.be  This test is no t yet approved or cleared by the [place] FDA and  has been authorized for detection and/or diagnosis of SARS-CoV-2 by FDA under an Emergency Use Authorization (EUA). This EUA will remain  in effect (meaning this test can be used) for the duration of the COVID-19 declaration under Section 564(b)(1) of the Act, 21 U.S.C.section 360bbb-3(b)(1), unless the authorization is terminated  or revoked sooner.       Influenza A by PCR NEGATIVE NEGATIVE Final   Influenza B by PCR NEGATIVE NEGATIVE Final    Comment: (NOTE) The Xpert Xpress SARS-CoV-2/FLU/RSV plus assay is intended as an aid in the diagnosis of influenza  from Nasopharyngeal swab specimens and should not be used as a sole basis for treatment. Nasal washings and aspirates are unacceptable for Xpert Xpress SARS-CoV-2/FLU/RSV testing.  Fact Sheet for Patients: EntrepreneurPulse.com.au  Fact Sheet for Healthcare Providers: IncredibleEmployment.be  This test is not yet approved or cleared by the [place] FDA and has been authorized for detection and/or diagnosis of SARS-CoV-2 by FDA under an Emergency Use Authorization (EUA). This EUA will remain in effect (meaning this test can be used) for the duration of the COVID-19 declaration under Section 564(b)(1) of the Act, 21 U.S.C. section 360bbb-3(b)(1), unless the authorization is terminated or revoked.  Performed at [facility], [address] [place], [place] [address]   Culture, blood (Routine X 2) w Reflex to ID Panel     Status: None (Preliminary result)   Collection Time: 02/25/22  4:57 PM   Specimen: BLOOD  Result Value Ref Range Status   Specimen Description   Final    BLOOD BOTTLES DRAWN AEROBIC AND ANAEROBIC LEFT ANTECUBITAL Performed at [facility], [address] [place], [place] [address]    Special Requests    Final    Blood Culture adequate volume Performed at [facility], [address] [place], [place] [address]    Culture   Final    NO GROWTH 4 DAYS Performed at [facility], [place] [address] [place], [place] [address]    Report Status PENDING  Incomplete  Respiratory (~20 pathogens) panel by PCR     Status: None   Collection Time: 02/25/22  9:09 PM   Specimen: Nasopharyngeal Swab; Respiratory  Result Value Ref Range Status   Adenovirus NOT DETECTED NOT DETECTED Final   Coronavirus 229E NOT DETECTED NOT DETECTED Final    Comment: (NOTE) The Coronavirus on the Respiratory Panel, DOES NOT test for the novel  Coronavirus (2019 nCoV)    Coronavirus HKU1 NOT DETECTED NOT DETECTED Final   Coronavirus NL63 NOT DETECTED NOT DETECTED Final   Coronavirus OC43 NOT DETECTED NOT DETECTED Final   Metapneumovirus NOT DETECTED NOT DETECTED Final   Rhinovirus / Enterovirus NOT DETECTED NOT DETECTED Final   Influenza A NOT DETECTED NOT DETECTED Final   Influenza B NOT DETECTED NOT DETECTED Final   Parainfluenza Virus 1 NOT DETECTED NOT DETECTED Final   Parainfluenza Virus 2 NOT DETECTED NOT DETECTED Final   Parainfluenza Virus 3 NOT DETECTED NOT DETECTED Final   Parainfluenza Virus 4 NOT DETECTED NOT DETECTED Final   Respiratory Syncytial Virus NOT DETECTED NOT DETECTED Final   Bordetella pertussis NOT DETECTED NOT DETECTED Final   Bordetella Parapertussis NOT DETECTED NOT DETECTED Final   Chlamydophila pneumoniae NOT DETECTED NOT DETECTED Final   Mycoplasma pneumoniae NOT DETECTED NOT DETECTED Final    Comment: Performed at [facility], [place], [place], [place] [address]  Culture, blood (Routine X 2) w Reflex to ID Panel     Status: None (Preliminary result)   Collection Time: 02/26/22 10:27 AM   Specimen: BLOOD  Result Value Ref Range Status   Specimen Description   Final    BLOOD BLOOD LEFT HAND Performed at [place] [address], [place], [place] [address]    Special Requests   Final    BOTTLES DRAWN AEROBIC AND ANAEROBIC Blood Culture adequate volume Performed at [place] [address] [place], [place] [address]    Culture   Final  NO GROWTH 3 DAYS Performed at [facility], [place] [address], [place], [place] [address]    Report Status PENDING  Incomplete  Urine Culture     Status: None   Collection Time: 02/27/22  8:31 AM   Specimen: Urine, Clean Catch  Result Value Ref Range Status   Specimen Description   Final    URINE, CLEAN CATCH Performed at [facility], [place] [address] [place], [place] [address]    Special Requests   Final    NONE Performed at [facility], [place] [address], [place], [place] [address]    Culture   Final    NO GROWTH Performed at [facility], [place] [address], [place], [place] [address]    Report Status 02/28/2022 FINAL  Final  MRSA Next Gen by PCR, Nasal     Status: None   Collection Time: 02/28/22  7:13 PM   Specimen: Nasal Mucosa; Nasal Swab  Result Value Ref Range Status   MRSA by PCR Next Gen NOT DETECTED NOT DETECTED Final    Comment: (NOTE) The GeneXpert MRSA Assay (FDA approved for NASAL specimens only), is one component of a comprehensive MRSA colonization surveillance program. It is not intended to diagnose MRSA infection nor to guide or monitor treatment for MRSA infections. Test performance is not FDA approved in patients less than 71 years old. Performed at [facility], [place] [address], [place], [place] [address]          Radiology Studies: DG CHEST PORT 1 VIEW  Result Date: 03/01/2022 CLINICAL DATA:  Hypoxemic respiratory failure. EXAM: PORTABLE CHEST 1 VIEW COMPARISON:  02/28/2022 FINDINGS: The right IJ power port is stable. The cardiac silhouette, mediastinal and hilar contours are within normal limits and unchanged. Persistent but slightly improved perihilar  airspace process, likely pulmonary edema. No pleural effusions. IMPRESSION: Persistent but slightly improved perihilar airspace process, likely pulmonary edema. Electronically Signed   By: Marijo Sanes M.D.   On: 03/01/2022 09:13   ECHOCARDIOGRAM COMPLETE  Result Date: 02/28/2022    ECHOCARDIOGRAM REPORT   Patient Name:   [name] Feeley Date of Exam: 02/28/2022 Medical Rec #:  [id number]     Height:       59.5 in Accession #:    [id number]    Weight:       116.8 lb Date of Birth:  [birth year]     BSA:          1.476 m Patient Age:    68 years      BP:           124/76 mmHg Patient Gender: F             HR:           95 bpm. Exam Location:  Inpatient Procedure: 2D Echo Indications:    pulmonary edema  History:        Patient has no prior history of Echocardiogram examinations.                 Risk Factors:Current Smoker, Diabetes and Dyslipidemia.  Sonographer:    Harvie Junior Referring Phys: [id number] CHI JANE ELLISON  Sonographer Comments: Technically difficult study due to poor echo windows and echo performed with patient supine and on artificial respirator. Image acquisition challenging due to respiratory motion. IMPRESSIONS  1. Left ventricular ejection fraction, by estimation, is 60 to 65%. The left ventricle has normal function. Left ventricular endocardial border not  optimally defined to evaluate regional wall motion. Left ventricular diastolic parameters are consistent with Grade I diastolic dysfunction (impaired relaxation).  2. Right ventricular systolic function is normal. The right ventricular size is normal. There is normal pulmonary artery systolic pressure. The estimated right ventricular systolic pressure is 17.4 mmHg.  3. A small pericardial effusion is present. The pericardial effusion is anterior to the right ventricle.  4. The mitral valve is grossly normal. Mild mitral valve regurgitation.  5. The aortic valve is tricuspid. Aortic valve regurgitation is not visualized. Aortic valve sclerosis is  present, with no evidence of aortic valve stenosis. Aortic valve mean gradient measures 3.0 mmHg.  6. The inferior vena cava is normal in size with greater than 50% respiratory variability, suggesting right atrial pressure of 3 mmHg. Comparison(s): No prior Echocardiogram. FINDINGS  Left Ventricle: Left ventricular ejection fraction, by estimation, is 60 to 65%. The left ventricle has normal function. Left ventricular endocardial border not optimally defined to evaluate regional wall motion. The left ventricular internal cavity size was normal in size. There is no left ventricular hypertrophy. Left ventricular diastolic parameters are consistent with Grade I diastolic dysfunction (impaired relaxation). Right Ventricle: The right ventricular size is normal. No increase in right ventricular wall thickness. Right ventricular systolic function is normal. There is normal pulmonary artery systolic pressure. The tricuspid regurgitant velocity is 2.48 m/s, and  with an assumed right atrial pressure of 3 mmHg, the estimated right ventricular systolic pressure is 94.4 mmHg. Left Atrium: Left atrial size was normal in size. Right Atrium: Right atrial size was normal in size. Pericardium: A small pericardial effusion is present. The pericardial effusion is anterior to the right ventricle. Mitral Valve: The mitral valve is grossly normal. Mild mitral valve regurgitation. Tricuspid Valve: The tricuspid valve is grossly normal. Tricuspid valve regurgitation is trivial. Aortic Valve: The aortic valve is tricuspid. Aortic valve regurgitation is not visualized. Aortic valve sclerosis is present, with no evidence of aortic valve stenosis. Aortic valve mean gradient measures 3.0 mmHg. Aortic valve peak gradient measures 5.4  mmHg. Aortic valve area, by VTI measures 2.11 cm. Pulmonic Valve: The pulmonic valve was not well visualized. Pulmonic valve regurgitation is not visualized. Aorta: The aortic root is normal in size and structure.  Venous: The inferior vena cava is normal in size with greater than 50% respiratory variability, suggesting right atrial pressure of 3 mmHg. IAS/Shunts: No atrial level shunt detected by color flow Doppler.  LEFT VENTRICLE PLAX 2D LVIDd:         3.80 cm     Diastology LVIDs:         3.00 cm     LV e' medial:    4.68 cm/s LV PW:         0.90 cm     LV E/e' medial:  18.1 LV IVS:        1.00 cm     LV e' lateral:   4.68 cm/s LVOT diam:     1.80 cm     LV E/e' lateral: 18.1 LV SV:         43 LV SV Index:   29 LVOT Area:     2.54 cm  LV Volumes (MOD) LV vol d, MOD A2C: 36.6 ml LV vol d, MOD A4C: 58.7 ml LV vol s, MOD A2C: 20.3 ml LV vol s, MOD A4C: 28.4 ml LV SV MOD A2C:     16.3 ml LV SV MOD A4C:     58.7 ml  LV SV MOD BP:      24.7 ml RIGHT VENTRICLE RV Basal diam:  3.00 cm RV Mid diam:    2.30 cm TAPSE (M-mode): 1.3 cm LEFT ATRIUM             Index        RIGHT ATRIUM          Index LA diam:        3.00 cm 2.03 cm/m   RA Area:     7.13 cm LA Vol (A2C):   19.6 ml 13.28 ml/m  RA Volume:   11.20 ml 7.59 ml/m LA Vol (A4C):   14.1 ml 9.55 ml/m LA Biplane Vol: 17.9 ml 12.13 ml/m  AORTIC VALVE                    PULMONIC VALVE AV Area (Vmax):    1.99 cm     PV Vmax:       0.99 m/s AV Area (Vmean):   1.95 cm     PV Peak grad:  3.9 mmHg AV Area (VTI):     2.11 cm AV Vmax:           116.00 cm/s AV Vmean:          80.200 cm/s AV VTI:            0.205 m AV Peak Grad:      5.4 mmHg AV Mean Grad:      3.0 mmHg LVOT Vmax:         90.70 cm/s LVOT Vmean:        61.400 cm/s LVOT VTI:          0.170 m LVOT/AV VTI ratio: 0.83  AORTA Ao Root diam: 2.80 cm MITRAL VALVE                TRICUSPID VALVE MV Area (PHT): 4.49 cm     TR Peak grad:   24.6 mmHg MV Decel Time: 169 msec     TR Vmax:        248.00 cm/s MR Peak grad: 103.1 mmHg MR Vmax:      507.80 cm/s   SHUNTS MV E velocity: 84.80 cm/s   Systemic VTI:  0.17 m MV A velocity: 115.00 cm/s  Systemic Diam: 1.80 cm MV E/A ratio:  0.74 Rozann Lesches MD Electronically signed by  Rozann Lesches MD Signature Date/Time: 02/28/2022/1:48:44 PM    Final    DG Chest 2 View  Result Date: 02/28/2022 CLINICAL DATA:  Shortness of breath. EXAM: CHEST - 2 VIEW COMPARISON:  February 25, 2022. FINDINGS: Stable cardiomediastinal silhouette. Right internal jugular Port-A-Cath is unchanged in position. Mildly increased bilateral diffuse reticular opacities are noted concerning for worsening edema or atypical infection. Bony thorax is unremarkable. IMPRESSION: Increased bilateral lung opacities as described above. Electronically Signed   By: Marijo Conception M.D.   On: 02/28/2022 08:57        Scheduled Meds:  apixaban  10 mg Oral BID   Followed by   Derrill Memo ON 03/05/2022] apixaban  5 mg Oral BID   bisacodyl  10 mg Rectal Daily   budesonide (PULMICORT) nebulizer solution  0.25 mg Nebulization BID   Chlorhexidine Gluconate Cloth  6 each Topical Daily   FLUoxetine  20 mg Oral Daily   fluticasone  2 spray Each Nare Daily   furosemide  40 mg Intravenous Daily   furosemide  40 mg Intravenous Once   insulin aspart  0-15  Units Subcutaneous TID WC   insulin aspart  0-5 Units Subcutaneous QHS   insulin aspart  4 Units Subcutaneous TID WC   insulin glargine-yfgn  34 Units Subcutaneous QHS   ipratropium-albuterol  3 mL Nebulization TID   levothyroxine  125 mcg Oral Q0600   loratadine  10 mg Oral Daily   methylPREDNISolone (SOLU-MEDROL) injection  60 mg Intravenous Daily   mouth rinse  15 mL Mouth Rinse 4 times per day   oxyCODONE  10 mg Oral Q12H   pantoprazole  40 mg Oral Daily   polyethylene glycol  17 g Oral BID   senna-docusate  2 tablet Oral BID   Continuous Infusions:  azithromycin     ceFEPime (MAXIPIME) IV Stopped (03/01/22 0956)     LOS: 3 days    Time spent: 35 minutes    Irine Seal, MD [facility]   To contact the attending provider between 7A-7P or the covering provider during after hours 7P-7A, please log into the web site [email or web address] and  access using universal [place] password for that web site. If you do not have the password, please call the hospital operator.  03/01/2022, 2:53 PM

## 2022-03-01 NOTE — Progress Notes (Addendum)
PCCM Progress Note  Required ongoing BiPAP due to desaturations. Diuresed. Transitioned to [place] FIO2 80% Flow 35L. Repeat diuresis.   Discussed GOC per IPAL with patient and her family including talking to members separately on the phone. Patient wishes to be DNR/DNI.  Guarded prognosis. If she does not do well on current respiratory management, she would consider comfort care.  Critical Care Time: 35 min  The patient is critically ill with acute hypoxemic respiratory failure and requires high complexity decision making for assessment and support, frequent evaluation and titration of therapies, application of advanced monitoring technologies and extensive interpretation of multiple databases.    Rodman Pickle, M.D. [facility] Pulmonary/Critical Care Medicine 03/01/2022 3:13 PM   Please see Amion for pager number to reach on-call Pulmonary and Critical Care Team.

## 2022-03-02 ENCOUNTER — Inpatient Hospital Stay (HOSPITAL_COMMUNITY): Payer: PPO

## 2022-03-02 DIAGNOSIS — J9601 Acute respiratory failure with hypoxia: Secondary | ICD-10-CM | POA: Diagnosis not present

## 2022-03-02 DIAGNOSIS — I2693 Single subsegmental pulmonary embolism without acute cor pulmonale: Secondary | ICD-10-CM | POA: Diagnosis not present

## 2022-03-02 DIAGNOSIS — K5909 Other constipation: Secondary | ICD-10-CM | POA: Diagnosis not present

## 2022-03-02 DIAGNOSIS — J189 Pneumonia, unspecified organism: Secondary | ICD-10-CM | POA: Diagnosis not present

## 2022-03-02 LAB — CBC WITH DIFFERENTIAL/PLATELET
Abs Immature Granulocytes: 0.08 10*3/uL — ABNORMAL HIGH (ref 0.00–0.07)
Basophils Absolute: 0 10*3/uL (ref 0.0–0.1)
Basophils Relative: 0 %
Eosinophils Absolute: 0 10*3/uL (ref 0.0–0.5)
Eosinophils Relative: 0 %
HCT: 31.7 % — ABNORMAL LOW (ref 36.0–46.0)
Hemoglobin: 9.8 g/dL — ABNORMAL LOW (ref 12.0–15.0)
Immature Granulocytes: 1 %
Lymphocytes Relative: 5 %
Lymphs Abs: 0.7 10*3/uL (ref 0.7–4.0)
MCH: 28.7 pg (ref 26.0–34.0)
MCHC: 30.9 g/dL (ref 30.0–36.0)
MCV: 93 fL (ref 80.0–100.0)
Monocytes Absolute: 0.8 10*3/uL (ref 0.1–1.0)
Monocytes Relative: 6 %
Neutro Abs: 11.6 10*3/uL — ABNORMAL HIGH (ref 1.7–7.7)
Neutrophils Relative %: 88 %
Platelets: 349 10*3/uL (ref 150–400)
RBC: 3.41 MIL/uL — ABNORMAL LOW (ref 3.87–5.11)
RDW: 13.3 % (ref 11.5–15.5)
WBC: 13.2 10*3/uL — ABNORMAL HIGH (ref 4.0–10.5)
nRBC: 0 % (ref 0.0–0.2)

## 2022-03-02 LAB — GLUCOSE, CAPILLARY
Glucose-Capillary: 146 mg/dL — ABNORMAL HIGH (ref 70–99)
Glucose-Capillary: 79 mg/dL (ref 70–99)
Glucose-Capillary: 378 mg/dL — ABNORMAL HIGH (ref 70–99)
Glucose-Capillary: 315 mg/dL — ABNORMAL HIGH (ref 70–99)

## 2022-03-02 LAB — COMPREHENSIVE METABOLIC PANEL
ALT: 56 U/L — ABNORMAL HIGH (ref 0–44)
AST: 57 U/L — ABNORMAL HIGH (ref 15–41)
Albumin: 2.7 g/dL — ABNORMAL LOW (ref 3.5–5.0)
Alkaline Phosphatase: 58 U/L (ref 38–126)
Anion gap: 9 (ref 5–15)
BUN: 29 mg/dL — ABNORMAL HIGH (ref 8–23)
CO2: 28 mmol/L (ref 22–32)
Calcium: 7.8 mg/dL — ABNORMAL LOW (ref 8.9–10.3)
Chloride: 102 mmol/L (ref 98–111)
Creatinine, Ser: 0.48 mg/dL (ref 0.44–1.00)
GFR, Estimated: >60 mL/min (ref >60)
Glucose, Bld: 152 mg/dL — ABNORMAL HIGH (ref 70–99)
Potassium: 4.6 mmol/L (ref 3.5–5.1)
Sodium: 139 mmol/L (ref 135–145)
Total Bilirubin: 0.6 mg/dL (ref 0.3–1.2)
Total Protein: 7.2 g/dL (ref 6.5–8.1)

## 2022-03-02 LAB — CULTURE, BLOOD (ROUTINE X 2)
Culture: NO GROWTH
Special Requests: ADEQUATE

## 2022-03-02 LAB — APTT: aPTT: 25 seconds (ref 24–36)

## 2022-03-02 LAB — PROCALCITONIN: Procalcitonin: <0.1 ng/mL

## 2022-03-02 LAB — LACTATE DEHYDROGENASE: LDH: 385 U/L — ABNORMAL HIGH (ref 98–192)

## 2022-03-02 LAB — HEPARIN LEVEL (UNFRACTIONATED): Heparin Unfractionated: >1.1 IU/mL — ABNORMAL HIGH (ref 0.30–0.70)

## 2022-03-02 MED ORDER — ENOXAPARIN SODIUM 60 MG/0.6ML IJ SOSY
55.0000 mg | PREFILLED_SYRINGE | Freq: Two times a day (BID) | INTRAMUSCULAR | Status: DC
  Administered 2022-03-02 – 2022-03-07 (×11): 55 mg via SUBCUTANEOUS
  Filled 2022-03-02 (×11): qty 0.6

## 2022-03-02 MED ORDER — PREDNISONE 20 MG PO TABS
50.0000 mg | ORAL_TABLET | Freq: Every day | ORAL | Status: DC
  Administered 2022-03-02 – 2022-03-08 (×7): 50 mg via ORAL
  Filled 2022-03-02 (×7): qty 1

## 2022-03-02 MED ORDER — FUROSEMIDE 10 MG/ML IJ SOLN
40.0000 mg | Freq: Three times a day (TID) | INTRAMUSCULAR | Status: DC
  Administered 2022-03-02 – 2022-03-03 (×4): 40 mg via INTRAVENOUS
  Filled 2022-03-02 (×4): qty 4

## 2022-03-02 MED ORDER — METHYLPREDNISOLONE SODIUM SUCC 125 MG IJ SOLR: 60.0000 mg | Freq: Two times a day (BID) | INTRAMUSCULAR | Status: DC

## 2022-03-02 MED ORDER — HEPARIN (PORCINE) 25000 UT/250ML-% IV SOLN: 800.0000 [IU]/h | INTRAVENOUS | Status: DC

## 2022-03-02 MED ORDER — MORPHINE SULFATE (PF) 2 MG/ML IV SOLN
2.0000 mg | INTRAVENOUS | Status: DC | PRN
  Administered 2022-03-02: 2 mg via INTRAVENOUS
  Filled 2022-03-02: qty 1

## 2022-03-02 NOTE — Progress Notes (Signed)
Unfortunately, [name] was brought down to the ICU I think starting 9.  She does have more breathing difficulty.  She had a chest x-ray which actually showed some improvement in the right perihilar density.  They felt that this may have been pulmonary edema.  My concern would be the possibility that this pulmonary embolism could be a little bit more prominent.  She has been on Eliquis.  I probably would switch her from Eliquis to something more aggressive.  I probably would have her on heparin right now..  She is on antibiotics.  She is on steroids.  She is getting tired.  She may not wish to have any additional therapy.  The that her cancer has responded very nicely to treatment.  She was due for a PET scan this Wednesday.  I am sure that she will be in the hospital.  As such, the PET scan probably want to be moved out.  She says she is swallowing okay.  I see some notes about her having some difficulty swallowing.  She has had no fever.  There is been no nausea or vomiting.  She has had no bleeding.  Her labs show a blood sugar 152.  Her BUN is 29 creatinine 0.48.  Calcium is 7.8 with an albumin of 2.7.  Her LFTs are slightly elevated.  LDH is 385.  The white count 13.2.  Hemoglobin 9.8.  Platelet count 3 49,000.   Her vital signs show temperature 97.6.  Pulse 85.  Blood pressure 112/59.  Her lungs sound all right.  She has decent air movement bilaterally.  I hear occasional wheezing.  Cardiac exam regular rate and rhythm.  She has no murmurs.  Abdomen soft.  Bowel sounds are present.  She has no fluid wave.  Extremity shows no clubbing, cyanosis or edema.  Neurological exam is nonfocal.  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  She has gotten chemotherapy with immunotherapy.  She completed treatment back in June.  She then underwent radiation therapy because of the bulky disease in her chest.  She completed this almost 2 months ago.  Our goal was to try again  on maintenance immunotherapy.  Again, this is on hold right now.  She has a pulmonary embolism.  Again I would probably get her on heparin.  I think this would be a reasonable for right now.  When he comes to malignancy and blood clots, I just have more confidence in heparin and Lovenox.  I realize that this is much more of an inconvenience for her.  Hopefully, she will improve and she will be able to get out of the ICU.  Another issue is getting incredible care from everybody in the ICU.  [name], MD  [facility] Joiner 2:7

## 2022-03-02 NOTE — Progress Notes (Signed)
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  Patient noted prior to admission to have some chills, body aches, fever as high as 101.  Patient noted to have sats of 89% on room air and sent to the ED from urgent care center.  COVID-19 PCR negative, influenza a and B PCR negative, respiratory viral panel negative.  CT chest done with segmental PE, concern for pneumonia.  Patient placed empirically on IV antibiotics, anticoagulation.  PCCM consulted due to concerns for radiation pneumonitis.    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   -Likely multifactorial secondary to pneumonia in a patient with immunosuppression, concern for radiation pneumonitis and also noted to have segmental PE which is unlikely source of hypoxia.   Also concern for possible volume overload as patient noted to have worsening hypoxia on 02/28/2022 required BiPAP and subsequently transferred to the stepdown unit.   -Still with significant O2 requirements.  Currently on high flow nasal cannula  with a FiO2 of 80%, flow rate of 35.  Off BiPAP this morning.   -COVID-19 PCR negative, influenza A and B negative, respiratory viral panel negative.   -CT angiogram chest with pulmonary emboli focal in the right upper lung distal segmental and subsegmental vessels, developing patchy airspace interstitial infiltrates most likely representing multifocal pneumonia, prominent mediastinal and abdominal lymph nodes likely metastatic, multiple bone mets.   -Patient with complaints of chills and fatigue.   -Noted to have a Tmax of 101.6 (02/27/2022 ). -Urine strep pneumococcus antigen negative.  Urine Legionella antigen negative. -Due to Tmax of 101.6 antibiotic coverage was broadened to IV cefepime and IV azithromycin.   -IV Rocephin discontinued.   -Patient pancultured.  -Continue scheduled DuoNebs, Claritin, PPI, Pulmicort -Anticoagulation being changed from Eliquis to heparin per hematology/oncology recommendations this morning.  -PCCM consulted due to concerns for radiation pneumonitis and patient started on a prednisone taper. -Due to worsening hypoxia chest x-ray obtained concerning for possible volume overload and patient placed on IV Lasix. -Chest x-ray with slight improvement however due to ongoing hypoxia although prednisone changed to IV Solu-Medrol and dose increased per PCCM to 60 mg daily (03/01/2022) -IV Solu-Medrol changed to oral prednisone 50 mg daily per PCCM today. -Patient has been changed from ICS/LABA to Pulmicort nebs, continue diuresis per PCCM and no significant improvement in O2 requirements may need repeat CT chest. -PCCM following.  Subsegmental pulmonary embolism without acute cor pulmonale (HCC) Small PE. Doubt this would cause cardiac issues.  -Patient initially placed on IV heparin in the ED and has been transitioned to Eliquis.  -Patient  on BiPAP and due to worsening respiratory status hematology/oncology recommending changing from Eliquis to IV heparin.   -Hematology/oncology following.  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 Followed by oncology. Had 4 sessions of XRT in October 2023. Last session on 01-08-2022. Last received IV chemo July 2023. -Oncology following.  Type 2 diabetes mellitus without complication, with long-term current use of insulin (HCC) Stable.  -Hemoglobin A1c 11.3 (02/21/2021 ) -Repeat hemoglobin A1c 10.4 (02/26/2022). -CBG 146 this morning.  -Elevated CBG secondary to steroids. -Continue Semglee to 34 units daily, meal coverage NovoLog 4 units 3 times daily with meals.   -SSI.    Hypothyroidism - Continue home regimen Synthroid 125 mcg daily.   Anemia -Patient with no overt bleeding. -Anemia likely secondary to acute illness and radiation treatment. -Hemoglobin stable at 9.8. -Follow H&H. -Transfusion threshold hemoglobin < 7  Hypophosphatemia -Repleted.   -Phosphorus at 3.2.    Hypomagnesemia -Repleted.   Magnesium at 2.1. -Repeat labs in AM.  Hypokalemia -Repleted, potassium of 4.6.   -Magnesium at 2.1. -Repeat labs in AM.  Other constipation -Continue bowel regimen of MiraLAX twice daily, Senokot-S twice daily, Dulcolax suppository.  Hyponatremia - TSH 2.597.  -Free T4  1.28.   -Urine sodium 142, urine potassium 27,  -Concern hyponatremia may be secondary to small cell lung cancer.  -Sodium levels improved currently at 139 this morning.  -Repeat labs in the AM.          DVT prophylaxis: Eliquis>>> heparin Code Status: Full Family Communication: Updated patient.  No family at bedside. Disposition: TBD.  Currently in stepdown unit on BiPAP.   Status is: Inpatient Remains inpatient appropriate because: Severity of illness   Consultants:  PCCM: Dr. Loanne Drilling 02/26/2022 Oncology: Dr. Alvy Bimler 02/26/2022  Procedures:  Chest x-ray 02/25/2022, 02/28/2022, 03/01/2022 CT angiogram chest 02/25/2022   Antimicrobials:  IV azithromycin  02/25/2022>>>> IV Rocephin 02/25/2022>>>> 02/27/2022 IV cefepime 02/27/2022   Subjective: Sitting up in bed on high flow nasal cannula, states she is feeling a little bit better today.  Stated she got something to eat.  Denies any chest pain.  No abdominal pain.  Best friend at bedside.    Objective: Vitals:   03/02/22 0500 03/02/22 0600 03/02/22 0837 03/02/22 0839  BP:  (!) 112/59    Pulse:  85    Resp:  20    Temp:      TempSrc:      SpO2:  91% 93% 90%  Weight: 53.5 kg     Height:        Intake/Output Summary (Last 24 hours) at 03/02/2022 1008 Last data filed at 03/02/2022 0600 Gross per 24 hour  Intake 590 ml  Output 1500 ml  Net -910 ml   Filed Weights   02/28/22 1906 03/01/22 0500 03/02/22 0500  Weight: 52.2 kg 53.2 kg 53.5 kg    Examination:  General exam: NAD. Port-A-Cath in right upper chest wall with no surrounding erythema, nontender to palpation. Respiratory system: Decreased diffuse crackles bilaterally, some fine diffuse crackles noted in the bases.  No rhonchi.  No wheezing.  Fair air movement.  Currently off BiPAP and on high flow nasal cannula with a flow rate of 35, FiO2 of 80%.  Cardiovascular system: Regular rate rhythm no murmurs rubs or gallops.  No JVD.  No lower extremity edema.  Gastrointestinal system: Abdomen is soft, nontender, nondistended, positive bowel sounds.  No rebound.  No guarding.  Central nervous system: Alert and  oriented. No focal neurological deficits. Extremities: Symmetric 5 x 5 power. Skin: No rashes, lesions or ulcers Psychiatry: Judgement and insight appear normal. Mood & affect appropriate.     Data Reviewed:   CBC: Recent Labs  Lab 02/25/22 1656 02/26/22 0333 02/26/22 1155 02/27/22 1749 02/28/22 0316 03/01/22 0500 03/02/22 0433  WBC 6.2   < > 6.6 11.0* 10.5 13.5* 13.2*  NEUTROABS 4.8  --   --  10.4* 8.9* 11.6* 11.6*  HGB 11.1*   < > 9.7* 10.3* 9.5* 9.6* 9.8*  HCT 34.4*   < > 30.0* 31.8* 30.0* 30.4* 31.7*   MCV 89.6   < > 90.1 91.1 91.7 92.1 93.0  PLT 241   < > 226 259 266 288 349   < > = values in this interval not displayed.    Basic Metabolic Panel: Recent Labs  Lab 02/26/22 1155 02/27/22 0316 02/28/22 0316 03/01/22 0500 03/02/22 0433  NA 134* 133* 135 138 139  K 3.4* 4.8 4.4 4.3 4.6  CL 102 100 103 101 102  CO2 26 25 25 28 28   GLUCOSE 236* 283* 184* 185* 152*  BUN 11 7* 15 26* 29*  CREATININE 0.54 0.52 0.55 0.62 0.48  CALCIUM 8.3* 7.7* 8.0* 8.0* 7.8*  MG 1.5* 2.5*  --  2.1  --   PHOS 1.7* 3.2  --   --   --     GFR: Estimated Creatinine Clearance: 48.9 mL/min (by C-G formula based on SCr of 0.48 mg/dL).  Liver Function Tests: Recent Labs  Lab 02/25/22 1656 02/26/22 1155 02/27/22 0316 02/28/22 0316 03/01/22 0500 03/02/22 0433  AST 21  --   --  25 48* 57*  ALT 20  --   --  20 39 56*  ALKPHOS 64  --   --  52 59 58  BILITOT 0.7  --   --  0.7 0.6 0.6  PROT 8.0  --   --  6.9 7.4 7.2  ALBUMIN 3.2* 2.9* 2.8* 2.7* 2.7* 2.7*    CBG: Recent Labs  Lab 03/01/22 0803 03/01/22 1140 03/01/22 1633 03/01/22 2150 03/02/22 0756  GLUCAP 170* 134* 280* 186* 146*     Recent Results (from the past 240 hour(s))  Resp Panel by RT-PCR (Flu A&B, Covid) Anterior Nasal Swab     Status: None   Collection Time: 02/25/22  4:33 PM   Specimen: Anterior Nasal Swab  Result Value Ref Range Status   SARS Coronavirus 2 by RT PCR NEGATIVE NEGATIVE Final    Comment: (NOTE) SARS-CoV-2 target nucleic acids are NOT DETECTED.  The SARS-CoV-2 RNA is generally detectable in upper respiratory specimens during the acute phase of infection. The lowest concentration of SARS-CoV-2 viral copies this assay can detect is 138 copies/mL. A negative result does not preclude SARS-Cov-2 infection and should not be used as the sole basis for treatment or other patient management decisions. A negative result may occur with  improper specimen collection/handling, submission of specimen other than  nasopharyngeal swab, presence of viral mutation(s) within the areas targeted by this assay, and inadequate number of viral copies(<138 copies/mL). A negative result must be combined with clinical observations, patient history, and epidemiological information. The expected result is Negative.  Fact Sheet for Patients:  EntrepreneurPulse.com.au  Fact Sheet for Healthcare Providers:  IncredibleEmployment.be  This test is no t yet approved or cleared by the [place] FDA and  has been authorized for detection and/or diagnosis of SARS-CoV-2 by FDA under an Emergency Use Authorization (EUA).  This EUA will remain  in effect (meaning this test can be used) for the duration of the COVID-19 declaration under Section 564(b)(1) of the Act, 21 U.S.C.section 360bbb-3(b)(1), unless the authorization is terminated  or revoked sooner.       Influenza A by PCR NEGATIVE NEGATIVE Final   Influenza B by PCR NEGATIVE NEGATIVE Final    Comment: (NOTE) The Xpert Xpress SARS-CoV-2/FLU/RSV plus assay is intended as an aid in the diagnosis of influenza from Nasopharyngeal swab specimens and should not be used as a sole basis for treatment. Nasal washings and aspirates are unacceptable for Xpert Xpress SARS-CoV-2/FLU/RSV testing.  Fact Sheet for Patients: EntrepreneurPulse.com.au  Fact Sheet for Healthcare Providers: IncredibleEmployment.be  This test is not yet approved or cleared by the [place] FDA and has been authorized for detection and/or diagnosis of SARS-CoV-2 by FDA under an Emergency Use Authorization (EUA). This EUA will remain in effect (meaning this test can be used) for the duration of the COVID-19 declaration under Section 564(b)(1) of the Act, 21 U.S.C. section 360bbb-3(b)(1), unless the authorization is terminated or revoked.  Performed at [facility], [address] [place], [place]  [address]   Culture, blood (Routine X 2) w Reflex to ID Panel     Status: None   Collection Time: 02/25/22  4:57 PM   Specimen: BLOOD  Result Value Ref Range Status   Specimen Description   Final    BLOOD BOTTLES DRAWN AEROBIC AND ANAEROBIC LEFT ANTECUBITAL Performed at [facility], [address] [place], [place] [address]    Special Requests   Final    Blood Culture adequate volume Performed at [facility], [address] [place], [place] [address]    Culture   Final    NO GROWTH 5 DAYS Performed at [facility], [place] [address], [place], [place] [address]    Report Status 03/02/2022 FINAL  Final  Respiratory (~20 pathogens) panel by PCR     Status: None   Collection Time: 02/25/22  9:09 PM   Specimen: Nasopharyngeal Swab; Respiratory  Result Value Ref Range Status   Adenovirus NOT DETECTED NOT DETECTED Final   Coronavirus 229E NOT DETECTED NOT DETECTED Final    Comment: (NOTE) The Coronavirus on the Respiratory Panel, DOES NOT test for the novel  Coronavirus (2019 nCoV)    Coronavirus HKU1 NOT DETECTED NOT DETECTED Final   Coronavirus NL63 NOT DETECTED NOT DETECTED Final   Coronavirus OC43 NOT DETECTED NOT DETECTED Final   Metapneumovirus NOT DETECTED NOT DETECTED Final   Rhinovirus / Enterovirus NOT DETECTED NOT DETECTED Final   Influenza A NOT DETECTED NOT DETECTED Final   Influenza B NOT DETECTED NOT DETECTED Final   Parainfluenza Virus 1 NOT DETECTED NOT DETECTED Final   Parainfluenza Virus 2 NOT DETECTED NOT DETECTED Final   Parainfluenza Virus 3 NOT DETECTED NOT DETECTED Final   Parainfluenza Virus 4 NOT DETECTED NOT DETECTED Final   Respiratory Syncytial Virus NOT DETECTED NOT DETECTED Final   Bordetella pertussis NOT DETECTED NOT DETECTED Final   Bordetella Parapertussis NOT DETECTED NOT DETECTED Final   Chlamydophila pneumoniae NOT DETECTED NOT DETECTED Final   Mycoplasma pneumoniae NOT DETECTED NOT DETECTED Final    Comment:  Performed at [facility], [place] [address] [place], [place] [address]  Culture, blood (Routine X 2) w Reflex to ID Panel     Status: None (Preliminary result)   Collection Time: 02/26/22  10:27 AM   Specimen: BLOOD  Result Value Ref Range Status   Specimen Description   Final    BLOOD BLOOD LEFT HAND Performed at [facility] [address] [place], [place] [address]    Special Requests   Final    BOTTLES DRAWN AEROBIC AND ANAEROBIC Blood Culture adequate volume Performed at [place] [address] [place], [place] [address]    Culture   Final    NO GROWTH 4 DAYS Performed at [facility], [place] [address], [place], [place] [address]    Report Status PENDING  Incomplete  Urine Culture     Status: None   Collection Time: 02/27/22  8:31 AM   Specimen: Urine, Clean Catch  Result Value Ref Range Status   Specimen Description   Final    URINE, CLEAN CATCH Performed at [facility], [place] [address] [place], [place] [address]    Special Requests   Final    NONE Performed at [facility], [place] [address] [place], [place] [address]    Culture   Final    NO GROWTH Performed at [facility], [place] [address], [place], [place] [address]    Report Status 02/28/2022 FINAL  Final  MRSA Next Gen by PCR, Nasal     Status: None   Collection Time: 02/28/22  7:13 PM   Specimen: Nasal Mucosa; Nasal Swab  Result Value Ref Range Status   MRSA by PCR Next Gen NOT DETECTED NOT DETECTED Final    Comment: (NOTE) The GeneXpert MRSA Assay (FDA approved for NASAL specimens only), is one component of a comprehensive MRSA colonization surveillance program. It is not intended to diagnose MRSA infection nor to guide or monitor treatment for MRSA infections. Test performance is not FDA approved in patients less than 46 years old. Performed at [facility], [place] [address], [place],  [place] [address]          Radiology Studies: DG CHEST PORT 1 VIEW  Result Date: 03/02/2022 CLINICAL DATA:  Acute respiratory failure with hypoxia. EXAM: PORTABLE CHEST 1 VIEW COMPARISON:  AP chest 03/01/2022, chest two views 02/28/2022, AP chest 02/25/2022, chest two views 07/25/2021;, PET-CT 11/03/2021 FINDINGS: Right chest wall porta catheter tip again overlies the superior aspect of the right atrium. Cardiac silhouette is again at the upper limits of normal size. Mediastinal contours are within normal limits. Left mid and lower lung and right lower lung interstitial thickening is similar to multiple prior radiographs from 03/01/2022 through 02/25/2022. Mildly increased hazy opacification of the left mid and lower lung and right lower lung compared to 07/25/2020. No definite pleural effusion. No pneumothorax. Minimal dextrocurvature of the midthoracic spine. IMPRESSION: Mildly increased hazy opacification of the left mid and lower lung and right lower lung compared to 07/25/2020 but unchanged from more recent November 2023 radiographs over the course of this past week. Has this patient undergone radiation therapy for left hilar lymphadenopathy seen on 11/03/2021 PET-CT? If so, some of the lung findings may represent postradiation change. Otherwise, differential considerations include chronic interstitial thickening with superimposed pulmonary edema versus multifocal infection within the left-greater-than-right lungs. Electronically Signed   By: Yvonne Kendall M.D.   On: 03/02/2022 08:34   DG CHEST PORT 1 VIEW  Result Date: 03/01/2022 CLINICAL DATA:  Hypoxemic respiratory failure. EXAM: PORTABLE CHEST 1 VIEW COMPARISON:  02/28/2022 FINDINGS: The right IJ power port is stable. The cardiac silhouette, mediastinal and  hilar contours are within normal limits and unchanged. Persistent but slightly improved perihilar airspace process, likely pulmonary edema. No pleural effusions. IMPRESSION: Persistent but  slightly improved perihilar airspace process, likely pulmonary edema. Electronically Signed   By: Marijo Sanes M.D.   On: 03/01/2022 09:13   ECHOCARDIOGRAM COMPLETE  Result Date: 02/28/2022    ECHOCARDIOGRAM REPORT   Patient Name:   RIKKI TROSPER Greb Date of Exam: 02/28/2022 Medical Rec #:  [id number]     Height:       59.5 in Accession #:    [id number]    Weight:       116.8 lb Date of Birth:  September 23, 1950     BSA:          1.476 m Patient Age:    60 years      BP:           124/76 mmHg Patient Gender: F             HR:           95 bpm. Exam Location:  Inpatient Procedure: 2D Echo Indications:    pulmonary edema  History:        Patient has no prior history of Echocardiogram examinations.                 Risk Factors:Current Smoker, Diabetes and Dyslipidemia.  Sonographer:    Harvie Junior Referring Phys: [id number] CHI JANE ELLISON  Sonographer Comments: Technically difficult study due to poor echo windows and echo performed with patient supine and on artificial respirator. Image acquisition challenging due to respiratory motion. IMPRESSIONS  1. Left ventricular ejection fraction, by estimation, is 60 to 65%. The left ventricle has normal function. Left ventricular endocardial border not optimally defined to evaluate regional wall motion. Left ventricular diastolic parameters are consistent with Grade I diastolic dysfunction (impaired relaxation).  2. Right ventricular systolic function is normal. The right ventricular size is normal. There is normal pulmonary artery systolic pressure. The estimated right ventricular systolic pressure is 56.3 mmHg.  3. A small pericardial effusion is present. The pericardial effusion is anterior to the right ventricle.  4. The mitral valve is grossly normal. Mild mitral valve regurgitation.  5. The aortic valve is tricuspid. Aortic valve regurgitation is not visualized. Aortic valve sclerosis is present, with no evidence of aortic valve stenosis. Aortic valve mean gradient measures  3.0 mmHg.  6. The inferior vena cava is normal in size with greater than 50% respiratory variability, suggesting right atrial pressure of 3 mmHg. Comparison(s): No prior Echocardiogram. FINDINGS  Left Ventricle: Left ventricular ejection fraction, by estimation, is 60 to 65%. The left ventricle has normal function. Left ventricular endocardial border not optimally defined to evaluate regional wall motion. The left ventricular internal cavity size was normal in size. There is no left ventricular hypertrophy. Left ventricular diastolic parameters are consistent with Grade I diastolic dysfunction (impaired relaxation). Right Ventricle: The right ventricular size is normal. No increase in right ventricular wall thickness. Right ventricular systolic function is normal. There is normal pulmonary artery systolic pressure. The tricuspid regurgitant velocity is 2.48 m/s, and  with an assumed right atrial pressure of 3 mmHg, the estimated right ventricular systolic pressure is 87.5 mmHg. Left Atrium: Left atrial size was normal in size. Right Atrium: Right atrial size was normal in size. Pericardium: A small pericardial effusion is present. The pericardial effusion is anterior to the right ventricle. Mitral Valve: The mitral valve is grossly normal. Mild mitral valve  regurgitation. Tricuspid Valve: The tricuspid valve is grossly normal. Tricuspid valve regurgitation is trivial. Aortic Valve: The aortic valve is tricuspid. Aortic valve regurgitation is not visualized. Aortic valve sclerosis is present, with no evidence of aortic valve stenosis. Aortic valve mean gradient measures 3.0 mmHg. Aortic valve peak gradient measures 5.4  mmHg. Aortic valve area, by VTI measures 2.11 cm. Pulmonic Valve: The pulmonic valve was not well visualized. Pulmonic valve regurgitation is not visualized. Aorta: The aortic root is normal in size and structure. Venous: The inferior vena cava is normal in size with greater than 50% respiratory  variability, suggesting right atrial pressure of 3 mmHg. IAS/Shunts: No atrial level shunt detected by color flow Doppler.  LEFT VENTRICLE PLAX 2D LVIDd:         3.80 cm     Diastology LVIDs:         3.00 cm     LV e' medial:    4.68 cm/s LV PW:         0.90 cm     LV E/e' medial:  18.1 LV IVS:        1.00 cm     LV e' lateral:   4.68 cm/s LVOT diam:     1.80 cm     LV E/e' lateral: 18.1 LV SV:         43 LV SV Index:   29 LVOT Area:     2.54 cm  LV Volumes (MOD) LV vol d, MOD A2C: 36.6 ml LV vol d, MOD A4C: 58.7 ml LV vol s, MOD A2C: 20.3 ml LV vol s, MOD A4C: 28.4 ml LV SV MOD A2C:     16.3 ml LV SV MOD A4C:     58.7 ml LV SV MOD BP:      24.7 ml RIGHT VENTRICLE RV Basal diam:  3.00 cm RV Mid diam:    2.30 cm TAPSE (M-mode): 1.3 cm LEFT ATRIUM             Index        RIGHT ATRIUM          Index LA diam:        3.00 cm 2.03 cm/m   RA Area:     7.13 cm LA Vol (A2C):   19.6 ml 13.28 ml/m  RA Volume:   11.20 ml 7.59 ml/m LA Vol (A4C):   14.1 ml 9.55 ml/m LA Biplane Vol: 17.9 ml 12.13 ml/m  AORTIC VALVE                    PULMONIC VALVE AV Area (Vmax):    1.99 cm     PV Vmax:       0.99 m/s AV Area (Vmean):   1.95 cm     PV Peak grad:  3.9 mmHg AV Area (VTI):     2.11 cm AV Vmax:           116.00 cm/s AV Vmean:          80.200 cm/s AV VTI:            0.205 m AV Peak Grad:      5.4 mmHg AV Mean Grad:      3.0 mmHg LVOT Vmax:         90.70 cm/s LVOT Vmean:        61.400 cm/s LVOT VTI:          0.170 m LVOT/AV VTI ratio: 0.83  AORTA Ao Root  diam: 2.80 cm MITRAL VALVE                TRICUSPID VALVE MV Area (PHT): 4.49 cm     TR Peak grad:   24.6 mmHg MV Decel Time: 169 msec     TR Vmax:        248.00 cm/s MR Peak grad: 103.1 mmHg MR Vmax:      507.80 cm/s   SHUNTS MV E velocity: 84.80 cm/s   Systemic VTI:  0.17 m MV A velocity: 115.00 cm/s  Systemic Diam: 1.80 cm MV E/A ratio:  0.74 Rozann Lesches MD Electronically signed by Rozann Lesches MD Signature Date/Time: 02/28/2022/1:48:44 PM    Final          Scheduled Meds:  bisacodyl  10 mg Rectal Daily   budesonide (PULMICORT) nebulizer solution  0.25 mg Nebulization BID   Chlorhexidine Gluconate Cloth  6 each Topical Daily   enoxaparin (LOVENOX) injection  55 mg Subcutaneous BID   FLUoxetine  20 mg Oral Daily   fluticasone  2 spray Each Nare Daily   furosemide  40 mg Intravenous Q8H   insulin aspart  0-15 Units Subcutaneous TID WC   insulin aspart  0-5 Units Subcutaneous QHS   insulin aspart  4 Units Subcutaneous TID WC   insulin glargine-yfgn  34 Units Subcutaneous QHS   ipratropium-albuterol  3 mL Nebulization TID   levothyroxine  125 mcg Oral Q0600   loratadine  10 mg Oral Daily   mouth rinse  15 mL Mouth Rinse 4 times per day   oxyCODONE  10 mg Oral Q12H   pantoprazole  40 mg Oral Daily   polyethylene glycol  17 g Oral BID   predniSONE  50 mg Oral Q breakfast   senna-docusate  2 tablet Oral BID   Continuous Infusions:  azithromycin Stopped (03/01/22 2315)   ceFEPime (MAXIPIME) IV Stopped (03/01/22 2245)     LOS: 4 days    Time spent: 35 minutes    Irine Seal, MD [facility] Hospitalists   To contact the attending provider between 7A-7P or the covering provider during after hours 7P-7A, please log into the web site [email or web address] and access using universal [place] password for that web site. If you do not have the password, please call the hospital operator.  03/02/2022, 10:08 AM

## 2022-03-02 NOTE — Progress Notes (Addendum)
[facility] for heparin>> Lovenox Indication: pulmonary embolus  Allergies  Allergen Reactions   Lyrica [Pregabalin] Other (See Comments)    Hallucinations    Crestor [Rosuvastatin Calcium] Other (See Comments)    Leg cramps    Patient Measurements: Height: 4' 11.5" (151.1 cm) Weight: 53.5 kg (118 lb) IBW/kg (Calculated) : 44.35 Heparin Dosing Weight: 53.1 kg  Vital Signs: Temp: 97.6 F (36.4 C) (11/27 0420) Temp Source: Oral (11/27 0420) BP: 112/59 (11/27 0600) Pulse Rate: 85 (11/27 0600)  Labs: Recent Labs    02/28/22 0316 03/01/22 0500 03/02/22 0433  HGB 9.5* 9.6* 9.8*  HCT 30.0* 30.4* 31.7*  PLT 266 288 349  CREATININE 0.55 0.62 0.48     Estimated Creatinine Clearance: 48.9 mL/min (by C-G formula based on SCr of 0.48 mg/dL).   Medical History: Past Medical History:  Diagnosis Date   Arthritis    Cancer ([place])    Diabetes mellitus    Esophagitis    Goals of care, counseling/discussion 05/23/2021   Hiatal hernia    History of radiation therapy    Left Lung- 11/27/21-01/08/22- Dr. 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 Has history of stage IV lung cancer. CT scan shows right-sided pulmonary embolism. No history of AC PTA. Pharmacy has been consulted to transition from apixaban to enoxaparin by hematologist.    She was on heparin 11/22> 11/23 then on treatment dose apixaban.  Last dose apixaban 10 mg on 11/26 at 2158. Hg stable @ 9.8, PLT stable @ 349. SCr WNL  No bleeding  reported    Plan:  Lovenox 1 mg/kg sq q12h = 55 mg q12  F/u CBC & renal function  Eudelia Bunch, Pharm.D Use secure chat for questions 03/02/2022 7:46 AM

## 2022-03-02 NOTE — Progress Notes (Signed)
NAME:  [name], MRN:  [id number], DOB:  02-18-1951, LOS: 4 ADMISSION DATE:  02/25/2022, CONSULTATION DATE:  02/26/22 REFERRING MD:  Irine Seal, MD CHIEF COMPLAINT:  Acute hypoxemic respiratory failure   History of Present Illness:  71 year old female former smoker (50 pack-years, quit 2018) who presented to the ED and found with hypoxemia. PTA had 1 week URI associated with myalgias , chills, fever and urinary frequency and diarrhea. COVID neg at home. She continued to feel poorly and presented to urgent care with SpO2 89% so was advised to present to the ED. CTA demonstrated small segmental and segmental PE in RUL and patchy infiltrates bilaterally concerning for pneumonia. RVP and COVID/influenza neg. Started on supplemental oxygen, ceftriaxone and azithromycin and heparin. PCCM consulted.  She reports she is active at baseline with no limitations in activity. Followed by Dr. Lamonte Sakai for her diagnosis of lung cancer but did not need any bronchodilators. Last radiation treatment was in October 2023. No immediate issues post-procedure. URI symptoms one week ago as noted above. No wheezing or productive cough.  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 Hiatal hernia. Unchanged lymphadenopathy. Emphysema. Patchy GGO bilaterally. Prior bone mets seen. 11/25 Transferred to SDU for BiPAP for pulmonary edema  Interim History / Subjective:  No events. Wants better food.  Objective   Blood pressure (!) 112/59, pulse 85, temperature 97.6 F (36.4 C), temperature source Oral, resp. rate 20, height 4' 11.5"  (1.511 m), weight 53.5 kg, SpO2 91 %.    FiO2 (%):  [60 %-90 %] 80 %   Intake/Output Summary (Last 24 hours) at 03/02/2022 0740 Last data filed at 03/02/2022 0600 Gross per 24 hour  Intake 690 ml  Output 1500 ml  Net -810 ml    Filed Weights   02/28/22 1906 03/01/22 0500 03/02/22 0500  Weight: 52.2 kg 53.2 kg 53.5 kg   Physical Exam: No events eating breakfast Faint crackles bases + muscle wasting Port accessed Ext warm Moves ext to command Aox3  Labs good LFTs up slightly Some demargination of WBC from steroids  Resolved Hospital Problem list   N/A  Assessment & Plan:   Acute hypoxemic respiratory failure 2/2 multifocal pneumonia +/- subacute radiation pneumonitis, now acute pulmonary edema Emphysema, minimal/mild - no bronchodilators or O2 at baseline, current on scheduled nebs Subsegmental PE - on Eliquis. Heme/Onc following Hx metastatic small cell carcinoma of lung s/p chemoradiation - Last XRT 01/2022, last chemo 10/2021  - Lasix standing - Prednisone 50mg /day (1mg /kg) - Wean HHFNC for sats > 90% - Advance diet - Check Pct, tracheal aspirate if able; in absence of other data 5-7 days of abx is reasonable - I think she's fine for eliquis but defer to heme-onc - Will send FYI to [place] of hospitalization - Will follow  Best Practice (right click and "Reselect all SmartList Selections" daily)   Diet/type:  DVT prophylaxis: heparin GI prophylaxis: PPI Lines: N/A Foley:  N/A Code Status:  limited. OK for intubation. No chest compressions Last date of multidisciplinary goals of care discussion [ 11/26 by [place] care time: N/A    Erskine Emery MD [facility] 03/02/2022 7:40 AM   See Amion for personal pager For hours between 7 PM to 7 AM, please call Elink for urgent questions

## 2022-03-02 NOTE — Progress Notes (Signed)
PT not in distress. No BiPAP

## 2022-03-03 DIAGNOSIS — K5909 Other constipation: Secondary | ICD-10-CM | POA: Diagnosis not present

## 2022-03-03 DIAGNOSIS — J189 Pneumonia, unspecified organism: Secondary | ICD-10-CM | POA: Diagnosis not present

## 2022-03-03 DIAGNOSIS — J9601 Acute respiratory failure with hypoxia: Secondary | ICD-10-CM | POA: Diagnosis not present

## 2022-03-03 DIAGNOSIS — I2693 Single subsegmental pulmonary embolism without acute cor pulmonale: Secondary | ICD-10-CM | POA: Diagnosis not present

## 2022-03-03 LAB — COMPREHENSIVE METABOLIC PANEL
ALT: 65 U/L — ABNORMAL HIGH (ref 0–44)
AST: 48 U/L — ABNORMAL HIGH (ref 15–41)
Albumin: 2.8 g/dL — ABNORMAL LOW (ref 3.5–5.0)
Alkaline Phosphatase: 64 U/L (ref 38–126)
Anion gap: 10 (ref 5–15)
BUN: 30 mg/dL — ABNORMAL HIGH (ref 8–23)
CO2: 29 mmol/L (ref 22–32)
Calcium: 7.9 mg/dL — ABNORMAL LOW (ref 8.9–10.3)
Chloride: 102 mmol/L (ref 98–111)
Creatinine, Ser: 0.62 mg/dL (ref 0.44–1.00)
GFR, Estimated: >60 mL/min (ref >60)
Glucose, Bld: 70 mg/dL (ref 70–99)
Potassium: 3.5 mmol/L (ref 3.5–5.1)
Sodium: 141 mmol/L (ref 135–145)
Total Bilirubin: 0.6 mg/dL (ref 0.3–1.2)
Total Protein: 7.4 g/dL (ref 6.5–8.1)

## 2022-03-03 LAB — CBC WITH DIFFERENTIAL/PLATELET
Abs Immature Granulocytes: 0.19 10*3/uL — ABNORMAL HIGH (ref 0.00–0.07)
Basophils Absolute: 0 10*3/uL (ref 0.0–0.1)
Basophils Relative: 0 %
Eosinophils Absolute: 0.1 10*3/uL (ref 0.0–0.5)
Eosinophils Relative: 1 %
HCT: 34.6 % — ABNORMAL LOW (ref 36.0–46.0)
Hemoglobin: 10.6 g/dL — ABNORMAL LOW (ref 12.0–15.0)
Immature Granulocytes: 2 %
Lymphocytes Relative: 13 %
Lymphs Abs: 1.6 10*3/uL (ref 0.7–4.0)
MCH: 29 pg (ref 26.0–34.0)
MCHC: 30.6 g/dL (ref 30.0–36.0)
MCV: 94.8 fL (ref 80.0–100.0)
Monocytes Absolute: 1.3 10*3/uL — ABNORMAL HIGH (ref 0.1–1.0)
Monocytes Relative: 11 %
Neutro Abs: 9.3 10*3/uL — ABNORMAL HIGH (ref 1.7–7.7)
Neutrophils Relative %: 73 %
Platelets: 428 10*3/uL — ABNORMAL HIGH (ref 150–400)
RBC: 3.65 MIL/uL — ABNORMAL LOW (ref 3.87–5.11)
RDW: 13.4 % (ref 11.5–15.5)
WBC: 12.5 10*3/uL — ABNORMAL HIGH (ref 4.0–10.5)
nRBC: 0 % (ref 0.0–0.2)

## 2022-03-03 LAB — MAGNESIUM: Magnesium: 2 mg/dL (ref 1.7–2.4)

## 2022-03-03 LAB — GLUCOSE, CAPILLARY
Glucose-Capillary: 150 mg/dL — ABNORMAL HIGH (ref 70–99)
Glucose-Capillary: 210 mg/dL — ABNORMAL HIGH (ref 70–99)
Glucose-Capillary: 184 mg/dL — ABNORMAL HIGH (ref 70–99)
Glucose-Capillary: 302 mg/dL — ABNORMAL HIGH (ref 70–99)

## 2022-03-03 LAB — CULTURE, BLOOD (ROUTINE X 2)
Culture: NO GROWTH
Special Requests: ADEQUATE

## 2022-03-03 LAB — OSMOLALITY: Osmolality: 299 mOsm/kg — ABNORMAL HIGH (ref 275–295)

## 2022-03-03 LAB — LACTATE DEHYDROGENASE: LDH: 354 U/L — ABNORMAL HIGH (ref 98–192)

## 2022-03-03 LAB — PHOSPHORUS: Phosphorus: 2.6 mg/dL (ref 2.5–4.6)

## 2022-03-03 MED ORDER — SULFAMETHOXAZOLE-TRIMETHOPRIM 800-160 MG PO TABS
1.0000 | ORAL_TABLET | ORAL | Status: DC
  Administered 2022-03-04 – 2022-03-11 (×4): 1 via ORAL
  Filled 2022-03-03 (×6): qty 1

## 2022-03-03 MED ORDER — POTASSIUM CHLORIDE CRYS ER 20 MEQ PO TBCR
40.0000 meq | EXTENDED_RELEASE_TABLET | Freq: Two times a day (BID) | ORAL | Status: AC
  Administered 2022-03-03 (×2): 40 meq via ORAL
  Filled 2022-03-03 (×2): qty 2

## 2022-03-03 NOTE — Progress Notes (Signed)
Pt refused BiPAP qhs.  Pt prefers to sleep while wearing her Heated High-Flow Nasal Cannula.

## 2022-03-03 NOTE — Progress Notes (Signed)
NAME:  [name], MRN:  [id number], DOB:  1950/05/28, LOS: 5 ADMISSION DATE:  02/25/2022, CONSULTATION DATE:  02/26/22 REFERRING MD:  Irine Seal, MD CHIEF COMPLAINT:  Acute hypoxemic respiratory failure   History of Present Illness:  71 year old female former smoker (50 pack-years, quit 2018) who presented to the ED and found with hypoxemia. PTA had 1 week URI associated with myalgias , chills, fever and urinary frequency and diarrhea. COVID neg at home. She continued to feel poorly and presented to urgent care with SpO2 89% so was advised to present to the ED. CTA demonstrated small segmental and segmental PE in RUL and patchy infiltrates bilaterally concerning for pneumonia. RVP and COVID/influenza neg. Started on supplemental oxygen, ceftriaxone and azithromycin and heparin. PCCM consulted.  She reports she is active at baseline with no limitations in activity. Followed by Dr. Lamonte Sakai for her diagnosis of lung cancer but did not need any bronchodilators. Last radiation treatment was in October 2023. No immediate issues post-procedure. URI symptoms one week ago as noted above. No wheezing or productive cough.  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 Hiatal hernia. Unchanged lymphadenopathy. Emphysema. Patchy GGO bilaterally. Prior bone mets seen. 11/25 Transferred to SDU for BiPAP for pulmonary edema  Interim History / Subjective:  O2 needs a bit better. Remains on HHFNC, 55% 30L saturating 92%  Objective   Blood pressure 96/61, pulse 86, temperature 97.8 F (36.6 C), temperature source Oral,  resp. rate (!) 25, height 4' 11.5" (1.511 m), weight 53 kg, SpO2 95 %.    FiO2 (%):  [55 %-80 %] 55 %   Intake/Output Summary (Last 24 hours) at 03/03/2022 0949 Last data filed at 03/03/2022 0700 Gross per 24 hour  Intake 446.6 ml  Output 2350 ml  Net -1903.4 ml    Filed Weights   03/01/22 0500 03/02/22 0500 03/03/22 0500  Weight: 53.2 kg 53.5 kg 53 kg   Physical Exam: No events eating breakfast Faint crackles bases + muscle wasting Port accessed Ext warm Moves ext to command Aox3 All unchanged  Pulls 750 on IS  K to be repleted BUN creeping up with diuresis LFTs stable mild elevation CBC stable  Resolved Hospital Problem list   N/A  Assessment & Plan:   Acute hypoxemic respiratory failure 2/2 multifocal pneumonia +/- subacute radiation pneumonitis, now acute pulmonary edema Emphysema, minimal/mild - no bronchodilators or O2 at baseline, current on scheduled nebs Subsegmental PE - on Eliquis. Heme/Onc following Hx metastatic small cell carcinoma of lung s/p chemoradiation - Last XRT 01/2022, last chemo 10/2021  - Lasix holiday today, replete K - Prednisone 50mg /day (1mg /kg) - Start bactrim for PJP ppx - Wean HHFNC for sats > 90% - Adjust insulin for steroid-exacerbated hyperglycmiea - I think she's fine for eliquis but defer to heme-onc - Will follow  Best Practice (right click and "Reselect all SmartList Selections" daily)   Diet/type: diabetic DVT prophylaxis: heparin GI prophylaxis: PPI Lines: N/A Foley:  N/A Code Status:  DNR Last date of multidisciplinary goals of care discussion [ 11/26 by [place] care time: N/A    Erskine Emery MD [place]  03/03/2022 9:49 AM   See Amion for personal pager For hours between 7 PM to 7 AM, please call Elink for urgent questions

## 2022-03-03 NOTE — Progress Notes (Signed)
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  Patient noted prior to admission to have some chills, body aches, fever as high as 101.  Patient noted to have sats of 89% on room air and sent to the ED from urgent care center.  COVID-19 PCR negative, influenza a and B PCR negative, respiratory viral panel negative.  CT chest done with segmental PE, concern for pneumonia.  Patient placed empirically on IV antibiotics, anticoagulation.  PCCM consulted due to concerns for radiation pneumonitis.    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   -Likely multifactorial secondary to pneumonia in a patient with immunosuppression, concern for radiation pneumonitis and also noted to have segmental PE which is unlikely source of hypoxia.   Also concern for possible volume overload as patient noted to have worsening hypoxia on 02/28/2022 required BiPAP and subsequently transferred to the stepdown unit.   -Still with significant O2 requirements.  Currently on high flow nasal cannula  with a FiO2 of 80%, flow rate of 30.  Off BiPAP since yesterday 03/02/2022.  -COVID-19 PCR negative, influenza A and B negative, respiratory viral panel negative.   -CT angiogram chest with pulmonary emboli focal in the right upper lung distal segmental and subsegmental vessels, developing patchy airspace interstitial infiltrates most likely representing multifocal pneumonia, prominent mediastinal and abdominal lymph nodes likely metastatic, multiple bone mets.   -Patient with complaints of chills and fatigue early on in the hospitalization.   -Noted to have a Tmax of 101.6 (02/27/2022 ). -Fever curve trended down. -Urine strep pneumococcus antigen negative.  Urine Legionella antigen negative. -Due to Tmax of 101.6 antibiotic coverage was broadened to IV cefepime and IV azithromycin.   -IV Rocephin discontinued.   -Patient pancultured.  -Continue scheduled DuoNebs, Claritin, PPI, Pulmicort -Anticoagulation changed from Eliquis to Lovenox per hematology/oncology recommendations.  -PCCM consulted due to concerns for radiation pneumonitis and patient started on a prednisone taper. -Due to worsening hypoxia chest x-ray obtained concerning for possible volume overload and patient placed on IV Lasix. -Chest x-ray with slight improvement however due to ongoing hypoxia although prednisone changed to IV Solu-Medrol and dose increased per PCCM to 60 mg daily (03/01/2022) -IV Solu-Medrol changed to oral prednisone 50 mg daily per PCCM. -Patient has been changed from ICS/LABA to Pulmicort nebs, continue diuresis per PCCM and no significant improvement in O2 requirements may need repeat CT chest. -Patient with complaints of dizziness this morning, blood pressure soft, discontinue IV Lasix. -PCCM following.  Subsegmental pulmonary embolism without acute cor pulmonale (HCC) Small PE. Doubt this would cause cardiac issues.  -  Patient initially placed on IV heparin in the ED and has been transitioned to Eliquis.   -Patient on BiPAP and due to worsening respiratory status hematology/oncology recommending changing from Eliquis to IV heparin/Lovenox. -Patient transition to full dose Lovenox and will likely be discharged on full dose Lovenox per hematology/oncology recommendations..  -Hematology/oncology following.  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 Followed by oncology. Had 4 sessions of XRT in October 2023. Last session on 01-08-2022. Last received IV chemo July 2023. -Oncology following.  Type 2 diabetes mellitus without complication, with long-term current use of insulin (HCC) Stable.  -Hemoglobin A1c 11.3 (02/21/2021 ) -Repeat hemoglobin A1c 10.4 (02/26/2022). -CBG 70 on morning labs.  CBG noted to be in the 300s overnight..  -Elevated CBG secondary to steroids. -Continue Semglee to 34 units daily, meal coverage NovoLog 4 units 3 times daily with meals.   -SSI.    Hypothyroidism - Continue home regimen Synthroid 125 mcg daily.   Anemia -Patient with no overt bleeding. -Anemia likely secondary to acute illness and radiation treatment. -Hemoglobin stable at 10.6. -Follow H&H. -Transfusion threshold hemoglobin < 7  Hypophosphatemia -Repleted.   -Phosphorus at 2.6.    Hypomagnesemia -Repleted.   Magnesium at 2.0. -Repeat labs in AM.  Hypokalemia -Repleted, potassium of 3.5. -Magnesium at 2.1. -Repeat labs in AM.  Other constipation -Continue current bowel regimen of Senokot-S twice daily, MiraLAX twice daily, Dulcolax suppository daily.   Hyponatremia - TSH 2.597.  -Free T4  1.28.   -Urine sodium 142, urine potassium 27,  -Concern hyponatremia may be secondary to small cell lung cancer.  -Sodium levels improved currently at 141 this morning.  -Repeat labs in the AM.          DVT prophylaxis: Eliquis>>> heparin Code Status: Full Family Communication: Updated patient.  No family at bedside. Disposition: TBD.   Currently in stepdown unit.  Status is: Inpatient Remains inpatient appropriate because: Severity of illness   Consultants:  PCCM: Dr. Loanne Drilling 02/26/2022 Oncology: Dr. Alvy Bimler 02/26/2022/Dr. Marin Olp  Procedures:  Chest x-ray 02/25/2022, 02/28/2022, 03/01/2022 CT angiogram chest 02/25/2022   Antimicrobials:  IV azithromycin 02/25/2022>>>> 03/02/2022 IV Rocephin 02/25/2022>>>> 02/27/2022 IV cefepime 02/27/2022   Subjective: Sitting up in bed watching the news.  Complaining of some dizziness/lightheadedness.  Feels breathing is improving overall.  Did not require BiPAP overnight on high flow nasal cannula.  Denies any chest pain.  No abdominal pain.   Objective: Vitals:   03/03/22 0600 03/03/22 0800 03/03/22 0840 03/03/22 0903  BP: 96/61     Pulse: 86     Resp: (!) 25     Temp:  97.8 F (36.6 C)    TempSrc:  Oral    SpO2: 97%  97% 95%  Weight:      Height:        Intake/Output Summary (Last 24 hours) at 03/03/2022 0935 Last data filed at 03/03/2022 0700 Gross per 24 hour  Intake 446.6 ml  Output 2350 ml  Net -1903.4 ml   Filed Weights   03/01/22 0500 03/02/22 0500 03/03/22 0500  Weight: 53.2 kg 53.5 kg 53 kg    Examination:  General exam: NAD.  Port-A-Cath right upper chest wall with no surrounding erythema, nontender to palpation.  Respiratory system: Bibasilar fine crackles noted.  No rhonchi.  No wheezing.  Fair air movement.  On high flow nasal cannula FiO2 80% with a flow rate of 30.  Cardiovascular system: RRR no murmurs rubs or gallops.  No JVD.  No lower extremity edema.   Gastrointestinal system: Abdomen is soft, nontender, nondistended, positive bowel sounds.  No rebound.  No guarding. Central nervous system: Alert and oriented. No focal neurological deficits. Extremities: Symmetric 5 x 5 power. Skin: No rashes, lesions or ulcers Psychiatry: Judgement and insight appear normal. Mood & affect appropriate.     Data Reviewed:   CBC: Recent Labs   Lab 02/27/22 1749 02/28/22 0316 03/01/22 0500 03/02/22 0433 03/03/22 0625  WBC 11.0* 10.5 13.5* 13.2* 12.5*  NEUTROABS 10.4* 8.9* 11.6* 11.6* 9.3*  HGB 10.3* 9.5* 9.6* 9.8* 10.6*  HCT 31.8* 30.0* 30.4* 31.7* 34.6*  MCV 91.1 91.7 92.1 93.0 94.8  PLT 259 266 288 349 428*    Basic Metabolic Panel: Recent Labs  Lab 02/26/22 1155 02/27/22 0316 02/28/22 0316 03/01/22 0500 03/02/22 0433 03/03/22 0625  NA 134* 133* 135 138 139 141  K 3.4* 4.8 4.4 4.3 4.6 3.5  CL 102 100 103 101 102 102  CO2 26 25 25 28 28 29   GLUCOSE 236* 283* 184* 185* 152* 70  BUN 11 7* 15 26* 29* 30*  CREATININE 0.54 0.52 0.55 0.62 0.48 0.62  CALCIUM 8.3* 7.7* 8.0* 8.0* 7.8* 7.9*  MG 1.5* 2.5*  --  2.1  --  2.0  PHOS 1.7* 3.2  --   --   --  2.6    GFR: Estimated Creatinine Clearance: 45.2 mL/min (by C-G formula based on SCr of 0.62 mg/dL).  Liver Function Tests: Recent Labs  Lab 02/25/22 1656 02/26/22 1155 02/27/22 0316 02/28/22 0316 03/01/22 0500 03/02/22 0433 03/03/22 0625  AST 21  --   --  25 48* 57* 48*  ALT 20  --   --  20 39 56* 65*  ALKPHOS 64  --   --  52 59 58 64  BILITOT 0.7  --   --  0.7 0.6 0.6 0.6  PROT 8.0  --   --  6.9 7.4 7.2 7.4  ALBUMIN 3.2*   < > 2.8* 2.7* 2.7* 2.7* 2.8*   < > = values in this interval not displayed.    CBG: Recent Labs  Lab 03/01/22 2150 03/02/22 0756 03/02/22 1206 03/02/22 1553 03/02/22 2119  GLUCAP 186* 146* 79 378* 315*     Recent Results (from the past 240 hour(s))  Resp Panel by RT-PCR (Flu A&B, Covid) Anterior Nasal Swab     Status: None   Collection Time: 02/25/22  4:33 PM   Specimen: Anterior Nasal Swab  Result Value Ref Range Status   SARS Coronavirus 2 by RT PCR NEGATIVE NEGATIVE Final    Comment: (NOTE) SARS-CoV-2 target nucleic acids are NOT DETECTED.  The SARS-CoV-2 RNA is generally detectable in upper respiratory specimens during the acute phase of infection. The lowest concentration of SARS-CoV-2 viral copies this assay  can detect is 138 copies/mL. A negative result does not preclude SARS-Cov-2 infection and should not be used as the sole basis for treatment or other patient management decisions. A negative result may occur with  improper specimen collection/handling, submission of specimen other than nasopharyngeal swab, presence of viral mutation(s) within the areas targeted by this assay, and inadequate number of viral copies(<138 copies/mL). A negative result must be combined with clinical observations, patient history, and epidemiological information. The expected result is Negative.  Fact Sheet for Patients:  EntrepreneurPulse.com.au  Fact Sheet for Healthcare Providers:  IncredibleEmployment.be  This test is no t yet approved or cleared by the [place] and  has been authorized for detection and/or diagnosis of SARS-CoV-2 by FDA under an Emergency Use Authorization (EUA). This EUA will remain  in effect (meaning this test can be used) for the duration of the COVID-19 declaration under Section 564(b)(1) of the Act, 21 U.S.C.section 360bbb-3(b)(1), unless the authorization is terminated  or revoked sooner.       Influenza A by PCR NEGATIVE NEGATIVE Final   Influenza B by PCR NEGATIVE NEGATIVE Final    Comment: (NOTE) The Xpert Xpress SARS-CoV-2/FLU/RSV plus assay is intended as an aid in the diagnosis of influenza from Nasopharyngeal swab specimens and should not be used as a sole basis for treatment. Nasal washings and aspirates are unacceptable for Xpert Xpress SARS-CoV-2/FLU/RSV testing.  Fact Sheet for Patients: EntrepreneurPulse.com.au  Fact Sheet for Healthcare Providers: IncredibleEmployment.be  This test is not yet approved or cleared by the [place] FDA and has been authorized for detection and/or diagnosis of SARS-CoV-2 by FDA under an Emergency Use Authorization (EUA). This EUA will  remain in effect (meaning this test can be used) for the duration of the COVID-19 declaration under Section 564(b)(1) of the Act, 21 U.S.C. section 360bbb-3(b)(1), unless the authorization is terminated or revoked.  Performed at [facility], [address] [place], [place] [address]   Culture, blood (Routine X 2) w Reflex to ID Panel     Status: None   Collection Time: 02/25/22  4:57 PM   Specimen: BLOOD  Result Value Ref Range Status   Specimen Description   Final    BLOOD BOTTLES DRAWN AEROBIC AND ANAEROBIC LEFT ANTECUBITAL Performed at [facility], [address] [place], [place] [address]    Special Requests   Final    Blood Culture adequate volume Performed at [facility], [address] [place], [place] [address]    Culture   Final    NO GROWTH 5 DAYS Performed at [facility], [place] [address], [place], [place] [address]    Report Status 03/02/2022 FINAL  Final  Respiratory (~20 pathogens) panel by PCR     Status: None   Collection Time: 02/25/22  9:09 PM   Specimen: Nasopharyngeal Swab; Respiratory  Result Value Ref Range Status   Adenovirus NOT DETECTED NOT DETECTED Final   Coronavirus 229E NOT DETECTED NOT DETECTED Final    Comment: (NOTE) The Coronavirus on the Respiratory Panel, DOES NOT test for the novel  Coronavirus (2019 nCoV)    Coronavirus HKU1 NOT DETECTED NOT DETECTED Final   Coronavirus NL63 NOT DETECTED NOT DETECTED Final   Coronavirus OC43 NOT DETECTED NOT DETECTED Final   Metapneumovirus NOT DETECTED NOT DETECTED Final   Rhinovirus / Enterovirus NOT DETECTED NOT DETECTED Final   Influenza A NOT DETECTED NOT DETECTED Final   Influenza B NOT DETECTED NOT DETECTED Final   Parainfluenza Virus 1 NOT DETECTED NOT DETECTED Final   Parainfluenza Virus 2 NOT DETECTED NOT DETECTED Final   Parainfluenza Virus 3 NOT DETECTED NOT DETECTED Final   Parainfluenza Virus 4 NOT DETECTED NOT DETECTED Final   Respiratory  Syncytial Virus NOT DETECTED NOT DETECTED Final   Bordetella pertussis NOT DETECTED NOT DETECTED Final   Bordetella Parapertussis NOT DETECTED NOT DETECTED Final   Chlamydophila pneumoniae NOT DETECTED NOT DETECTED Final   Mycoplasma pneumoniae NOT DETECTED NOT DETECTED Final    Comment: Performed at [facility], [place] [address] [place], [place] [address]  Culture, blood (Routine X 2) w  Reflex to ID Panel     Status: None   Collection Time: 02/26/22 10:27 AM   Specimen: BLOOD  Result Value Ref Range Status   Specimen Description   Final    BLOOD BLOOD LEFT HAND Performed at [facility] [address] [place], [place] [address]    Special Requests   Final    BOTTLES DRAWN AEROBIC AND ANAEROBIC Blood Culture adequate volume Performed at [facility] [address], [place], [place] [address]    Culture   Final    NO GROWTH 5 DAYS Performed at [facility], [place] [address] [place], [place] [address]    Report Status 03/03/2022 FINAL  Final  Urine Culture     Status: None   Collection Time: 02/27/22  8:31 AM   Specimen: Urine, Clean Catch  Result Value Ref Range Status   Specimen Description   Final    URINE, CLEAN CATCH Performed at [facility], [place] [address], [place], [place] [address]    Special Requests   Final    NONE Performed at [facility], [place] [address], [place], [place] [address]    Culture   Final    NO GROWTH Performed at [facility], [place] [address], [place], [place] [address]    Report Status 02/28/2022 FINAL  Final  MRSA Next Gen by PCR, Nasal     Status: None   Collection Time: 02/28/22  7:13 PM   Specimen: Nasal Mucosa; Nasal Swab  Result Value Ref Range Status   MRSA by PCR Next Gen NOT DETECTED NOT DETECTED Final    Comment: (NOTE) The GeneXpert MRSA Assay (FDA approved for NASAL specimens only), is one component of a comprehensive MRSA colonization  surveillance program. It is not intended to diagnose MRSA infection nor to guide or monitor treatment for MRSA infections. Test performance is not FDA approved in patients less than 4 years old. Performed at [facility], [place] [address] [place],  [address]          Radiology Studies: DG CHEST PORT 1 VIEW  Result Date: 03/02/2022 CLINICAL DATA:  Acute respiratory failure with hypoxia. EXAM: PORTABLE CHEST 1 VIEW COMPARISON:  AP chest 03/01/2022, chest two views 02/28/2022, AP chest 02/25/2022, chest two views 07/25/2021;, PET-CT 11/03/2021 FINDINGS: Right chest wall porta catheter tip again overlies the superior aspect of the right atrium. Cardiac silhouette is again at the upper limits of normal size. Mediastinal contours are within normal limits. Left mid and lower lung and right lower lung interstitial thickening is similar to multiple prior radiographs from 03/01/2022 through 02/25/2022. Mildly increased hazy opacification of the left mid and lower lung and right lower lung compared to 07/25/2020. No definite pleural effusion. No pneumothorax. Minimal dextrocurvature of the midthoracic spine. IMPRESSION: Mildly increased hazy opacification of the left mid and lower lung and right lower lung compared to 07/25/2020 but unchanged from more recent November 2023 radiographs over the course of this past week. Has this patient undergone radiation therapy for left hilar lymphadenopathy seen on 11/03/2021 PET-CT? If so, some of the lung findings may represent postradiation change. Otherwise, differential considerations include chronic interstitial thickening with superimposed pulmonary edema versus multifocal infection within the left-greater-than-right lungs. Electronically Signed   By: Yvonne Kendall M.D.   On: 03/02/2022 08:34        Scheduled Meds:  bisacodyl  10 mg Rectal Daily   budesonide (PULMICORT) nebulizer solution  0.25 mg Nebulization BID   Chlorhexidine  Gluconate Cloth  6 each Topical Daily   enoxaparin (LOVENOX) injection  55 mg Subcutaneous BID   FLUoxetine  20 mg Oral Daily   fluticasone  2 spray Each Nare Daily   insulin aspart  0-15 Units Subcutaneous TID WC   insulin aspart  0-5 Units Subcutaneous QHS   insulin aspart  4 Units Subcutaneous TID WC   insulin glargine-yfgn  34 Units Subcutaneous QHS   ipratropium-albuterol  3 mL Nebulization TID   levothyroxine  125 mcg Oral Q0600   loratadine  10 mg Oral Daily   mouth rinse  15 mL Mouth Rinse 4 times per day   oxyCODONE  10 mg Oral Q12H   pantoprazole  40 mg Oral Daily   polyethylene glycol  17 g Oral BID   predniSONE  50 mg Oral Q breakfast   senna-docusate  2 tablet Oral BID   Continuous Infusions:  ceFEPime (MAXIPIME) IV Stopped (03/02/22 2141)     LOS: 5 days    Time spent: 35 minutes    Irine Seal, MD [facility]   To contact the attending provider between 7A-7P or the covering provider during after hours 7P-7A, please log into the web site [email or web address] and access using universal [place] password for that web site. If you do not have the password, please call the hospital operator.  03/03/2022, 9:35 AM

## 2022-03-03 NOTE — Progress Notes (Signed)
Pt did not want to use the BiPAP. She wanted to continue using the [place]. PT not in any distress.

## 2022-03-03 NOTE — Progress Notes (Signed)
[name] feels a little bit better.  Her oxygen requirements  are decreased a little bit.  She had a chest x-ray yesterday.  The the x-ray may have showed a little bit of haziness over on the left side.  Again this may be from radiation therapy.  She is now on Lovenox.  I think with the pulmonary embolus in the setting of malignancy, we probably should have her on Lovenox.  She is eating well.  Her husband was with her this morning.  He confirms that she is eating well.  She is going need a lot of physical therapy I think.  She really has not been out of bed.  There are no labs back yet.  I know that she had slightly elevated LFTs yesterday.  She is not having any diarrhea.  She had a large bowel movement yesterday.  She does not have any pain.  She is having no nausea.  She is on diuretic therapy.  She had very good urine output.  Her vital signs show temperature of 98.1.  Pulse 86.  Blood pressure 96/61.  Oxygen saturation is 97%.  Her lungs sound clear bilaterally.  I do not hear much wheezing.  Cardiac exam regular rate and rhythm.  Abdomen is soft.  Bowel sounds are present.  Extremity shows no clubbing, cyanosis or edema.  [name] has small cell lung cancer.  She has had chemoimmunotherapy and radiation therapy.  She is supposed to have a PET scan tomorrow to see how everything has improved.  Unfortunately, she is in the hospital so the PET scan can be done.  I think that nutritions can be critical for her.  I think the better that she eats the stronger that she will get.  She is going to need quite a lot of physical therapy.  She is on steroids for the possible radiation pneumonitis.  She does have high blood sugars.  I know that she is getting incredible care from the wonderful staff down in the ICU.  I do appreciate all of their compassion.  [name], MD  2 Cor:9:7

## 2022-03-04 ENCOUNTER — Inpatient Hospital Stay (HOSPITAL_COMMUNITY)
Admission: RE | Admit: 2022-03-04 | Discharge: 2022-03-04 | Disposition: A | Discharge status: Home / Self Care | Payer: PPO | Source: Ambulatory Visit | Attending: Family | Admitting: Family

## 2022-03-04 ENCOUNTER — Inpatient Hospital Stay (HOSPITAL_COMMUNITY): Payer: PPO

## 2022-03-04 ENCOUNTER — Encounter: Payer: Self-pay | Admitting: *Deleted

## 2022-03-04 DIAGNOSIS — I2693 Single subsegmental pulmonary embolism without acute cor pulmonale: Secondary | ICD-10-CM | POA: Diagnosis not present

## 2022-03-04 DIAGNOSIS — E039 Hypothyroidism, unspecified: Secondary | ICD-10-CM | POA: Diagnosis not present

## 2022-03-04 DIAGNOSIS — C3490 Malignant neoplasm of unspecified part of unspecified bronchus or lung: Secondary | ICD-10-CM | POA: Diagnosis not present

## 2022-03-04 DIAGNOSIS — J9601 Acute respiratory failure with hypoxia: Secondary | ICD-10-CM | POA: Diagnosis not present

## 2022-03-04 LAB — PHOSPHORUS: Phosphorus: 2.2 mg/dL — ABNORMAL LOW (ref 2.5–4.6)

## 2022-03-04 LAB — COMPREHENSIVE METABOLIC PANEL
ALT: 65 U/L — ABNORMAL HIGH (ref 0–44)
AST: 42 U/L — ABNORMAL HIGH (ref 15–41)
Albumin: 2.6 g/dL — ABNORMAL LOW (ref 3.5–5.0)
Alkaline Phosphatase: 64 U/L (ref 38–126)
Anion gap: 6 (ref 5–15)
BUN: 22 mg/dL (ref 8–23)
CO2: 29 mmol/L (ref 22–32)
Calcium: 8.5 mg/dL — ABNORMAL LOW (ref 8.9–10.3)
Chloride: 107 mmol/L (ref 98–111)
Creatinine, Ser: 0.47 mg/dL (ref 0.44–1.00)
GFR, Estimated: >60 mL/min (ref >60)
Glucose, Bld: 63 mg/dL — ABNORMAL LOW (ref 70–99)
Potassium: 4 mmol/L (ref 3.5–5.1)
Sodium: 142 mmol/L (ref 135–145)
Total Bilirubin: 0.6 mg/dL (ref 0.3–1.2)
Total Protein: 7.2 g/dL (ref 6.5–8.1)

## 2022-03-04 LAB — GLUCOSE, CAPILLARY
Glucose-Capillary: 47 mg/dL — ABNORMAL LOW (ref 70–99)
Glucose-Capillary: 49 mg/dL — ABNORMAL LOW (ref 70–99)
Glucose-Capillary: 75 mg/dL (ref 70–99)
Glucose-Capillary: 250 mg/dL — ABNORMAL HIGH (ref 70–99)
Glucose-Capillary: 369 mg/dL — ABNORMAL HIGH (ref 70–99)
Glucose-Capillary: 273 mg/dL — ABNORMAL HIGH (ref 70–99)

## 2022-03-04 LAB — PROCALCITONIN: Procalcitonin: <0.1 ng/mL

## 2022-03-04 LAB — CBC WITH DIFFERENTIAL/PLATELET
Abs Immature Granulocytes: 0.16 10*3/uL — ABNORMAL HIGH (ref 0.00–0.07)
Basophils Absolute: 0 10*3/uL (ref 0.0–0.1)
Basophils Relative: 0 %
Eosinophils Absolute: 0.1 10*3/uL (ref 0.0–0.5)
Eosinophils Relative: 1 %
HCT: 34 % — ABNORMAL LOW (ref 36.0–46.0)
Hemoglobin: 10.4 g/dL — ABNORMAL LOW (ref 12.0–15.0)
Immature Granulocytes: 1 %
Lymphocytes Relative: 13 %
Lymphs Abs: 1.7 10*3/uL (ref 0.7–4.0)
MCH: 29.1 pg (ref 26.0–34.0)
MCHC: 30.6 g/dL (ref 30.0–36.0)
MCV: 95.2 fL (ref 80.0–100.0)
Monocytes Absolute: 1.3 10*3/uL — ABNORMAL HIGH (ref 0.1–1.0)
Monocytes Relative: 10 %
Neutro Abs: 10.1 10*3/uL — ABNORMAL HIGH (ref 1.7–7.7)
Neutrophils Relative %: 75 %
Platelets: 440 10*3/uL — ABNORMAL HIGH (ref 150–400)
RBC: 3.57 MIL/uL — ABNORMAL LOW (ref 3.87–5.11)
RDW: 13.6 % (ref 11.5–15.5)
WBC: 13.4 10*3/uL — ABNORMAL HIGH (ref 4.0–10.5)
nRBC: 0 % (ref 0.0–0.2)

## 2022-03-04 LAB — MAGNESIUM: Magnesium: 2.4 mg/dL (ref 1.7–2.4)

## 2022-03-04 MED ORDER — INSULIN GLARGINE-YFGN 100 UNIT/ML ~~LOC~~ SOLN
25.0000 [IU] | Freq: Every day | SUBCUTANEOUS | Status: DC
  Administered 2022-03-04: 25 [IU] via SUBCUTANEOUS
  Filled 2022-03-04 (×2): qty 0.25

## 2022-03-04 MED ORDER — FUROSEMIDE 10 MG/ML IJ SOLN
40.0000 mg | Freq: Two times a day (BID) | INTRAMUSCULAR | Status: DC
  Administered 2022-03-04 – 2022-03-07 (×7): 40 mg via INTRAVENOUS
  Filled 2022-03-04 (×7): qty 4

## 2022-03-04 MED ORDER — SODIUM PHOSPHATES 45 MMOLE/15ML IV SOLN
15.0000 mmol | Freq: Once | INTRAVENOUS | Status: AC
  Administered 2022-03-04: 15 mmol via INTRAVENOUS
  Filled 2022-03-04: qty 5

## 2022-03-04 NOTE — Progress Notes (Signed)
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  Patient noted prior to admission to have some chills, body aches, fever as high as 101.  Patient noted to have sats of 89% on room air and sent to the ED from urgent care center.  COVID-19 PCR negative, influenza a and B PCR negative, respiratory viral panel negative.  CT chest done with segmental PE, concern for pneumonia.  Patient placed empirically on IV antibiotics, anticoagulation.  PCCM consulted due to concerns for radiation pneumonitis.   **Interim History PCCM/pulmonary has been consulted and recommending physical therapy and checking procalcitonin.  They are recommending resuming Lasix and continue Bactrim for PJP prophylaxis and recommending a slow steroid taper and wean and treating inflammation, PE fluid as well deconditioning  Assessment and Plan: * Acute respiratory failure with hypoxia [facility]) -Patient admitted as patient had presented acute respiratory failure with hypoxia.   -Likely multifactorial secondary to pneumonia in a patient with immunosuppression, concern for radiation pneumonitis and also noted to have segmental PE which is unlikely source of hypoxia.   Also concern for possible volume overload as patient noted to have worsening hypoxia on 02/28/2022 required BiPAP and subsequently transferred to the stepdown unit.   -Still with significant O2 requirements.  Currently on high flow nasal cannula with a FiO2 of 80%, flow rate of 30.  Off BiPAP since yesterday 03/02/2022.  -SpO2: 96 % O2 Flow Rate (L/min): 30 L/min FiO2 (%): 50 % -COVID-19 PCR negative, influenza A and B negative, respiratory viral panel negative.   -CT  angiogram chest with pulmonary emboli focal in the right upper lung distal segmental and subsegmental vessels, developing patchy airspace interstitial infiltrates most likely representing multifocal pneumonia, prominent mediastinal and abdominal lymph nodes likely metastatic, multiple bone mets.   -Patient with complaints of chills and fatigue early on in the hospitalization.   -Noted to have a Tmax of 101.6 (02/27/2022). -Fever curve trended down and has been afebrile in the last 2 days. -Urine strep pneumococcus antigen negative.  Urine Legionella antigen negative. -Due to Tmax of 101.6 antibiotic coverage was broadened to IV cefepime and IV azithromycin.   -IV Rocephin discontinued.   -Patient pancultured.  -Continue scheduled DuoNebs, Claritin, PPI, Pulmicort -Anticoagulation changed from Eliquis to Lovenox per hematology/oncology recommendations.  -PCCM consulted due to concerns for radiation pneumonitis and patient started on a prednisone taper. -Due to worsening hypoxia chest x-ray obtained concerning for possible volume overload and patient placed on IV Lasix and had been held due to her dizziness blood pressures but now will be continued and getting IV 40 mg twice daily x 2 doses -Chest x-ray with slight improvement however due to ongoing hypoxia although prednisone changed to IV Solu-Medrol and dose increased per PCCM to 60 mg daily (03/01/2022) and now IV Solu-Medrol changed to oral prednisone 50 mg daily per PCCM on 03/02/2022. -Patient has been changed from ICS/LABA to Pulmicort nebs, continue diuresis per PCCM and no significant improvement in O2 requirements may need repeat CT chest. -PCT is <0.10 -Strict I's and O's and Daily Weights  -She is on Bactrim for PJP prophylaxis -Continue with DuoNeb 3 MLS 3 times daily as well as budesonide -PCCM following and appreciate further  evaluation.   Subsegmental pulmonary embolism without acute cor pulmonale (HCC) -Small PE. Doubt this would  cause cardiac issues.  -Patient initially placed on IV heparin in the ED and has been transitioned to Eliquis. -Patient on BiPAP and due to worsening respiratory status hematology/oncology recommending changing from Eliquis to IV heparin/Lovenox. -Patient transition to full dose Lovenox and will likely be discharged on full dose Lovenox per hematology/oncology recommendations..  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  -Followed by oncology. Had 4 sessions of XRT in October 2023. Last session on 01-08-2022.  -Last received IV chemo July 2023. -Oncology following.   Uncontrolled Type 2 diabetes mellitus without complication, with long-term current use of insulin (HCC) -Stable.  -Hemoglobin A1c 11.3 (02/21/2021) -Repeat hemoglobin A1c 10.4 (02/26/2022). -CBG 70 on morning labs.  CBG noted to be in the 300s overnight..  -Elevated CBG secondary to steroids. -Continued Semglee to 34 units daily but will reduce to 25 units given hypoglycemia this AM, meal coverage NovoLog 4 units 3 times daily with meals.   -C/w Moderate Novolog SSI.  -CBGs ranging from 47-369  Hypothyroidism -Continue home regimen Synthroid 125 mcg daily.    Normocytic Anemia -Patient with no overt bleeding. -Anemia likely secondary to acute illness and radiation treatment. -Hemoglobin stable at 10.6. -Follow H&H. -Transfusion threshold hemoglobin < 7   Hypophosphatemia -Replete via Pharmacy Protocol -Phosphorus at 2.2     Hypomagnesemia -Magnesium at 2.4. -Repeat labs in AM.   Hypokalemia -Repleted, potassium of 3.5. -Magnesium at 2.1. -Repeat labs in AM.   Other constipation -Continue current bowel regimen of Senokot-S twice daily, MiraLAX twice daily, Dulcolax suppository daily.    Hyponatremia, improved  - TSH 2.597.  -Free T4  1.28.   -Urine sodium 142, urine potassium 27,   -Concern hyponatremia may be secondary to small cell lung cancer.  -Sodium levels improved currently at 142 this morning.  -Repeat labs in the AM.    Abnormal LFTs -Patient's AST went from 48 is now 42 and ALT is 65 again -Continue to Monitor and trend and repeat CMP in the a.m. and if necessary will obtain a right upper quadrant ultrasound as well as an acute hepatitis panel  DVT prophylaxis: SCDs Start: 02/26/22 0404    Code Status: DNR Family Communication: Discussed with family at bedside  Disposition Plan:  Level of care: Stepdown Status is: Inpatient Remains inpatient appropriate because: Remains on quite a bit of supplemental oxygen will need further clinical improvement and clearance by pulmonary prior to safe discharge disposition   Consultants:  Pulmonary/PCCM Medical Oncology  Procedures:  As delineated as above  Antimicrobials:  Anti-infectives (From admission, onward)    Start     Dose/Rate Route Frequency Ordered Stop   03/04/22 0900  sulfamethoxazole-trimethoprim (BACTRIM DS) 800-160 MG per tablet 1 tablet        1 tablet Oral Once per day on Mon Wed Fri 03/03/22 0952     03/01/22 2200  azithromycin (ZITHROMAX) 500 mg in sodium chloride 0.9 % 250 mL IVPB        500 mg 250 mL/hr over 60 Minutes Intravenous Every 24 hours 03/01/22 0940 03/02/22 2320   02/28/22 2200  azithromycin (ZITHROMAX) tablet 500 mg  Status:  Discontinued        500 mg Oral Daily at bedtime 02/28/22 1154 03/01/22 0940   02/27/22 1000  ceFEPIme (MAXIPIME) 2 g in sodium  chloride 0.9 % 100 mL IVPB  Status:  Discontinued        2 g 200 mL/hr over 30 Minutes Intravenous Every 12 hours 02/27/22 0856 03/03/22 0951   02/26/22 2200  azithromycin (ZITHROMAX) 500 mg in sodium chloride 0.9 % 250 mL IVPB  Status:  Discontinued        500 mg 250 mL/hr over 60 Minutes Intravenous Every 24 hours 02/26/22 0900 02/28/22 1153   02/26/22 1800  cefTRIAXone (ROCEPHIN) 2 g in sodium chloride 0.9 % 100 mL IVPB   Status:  Discontinued        2 g 200 mL/hr over 30 Minutes Intravenous Every 24 hours 02/26/22 0900 02/27/22 0838   02/25/22 2015  cefTRIAXone (ROCEPHIN) 1 g in sodium chloride 0.9 % 100 mL IVPB        1 g 200 mL/hr over 30 Minutes Intravenous  Once 02/25/22 2011 02/25/22 2059   02/25/22 2015  azithromycin (ZITHROMAX) 500 mg in sodium chloride 0.9 % 250 mL IVPB        500 mg 250 mL/hr over 60 Minutes Intravenous  Once 02/25/22 2011 02/25/22 2206       Subjective: Seen and examined at bedside remains on quite a bit of supplemental oxygen still and was on heated high flow nasal cannula.  She denied any chest pain but felt okay.  Starting to get diuresis again.  No nausea or vomiting.  Denies any other concerns or complaints this time.  Objective: Vitals:   03/04/22 0340 03/04/22 0400 03/04/22 0600 03/04/22 0700  BP:  126/75 121/64   Pulse:  80 88 96  Resp:  16  16  Temp: 98 F (36.7 C)     TempSrc: Axillary     SpO2:  92% 96% (!) 89%  Weight:      Height:        Intake/Output Summary (Last 24 hours) at 03/04/2022 0843 Last data filed at 03/04/2022 0600 Gross per 24 hour  Intake 240 ml  Output 500 ml  Net -260 ml   Filed Weights   03/01/22 0500 03/02/22 0500 03/03/22 0500  Weight: 53.2 kg 53.5 kg 53 kg   Examination: Physical Exam:  Constitutional: WN/WD chronically ill-appearing Caucasian female currently no acute distress Respiratory: Diminished to auscultation bilaterally with coarse breath sounds and wearing heated high flow nasal cannula and has some slight crackles and rhonchi.  No appreciable wheezing or rales. Normal respiratory effort and patient is not tachypenic. No accessory muscle use.  Cardiovascular: RRR, has a 2 out of 6 systolic murmur.  Abdomen extremity edema Abdomen: Soft, non-tender, non-distended. Bowel sounds positive.  GU: Deferred. Musculoskeletal: No clubbing / cyanosis of digits/nails. No joint deformity upper and lower extremities.  Skin: No  rashes, lesions, ulcers on limited skin evaluation. No induration; Warm and dry.  Neurologic: CN 2-12 grossly intact with no focal deficits. Romberg sign and cerebellar reflexes not assessed.  Psychiatric: Normal judgment and insight. Alert and oriented x 3. Normal mood and appropriate affect.   Data Reviewed: I have personally reviewed following labs and imaging studies  CBC: Recent Labs  Lab 02/28/22 0316 03/01/22 0500 03/02/22 0433 03/03/22 0625 03/04/22 0558  WBC 10.5 13.5* 13.2* 12.5* 13.4*  NEUTROABS 8.9* 11.6* 11.6* 9.3* 10.1*  HGB 9.5* 9.6* 9.8* 10.6* 10.4*  HCT 30.0* 30.4* 31.7* 34.6* 34.0*  MCV 91.7 92.1 93.0 94.8 95.2  PLT 266 288 349 428* 322*   Basic Metabolic Panel: Recent Labs  Lab 02/26/22 1155 02/27/22 0316  02/28/22 0316 03/01/22 0500 03/02/22 0433 03/03/22 0625 03/04/22 0558  NA 134* 133* 135 138 139 141 142  K 3.4* 4.8 4.4 4.3 4.6 3.5 4.0  CL 102 100 103 101 102 102 107  CO2 26 25 25 28 28 29 29   GLUCOSE 236* 283* 184* 185* 152* 70 63*  BUN 11 7* 15 26* 29* 30* 22  CREATININE 0.54 0.52 0.55 0.62 0.48 0.62 0.47  CALCIUM 8.3* 7.7* 8.0* 8.0* 7.8* 7.9* 8.5*  MG 1.5* 2.5*  --  2.1  --  2.0 2.4  PHOS 1.7* 3.2  --   --   --  2.6 2.2*   GFR: Estimated Creatinine Clearance: 45.2 mL/min (by C-G formula based on SCr of 0.47 mg/dL). Liver Function Tests: Recent Labs  Lab 02/28/22 0316 03/01/22 0500 03/02/22 0433 03/03/22 0625 03/04/22 0558  AST 25 48* 57* 48* 42*  ALT 20 39 56* 65* 65*  ALKPHOS 52 59 58 64 64  BILITOT 0.7 0.6 0.6 0.6 0.6  PROT 6.9 7.4 7.2 7.4 7.2  ALBUMIN 2.7* 2.7* 2.7* 2.8* 2.6*   No results for input(s): "LIPASE", "AMYLASE" in the last 168 hours. No results for input(s): "AMMONIA" in the last 168 hours. Coagulation Profile: No results for input(s): "INR", "PROTIME" in the last 168 hours. Cardiac Enzymes: No results for input(s): "CKTOTAL", "CKMB", "CKMBINDEX", "TROPONINI" in the last 168 hours. BNP (last 3 results) No results  for input(s): "PROBNP" in the last 8760 hours. HbA1C: No results for input(s): "HGBA1C" in the last 72 hours. CBG: Recent Labs  Lab 03/03/22 1624 03/03/22 2109 03/04/22 0723 03/04/22 0724 03/04/22 0751  GLUCAP 184* 302* 47* 49* 75   Lipid Profile: No results for input(s): "CHOL", "HDL", "LDLCALC", "TRIG", "CHOLHDL", "LDLDIRECT" in the last 72 hours. Thyroid Function Tests: No results for input(s): "TSH", "T4TOTAL", "FREET4", "T3FREE", "THYROIDAB" in the last 72 hours. Anemia Panel: No results for input(s): "VITAMINB12", "FOLATE", "FERRITIN", "TIBC", "IRON", "RETICCTPCT" in the last 72 hours. Sepsis Labs: Recent Labs  Lab 02/25/22 1656 02/26/22 0333 03/02/22 0814 03/04/22 0558  PROCALCITON  --   --  <0.10 <0.10  LATICACIDVEN 2.1* 0.8  --   --     Recent Results (from the past 240 hour(s))  Resp Panel by RT-PCR (Flu A&B, Covid) Anterior Nasal Swab     Status: None   Collection Time: 02/25/22  4:33 PM   Specimen: Anterior Nasal Swab  Result Value Ref Range Status   SARS Coronavirus 2 by RT PCR NEGATIVE NEGATIVE Final    Comment: (NOTE) SARS-CoV-2 target nucleic acids are NOT DETECTED.  The SARS-CoV-2 RNA is generally detectable in upper respiratory specimens during the acute phase of infection. The lowest concentration of SARS-CoV-2 viral copies this assay can detect is 138 copies/mL. A negative result does not preclude SARS-Cov-2 infection and should not be used as the sole basis for treatment or other patient management decisions. A negative result may occur with  improper specimen collection/handling, submission of specimen other than nasopharyngeal swab, presence of viral mutation(s) within the areas targeted by this assay, and inadequate number of viral copies(<138 copies/mL). A negative result must be combined with clinical observations, patient history, and epidemiological information. The expected result is Negative.  Fact Sheet for Patients:   EntrepreneurPulse.com.au  Fact Sheet for Healthcare Providers:  IncredibleEmployment.be  This test is no t yet approved or cleared by the [place] FDA and  has been authorized for detection and/or diagnosis of SARS-CoV-2 by FDA under an Emergency Use Authorization (  EUA). This EUA will remain  in effect (meaning this test can be used) for the duration of the COVID-19 declaration under Section 564(b)(1) of the Act, 21 U.S.C.section 360bbb-3(b)(1), unless the authorization is terminated  or revoked sooner.       Influenza A by PCR NEGATIVE NEGATIVE Final   Influenza B by PCR NEGATIVE NEGATIVE Final    Comment: (NOTE) The Xpert Xpress SARS-CoV-2/FLU/RSV plus assay is intended as an aid in the diagnosis of influenza from Nasopharyngeal swab specimens and should not be used as a sole basis for treatment. Nasal washings and aspirates are unacceptable for Xpert Xpress SARS-CoV-2/FLU/RSV testing.  Fact Sheet for Patients: EntrepreneurPulse.com.au  Fact Sheet for Healthcare Providers: IncredibleEmployment.be  This test is not yet approved or cleared by the [place] FDA and has been authorized for detection and/or diagnosis of SARS-CoV-2 by FDA under an Emergency Use Authorization (EUA). This EUA will remain in effect (meaning this test can be used) for the duration of the COVID-19 declaration under Section 564(b)(1) of the Act, 21 U.S.C. section 360bbb-3(b)(1), unless the authorization is terminated or revoked.  Performed at [facility], [address] [place], [place] [address]   Culture, blood (Routine X 2) w Reflex to ID Panel     Status: None   Collection Time: 02/25/22  4:57 PM   Specimen: BLOOD  Result Value Ref Range Status   Specimen Description   Final    BLOOD BOTTLES DRAWN AEROBIC AND ANAEROBIC LEFT ANTECUBITAL Performed at [facility], [address] [place], [place] [address]    Special Requests   Final    Blood Culture adequate volume Performed at [facility], [address] [place], [place] [address]    Culture   Final    NO GROWTH 5 DAYS Performed at [facility], [place] [address], [place], [place] [address]    Report Status 03/02/2022 FINAL  Final  Respiratory (~20 pathogens) panel by PCR     Status: None   Collection Time: 02/25/22  9:09 PM   Specimen: Nasopharyngeal Swab; Respiratory  Result Value Ref Range Status   Adenovirus NOT DETECTED NOT DETECTED Final   Coronavirus 229E NOT DETECTED NOT DETECTED Final    Comment: (NOTE) The Coronavirus on the Respiratory Panel, DOES NOT test for the novel  Coronavirus (2019 nCoV)    Coronavirus HKU1 NOT DETECTED NOT DETECTED Final   Coronavirus NL63 NOT DETECTED NOT DETECTED Final   Coronavirus OC43 NOT DETECTED NOT DETECTED Final   Metapneumovirus NOT DETECTED NOT DETECTED Final   Rhinovirus / Enterovirus NOT DETECTED NOT DETECTED Final   Influenza A NOT DETECTED NOT DETECTED Final   Influenza B NOT DETECTED NOT DETECTED Final   Parainfluenza Virus 1 NOT DETECTED NOT DETECTED Final   Parainfluenza Virus 2 NOT DETECTED NOT DETECTED Final   Parainfluenza Virus 3 NOT DETECTED NOT DETECTED Final   Parainfluenza Virus 4 NOT DETECTED NOT DETECTED Final   Respiratory Syncytial Virus NOT DETECTED NOT DETECTED Final   Bordetella pertussis NOT DETECTED NOT DETECTED Final   Bordetella Parapertussis NOT DETECTED NOT DETECTED Final   Chlamydophila pneumoniae NOT DETECTED NOT DETECTED Final   Mycoplasma pneumoniae NOT DETECTED NOT DETECTED Final    Comment: Performed at [facility], [place] [address] [place], [place] [address]  Culture, blood (Routine X 2) w Reflex to ID Panel     Status: None   Collection Time: 02/26/22 10:27 AM  Specimen: BLOOD  Result Value Ref Range Status   Specimen Description   Final    BLOOD BLOOD LEFT HAND Performed at [place] [address] [place], [place] [address]    Special Requests   Final    BOTTLES DRAWN AEROBIC AND ANAEROBIC Blood Culture adequate volume Performed at [place] [address] [place], [place] [address]    Culture   Final    NO GROWTH 5 DAYS Performed at [facility], [place] [address], [place], [place] [address]    Report Status 03/03/2022 FINAL  Final  Urine Culture     Status: None   Collection Time: 02/27/22  8:31 AM   Specimen: Urine, Clean Catch  Result Value Ref Range Status   Specimen Description   Final    URINE, CLEAN CATCH Performed at [facility], [place] [address], [place], [place] [address]    Special Requests   Final    NONE Performed at [facility], [place] [address] [place], [place] [address]    Culture   Final    NO GROWTH Performed at [facility], [place] [address] [place], [place] [address]    Report Status 02/28/2022 FINAL  Final  MRSA Next Gen by PCR, Nasal     Status: None   Collection Time: 02/28/22  7:13 PM   Specimen: Nasal Mucosa; Nasal Swab  Result Value Ref Range Status   MRSA by PCR Next Gen NOT DETECTED NOT DETECTED Final    Comment: (NOTE) The GeneXpert MRSA Assay (FDA approved for NASAL specimens only), is one component of a comprehensive MRSA colonization surveillance program. It is not intended to diagnose MRSA infection nor to guide or monitor treatment for MRSA infections. Test performance is not FDA approved in patients less than 28 years old. Performed at [facility], [place] [address], [place], [place] [address]     Radiology Studies: DG Chest 1 View  Result Date: 03/04/2022 CLINICAL DATA:  Pulmonary edema, lung cancer EXAM: CHEST  1 VIEW COMPARISON:  03/02/2022. FINDINGS: Alveolar density mid to lower left lung consistent with pneumonia. Vascular congestion. No pneumothorax. No definite pleural effusion. Calcified aorta. Right-sided  Port-A-Cath tip overlies distal SVC. IMPRESSION: Vascular congestion. Alveolar density left mid to lower lung, likely pneumonia. Follow-up recommended to confirm resolution and exclude other etiologies. Electronically Signed   By: Sammie Bench M.D.   On: 03/04/2022 08:23    Scheduled Meds:  bisacodyl  10 mg Rectal Daily   budesonide (PULMICORT) nebulizer solution  0.25 mg Nebulization BID   Chlorhexidine Gluconate Cloth  6 each Topical Daily   enoxaparin (LOVENOX) injection  55 mg Subcutaneous BID   FLUoxetine  20 mg Oral Daily   fluticasone  2 spray Each Nare Daily   furosemide  40 mg Intravenous BID   insulin aspart  0-15 Units Subcutaneous TID WC   insulin aspart  0-5 Units Subcutaneous QHS   insulin aspart  4 Units Subcutaneous TID WC   insulin glargine-yfgn  34 Units Subcutaneous QHS   ipratropium-albuterol  3 mL Nebulization TID   levothyroxine  125 mcg Oral Q0600   loratadine  10 mg Oral Daily   mouth rinse  15 mL Mouth Rinse 4 times per day   oxyCODONE  10 mg Oral Q12H   pantoprazole  40 mg Oral Daily   polyethylene glycol  17 g Oral BID   predniSONE  50 mg Oral  Q breakfast   senna-docusate  2 tablet Oral BID   sulfamethoxazole-trimethoprim  1 tablet Oral Once per day on Mon Wed Fri   Continuous Infusions:   LOS: 6 days   Raiford Noble, DO [facility] Available via Epic secure chat 7am-7pm After these hours, please refer to coverage provider listed on [email or web address] 03/04/2022, 8:43 AM

## 2022-03-04 NOTE — Progress Notes (Signed)
NAME:  [name], MRN:  [id number], DOB:  [birth year], LOS: 6 ADMISSION DATE:  02/25/2022, CONSULTATION DATE:  02/26/22 REFERRING MD:  Irine Seal, MD CHIEF COMPLAINT:  Acute hypoxemic respiratory failure   History of Present Illness:  71 year old female former smoker (50 pack-years, quit 2018) who presented to the ED and found with hypoxemia. PTA had 1 week URI associated with myalgias , chills, fever and urinary frequency and diarrhea. COVID neg at home. She continued to feel poorly and presented to urgent care with SpO2 89% so was advised to present to the ED. CTA demonstrated small segmental and segmental PE in RUL and patchy infiltrates bilaterally concerning for pneumonia. RVP and COVID/influenza neg. Started on supplemental oxygen, ceftriaxone and azithromycin and heparin. PCCM consulted.  She reports she is active at baseline with no limitations in activity. Followed by Dr. Lamonte Sakai for her diagnosis of lung cancer but did not need any bronchodilators. Last radiation treatment was in October 2023. No immediate issues post-procedure. URI symptoms one week ago as noted above. No wheezing or productive cough.  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 Hiatal hernia. Unchanged lymphadenopathy. Emphysema. Patchy GGO bilaterally. Prior bone mets seen. 11/25 Transferred to SDU for BiPAP for pulmonary edema  Interim History / Subjective:  Stable night. Remains on HHFNC, 50% 30L saturating 90%  Objective   Blood pressure 121/64, pulse 88, temperature 98 F (36.7 C), temperature source Axillary, resp. rate  16, height 4' 11.5" (1.511 m), weight 53 kg, SpO2 96 %.    FiO2 (%):  [45 %-75 %] 45 %   Intake/Output Summary (Last 24 hours) at 03/04/2022 0715 Last data filed at 03/04/2022 0600 Gross per 24 hour  Intake 240 ml  Output 500 ml  Net -260 ml    Filed Weights   03/01/22 0500 03/02/22 0500 03/03/22 0500  Weight: 53.2 kg 53.5 kg 53 kg   Physical Exam: No events resting comfortably Lungs crackles L base Ext warm Abd soft No edema  LFTs/BMP/CBC stable CXR developing LLL infiltrate  Resolved Hospital Problem list   N/A  Assessment & Plan:   Acute hypoxemic respiratory failure 2/2 multifocal pneumonia +/- subacute radiation pneumonitis, now acute pulmonary edema Emphysema, minimal/mild - no bronchodilators or O2 at baseline, current on scheduled nebs Subsegmental PE - on Eliquis. Heme/Onc following Hx metastatic small cell carcinoma of lung s/p chemoradiation - Last XRT 01/2022, last chemo 10/2021 DM2- poorly controlled with hyperglycemia, exacerbated by steroids  - Restart lasix - Prednisone 50mg /day (1mg /kg) - Bactrim for PJP ppx - Wean HHFNC for sats > 90% - PT consult, up to chair during day - Insulin management per primary - I think she's fine for eliquis but defer to heme-onc - Check Pct - Will follow, will be slow wean: treat inflammation, PE, fluid, deconditioning  Best Practice (right click and "Reselect all SmartList Selections" daily)   Diet/type: diabetic DVT prophylaxis: lovenox GI prophylaxis: PPI Lines: N/A Foley:  N/A Code Status:  DNR Last date of multidisciplinary goals of care discussion [ 11/26 by [place] care time: [place] MD [facility] Pulmonary/Critical Care Medicine 03/04/2022 7:15  AM   See Amion for personal pager For hours between 7 PM to 7 AM, please call Elink for urgent questions

## 2022-03-04 NOTE — Progress Notes (Signed)
Patient was scheduled for PET today, however she is currently admitted with radiation pneumonitis. Will follow for post discharge needs, office follow up and rescheduling of PET.   Oncology Nurse Navigator Documentation     03/04/2022    8:00 AM  Oncology Nurse Navigator Flowsheets  Navigator Follow Up Date: 03/09/2022  Navigator Follow Up Reason: Appointment Review  Navigator Location [facility]  Navigator Encounter Type Appt/Treatment Plan Review;Telephone  Patient Visit Type MedOnc  Treatment Phase Active Tx  Barriers/Navigation Needs Coordination of Care;Education  Interventions None Required  Acuity Level 2-Minimal Needs (1-2 Barriers Identified)  Support Groups/Services Friends and Family  Time Spent with Patient 15

## 2022-03-04 NOTE — Progress Notes (Signed)
Pharmacy: electrolyte replacement  Phos 2.2; K 4, Na 142  Plan: Naphos 15 mMol per eLink protocol  Eudelia Bunch, Pharm.D Use secure chat for questions 03/04/2022 12:13 PM

## 2022-03-04 NOTE — Evaluation (Signed)
Physical Therapy Evaluation Patient Details Name: [name] MRN: [id number] DOB: [birth year] Today's Date: 03/04/2022  History of Present Illness  71 year old female who presented to the ED 02/25/22  with hypoxemia. PTA had 1 week URI associated with myalgias , chills, fever and urinary frequency and diarrhea.  CTA demonstrated small segmental and segmental PE in RUL and patchy infiltrates bilaterally concerning for pneumonia. PMH: Lung cancer with livr andbone mets  DM, OSA.back surgery, transfer to ICU 11/25 for worsening respiratory status.  Clinical Impression  Pt admitted with above diagnosis.  Pt currently with functional limitations due to the deficits listed below (see PT Problem List). Pt will benefit from skilled PT to increase their independence and safety with mobility to allow discharge to the venue listed below.     The patient is eager to mobilize. Patient resting in bed on  HHFNC-30L/50% Fio2. SPO2 94%, HR 112. After transfer to recliner and step in place, SPO2 down to 84%. RN adjusted O2 to return to >90%.  Patient  pleased to be  up OOB.     Recommendations for follow up therapy are one component of a multi-disciplinary discharge planning process, led by the attending physician.  Recommendations may be updated based on patient status, additional functional criteria and insurance authorization.  Follow Up Recommendations Home health PT      Assistance Recommended at Discharge Frequent or constant Supervision/Assistance  Patient can return home with the following  A little help with walking and/or transfers;A little help with bathing/dressing/bathroom;Help with stairs or ramp for entrance;Assistance with cooking/housework;Assist for transportation    Equipment Recommendations Rolling walker (2 wheels)  Recommendations for Other Services       Functional Status Assessment Patient has had a recent decline in their functional status and demonstrates the ability to make  significant improvements in function in a reasonable and predictable amount of time.     Precautions / Restrictions Precautions Precautions: Fall Precaution Comments: on HHHFNC      Mobility  Bed Mobility Overal bed mobility: Needs Assistance Bed Mobility: Supine to Sit     Supine to sit: Supervision     General bed mobility comments: extra time    Transfers Overall transfer level: Needs assistance Equipment used: 1 person hand held assist Transfers: Sit to/from Stand, Bed to chair/wheelchair/BSC Sit to Stand: Min assist   Step pivot transfers: Min assist       General transfer comment: hand hold assist.    Ambulation/Gait                  Stairs            Wheelchair Mobility    Modified Rankin (Stroke Patients Only)       Balance Overall balance assessment: Mild deficits observed, not formally tested                                           Pertinent Vitals/Pain Pain Assessment Pain Assessment: No/denies pain    Home Living Family/patient expects to be discharged to:: Private residence Living Arrangements: Spouse/significant other;Children Available Help at Discharge: Family;Available 24 hours/day Type of Home: House Home Access: Stairs to enter   [employer] of Steps: 1   Home Layout: One level Home Equipment: None      Prior Function Prior Level of Function : Independent/Modified Independent  Mobility Comments: oes not drive ADLs Comments: IADLs     Hand Dominance   Dominant Hand: Right    Extremity/Trunk Assessment   Upper Extremity Assessment Upper Extremity Assessment: Overall WFL for tasks assessed    Lower Extremity Assessment Lower Extremity Assessment: Generalized weakness    Cervical / Trunk Assessment Cervical / Trunk Assessment: Kyphotic  Communication   Communication: No difficulties  Cognition Arousal/Alertness: Awake/alert Behavior During Therapy: WFL  for tasks assessed/performed Overall Cognitive Status: Within Functional Limits for tasks assessed                                          General Comments      Exercises     Assessment/Plan    PT Assessment Patient needs continued PT services  PT Problem List Decreased mobility;Cardiopulmonary status limiting activity;Decreased activity tolerance;Decreased knowledge of precautions;Decreased knowledge of use of DME       PT Treatment Interventions DME instruction;Therapeutic activities;Gait training;Therapeutic exercise;Patient/family education;Functional mobility training    PT Goals (Current goals can be found in the Care Plan section)  Acute Rehab PT Goals Patient Stated Goal: go home PT Goal Formulation: With patient Time For Goal Achievement: 03/18/22 Potential to Achieve Goals: Good    Frequency Min 3X/week     Co-evaluation               AM-PAC PT "6 Clicks" Mobility  Outcome Measure Help needed turning from your back to your side while in a flat bed without using bedrails?: A Little Help needed moving from lying on your back to sitting on the side of a flat bed without using bedrails?: A Little Help needed moving to and from a bed to a chair (including a wheelchair)?: A Little Help needed standing up from a chair using your arms (e.g., wheelchair or bedside chair)?: A Little Help needed to walk in hospital room?: Total Help needed climbing 3-5 steps with a railing? : Total 6 Click Score: 14    End of Session Equipment Utilized During Treatment: Oxygen Activity Tolerance: Treatment limited secondary to medical complications (Comment) (drop in sats) Patient left: in chair;with call bell/phone within reach;with nursing/sitter in room Nurse Communication: Mobility status PT Visit Diagnosis: Unsteadiness on feet (R26.81);Difficulty in walking, not elsewhere classified (R26.2)    Time: 0355-9741 PT Time Calculation (min) (ACUTE ONLY): 20  min   Charges:   PT Evaluation $PT Eval Low Complexity: 1 Low          [place] Office [phone] Weekend [phone]   Claretha Cooper 03/04/2022, 10:48 AM

## 2022-03-04 NOTE — Progress Notes (Signed)
Pt continues to prefer wearing her HHFNC instead of BiPAP while sleeping.

## 2022-03-04 NOTE — Progress Notes (Signed)
Hypoglycemic Event  CBG: 49  Treatment: 8 oz juice/soda  Symptoms: None  Follow-up CBG: Time:0752  CBG Result:75  Possible Reasons for Event: Unknown, will notify MD   Comments/MD notified:Sheikh, Omair Latif [place]

## 2022-03-05 ENCOUNTER — Inpatient Hospital Stay (HOSPITAL_COMMUNITY): Payer: PPO

## 2022-03-05 DIAGNOSIS — I2693 Single subsegmental pulmonary embolism without acute cor pulmonale: Secondary | ICD-10-CM | POA: Diagnosis not present

## 2022-03-05 DIAGNOSIS — C3490 Malignant neoplasm of unspecified part of unspecified bronchus or lung: Secondary | ICD-10-CM | POA: Diagnosis not present

## 2022-03-05 DIAGNOSIS — J9601 Acute respiratory failure with hypoxia: Secondary | ICD-10-CM | POA: Diagnosis not present

## 2022-03-05 DIAGNOSIS — E039 Hypothyroidism, unspecified: Secondary | ICD-10-CM | POA: Diagnosis not present

## 2022-03-05 LAB — COMPREHENSIVE METABOLIC PANEL
ALT: 76 U/L — ABNORMAL HIGH (ref 0–44)
AST: 48 U/L — ABNORMAL HIGH (ref 15–41)
Albumin: 2.9 g/dL — ABNORMAL LOW (ref 3.5–5.0)
Alkaline Phosphatase: 66 U/L (ref 38–126)
Anion gap: 9 (ref 5–15)
BUN: 22 mg/dL (ref 8–23)
CO2: 32 mmol/L (ref 22–32)
Calcium: 8.9 mg/dL (ref 8.9–10.3)
Chloride: 100 mmol/L (ref 98–111)
Creatinine, Ser: 0.7 mg/dL (ref 0.44–1.00)
GFR, Estimated: >60 mL/min (ref >60)
Glucose, Bld: 73 mg/dL (ref 70–99)
Potassium: 3.9 mmol/L (ref 3.5–5.1)
Sodium: 141 mmol/L (ref 135–145)
Total Bilirubin: 0.6 mg/dL (ref 0.3–1.2)
Total Protein: 7.6 g/dL (ref 6.5–8.1)

## 2022-03-05 LAB — CBC WITH DIFFERENTIAL/PLATELET
Abs Immature Granulocytes: 0.25 10*3/uL — ABNORMAL HIGH (ref 0.00–0.07)
Basophils Absolute: 0 10*3/uL (ref 0.0–0.1)
Basophils Relative: 0 %
Eosinophils Absolute: 0.2 10*3/uL (ref 0.0–0.5)
Eosinophils Relative: 2 %
HCT: 35.7 % — ABNORMAL LOW (ref 36.0–46.0)
Hemoglobin: 11.2 g/dL — ABNORMAL LOW (ref 12.0–15.0)
Immature Granulocytes: 2 %
Lymphocytes Relative: 11 %
Lymphs Abs: 1.4 10*3/uL (ref 0.7–4.0)
MCH: 29.6 pg (ref 26.0–34.0)
MCHC: 31.4 g/dL (ref 30.0–36.0)
MCV: 94.4 fL (ref 80.0–100.0)
Monocytes Absolute: 0.9 10*3/uL (ref 0.1–1.0)
Monocytes Relative: 7 %
Neutro Abs: 9.8 10*3/uL — ABNORMAL HIGH (ref 1.7–7.7)
Neutrophils Relative %: 78 %
Platelets: 383 10*3/uL (ref 150–400)
RBC: 3.78 MIL/uL — ABNORMAL LOW (ref 3.87–5.11)
RDW: 13.7 % (ref 11.5–15.5)
WBC: 12.5 10*3/uL — ABNORMAL HIGH (ref 4.0–10.5)
nRBC: 0 % (ref 0.0–0.2)

## 2022-03-05 LAB — RETICULOCYTES
Immature Retic Fract: 11.7 % (ref 2.3–15.9)
RBC.: 3.81 MIL/uL — ABNORMAL LOW (ref 3.87–5.11)
Retic Count, Absolute: 138.3 10*3/uL (ref 19.0–186.0)
Retic Ct Pct: 3.6 % — ABNORMAL HIGH (ref 0.4–3.1)

## 2022-03-05 LAB — GLUCOSE, CAPILLARY
Glucose-Capillary: 97 mg/dL (ref 70–99)
Glucose-Capillary: 505 mg/dL (ref 70–99)
Glucose-Capillary: 541 mg/dL (ref 70–99)
Glucose-Capillary: 485 mg/dL — ABNORMAL HIGH (ref 70–99)
Glucose-Capillary: 596 mg/dL (ref 70–99)
Glucose-Capillary: 286 mg/dL — ABNORMAL HIGH (ref 70–99)
Glucose-Capillary: 91 mg/dL (ref 70–99)

## 2022-03-05 LAB — FERRITIN: Ferritin: 1198 ng/mL — ABNORMAL HIGH (ref 11–307)

## 2022-03-05 LAB — MAGNESIUM: Magnesium: 2.3 mg/dL (ref 1.7–2.4)

## 2022-03-05 LAB — IRON AND TIBC
Iron: 90 ug/dL (ref 28–170)
Saturation Ratios: 38 % — ABNORMAL HIGH (ref 10.4–31.8)
TIBC: 239 ug/dL — ABNORMAL LOW (ref 250–450)
UIBC: 149 ug/dL

## 2022-03-05 LAB — VITAMIN B12: Vitamin B-12: 445 pg/mL (ref 180–914)

## 2022-03-05 LAB — PHOSPHORUS: Phosphorus: 3.4 mg/dL (ref 2.5–4.6)

## 2022-03-05 LAB — FOLATE: Folate: 6.2 ng/mL (ref >5.9)

## 2022-03-05 MED ORDER — ENSURE ENLIVE PO LIQD
237.0000 mL | Freq: Two times a day (BID) | ORAL | Status: DC
  Administered 2022-03-05 – 2022-03-12 (×9): 237 mL via ORAL

## 2022-03-05 MED ORDER — INSULIN ASPART 100 UNIT/ML IJ SOLN
11.0000 [IU] | Freq: Once | INTRAMUSCULAR | Status: AC
  Administered 2022-03-05: 11 [IU] via SUBCUTANEOUS

## 2022-03-05 MED ORDER — INSULIN GLARGINE-YFGN 100 UNIT/ML ~~LOC~~ SOLN
30.0000 [IU] | Freq: Every day | SUBCUTANEOUS | Status: DC
  Administered 2022-03-05: 30 [IU] via SUBCUTANEOUS
  Filled 2022-03-05 (×2): qty 0.3

## 2022-03-05 MED ORDER — POTASSIUM CHLORIDE CRYS ER 20 MEQ PO TBCR
40.0000 meq | EXTENDED_RELEASE_TABLET | Freq: Once | ORAL | Status: AC
  Administered 2022-03-05: 40 meq via ORAL
  Filled 2022-03-05: qty 2

## 2022-03-05 MED ORDER — INSULIN ASPART 100 UNIT/ML IJ SOLN
0.0000 [IU] | Freq: Every day | INTRAMUSCULAR | Status: DC
  Administered 2022-03-07: 3 [IU] via SUBCUTANEOUS
  Administered 2022-03-08: 2 [IU] via SUBCUTANEOUS
  Administered 2022-03-09: 3 [IU] via SUBCUTANEOUS
  Administered 2022-03-11: 4 [IU] via SUBCUTANEOUS

## 2022-03-05 MED ORDER — INSULIN ASPART 100 UNIT/ML IJ SOLN
8.0000 [IU] | Freq: Once | INTRAMUSCULAR | Status: AC
  Administered 2022-03-05: 8 [IU] via SUBCUTANEOUS

## 2022-03-05 MED ORDER — INSULIN ASPART 100 UNIT/ML IJ SOLN
0.0000 [IU] | Freq: Three times a day (TID) | INTRAMUSCULAR | Status: DC
  Administered 2022-03-06: 15 [IU] via SUBCUTANEOUS
  Administered 2022-03-06: 20 [IU] via SUBCUTANEOUS
  Administered 2022-03-07: 3 [IU] via SUBCUTANEOUS
  Administered 2022-03-07: 20 [IU] via SUBCUTANEOUS
  Administered 2022-03-08: 15 [IU] via SUBCUTANEOUS
  Administered 2022-03-08: 7 [IU] via SUBCUTANEOUS
  Administered 2022-03-09: 4 [IU] via SUBCUTANEOUS
  Administered 2022-03-09: 7 [IU] via SUBCUTANEOUS
  Administered 2022-03-10: 3 [IU] via SUBCUTANEOUS
  Administered 2022-03-10: 7 [IU] via SUBCUTANEOUS
  Administered 2022-03-10: 15 [IU] via SUBCUTANEOUS
  Administered 2022-03-11: 7 [IU] via SUBCUTANEOUS
  Administered 2022-03-11 – 2022-03-12 (×2): 15 [IU] via SUBCUTANEOUS

## 2022-03-05 NOTE — Evaluation (Signed)
Occupational Therapy Evaluation Patient Details Name: [name] MRN: [id number] DOB: [birth year] Today's Date: 03/05/2022   History of Present Illness 71 year old female who presented to the ED 02/25/22  with hypoxemia. PTA had 1 week URI associated with myalgias , chills, fever and urinary frequency and diarrhea.  CTA demonstrated small segmental and segmental PE in RUL and patchy infiltrates bilaterally concerning for pneumonia. PMH: Lung cancer with livr andbone mets  DM, OSA.back surgery, transfer to ICU 11/25 for worsening respiratory status.   Clinical Impression   [name] is a 71 year old woman admitted with above medical history and now presents with decreased activity tolerance and impaired cardiopulmonary endurance requiring 30 L HHFNC  and 50% fio2. Patient overall min assist for transfers and ADLs - requiring mostly seated positioning. Her o2 sat maintained WFL but HR elevated to 135 with just standing. Activity limited to just transfer to chair and assessment. Patient will benefit from skilled OT services while in hospital to improve deficits and learn compensatory strategies as needed in order to return to PLOF.  Expect once patient off [place] will do well and not require OT services at discharge.      Recommendations for follow up therapy are one component of a multi-disciplinary discharge planning process, led by the attending physician.  Recommendations may be updated based on patient status, additional functional criteria and insurance authorization.   Follow Up Recommendations  No OT follow up     Assistance Recommended at Discharge Intermittent Supervision/Assistance  Patient can return home with the following Assistance with cooking/housework    Functional Status Assessment  Patient has had a recent decline in their functional status and demonstrates the ability to make significant improvements in function in a reasonable and predictable amount of time.   Equipment Recommendations  None recommended by OT    Recommendations for Other Services       Precautions / Restrictions Precautions Precautions: Fall Precaution Comments: on HHHFNC, monitor HR Restrictions Weight Bearing Restrictions: No      Mobility Bed Mobility Overal bed mobility: Needs Assistance Bed Mobility: Supine to Sit     Supine to sit: Supervision          Transfers Overall transfer level: Needs assistance Equipment used: 1 person hand held assist Transfers: Sit to/from Stand, Bed to chair/wheelchair/BSC Sit to Stand: Min assist     Step pivot transfers: Min assist     General transfer comment: hand hold assist.      Balance Overall balance assessment: Mild deficits observed, not formally tested                                         ADL either performed or assessed with clinical judgement   ADL Overall ADL's : Needs assistance/impaired Eating/Feeding: Independent   Grooming: Set up;Sitting   Upper Body Bathing: Set up;Sitting   Lower Body Bathing: Sit to/from stand;Minimal assistance   Upper Body Dressing : Set up;Sitting   Lower Body Dressing: Sit to/from stand;Minimal assistance Lower Body Dressing Details (indicate cue type and reason): able to use figure four method to reach feet Toilet Transfer: Minimal assistance;BSC/3in1   Toileting- Clothing Manipulation and Hygiene: Moderate assistance;Sit to/from stand       Functional mobility during ADLs: Minimal assistance       Vision Patient Visual Report: No change from baseline  Perception     Praxis      Pertinent Vitals/Pain Pain Assessment Pain Assessment: No/denies pain     Hand Dominance Right   Extremity/Trunk Assessment Upper Extremity Assessment Upper Extremity Assessment: Overall WFL for tasks assessed   Lower Extremity Assessment Lower Extremity Assessment: Defer to PT evaluation   Cervical / Trunk Assessment Cervical / Trunk  Assessment: Normal   Communication Communication Communication: No difficulties   Cognition Arousal/Alertness: Awake/alert Behavior During Therapy: WFL for tasks assessed/performed Overall Cognitive Status: Within Functional Limits for tasks assessed                                       General Comments       Exercises     Shoulder Instructions      Home Living Family/patient expects to be discharged to:: Private residence Living Arrangements: Spouse/significant other;Children Available Help at Discharge: Family;Available 24 hours/day Type of Home: House Home Access: Stairs to enter [employer] of Steps: 1   Home Layout: One level     Bathroom Shower/Tub: Tub/shower unit;Walk-in shower         Home Equipment: None          Prior Functioning/Environment Prior Level of Function : Independent/Modified Independent             Mobility Comments: oes not drive ADLs Comments: IADLs        OT Problem List: Decreased activity tolerance;Cardiopulmonary status limiting activity;Decreased strength;Decreased knowledge of use of DME or AE      OT Treatment/Interventions: Self-care/ADL training;Therapeutic exercise;DME and/or AE instruction;Therapeutic activities;Balance training;Patient/family education    OT Goals(Current goals can be found in the care plan section) Acute Rehab OT Goals Patient Stated Goal: To go home at discharge OT Goal Formulation: With patient Time For Goal Achievement: 03/20/22 Potential to Achieve Goals: Good  OT Frequency: Min 2X/week    Co-evaluation              AM-PAC OT "6 Clicks" Daily Activity     Outcome Measure Help from another person eating meals?: None Help from another person taking care of personal grooming?: A Little Help from another person toileting, which includes using toliet, bedpan, or urinal?: A Little Help from another person bathing (including washing, rinsing, drying)?: A  Little Help from another person to put on and taking off regular upper body clothing?: A Little Help from another person to put on and taking off regular lower body clothing?: A Little 6 Click Score: 19   End of Session Equipment Utilized During Treatment: Oxygen Nurse Communication: Mobility status  Activity Tolerance: Patient tolerated treatment well Patient left: in chair;with family/visitor present  OT Visit Diagnosis: Muscle weakness (generalized) (M62.81)                Time: 6222-9798 OT Time Calculation (min): 10 min Charges:  OT General Charges $OT Visit: 1 Visit OT Evaluation $OT Eval Low Complexity: 1 Low  Gustavo Lah, OTR/L [place]  Office [phone]   Lenward Chancellor 03/05/2022, 3:15 PM

## 2022-03-05 NOTE — Progress Notes (Signed)
NAME:  [name], MRN:  [id number], DOB:  1950-05-15, LOS: 7 ADMISSION DATE:  02/25/2022, CONSULTATION DATE:  02/26/22 REFERRING MD:  Irine Seal, MD CHIEF COMPLAINT:  Acute hypoxemic respiratory failure   History of Present Illness:  71 year old female former smoker (50 pack-years, quit 2018) who presented to the ED and found with hypoxemia. PTA had 1 week URI associated with myalgias , chills, fever and urinary frequency and diarrhea. COVID neg at home. She continued to feel poorly and presented to urgent care with SpO2 89% so was advised to present to the ED. CTA demonstrated small segmental and segmental PE in RUL and patchy infiltrates bilaterally concerning for pneumonia. RVP and COVID/influenza neg. Started on supplemental oxygen, ceftriaxone and azithromycin and heparin. PCCM consulted.  She reports she is active at baseline with no limitations in activity. Followed by Dr. Lamonte Sakai for her diagnosis of lung cancer but did not need any bronchodilators. Last radiation treatment was in October 2023. No immediate issues post-procedure. URI symptoms one week ago as noted above. No wheezing or productive cough.  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 Hiatal hernia. Unchanged lymphadenopathy. Emphysema. Patchy GGO bilaterally. Prior bone mets seen. 11/25 Transferred to SDU for BiPAP for pulmonary edema  Interim History / Subjective:  No events. 50% 30L Diuresed okay  Objective   Blood pressure 121/79, pulse (!) 108, temperature 98.5 F (36.9 C), temperature source Axillary, resp. rate 15, height 4'  11.5" (1.511 m), weight 53 kg, SpO2 93 %.    FiO2 (%):  [50 %] 50 %   Intake/Output Summary (Last 24 hours) at 03/05/2022 0745 Last data filed at 03/05/2022 0700 Gross per 24 hour  Intake 129.11 ml  Output 1300 ml  Net -1170.89 ml    Filed Weights   03/01/22 0500 03/02/22 0500 03/03/22 0500  Weight: 53.2 kg 53.5 kg 53 kg   Physical Exam: No events Crackles worse on R Follows commands x 4 Pulls around 750 still with IS, not using as much as she should  Cr up a bit but BUN ok Stable transaminitis CBC stable CXR still looks wet with superimposed L>R bibasilar opacities  Resolved Hospital Problem list   N/A  Assessment & Plan:   Acute hypoxemic respiratory failure multifactorial 2/2 ?radiation pneumonitis, ?aspiration, pulmonary embolus, now acute pulmonary edema, some improvement with diuresis +steroids but O2 need is stubborn.  Some of this is lack of mobility and derecruitment. Baseline abnormal lung architecture- prior Cts show bronchiectasis in lower lobes. Hx metastatic small cell carcinoma of lung s/p chemoradiation - Last XRT 01/2022, last chemo 10/2021 DM2- poorly controlled with hyperglycemia, exacerbated by steroids   - Continue lasix as tolerated by renal function - Replete K - Prednisone 50mg /day (1mg /kg) - Bactrim for PJP ppx - Wean HHFNC for sats > 90% - PT consult, up to chair during day - Insulin management per primary - AC per heme onc - Dropped her to 25L 50%, if does okay with this can try 12-13L with regular salter - Needs to be in chair during day and use IS hourly - Will follow, will be slow wean: treat inflammation, PE, fluid,  deconditioning  Best Practice (right click and "Reselect all SmartList Selections" daily)   Diet/type: diabetic DVT prophylaxis: lovenox GI prophylaxis: PPI Lines: N/A Foley:  N/A Code Status:  DNR Last date of multidisciplinary goals of care discussion [ 11/26 by [address] [place] care time: [place]  MD Romeo 03/05/2022 7:45 AM   See Amion for personal pager For hours between 7 PM to 7 AM, please call Elink for urgent questions

## 2022-03-05 NOTE — Progress Notes (Signed)
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  Patient noted prior to admission to have some chills, body aches, fever as high as 101.  Patient noted to have sats of 89% on room air and sent to the ED from urgent care center.  COVID-19 PCR negative, influenza a and B PCR negative, respiratory viral panel negative.  CT chest done with segmental PE, concern for pneumonia.  Patient placed empirically on IV antibiotics, anticoagulation.  PCCM consulted due to concerns for radiation pneumonitis.    **Interim History PCCM/pulmonary has been consulted and recommending physical therapy and checking procalcitonin.  They are recommending resuming Lasix and continue Bactrim for PJP prophylaxis and recommending a slow steroid taper and wean and treating inflammation, PE fluid as well deconditioning.  She remains on quite a bit of oxygen still but pulmonary is attempting to wean and have dropped her to 25 L with 50% and recommends that if she does okay that they can try 12 to 13 L with a regular salter and recommending that she be in the chair during the day and using incentive spirometry hourly.  She is being continued diuresis and anticoagulation as per the hematologist oncologist and they are recommending Lovenox.   Assessment and Plan: * Acute respiratory failure with hypoxia [facility]) -Patient admitted as patient had presented acute respiratory failure with hypoxia.   -Likely multifactorial secondary to pneumonia in a patient with immunosuppression, concern for radiation pneumonitis and also noted to have segmental PE which is unlikely source of hypoxia.   Also concern for possible volume overload as  patient noted to have worsening hypoxia on 02/28/2022 required BiPAP and subsequently transferred to the stepdown unit.   -Still with significant O2 requirements.  Currently on high flow nasal cannula with a FiO2 of 80%, flow rate of 30.  Off BiPAP since 03/02/2022.  -SpO2: 97 % O2 Flow Rate (L/min): 30 L/min FiO2 (%): 40 % -COVID-19 PCR negative, influenza A and B negative, respiratory viral panel negative.   -CT angiogram chest with pulmonary emboli focal in the right upper lung distal segmental and subsegmental vessels, developing patchy airspace interstitial infiltrates most likely representing multifocal pneumonia, prominent mediastinal and abdominal lymph nodes likely metastatic, multiple bone mets.   -Patient with complaints of chills and fatigue early on in the hospitalization.   -Noted to have a Tmax of 101.6 (02/27/2022). -Fever curve trended down and has been afebrile in the last 2 days. -Urine strep pneumococcus antigen negative.  Urine Legionella antigen negative. -Due to Tmax of 101.6 antibiotic coverage was broadened to IV cefepime and IV azithromycin.   -IV Rocephin discontinued.   -Patient pancultured.  -Continue scheduled DuoNebs, Claritin, PPI, Pulmicort -Anticoagulation changed from Eliquis to Lovenox per hematology/oncology recommendations.  -PCCM consulted due to concerns for radiation pneumonitis and patient started on a prednisone taper. -Due to worsening hypoxia CXR obtained concerning for possible volume overload and patient placed on IV Lasix and had been held due to her dizziness blood pressures but now will be continued and getting IV 40 mg twice daily -Chest x-ray with slight improvement however due to ongoing hypoxia although prednisone changed to IV Solu-Medrol and dose increased per PCCM to 60  mg daily (03/01/2022) and now IV Solu-Medrol changed to oral prednisone 50 mg daily per PCCM on 03/02/2022. -Patient has been changed from ICS/LABA to Pulmicort nebs,  continue diuresis per PCCM and no significant improvement in O2 requirements may need repeat CT chest. -PCT is <0.10 -WBC went from 12.5 -> 13.4 -> 12.5 -Strict I's and O's and Daily Weights; Patient is -67.513 Liters  -She is on Bactrim for PJP prophylaxis -Continue with DuoNeb 3 MLS 3 times daily as well as budesonide -PCCM following and appreciate further evaluation and continues to get diuresis.   Subsegmental pulmonary embolism without acute cor pulmonale (HCC) -Small PE. Doubt this would cause cardiac issues.  -Patient initially placed on IV heparin in the ED and has been transitioned to Eliquis. -Patient on BiPAP and due to worsening respiratory status hematology/oncology recommending changing from Eliquis to IV heparin/Lovenox. -Patient transition to full dose Lovenox and will likely be discharged on full dose Lovenox per hematology/oncology recommendations..  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  -Followed by oncology. Had 4 sessions of XRT in October 2023. Last session on 01-08-2022.  -Last received IV chemo July 2023. -Oncology following and recommending Lovenox .   Uncontrolled Type 2 diabetes mellitus without complication, with long-term current use of insulin (HCC) -Stable.  -Hemoglobin A1c 11.3 (02/21/2021) -Repeat hemoglobin A1c 10.4 (02/26/2022). -CBG 70 on morning labs.  CBG noted to be in the 300s overnight..  -Elevated CBG secondary to steroids. -Continued Semglee to 34 units daily but will reduce to 25 units given hypoglycemia this AM, meal coverage NovoLog 4 units 3 times daily with meals.   -Increase Moderate Novolog SSI to resistant scale -CBGs ranging from 97-596   Hypothyroidism -Continue home regimen Synthroid 125 mcg daily.    Normocytic Anemia -Patient with no overt bleeding. -Anemia likely secondary to acute illness and  radiation treatment. -Hemoglobin stable at 10.6 but improved to 11.2 -Anemia panel done and showed an iron level 90, UIBC 149, TIBC 239, saturation ratios of 38%, ferritin level of 1198, folate of 6.2, vitamin B12 445 -Follow H&H. -Transfusion threshold hemoglobin < 7   Hypophosphatemia -Replete via Pharmacy Protocol -Phosphorus at 3.4 -Continue to Monitor and Trend and repeat CMP in the AM    Hypomagnesemia -Magnesium at 2.3. -Repeat labs in AM.   Hypokalemia -Repleted, potassium is now 3.9 -Magnesium at 2.3 -Repeat labs in AM.   Other constipation -Continue current bowel regimen of Senokot-S twice daily, MiraLAX twice daily, Dulcolax suppository daily.    Hyponatremia, improved  - TSH 2.597.  -Free T4  1.28.   -Urine sodium 142, urine potassium 27,  -Concern hyponatremia may be secondary to small cell lung cancer.  -Sodium levels improved currently at 141 this morning.  -Repeat labs in the AM.    Abnormal LFTs -Patient's AST went from 48 -> 42 -> 48 and ALT is 65 -> 76 -Continue to Monitor and trend and repeat CMP in the a.m. and if necessary will obtain a right upper quadrant ultrasound as well as an acute hepatitis panel  DVT prophylaxis: SCDs Start: 02/26/22 0404    Code Status: DNR Family Communication: No family currently at bedside  Disposition Plan:  Level of care: Stepdown Status is: Inpatient Remains inpatient appropriate because: Still remains on quite a bit of supplemental oxygen will need further weaning   Consultants:  Pulmonary/PCCM  Procedures:  As Delineated as above  Antimicrobials:  Anti-infectives (From admission, onward)    Start     Dose/Rate Route Frequency Ordered Stop   03/04/22 0900  sulfamethoxazole-trimethoprim (BACTRIM DS) 800-160 MG per tablet 1 tablet        1 tablet Oral Once per day on Mon Wed Fri 03/03/22 0952     03/01/22 2200  azithromycin (ZITHROMAX) 500 mg in sodium chloride 0.9 % 250 mL IVPB        500 mg 250 mL/hr  over 60 Minutes Intravenous Every 24 hours 03/01/22 0940 03/02/22 2320   02/28/22 2200  azithromycin (ZITHROMAX) tablet 500 mg  Status:  Discontinued        500 mg Oral Daily at bedtime 02/28/22 1154 03/01/22 0940   02/27/22 1000  ceFEPIme (MAXIPIME) 2 g in sodium chloride 0.9 % 100 mL IVPB  Status:  Discontinued        2 g 200 mL/hr over 30 Minutes Intravenous Every 12 hours 02/27/22 0856 03/03/22 0951   02/26/22 2200  azithromycin (ZITHROMAX) 500 mg in sodium chloride 0.9 % 250 mL IVPB  Status:  Discontinued        500 mg 250 mL/hr over 60 Minutes Intravenous Every 24 hours 02/26/22 0900 02/28/22 1153   02/26/22 1800  cefTRIAXone (ROCEPHIN) 2 g in sodium chloride 0.9 % 100 mL IVPB  Status:  Discontinued        2 g 200 mL/hr over 30 Minutes Intravenous Every 24 hours 02/26/22 0900 02/27/22 0838   02/25/22 2015  cefTRIAXone (ROCEPHIN) 1 g in sodium chloride 0.9 % 100 mL IVPB        1 g 200 mL/hr over 30 Minutes Intravenous  Once 02/25/22 2011 02/25/22 2059   02/25/22 2015  azithromycin (ZITHROMAX) 500 mg in sodium chloride 0.9 % 250 mL IVPB        500 mg 250 mL/hr over 60 Minutes Intravenous  Once 02/25/22 2011 02/25/22 2206       Subjective: Seen and examined at bedside and she is still requiring quite a bit of oxygen.  Pulmonary is attempting to wean her further.  Continuing to diurese.  Feels okay.  No nausea or vomiting.  No other concerns or complaints at this time.  Objective: Vitals:   03/05/22 1200 03/05/22 1400 03/05/22 1508 03/05/22 1600  BP: 128/75 132/89    Pulse: (!) 125 (!) 118    Resp: (!) 27     Temp: 98.2 F (36.8 C)   98.1 F (36.7 C)  TempSrc: Oral   Axillary  SpO2: 94% 95% 97%   Weight:      Height:        Intake/Output Summary (Last 24 hours) at 03/05/2022 1801 Last data filed at 03/05/2022 1400 Gross per 24 hour  Intake --  Output 1150 ml  Net -1150 ml   Filed Weights   03/01/22 0500 03/02/22 0500 03/03/22 0500  Weight: 53.2 kg 53.5 kg 53 kg    Examination: Physical Exam:  Constitutional: Chronically ill-appearing Caucasian female currently no acute distress Respiratory: Diminished to auscultation bilaterally with coarse breath sounds and some crackles and mild rhonchi.  Has increased respiratory effort and is wearing heated high flow nasal cannula Cardiovascular: RRR, no murmurs / rubs / gallops. S1 and S2 auscultated.  Has some mild lower extremity edema Abdomen: Soft, non-tender, non-distended. Bowel sounds positive.  GU: Deferred. Musculoskeletal: No clubbing / cyanosis of digits/nails. No joint deformity upper and lower extremities. Skin: No rashes, lesions, ulcers on limited skin evaluation. No  induration; Warm and dry.  Neurologic: CN 2-12 grossly intact with no focal deficits. Romberg sign and cerebellar reflexes not assessed.  Psychiatric: Normal judgment and insight. Alert and oriented x 3. Normal mood and appropriate affect.   Data Reviewed: I have personally reviewed following labs and imaging studies  CBC: Recent Labs  Lab 03/01/22 0500 03/02/22 0433 03/03/22 0625 03/04/22 0558 03/05/22 0440  WBC 13.5* 13.2* 12.5* 13.4* 12.5*  NEUTROABS 11.6* 11.6* 9.3* 10.1* 9.8*  HGB 9.6* 9.8* 10.6* 10.4* 11.2*  HCT 30.4* 31.7* 34.6* 34.0* 35.7*  MCV 92.1 93.0 94.8 95.2 94.4  PLT 288 349 428* 440* 220   Basic Metabolic Panel: Recent Labs  Lab 02/27/22 0316 02/28/22 0316 03/01/22 0500 03/02/22 0433 03/03/22 0625 03/04/22 0558 03/05/22 0440  NA 133*   < > 138 139 141 142 141  K 4.8   < > 4.3 4.6 3.5 4.0 3.9  CL 100   < > 101 102 102 107 100  CO2 25   < > 28 28 29 29  32  GLUCOSE 283*   < > 185* 152* 70 63* 73  BUN 7*   < > 26* 29* 30* 22 22  CREATININE 0.52   < > 0.62 0.48 0.62 0.47 0.70  CALCIUM 7.7*   < > 8.0* 7.8* 7.9* 8.5* 8.9  MG 2.5*  --  2.1  --  2.0 2.4 2.3  PHOS 3.2  --   --   --  2.6 2.2* 3.4   < > = values in this interval not displayed.   GFR: Estimated Creatinine Clearance: 45.2 mL/min (by  C-G formula based on SCr of 0.7 mg/dL). Liver Function Tests: Recent Labs  Lab 03/01/22 0500 03/02/22 0433 03/03/22 0625 03/04/22 0558 03/05/22 0440  AST 48* 57* 48* 42* 48*  ALT 39 56* 65* 65* 76*  ALKPHOS 59 58 64 64 66  BILITOT 0.6 0.6 0.6 0.6 0.6  PROT 7.4 7.2 7.4 7.2 7.6  ALBUMIN 2.7* 2.7* 2.8* 2.6* 2.9*   No results for input(s): "LIPASE", "AMYLASE" in the last 168 hours. No results for input(s): "AMMONIA" in the last 168 hours. Coagulation Profile: No results for input(s): "INR", "PROTIME" in the last 168 hours. Cardiac Enzymes: No results for input(s): "CKTOTAL", "CKMB", "CKMBINDEX", "TROPONINI" in the last 168 hours. BNP (last 3 results) No results for input(s): "PROBNP" in the last 8760 hours. HbA1C: No results for input(s): "HGBA1C" in the last 72 hours. CBG: Recent Labs  Lab 03/05/22 1227 03/05/22 1236 03/05/22 1330 03/05/22 1417 03/05/22 1614  GLUCAP 505* 541* 596* 485* 286*   Lipid Profile: No results for input(s): "CHOL", "HDL", "LDLCALC", "TRIG", "CHOLHDL", "LDLDIRECT" in the last 72 hours. Thyroid Function Tests: No results for input(s): "TSH", "T4TOTAL", "FREET4", "T3FREE", "THYROIDAB" in the last 72 hours. Anemia Panel: Recent Labs    03/05/22 0440  VITAMINB12 445  FOLATE 6.2  FERRITIN 1,198*  TIBC 239*  IRON 90  RETICCTPCT 3.6*   Sepsis Labs: Recent Labs  Lab 03/02/22 0814 03/04/22 0558  PROCALCITON <0.10 <0.10    Recent Results (from the past 240 hour(s))  Resp Panel by RT-PCR (Flu A&B, Covid) Anterior Nasal Swab     Status: None   Collection Time: 02/25/22  4:33 PM   Specimen: Anterior Nasal Swab  Result Value Ref Range Status   SARS Coronavirus 2 by RT PCR NEGATIVE NEGATIVE Final    Comment: (NOTE) SARS-CoV-2 target nucleic acids are NOT DETECTED.  The SARS-CoV-2 RNA is generally detectable in upper respiratory  specimens during the acute phase of infection. The lowest concentration of SARS-CoV-2 viral copies this assay can  detect is 138 copies/mL. A negative result does not preclude SARS-Cov-2 infection and should not be used as the sole basis for treatment or other patient management decisions. A negative result may occur with  improper specimen collection/handling, submission of specimen other than nasopharyngeal swab, presence of viral mutation(s) within the areas targeted by this assay, and inadequate number of viral copies(<138 copies/mL). A negative result must be combined with clinical observations, patient history, and epidemiological information. The expected result is Negative.  Fact Sheet for Patients:  EntrepreneurPulse.com.au  Fact Sheet for Healthcare Providers:  IncredibleEmployment.be  This test is no t yet approved or cleared by the [place] FDA and  has been authorized for detection and/or diagnosis of SARS-CoV-2 by FDA under an Emergency Use Authorization (EUA). This EUA will remain  in effect (meaning this test can be used) for the duration of the COVID-19 declaration under Section 564(b)(1) of the Act, 21 U.S.C.section 360bbb-3(b)(1), unless the authorization is terminated  or revoked sooner.       Influenza A by PCR NEGATIVE NEGATIVE Final   Influenza B by PCR NEGATIVE NEGATIVE Final    Comment: (NOTE) The Xpert Xpress SARS-CoV-2/FLU/RSV plus assay is intended as an aid in the diagnosis of influenza from Nasopharyngeal swab specimens and should not be used as a sole basis for treatment. Nasal washings and aspirates are unacceptable for Xpert Xpress SARS-CoV-2/FLU/RSV testing.  Fact Sheet for Patients: EntrepreneurPulse.com.au  Fact Sheet for Healthcare Providers: IncredibleEmployment.be  This test is not yet approved or cleared by the [place] FDA and has been authorized for detection and/or diagnosis of SARS-CoV-2 by FDA under an Emergency Use Authorization (EUA). This EUA will remain in  effect (meaning this test can be used) for the duration of the COVID-19 declaration under Section 564(b)(1) of the Act, 21 U.S.C. section 360bbb-3(b)(1), unless the authorization is terminated or revoked.  Performed at [facility], [address] [place], [place] [address]   Culture, blood (Routine X 2) w Reflex to ID Panel     Status: None   Collection Time: 02/25/22  4:57 PM   Specimen: BLOOD  Result Value Ref Range Status   Specimen Description   Final    BLOOD BOTTLES DRAWN AEROBIC AND ANAEROBIC LEFT ANTECUBITAL Performed at [facility], [address] [place], [place] [address]    Special Requests   Final    Blood Culture adequate volume Performed at [facility], [address] [place], [place] [address]    Culture   Final    NO GROWTH 5 DAYS Performed at [facility], [place] [address] [place], [place] [address]    Report Status 03/02/2022 FINAL  Final  Respiratory (~20 pathogens) panel by PCR     Status: None   Collection Time: 02/25/22  9:09 PM   Specimen: Nasopharyngeal Swab; Respiratory  Result Value Ref Range Status   Adenovirus NOT DETECTED NOT DETECTED Final   Coronavirus 229E NOT DETECTED NOT DETECTED Final    Comment: (NOTE) The Coronavirus on the Respiratory Panel, DOES NOT test for the novel  Coronavirus (2019 nCoV)    Coronavirus HKU1 NOT DETECTED NOT DETECTED Final   Coronavirus NL63 NOT DETECTED NOT DETECTED Final   Coronavirus OC43 NOT DETECTED NOT DETECTED Final   Metapneumovirus NOT DETECTED NOT DETECTED Final   Rhinovirus / Enterovirus NOT DETECTED  NOT DETECTED Final   Influenza A NOT DETECTED NOT DETECTED Final   Influenza B NOT DETECTED NOT DETECTED Final   Parainfluenza Virus 1 NOT DETECTED NOT DETECTED Final   Parainfluenza Virus 2 NOT DETECTED NOT DETECTED Final   Parainfluenza Virus 3 NOT DETECTED NOT DETECTED Final   Parainfluenza Virus 4 NOT DETECTED NOT DETECTED Final   Respiratory Syncytial  Virus NOT DETECTED NOT DETECTED Final   Bordetella pertussis NOT DETECTED NOT DETECTED Final   Bordetella Parapertussis NOT DETECTED NOT DETECTED Final   Chlamydophila pneumoniae NOT DETECTED NOT DETECTED Final   Mycoplasma pneumoniae NOT DETECTED NOT DETECTED Final    Comment: Performed at [facility], [place] [address] [place], [place] [address]  Culture, blood (Routine X 2) w Reflex to ID Panel     Status: None   Collection Time: 02/26/22 10:27 AM   Specimen: BLOOD  Result Value Ref Range Status   Specimen Description   Final    BLOOD BLOOD LEFT HAND Performed at [facility] [address] [place], [place] [address]    Special Requests   Final    BOTTLES DRAWN AEROBIC AND ANAEROBIC Blood Culture adequate volume Performed at [place] [address], [place], [place] [address]    Culture   Final    NO GROWTH 5 DAYS Performed at [facility], [place] [address], [place], [place] [address]    Report Status 03/03/2022 FINAL  Final  Urine Culture     Status: None   Collection Time: 02/27/22  8:31 AM   Specimen: Urine, Clean Catch  Result Value Ref Range Status   Specimen Description   Final    URINE, CLEAN CATCH Performed at [facility], [place] [address], [place], [place] [address]    Special Requests   Final    NONE Performed at [facility], [place] [address] [place], [place] [address]    Culture   Final    NO GROWTH Performed at [facility], [place] [address] [place], [place] [address]    Report Status 02/28/2022 FINAL  Final  MRSA Next Gen by PCR, Nasal     Status: None   Collection Time: 02/28/22  7:13 PM   Specimen: Nasal Mucosa; Nasal Swab  Result Value Ref Range Status   MRSA by PCR Next Gen NOT DETECTED NOT DETECTED Final    Comment: (NOTE) The GeneXpert MRSA Assay (FDA approved for NASAL specimens only), is one component of a comprehensive MRSA colonization  surveillance program. It is not intended to diagnose MRSA infection nor to guide or monitor treatment for MRSA infections. Test performance is not FDA approved in patients less than 25 years old. Performed at [facility], [place] [address] [place], [place] [address]      Radiology Studies: DG CHEST PORT 1 VIEW  Result Date: 03/05/2022 CLINICAL DATA:  Shortness of breath. EXAM: PORTABLE CHEST 1 VIEW COMPARISON:  Radiographs 03/04/2022 and 03/02/2022.  CT 02/25/2022. FINDINGS: 0506 hours. There are slightly lower lung volumes. Right IJ Port-A-Cath tip is unchanged at the level of the mid right atrium. The heart size and mediastinal contours are stable. There are increased left-greater-than-right basilar opacities. No evidence of pneumothorax or significant pleural effusion. The bones appear unchanged. Postsurgical changes are present in the cervicothoracic spine. Telemetry leads overlie the chest. IMPRESSION: Lower lung volumes with increased left-greater-than-right basilar opacities, likely atelectasis superimposed on underlying pneumonia. No other significant  changes. Electronically Signed   By: Richardean Sale M.D.   On: 03/05/2022 08:12   DG Chest 1 View  Result Date: 03/04/2022 CLINICAL DATA:  Pulmonary edema, lung cancer EXAM: CHEST  1 VIEW COMPARISON:  03/02/2022. FINDINGS: Alveolar density mid to lower left lung consistent with pneumonia. Vascular congestion. No pneumothorax. No definite pleural effusion. Calcified aorta. Right-sided Port-A-Cath tip overlies distal SVC. IMPRESSION: Vascular congestion. Alveolar density left mid to lower lung, likely pneumonia. Follow-up recommended to confirm resolution and exclude other etiologies. Electronically Signed   By: Sammie Bench M.D.   On: 03/04/2022 08:23    Scheduled Meds:  bisacodyl  10 mg Rectal Daily   budesonide (PULMICORT) nebulizer solution  0.25 mg Nebulization BID   Chlorhexidine Gluconate Cloth  6 each Topical  Daily   enoxaparin (LOVENOX) injection  55 mg Subcutaneous BID   feeding supplement  237 mL Oral BID BM   FLUoxetine  20 mg Oral Daily   fluticasone  2 spray Each Nare Daily   furosemide  40 mg Intravenous BID   insulin aspart  0-20 Units Subcutaneous TID WC   insulin aspart  0-5 Units Subcutaneous QHS   insulin aspart  4 Units Subcutaneous TID WC   insulin glargine-yfgn  30 Units Subcutaneous QHS   ipratropium-albuterol  3 mL Nebulization TID   levothyroxine  125 mcg Oral Q0600   loratadine  10 mg Oral Daily   mouth rinse  15 mL Mouth Rinse 4 times per day   oxyCODONE  10 mg Oral Q12H   pantoprazole  40 mg Oral Daily   polyethylene glycol  17 g Oral BID   predniSONE  50 mg Oral Q breakfast   senna-docusate  2 tablet Oral BID   sulfamethoxazole-trimethoprim  1 tablet Oral Once per day on Mon Wed Fri   Continuous Infusions:   LOS: 7 days   Raiford Noble, DO [facility] Available via Epic secure chat 7am-7pm After these hours, please refer to coverage provider listed on [email or web address] 03/05/2022, 6:01 PM

## 2022-03-06 ENCOUNTER — Inpatient Hospital Stay (HOSPITAL_COMMUNITY): Payer: PPO

## 2022-03-06 DIAGNOSIS — I2693 Single subsegmental pulmonary embolism without acute cor pulmonale: Secondary | ICD-10-CM | POA: Diagnosis not present

## 2022-03-06 DIAGNOSIS — C3490 Malignant neoplasm of unspecified part of unspecified bronchus or lung: Secondary | ICD-10-CM | POA: Diagnosis not present

## 2022-03-06 DIAGNOSIS — J9601 Acute respiratory failure with hypoxia: Secondary | ICD-10-CM | POA: Diagnosis not present

## 2022-03-06 DIAGNOSIS — E039 Hypothyroidism, unspecified: Secondary | ICD-10-CM | POA: Diagnosis not present

## 2022-03-06 LAB — COMPREHENSIVE METABOLIC PANEL
ALT: 94 U/L — ABNORMAL HIGH (ref 0–44)
AST: 63 U/L — ABNORMAL HIGH (ref 15–41)
Albumin: 3.1 g/dL — ABNORMAL LOW (ref 3.5–5.0)
Alkaline Phosphatase: 68 U/L (ref 38–126)
Anion gap: 8 (ref 5–15)
BUN: 33 mg/dL — ABNORMAL HIGH (ref 8–23)
CO2: 31 mmol/L (ref 22–32)
Calcium: 9 mg/dL (ref 8.9–10.3)
Chloride: 99 mmol/L (ref 98–111)
Creatinine, Ser: 0.67 mg/dL (ref 0.44–1.00)
GFR, Estimated: >60 mL/min (ref >60)
Glucose, Bld: 61 mg/dL — ABNORMAL LOW (ref 70–99)
Potassium: 4 mmol/L (ref 3.5–5.1)
Sodium: 138 mmol/L (ref 135–145)
Total Bilirubin: 0.6 mg/dL (ref 0.3–1.2)
Total Protein: 7.5 g/dL (ref 6.5–8.1)

## 2022-03-06 LAB — PHOSPHORUS: Phosphorus: 4.5 mg/dL (ref 2.5–4.6)

## 2022-03-06 LAB — CBC WITH DIFFERENTIAL/PLATELET
Abs Immature Granulocytes: 0.28 10*3/uL — ABNORMAL HIGH (ref 0.00–0.07)
Basophils Absolute: 0.1 10*3/uL (ref 0.0–0.1)
Basophils Relative: 0 %
Eosinophils Absolute: 0.3 10*3/uL (ref 0.0–0.5)
Eosinophils Relative: 2 %
HCT: 38.6 % (ref 36.0–46.0)
Hemoglobin: 12 g/dL (ref 12.0–15.0)
Immature Granulocytes: 2 %
Lymphocytes Relative: 10 %
Lymphs Abs: 1.5 10*3/uL (ref 0.7–4.0)
MCH: 29.5 pg (ref 26.0–34.0)
MCHC: 31.1 g/dL (ref 30.0–36.0)
MCV: 94.8 fL (ref 80.0–100.0)
Monocytes Absolute: 1.1 10*3/uL — ABNORMAL HIGH (ref 0.1–1.0)
Monocytes Relative: 7 %
Neutro Abs: 12.2 10*3/uL — ABNORMAL HIGH (ref 1.7–7.7)
Neutrophils Relative %: 79 %
Platelets: 439 10*3/uL — ABNORMAL HIGH (ref 150–400)
RBC: 4.07 MIL/uL (ref 3.87–5.11)
RDW: 14 % (ref 11.5–15.5)
WBC: 15.4 10*3/uL — ABNORMAL HIGH (ref 4.0–10.5)
nRBC: 0 % (ref 0.0–0.2)

## 2022-03-06 LAB — GLUCOSE, CAPILLARY
Glucose-Capillary: 76 mg/dL (ref 70–99)
Glucose-Capillary: 454 mg/dL — ABNORMAL HIGH (ref 70–99)
Glucose-Capillary: 504 mg/dL (ref 70–99)
Glucose-Capillary: 481 mg/dL — ABNORMAL HIGH (ref 70–99)
Glucose-Capillary: 301 mg/dL — ABNORMAL HIGH (ref 70–99)
Glucose-Capillary: 164 mg/dL — ABNORMAL HIGH (ref 70–99)

## 2022-03-06 LAB — MAGNESIUM: Magnesium: 2 mg/dL (ref 1.7–2.4)

## 2022-03-06 MED ORDER — INSULIN GLARGINE-YFGN 100 UNIT/ML ~~LOC~~ SOLN
28.0000 [IU] | Freq: Every day | SUBCUTANEOUS | Status: DC
  Administered 2022-03-06 – 2022-03-11 (×6): 28 [IU] via SUBCUTANEOUS
  Filled 2022-03-06 (×6): qty 0.28

## 2022-03-06 MED ORDER — INSULIN ASPART 100 UNIT/ML IJ SOLN
5.0000 [IU] | Freq: Once | INTRAMUSCULAR | Status: AC
  Administered 2022-03-06: 5 [IU] via SUBCUTANEOUS

## 2022-03-06 NOTE — Progress Notes (Signed)
Physical Therapy Treatment Patient Details Name: [name] MRN: [id number] DOB: 11-Jun-1950 Today's Date: 03/06/2022   History of Present Illness 71 year old female who presented to the ED 02/25/22  with hypoxemia. PTA had 1 week URI associated with myalgias , chills, fever and urinary frequency and diarrhea.  CTA demonstrated small segmental and segmental PE in RUL and patchy infiltrates bilaterally concerning for pneumonia. PMH: Lung cancer with livr andbone mets  DM, OSA.back surgery, transfer to ICU 11/25 for worsening respiratory status.    PT Comments    Pt received supine in bed agreeable to mobilize with family present. Required supervision for bed mobility and min assist for transfers, further mobility deferred as pt desatted to 85% with slow return to >90% while in recliner, pt declining additional mobility or exercises. Encouraged pt to sit up in recliner to tolerance, pt verbalized understanding. RN in room up on exit. We will continue to follow acutely. Vitals prior to mobility: BP 132/80, RR26, SpO292%, HR122.  Vitals post-mobility: BP119/78, HR 128, 85%, RR31.     Recommendations for follow up therapy are one component of a multi-disciplinary discharge planning process, led by the attending physician.  Recommendations may be updated based on patient status, additional functional criteria and insurance authorization.  Follow Up Recommendations  Home health PT     Assistance Recommended at Discharge Frequent or constant Supervision/Assistance  Patient can return home with the following A little help with walking and/or transfers;A little help with bathing/dressing/bathroom;Help with stairs or ramp for entrance;Assistance with cooking/housework;Assist for transportation   Equipment Recommendations  Rolling walker (2 wheels)    Recommendations for Other Services       Precautions / Restrictions Precautions Precautions: Fall Precaution Comments: on HHHFNC, monitor  HR Restrictions Weight Bearing Restrictions: No     Mobility  Bed Mobility Overal bed mobility: Needs Assistance Bed Mobility: Supine to Sit     Supine to sit: Supervision     General bed mobility comments: extra time    Transfers Overall transfer level: Needs assistance Equipment used: 1 person hand held assist Transfers: Sit to/from Stand, Bed to chair/wheelchair/BSC Sit to Stand: Min assist   Step pivot transfers: Min assist       General transfer comment: hand hold assist.    Ambulation/Gait               General Gait Details: deferred   Stairs             Wheelchair Mobility    Modified Rankin (Stroke Patients Only)       Balance Overall balance assessment: Mild deficits observed, not formally tested                                          Cognition Arousal/Alertness: Awake/alert Behavior During Therapy: WFL for tasks assessed/performed Overall Cognitive Status: Within Functional Limits for tasks assessed                                          Exercises      General Comments General comments (skin integrity, edema, etc.): Husband [name] present      Pertinent Vitals/Pain Pain Assessment Pain Assessment: No/denies pain    Home Living  Prior Function            PT Goals (current goals can now be found in the care plan section) Acute Rehab PT Goals Patient Stated Goal: go home PT Goal Formulation: With patient Time For Goal Achievement: 03/18/22 Potential to Achieve Goals: Good Progress towards PT goals: Progressing toward goals    Frequency    Min 3X/week      PT Plan Current plan remains appropriate    Co-evaluation              AM-PAC PT "6 Clicks" Mobility   Outcome Measure  Help needed turning from your back to your side while in a flat bed without using bedrails?: A Little Help needed moving from lying on your back to  sitting on the side of a flat bed without using bedrails?: A Little Help needed moving to and from a bed to a chair (including a wheelchair)?: A Little Help needed standing up from a chair using your arms (e.g., wheelchair or bedside chair)?: A Little Help needed to walk in hospital room?: A Little Help needed climbing 3-5 steps with a railing? : A Lot 6 Click Score: 17    End of Session Equipment Utilized During Treatment: Oxygen Activity Tolerance: Treatment limited secondary to medical complications (Comment) (drop in sats) Patient left: in chair;with call bell/phone within reach;with chair alarm set;with nursing/sitter in room;with family/visitor present Nurse Communication: Mobility status PT Visit Diagnosis: Unsteadiness on feet (R26.81);Difficulty in walking, not elsewhere classified (R26.2)     Time: 0981-1914 PT Time Calculation (min) (ACUTE ONLY): 10 min  Charges:  $Therapeutic Activity: 8-22 mins                     Coolidge Breeze, PT, DPT [facility] Department Office: [phone] Weekend pager: [phone]   Coolidge Breeze 03/06/2022, 3:51 PM

## 2022-03-06 NOTE — Progress Notes (Signed)
RT changed HHFNC water bottle.

## 2022-03-06 NOTE — Progress Notes (Signed)
NAME:  [name], MRN:  [id number], DOB:  [birth year], LOS: 8 ADMISSION DATE:  02/25/2022, CONSULTATION DATE:  02/26/22 REFERRING MD:  Irine Seal, MD CHIEF COMPLAINT:  Acute hypoxemic respiratory failure   History of Present Illness:  71 year old female former smoker (50 pack-years, quit 2018) who presented to the ED and found with hypoxemia. PTA had 1 week URI associated with myalgias , chills, fever and urinary frequency and diarrhea. COVID neg at home. She continued to feel poorly and presented to urgent care with SpO2 89% so was advised to present to the ED. CTA demonstrated small segmental and segmental PE in RUL and patchy infiltrates bilaterally concerning for pneumonia. RVP and COVID/influenza neg. Started on supplemental oxygen, ceftriaxone and azithromycin and heparin. PCCM consulted.  She reports she is active at baseline with no limitations in activity. Followed by Dr. Lamonte Sakai for her diagnosis of lung cancer but did not need any bronchodilators. Last radiation treatment was in October 2023. No immediate issues post-procedure. URI symptoms one week ago as noted above. No wheezing or productive cough.  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 Hiatal hernia. Unchanged lymphadenopathy. Emphysema. Patchy GGO bilaterally. Prior bone mets seen. 11/25 Transferred to SDU for BiPAP for pulmonary edema  Interim History / Subjective:  No events. 50% 30L Diuresed okay  Objective   Blood pressure 127/74, pulse (!) 110, temperature 98.2 F (36.8 C), temperature source Oral, resp. rate 18, height 4'  11.5" (1.511 m), weight 51.9 kg, SpO2 90 %.    FiO2 (%):  [40 %-50 %] 40 %   Intake/Output Summary (Last 24 hours) at 03/06/2022 0857 Last data filed at 03/06/2022 0600 Gross per 24 hour  Intake --  Output 1000 ml  Net -1000 ml   Filed Weights   03/02/22 0500 03/03/22 0500 03/06/22 0500  Weight: 53.5 kg 53 kg 51.9 kg   Physical Exam:   Resolved Hospital Problem list   N/A  Assessment & Plan:   Acute hypoxemic respiratory failure multifactorial 2/2 ?radiation pneumonitis, ?aspiration, pulmonary embolus, now acute pulmonary edema, some improvement with diuresis +steroids but O2 need is stubborn.  Some of this is lack of mobility and derecruitment. Baseline abnormal lung architecture- prior Cts show bronchiectasis in lower lobes. Hx metastatic small cell carcinoma of lung s/p chemoradiation - Last XRT 01/2022, last chemo 10/2021 DM2- poorly controlled with hyperglycemia, exacerbated by steroids   - Continue lasix as tolerated by renal function - Follow electrolytes and replete as needed - Prednisone 50mg /day (1mg /kg), will plan taper in coming days - Bactrim for PJP ppx - Wean HHFNC for sats > 90% - PT consult, up to chair during day.  - Insulin management per primary - AC per heme onc - Needs to be in chair during day and use IS hourly - Will follow, will be slow wean: treat inflammation, PE, fluid, deconditioning  Best Practice (right click and "Reselect all SmartList Selections" daily)   Diet/type: diabetic DVT prophylaxis: lovenox GI prophylaxis: PPI Lines: N/A Foley:  N/A Code Status:  DNR Last date of multidisciplinary goals of care discussion [ 11/26 by Ellison]  Critical care time: 35 minutes  Freda Jackson, MD [facility] Office: [phone]   See Amion for personal pager PCCM on call pager [phone] until 7pm. Please call Elink 7p-7a. [phone]

## 2022-03-06 NOTE — Progress Notes (Signed)
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  Patient noted prior to admission to have some chills, body aches, fever as high as 101.  Patient noted to have sats of 89% on room air and sent to the ED from urgent care center.  COVID-19 PCR negative, influenza a and B PCR negative, respiratory viral panel negative.  CT chest done with segmental PE, concern for pneumonia.  Patient placed empirically on IV antibiotics, anticoagulation.  PCCM consulted due to concerns for radiation pneumonitis.    **Interim History PCCM/pulmonary has been consulted and recommending physical therapy and checking procalcitonin.  They are recommending resuming Lasix and continue Bactrim for PJP prophylaxis and recommending a slow steroid taper and wean and treating inflammation, PE fluid as well deconditioning.  She remains on quite a bit of oxygen still but pulmonary is attempting to wean and have dropped her to 25 L with 50% and recommends that if she does okay that they can try 12 to 13 L with a regular salter and recommending that she be in the chair during the day and using incentive spirometry hourly.  She is being continued diuresis and anticoagulation as per the hematologist oncologist and they are recommending Lovenox.  Slow to improve and continues to have significant oxygen requirements.  Appreciate pulmonary evaluation and they are planning to taper to prednisone in the coming days recommending continuing current plan of care and following electrolytes and replete as needed.  Assessment and Plan: * Acute respiratory failure with hypoxia [facility]) -Patient admitted as patient had presented acute respiratory  failure with hypoxia.   -Likely multifactorial secondary to pneumonia in a patient with immunosuppression, concern for radiation pneumonitis and also noted to have segmental PE which is unlikely source of hypoxia.   Also concern for possible volume overload as patient noted to have worsening hypoxia on 02/28/2022 required BiPAP and subsequently transferred to the stepdown unit.   -Still with significant O2 requirements.  Currently on high flow nasal cannula with a FiO2 of 80%, flow rate of 30.  Off BiPAP since 03/02/2022.  -SpO2: 97 % O2 Flow Rate (L/min): 30 L/min FiO2 (%): 40 % -COVID-19 PCR negative, influenza A and B negative, respiratory viral panel negative.   -CT angiogram chest with pulmonary emboli focal in the right upper lung distal segmental and subsegmental vessels, developing patchy airspace interstitial infiltrates most likely representing multifocal pneumonia, prominent mediastinal and abdominal lymph nodes likely metastatic, multiple bone mets.   -Patient with complaints of chills and fatigue early on in the hospitalization.   -Noted to have a Tmax of 101.6 (02/27/2022). -Fever curve trended down and has been afebrile in the last 2 days. -Urine strep pneumococcus antigen negative.  Urine Legionella antigen negative. -Due to Tmax of 101.6 antibiotic coverage was broadened to IV cefepime and IV azithromycin.   -IV Rocephin discontinued.   -Patient pancultured.  -Continue scheduled DuoNebs, Claritin, PPI, Pulmicort -Anticoagulation changed from Eliquis to Lovenox per hematology/oncology recommendations.  -PCCM consulted due to concerns for radiation pneumonitis and patient started on a prednisone taper. -Due to worsening hypoxia CXR obtained concerning for possible volume overload and patient placed on IV Lasix and had been held due to  her dizziness blood pressures but now will be continued and getting IV 40 mg twice daily -IV Solu-Medrol changed to oral prednisone 50 mg daily per  PCCM on 03/02/2022. -Patient has been changed from ICS/LABA to Pulmicort nebs, continue diuresis per PCCM and no significant improvement in O2 requirements may need repeat CT chest. -PCT is <0.10 -WBC went from 12.5 -> 13.4 -> 12.5 and now trended up to 15.4 -Strict I's and O's and Daily Weights; Patient is -6.413 liters  -She is on Bactrim for PJP prophylaxis -Continue with DuoNeb 3 MLS 3 times daily as well as budesonide -PCCM following and appreciate further evaluation and continues to get diuresis and prednisone scheduled to 50 and will be tapered in the next coming days -Chest x-ray today showed worsening and read as "Interstitial and patchy hazy opacities of the left-greater-than-right lower lung fields continue to be noted without significant improvement or worsening. Probable  multilobar pneumonia. Aortic atherosclerosis."   Subsegmental pulmonary embolism without acute cor pulmonale (HCC) -Small PE. Doubt this would cause cardiac issues.  -Patient initially placed on IV heparin in the ED and has been transitioned to Eliquis. -Patient on BiPAP and due to worsening respiratory status hematology/oncology recommending changing from Eliquis to IV heparin/Lovenox. -Patient transition to full dose Lovenox and will likely be discharged on full dose Lovenox per hematology/oncology recommendations..  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  -Followed by oncology. Had 4 sessions of XRT in October 2023. Last session on 01-08-2022.  -Last received IV chemo July 2023. -Oncology following and recommending Lovenox .   Uncontrolled Type 2 diabetes mellitus without complication, with long-term current use of insulin (HCC) -Stable.  -Hemoglobin A1c 11.3 (02/21/2021) -Repeat hemoglobin A1c 10.4 (02/26/2022). -CBG 70 on morning labs.  CBG noted to be in the 300s overnight..   -Elevated CBG secondary to steroids. -Continued Semglee to 34 units daily but was reduced to 25 units given hypoglycemia yesterday but is now back at 30 but will cut back down to 28, meal coverage NovoLog 4 units 3 times daily with meals.   -Increase Moderate Novolog SSI to resistant scale -CBGs ranging from 91-481 she is very labile   Hypothyroidism -Continue home regimen Synthroid 125 mcg daily.    Normocytic Anemia -Patient with no overt bleeding. -Anemia likely secondary to acute illness and radiation treatment. -Hemoglobin stable at 10.6 but improved to 11.2 yesterday.  Now patient's hemoglobin/hematocrit is now 12.0/38.6 -Anemia panel done and showed an iron level 90, UIBC 149, TIBC 239, saturation ratios of 38%, ferritin level of 1198, folate of 6.2, vitamin B12 445 -Follow H&H. -Transfusion threshold hemoglobin < 7   Hypophosphatemia -Replete via Pharmacy Protocol -Phosphorus at 4.5 -Continue to Monitor and Trend and repeat CMP in the AM    Hypomagnesemia -Magnesium at 2.0 -Repeat labs in AM.   Hypokalemia -Repleted, potassium is now 4.0  -Magnesium at 2.0 -Repeat labs in AM.   Other constipation -Continue current bowel regimen of Senokot-S twice daily, MiraLAX twice daily, Dulcolax suppository daily.    Hyponatremia, improved  - TSH 2.597.  -Free T4  1.28.   -Urine sodium 142, urine potassium 27,  -Concern hyponatremia may be secondary to small cell lung cancer.  -Sodium levels improved currently at 138 this morning.  -Repeat labs in the AM.    Abnormal LFTs -Patient's AST went from 48 -> 42 -> 48 -> 63 and ALT is  65 -> 76 -> 94 -Continue to Monitor and trend and repeat CMP in the a.m. and since trending up will  obtain a right upper quadrant ultrasound as well as an acute hepatitis panel -Continue monitor and trend and repeat CMP in the a.m.  Thrombocytosis -Likely in the setting of above -Patient platelet count went from 383 and is now 439 -Repeat CBC in  a.m.  DVT prophylaxis: SCDs Start: 02/26/22 0404    Code Status: DNR Family Communication: No family currently at bedside  Disposition Plan:  Level of care: Stepdown Status is: Inpatient Remains inpatient appropriate because: Remains on quite a bit of significant oxygen   Consultants:  Pulmonary/PCCM  Procedures:  As delineated as above  Antimicrobials:  Anti-infectives (From admission, onward)    Start     Dose/Rate Route Frequency Ordered Stop   03/04/22 0900  sulfamethoxazole-trimethoprim (BACTRIM DS) 800-160 MG per tablet 1 tablet        1 tablet Oral Once per day on Mon Wed Fri 03/03/22 0952     03/01/22 2200  azithromycin (ZITHROMAX) 500 mg in sodium chloride 0.9 % 250 mL IVPB        500 mg 250 mL/hr over 60 Minutes Intravenous Every 24 hours 03/01/22 0940 03/02/22 2320   02/28/22 2200  azithromycin (ZITHROMAX) tablet 500 mg  Status:  Discontinued        500 mg Oral Daily at bedtime 02/28/22 1154 03/01/22 0940   02/27/22 1000  ceFEPIme (MAXIPIME) 2 g in sodium chloride 0.9 % 100 mL IVPB  Status:  Discontinued        2 g 200 mL/hr over 30 Minutes Intravenous Every 12 hours 02/27/22 0856 03/03/22 0951   02/26/22 2200  azithromycin (ZITHROMAX) 500 mg in sodium chloride 0.9 % 250 mL IVPB  Status:  Discontinued        500 mg 250 mL/hr over 60 Minutes Intravenous Every 24 hours 02/26/22 0900 02/28/22 1153   02/26/22 1800  cefTRIAXone (ROCEPHIN) 2 g in sodium chloride 0.9 % 100 mL IVPB  Status:  Discontinued        2 g 200 mL/hr over 30 Minutes Intravenous Every 24 hours 02/26/22 0900 02/27/22 0838   02/25/22 2015  cefTRIAXone (ROCEPHIN) 1 g in sodium chloride 0.9 % 100 mL IVPB        1 g 200 mL/hr over 30 Minutes Intravenous  Once 02/25/22 2011 02/25/22 2059   02/25/22 2015  azithromycin (ZITHROMAX) 500 mg in sodium chloride 0.9 % 250 mL IVPB        500 mg 250 mL/hr over 60 Minutes Intravenous  Once 02/25/22 2011 02/25/22 2206        Subjective: Seen and examined at  bedside still requiring quite a bit of oxygen but states that she is feeling okay and not short of breath.  Pulmonary attempted to wean her further but she continues to require quite a bit of oxygen.  Chest x-ray showed worsening and she is continue to be diuresed.  She denies any nausea or vomiting.  Feels okay.  No other concerns or complaints at this time.  Objective: Vitals:   03/06/22 1000 03/06/22 1135 03/06/22 1200 03/06/22 1431  BP: (!) 152/87  (!) 148/85   Pulse: (!) 123  (!) 117   Resp: 19  (!) 23   Temp:  98.2 F (36.8 C)    TempSrc:  Oral    SpO2: 94%  92% 95%  Weight:      Height:  Intake/Output Summary (Last 24 hours) at 03/06/2022 1611 Last data filed at 03/06/2022 1300 Gross per 24 hour  Intake --  Output 900 ml  Net -900 ml   Filed Weights   03/02/22 0500 03/03/22 0500 03/06/22 0500  Weight: 53.5 kg 53 kg 51.9 kg   Examination: Physical Exam:  Constitutional: Chronically ill-appearing Caucasian female currently no acute distress Respiratory: Diminished to auscultation bilaterally with coarse breath sounds and has some rhonchi and crackles.  Has increased respiratory effort but is not really tachypneic.  Continues with heated high flow nasal cannula 50 L Cardiovascular: RRR, no murmurs / rubs / gallops. S1 and S2 auscultated.  Has some trace lower extremity edema Abdomen: Soft, non-tender, non-distended. Bowel sounds positive.  GU: Deferred. Musculoskeletal: No clubbing / cyanosis of digits/nails. No joint deformity upper and lower extremities.  Skin: No rashes, lesions, ulcers on limited skin evaluation. No induration; Warm and dry.  Neurologic: CN 2-12 grossly intact with no focal deficits.  Romberg sign and cerebellar reflexes not assessed.  Psychiatric: Normal judgment and insight. Alert and oriented x 3. Normal mood and appropriate affect.   Data Reviewed: I have personally reviewed following labs and imaging studies  CBC: Recent Labs  Lab  03/02/22 0433 03/03/22 0625 03/04/22 0558 03/05/22 0440 03/06/22 0638  WBC 13.2* 12.5* 13.4* 12.5* 15.4*  NEUTROABS 11.6* 9.3* 10.1* 9.8* 12.2*  HGB 9.8* 10.6* 10.4* 11.2* 12.0  HCT 31.7* 34.6* 34.0* 35.7* 38.6  MCV 93.0 94.8 95.2 94.4 94.8  PLT 349 428* 440* 383 921*   Basic Metabolic Panel: Recent Labs  Lab 03/01/22 0500 03/02/22 0433 03/03/22 0625 03/04/22 0558 03/05/22 0440 03/06/22 0638  NA 138 139 141 142 141 138  K 4.3 4.6 3.5 4.0 3.9 4.0  CL 101 102 102 107 100 99  CO2 28 28 29 29  32 31  GLUCOSE 185* 152* 70 63* 73 61*  BUN 26* 29* 30* 22 22 33*  CREATININE 0.62 0.48 0.62 0.47 0.70 0.67  CALCIUM 8.0* 7.8* 7.9* 8.5* 8.9 9.0  MG 2.1  --  2.0 2.4 2.3 2.0  PHOS  --   --  2.6 2.2* 3.4 4.5   GFR: Estimated Creatinine Clearance: 45.2 mL/min (by C-G formula based on SCr of 0.67 mg/dL). Liver Function Tests: Recent Labs  Lab 03/02/22 0433 03/03/22 0625 03/04/22 0558 03/05/22 0440 03/06/22 0638  AST 57* 48* 42* 48* 63*  ALT 56* 65* 65* 76* 94*  ALKPHOS 58 64 64 66 68  BILITOT 0.6 0.6 0.6 0.6 0.6  PROT 7.2 7.4 7.2 7.6 7.5  ALBUMIN 2.7* 2.8* 2.6* 2.9* 3.1*   No results for input(s): "LIPASE", "AMYLASE" in the last 168 hours. No results for input(s): "AMMONIA" in the last 168 hours. Coagulation Profile: No results for input(s): "INR", "PROTIME" in the last 168 hours. Cardiac Enzymes: No results for input(s): "CKTOTAL", "CKMB", "CKMBINDEX", "TROPONINI" in the last 168 hours. BNP (last 3 results) No results for input(s): "PROBNP" in the last 8760 hours. HbA1C: No results for input(s): "HGBA1C" in the last 72 hours. CBG: Recent Labs  Lab 03/05/22 2141 03/06/22 0818 03/06/22 1135 03/06/22 1139 03/06/22 1235  GLUCAP 91 76 504* 454* 481*   Lipid Profile: No results for input(s): "CHOL", "HDL", "LDLCALC", "TRIG", "CHOLHDL", "LDLDIRECT" in the last 72 hours. Thyroid Function Tests: No results for input(s): "TSH", "T4TOTAL", "FREET4", "T3FREE", "THYROIDAB"  in the last 72 hours. Anemia Panel: Recent Labs    03/05/22 0440  VITAMINB12 445  FOLATE 6.2  FERRITIN 1,198*  TIBC 239*  IRON 90  RETICCTPCT 3.6*   Sepsis Labs: Recent Labs  Lab 03/02/22 0814 03/04/22 0558  PROCALCITON <0.10 <0.10    Recent Results (from the past 240 hour(s))  Resp Panel by RT-PCR (Flu A&B, Covid) Anterior Nasal Swab     Status: None   Collection Time: 02/25/22  4:33 PM   Specimen: Anterior Nasal Swab  Result Value Ref Range Status   SARS Coronavirus 2 by RT PCR NEGATIVE NEGATIVE Final    Comment: (NOTE) SARS-CoV-2 target nucleic acids are NOT DETECTED.  The SARS-CoV-2 RNA is generally detectable in upper respiratory specimens during the acute phase of infection. The lowest concentration of SARS-CoV-2 viral copies this assay can detect is 138 copies/mL. A negative result does not preclude SARS-Cov-2 infection and should not be used as the sole basis for treatment or other patient management decisions. A negative result may occur with  improper specimen collection/handling, submission of specimen other than nasopharyngeal swab, presence of viral mutation(s) within the areas targeted by this assay, and inadequate number of viral copies(<138 copies/mL). A negative result must be combined with clinical observations, patient history, and epidemiological information. The expected result is Negative.  Fact Sheet for Patients:  EntrepreneurPulse.com.au  Fact Sheet for Healthcare Providers:  IncredibleEmployment.be  This test is no t yet approved or cleared by the [place] FDA and  has been authorized for detection and/or diagnosis of SARS-CoV-2 by FDA under an Emergency Use Authorization (EUA). This EUA will remain  in effect (meaning this test can be used) for the duration of the COVID-19 declaration under Section 564(b)(1) of the Act, 21 U.S.C.section 360bbb-3(b)(1), unless the authorization is terminated  or  revoked sooner.       Influenza A by PCR NEGATIVE NEGATIVE Final   Influenza B by PCR NEGATIVE NEGATIVE Final    Comment: (NOTE) The Xpert Xpress SARS-CoV-2/FLU/RSV plus assay is intended as an aid in the diagnosis of influenza from Nasopharyngeal swab specimens and should not be used as a sole basis for treatment. Nasal washings and aspirates are unacceptable for Xpert Xpress SARS-CoV-2/FLU/RSV testing.  Fact Sheet for Patients: EntrepreneurPulse.com.au  Fact Sheet for Healthcare Providers: IncredibleEmployment.be  This test is not yet approved or cleared by the [place] FDA and has been authorized for detection and/or diagnosis of SARS-CoV-2 by FDA under an Emergency Use Authorization (EUA). This EUA will remain in effect (meaning this test can be used) for the duration of the COVID-19 declaration under Section 564(b)(1) of the Act, 21 U.S.C. section 360bbb-3(b)(1), unless the authorization is terminated or revoked.  Performed at [facility], [address] [place], [place] [address]   Culture, blood (Routine X 2) w Reflex to ID Panel     Status: None   Collection Time: 02/25/22  4:57 PM   Specimen: BLOOD  Result Value Ref Range Status   Specimen Description   Final    BLOOD BOTTLES DRAWN AEROBIC AND ANAEROBIC LEFT ANTECUBITAL Performed at [facility], [address] [place], [place] [address]    Special Requests   Final    Blood Culture adequate volume Performed at [facility], [address] [place], [place] [address]    Culture   Final    NO GROWTH 5 DAYS Performed at [facility], [place] [address] [place], [place] [address]    Report Status 03/02/2022 FINAL  Final  Respiratory (~20 pathogens) panel by PCR  Status: None   Collection Time: 02/25/22  9:09 PM   Specimen: Nasopharyngeal Swab; Respiratory  Result Value Ref Range Status   Adenovirus NOT DETECTED NOT DETECTED Final    Coronavirus 229E NOT DETECTED NOT DETECTED Final    Comment: (NOTE) The Coronavirus on the Respiratory Panel, DOES NOT test for the novel  Coronavirus (2019 nCoV)    Coronavirus HKU1 NOT DETECTED NOT DETECTED Final   Coronavirus NL63 NOT DETECTED NOT DETECTED Final   Coronavirus OC43 NOT DETECTED NOT DETECTED Final   Metapneumovirus NOT DETECTED NOT DETECTED Final   Rhinovirus / Enterovirus NOT DETECTED NOT DETECTED Final   Influenza A NOT DETECTED NOT DETECTED Final   Influenza B NOT DETECTED NOT DETECTED Final   Parainfluenza Virus 1 NOT DETECTED NOT DETECTED Final   Parainfluenza Virus 2 NOT DETECTED NOT DETECTED Final   Parainfluenza Virus 3 NOT DETECTED NOT DETECTED Final   Parainfluenza Virus 4 NOT DETECTED NOT DETECTED Final   Respiratory Syncytial Virus NOT DETECTED NOT DETECTED Final   Bordetella pertussis NOT DETECTED NOT DETECTED Final   Bordetella Parapertussis NOT DETECTED NOT DETECTED Final   Chlamydophila pneumoniae NOT DETECTED NOT DETECTED Final   Mycoplasma pneumoniae NOT DETECTED NOT DETECTED Final    Comment: Performed at [facility], [place]. [address], [place], [place] [address]  Culture, blood (Routine X 2) w Reflex to ID Panel     Status: None   Collection Time: 02/26/22 10:27 AM   Specimen: BLOOD  Result Value Ref Range Status   Specimen Description   Final    BLOOD BLOOD LEFT HAND Performed at [facility] [address] [place], [place] [address]    Special Requests   Final    BOTTLES DRAWN AEROBIC AND ANAEROBIC Blood Culture adequate volume Performed at [facility] [address] [place], [place] [address]    Culture   Final    NO GROWTH 5 DAYS Performed at [facility], [place] [address], [place], [place] [address]    Report Status 03/03/2022 FINAL  Final  Urine Culture     Status: None   Collection Time: 02/27/22  8:31 AM   Specimen: Urine, Clean Catch  Result Value Ref Range Status   Specimen  Description   Final    URINE, CLEAN CATCH Performed at [facility], [place] [address], [place], [place] [address]    Special Requests   Final    NONE Performed at [facility], [place] [address], [place], [place] [address]    Culture   Final    NO GROWTH Performed at [facility], [place] [address] [place], [place] [address]    Report Status 02/28/2022 FINAL  Final  MRSA Next Gen by PCR, Nasal     Status: None   Collection Time: 02/28/22  7:13 PM   Specimen: Nasal Mucosa; Nasal Swab  Result Value Ref Range Status   MRSA by PCR Next Gen NOT DETECTED NOT DETECTED Final    Comment: (NOTE) The GeneXpert MRSA Assay (FDA approved for NASAL specimens only), is one component of a comprehensive MRSA colonization surveillance program. It is not intended to diagnose MRSA infection nor to guide or monitor treatment for MRSA infections. Test performance is not FDA approved in patients less than 27 years old. Performed at [facility], [place] [address] [place], [place] [address]      Radiology Studies: DG CHEST PORT 1 VIEW  Result Date: 03/06/2022 CLINICAL DATA:  14:18 a.m. 0 with shortness of breath. EXAM: PORTABLE CHEST 1 VIEW COMPARISON:  Portable chest yesterday at 5:06 a.m. FINDINGS: 4:16 a.m. Right IJ port catheter again terminates in the right atrium. Multiple overlying monitor wires. Interstitial and patchy hazy opacities of the left-greater-than-right lower lung fields continue to be noted without significant improvement or worsening. The right mid, both upper lung fields remain clear. No pleural effusion is seen. There is mild cardiomegaly but no vascular congestion findings. Stable mediastinum with aortic atherosclerosis. Fusion hardware lower C-spine. IMPRESSION: 1. Interstitial and patchy hazy opacities of the left-greater-than-right lower lung fields continue to be noted without significant improvement or worsening. Probable  multilobar pneumonia. 2. Aortic atherosclerosis. Electronically Signed   By: Telford Nab M.D.   On: 03/06/2022 07:35   DG CHEST PORT 1 VIEW  Result Date: 03/05/2022 CLINICAL DATA:  Shortness of breath. EXAM: PORTABLE CHEST 1 VIEW COMPARISON:  Radiographs 03/04/2022 and 03/02/2022.  CT 02/25/2022. FINDINGS: 0506 hours. There are slightly lower lung volumes. Right IJ Port-A-Cath tip is unchanged at the level of the mid right atrium. The heart size and mediastinal contours are stable. There are increased left-greater-than-right basilar opacities. No evidence of pneumothorax or significant pleural effusion. The bones appear unchanged. Postsurgical changes are present in the cervicothoracic spine. Telemetry leads overlie the chest. IMPRESSION: Lower lung volumes with increased left-greater-than-right basilar opacities, likely atelectasis superimposed on underlying pneumonia. No other significant changes. Electronically Signed   By: Richardean Sale M.D.   On: 03/05/2022 08:12    Scheduled Meds:  bisacodyl  10 mg Rectal Daily   budesonide (PULMICORT) nebulizer solution  0.25 mg Nebulization BID   Chlorhexidine Gluconate Cloth  6 each Topical Daily   enoxaparin (LOVENOX) injection  55 mg Subcutaneous BID   feeding supplement  237 mL Oral BID BM   FLUoxetine  20 mg Oral Daily   fluticasone  2 spray Each Nare Daily   furosemide  40 mg Intravenous BID   insulin aspart  0-20 Units Subcutaneous TID WC   insulin aspart  0-5 Units Subcutaneous QHS   insulin aspart  4 Units Subcutaneous TID WC   insulin glargine-yfgn  30 Units Subcutaneous QHS   ipratropium-albuterol  3 mL Nebulization TID   levothyroxine  125 mcg Oral Q0600   loratadine  10 mg Oral Daily   mouth rinse  15 mL Mouth Rinse 4 times per day   oxyCODONE  10 mg Oral Q12H   pantoprazole  40 mg Oral Daily   polyethylene glycol  17 g Oral BID   predniSONE  50 mg Oral Q breakfast   senna-docusate  2 tablet Oral BID    sulfamethoxazole-trimethoprim  1 tablet Oral Once per day on Mon Wed Fri   Continuous Infusions:   LOS: 8 days   Raiford Noble, DO [facility] Available via Epic secure chat 7am-7pm After these hours, please refer to coverage provider listed on [email or web address] 03/06/2022, 4:11 PM

## 2022-03-06 NOTE — TOC Initial Note (Signed)
Transition of Care [facility]) - Initial/Assessment Note    Patient Details  Name: [name] MRN: [id number] Date of Birth: [birth year]  Transition of [facility]) CM/SW Contact:    Henrietta Dine, RN Phone Number: 03/06/2022, 10:22 AM  Clinical Narrative:                 [facility] consult for HHPT; spoke with pt in room; she says her POC is Lynleigh Kovack (husband) [phone]; the pt is from home and plans to return at d/c; the pt says she wears reading glasses and she is supposed to wear a left hearing aid but it hurts her ear; she also says she does not have [place] services or DME; the pt says she has transportation home; she agrees to [facility] and does not have an agency preference; contacted [place] at [place] and the agency can provide service; pt notified and [place] [facility] in followup provider section of d/c summary; TOC will con't to follow.  Expected Discharge Plan: [place] Barriers to Discharge: Continued Medical Work up   Patient Goals and CMS Choice Patient states their goals for this hospitalization and ongoing recovery are:: home      Expected Discharge Plan and Services Expected Discharge Plan: [place]   Discharge Planning Services: CM Consult   Living arrangements for the past 2 months: Single Family Home                           HH Arranged: PT          Prior Living Arrangements/Services Living arrangements for the past 2 months: Single Family Home Lives with:: Spouse Patient language and need for interpreter reviewed:: Yes Do you feel safe going back to the place where you live?: Yes      Need for Family Participation in Patient Care: Yes (Comment) Care giver support system in place?: Yes (comment)   Criminal Activity/Legal Involvement Pertinent to Current Situation/Hospitalization: No - Comment as needed  Activities of Daily Living Home Assistive Devices/Equipment: Eyeglasses ADL Screening (condition at time  of admission) Patient's cognitive ability adequate to safely complete daily activities?: Yes Is the patient deaf or have difficulty hearing?: No Does the patient have difficulty seeing, even when wearing glasses/contacts?: No Does the patient have difficulty concentrating, remembering, or making decisions?: No Patient able to express need for assistance with ADLs?: Yes Does the patient have difficulty dressing or bathing?: No Independently performs ADLs?: Yes (appropriate for developmental age) Does the patient have difficulty walking or climbing stairs?: No Weakness of Legs: None Weakness of Arms/Hands: None  Permission Sought/Granted Permission sought to share information with : Case Manager Permission granted to share information with : Yes, Verbal Permission Granted  Share Information with NAME: Lenor Coffin, RN, CM           Emotional Assessment Appearance:: Appears stated age Attitude/Demeanor/Rapport: Gracious Affect (typically observed): Accepting Orientation: : Oriented to Self, Oriented to Place, Oriented to  Time, Oriented to Situation Alcohol / Substance Use: Not Applicable Psych Involvement: Outpatient Provider  Admission diagnosis:  Viral syndrome [B34.9] Hypoxia [R09.02] Acute respiratory failure with hypoxia (HCC) [J96.01] Multifocal pneumonia [J18.9] Single subsegmental pulmonary embolism without acute cor pulmonale (HCC) [I26.93] Patient Active Problem List   Diagnosis Date Noted   Subsegmental pulmonary embolism without acute cor pulmonale ([place]) 02/26/2022   Hyponatremia 02/26/2022   Other constipation 02/26/2022   Hypokalemia 02/26/2022   Hypomagnesemia  02/26/2022   Hypophosphatemia 02/26/2022   Anemia 02/26/2022   Acute respiratory failure with hypoxia ([place]) 02/25/2022   Gastroenteritis 02/20/2022   History of tobacco use 06/25/2021   Small cell lung cancer, left upper lobe ([place]) 06/06/2021   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[place])  06/06/2021   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[place]) 06/06/2021   Lung mass 05/28/2021   Goals of care, counseling/discussion 05/23/2021   Abnormal MRI, spine 05/08/2021   Type 2 diabetes mellitus with diabetic peripheral angiopathy without gangrene, without long-term current use of insulin ([place]) 02/21/2021   Anxiety 02/28/2019   Hx of colonic polyps ssp and adenoma 03/06/2018   Sinus headache 05/25/2017   Headache above the eye region 05/25/2017   Obstructive sleep apnea treated with continuous positive airway pressure (CPAP) 01/28/2017   Urinary frequency 11/23/2016   Diabetes mellitus, type II, insulin dependent ([place]) 11/23/2016   Chronic pansinusitis 10/12/2016   Preventative health care 02/07/2016   Cigarette nicotine dependence with nicotine-induced disorder 07/22/2015   Hypersomnia with sleep apnea 07/22/2015   Snoring 07/22/2015   S/P UPPP (uvulopalatopharyngoplasty) 07/22/2015   Severe frontal headaches 07/22/2015   Chronic ethmoidal sinusitis 07/22/2015   OSA (obstructive sleep apnea) 07/08/2015   Cervical osteoarthritis 11/23/2014   Degeneration of intervertebral disc of cervical region 11/23/2014   Cervical pain 11/23/2014   OE (otitis externa) 12/09/2012   Hypothyroidism 06/04/2010   Type 2 diabetes mellitus without complication, with long-term current use of insulin ([place]) 06/04/2010   Hyperlipidemia LDL goal <70 06/04/2010   OSTEOARTHRITIS 06/04/2010   PCP:  [name], DO Pharmacy:   [id number] - [place], [place] - [address] [place] [address] Phone: [phone] Fax: [place] [address], [place] [address] Phone: [phone] Fax: [phone]     Social Determinants of Health (SDOH) Interventions    Readmission Risk Interventions     No data to display

## 2022-03-07 ENCOUNTER — Inpatient Hospital Stay (HOSPITAL_COMMUNITY): Payer: PPO

## 2022-03-07 DIAGNOSIS — I2693 Single subsegmental pulmonary embolism without acute cor pulmonale: Secondary | ICD-10-CM | POA: Diagnosis not present

## 2022-03-07 DIAGNOSIS — J9601 Acute respiratory failure with hypoxia: Secondary | ICD-10-CM | POA: Diagnosis not present

## 2022-03-07 DIAGNOSIS — E119 Type 2 diabetes mellitus without complications: Secondary | ICD-10-CM | POA: Diagnosis not present

## 2022-03-07 DIAGNOSIS — E039 Hypothyroidism, unspecified: Secondary | ICD-10-CM | POA: Diagnosis not present

## 2022-03-07 LAB — CBC WITH DIFFERENTIAL/PLATELET
Abs Immature Granulocytes: 0.23 10*3/uL — ABNORMAL HIGH (ref 0.00–0.07)
Basophils Absolute: 0 10*3/uL (ref 0.0–0.1)
Basophils Relative: 0 %
Eosinophils Absolute: 0.2 10*3/uL (ref 0.0–0.5)
Eosinophils Relative: 1 %
HCT: 38.9 % (ref 36.0–46.0)
Hemoglobin: 12.3 g/dL (ref 12.0–15.0)
Immature Granulocytes: 2 %
Lymphocytes Relative: 9 %
Lymphs Abs: 1.4 10*3/uL (ref 0.7–4.0)
MCH: 29.6 pg (ref 26.0–34.0)
MCHC: 31.6 g/dL (ref 30.0–36.0)
MCV: 93.5 fL (ref 80.0–100.0)
Monocytes Absolute: 1.1 10*3/uL — ABNORMAL HIGH (ref 0.1–1.0)
Monocytes Relative: 7 %
Neutro Abs: 12.7 10*3/uL — ABNORMAL HIGH (ref 1.7–7.7)
Neutrophils Relative %: 81 %
Platelets: 420 10*3/uL — ABNORMAL HIGH (ref 150–400)
RBC: 4.16 MIL/uL (ref 3.87–5.11)
RDW: 13.9 % (ref 11.5–15.5)
WBC: 15.6 10*3/uL — ABNORMAL HIGH (ref 4.0–10.5)
nRBC: 0 % (ref 0.0–0.2)

## 2022-03-07 LAB — COMPREHENSIVE METABOLIC PANEL
ALT: 88 U/L — ABNORMAL HIGH (ref 0–44)
AST: 41 U/L (ref 15–41)
Albumin: 3.4 g/dL — ABNORMAL LOW (ref 3.5–5.0)
Alkaline Phosphatase: 71 U/L (ref 38–126)
Anion gap: 11 (ref 5–15)
BUN: 38 mg/dL — ABNORMAL HIGH (ref 8–23)
CO2: 32 mmol/L (ref 22–32)
Calcium: 9.5 mg/dL (ref 8.9–10.3)
Chloride: 97 mmol/L — ABNORMAL LOW (ref 98–111)
Creatinine, Ser: 0.82 mg/dL (ref 0.44–1.00)
GFR, Estimated: >60 mL/min (ref >60)
Glucose, Bld: 80 mg/dL (ref 70–99)
Potassium: 3.6 mmol/L (ref 3.5–5.1)
Sodium: 140 mmol/L (ref 135–145)
Total Bilirubin: 0.5 mg/dL (ref 0.3–1.2)
Total Protein: 8 g/dL (ref 6.5–8.1)

## 2022-03-07 LAB — GLUCOSE, CAPILLARY
Glucose-Capillary: 79 mg/dL (ref 70–99)
Glucose-Capillary: 79 mg/dL (ref 70–99)
Glucose-Capillary: 121 mg/dL — ABNORMAL HIGH (ref 70–99)
Glucose-Capillary: 404 mg/dL — ABNORMAL HIGH (ref 70–99)
Glucose-Capillary: 390 mg/dL — ABNORMAL HIGH (ref 70–99)
Glucose-Capillary: 270 mg/dL — ABNORMAL HIGH (ref 70–99)

## 2022-03-07 LAB — HEPATITIS PANEL, ACUTE
HCV Ab: NONREACTIVE
Hep A IgM: NONREACTIVE
Hep B C IgM: NONREACTIVE
Hepatitis B Surface Ag: NONREACTIVE

## 2022-03-07 LAB — PHOSPHORUS: Phosphorus: 5.1 mg/dL — ABNORMAL HIGH (ref 2.5–4.6)

## 2022-03-07 LAB — MAGNESIUM: Magnesium: 2.3 mg/dL (ref 1.7–2.4)

## 2022-03-07 MED ORDER — FLUCONAZOLE 100 MG PO TABS
100.0000 mg | ORAL_TABLET | Freq: Every day | ORAL | Status: DC
  Administered 2022-03-07 – 2022-03-12 (×6): 100 mg via ORAL
  Filled 2022-03-07 (×6): qty 1

## 2022-03-07 MED ORDER — ENOXAPARIN SODIUM 60 MG/0.6ML IJ SOSY
50.0000 mg | PREFILLED_SYRINGE | Freq: Two times a day (BID) | INTRAMUSCULAR | Status: DC
  Administered 2022-03-07 – 2022-03-11 (×9): 50 mg via SUBCUTANEOUS
  Filled 2022-03-07 (×9): qty 0.6

## 2022-03-07 MED ORDER — FUROSEMIDE 10 MG/ML IJ SOLN
40.0000 mg | Freq: Every day | INTRAMUSCULAR | Status: DC
  Administered 2022-03-08 – 2022-03-12 (×5): 40 mg via INTRAVENOUS
  Filled 2022-03-07 (×5): qty 4

## 2022-03-07 NOTE — Progress Notes (Signed)
Looks like she is still dealing with this pneumonia.  She is still on oxygen.  She had a chest x-ray today which has not been read yet.  She is on broad spectrum antibiotic.  So far, cultures are negative.  She does have little bit of thrush.  I will start her on some Diflucan to try to help with this.  Her appetite is okay.  She has had no nausea or vomiting.  She had a little bit of diarrhea which might be from the antibiotics.  She continues on the Lovenox for the pulmonary embolism.  Her labs show white cell count 15.6.  Hemoglobin 12.3.  Platelet count 420,000.  The BUN is 38 creatinine 0.82.  Calcium 9.5 with an albumin of 3.4.  There is no pain.  She has had no bleeding.  Vital signs are temperature 98.7.  Pulse 107.  Blood pressure 108/70.  Oxygen saturation 87%.  Her lungs sound okay bilaterally.  She has good air movement bilaterally.  I do not hear any wheezing.  Cardiac exam tachycardic but regular.  No murmurs.  Abdomen is soft.  Extremity shows no clubbing, cyanosis or edema.  Neurological exam is nonfocal.  [name] has the small cell lung cancer.  She has not had treatment for several weeks.  We have not given her any immunotherapy as maintenance.  We will see what the chest x-ray today shows.  I just hate that she still in the ICU.  Everything is sort of at a standstill.  Hopefully, this breathing will improve.  I do appreciate the incredible care she is getting from all the staff in the ICU.  Kelly Haw, MD  Psalm 41:3

## 2022-03-07 NOTE — Progress Notes (Signed)
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  Patient noted prior to admission to have some chills, body aches, fever as high as 101.  Patient noted to have sats of 89% on room air and sent to the ED from urgent care center.  COVID-19 PCR negative, influenza a and B PCR negative, respiratory viral panel negative.  CT chest done with segmental PE, concern for pneumonia.  Patient placed empirically on IV antibiotics, anticoagulation.  PCCM consulted due to concerns for radiation pneumonitis.    **Interim History PCCM/pulmonary has been consulted and recommending physical therapy and checking procalcitonin.  They are recommending resuming Lasix and continue Bactrim for PJP prophylaxis and recommending a slow steroid taper and wean and treating inflammation, PE fluid as well deconditioning.  She remains on quite a bit of oxygen still but pulmonary is attempting to wean and have dropped her to 25 L with 50% and recommends that if she does okay that they can try 12 to 13 L with a regular salter and recommending that she be in the chair during the day and using incentive spirometry hourly.  She is being continued diuresis and anticoagulation as per the hematologist oncologist and they are recommending Lovenox.   Slow to improve and continues to have significant oxygen requirements.  Appreciate pulmonary evaluation and they are planning to taper to prednisone in the coming days recommending continuing current plan of care and following electrolytes and replete as needed.  She is able to be weaned to salter cannula to 13 L from heated high flow nasal cannula and continues to feel better  daily.  Assessment and Plan: * Acute respiratory failure with hypoxia [facility]) -Patient admitted as patient had presented acute respiratory failure with hypoxia.   -Likely multifactorial secondary to pneumonia in a patient with immunosuppression, concern for radiation pneumonitis and also noted to have segmental PE which is unlikely source of hypoxia.   Also concern for possible volume overload as patient noted to have worsening hypoxia on 02/28/2022 required BiPAP and subsequently transferred to the stepdown unit.   -Still with significant O2 requirements.  Currently on high flow nasal cannula with a FiO2 of 80%, flow rate of 30.  Off BiPAP since 03/02/2022.  SpO2: 95 % O2 Flow Rate (L/min): 13 L/min FiO2 (%): 40 % -COVID-19 PCR negative, influenza A and B negative, respiratory viral panel negative.   -CT angiogram chest with pulmonary emboli focal in the right upper lung distal segmental and subsegmental vessels, developing patchy airspace interstitial infiltrates most likely representing multifocal pneumonia, prominent mediastinal and abdominal lymph nodes likely metastatic, multiple bone mets.   -Patient with complaints of chills and fatigue early on in the hospitalization.   -Noted to have a Tmax of 101.6 (02/27/2022). -Fever curve trended down and has been afebrile in the last 2 days. -Urine strep pneumococcus antigen negative.  Urine Legionella antigen negative. -Due to Tmax of 101.6 antibiotic coverage was broadened to IV cefepime and IV azithromycin.  Antibiotics are being continued -IV Rocephin discontinued.   -Patient pancultured.  -Continue scheduled DuoNebs, Claritin, PPI, Pulmicort -Anticoagulation changed from Eliquis to Lovenox per hematology/oncology recommendations.  -PCCM consulted due to concerns for radiation pneumonitis and  patient started on a prednisone taper. -Due to worsening hypoxia CXR obtained concerning for possible volume overload and patient placed on IV Lasix and  had been held due to her dizziness blood pressures but now will be continued and getting IV 40 mg twice daily up until 03/07/2022 where it has been changed to 40 mg daily by pulmonary -IV Solu-Medrol changed to oral prednisone 50 mg daily per PCCM on 03/02/2022. -Patient has been changed from ICS/LABA to Pulmicort nebs, continue diuresis per PCCM and no significant improvement in O2 requirements may need repeat CT chest. -PCT is <0.10 -WBC went from 12.5 -> 13.4 -> 12.5 and now trended up to 15.4 yesterday and today is 15.6 -Strict I's and O's and Daily Weights; Patient is -6.413 liters  -She is on Bactrim for PJP prophylaxis -Continue with DuoNeb 3 MLS 3 times daily as well as budesonide -PCCM following and appreciate further evaluation and continues to get diuresis and prednisone scheduled to 50 and will be tapered in the next coming days -CXR today showed "Similar appearing bibasilar, left greater than right hazy opacities, again favoring multifocal pneumonia in the appropriate clinical setting. Aortic Atherosclerosis"   Subsegmental pulmonary embolism without acute cor pulmonale (HCC) -Small PE. Doubt this would cause cardiac issues.  -Patient initially placed on IV heparin in the ED and has been transitioned to Eliquis. -Patient on BiPAP and due to worsening respiratory status hematology/oncology recommending changing from Eliquis to IV heparin/Lovenox. -Patient transition to full dose Lovenox and will likely be discharged on full dose Lovenox per hematology/oncology recommendations..  -Hematology/oncology following and appreciate further evaluation and recc's   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[place]) -Stable.  -Followed by oncology. Had 4 sessions of XRT in October 2023. Last session on 01-08-2022.  -Last received IV chemo July 2023. -Has a mass noted on the liver at 3.2 cm -Oncology following and recommending Lovenox .   Uncontrolled Type 2  diabetes mellitus without complication, with long-term current use of insulin (HCC) -Stable.  -Hemoglobin A1c 11.3 (02/21/2021) -Repeat hemoglobin A1c 10.4 (02/26/2022). -CBG 70 on morning labs.  CBG noted to be in the 300s overnight..  -Elevated CBG secondary to steroids. -Continued Semglee to 34 units daily but was reduced to 25 units given hypoglycemia yesterday but is now back at 30 but will cut back down to 28, meal coverage NovoLog 4 units 3 times daily with meals.   -Increase Moderate Novolog SSI to resistant scale -CBGs ranging from 79-301 she is very labile   Hypothyroidism -Continue home regimen Synthroid 125 mcg daily.    Normocytic Anemia -Patient with no overt bleeding. -Anemia likely secondary to acute illness and radiation treatment. -Hemoglobin likely is now stable and improved and gone from 10.4/34.0 is now 12.3/38.9 -Anemia panel done and showed an iron level 90, UIBC 149, TIBC 239, saturation ratios of 38%, ferritin level of 1198, folate of 6.2, vitamin B12 445 -Follow H&H. -Transfusion threshold hemoglobin < 7   Hypophosphatemia and is now hypophosphatemic -Replete via Pharmacy Protocol -Phosphorus at 4.5 yesterday and today is 5.1 -Continue to Monitor and Trend and repeat CMP in the AM    Hypomagnesemia -Magnesium at 2.3 -Repeat labs in AM.   Hypokalemia -Repleted, potassium is now 3.6 -Magnesium at 2.3 -Repeat labs in AM.   Other constipation -Continue current bowel regimen of Senokot-S twice daily, MiraLAX twice daily, Dulcolax suppository daily.    Hyponatremia, improved  - TSH 2.597.  -Free T4  1.28.   -  Urine sodium 142, urine potassium 27,  -Concern hyponatremia may be secondary to small cell lung cancer.  -Sodium levels improved currently at 140 this morning.  -Repeat labs in the AM.    Abnormal LFTs -Patient's AST went from 48 -> 42 -> 48 -> 63 -> 41 and ALT is 65 -> 76 -> 94 -> 88 -Continue to Monitor and trend and repeat CMP in the a.m.  and since trending up will  obtain a right upper quadrant ultrasound as well as an acute hepatitis panel -Acute hepatitis panel was negative and right upper quadrant ultrasound done and showed "A masslike hypoechoic region is identified in the right hepatic lobe measuring up to 3.2 cm. A mass is not excluded.  Recommend an MRI of the abdomen with and without  contrast for better evaluation of this location. The gallbladder and visualized common bile duct are unremarkable." -Will currently defer her MRI of the abdomen with and without contrast given her respiratory status and known history of cancer -Continue monitor and trend and repeat CMP in the a.m.   Thrombocytosis -Likely in the setting of above -Patient platelet count went from 383 and is now 439 -Repeat CBC in a.m.   DVT prophylaxis: SCDs Start: 02/26/22 0404    Code Status: DNR Family Communication: No family currently at bedside  Disposition Plan:  Level of care: Stepdown Status is: Inpatient Remains inpatient appropriate because: Remains quite a bit on oxygen and will continue to wean further.  Was able to be weaned from a heated high flow nasal cannula to a softer today by pulmonary.  She denies any chest pain and currently does not feel short of breath.  No other concerns or complaints at this time.   Consultants:  Pulmonary/PCCM Medical Oncology  Procedures:  As delineated as above  Antimicrobials:  Anti-infectives (From admission, onward)    Start     Dose/Rate Route Frequency Ordered Stop   03/07/22 1000  fluconazole (DIFLUCAN) tablet 100 mg        100 mg Oral Daily 03/07/22 0756     03/04/22 0900  sulfamethoxazole-trimethoprim (BACTRIM DS) 800-160 MG per tablet 1 tablet        1 tablet Oral Once per day on Mon Wed Fri 03/03/22 0952     03/01/22 2200  azithromycin (ZITHROMAX) 500 mg in sodium chloride 0.9 % 250 mL IVPB        500 mg 250 mL/hr over 60 Minutes Intravenous Every 24 hours 03/01/22 0940 03/02/22 2320    02/28/22 2200  azithromycin (ZITHROMAX) tablet 500 mg  Status:  Discontinued        500 mg Oral Daily at bedtime 02/28/22 1154 03/01/22 0940   02/27/22 1000  ceFEPIme (MAXIPIME) 2 g in sodium chloride 0.9 % 100 mL IVPB  Status:  Discontinued        2 g 200 mL/hr over 30 Minutes Intravenous Every 12 hours 02/27/22 0856 03/03/22 0951   02/26/22 2200  azithromycin (ZITHROMAX) 500 mg in sodium chloride 0.9 % 250 mL IVPB  Status:  Discontinued        500 mg 250 mL/hr over 60 Minutes Intravenous Every 24 hours 02/26/22 0900 02/28/22 1153   02/26/22 1800  cefTRIAXone (ROCEPHIN) 2 g in sodium chloride 0.9 % 100 mL IVPB  Status:  Discontinued        2 g 200 mL/hr over 30 Minutes Intravenous Every 24 hours 02/26/22 0900 02/27/22 0838   02/25/22 2015  cefTRIAXone (ROCEPHIN) 1 g in  sodium chloride 0.9 % 100 mL IVPB        1 g 200 mL/hr over 30 Minutes Intravenous  Once 02/25/22 2011 02/25/22 2059   02/25/22 2015  azithromycin (ZITHROMAX) 500 mg in sodium chloride 0.9 % 250 mL IVPB        500 mg 250 mL/hr over 60 Minutes Intravenous  Once 02/25/22 2011 02/25/22 2206       Subjective: Seen and examined at bedside and she was doing okay and states she is doing better.  Respiratory status is improving slowly and oxygen is weaning.  Had no complaints.  Denies any nausea or vomiting.  No other concerns or complaints this time.  Objective: Vitals:   03/07/22 1300 03/07/22 1314 03/07/22 1400 03/07/22 1439  BP:   (!) 99/55   Pulse: (!) 110  (!) 113   Resp:      Temp:  97.9 F (36.6 C)    TempSrc:  Oral    SpO2: 94%  95% 95%  Weight:      Height:        Intake/Output Summary (Last 24 hours) at 03/07/2022 1530 Last data filed at 03/07/2022 0800 Gross per 24 hour  Intake --  Output 600 ml  Net -600 ml   Filed Weights   03/03/22 0500 03/06/22 0500 03/07/22 0647  Weight: 53 kg 51.9 kg 50.3 kg   Examination: Physical Exam:  Constitutional: WN/WD elderly chronically ill-appearing Caucasian  female, in no acute distress Respiratory: Diminished to auscultation bilaterally with coarse breath sounds and wearing heated high flow nasal cannula with some slight crackles on mild rhonchi., no wheezing, rales, rhonchi or crackles.  Respiratory status appears stable on this regimen and she is not tachypneic or using accessory muscles to breathe Cardiovascular: RRR, no murmurs / rubs / gallops. S1 and S2 auscultated.  Abdomen: Soft, non-tender, non-distended. Bowel sounds positive.  GU: Deferred. Musculoskeletal: No clubbing / cyanosis of digits/nails. No joint deformity upper and lower extremities.  Skin: No rashes, lesions, ulcers limited skin evaluation. No induration; Warm and dry.  Neurologic: CN 2-12 grossly intact with no focal deficits. Romberg sign and cerebellar reflexes not assessed.  Psychiatric: Normal judgment and insight. Alert and oriented x 3. Normal mood and appropriate affect.   Data Reviewed: I have personally reviewed following labs and imaging studies  CBC: Recent Labs  Lab 03/03/22 0625 03/04/22 0558 03/05/22 0440 03/06/22 0638 03/07/22 0555  WBC 12.5* 13.4* 12.5* 15.4* 15.6*  NEUTROABS 9.3* 10.1* 9.8* 12.2* 12.7*  HGB 10.6* 10.4* 11.2* 12.0 12.3  HCT 34.6* 34.0* 35.7* 38.6 38.9  MCV 94.8 95.2 94.4 94.8 93.5  PLT 428* 440* 383 439* 580*   Basic Metabolic Panel: Recent Labs  Lab 03/03/22 0625 03/04/22 0558 03/05/22 0440 03/06/22 0638 03/07/22 0555  NA 141 142 141 138 140  K 3.5 4.0 3.9 4.0 3.6  CL 102 107 100 99 97*  CO2 29 29 32 31 32  GLUCOSE 70 63* 73 61* 80  BUN 30* 22 22 33* 38*  CREATININE 0.62 0.47 0.70 0.67 0.82  CALCIUM 7.9* 8.5* 8.9 9.0 9.5  MG 2.0 2.4 2.3 2.0 2.3  PHOS 2.6 2.2* 3.4 4.5 5.1*   GFR: Estimated Creatinine Clearance: 44.1 mL/min (by C-G formula based on SCr of 0.82 mg/dL). Liver Function Tests: Recent Labs  Lab 03/03/22 0625 03/04/22 0558 03/05/22 0440 03/06/22 0638 03/07/22 0555  AST 48* 42* 48* 63* 41  ALT 65*  65* 76* 94* 88*  ALKPHOS 64 64 66  68 71  BILITOT 0.6 0.6 0.6 0.6 0.5  PROT 7.4 7.2 7.6 7.5 8.0  ALBUMIN 2.8* 2.6* 2.9* 3.1* 3.4*   No results for input(s): "LIPASE", "AMYLASE" in the last 168 hours. No results for input(s): "AMMONIA" in the last 168 hours. Coagulation Profile: No results for input(s): "INR", "PROTIME" in the last 168 hours. Cardiac Enzymes: No results for input(s): "CKTOTAL", "CKMB", "CKMBINDEX", "TROPONINI" in the last 168 hours. BNP (last 3 results) No results for input(s): "PROBNP" in the last 8760 hours. HbA1C: No results for input(s): "HGBA1C" in the last 72 hours. CBG: Recent Labs  Lab 03/06/22 1618 03/06/22 2050 03/07/22 0605 03/07/22 0751 03/07/22 1154  GLUCAP 301* 164* 79 79 121*   Lipid Profile: No results for input(s): "CHOL", "HDL", "LDLCALC", "TRIG", "CHOLHDL", "LDLDIRECT" in the last 72 hours. Thyroid Function Tests: No results for input(s): "TSH", "T4TOTAL", "FREET4", "T3FREE", "THYROIDAB" in the last 72 hours. Anemia Panel: Recent Labs    03/05/22 0440  VITAMINB12 445  FOLATE 6.2  FERRITIN 1,198*  TIBC 239*  IRON 90  RETICCTPCT 3.6*   Sepsis Labs: Recent Labs  Lab 03/02/22 0814 03/04/22 0558  PROCALCITON <0.10 <0.10    Recent Results (from the past 240 hour(s))  Resp Panel by RT-PCR (Flu A&B, Covid) Anterior Nasal Swab     Status: None   Collection Time: 02/25/22  4:33 PM   Specimen: Anterior Nasal Swab  Result Value Ref Range Status   SARS Coronavirus 2 by RT PCR NEGATIVE NEGATIVE Final    Comment: (NOTE) SARS-CoV-2 target nucleic acids are NOT DETECTED.  The SARS-CoV-2 RNA is generally detectable in upper respiratory specimens during the acute phase of infection. The lowest concentration of SARS-CoV-2 viral copies this assay can detect is 138 copies/mL. A negative result does not preclude SARS-Cov-2 infection and should not be used as the sole basis for treatment or other patient management decisions. A negative result  may occur with  improper specimen collection/handling, submission of specimen other than nasopharyngeal swab, presence of viral mutation(s) within the areas targeted by this assay, and inadequate number of viral copies(<138 copies/mL). A negative result must be combined with clinical observations, patient history, and epidemiological information. The expected result is Negative.  Fact Sheet for Patients:  EntrepreneurPulse.com.au  Fact Sheet for Healthcare Providers:  IncredibleEmployment.be  This test is no t yet approved or cleared by the [place] FDA and  has been authorized for detection and/or diagnosis of SARS-CoV-2 by FDA under an Emergency Use Authorization (EUA). This EUA will remain  in effect (meaning this test can be used) for the duration of the COVID-19 declaration under Section 564(b)(1) of the Act, 21 U.S.C.section 360bbb-3(b)(1), unless the authorization is terminated  or revoked sooner.       Influenza A by PCR NEGATIVE NEGATIVE Final   Influenza B by PCR NEGATIVE NEGATIVE Final    Comment: (NOTE) The Xpert Xpress SARS-CoV-2/FLU/RSV plus assay is intended as an aid in the diagnosis of influenza from Nasopharyngeal swab specimens and should not be used as a sole basis for treatment. Nasal washings and aspirates are unacceptable for Xpert Xpress SARS-CoV-2/FLU/RSV testing.  Fact Sheet for Patients: EntrepreneurPulse.com.au  Fact Sheet for Healthcare Providers: IncredibleEmployment.be  This test is not yet approved or cleared by the [place] FDA and has been authorized for detection and/or diagnosis of SARS-CoV-2 by FDA under an Emergency Use Authorization (EUA). This EUA will remain in effect (meaning this test can be used) for the duration of the COVID-19 declaration  under Section 564(b)(1) of the Act, 21 U.S.C. section 360bbb-3(b)(1), unless the authorization is terminated  or revoked.  Performed at [facility], [address] [place], [place] [address]   Culture, blood (Routine X 2) w Reflex to ID Panel     Status: None   Collection Time: 02/25/22  4:57 PM   Specimen: BLOOD  Result Value Ref Range Status   Specimen Description   Final    BLOOD BOTTLES DRAWN AEROBIC AND ANAEROBIC LEFT ANTECUBITAL Performed at [facility], [address] [place], [place] [address]    Special Requests   Final    Blood Culture adequate volume Performed at [facility] [place] [place], [place] [address]    Culture   Final    NO GROWTH 5 DAYS Performed at [facility], [place] [address], [place], [place] [address]    Report Status 03/02/2022 FINAL  Final  Respiratory (~20 pathogens) panel by PCR     Status: None   Collection Time: 02/25/22  9:09 PM   Specimen: Nasopharyngeal Swab; Respiratory  Result Value Ref Range Status   Adenovirus NOT DETECTED NOT DETECTED Final   Coronavirus 229E NOT DETECTED NOT DETECTED Final    Comment: (NOTE) The Coronavirus on the Respiratory Panel, DOES NOT test for the novel  Coronavirus (2019 nCoV)    Coronavirus HKU1 NOT DETECTED NOT DETECTED Final   Coronavirus NL63 NOT DETECTED NOT DETECTED Final   Coronavirus OC43 NOT DETECTED NOT DETECTED Final   Metapneumovirus NOT DETECTED NOT DETECTED Final   Rhinovirus / Enterovirus NOT DETECTED NOT DETECTED Final   Influenza A NOT DETECTED NOT DETECTED Final   Influenza B NOT DETECTED NOT DETECTED Final   Parainfluenza Virus 1 NOT DETECTED NOT DETECTED Final   Parainfluenza Virus 2 NOT DETECTED NOT DETECTED Final   Parainfluenza Virus 3 NOT DETECTED NOT DETECTED Final   Parainfluenza Virus 4 NOT DETECTED NOT DETECTED Final   Respiratory Syncytial Virus NOT DETECTED NOT DETECTED Final   Bordetella pertussis NOT DETECTED NOT DETECTED Final   Bordetella Parapertussis NOT DETECTED NOT DETECTED Final   Chlamydophila pneumoniae NOT DETECTED NOT  DETECTED Final   Mycoplasma pneumoniae NOT DETECTED NOT DETECTED Final    Comment: Performed at [facility], [place] [address] [place], [place] [address]  Culture, blood (Routine X 2) w Reflex to ID Panel     Status: None   Collection Time: 02/26/22 10:27 AM   Specimen: BLOOD  Result Value Ref Range Status   Specimen Description   Final    BLOOD BLOOD LEFT HAND Performed at [place] [address], [place], [place] [address]    Special Requests   Final    BOTTLES DRAWN AEROBIC AND ANAEROBIC Blood Culture adequate volume Performed at [place] [address] [place], [place] [address]    Culture   Final    NO GROWTH 5 DAYS Performed at [facility], [place] [address] [place], [place] [address]    Report Status 03/03/2022 FINAL  Final  Urine Culture     Status: None   Collection Time: 02/27/22  8:31 AM   Specimen: Urine, Clean Catch  Result Value Ref Range Status   Specimen Description   Final    URINE, CLEAN CATCH Performed at [facility], [place] [address], [place], [place] [address]    Special Requests   Final  NONE Performed at [facility], [place] [address], [place], [place] [address]    Culture   Final    NO GROWTH Performed at [facility], [place] [address] [place], [place] [address]    Report Status 02/28/2022 FINAL  Final  MRSA Next Gen by PCR, Nasal     Status: None   Collection Time: 02/28/22  7:13 PM   Specimen: Nasal Mucosa; Nasal Swab  Result Value Ref Range Status   MRSA by PCR Next Gen NOT DETECTED NOT DETECTED Final    Comment: (NOTE) The GeneXpert MRSA Assay (FDA approved for NASAL specimens only), is one component of a comprehensive MRSA colonization surveillance program. It is not intended to diagnose MRSA infection nor to guide or monitor treatment for MRSA infections. Test performance is not FDA approved in patients less than 25 years old. Performed at  [facility], [place] [address] [place], [place] [address]     Radiology Studies: DG CHEST PORT 1 VIEW  Result Date: 03/07/2022 CLINICAL DATA:  [id number] SOB (shortness of breath) [id number] EXAM: PORTABLE CHEST - 1 VIEW COMPARISON:  03/06/2022 FINDINGS: The mediastinal contours are within normal limits. No cardiomegaly. Indwelling right IJ Port-A-Cath in unchanged position with the catheter tip near the cavoatrial junction. Atherosclerotic calcification of the aortic arch similar appearing left greater than right hazy interstitial opacities, most prominent in the lingula. No acute osseous abnormality. Partially visualized cervical fusion hardware, unchanged. Surgical clips in the right upper quadrant, unchanged. IMPRESSION: 1. Similar appearing bibasilar, left greater than right hazy opacities, again favoring multifocal pneumonia in the appropriate clinical setting. 2.  Aortic Atherosclerosis (ICD10-I70.0). Electronically Signed   By: Ruthann Cancer M.D.   On: 03/07/2022 08:07   US Abdomen Limited RUQ (LIVER/GB)  Result Date: 03/06/2022 CLINICAL DATA:  Abnormal LFTs. EXAM: ULTRASOUND ABDOMEN LIMITED RIGHT UPPER QUADRANT COMPARISON:  None Available. FINDINGS: Gallbladder: Surgically absent Common bile duct: Diameter: 3 mm on limited imaging Liver: A masslike hypoechoic region is identified in the right hepatic lobe measuring 2.9 x 3.2 x 2.0 cm. Portal vein is patent on color Doppler imaging with normal direction of blood flow towards the liver. Other: None. IMPRESSION: 1. A masslike hypoechoic region is identified in the right hepatic lobe measuring up to 3.2 cm. A mass is not excluded. Recommend an MRI of the abdomen with and without contrast for better evaluation of this location. 2. The gallbladder and visualized common bile duct are unremarkable. Electronically Signed   By: Dorise Bullion III M.D.   On: 03/06/2022 18:58   DG CHEST PORT 1 VIEW  Result Date: 03/06/2022 CLINICAL DATA:   14:18 a.m. 0 with shortness of breath. EXAM: PORTABLE CHEST 1 VIEW COMPARISON:  Portable chest yesterday at 5:06 a.m. FINDINGS: 4:16 a.m. Right IJ port catheter again terminates in the right atrium. Multiple overlying monitor wires. Interstitial and patchy hazy opacities of the left-greater-than-right lower lung fields continue to be noted without significant improvement or worsening. The right mid, both upper lung fields remain clear. No pleural effusion is seen. There is mild cardiomegaly but no vascular congestion findings. Stable mediastinum with aortic atherosclerosis. Fusion hardware lower C-spine. IMPRESSION: 1. Interstitial and patchy hazy opacities of the left-greater-than-right lower lung fields continue to be noted without significant improvement or worsening. Probable multilobar pneumonia. 2. Aortic atherosclerosis. Electronically Signed   By: Telford Nab M.D.   On: 03/06/2022 07:35    Scheduled Meds:  bisacodyl  10 mg Rectal Daily  budesonide (PULMICORT) nebulizer solution  0.25 mg Nebulization BID   Chlorhexidine Gluconate Cloth  6 each Topical Daily   enoxaparin (LOVENOX) injection  50 mg Subcutaneous BID   feeding supplement  237 mL Oral BID BM   fluconazole  100 mg Oral Daily   FLUoxetine  20 mg Oral Daily   fluticasone  2 spray Each Nare Daily   [START ON 03/08/2022] furosemide  40 mg Intravenous Daily   insulin aspart  0-20 Units Subcutaneous TID WC   insulin aspart  0-5 Units Subcutaneous QHS   insulin aspart  4 Units Subcutaneous TID WC   insulin glargine-yfgn  28 Units Subcutaneous QHS   ipratropium-albuterol  3 mL Nebulization TID   levothyroxine  125 mcg Oral Q0600   loratadine  10 mg Oral Daily   mouth rinse  15 mL Mouth Rinse 4 times per day   oxyCODONE  10 mg Oral Q12H   pantoprazole  40 mg Oral Daily   polyethylene glycol  17 g Oral BID   predniSONE  50 mg Oral Q breakfast   senna-docusate  2 tablet Oral BID   sulfamethoxazole-trimethoprim  1 tablet Oral Once  per day on Mon Wed Fri   Continuous Infusions:   LOS: 9 days   Raiford Noble, DO [facility] Available via Epic secure chat 7am-7pm After these hours, please refer to coverage provider listed on [email or web address] 03/07/2022, 3:30 PM

## 2022-03-07 NOTE — Progress Notes (Signed)
NAME:  [name], MRN:  [id number], DOB:  24-Feb-1951, LOS: 9 ADMISSION DATE:  02/25/2022, CONSULTATION DATE:  02/26/22 REFERRING MD:  Irine Seal, MD CHIEF COMPLAINT:  Acute hypoxemic respiratory failure   History of Present Illness:  71 year old female former smoker (50 pack-years, quit 2018) who presented to the ED and found with hypoxemia. PTA had 1 week URI associated with myalgias , chills, fever and urinary frequency and diarrhea. COVID neg at home. She continued to feel poorly and presented to urgent care with SpO2 89% so was advised to present to the ED. CTA demonstrated small segmental and segmental PE in RUL and patchy infiltrates bilaterally concerning for pneumonia. RVP and COVID/influenza neg. Started on supplemental oxygen, ceftriaxone and azithromycin and heparin. PCCM consulted.  She reports she is active at baseline with no limitations in activity. Followed by Dr. Lamonte Sakai for her diagnosis of lung cancer but did not need any bronchodilators. Last radiation treatment was in October 2023. No immediate issues post-procedure. URI symptoms one week ago as noted above. No wheezing or productive cough.  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 Hiatal hernia. Unchanged lymphadenopathy. Emphysema. Patchy GGO bilaterally. Prior bone mets seen. 11/25 Transferred to SDU for BiPAP for pulmonary edema  Interim History / Subjective:  No acute events overnight Weaned to salter canula from [facility]  She continues to feel better  Objective   Blood pressure 108/70, pulse (!) 107, temperature 98.7 F (37.1  C), temperature source Oral, resp. rate 18, height 4' 11.5" (1.511 m), weight 50.3 kg, SpO2 (!) 87 %.    FiO2 (%):  [40 %] 40 %   Intake/Output Summary (Last 24 hours) at 03/07/2022 0801 Last data filed at 03/07/2022 5400 Gross per 24 hour  Intake --  Output 1050 ml  Net -1050 ml   Filed Weights   03/03/22 0500 03/06/22 0500 03/07/22 0647  Weight: 53 kg 51.9 kg 50.3 kg   Physical Exam:   Resolved Hospital Problem list   N/A  Assessment & Plan:   Acute hypoxemic respiratory failure multifactorial 2/2 ?radiation pneumonitis, ?aspiration, pulmonary embolus, now acute pulmonary edema, some improvement with diuresis +steroids.  Some of this is lack of mobility and derecruitment. Baseline abnormal lung architecture- prior Cts show bronchiectasis in lower lobes. Hx metastatic small cell carcinoma of lung s/p chemoradiation - Last XRT 01/2022, last chemo 10/2021 DM2- poorly controlled with hyperglycemia, exacerbated by steroids  - Continue lasix as tolerated by renal function, changed to 40mg  daily today from BID - Follow electrolytes and replete as needed - Prednisone 50mg /day (1mg /kg), will plan taper in coming days - Bactrim for PJP ppx - Wean HHFNC for sats > 90% - PT consult, up to chair during day.  - Insulin management per primary - AC per heme onc - Needs to be in chair during day and use IS hourly - Will follow, will be slow wean: treat inflammation, PE, fluid, deconditioning  Best Practice (right click and "Reselect all SmartList Selections" daily)   Diet/type: diabetic DVT prophylaxis: lovenox GI prophylaxis: PPI Lines: N/A Foley:  N/A Code Status:  DNR Last date of multidisciplinary goals of care discussion [  11/26 by [place] care time: n/a    Freda Jackson, MD [facility] Office: [phone]   See Amion for personal pager PCCM on call pager [phone] until 7pm. Please call Elink 7p-7a. [phone]

## 2022-03-08 ENCOUNTER — Inpatient Hospital Stay (HOSPITAL_COMMUNITY): Payer: PPO

## 2022-03-08 DIAGNOSIS — I2693 Single subsegmental pulmonary embolism without acute cor pulmonale: Secondary | ICD-10-CM | POA: Diagnosis not present

## 2022-03-08 DIAGNOSIS — E119 Type 2 diabetes mellitus without complications: Secondary | ICD-10-CM | POA: Diagnosis not present

## 2022-03-08 DIAGNOSIS — J9601 Acute respiratory failure with hypoxia: Secondary | ICD-10-CM | POA: Diagnosis not present

## 2022-03-08 DIAGNOSIS — E039 Hypothyroidism, unspecified: Secondary | ICD-10-CM | POA: Diagnosis not present

## 2022-03-08 LAB — GLUCOSE, CAPILLARY
Glucose-Capillary: 72 mg/dL (ref 70–99)
Glucose-Capillary: 210 mg/dL — ABNORMAL HIGH (ref 70–99)
Glucose-Capillary: 326 mg/dL — ABNORMAL HIGH (ref 70–99)
Glucose-Capillary: 215 mg/dL — ABNORMAL HIGH (ref 70–99)

## 2022-03-08 LAB — CBC WITH DIFFERENTIAL/PLATELET
Abs Immature Granulocytes: 0.14 10*3/uL — ABNORMAL HIGH (ref 0.00–0.07)
Basophils Absolute: 0 10*3/uL (ref 0.0–0.1)
Basophils Relative: 0 %
Eosinophils Absolute: 0.1 10*3/uL (ref 0.0–0.5)
Eosinophils Relative: 0 %
HCT: 37 % (ref 36.0–46.0)
Hemoglobin: 11.6 g/dL — ABNORMAL LOW (ref 12.0–15.0)
Immature Granulocytes: 1 %
Lymphocytes Relative: 7 %
Lymphs Abs: 0.9 10*3/uL (ref 0.7–4.0)
MCH: 29.4 pg (ref 26.0–34.0)
MCHC: 31.4 g/dL (ref 30.0–36.0)
MCV: 93.7 fL (ref 80.0–100.0)
Monocytes Absolute: 1.2 10*3/uL — ABNORMAL HIGH (ref 0.1–1.0)
Monocytes Relative: 8 %
Neutro Abs: 12.1 10*3/uL — ABNORMAL HIGH (ref 1.7–7.7)
Neutrophils Relative %: 84 %
Platelets: 349 10*3/uL (ref 150–400)
RBC: 3.95 MIL/uL (ref 3.87–5.11)
RDW: 13.8 % (ref 11.5–15.5)
WBC: 14.4 10*3/uL — ABNORMAL HIGH (ref 4.0–10.5)
nRBC: 0 % (ref 0.0–0.2)

## 2022-03-08 LAB — COMPREHENSIVE METABOLIC PANEL
ALT: 7 U/L (ref 0–44)
AST: 33 U/L (ref 15–41)
Albumin: 3.1 g/dL — ABNORMAL LOW (ref 3.5–5.0)
Alkaline Phosphatase: 69 U/L (ref 38–126)
Anion gap: 10 (ref 5–15)
BUN: 40 mg/dL — ABNORMAL HIGH (ref 8–23)
CO2: 32 mmol/L (ref 22–32)
Calcium: 9.3 mg/dL (ref 8.9–10.3)
Chloride: 98 mmol/L (ref 98–111)
Creatinine, Ser: 0.78 mg/dL (ref 0.44–1.00)
GFR, Estimated: >60 mL/min (ref >60)
Glucose, Bld: 64 mg/dL — ABNORMAL LOW (ref 70–99)
Potassium: 3.7 mmol/L (ref 3.5–5.1)
Sodium: 140 mmol/L (ref 135–145)
Total Bilirubin: 0.6 mg/dL (ref 0.3–1.2)
Total Protein: 7.6 g/dL (ref 6.5–8.1)

## 2022-03-08 LAB — PHOSPHORUS: Phosphorus: 4.3 mg/dL (ref 2.5–4.6)

## 2022-03-08 LAB — MAGNESIUM: Magnesium: 2.4 mg/dL (ref 1.7–2.4)

## 2022-03-08 MED ORDER — PREDNISONE 20 MG PO TABS
40.0000 mg | ORAL_TABLET | Freq: Every day | ORAL | Status: AC
  Administered 2022-03-09 – 2022-03-12 (×4): 40 mg via ORAL
  Filled 2022-03-08 (×4): qty 2

## 2022-03-08 MED ORDER — PREDNISONE 10 MG PO TABS: 10.0000 mg | ORAL_TABLET | Freq: Every day | ORAL | Status: DC

## 2022-03-08 MED ORDER — PREDNISONE 20 MG PO TABS: 30.0000 mg | ORAL_TABLET | Freq: Every day | ORAL | Status: DC

## 2022-03-08 MED ORDER — GADOBUTROL 1 MMOL/ML IV SOLN
5.0000 mL | Freq: Once | INTRAVENOUS | Status: AC | PRN
  Administered 2022-03-08: 5 mL via INTRAVENOUS

## 2022-03-08 MED ORDER — PREDNISONE 5 MG PO TABS: 2.5000 mg | ORAL_TABLET | Freq: Every day | ORAL | Status: DC

## 2022-03-08 MED ORDER — PREDNISONE 20 MG PO TABS: 20.0000 mg | ORAL_TABLET | Freq: Every day | ORAL | Status: DC

## 2022-03-08 MED ORDER — PREDNISONE 5 MG PO TABS: 5.0000 mg | ORAL_TABLET | Freq: Every day | ORAL | Status: DC

## 2022-03-08 NOTE — Progress Notes (Signed)
NAME:  [name], MRN:  [id number], DOB:  [birth year], LOS: 86 ADMISSION DATE:  02/25/2022, CONSULTATION DATE:  02/26/22 REFERRING MD:  Irine Seal, MD CHIEF COMPLAINT:  Acute hypoxemic respiratory failure   History of Present Illness:  71 year old female former smoker (50 pack-years, quit 2018) who presented to the ED and found with hypoxemia. PTA had 1 week URI associated with myalgias , chills, fever and urinary frequency and diarrhea. COVID neg at home. She continued to feel poorly and presented to urgent care with SpO2 89% so was advised to present to the ED. CTA demonstrated small segmental and segmental PE in RUL and patchy infiltrates bilaterally concerning for pneumonia. RVP and COVID/influenza neg. Started on supplemental oxygen, ceftriaxone and azithromycin and heparin. PCCM consulted.  She reports she is active at baseline with no limitations in activity. Followed by Dr. Lamonte Sakai for her diagnosis of lung cancer but did not need any bronchodilators. Last radiation treatment was in October 2023. No immediate issues post-procedure. URI symptoms one week ago as noted above. No wheezing or productive cough.  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 Hiatal hernia. Unchanged lymphadenopathy. Emphysema. Patchy GGO bilaterally. Prior bone mets seen. 11/25 Transferred to SDU for BiPAP for pulmonary edema  Interim History / Subjective:  No acute events overnight Plan for liver MRI today She continues to feel better  Objective   Blood pressure 133/81, pulse 91, temperature 98 F (36.7 C), temperature  source Axillary, resp. rate 12, height 4' 11.5" (1.511 m), weight 50.3 kg, SpO2 98 %.    FiO2 (%):  [40 %] 40 %   Intake/Output Summary (Last 24 hours) at 03/08/2022 0745 Last data filed at 03/07/2022 1815 Gross per 24 hour  Intake --  Output 650 ml  Net -650 ml   Filed Weights   03/03/22 0500 03/06/22 0500 03/07/22 0647  Weight: 53 kg 51.9 kg 50.3 kg   Physical Exam:   Resolved Hospital Problem list   N/A  Assessment & Plan:   Acute hypoxemic respiratory failure multifactorial 2/2 ?radiation pneumonitis, ?aspiration, pulmonary embolus, now acute pulmonary edema, some improvement with diuresis +steroids.  Some of this is lack of mobility and derecruitment. Baseline abnormal lung architecture- prior Cts show bronchiectasis in lower lobes. Hx metastatic small cell carcinoma of lung s/p chemoradiation - Last XRT 01/2022, last chemo 10/2021 DM2- poorly controlled with hyperglycemia, exacerbated by steroids  - Continue lasix as tolerated by renal function, changed to 40mg  daily today from BID - Follow electrolytes and replete as needed - Steroid taper ordered to start tomorrow, 40mg  daily with dose reduction every 4 days there after - Bactrim for PJP ppx - Wean HHFNC for sats > 92% - PT consult, up to chair during day.  - Insulin management per primary - AC per heme onc - Will follow, will be slow wean: treat inflammation, PE, fluid, deconditioning  Best Practice (right click and "Reselect all SmartList Selections" daily)   Per primary  Critical care time: n/a    Freda Jackson, MD [facility] Office: [phone]   See Amion for personal pager PCCM on call pager [phone] until  7pm. Please call Elink 7p-7a. [phone]

## 2022-03-08 NOTE — Progress Notes (Signed)
Patient is on 12 L salter and not on HHFNC at this time

## 2022-03-08 NOTE — Progress Notes (Signed)
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  Patient noted prior to admission to have some chills, body aches, fever as high as 101.  Patient noted to have sats of 89% on room air and sent to the ED from urgent care center.  COVID-19 PCR negative, influenza a and B PCR negative, respiratory viral panel negative.  CT chest done with segmental PE, concern for pneumonia.  Patient placed empirically on IV antibiotics, anticoagulation.  PCCM consulted due to concerns for radiation pneumonitis.    **Interim History PCCM/pulmonary has been consulted and recommending physical therapy and checking procalcitonin.  They are recommending resuming Lasix and continue Bactrim for PJP prophylaxis and recommending a slow steroid taper and wean and treating inflammation, PE fluid as well deconditioning.  She remains on quite a bit of oxygen still but pulmonary is attempting to wean and have dropped her to 25 L with 50% and recommends that if she does okay that they can try 12 to 13 L with a regular salter and recommending that she be in the chair during the day and using incentive spirometry hourly.  She is being continued diuresis and anticoagulation as per the hematologist oncologist and they are recommending Lovenox.   Slow to improve and continues to have significant oxygen requirements.  Appreciate pulmonary evaluation and they are planning to taper to prednisone in the coming days recommending continuing current plan of care and following electrolytes and replete as needed.   She is able to be weaned to salter cannula to 13 L from heated high flow nasal cannula and continues to feel better  daily.  Medical oncology evaluated and she is slowly improving.  They feel that she did not have liver lesion on her PET scan that was done in July so they can obtain an MRI of the liver.  Oxygen continues to improve and she is tolerating salter and is on now 12 L and not on heated high flow nasal cannula at this time.  Per pulmonary they will start a steroid taper tomorrow with 40 mg daily with dose reduction every 4 days thereafter.  Blood sugars are still very liable.  Assessment and Plan: * Acute respiratory failure with hypoxia [facility]) -Patient admitted as patient had presented acute respiratory failure with hypoxia.   -Likely multifactorial secondary to pneumonia in a patient with immunosuppression, concern for radiation pneumonitis and also noted to have segmental PE which is unlikely source of hypoxia.   Also concern for possible volume overload as patient noted to have worsening hypoxia on 02/28/2022 required BiPAP and subsequently transferred to the stepdown unit.   -Still with significant O2 requirements.  Currently on high flow nasal cannula with a FiO2 of 80%, flow rate of 30.  Off BiPAP since 03/02/2022.  SpO2: 95 % O2 Flow Rate (L/min): 13 L/min FiO2 (%): 40 % -COVID-19 PCR negative, influenza A and B negative, respiratory viral panel negative.   -CT angiogram chest with pulmonary emboli focal in the right upper lung distal segmental and subsegmental vessels, developing patchy airspace interstitial infiltrates most likely representing multifocal pneumonia, prominent mediastinal and abdominal lymph nodes likely metastatic, multiple bone mets.   -Patient with complaints of chills  and fatigue early on in the hospitalization.   -Noted to have a Tmax of 101.6 (02/27/2022). -Fever curve trended down and has been afebrile in the last 2 days. -Urine strep pneumococcus antigen negative.  Urine Legionella antigen negative. -Due to Tmax of 101.6 antibiotic coverage was broadened to IV cefepime  and IV azithromycin.  Antibiotics are being continued -IV Rocephin discontinued.   -Patient pancultured.  -Continue scheduled DuoNebs, Claritin, PPI, Pulmicort -Anticoagulation changed from Eliquis to Lovenox per hematology/oncology recommendations.  -PCCM consulted due to concerns for radiation pneumonitis and patient started on a prednisone taper. -Due to worsening hypoxia CXR obtained concerning for possible volume overload and patient placed on IV Lasix and had been held due to her dizziness blood pressures but now will be continued and getting IV 40 mg twice daily up until 03/07/2022 where it has been changed to 40 mg daily by pulmonary and will be started to wean tomorrow and continuing 40 mg daily with dose reduction every 4 days thereafter -IV Solu-Medrol changed to oral prednisone 50 mg daily per PCCM on 03/02/2022. -Patient has been changed from ICS/LABA to Pulmicort nebs, continue diuresis per PCCM and no significant improvement in O2 requirements may need repeat CT chest. -PCT is <0.10 -WBC went from 12.5 -> 13.4 -> 12.5 -> 15.4 -> 15.6 -> 14.4 -Strict I's and O's and Daily Weights; Patient is -6.413 liters  -She is on Bactrim for PJP prophylaxis -Continue with DuoNeb 3 MLS 3 times daily as well as budesonide -PCCM following and appreciate further evaluation and continues to get diuresis and prednisone scheduled to 50 and will be tapered in the next coming days -CXR today showed "The heart size and mediastinal contours are stable. The heart size is mildly enlarged. Right central venous line is identified unchanged. Patchy consolidation of left lung base is noted. Minimal opacity of the medial right lung base is improved. The visualized skeletal structures are stable"   Subsegmental pulmonary embolism without acute cor pulmonale (HCC) -Small PE. Doubt this would cause cardiac issues.  -Patient initially placed on IV heparin in the ED and has been transitioned to Eliquis. -Patient on  BiPAP and due to worsening respiratory status hematology/oncology recommending changing from Eliquis to IV heparin/Lovenox. -Patient transition to full dose Lovenox and will likely be discharged on full dose Lovenox per hematology/oncology recommendations..  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  -Followed by oncology. Had 4 sessions of XRT in October 2023. Last session on 01-08-2022.  -Last received IV chemo July 2023. 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 Consider further evaluation with PET-CT or tissue sampling. Solitary 2.3 cm mass in the liver dome. This does not have a typical appearance for metastasis, and differential diagnosis also includes benign etiologies such as atypical hemangioma, adenoma, less likely focal nodular hyperplasia. Consider further evaluation with PET-CT or tissue sampling. Prior cholecystectomy. Mild dilatation of common bile duct, without evidence of choledocholithiasis or other obstructing etiology. Stable appearance of bone metastases in the thoracic spine.  Hemosiderosis." -Oncology following and recommending Lovenox  Liver Dome Mass -My notes that could be an atypical hemangioma, adenoma or less likely focal nodular hyperplasia and does not have the typical appearance for metastasis but could be benign  Uncontrolled Type 2 diabetes mellitus without complication, with long-term current use of insulin (HCC) -Stable.  -Hemoglobin A1c 11.3 (02/21/2021) -Repeat hemoglobin A1c 10.4 (02/26/2022). -CBG 70 on morning labs.  CBG noted to be in the 300s overnight..  -Elevated CBG secondary to steroids. -Continued Semglee to 34 units daily but was reduced to 25 units given hypoglycemia yesterday  but is now back at 30 but will cut back down to 28, meal coverage NovoLog 4 units 3 times daily with meals.   -Increase Moderate Novolog SSI to resistant scale -CBGs ranging from 72-326 she is very labile but Steroids are about to be started tapering    Hypothyroidism -Continue home regimen Synthroid 125 mcg daily.    Normocytic Anemia -Patient with no overt bleeding. -Anemia likely secondary to acute illness and radiation treatment. -Hemoglobin likely is now stable and improved and gone from 10.4/34.0 -> 12.3/38.9 -> 11.6/37.0 -Anemia panel done and showed an iron level 90, UIBC 149, TIBC 239, saturation ratios of 38%, ferritin level of 1198, folate of 6.2, vitamin B12 445 -Follow H&H. -Transfusion threshold hemoglobin < 7   Hypophosphatemia and is now hypophosphatemic -Replete via Pharmacy Protocol -Phosphorus at 4.5 -> 5.1 -> 4.3 -Continue to Monitor and Trend and repeat CMP in the AM    Hypomagnesemia -Magnesium at 2.4 -Repeat labs in AM.   Hypokalemia -Repleted, potassium is now 3.7 -Magnesium at 2.4 -Repeat labs in AM.   Other constipation -Continue current bowel regimen of Senokot-S twice daily, MiraLAX twice daily, Dulcolax suppository daily.    Hyponatremia, improved  - TSH 2.597.  -Free T4  1.28.   -Urine sodium 142, urine potassium 27,  -Concern hyponatremia may be secondary to small cell lung cancer.  -Sodium levels improved currently at 140 again this morning.  -Repeat labs in the AM.    Abnormal LFTs -Patient's AST went from 48 -> 42 -> 48 -> 63 -> 41 and is now 33 and ALT is 65 -> 76 -> 94 -> 88 and is now 7 -Continue to Monitor and trend and repeat CMP in the a.m. and since trending up will  obtain a right upper quadrant ultrasound as well as an acute hepatitis panel -Acute hepatitis panel was negative and right upper quadrant ultrasound done and showed "A masslike hypoechoic region is identified in the right hepatic lobe measuring up to 3.2 cm. A mass is  not excluded.  Recommend an MRI of the abdomen with and without  contrast for better evaluation of this location. The gallbladder and visualized common bile duct are unremarkable." -MRI of the abdomen with contrast will be evaluated by the medical oncology team and this is being ordered -Continue monitor and trend and repeat CMP in the a.m.   Thrombocytosis -Likely in the setting of above -Patient platelet count went from 383 -> 439 -> 349 -Repeat CBC in a.m.  DVT prophylaxis: SCDs Start: 02/26/22 0404    Code Status: DNR Family Communication: Discussed with family at bedside  Disposition Plan:  Level of care: Stepdown Status is: Inpatient Remains inpatient appropriate because: Still requires quite a bit of oxygen we will continue to wean.  MRI of the liver shows possible metastasis.  She also has a new liver dome mass.   Consultants:  Pulmonary/PCCM Medical oncology  Procedures:  As delineated as above  Antimicrobials:  Anti-infectives (From admission, onward)    Start     Dose/Rate Route Frequency Ordered Stop   03/07/22 1000  fluconazole (DIFLUCAN) tablet 100 mg  100 mg Oral Daily 03/07/22 0756     03/04/22 0900  sulfamethoxazole-trimethoprim (BACTRIM DS) 800-160 MG per tablet 1 tablet        1 tablet Oral Once per day on Mon Wed Fri 03/03/22 0952     03/01/22 2200  azithromycin (ZITHROMAX) 500 mg in sodium chloride 0.9 % 250 mL IVPB        500 mg 250 mL/hr over 60 Minutes Intravenous Every 24 hours 03/01/22 0940 03/02/22 2320   02/28/22 2200  azithromycin (ZITHROMAX) tablet 500 mg  Status:  Discontinued        500 mg Oral Daily at bedtime 02/28/22 1154 03/01/22 0940   02/27/22 1000  ceFEPIme (MAXIPIME) 2 g in sodium chloride 0.9 % 100 mL IVPB  Status:  Discontinued        2 g 200 mL/hr over 30 Minutes Intravenous Every 12 hours 02/27/22 0856 03/03/22 0951   02/26/22 2200  azithromycin (ZITHROMAX) 500 mg in sodium chloride 0.9 % 250 mL IVPB  Status:  Discontinued         500 mg 250 mL/hr over 60 Minutes Intravenous Every 24 hours 02/26/22 0900 02/28/22 1153   02/26/22 1800  cefTRIAXone (ROCEPHIN) 2 g in sodium chloride 0.9 % 100 mL IVPB  Status:  Discontinued        2 g 200 mL/hr over 30 Minutes Intravenous Every 24 hours 02/26/22 0900 02/27/22 0838   02/25/22 2015  cefTRIAXone (ROCEPHIN) 1 g in sodium chloride 0.9 % 100 mL IVPB        1 g 200 mL/hr over 30 Minutes Intravenous  Once 02/25/22 2011 02/25/22 2059   02/25/22 2015  azithromycin (ZITHROMAX) 500 mg in sodium chloride 0.9 % 250 mL IVPB        500 mg 250 mL/hr over 60 Minutes Intravenous  Once 02/25/22 2011 02/25/22 2206       Subjective: Seen and examined at bedside and was doing better.  Oxygen is being weaned.  She denies any chest pain or shortness of breath.  Feels okay.  No other concerns or complaints at this time but that later on in the afternoon she complained of a painful lump on the back of her head.  She denies any other concerns or complaints.  Objective: Vitals:   03/08/22 1200 03/08/22 1333 03/08/22 1400 03/08/22 1500  BP: 119/78  109/68 121/73  Pulse: (!) 106  (!) 101 (!) 103  Resp: 15  14 (!) 23  Temp:  98.1 F (36.7 C)    TempSrc:  Oral    SpO2: 93%  94% 96%  Weight:      Height:        Intake/Output Summary (Last 24 hours) at 03/08/2022 1657 Last data filed at 03/07/2022 1815 Gross per 24 hour  Intake --  Output 550 ml  Net -550 ml   Filed Weights   03/03/22 0500 03/06/22 0500 03/07/22 0647  Weight: 53 kg 51.9 kg 50.3 kg   Examination: Physical Exam:  Constitutional: Chronically ill-appearing elderly Caucasian female currently in no acute distress Respiratory: Diminished to auscultation bilaterally wearing supplemental oxygen via nasal cannula had some coarse breath sounds rhonchi and mild crackles. Normal respiratory effort and patient is not tachypenic.  Cardiovascular: RRR, no murmurs / rubs / gallops. S1 and S2 auscultated.  Trace edema Abdomen: Soft,  non-tender, non-distended. Bowel sounds positive.  GU: Deferred. Musculoskeletal: No clubbing / cyanosis of digits/nails. No joint deformity upper and lower extremities.  Skin: No rashes, lesions,  ulcers on limited skin evaluation. No induration; Warm and dry.  Neurologic: CN 2-12 grossly intact with no focal deficits. Romberg sign and cerebellar reflexes not assessed.  Psychiatric: Normal judgment and insight. Alert and oriented x 3. Normal mood and appropriate affect.   Data Reviewed: I have personally reviewed following labs and imaging studies  CBC: Recent Labs  Lab 03/04/22 0558 03/05/22 0440 03/06/22 0638 03/07/22 0555 03/08/22 0350  WBC 13.4* 12.5* 15.4* 15.6* 14.4*  NEUTROABS 10.1* 9.8* 12.2* 12.7* 12.1*  HGB 10.4* 11.2* 12.0 12.3 11.6*  HCT 34.0* 35.7* 38.6 38.9 37.0  MCV 95.2 94.4 94.8 93.5 93.7  PLT 440* 383 439* 420* 476   Basic Metabolic Panel: Recent Labs  Lab 03/04/22 0558 03/05/22 0440 03/06/22 0638 03/07/22 0555 03/08/22 0350  NA 142 141 138 140 140  K 4.0 3.9 4.0 3.6 3.7  CL 107 100 99 97* 98  CO2 29 32 31 32 32  GLUCOSE 63* 73 61* 80 64*  BUN 22 22 33* 38* 40*  CREATININE 0.47 0.70 0.67 0.82 0.78  CALCIUM 8.5* 8.9 9.0 9.5 9.3  MG 2.4 2.3 2.0 2.3 2.4  PHOS 2.2* 3.4 4.5 5.1* 4.3   GFR: Estimated Creatinine Clearance: 45.2 mL/min (by C-G formula based on SCr of 0.78 mg/dL). Liver Function Tests: Recent Labs  Lab 03/04/22 0558 03/05/22 0440 03/06/22 0638 03/07/22 0555 03/08/22 0350  AST 42* 48* 63* 41 33  ALT 65* 76* 94* 88* 7  ALKPHOS 64 66 68 71 69  BILITOT 0.6 0.6 0.6 0.5 0.6  PROT 7.2 7.6 7.5 8.0 7.6  ALBUMIN 2.6* 2.9* 3.1* 3.4* 3.1*   No results for input(s): "LIPASE", "AMYLASE" in the last 168 hours. No results for input(s): "AMMONIA" in the last 168 hours. Coagulation Profile: No results for input(s): "INR", "PROTIME" in the last 168 hours. Cardiac Enzymes: No results for input(s): "CKTOTAL", "CKMB", "CKMBINDEX", "TROPONINI" in  the last 168 hours. BNP (last 3 results) No results for input(s): "PROBNP" in the last 8760 hours. HbA1C: No results for input(s): "HGBA1C" in the last 72 hours. CBG: Recent Labs  Lab 03/07/22 1728 03/07/22 2104 03/08/22 0728 03/08/22 1138 03/08/22 1627  GLUCAP 390* 270* 72 210* 326*   Lipid Profile: No results for input(s): "CHOL", "HDL", "LDLCALC", "TRIG", "CHOLHDL", "LDLDIRECT" in the last 72 hours. Thyroid Function Tests: No results for input(s): "TSH", "T4TOTAL", "FREET4", "T3FREE", "THYROIDAB" in the last 72 hours. Anemia Panel: No results for input(s): "VITAMINB12", "FOLATE", "FERRITIN", "TIBC", "IRON", "RETICCTPCT" in the last 72 hours. Sepsis Labs: Recent Labs  Lab 03/02/22 0814 03/04/22 0558  PROCALCITON <0.10 <0.10    Recent Results (from the past 240 hour(s))  Urine Culture     Status: None   Collection Time: 02/27/22  8:31 AM   Specimen: Urine, Clean Catch  Result Value Ref Range Status   Specimen Description   Final    URINE, CLEAN CATCH Performed at [facility], [place] [address] [place], [place] [address]    Special Requests   Final    NONE Performed at [facility], [place] [address] [place], [place] [address]    Culture   Final    NO GROWTH Performed at [facility], [place] [address], [place], [place] [address]    Report Status 02/28/2022 FINAL  Final  MRSA Next Gen by PCR, Nasal     Status: None   Collection Time: 02/28/22  7:13 PM   Specimen: Nasal Mucosa; Nasal Swab  Result Value  Ref Range Status   MRSA by PCR Next Gen NOT DETECTED NOT DETECTED Final    Comment: (NOTE) The GeneXpert MRSA Assay (FDA approved for NASAL specimens only), is one component of a comprehensive MRSA colonization surveillance program. It is not intended to diagnose MRSA infection nor to guide or monitor treatment for MRSA infections. Test performance is not FDA approved in patients less than 56 years old. Performed at [facility], [place] [address], [place], [place] [address]      Radiology Studies: MR LIVER W WO CONTRAST  Result Date: 03/08/2022 CLINICAL DATA:  Elevated liver function tests. Possible hepatic mass on recent ultrasound. Small cell lung carcinoma. EXAM: MRI ABDOMEN WITHOUT AND WITH CONTRAST TECHNIQUE: Multiplanar multisequence MR imaging of the abdomen was performed both before and after the administration of intravenous contrast. CONTRAST:  48mL GADAVIST GADOBUTROL 1 MMOL/ML IV SOLN COMPARISON:  Ultrasound on 03/06/2022 FINDINGS: Lower chest: No acute findings. Hepatobiliary: Diffuse T2 hypointensity of the hepatic parenchyma is consistent with iron overload. A homogeneous mass is seen in the liver dome which shows marked T2 hyperintensity, and measures 2.3 x 2.2 cm on image 7/4. This shows mild arterial phase hyperenhancement which persists on subsequent phases, and shows no evidence of washout or delayed peripheral rim enhancement. This does not have typical features of liver metastasis, differential diagnosis also includes benign etiology such as atypical hemangioma and adenoma, with focal nodular hyperplasia considered less likely. No other hepatic masses are identified. Prior cholecystectomy. Mild dilatation of common bile duct is seen measuring 12 mm. No evidence of choledocholithiasis or intrahepatic biliary ductal dilatation. Pancreas: No mass or inflammatory changes. No evidence of pancreatic ductal dilatation. Spleen: Within normal limits in size. Diffuse T2 hypointensity of the splenic parenchyma is consistent with iron overload. Adrenals/Urinary Tract: A 1.3 x 1.2 cm homogeneous mass is seen involving the right adrenal gland, which measures 1.3 x 1.2 cm on image 18/4. This mass has nonspecific characteristics, but is new compared to prior PET-CT on 11/03/2021. This raises suspicion for a adrenal metastasis. A small benign-appearing right renal cyst is noted. No suspicious renal masses  identified. No evidence of hydronephrosis. Stomach/Bowel: Small hiatal hernia is seen.  Otherwise unremarkable. Vascular/Lymphatic: No pathologically enlarged lymph nodes identified. No acute vascular findings. Other:  None. Musculoskeletal: Diffuse T2 hypointensity of the bone marrow is consistent with iron overload. Several bone lesions in the thoracic spine are stable and consistent with bone metastases. 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 Consider further evaluation with PET-CT or tissue sampling. Solitary 2.3 cm mass in the liver dome. This does not have a typical appearance for metastasis, and differential diagnosis also includes benign etiologies such as atypical hemangioma, adenoma, less likely focal nodular hyperplasia. Consider further evaluation with PET-CT or tissue sampling. Prior cholecystectomy. Mild dilatation of common bile duct, without evidence of choledocholithiasis or other obstructing etiology. Stable appearance of bone metastases in the thoracic spine. Hemosiderosis. Electronically Signed   By: Marlaine Hind M.D.   On: 03/08/2022 16:04   DG CHEST PORT 1 VIEW  Result Date: 03/08/2022 CLINICAL DATA:  Shortness of breath and dyspnea. EXAM: PORTABLE CHEST 1 VIEW COMPARISON:  March 07, 2022 FINDINGS: The heart size and mediastinal contours are stable. The heart size is mildly enlarged. Right central venous line is identified unchanged. Patchy consolidation of left lung base is noted. Minimal opacity of the medial right lung base is improved. The visualized skeletal structures are stable. IMPRESSION: Left  lung base pneumonia. Medial right lung base opacity is improved compared to prior exam. Electronically Signed   By: Abelardo Diesel M.D.   On: 03/08/2022 09:06   DG CHEST PORT 1 VIEW  Result Date: 03/07/2022 CLINICAL DATA:  [id number] SOB (shortness of breath) [id number] EXAM: PORTABLE CHEST - 1 VIEW COMPARISON:  03/06/2022 FINDINGS: The  mediastinal contours are within normal limits. No cardiomegaly. Indwelling right IJ Port-A-Cath in unchanged position with the catheter tip near the cavoatrial junction. Atherosclerotic calcification of the aortic arch similar appearing left greater than right hazy interstitial opacities, most prominent in the lingula. No acute osseous abnormality. Partially visualized cervical fusion hardware, unchanged. Surgical clips in the right upper quadrant, unchanged. IMPRESSION: 1. Similar appearing bibasilar, left greater than right hazy opacities, again favoring multifocal pneumonia in the appropriate clinical setting. 2.  Aortic Atherosclerosis (ICD10-I70.0). Electronically Signed   By: Ruthann Cancer M.D.   On: 03/07/2022 08:07   US Abdomen Limited RUQ (LIVER/GB)  Result Date: 03/06/2022 CLINICAL DATA:  Abnormal LFTs. EXAM: ULTRASOUND ABDOMEN LIMITED RIGHT UPPER QUADRANT COMPARISON:  None Available. FINDINGS: Gallbladder: Surgically absent Common bile duct: Diameter: 3 mm on limited imaging Liver: A masslike hypoechoic region is identified in the right hepatic lobe measuring 2.9 x 3.2 x 2.0 cm. Portal vein is patent on color Doppler imaging with normal direction of blood flow towards the liver. Other: None. IMPRESSION: 1. A masslike hypoechoic region is identified in the right hepatic lobe measuring up to 3.2 cm. A mass is not excluded. Recommend an MRI of the abdomen with and without contrast for better evaluation of this location. 2. The gallbladder and visualized common bile duct are unremarkable. Electronically Signed   By: Dorise Bullion III M.D.   On: 03/06/2022 18:58     Scheduled Meds:  bisacodyl  10 mg Rectal Daily   budesonide (PULMICORT) nebulizer solution  0.25 mg Nebulization BID   Chlorhexidine Gluconate Cloth  6 each Topical Daily   enoxaparin (LOVENOX) injection  50 mg Subcutaneous BID   feeding supplement  237 mL Oral BID BM   fluconazole  100 mg Oral Daily   FLUoxetine  20 mg Oral Daily    fluticasone  2 spray Each Nare Daily   furosemide  40 mg Intravenous Daily   insulin aspart  0-20 Units Subcutaneous TID WC   insulin aspart  0-5 Units Subcutaneous QHS   insulin aspart  4 Units Subcutaneous TID WC   insulin glargine-yfgn  28 Units Subcutaneous QHS   ipratropium-albuterol  3 mL Nebulization TID   levothyroxine  125 mcg Oral Q0600   loratadine  10 mg Oral Daily   mouth rinse  15 mL Mouth Rinse 4 times per day   oxyCODONE  10 mg Oral Q12H   pantoprazole  40 mg Oral Daily   polyethylene glycol  17 g Oral BID   [START ON 03/09/2022] predniSONE  40 mg Oral Q breakfast   Followed by   Derrill Memo ON 03/13/2022] predniSONE  30 mg Oral Q breakfast   Followed by   Derrill Memo ON 03/17/2022] predniSONE  20 mg Oral Q breakfast   Followed by   Derrill Memo ON 03/21/2022] predniSONE  10 mg Oral Q breakfast   Followed by   Derrill Memo ON 03/25/2022] predniSONE  5 mg Oral Q breakfast   Followed by   Derrill Memo ON 03/29/2022] predniSONE  2.5 mg Oral Q breakfast   senna-docusate  2 tablet Oral BID   sulfamethoxazole-trimethoprim  1 tablet Oral Once per day  on Mon Wed Fri   Continuous Infusions:   LOS: 10 days   Raiford Noble, DO [facility] Available via Epic secure chat 7am-7pm After these hours, please refer to coverage provider listed on [email or web address] 03/08/2022, 4:57 PM

## 2022-03-08 NOTE — Progress Notes (Signed)
Patient is on 6L salter . [place] on standby

## 2022-03-08 NOTE — Progress Notes (Signed)
Overall, Ms. Firman is about the same.  She still is on quite a bit of oxygen.  Her chest x-ray does look pretty stable.  She is on prednisone.  Maybe, we can switch over to Solu-Medrol to see if this may help a little bit with this "pneumonia."  Her cultures are all negative.  She is on antibiotics.  She had ultrasound that was done a couple days ago of her abdomen.  There is a questionable liver issue.  I think this is important to identify because on a PET scan that was done back in July, she had no active disease in the abdomen.  We will have to get in MRI.  Her labs show a white count 14.4.  Hemoglobin 11.6.  Platelet count 349,000.  Sodium is 140.  Potassium 3.7.  BUN 40 creatinine 0.78.  Calcium 9.3 with an albumin of 3.1.  She says that she is eating well.  There is no nausea or vomiting.  She has had no diarrhea.  She is not coughing up any mucus.  There is no bleeding.  She has had no obvious fever.  She is out of bed sitting in the chair.  Vital signs show temperature 98.  Pulse 91.  Blood pressure 133/81.  Her lungs sound relatively clear bilaterally.  She seems to have good air movement bilaterally.  There may be some slight wheezing over on the left side.  Cardiac exam regular rate and rhythm.  Abdomen is soft.  Bowel sounds are present.  There is no fluid wave.  Extremity shows no clubbing, cyanosis or edema.  Neurological exam is nonfocal.  Again, we will have to see about getting the MRI of the liver.  I think this would be helpful as to whether or not she has truly progressive disease.  If she does, then the possibility that we could be looking at progression in her lungs.  This will be little unusual since she has had radiation therapy.  I know that she is getting wonderful care from everybody in the ICU.  I appreciate the compassion in the care that everybody gives to all of my patients.  [name], MD  [facility] [phone]

## 2022-03-09 ENCOUNTER — Encounter: Payer: Self-pay | Admitting: *Deleted

## 2022-03-09 DIAGNOSIS — E039 Hypothyroidism, unspecified: Secondary | ICD-10-CM | POA: Diagnosis not present

## 2022-03-09 DIAGNOSIS — J9601 Acute respiratory failure with hypoxia: Secondary | ICD-10-CM | POA: Diagnosis not present

## 2022-03-09 DIAGNOSIS — I2693 Single subsegmental pulmonary embolism without acute cor pulmonale: Secondary | ICD-10-CM | POA: Diagnosis not present

## 2022-03-09 DIAGNOSIS — E119 Type 2 diabetes mellitus without complications: Secondary | ICD-10-CM | POA: Diagnosis not present

## 2022-03-09 LAB — CBC WITH DIFFERENTIAL/PLATELET
Abs Immature Granulocytes: 0.12 10*3/uL — ABNORMAL HIGH (ref 0.00–0.07)
Basophils Absolute: 0 10*3/uL (ref 0.0–0.1)
Basophils Relative: 0 %
Eosinophils Absolute: 0 10*3/uL (ref 0.0–0.5)
Eosinophils Relative: 0 %
HCT: 37.1 % (ref 36.0–46.0)
Hemoglobin: 11.6 g/dL — ABNORMAL LOW (ref 12.0–15.0)
Immature Granulocytes: 1 %
Lymphocytes Relative: 3 %
Lymphs Abs: 0.5 10*3/uL — ABNORMAL LOW (ref 0.7–4.0)
MCH: 29.8 pg (ref 26.0–34.0)
MCHC: 31.3 g/dL (ref 30.0–36.0)
MCV: 95.4 fL (ref 80.0–100.0)
Monocytes Absolute: 0.5 10*3/uL (ref 0.1–1.0)
Monocytes Relative: 4 %
Neutro Abs: 13.6 10*3/uL — ABNORMAL HIGH (ref 1.7–7.7)
Neutrophils Relative %: 92 %
Platelets: 314 10*3/uL (ref 150–400)
RBC: 3.89 MIL/uL (ref 3.87–5.11)
RDW: 14 % (ref 11.5–15.5)
WBC: 14.8 10*3/uL — ABNORMAL HIGH (ref 4.0–10.5)
nRBC: 0 % (ref 0.0–0.2)

## 2022-03-09 LAB — GLUCOSE, CAPILLARY
Glucose-Capillary: 61 mg/dL — ABNORMAL LOW (ref 70–99)
Glucose-Capillary: 73 mg/dL (ref 70–99)
Glucose-Capillary: 195 mg/dL — ABNORMAL HIGH (ref 70–99)
Glucose-Capillary: 216 mg/dL — ABNORMAL HIGH (ref 70–99)
Glucose-Capillary: 282 mg/dL — ABNORMAL HIGH (ref 70–99)

## 2022-03-09 LAB — COMPREHENSIVE METABOLIC PANEL
ALT: 61 U/L — ABNORMAL HIGH (ref 0–44)
AST: 28 U/L (ref 15–41)
Albumin: 3.2 g/dL — ABNORMAL LOW (ref 3.5–5.0)
Alkaline Phosphatase: 77 U/L (ref 38–126)
Anion gap: 11 (ref 5–15)
BUN: 36 mg/dL — ABNORMAL HIGH (ref 8–23)
CO2: 31 mmol/L (ref 22–32)
Calcium: 8.9 mg/dL (ref 8.9–10.3)
Chloride: 95 mmol/L — ABNORMAL LOW (ref 98–111)
Creatinine, Ser: 0.7 mg/dL (ref 0.44–1.00)
GFR, Estimated: >60 mL/min (ref >60)
Glucose, Bld: 209 mg/dL — ABNORMAL HIGH (ref 70–99)
Potassium: 4.1 mmol/L (ref 3.5–5.1)
Sodium: 137 mmol/L (ref 135–145)
Total Bilirubin: 0.6 mg/dL (ref 0.3–1.2)
Total Protein: 8 g/dL (ref 6.5–8.1)

## 2022-03-09 LAB — PHOSPHORUS: Phosphorus: 3.5 mg/dL (ref 2.5–4.6)

## 2022-03-09 LAB — MAGNESIUM: Magnesium: 2.3 mg/dL (ref 1.7–2.4)

## 2022-03-09 NOTE — Progress Notes (Signed)
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  Patient noted prior to admission to have some chills, body aches, fever as high as 101.  Patient noted to have sats of 89% on room air and sent to the ED from urgent care center.  COVID-19 PCR negative, influenza a and B PCR negative, respiratory viral panel negative.  CT chest done with segmental PE, concern for pneumonia.  Patient placed empirically on IV antibiotics, anticoagulation.  PCCM consulted due to concerns for radiation pneumonitis.    **Interim History PCCM/pulmonary has been consulted and recommending physical therapy and checking procalcitonin.  They are recommending resuming Lasix and continue Bactrim for PJP prophylaxis and recommending a slow steroid taper and wean and treating inflammation, PE fluid as well deconditioning.  She remains on quite a bit of oxygen still but pulmonary is attempting to wean and have dropped her to 25 L with 50% and recommends that if she does okay that they can try 12 to 13 L with a regular salter and recommending that she be in the chair during the day and using incentive spirometry hourly.  She is being continued diuresis and anticoagulation as per the hematologist oncologist and they are recommending Lovenox.   Slow to improve and continues to have significant oxygen requirements.  Appreciate pulmonary evaluation and they are planning to taper to prednisone in the coming days recommending continuing current plan of care and following electrolytes and replete as needed.   She is able to be weaned to salter cannula to 13 L from heated high flow nasal cannula and continues to feel better daily.    Medical oncology evaluated and she is slowly improving.  They feel that she did not have liver lesion on her PET scan that was done in July so they can obtain an MRI of the liver.  Oxygen continues to improve and she is tolerating salter and is on now 12 L and not on heated high flow nasal cannula at this time.  Per pulmonary they will start a steroid taper tomorrow with 40 mg daily with dose reduction every 4 days thereafter.  Blood sugars are still very liable.  Her oxygen requirement is weaning further now on 6 L.  Can be transition to the progressive care unit and pulmonary following closely and recommending steroid taper.   Assessment and Plan: * Acute respiratory failure with hypoxia [facility]) -Patient admitted as patient had presented acute respiratory failure with hypoxia.   -Likely multifactorial secondary to pneumonia in a patient with immunosuppression, concern for radiation pneumonitis and also noted to have segmental PE which is unlikely source of hypoxia.   Also concern for possible volume overload as patient noted to have worsening hypoxia on 02/28/2022 required BiPAP and subsequently transferred to the stepdown unit.   -Still with significant O2 requirements.  Currently on high flow nasal cannula with a FiO2 of 80%, flow rate of 30.  Off BiPAP since 03/02/2022.  SpO2: 97 % O2 Flow Rate (L/min): 6 L/min FiO2 (%): 40 % -COVID-19 PCR negative, influenza A and B negative, respiratory viral panel negative.   -CT angiogram chest with pulmonary emboli focal in the right upper lung distal segmental and  subsegmental vessels, developing patchy airspace interstitial infiltrates most likely representing multifocal pneumonia, prominent mediastinal and abdominal lymph nodes likely metastatic, multiple bone mets.   -Patient with complaints of chills and fatigue early on in the hospitalization.   -Noted to have a Tmax of 101.6 (02/27/2022). -Fever curve trended down and has been afebrile in the last  2 days. -Urine strep pneumococcus antigen negative.  Urine Legionella antigen negative. -Due to Tmax of 101.6 antibiotic coverage was broadened to IV cefepime and IV azithromycin.  Antibiotics are being continued -IV Rocephin discontinued.   -Patient pancultured.  -Continue scheduled DuoNebs, Claritin, PPI, Pulmicort -Anticoagulation changed from Eliquis to Lovenox per hematology/oncology recommendations.  -PCCM consulted due to concerns for radiation pneumonitis and patient started on a prednisone taper. -Due to worsening hypoxia CXR obtained concerning for possible volume overload and patient placed on IV Lasix and had been held due to her dizziness blood pressures but now will be continued and getting IV 40 mg twice daily up until 03/07/2022 where it has been changed to 40 mg daily by pulmonary and will be started to wean tomorrow and continuing 40 mg daily with dose reduction every 4 days thereafter -IV Solu-Medrol changed to oral prednisone 50 mg daily per PCCM on 03/02/2022. -Patient has been changed from ICS/LABA to Pulmicort nebs, continue diuresis per PCCM and no significant improvement in O2 requirements may need repeat CT chest. -PCT is <0.10 -WBC went from 12.5 -> 13.4 -> 12.5 -> 15.4 -> 15.6 -> 14.4 -> 14.8 -Strict I's and O's and Daily Weights; Patient is -6.413 liters  -She is on Bactrim for PJP prophylaxis -Continue with DuoNeb 3 MLS 3 times daily as well as budesonide -PCCM following and appreciate further evaluation and continues to get diuresis and prednisone scheduled to 50 and will be tapered in the next coming days -CXR today showed "The heart size and mediastinal contours are stable. The heart size is mildly enlarged. Right central venous line is identified unchanged. Patchy consolidation of left lung base is noted. Minimal opacity of the medial right lung base is improved. The visualized skeletal structures are stable" -Given that she is improving can transition to the  Progressive Care Unit    Subsegmental pulmonary embolism without acute cor pulmonale (HCC) -Small PE. Doubt this would cause cardiac issues.  -Patient initially placed on IV heparin in the ED and has been transitioned to Eliquis. -Patient on BiPAP and due to worsening respiratory status hematology/oncology recommending changing from Eliquis to IV heparin/Lovenox. -Patient transition to full dose Lovenox and will likely be discharged on full dose Lovenox per hematology/oncology recommendations..  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  -Followed by oncology. Had 4 sessions of XRT in October 2023. Last session on 01-08-2022.  -Last received IV chemo July 2023. 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 Consider further evaluation with PET-CT or tissue sampling. Solitary 2.3 cm mass in the liver dome. This does not have a typical appearance for metastasis, and differential diagnosis also includes benign etiologies such as atypical hemangioma, adenoma, less likely focal nodular hyperplasia. Consider further evaluation with PET-CT or tissue sampling. Prior cholecystectomy. Mild dilatation of common bile duct, without evidence of choledocholithiasis or other obstructing etiology. Stable appearance of bone  metastases in the thoracic spine.  Hemosiderosis." -Oncology following and recommending Lovenox   Liver Dome Mass -My notes that could be an atypical hemangioma, adenoma or less likely focal nodular hyperplasia and does not have the typical appearance for metastasis but could be benign   Uncontrolled Type 2 diabetes mellitus without complication, with long-term current use of insulin ([place]) -Stable.  -Hemoglobin A1c 11.3  (02/21/2021) -Repeat hemoglobin A1c 10.4 (02/26/2022). -CBG 70 on morning labs.  CBG noted to be in the 300s overnight..  -Elevated CBG secondary to steroids. -Continued Semglee to 34 units daily but was reduced to 25 units given hypoglycemia yesterday but is now back at 30 but will cut back down to 28, meal coverage NovoLog 4 units 3 times daily with meals.   -Increase Moderate Novolog SSI to resistant scale -CBGs ranging from 72-216 she is very labile but Steroids are about to be started tapering    Hypothyroidism -Continue home regimen Synthroid 125 mcg daily.    Normocytic Anemia -Patient with no overt bleeding. -Anemia likely secondary to acute illness and radiation treatment. -Hemoglobin likely is now stable and improved and gone from 10.4/34.0 -> 12.3/38.9 -> 11.6/37.0 -> 11.6/37.1 -Anemia panel done and showed an iron level 90, UIBC 149, TIBC 239, saturation ratios of 38%, ferritin level of 1198, folate of 6.2, vitamin B12 445 -Follow H&H. -Transfusion threshold hemoglobin < 7   Hypophosphatemia and is now hypophosphatemic -Replete via Pharmacy Protocol -Phosphorus at 4.5 -> 5.1 -> 4.3 -> 3.5 -Continue to Monitor and Trend and repeat CMP in the AM    Hypomagnesemia -Magnesium at 2.3 -Repeat labs in AM.   Hypokalemia -Repleted, potassium is now 4.1 -Magnesium at 2.3 -Repeat labs in AM.   Other constipation -Continue current bowel regimen of Senokot-S twice daily, MiraLAX twice daily, Dulcolax suppository daily.    Hyponatremia, improved  - TSH 2.597.  -Free T4  1.28.   -Urine sodium 142, urine potassium 27,  -Concern hyponatremia may be secondary to small cell lung cancer.  -Sodium levels improved currently at 137 -Repeat labs in the AM.    Abnormal LFTs -Patient's AST went from 48 -> 42 -> 48 -> 63 -> 41 and is now 33 -> 28 and ALT is 65 -> 76 -> 94 -> 88 and is now 7 -> 61 -Continue to Monitor and trend and repeat CMP in the a.m. and since trending up will   obtain a right upper quadrant ultrasound as well as an acute hepatitis panel -Acute hepatitis panel was negative and right upper quadrant ultrasound done and showed "A masslike hypoechoic region is identified in the right hepatic lobe measuring up to 3.2 cm. A mass is not excluded.  Recommend an MRI of the abdomen with and without  contrast for better evaluation of this location. The gallbladder and visualized common bile duct are unremarkable." -MRI of the abdomen with contrast was ordered and showed "Hemosiderosis. New 1.3 cm left adrenal mass, suspicious for adrenal metastasis. Consider further evaluation with PET-CT or tissue sampling. Solitary 2.3 cm mass in the liver dome. This does not have a typical appearance for metastasis, and differential diagnosis also includes benign etiologies such as atypical hemangioma, adenoma, less likely focal nodular hyperplasia. Consider further evaluation with PET-CT or tissue sampling. Prior cholecystectomy. Mild dilatation of common bile duct,  without evidence of choledocholithiasis or other obstructing etiology. Stable appearance of bone metastases in the thoracic spine. Hemosiderosis." -Continue monitor and trend and repeat CMP in the a.m.   Thrombocytosis -  Likely in the setting of above -Patient platelet count went from 383 -> 439 -> 349 -> 314 -Repeat CBC in a.m.  Lesion on her head -She has a new lesion noted about 3 weeks ago -Will need outpatient dermatology to further evaluate and biopsy.   DVT prophylaxis: SCDs Start: 02/26/22 0404    Code Status: DNR Family Communication: Discussed with family/friends at bedside  Disposition Plan:  Level of care: Stepdown Status is: Inpatient Remains inpatient appropriate because: Remains on supplemental oxygen weaning.  She is slowly improving and being diuresed    Consultants:  Pulmonary/PCCM Medical oncology  Procedures:  As delineated as above  Antimicrobials:  Anti-infectives (From admission,  onward)    Start     Dose/Rate Route Frequency Ordered Stop   03/07/22 1000  fluconazole (DIFLUCAN) tablet 100 mg        100 mg Oral Daily 03/07/22 0756     03/04/22 0900  sulfamethoxazole-trimethoprim (BACTRIM DS) 800-160 MG per tablet 1 tablet        1 tablet Oral Once per day on Mon Wed Fri 03/03/22 0952     03/01/22 2200  azithromycin (ZITHROMAX) 500 mg in sodium chloride 0.9 % 250 mL IVPB        500 mg 250 mL/hr over 60 Minutes Intravenous Every 24 hours 03/01/22 0940 03/02/22 2320   02/28/22 2200  azithromycin (ZITHROMAX) tablet 500 mg  Status:  Discontinued        500 mg Oral Daily at bedtime 02/28/22 1154 03/01/22 0940   02/27/22 1000  ceFEPIme (MAXIPIME) 2 g in sodium chloride 0.9 % 100 mL IVPB  Status:  Discontinued        2 g 200 mL/hr over 30 Minutes Intravenous Every 12 hours 02/27/22 0856 03/03/22 0951   02/26/22 2200  azithromycin (ZITHROMAX) 500 mg in sodium chloride 0.9 % 250 mL IVPB  Status:  Discontinued        500 mg 250 mL/hr over 60 Minutes Intravenous Every 24 hours 02/26/22 0900 02/28/22 1153   02/26/22 1800  cefTRIAXone (ROCEPHIN) 2 g in sodium chloride 0.9 % 100 mL IVPB  Status:  Discontinued        2 g 200 mL/hr over 30 Minutes Intravenous Every 24 hours 02/26/22 0900 02/27/22 0838   02/25/22 2015  cefTRIAXone (ROCEPHIN) 1 g in sodium chloride 0.9 % 100 mL IVPB        1 g 200 mL/hr over 30 Minutes Intravenous  Once 02/25/22 2011 02/25/22 2059   02/25/22 2015  azithromycin (ZITHROMAX) 500 mg in sodium chloride 0.9 % 250 mL IVPB        500 mg 250 mL/hr over 60 Minutes Intravenous  Once 02/25/22 2011 02/25/22 2206       Subjective: Seen and examined at bedside and she is doing better.  Oxygen is continued to be weaned and she is on 6 L now.  She denies any chest pain or shortness of breath.  Feels okay.  Has a new lump on her head but states has been there for about a few weeks.  Denies any other concerns or complaints at this time.  Objective: Vitals:    03/09/22 1645 03/09/22 1700 03/09/22 1800 03/09/22 1900  BP:   109/75   Pulse: (!) 102 94 (!) 112 (!) 113  Resp:      Temp:      TempSrc:      SpO2: 96% 99% 97% 97%  Weight:      Height:  Intake/Output Summary (Last 24 hours) at 03/09/2022 1929 Last data filed at 03/09/2022 1700 Gross per 24 hour  Intake --  Output 500 ml  Net -500 ml   Filed Weights   03/03/22 0500 03/06/22 0500 03/07/22 0647  Weight: 53 kg 51.9 kg 50.3 kg   Examination: Physical Exam:  Constitutional: Chronically ill-appearing elderly Caucasian female currently in no acute distress Respiratory: Diminished to auscultation bilaterally with coarse breath sounds, no wheezing, rales, rhonchi or crackles. Normal respiratory effort and patient is not tachypenic. No accessory muscle use.  Unlabored breathing Cardiovascular: RRR, no murmurs / rubs / gallops. S1 and S2 auscultated.  Has trace extremity edema Abdomen: Soft, non-tender, non-distended. Bowel sounds positive.  GU: Deferred. Musculoskeletal: No clubbing / cyanosis of digits/nails. No joint deformity upper and lower extremities.   Skin: Has a new lump on her head that is small and slightly erythematous and painful Neurologic: CN 2-12 grossly intact with no focal deficits. Romberg sign and cerebellar reflexes not assessed.  Psychiatric: Normal judgment and insight. Alert and oriented x 3. Normal mood and appropriate affect.   Data Reviewed: I have personally reviewed following labs and imaging studies  CBC: Recent Labs  Lab 03/05/22 0440 03/06/22 0638 03/07/22 0555 03/08/22 0350 03/09/22 1252  WBC 12.5* 15.4* 15.6* 14.4* 14.8*  NEUTROABS 9.8* 12.2* 12.7* 12.1* 13.6*  HGB 11.2* 12.0 12.3 11.6* 11.6*  HCT 35.7* 38.6 38.9 37.0 37.1  MCV 94.4 94.8 93.5 93.7 95.4  PLT 383 439* 420* 349 469   Basic Metabolic Panel: Recent Labs  Lab 03/05/22 0440 03/06/22 0638 03/07/22 0555 03/08/22 0350 03/09/22 1252  NA 141 138 140 140 137  K 3.9 4.0 3.6  3.7 4.1  CL 100 99 97* 98 95*  CO2 32 31 32 32 31  GLUCOSE 73 61* 80 64* 209*  BUN 22 33* 38* 40* 36*  CREATININE 0.70 0.67 0.82 0.78 0.70  CALCIUM 8.9 9.0 9.5 9.3 8.9  MG 2.3 2.0 2.3 2.4 2.3  PHOS 3.4 4.5 5.1* 4.3 3.5   GFR: Estimated Creatinine Clearance: 45.2 mL/min (by C-G formula based on SCr of 0.7 mg/dL). Liver Function Tests: Recent Labs  Lab 03/05/22 0440 03/06/22 6295 03/07/22 0555 03/08/22 0350 03/09/22 1252  AST 48* 63* 41 33 28  ALT 76* 94* 88* 7 61*  ALKPHOS 66 68 71 69 77  BILITOT 0.6 0.6 0.5 0.6 0.6  PROT 7.6 7.5 8.0 7.6 8.0  ALBUMIN 2.9* 3.1* 3.4* 3.1* 3.2*   No results for input(s): "LIPASE", "AMYLASE" in the last 168 hours. No results for input(s): "AMMONIA" in the last 168 hours. Coagulation Profile: No results for input(s): "INR", "PROTIME" in the last 168 hours. Cardiac Enzymes: No results for input(s): "CKTOTAL", "CKMB", "CKMBINDEX", "TROPONINI" in the last 168 hours. BNP (last 3 results) No results for input(s): "PROBNP" in the last 8760 hours. HbA1C: No results for input(s): "HGBA1C" in the last 72 hours. CBG: Recent Labs  Lab 03/08/22 2154 03/09/22 0806 03/09/22 0809 03/09/22 1153 03/09/22 1547  GLUCAP 215* 61* 73 195* 216*   Lipid Profile: No results for input(s): "CHOL", "HDL", "LDLCALC", "TRIG", "CHOLHDL", "LDLDIRECT" in the last 72 hours. Thyroid Function Tests: No results for input(s): "TSH", "T4TOTAL", "FREET4", "T3FREE", "THYROIDAB" in the last 72 hours. Anemia Panel: No results for input(s): "VITAMINB12", "FOLATE", "FERRITIN", "TIBC", "IRON", "RETICCTPCT" in the last 72 hours. Sepsis Labs: Recent Labs  Lab 03/04/22 0558  PROCALCITON <0.10   Recent Results (from the past 240 hour(s))  MRSA Next Gen by  PCR, Nasal     Status: None   Collection Time: 02/28/22  7:13 PM   Specimen: Nasal Mucosa; Nasal Swab  Result Value Ref Range Status   MRSA by PCR Next Gen NOT DETECTED NOT DETECTED Final    Comment: (NOTE) The GeneXpert  MRSA Assay (FDA approved for NASAL specimens only), is one component of a comprehensive MRSA colonization surveillance program. It is not intended to diagnose MRSA infection nor to guide or monitor treatment for MRSA infections. Test performance is not FDA approved in patients less than 41 years old. Performed at [facility], [place] [address] [place], [place] [address]    Radiology Studies: MR LIVER W WO CONTRAST  Result Date: 03/08/2022 CLINICAL DATA:  Elevated liver function tests. Possible hepatic mass on recent ultrasound. Small cell lung carcinoma. EXAM: MRI ABDOMEN WITHOUT AND WITH CONTRAST TECHNIQUE: Multiplanar multisequence MR imaging of the abdomen was performed both before and after the administration of intravenous contrast. CONTRAST:  33mL GADAVIST GADOBUTROL 1 MMOL/ML IV SOLN COMPARISON:  Ultrasound on 03/06/2022 FINDINGS: Lower chest: No acute findings. Hepatobiliary: Diffuse T2 hypointensity of the hepatic parenchyma is consistent with iron overload. A homogeneous mass is seen in the liver dome which shows marked T2 hyperintensity, and measures 2.3 x 2.2 cm on image 7/4. This shows mild arterial phase hyperenhancement which persists on subsequent phases, and shows no evidence of washout or delayed peripheral rim enhancement. This does not have typical features of liver metastasis, differential diagnosis also includes benign etiology such as atypical hemangioma and adenoma, with focal nodular hyperplasia considered less likely. No other hepatic masses are identified. Prior cholecystectomy. Mild dilatation of common bile duct is seen measuring 12 mm. No evidence of choledocholithiasis or intrahepatic biliary ductal dilatation. Pancreas: No mass or inflammatory changes. No evidence of pancreatic ductal dilatation. Spleen: Within normal limits in size. Diffuse T2 hypointensity of the splenic parenchyma is consistent with iron overload. Adrenals/Urinary Tract: A 1.3 x 1.2 cm  homogeneous mass is seen involving the right adrenal gland, which measures 1.3 x 1.2 cm on image 18/4. This mass has nonspecific characteristics, but is new compared to prior PET-CT on 11/03/2021. This raises suspicion for a adrenal metastasis. A small benign-appearing right renal cyst is noted. No suspicious renal masses identified. No evidence of hydronephrosis. Stomach/Bowel: Small hiatal hernia is seen.  Otherwise unremarkable. Vascular/Lymphatic: No pathologically enlarged lymph nodes identified. No acute vascular findings. Other:  None. Musculoskeletal: Diffuse T2 hypointensity of the bone marrow is consistent with iron overload. Several bone lesions in the thoracic spine are stable and consistent with bone metastases. 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 Consider further evaluation with PET-CT or tissue sampling. Solitary 2.3 cm mass in the liver dome. This does not have a typical appearance for metastasis, and differential diagnosis also includes benign etiologies such as atypical hemangioma, adenoma, less likely focal nodular hyperplasia. Consider further evaluation with PET-CT or tissue sampling. Prior cholecystectomy. Mild dilatation of common bile duct, without evidence of choledocholithiasis or other obstructing etiology. Stable appearance of bone metastases in the thoracic spine. Hemosiderosis. Electronically Signed   By: Marlaine Hind M.D.   On: 03/08/2022 16:04   DG CHEST PORT 1 VIEW  Result Date: 03/08/2022 CLINICAL DATA:  Shortness of breath and dyspnea. EXAM: PORTABLE CHEST 1 VIEW COMPARISON:  March 07, 2022 FINDINGS: The heart size and mediastinal contours are stable. The heart size is mildly enlarged. Right central venous line is identified unchanged. Patchy consolidation  of left lung base is noted. Minimal opacity of the medial right lung base is improved. The visualized skeletal structures are stable. IMPRESSION: Left lung base  pneumonia. Medial right lung base opacity is improved compared to prior exam. Electronically Signed   By: Abelardo Diesel M.D.   On: 03/08/2022 09:06     Scheduled Meds:  bisacodyl  10 mg Rectal Daily   budesonide (PULMICORT) nebulizer solution  0.25 mg Nebulization BID   Chlorhexidine Gluconate Cloth  6 each Topical Daily   enoxaparin (LOVENOX) injection  50 mg Subcutaneous BID   feeding supplement  237 mL Oral BID BM   fluconazole  100 mg Oral Daily   FLUoxetine  20 mg Oral Daily   fluticasone  2 spray Each Nare Daily   furosemide  40 mg Intravenous Daily   insulin aspart  0-20 Units Subcutaneous TID WC   insulin aspart  0-5 Units Subcutaneous QHS   insulin aspart  4 Units Subcutaneous TID WC   insulin glargine-yfgn  28 Units Subcutaneous QHS   ipratropium-albuterol  3 mL Nebulization TID   levothyroxine  125 mcg Oral Q0600   loratadine  10 mg Oral Daily   mouth rinse  15 mL Mouth Rinse 4 times per day   oxyCODONE  10 mg Oral Q12H   pantoprazole  40 mg Oral Daily   polyethylene glycol  17 g Oral BID   predniSONE  40 mg Oral Q breakfast   Followed by   Derrill Memo ON 03/13/2022] predniSONE  30 mg Oral Q breakfast   Followed by   Derrill Memo ON 03/17/2022] predniSONE  20 mg Oral Q breakfast   Followed by   Derrill Memo ON 03/21/2022] predniSONE  10 mg Oral Q breakfast   Followed by   Derrill Memo ON 03/25/2022] predniSONE  5 mg Oral Q breakfast   Followed by   Derrill Memo ON 03/29/2022] predniSONE  2.5 mg Oral Q breakfast   senna-docusate  2 tablet Oral BID   sulfamethoxazole-trimethoprim  1 tablet Oral Once per day on Mon Wed Fri   Continuous Infusions:   LOS: 11 days   Raiford Noble, DO [facility] Available via Epic secure chat 7am-7pm After these hours, please refer to coverage provider listed on [email or web address] 03/09/2022, 7:29 PM

## 2022-03-09 NOTE — Progress Notes (Signed)
Report given to Udi, RN and patient transferred to 4W 1424. Patient notified she would be moving to another floor. Pt husband Lanaiya Lantry called and notified as well. All patients belongings transferred with patient.

## 2022-03-09 NOTE — Progress Notes (Signed)
Patient continues to be hospitalized. Recent imaging suggests progression of disease. Patient's PET was cancelled due to inpatient status. Will continue to follow for post discharge needs, office f/u and PET rescheduling.   Oncology Nurse Navigator Documentation     03/09/2022    7:45 AM  Oncology Nurse Navigator Flowsheets  Navigator Follow Up Date: 03/16/2022  Navigator Follow Up Reason: Appointment Review  Navigator Location [facility]  Navigator Encounter Type Appt/Treatment Plan Review;Telephone  Patient Visit Type MedOnc  Treatment Phase Active Tx  Barriers/Navigation Needs Coordination of Care;Education  Interventions None Required  Acuity Level 2-Minimal Needs (1-2 Barriers Identified)  Support Groups/Services Friends and Family  Time Spent with Patient 15

## 2022-03-09 NOTE — Progress Notes (Signed)
Physical Therapy Treatment Patient Details Name: [name] MRN: [id number] DOB: 02-28-1951 Today's Date: 03/09/2022   History of Present Illness 71 year old female who presented to the ED 02/25/22  with hypoxemia. PTA had 1 week URI associated with myalgias , chills, fever and urinary frequency and diarrhea.  CTA demonstrated small segmental and segmental PE in RUL and patchy infiltrates bilaterally concerning for pneumonia. PMH: Lung cancer with livr andbone mets  DM, OSA.back surgery, transfer to ICU 11/25 for worsening respiratory status.    PT Comments    Patient's friend present during session. Patient was found on 6L O2 prior to session. Denied any pain. Required supervision to sit on EOB with increased time needed. Min assist needed for transfers to recliner for safety and to handle lines. Patient was able to take a few steps to recliner with min assist before BP dropped to 82/54 (62). No complaints of fatigue, dizziness, nausea, etc. Sat patient in recliner and monitored vitals. Patient plans on returning home with family support.  Supine        HR:111    SpO2: 97% on 6L          RR:20        BP: 109/74 (85)  EOB            HR:130    SpO2: 95% on 6L          RR:16        BP: 110/80 (86) Standing      HR:130    SpO2: 90% on 6L          RR:17        BP: 82/54 (62) Recliner       HR:127    SpO2: 95% on 6L          RR:17        BP: 102/71 (79)    Recommendations for follow up therapy are one component of a multi-disciplinary discharge planning process, led by the attending physician.  Recommendations may be updated based on patient status, additional functional criteria and insurance authorization.  Follow Up Recommendations  Home health PT     Assistance Recommended at Discharge Frequent or constant Supervision/Assistance  Patient can return home with the following A little help with walking and/or transfers;A little help with bathing/dressing/bathroom;Help with stairs or ramp for  entrance;Assistance with cooking/housework;Assist for transportation   Equipment Recommendations  Rolling walker (2 wheels)    Recommendations for Other Services       Precautions / Restrictions Precautions Precautions: Fall Precaution Comments: on HHHFNC, monitor HR Restrictions Weight Bearing Restrictions: No     Mobility  Bed Mobility Overal bed mobility: Needs Assistance Bed Mobility: Supine to Sit     Supine to sit: Supervision     General bed mobility comments: increased time needed    Transfers Overall transfer level: Needs assistance Equipment used: Rolling walker (2 wheels) Transfers: Sit to/from Stand, Bed to chair/wheelchair/BSC Sit to Stand: Min assist   Step pivot transfers: Min assist       General transfer comment: Min assist needed for safety and to handle lines.    Ambulation/Gait               General Gait Details: deferred   Stairs             Wheelchair Mobility    Modified Rankin (Stroke Patients Only)       Balance  Cognition Arousal/Alertness: Awake/alert Behavior During Therapy: WFL for tasks assessed/performed Overall Cognitive Status: Within Functional Limits for tasks assessed                                          Exercises      General Comments        Pertinent Vitals/Pain Pain Assessment Pain Assessment: No/denies pain Pain Intervention(s): Monitored during session    Home Living                          Prior Function            PT Goals (current goals can now be found in the care plan section) Acute Rehab PT Goals Patient Stated Goal: go home PT Goal Formulation: With patient Time For Goal Achievement: 03/18/22 Potential to Achieve Goals: Good Progress towards PT goals: Progressing toward goals    Frequency    Min 3X/week      PT Plan Current plan remains appropriate     Co-evaluation              AM-PAC PT "6 Clicks" Mobility   Outcome Measure  Help needed turning from your back to your side while in a flat bed without using bedrails?: A Little Help needed moving from lying on your back to sitting on the side of a flat bed without using bedrails?: A Little Help needed moving to and from a bed to a chair (including a wheelchair)?: A Little Help needed standing up from a chair using your arms (e.g., wheelchair or bedside chair)?: A Little Help needed to walk in hospital room?: A Little Help needed climbing 3-5 steps with a railing? : A Lot 6 Click Score: 17    End of Session Equipment Utilized During Treatment: Oxygen;Gait belt Activity Tolerance: Patient tolerated treatment well;No increased pain Patient left: in chair;with call bell/phone within reach;with chair alarm set;with family/visitor present;with SCD's reapplied Nurse Communication: Mobility status PT Visit Diagnosis: Unsteadiness on feet (R26.81);Difficulty in walking, not elsewhere classified (R26.2)     Time: 5465-0354 PT Time Calculation (min) (ACUTE ONLY): 15 min  Charges:  $Therapeutic Activity: 23-37 mins                        Elowyn Raupp 03/09/2022, 1:12 PM

## 2022-03-09 NOTE — Progress Notes (Signed)
NAME:  [name], MRN:  [id number], DOB:  April 11, 1950, LOS: 11 ADMISSION DATE:  02/25/2022, CONSULTATION DATE:  02/26/22 REFERRING MD:  Irine Seal, MD CHIEF COMPLAINT:  Acute hypoxemic respiratory failure   History of Present Illness:  71 year old female former smoker (50 pack-years, quit 2018) who presented to the ED and found with hypoxemia. PTA had 1 week URI associated with myalgias , chills, fever and urinary frequency and diarrhea. COVID neg at home. She continued to feel poorly and presented to urgent care with SpO2 89% so was advised to present to the ED. CTA demonstrated small segmental and segmental PE in RUL and patchy infiltrates bilaterally concerning for pneumonia. RVP and COVID/influenza neg. Started on supplemental oxygen, ceftriaxone and azithromycin and heparin. PCCM consulted.  She reports she is active at baseline with no limitations in activity. Followed by Dr. Lamonte Sakai for her diagnosis of lung cancer but did not need any bronchodilators. Last radiation treatment was in October 2023. No immediate issues post-procedure. URI symptoms one week ago as noted above. No wheezing or productive cough.  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 Hiatal hernia. Unchanged lymphadenopathy. Emphysema. Patchy GGO bilaterally. Prior bone mets seen. 11/25 Transferred to SDU for BiPAP for pulmonary edema  Interim History / Subjective:  No acute issues overnight  Objective   Blood pressure 115/83, pulse 89, temperature 98 F (36.7 C), temperature source Oral, resp. rate 13, height 4' 11.5" (1.511 m),  weight 50.3 kg, SpO2 92 %.        Intake/Output Summary (Last 24 hours) at 03/09/2022 4356 Last data filed at 03/08/2022 1800 Gross per 24 hour  Intake --  Output 700 ml  Net -700 ml   Filed Weights   03/03/22 0500 03/06/22 0500 03/07/22 0647  Weight: 53 kg 51.9 kg 50.3 kg   Physical Exam: General appearance: 71 y.o., female, NAD, conversant  Eyes: PERRL, tracking appropriately Lungs: diminished bl, with normal respiratory effort CV: tachy RR, no murmur  Abdomen: Soft, non-tender; non-distended, BS present  Extremities: No peripheral edema, warm Skin: Normal turgor and texture; no rash Neuro: grossly nonfocal  Labs/imaging reviewed CMP, CBC to be collected  MR Liver 12/3: 1.3cm left adrenal mass suspicious for metastasis. 2.3cm liver mass unclear etiology  Resolved Hospital Problem list   N/A  Assessment & Plan:   Acute hypoxemic respiratory failure multifactorial 2/2 ?radiation pneumonitis, ?aspiration, pulmonary embolus, now acute pulmonary edema, some improvement with diuresis +steroids.  Some of this is lack of mobility and derecruitment. Baseline abnormal lung architecture- prior Cts show bronchiectasis in lower lobes. 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, questionable new liver met DM2- poorly controlled with hyperglycemia, exacerbated by steroids  - Continue lasix as tolerated by renal function, changed to 12m daily, awaiting labs today - Follow electrolytes and replete as needed - Steroid taper 432mdaily with dose reduction by 10 mg every 4 days thereafter - Bactrim for PJP ppx until reaches dose of 10 mg daily - Wean HHFNC for sats > 92% - PT/mobilize - [facility] - Will follow, will be slow wean: treat inflammation, PE, fluid, deconditioning  Best Practice (right click and "Reselect all SmartList Selections" daily)    Per primary  Critical care time: [place] for personal pager PCCM on call pager [phone] until 7pm. Please call Elink 7p-7a. [phone]

## 2022-03-10 ENCOUNTER — Inpatient Hospital Stay (HOSPITAL_COMMUNITY): Payer: PPO

## 2022-03-10 ENCOUNTER — Inpatient Hospital Stay: Payer: PPO | Admitting: Adult Health

## 2022-03-10 DIAGNOSIS — I2693 Single subsegmental pulmonary embolism without acute cor pulmonale: Secondary | ICD-10-CM | POA: Diagnosis not present

## 2022-03-10 DIAGNOSIS — E119 Type 2 diabetes mellitus without complications: Secondary | ICD-10-CM | POA: Diagnosis not present

## 2022-03-10 DIAGNOSIS — J9601 Acute respiratory failure with hypoxia: Secondary | ICD-10-CM | POA: Diagnosis not present

## 2022-03-10 DIAGNOSIS — E039 Hypothyroidism, unspecified: Secondary | ICD-10-CM | POA: Diagnosis not present

## 2022-03-10 LAB — CBC WITH DIFFERENTIAL/PLATELET
Abs Immature Granulocytes: 0.11 10*3/uL — ABNORMAL HIGH (ref 0.00–0.07)
Basophils Absolute: 0 10*3/uL (ref 0.0–0.1)
Basophils Relative: 0 %
Eosinophils Absolute: 0.1 10*3/uL (ref 0.0–0.5)
Eosinophils Relative: 0 %
HCT: 36.1 % (ref 36.0–46.0)
Hemoglobin: 11.1 g/dL — ABNORMAL LOW (ref 12.0–15.0)
Immature Granulocytes: 1 %
Lymphocytes Relative: 9 %
Lymphs Abs: 1.2 10*3/uL (ref 0.7–4.0)
MCH: 29.7 pg (ref 26.0–34.0)
MCHC: 30.7 g/dL (ref 30.0–36.0)
MCV: 96.5 fL (ref 80.0–100.0)
Monocytes Absolute: 1.1 10*3/uL — ABNORMAL HIGH (ref 0.1–1.0)
Monocytes Relative: 8 %
Neutro Abs: 10.9 10*3/uL — ABNORMAL HIGH (ref 1.7–7.7)
Neutrophils Relative %: 82 %
Platelets: 277 10*3/uL (ref 150–400)
RBC: 3.74 MIL/uL — ABNORMAL LOW (ref 3.87–5.11)
RDW: 13.8 % (ref 11.5–15.5)
WBC: 13.4 10*3/uL — ABNORMAL HIGH (ref 4.0–10.5)
nRBC: 0 % (ref 0.0–0.2)

## 2022-03-10 LAB — MAGNESIUM: Magnesium: 2.2 mg/dL (ref 1.7–2.4)

## 2022-03-10 LAB — COMPREHENSIVE METABOLIC PANEL
ALT: 52 U/L — ABNORMAL HIGH (ref 0–44)
AST: 27 U/L (ref 15–41)
Albumin: 3 g/dL — ABNORMAL LOW (ref 3.5–5.0)
Alkaline Phosphatase: 68 U/L (ref 38–126)
Anion gap: 8 (ref 5–15)
BUN: 27 mg/dL — ABNORMAL HIGH (ref 8–23)
CO2: 28 mmol/L (ref 22–32)
Calcium: 8.7 mg/dL — ABNORMAL LOW (ref 8.9–10.3)
Chloride: 97 mmol/L — ABNORMAL LOW (ref 98–111)
Creatinine, Ser: 0.67 mg/dL (ref 0.44–1.00)
GFR, Estimated: >60 mL/min (ref >60)
Glucose, Bld: 218 mg/dL — ABNORMAL HIGH (ref 70–99)
Potassium: 3.2 mmol/L — ABNORMAL LOW (ref 3.5–5.1)
Sodium: 133 mmol/L — ABNORMAL LOW (ref 135–145)
Total Bilirubin: 0.5 mg/dL (ref 0.3–1.2)
Total Protein: 7.4 g/dL (ref 6.5–8.1)

## 2022-03-10 LAB — GLUCOSE, CAPILLARY
Glucose-Capillary: 134 mg/dL — ABNORMAL HIGH (ref 70–99)
Glucose-Capillary: 313 mg/dL — ABNORMAL HIGH (ref 70–99)
Glucose-Capillary: 218 mg/dL — ABNORMAL HIGH (ref 70–99)
Glucose-Capillary: 106 mg/dL — ABNORMAL HIGH (ref 70–99)

## 2022-03-10 LAB — PHOSPHORUS: Phosphorus: 3.3 mg/dL (ref 2.5–4.6)

## 2022-03-10 MED ORDER — BUDESONIDE 0.5 MG/2ML IN SUSP
0.5000 mg | Freq: Two times a day (BID) | RESPIRATORY_TRACT | Status: DC
  Administered 2022-03-10 – 2022-03-12 (×4): 0.5 mg via RESPIRATORY_TRACT
  Filled 2022-03-10 (×4): qty 2

## 2022-03-10 MED ORDER — POTASSIUM CHLORIDE CRYS ER 20 MEQ PO TBCR
40.0000 meq | EXTENDED_RELEASE_TABLET | Freq: Every day | ORAL | Status: DC
  Administered 2022-03-11 – 2022-03-12 (×2): 40 meq via ORAL
  Filled 2022-03-10 (×2): qty 2

## 2022-03-10 MED ORDER — POTASSIUM CHLORIDE CRYS ER 20 MEQ PO TBCR
40.0000 meq | EXTENDED_RELEASE_TABLET | Freq: Once | ORAL | Status: AC
  Administered 2022-03-10: 40 meq via ORAL
  Filled 2022-03-10: qty 2

## 2022-03-10 NOTE — Progress Notes (Signed)
[name] is now out of the ICU.  She is on 4 E.  She still is done with this pneumonia.  She had a chest x-ray this morning..  She did have a MRI of the abdomen that was done a couple days ago.  This did not show anything that was suspicious in the liver for metastasis.  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  I am writing to worried about this part.  I really do not think she needs a PET scan for this.  I think she is set up for a PET scan as an outpatient which we can always keep.  I just hate that she is still having some of the respiratory issues.  She is on oxygen.  Her appetite is still good.  No show steroids have some to do with this.  She has had no fever.  She has had no hemoptysis.  There is been no diarrhea.  She is out of bed a little bit.  I am unsure which physical therapy she is able to do.  Yesterday, she had lab work done.  Her white count was 14.8.  Hemoglobin 9.6.  Platelet count 314,000.  Her blood sugars on the high side at 209.  BUN is 36 creatinine 0.7.  Calcium 8.9 with an albumin of 3.2.  On physical exam, her vital signs are temperature 97.6.  Pulse 102.  Blood pressure 119/75.  Her lungs still sound pretty clear bilaterally.  There may be little bit of wheezing.  Cardiac exam tachycardic but regular.  Abdomen is soft.  Bowel sounds are present.  There is no guarding or rebound tenderness.  Extremity shows no clubbing, cyanosis or edema.  Neurological exam is nonfocal.  [name] has the extensive stage small cell lung cancer.  She has not had any obvious therapy for a couple months.  We were to start her on immunotherapy.  However, she developed this pneumonia.  Hopefully, this will improve.  This may be from radiation pneumonitis.  I think she is on a steroid taper.  Looks like there may be a new adrenal metastasis.  Again I am not really too worried about this.  I think her quality of life will be dictated by what is  happening with her lungs.  I know that she will get incredible care from everybody up on 4 W.  I do appreciate their compassion.  Kelly Haw, MD  [place] 5:2

## 2022-03-10 NOTE — Progress Notes (Signed)
NAME:  BOWIE DOIRON, MRN:  [id number], DOB:  [birth year], LOS: 12 ADMISSION DATE:  02/25/2022, CONSULTATION DATE:  02/26/22 REFERRING MD:  Irine Seal, MD CHIEF COMPLAINT:  Acute hypoxemic respiratory failure   History of Present Illness:  71 year old female former smoker (50 pack-years, quit 2018) who presented to the ED and found with hypoxemia. PTA had 1 week URI associated with myalgias, chills, fever and urinary frequency and diarrhea. COVID neg at home. She continued to feel poorly and presented to urgent care with SpO2 89% so was advised to present to the ED. CTA demonstrated small segmental and segmental PE in RUL and patchy infiltrates bilaterally concerning for pneumonia. RVP and COVID/influenza neg. Started on supplemental oxygen, ceftriaxone and azithromycin and heparin. PCCM consulted.  She reports she is active at baseline with no limitations in activity. Followed by Dr. Lamonte Sakai for her diagnosis of lung cancer but did not need any bronchodilators. Last radiation treatment was in October 2023. No immediate issues post-procedure. URI symptoms one week ago as noted above. No wheezing or productive cough.  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 Hiatal hernia. Unchanged lymphadenopathy. Emphysema. Patchy GGO bilaterally. Prior bone mets seen. 11/25 Transferred to SDU for BiPAP for pulmonary edema 12/3 MR Liver >> 1.3cm left adrenal mass suspicious for metastasis. 2.3cm liver mass unclear etiology  Interim History / Subjective:  Afebrile / Tmax 99  Pt reports feeling better overall  O2 weaned to  3L   Objective   Blood pressure 109/78, pulse 99, temperature 99 F (37.2 C), temperature source Oral, resp. rate 16, height 4' 11.5" (1.511 m), weight 51.4 kg, SpO2 98 %.        Intake/Output Summary (Last 24 hours) at 03/10/2022 1635 Last data filed at 03/10/2022 1100 Gross per 24 hour  Intake 360 ml  Output 1150 ml  Net -790 ml   Filed Weights   03/06/22 0500 03/07/22 0647 03/10/22 0500  Weight: 51.9 kg 50.3 kg 51.4 kg   Physical Exam: General: elderly adult female sitting up in bed in NAD  HEENT: MM pink/moist, [place] O2 Neuro: AAOx4, speech clear CV: s1s2 RRR, no m/r/g PULM: non-labored at rest, lungs bilaterally clear  GI: soft, bsx4 active  Extremities: warm/dry, no edema  Skin: no rashes or lesions  Resolved Hospital Problem list     Assessment & Plan:   Acute Hypoxemic Respiratory Failure multifactorial 2/2 ?radiation pneumonitis, ?aspiration, pulmonary embolus, now acute pulmonary edema, some improvement with diuresis +steroids.  Some of this is lack of mobility and derecruitment. Baseline abnormal lung architecture - prior Cts show bronchiectasis in lower lobes. 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, questionable new liver met DM2- poorly controlled with hyperglycemia, exacerbated by steroids  -lasix as BP / renal function tolerate  -follow electrolytes, replace as indicated  -complete steroid taper > 66m with dose reduction of 146mQ4 days -Bactrim for PJP prophylaxis until she reaches a dose of 10 mg QD  -wean O2 for sats >90% -will need ambulatory O2 needs assessment in am  -PT/OT / mobilize  -continue  home anticoagulation  -insulin per primary  -PCCM will follow up in am   Best Practice (right click and "Reselect all SmartList Selections" daily)  Per primary  Critical care time: n/a    Noe Gens, MSN, APRN, NP-C, AGACNP-BC [facility] 03/10/2022, 5:03 PM   Please see [email or web address] for pager details.   From 7A-7P if no response, please call [phone] After hours, please call ELink [phone]

## 2022-03-10 NOTE — Progress Notes (Signed)
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  Patient noted prior to admission to have some chills, body aches, fever as high as 101.  Patient noted to have sats of 89% on room air and sent to the ED from urgent care center.  COVID-19 PCR negative, influenza a and B PCR negative, respiratory viral panel negative.  CT chest done with segmental PE, concern for pneumonia.  Patient placed empirically on IV antibiotics, anticoagulation.  PCCM consulted due to concerns for radiation pneumonitis.    **Interim History PCCM/pulmonary has been consulted and recommending physical therapy and checking procalcitonin.  They are recommending resuming Lasix and continue Bactrim for PJP prophylaxis and recommending a slow steroid taper and wean and treating inflammation, PE fluid as well deconditioning.  She remains on quite a bit of oxygen still but pulmonary is attempting to wean and have dropped her to 25 L with 50% and recommends that if she does okay that they can try 12 to 13 L with a regular salter and recommending that she be in the chair during the day and using incentive spirometry hourly.  She is being continued diuresis and anticoagulation as per the hematologist oncologist and they are recommending Lovenox.   Slow to improve and continues to have significant oxygen requirements.  Appreciate pulmonary evaluation and they are planning to taper to prednisone in the coming days recommending continuing current plan of care and following electrolytes and replete as needed.   She is able to be weaned to salter cannula to 13 L from heated high flow nasal cannula and continues to feel better daily.    Medical oncology evaluated and she is slowly improving.  They feel that she did not have liver lesion on her PET scan that was done in July so they can obtain an MRI of the liver.  Oxygen continues to improve and she is tolerating salter and is on now 12 L and not on heated high flow nasal cannula at this time.  Per pulmonary they will start a steroid taper tomorrow with 40 mg daily with dose reduction every 4 days thereafter.  Blood sugars are still very liable.   Her oxygen requirement is weaning further now on 6 L.  Can be transition to the progressive care unit and pulmonary following closely and recommending steroid taper, Lasix, progressive mobility as well as oxygen weaning and recommending that she adhere to her incentive spirometer.  They feel that she may be able to be weaned off of oxygen completely tomorrow and she will need an ambulatory home O2 screen prior to discharging.  Assessment and Plan: * Acute respiratory failure with hypoxia [facility]) -Patient admitted as patient had presented acute respiratory failure with hypoxia.   -Likely multifactorial secondary to pneumonia in a patient with immunosuppression, concern for radiation pneumonitis and also noted to have segmental PE which is unlikely source of hypoxia.   Also concern for possible volume overload as patient noted to have worsening hypoxia on 02/28/2022 required BiPAP and subsequently transferred to the stepdown unit.   -Still with significant O2 requirements.  Currently on high flow nasal cannula with a FiO2 of 80%, flow rate of 30.  Off BiPAP  since 03/02/2022.  -SpO2: 98 % O2 Flow Rate (L/min): 3 L/min (weaning O2 w/cardiology) FiO2 (%): 40 % -COVID-19 PCR negative, influenza A and B negative, respiratory viral panel negative.   -CT angiogram chest with pulmonary emboli focal in the right upper lung distal segmental and subsegmental vessels, developing patchy airspace interstitial infiltrates most likely representing multifocal  pneumonia, prominent mediastinal and abdominal lymph nodes likely metastatic, multiple bone mets.   -Patient with complaints of chills and fatigue early on in the hospitalization.   -Noted to have a Tmax of 101.6 (02/27/2022). -Fever curve trended down and has been afebrile in the last 2 days. -Urine strep pneumococcus antigen negative.  Urine Legionella antigen negative. -Due to Tmax of 101.6 antibiotic coverage was broadened to IV cefepime and IV azithromycin.  Antibiotics are being continued -IV Rocephin discontinued.   -Patient pancultured.  -Continue scheduled DuoNebs, Claritin, PPI, Pulmicort -Anticoagulation changed from Eliquis to Lovenox per hematology/oncology recommendations.  -PCCM consulted due to concerns for radiation pneumonitis and patient started on a prednisone taper. -Due to worsening hypoxia CXR obtained concerning for possible volume overload and patient placed on IV Lasix and had been held due to her dizziness blood pressures but now will be continued and getting IV 40 mg twice daily up until 03/07/2022 where it has been changed to 40 mg daily by pulmonary  -Prednisone continuing at 40 mg daily with dose reduction every 4 days thereafter -Patient has been changed from ICS/LABA to Pulmicort nebs, continue diuresis per PCCM and no significant improvement in O2 requirements may need repeat CT chest. -PCT is <0.10 -WBC went from 12.5 -> 13.4 -> 12.5 -> 15.4 -> 15.6 -> 14.4 -> 14.8 -> 13.4 -Strict I's and O's and Daily Weights; Patient is -6.413 liters  -She is on Bactrim for PJP prophylaxis -Continue with DuoNeb 3 MLS 3 times daily as well as budesonide -PCCM following and appreciate further evaluation and continues to get diuresis and prednisone scheduled to 50 and will be tapered in the next coming days -CXR today showed "Findings suspicious for lingular pneumonia, potentially with early involvement of the RIGHT lung base. RIGHT IJ Port-A-Cath terminates at the upper RIGHT  atrium." -Given that she is improving can transition to the Progressive Care Unit    Subsegmental pulmonary embolism without acute cor pulmonale (HCC) -Small PE. Doubt this would cause cardiac issues.  -Patient initially placed on IV heparin in the ED and has been transitioned to Eliquis. -Patient on BiPAP and due to worsening respiratory status hematology/oncology recommending changing from Eliquis to IV heparin/Lovenox. -Patient transition to full dose Lovenox and will likely be discharged on full dose Lovenox per hematology/oncology recommendations..  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  -Followed by oncology. Had 4 sessions of XRT in October 2023. Last session on 01-08-2022.  -Last received IV chemo July 2023. 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 Consider further evaluation with PET-CT or tissue sampling. Solitary 2.3 cm mass in the liver dome. This does not have a typical appearance for metastasis, and differential diagnosis also includes benign etiologies such as atypical hemangioma, adenoma, less likely focal nodular hyperplasia. Consider further evaluation with PET-CT or tissue sampling. Prior cholecystectomy. Mild dilatation of common bile duct, without evidence  of choledocholithiasis or other obstructing etiology. Stable appearance of bone metastases in the thoracic spine.  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  -Hemoglobin A1c 11.3 (02/21/2021) -Repeat hemoglobin A1c 10.4 (02/26/2022). -CBG 70 on morning labs.  CBG noted to be in the 300s overnight..  -Elevated CBG secondary to steroids. -Continued Semglee to 34 units daily but was reduced to 25 units given hypoglycemia yesterday but is now back at 30 but will cut back down to 28, meal coverage NovoLog 4 units 3 times daily with meals.   -Increase Moderate Novolog SSI to resistant scale -CBGs ranging from 134-313 she is very labile but Steroids are about to be started tapering    Hypothyroidism -Continue home regimen Synthroid 125 mcg daily.    Normocytic Anemia -Patient with no overt bleeding. -Anemia likely secondary to acute illness and radiation treatment. -Hemoglobin likely is now stable and improved and gone from 10.4/34.0 -> 12.3/38.9 -> 11.6/37.0 -> 11.6/37.1 -> 11.1/36.1 -Anemia panel done and showed an iron level 90, UIBC 149, TIBC 239, saturation ratios of 38%, ferritin level of 1198, folate of 6.2, vitamin B12 445 -Follow H&H. -Transfusion threshold hemoglobin < 7   Hypophosphatemia and is now hypophosphatemic -Replete via Pharmacy Protocol -Phosphorus at 4.5 -> 5.1 -> 4.3 -> 3.5 -> 3.3 -Continue to Monitor and Trend and repeat CMP in the AM    Hypomagnesemia -Magnesium at 2.2 -Repeat labs in AM.   Hypokalemia -Repleted, potassium is now 3.2 -Replete with po Kcl 40 mEQ BID x2 -Magnesium at 2.2 -Repeat labs in AM.   Other constipation -Continue current bowel regimen of Senokot-S twice daily, MiraLAX twice daily, Dulcolax suppository daily.     Hyponatremia, improved  - TSH 2.597.  -Free T4  1.28.   -Urine sodium 142, urine potassium 27,  -Concern hyponatremia may be secondary to small cell lung cancer.  -Sodium levels now dropped and went from 137 -> 133 -Repeat labs in the AM.    Abnormal LFTs -Patient's AST went from 48 -> 42 -> 48 -> 63 -> 41 and is now 33 -> 28 -> 27 and ALT is 65 -> 76 -> 94 -> 88 ->  7 -> 61 -> 52 -Continue to Monitor and trend and repeat CMP in the a.m. and since trending up will  obtain a right upper quadrant ultrasound as well as an acute hepatitis panel -Acute hepatitis panel was negative and right upper quadrant ultrasound done and showed "A masslike hypoechoic region is identified in the right hepatic lobe measuring up to 3.2 cm. A mass is not excluded.  Recommend an MRI of the abdomen with and without  contrast for better evaluation of this location. The gallbladder and visualized common bile duct are unremarkable." -MRI of the abdomen with contrast was ordered and showed "Hemosiderosis. New 1.3 cm left adrenal mass, suspicious for adrenal  metastasis. Consider further evaluation with PET-CT or tissue sampling. Solitary 2.3 cm mass in the liver dome. This does not have a typical appearance for metastasis, and differential diagnosis also includes benign etiologies such as atypical hemangioma, adenoma, less likely focal nodular hyperplasia. Consider further evaluation with PET-CT or tissue sampling. Prior cholecystectomy. Mild dilatation of common bile duct,  without evidence of choledocholithiasis or other obstructing etiology. Stable appearance of bone metastases in the thoracic spine. Hemosiderosis." -Continue monitor and trend and repeat CMP in the a.m.   Thrombocytosis -Likely in the setting of above -Patient platelet count went from 383 -> 439 -> 349 -> 314 -> 277 -Repeat CBC in a.m.   Lesion on her head -She has a new lesion noted about 3 weeks ago -Will need outpatient dermatology to further  evaluate and biopsy.  DVT prophylaxis: SCDs Start: 02/26/22 0404    Code Status: DNR Family Communication: No family currently at bedside  Disposition Plan:  Level of care: Progressive Status is: Inpatient Remains inpatient appropriate because: Remains on supplemental oxygen is weaning slowly.  Pulmonary recommending continue current is down to 3 L now.  Pulmonary feels that she may be able to be weaned off of oxygen   Consultants:  PCCM/pulmonary Medical oncology  Procedures:  As delineated as above  Antimicrobials:  Anti-infectives (From admission, onward)    Start     Dose/Rate Route Frequency Ordered Stop   03/07/22 1000  fluconazole (DIFLUCAN) tablet 100 mg        100 mg Oral Daily 03/07/22 0756     03/04/22 0900  sulfamethoxazole-trimethoprim (BACTRIM DS) 800-160 MG per tablet 1 tablet        1 tablet Oral Once per day on Mon Wed Fri 03/03/22 0952     03/01/22 2200  azithromycin (ZITHROMAX) 500 mg in sodium chloride 0.9 % 250 mL IVPB        500 mg 250 mL/hr over 60 Minutes Intravenous Every 24 hours 03/01/22 0940 03/02/22 2320   02/28/22 2200  azithromycin (ZITHROMAX) tablet 500 mg  Status:  Discontinued        500 mg Oral Daily at bedtime 02/28/22 1154 03/01/22 0940   02/27/22 1000  ceFEPIme (MAXIPIME) 2 g in sodium chloride 0.9 % 100 mL IVPB  Status:  Discontinued        2 g 200 mL/hr over 30 Minutes Intravenous Every 12 hours 02/27/22 0856 03/03/22 0951   02/26/22 2200  azithromycin (ZITHROMAX) 500 mg in sodium chloride 0.9 % 250 mL IVPB  Status:  Discontinued        500 mg 250 mL/hr over 60 Minutes Intravenous Every 24 hours 02/26/22 0900 02/28/22 1153   02/26/22 1800  cefTRIAXone (ROCEPHIN) 2 g in sodium chloride 0.9 % 100 mL IVPB  Status:  Discontinued        2 g 200 mL/hr over 30 Minutes Intravenous Every 24 hours 02/26/22 0900 02/27/22 0838   02/25/22 2015  cefTRIAXone (ROCEPHIN) 1 g in sodium chloride 0.9 % 100 mL IVPB        1 g 200 mL/hr over 30 Minutes  Intravenous  Once 02/25/22 2011 02/25/22 2059   02/25/22 2015  azithromycin (ZITHROMAX) 500 mg in sodium chloride 0.9 % 250 mL IVPB        500 mg 250 mL/hr over 60 Minutes Intravenous  Once 02/25/22 2011 02/25/22 2206       Subjective: And examined at bedside was feeling better and was about is being weaned.  Denied  any chest pain or shortness breath.  Denies any lightheadedness or dizziness.  No other concerns or complaints at this time.  Objective: Vitals:   03/10/22 1130 03/10/22 1309 03/10/22 1347 03/10/22 1406  BP:  109/78    Pulse:  99    Resp:  16    Temp:  99 F (37.2 C)    TempSrc:  Oral    SpO2: 95% 95% 98% 98%  Weight:      Height:        Intake/Output Summary (Last 24 hours) at 03/10/2022 1716 Last data filed at 03/10/2022 1100 Gross per 24 hour  Intake 360 ml  Output 950 ml  Net -590 ml   Filed Weights   03/06/22 0500 03/07/22 0647 03/10/22 0500  Weight: 51.9 kg 50.3 kg 51.4 kg   Examination: Physical Exam:  Constitutional: WN/WD chronically ill-appearing Caucasian female currently no distress and is improving Respiratory: Diminished to auscultation bilaterally with coarse breath sounds and some rhonchi and slight crackles noted.  Has a normal respiratory effort and she is not tachypneic but she is wearing supplemental oxygen via nasal cannula Cardiovascular: RRR, no murmurs / rubs / gallops. S1 and S2 auscultated.  Has mild extremity edema Abdomen: Soft, non-tender, non-distended. Bowel sounds positive.  GU: Deferred. Musculoskeletal: No clubbing / cyanosis of digits/nails. No joint deformity upper and lower extremities.  Skin: No rashes, lesions, ulcers limited skin evaluation. No induration; Warm and dry.  Neurologic: CN 2-12 grossly intact with no focal deficits.  Romberg sign and cerebellar reflexes not assessed.  Psychiatric: Normal judgment and insight. Alert and oriented x 3. Normal mood and appropriate affect.   Data Reviewed: I have personally  reviewed following labs and imaging studies  CBC: Recent Labs  Lab 03/06/22 0638 03/07/22 0555 03/08/22 0350 03/09/22 1252 03/10/22 0845  WBC 15.4* 15.6* 14.4* 14.8* 13.4*  NEUTROABS 12.2* 12.7* 12.1* 13.6* 10.9*  HGB 12.0 12.3 11.6* 11.6* 11.1*  HCT 38.6 38.9 37.0 37.1 36.1  MCV 94.8 93.5 93.7 95.4 96.5  PLT 439* 420* 349 314 338   Basic Metabolic Panel: Recent Labs  Lab 03/06/22 0638 03/07/22 0555 03/08/22 0350 03/09/22 1252 03/10/22 0845  NA 138 140 140 137 133*  K 4.0 3.6 3.7 4.1 3.2*  CL 99 97* 98 95* 97*  CO2 31 32 32 31 28  GLUCOSE 61* 80 64* 209* 218*  BUN 33* 38* 40* 36* 27*  CREATININE 0.67 0.82 0.78 0.70 0.67  CALCIUM 9.0 9.5 9.3 8.9 8.7*  MG 2.0 2.3 2.4 2.3 2.2  PHOS 4.5 5.1* 4.3 3.5 3.3   GFR: Estimated Creatinine Clearance: 45.2 mL/min (by C-G formula based on SCr of 0.67 mg/dL). Liver Function Tests: Recent Labs  Lab 03/06/22 0638 03/07/22 0555 03/08/22 0350 03/09/22 1252 03/10/22 0845  AST 63* 41 33 28 27  ALT 94* 88* 7 61* 52*  ALKPHOS 68 71 69 77 68  BILITOT 0.6 0.5 0.6 0.6 0.5  PROT 7.5 8.0 7.6 8.0 7.4  ALBUMIN 3.1* 3.4* 3.1* 3.2* 3.0*   No results for input(s): "LIPASE", "AMYLASE" in the last 168 hours. No results for input(s): "AMMONIA" in the last 168 hours. Coagulation Profile: No results for input(s): "INR", "PROTIME" in the last 168 hours. Cardiac Enzymes: No results for input(s): "CKTOTAL", "CKMB", "CKMBINDEX", "TROPONINI" in the last 168 hours. BNP (last 3 results) No results for input(s): "PROBNP" in the last 8760 hours. HbA1C: No results for input(s): "HGBA1C" in the last 72 hours. CBG: Recent Labs  Lab 03/09/22  1153 03/09/22 1547 03/09/22 2104 03/10/22 0833 03/10/22 1307  GLUCAP 195* 216* 282* 134* 313*   Lipid Profile: No results for input(s): "CHOL", "HDL", "LDLCALC", "TRIG", "CHOLHDL", "LDLDIRECT" in the last 72 hours. Thyroid Function Tests: No results for input(s): "TSH", "T4TOTAL", "FREET4", "T3FREE",  "THYROIDAB" in the last 72 hours. Anemia Panel: No results for input(s): "VITAMINB12", "FOLATE", "FERRITIN", "TIBC", "IRON", "RETICCTPCT" in the last 72 hours. Sepsis Labs: Recent Labs  Lab 03/04/22 0558  PROCALCITON <0.10    Recent Results (from the past 240 hour(s))  MRSA Next Gen by PCR, Nasal     Status: None   Collection Time: 02/28/22  7:13 PM   Specimen: Nasal Mucosa; Nasal Swab  Result Value Ref Range Status   MRSA by PCR Next Gen NOT DETECTED NOT DETECTED Final    Comment: (NOTE) The GeneXpert MRSA Assay (FDA approved for NASAL specimens only), is one component of a comprehensive MRSA colonization surveillance program. It is not intended to diagnose MRSA infection nor to guide or monitor treatment for MRSA infections. Test performance is not FDA approved in patients less than 65 years old. Performed at [facility], [place] [address], [place], [place] [address]     Radiology Studies: DG CHEST PORT 1 VIEW  Result Date: 03/10/2022 CLINICAL DATA:  71 year old female presenting with shortness of breath. EXAM: PORTABLE CHEST 1 VIEW COMPARISON:  March 08, 2022 FINDINGS: RIGHT IJ Port-A-Cath terminates at the upper RIGHT atrium similar to prior imaging. Heart size stable and accentuated by portable technique and AP projection. Obscured LEFT heart border on frontal view with interstitial and alveolar opacities throughout the LEFT lower chest. Patchy opacities also at the RIGHT lung base. No pneumothorax. On limited assessment no acute skeletal process. IMPRESSION: 1. Findings suspicious for lingular pneumonia, potentially with early involvement of the RIGHT lung base. 2. RIGHT IJ Port-A-Cath terminates at the upper RIGHT atrium. Electronically Signed   By: Zetta Bills M.D.   On: 03/10/2022 08:13    Scheduled Meds:  bisacodyl  10 mg Rectal Daily   budesonide (PULMICORT) nebulizer solution  0.5 mg Nebulization BID   Chlorhexidine Gluconate Cloth  6 each Topical  Daily   enoxaparin (LOVENOX) injection  50 mg Subcutaneous BID   feeding supplement  237 mL Oral BID BM   fluconazole  100 mg Oral Daily   FLUoxetine  20 mg Oral Daily   fluticasone  2 spray Each Nare Daily   furosemide  40 mg Intravenous Daily   insulin aspart  0-20 Units Subcutaneous TID WC   insulin aspart  0-5 Units Subcutaneous QHS   insulin aspart  4 Units Subcutaneous TID WC   insulin glargine-yfgn  28 Units Subcutaneous QHS   ipratropium-albuterol  3 mL Nebulization TID   levothyroxine  125 mcg Oral Q0600   loratadine  10 mg Oral Daily   mouth rinse  15 mL Mouth Rinse 4 times per day   oxyCODONE  10 mg Oral Q12H   pantoprazole  40 mg Oral Daily   polyethylene glycol  17 g Oral BID   potassium chloride  40 mEq Oral Once   Followed by   Derrill Memo ON 03/11/2022] potassium chloride  40 mEq Oral Daily   potassium chloride  40 mEq Oral Once   predniSONE  40 mg Oral Q breakfast   Followed by   Derrill Memo ON 03/13/2022] predniSONE  30 mg Oral Q breakfast   Followed by   Derrill Memo ON 03/17/2022] predniSONE  20 mg Oral Q  breakfast   Followed by   Derrill Memo ON 03/21/2022] predniSONE  10 mg Oral Q breakfast   Followed by   Derrill Memo ON 03/25/2022] predniSONE  5 mg Oral Q breakfast   Followed by   Derrill Memo ON 03/29/2022] predniSONE  2.5 mg Oral Q breakfast   senna-docusate  2 tablet Oral BID   sulfamethoxazole-trimethoprim  1 tablet Oral Once per day on Mon Wed Fri   Continuous Infusions:   LOS: 12 days   Raiford Noble, DO [facility] Available via Epic secure chat 7am-7pm After these hours, please refer to coverage provider listed on [email or web address] 03/10/2022, 5:16 PM

## 2022-03-10 NOTE — Care Management Important Message (Signed)
Important Message  Patient Details IM Letter placed in Patient's room. Name: [name] MRN: [id number] Date of Birth: [birth year]   Medicare Important Message Given:  Yes     Kerin Salen 03/10/2022, 12:54 PM

## 2022-03-11 ENCOUNTER — Inpatient Hospital Stay (HOSPITAL_COMMUNITY): Payer: PPO

## 2022-03-11 ENCOUNTER — Inpatient Hospital Stay: Payer: PPO

## 2022-03-11 ENCOUNTER — Telehealth: Payer: Self-pay | Admitting: Pulmonary Disease

## 2022-03-11 ENCOUNTER — Inpatient Hospital Stay: Payer: PPO | Admitting: Hematology & Oncology

## 2022-03-11 DIAGNOSIS — J9601 Acute respiratory failure with hypoxia: Secondary | ICD-10-CM | POA: Diagnosis not present

## 2022-03-11 DIAGNOSIS — I2699 Other pulmonary embolism without acute cor pulmonale: Secondary | ICD-10-CM | POA: Diagnosis not present

## 2022-03-11 DIAGNOSIS — E278 Other specified disorders of adrenal gland: Secondary | ICD-10-CM | POA: Insufficient documentation

## 2022-03-11 DIAGNOSIS — L989 Disorder of the skin and subcutaneous tissue, unspecified: Secondary | ICD-10-CM

## 2022-03-11 LAB — CBC WITH DIFFERENTIAL/PLATELET
Abs Immature Granulocytes: 0.09 K/uL — ABNORMAL HIGH (ref 0.00–0.07)
Basophils Absolute: 0 K/uL (ref 0.0–0.1)
Basophils Relative: 0 %
Eosinophils Absolute: 0.1 K/uL (ref 0.0–0.5)
Eosinophils Relative: 1 %
HCT: 35.6 % — ABNORMAL LOW (ref 36.0–46.0)
Hemoglobin: 11 g/dL — ABNORMAL LOW (ref 12.0–15.0)
Immature Granulocytes: 1 %
Lymphocytes Relative: 13 %
Lymphs Abs: 1.4 K/uL (ref 0.7–4.0)
MCH: 29.7 pg (ref 26.0–34.0)
MCHC: 30.9 g/dL (ref 30.0–36.0)
MCV: 96.2 fL (ref 80.0–100.0)
Monocytes Absolute: 0.8 K/uL (ref 0.1–1.0)
Monocytes Relative: 7 %
Neutro Abs: 8.6 K/uL — ABNORMAL HIGH (ref 1.7–7.7)
Neutrophils Relative %: 78 %
Platelets: 274 K/uL (ref 150–400)
RBC: 3.7 MIL/uL — ABNORMAL LOW (ref 3.87–5.11)
RDW: 13.9 % (ref 11.5–15.5)
WBC: 11 K/uL — ABNORMAL HIGH (ref 4.0–10.5)
nRBC: 0 % (ref 0.0–0.2)

## 2022-03-11 LAB — COMPREHENSIVE METABOLIC PANEL WITH GFR
ALT: 45 U/L — ABNORMAL HIGH (ref 0–44)
AST: 23 U/L (ref 15–41)
Albumin: 2.9 g/dL — ABNORMAL LOW (ref 3.5–5.0)
Alkaline Phosphatase: 62 U/L (ref 38–126)
Anion gap: 6 (ref 5–15)
BUN: 25 mg/dL — ABNORMAL HIGH (ref 8–23)
CO2: 31 mmol/L (ref 22–32)
Calcium: 8.7 mg/dL — ABNORMAL LOW (ref 8.9–10.3)
Chloride: 103 mmol/L (ref 98–111)
Creatinine, Ser: 0.59 mg/dL (ref 0.44–1.00)
GFR, Estimated: >60 mL/min
Glucose, Bld: 87 mg/dL (ref 70–99)
Potassium: 4.5 mmol/L (ref 3.5–5.1)
Sodium: 140 mmol/L (ref 135–145)
Total Bilirubin: 0.4 mg/dL (ref 0.3–1.2)
Total Protein: 7 g/dL (ref 6.5–8.1)

## 2022-03-11 LAB — MAGNESIUM: Magnesium: 2.3 mg/dL (ref 1.7–2.4)

## 2022-03-11 LAB — GLUCOSE, CAPILLARY
Glucose-Capillary: 68 mg/dL — ABNORMAL LOW (ref 70–99)
Glucose-Capillary: 243 mg/dL — ABNORMAL HIGH (ref 70–99)
Glucose-Capillary: 325 mg/dL — ABNORMAL HIGH (ref 70–99)
Glucose-Capillary: 331 mg/dL — ABNORMAL HIGH (ref 70–99)

## 2022-03-11 LAB — PHOSPHORUS: Phosphorus: 2.6 mg/dL (ref 2.5–4.6)

## 2022-03-11 NOTE — Progress Notes (Signed)
Progress Note    [name]   KPT:[id number]  DOB: 1951/04/01  DOA: 02/25/2022     13 PCP: [name], DO  Initial CC: SOB, hypoxia  Hospital Course: Ms. 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  [name] was found to be febrile and hypoxic on workup. COVID, flu, and RVP were negative on admission. CTA chest on admission was notable for PE involving right upper lung distal segmental and subsegmental vessels.  There was also concern for underlying airspace disease concerning for multifocal pneumonia. Pulmonology was consulted. Oxygen was slowly weaned throughout hospitalization. [name] was treated with anticoagulation which was transitioned to Lovenox per oncology recommendations.  Steroid taper also continued at discharge.  Antibiotics were completed during hospitalization.  Interval History:  No events overnight.  Patient seen this morning and planning on working with PT later in the day.  Still on oxygen but has been slowly weaning.  Planning to also ambulate as well while checking saturations. Denies any chest pain, cough, shortness of breath.  Overall [name] feels better.  [name] is hesitant on going home due to feeling weak/deconditioned and wants to see how [name] does with therapy.  We discussed probable discharge home tomorrow.  Assessment and Plan: * Acute respiratory failure with hypoxia ([place]) - etiology presumed due to underlying PNA, radiation pneumonitis and/or some contribution from PE (though lower suspicion) - weaning off O2 prior to discharge -O2 89% with ambulating (borderline); repeat ambulating O2 tomorrow   Pulmonary embolism (HCC) - CTA chest on admission: Positive examination for pulmonary emboli demonstrated focally in right upper lung distal segmental and subsegmental vessels. Developing patchy airspace and  interstitial infiltrates in the lungs most likely representing multifocal pneumonia. Prominent mediastinal and abdominal lymph nodes, likely metastatic. Multiple bone metastases again demonstrated. - echo obtained 11/25: EF 60-65%, Gr 1 DD. 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 Followed by oncology. Had 4 sessions of XRT in October 2023. Last session on 01-08-2022. Last received IV chemo July 2023. -Oncology following.  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  Anemia -Patient with no overt bleeding. -Anemia likely secondary to acute illness and radiation treatment. -Follow H&H. -Transfusion threshold hemoglobin < 7  Hypophosphatemia -Repleted   Hypomagnesemia -Repleted  Hypokalemia -Repleted  Constipation -Continue current bowel regimen  Hyponatremia-resolved as of 03/11/2022 - TSH 2.597.  -Free T4  1.28.   -Urine sodium 142, urine potassium 27,  -Concern hyponatremia may be secondary to small cell lung cancer.  - Na now stable   Old records reviewed in assessment of this patient  DVT prophylaxis:  SCDs Start: 02/26/22 0404   Code Status:   Code Status: DNR  Mobility Assessment (  last 72 hours)     Mobility Assessment     Row Name 03/11/22 1602  03/11/22 1532 03/11/22 0928 03/10/22 1953 03/10/22 0900   Does patient have an order for bedrest or is patient medically unstable -- -- No - Continue assessment No - Continue assessment No - Continue assessment   What is the highest level of mobility based on the progressive mobility assessment? Level 5 (Walks with assist in room/hall) - Balance while stepping forward/back and can walk in room with assist - Complete Level 5 (Walks with assist in room/hall) - Balance while stepping forward/back and can walk in room with assist - Complete Level 5 (Walks with assist in room/hall) - Balance while stepping forward/back and can walk in room with assist - Complete -- Level 4 (Walks with assist in room) - Balance while marching in place and cannot step forward and back - Complete    Row Name 03/09/22 1308           What is the highest level of mobility based on the progressive mobility assessment? Level 4 (Walks with assist in room) - Balance while marching in place and cannot step forward and back - Complete                Barriers to discharge:  Disposition Plan: Home with home health Status is: Inpatient  Objective: Blood pressure 106/67, pulse (!) 101, temperature 98.3 F (36.8 C), temperature source Oral, resp. rate 18, height 4' 11.5" (1.511 m), weight 48.5 kg, SpO2 94 %.  Examination:  Physical Exam Constitutional:      General: [name] is not in acute distress.    Appearance: Normal appearance.  HENT:     Head: Normocephalic and atraumatic.     Mouth/Throat:     Mouth: Mucous membranes are moist.  Eyes:     Extraocular Movements: Extraocular movements intact.  Cardiovascular:     Rate and Rhythm: Normal rate and regular rhythm.  Pulmonary:     Effort: Pulmonary effort is normal. No respiratory distress.     Breath sounds: No wheezing.  Abdominal:     General: Bowel sounds are normal. There is no distension.     Palpations: Abdomen is soft.     Tenderness: There is no abdominal  tenderness.  Musculoskeletal:        General: Normal range of motion.     Cervical back: Normal range of motion and neck supple.  Skin:    General: Skin is warm.  Neurological:     General: No focal deficit present.     Mental Status: [name] is alert.  Psychiatric:        Mood and Affect: Mood normal.      Consultants:  Pulmonology  Procedures:    Data Reviewed: Results for orders placed or performed during the hospital encounter of 02/25/22 (from the past 24 hour(s))  Glucose, capillary     Status: Abnormal   Collection Time: 03/10/22  8:21 PM  Result Value Ref Range   Glucose-Capillary 106 (H) 70 - 99 mg/dL  CBC with Differential/Platelet     Status: Abnormal   Collection Time: 03/11/22  5:02 AM  Result Value Ref Range   WBC 11.0 (H) 4.0 - 10.5 K/uL   RBC 3.70 (L) 3.87 - 5.11 MIL/uL   Hemoglobin 11.0 (L) 12.0 - 15.0 g/dL   HCT 35.6 (L) 36.0 - 46.0 %   MCV 96.2 80.0 - 100.0 fL   MCH 29.7 26.0 - 34.0 pg  MCHC 30.9 30.0 - 36.0 g/dL   RDW 13.9 11.5 - 15.5 %   Platelets 274 150 - 400 K/uL   nRBC 0.0 0.0 - 0.2 %   Neutrophils Relative % 78 %   Neutro Abs 8.6 (H) 1.7 - 7.7 K/uL   Lymphocytes Relative 13 %   Lymphs Abs 1.4 0.7 - 4.0 K/uL   Monocytes Relative 7 %   Monocytes Absolute 0.8 0.1 - 1.0 K/uL   Eosinophils Relative 1 %   Eosinophils Absolute 0.1 0.0 - 0.5 K/uL   Basophils Relative 0 %   Basophils Absolute 0.0 0.0 - 0.1 K/uL   Immature Granulocytes 1 %   Abs Immature Granulocytes 0.09 (H) 0.00 - 0.07 K/uL  Comprehensive metabolic panel     Status: Abnormal   Collection Time: 03/11/22  5:02 AM  Result Value Ref Range   Sodium 140 135 - 145 mmol/L   Potassium 4.5 3.5 - 5.1 mmol/L   Chloride 103 98 - 111 mmol/L   CO2 31 22 - 32 mmol/L   Glucose, Bld 87 70 - 99 mg/dL   BUN 25 (H) 8 - 23 mg/dL   Creatinine, Ser 0.59 0.44 - 1.00 mg/dL   Calcium 8.7 (L) 8.9 - 10.3 mg/dL   Total Protein 7.0 6.5 - 8.1 g/dL   Albumin 2.9 (L) 3.5 - 5.0 g/dL   AST 23 15 - 41 U/L    ALT 45 (H) 0 - 44 U/L   Alkaline Phosphatase 62 38 - 126 U/L   Total Bilirubin 0.4 0.3 - 1.2 mg/dL   GFR, Estimated >60 >60 mL/min   Anion gap 6 5 - 15  Phosphorus     Status: None   Collection Time: 03/11/22  5:02 AM  Result Value Ref Range   Phosphorus 2.6 2.5 - 4.6 mg/dL  Magnesium     Status: None   Collection Time: 03/11/22  5:02 AM  Result Value Ref Range   Magnesium 2.3 1.7 - 2.4 mg/dL  Glucose, capillary     Status: Abnormal   Collection Time: 03/11/22  7:54 AM  Result Value Ref Range   Glucose-Capillary 68 (L) 70 - 99 mg/dL  Glucose, capillary     Status: Abnormal   Collection Time: 03/11/22 11:26 AM  Result Value Ref Range   Glucose-Capillary 243 (H) 70 - 99 mg/dL  Glucose, capillary     Status: Abnormal   Collection Time: 03/11/22  4:24 PM  Result Value Ref Range   Glucose-Capillary 325 (H) 70 - 99 mg/dL    I have Reviewed nursing notes, Vitals, and Lab results since pt's last encounter. Pertinent lab results : see above I have ordered test including BMP, CBC, Mg I have reviewed the last note from staff over past 24 hours I have discussed pt's care plan and test results with nursing staff, case manager  Time spent: Greater than 50% of the 55 minute visit was spent in counseling/coordination of care for the patient as laid out in the A&P.    LOS: 13 days   Dwyane Dee, MD [facility] 03/11/2022, 5:11 PM

## 2022-03-11 NOTE — Progress Notes (Signed)
Occupational Therapy Treatment Patient Details Name: [name] MRN: [id number] DOB: [birth year] Today's Date: 03/11/2022   History of present illness 71 year old female who presented to the ED 02/25/22  with hypoxemia. PTA had 1 week URI associated with myalgias , chills, fever and urinary frequency and diarrhea.  CTA demonstrated small segmental and segmental PE in RUL and patchy infiltrates bilaterally concerning for pneumonia. PMH: Lung cancer with livr andbone mets  DM, OSA.back surgery, transfer to ICU 11/25 for worsening respiratory status.   OT comments  The session emphasized progressive ADL participation and ADL instruction as needed. Pt performed grooming in standing at the sink, toileting at bathroom level, and lower body dressing seated in the chair with supervision. She reported compromised endurance and slight weakness, as compared to her baseline. Her O2 saturation was 92% on room air at rest and 89% on room air with functional activity. She was educated on implementing pursed lip breathing as needed during activity, and implementing energy conservation strategies as needed during ADLs (e.g., therapeutic rest breaks, use of shower chair when bathing...). She will continue to benefit from further OT services to maximize her independence with self-care tasks.   Recommendations for follow up therapy are one component of a multi-disciplinary discharge planning process, led by the attending physician.  Recommendations may be updated based on patient status, additional functional criteria and insurance authorization.    Follow Up Recommendations  No OT follow up     Assistance Recommended at Discharge Intermittent Supervision/Assistance  Patient can return home with the following  Assistance with cooking/housework;Help with stairs or ramp for entrance   Equipment Recommendations  None recommended by OT       Precautions / Restrictions Precautions Precautions: Fall Precaution  Comments: monitor HR, O2 Restrictions Weight Bearing Restrictions: No       Mobility Bed Mobility        General bed mobility comments: pt received seated in the bedside chair    Transfers Overall transfer level: Needs assistance Equipment used: Rolling walker (2 wheels) Transfers: Sit to/from Stand Sit to Stand: Supervision                     ADL either performed or assessed with clinical judgement   ADL Overall ADL's : Needs assistance/impaired Eating/Feeding: Independent Eating/Feeding Details (indicate cue type and reason): based on clinical judgement Grooming: Oral care;Standing Grooming Details (indicate cue type and reason): She performed teeth brushing at sink level.         Upper Body Dressing : Set up;Sitting   Lower Body Dressing: Set up;Supervision/safety Lower Body Dressing Details (indicate cue type and reason): She doffed then donned her socks seated in the chair Toilet Transfer: Supervision/safety;Rolling walker (2 wheels);Ambulation Toilet Transfer Details (indicate cue type and reason): She held onto grab bar for added support when transferring onto & off the toilet. Toileting- Water quality scientist and Hygiene: Min guard;Sit to/from stand Toileting - Clothing Manipulation Details (indicate cue type and reason): She performed seated hygiene with SBA, then required min guard assist in standing while she performed clothing management.              Cognition   Behavior During Therapy: WFL for tasks assessed/performed Overall Cognitive Status: Within Functional Limits for tasks assessed                            Pertinent Vitals/ Pain       Pain  Assessment Pain Assessment: No/denies pain         Frequency  Min 2X/week        Progress Toward Goals  OT Goals(current goals can now be found in the care plan section)  Progress towards OT goals: Progressing toward goals  Acute Rehab OT Goals OT Goal Formulation: With  patient Time For Goal Achievement: 03/20/22 Potential to Achieve Goals: Good  Plan Discharge plan remains appropriate       AM-PAC OT "6 Clicks" Daily Activity     Outcome Measure   Help from another person eating meals?: None Help from another person taking care of personal grooming?: None Help from another person toileting, which includes using toliet, bedpan, or urinal?: A Little Help from another person bathing (including washing, rinsing, drying)?: A Little Help from another person to put on and taking off regular upper body clothing?: None Help from another person to put on and taking off regular lower body clothing?: None 6 Click Score: 22    End of Session Equipment Utilized During Treatment: Rolling walker (2 wheels)  OT Visit Diagnosis: Muscle weakness (generalized) (M62.81)   Activity Tolerance Patient tolerated treatment well   Patient Left in chair;with family/visitor present   Nurse Communication Mobility status        Time: 4163-8453 OT Time Calculation (min): 19 min  Charges: OT General Charges $OT Visit: 1 Visit OT Treatments $Self Care/Home Management : 8-22 mins     Leota Sauers, OTR/L 03/11/2022, 4:55 PM

## 2022-03-11 NOTE — Progress Notes (Addendum)
NAME:  [name], MRN:  [id number], DOB:  [birth year], LOS: 65 ADMISSION DATE:  02/25/2022, CONSULTATION DATE:  02/26/22 REFERRING MD:  Irine Seal, MD CHIEF COMPLAINT:  Acute hypoxemic respiratory failure   History of Present Illness:  71 year old female former smoker (50 pack-years, quit 2018) who presented to the ED and found with hypoxemia. PTA had 1 week URI associated with myalgias, chills, fever and urinary frequency and diarrhea. COVID neg at home. She continued to feel poorly and presented to urgent care with SpO2 89% so was advised to present to the ED. CTA demonstrated small segmental and segmental PE in RUL and patchy infiltrates bilaterally concerning for pneumonia. RVP and COVID/influenza neg. Started on supplemental oxygen, ceftriaxone and azithromycin and heparin. PCCM consulted.  She reports she is active at baseline with no limitations in activity. Followed by Dr. Lamonte Sakai for her diagnosis of lung cancer but did not need any bronchodilators. Last radiation treatment was in October 2023. No immediate issues post-procedure. URI symptoms one week ago as noted above. No wheezing or productive cough.  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 Hiatal hernia. Unchanged lymphadenopathy. Emphysema. Patchy GGO bilaterally. Prior bone mets seen. 11/25 Transferred to SDU for BiPAP for pulmonary edema 12/3 MR Liver >> 1.3cm left adrenal mass suspicious for metastasis. 2.3cm liver mass unclear etiology  Interim History / Subjective:  Pt reports she is worried b/c she has not been out of bed Afebrile   O2 turned off / RN aware. Pending PT and O2 needs assessment  Objective   Blood pressure 106/67, pulse (!) 101, temperature 98.3 F (36.8 C), temperature source Oral, resp. rate 18, height 4' 11.5" (1.511 m), weight 48.5 kg, SpO2 95 %.        Intake/Output Summary (Last 24 hours) at 03/11/2022 1246 Last data filed at 03/11/2022 1201 Gross per 24 hour  Intake 720 ml  Output 1350 ml  Net -630 ml   Filed Weights   03/07/22 0647 03/10/22 0500 03/11/22 0444  Weight: 50.3 kg 51.4 kg 48.5 kg   Physical Exam: General: elderly female sitting up in bed in NAD  HEENT: MM pink/moist, anicteric, herpetic lesion on lip, pupils reactive  Neuro: AAOx4, speech clear, MAE CV: s1s2 RRR, no m/r/g PULM: non-labored at rest, lungs bilaterally with faint basilar crackles GI: soft, bsx4 active  Extremities: warm/dry, no edema  Skin: no rashes or lesions  Resolved Hospital Problem list     Assessment & Plan:   Acute Hypoxemic Respiratory Failure multifactorial 2/2 ?radiation pneumonitis, ?aspiration, pulmonary embolus, now acute pulmonary edema, some improvement with diuresis +steroids.  Some of this is lack of mobility and derecruitment. Baseline abnormal lung architecture - prior Cts show bronchiectasis in lower lobes. 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, questionable new liver met DM2- poorly controlled with hyperglycemia, exacerbated by steroids Elevated LFT's   -lasix IV per TRH as renal function / BP permit.   -replace electrolytes as indicated -Bactrim for PJP prophylaxis until she reaches steroid dose of 32m  -wean O2 to off for sats >90% -assess  ambulatory O2 needs assessment in am  -PT/OT  -pulmonary follow up arranged for discharge  -no new pulmonary meds at discharge needed, resume duoneb  -insulin per primary    PCCM will be available PRN. Please call back if new needs arise.    Best Practice (right click and "Reselect all SmartList Selections" daily)  Per primary  Critical care time: n/a    Noe Gens, MSN, APRN, NP-C, AGACNP-BC [facility] 03/11/2022, 12:46 PM   Please see [email or web address] for pager details.   From 7A-7P if no response, please call [phone] After hours, please call ELink [phone]

## 2022-03-11 NOTE — Progress Notes (Signed)
Overall, [name] might be feeling a little bit better.  She had a chest x-ray yesterday which showed what appeared to be a lingular pneumonia.  She says that she might go home today.  She is not sure if she is ready to go home today.  She is really not out of bed.  I am unsure if she is taking a physical therapy.  I think she may be having some urinary retention.  She is having a bladder scan this morning.  Her labs show white cell count of 11,000.  Hemoglobin 11.  Her platelet count is adequate 274,000.  Her sodium is 140.  Potassium 4.5.  BUN 25 creatinine 0.59.  Calcium 8.7 with an albumin of 2.9.  She is eating well.  I am sure the steroids are some to do with that.  She does not complain of any pain.  There is no obvious bleeding.  Her vital signs show temperature of 98.9.  Pulse 90.  Blood pressure 129/83.  Her head neck exam shows no ocular or oral lesions.  She has a healing what appears to be herpetic lesions on the lower lip on the right side.  There is no adenopathy in the neck.  Lungs sound relatively clear bilaterally.  There may be some slight decrease over on the right side.  No wheezes are noted.  Cardiac exam regular rate and rhythm.  She has no murmurs, rubs or bruits.  Abdomen is soft.  Extremity shows no clubbing, cyanosis or edema.  [name] has this pneumonia.  Again is hard to know if this is community-acquired.  It is hard to know if this is radiation pneumonitis.  She is on steroids.  Again, it is not clear whether she will go home today or not.  I told her that the decision really was not of to me.  I am sure that the hospital doctor will make sure that she is ready to go home.   [name], MD  Oswaldo Milian 7:14

## 2022-03-11 NOTE — Assessment & Plan Note (Signed)
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

## 2022-03-11 NOTE — Assessment & Plan Note (Signed)
-  She has a new lesion noted about 3 weeks ago -Will need outpatient dermatology to further evaluate and biopsy.

## 2022-03-11 NOTE — Progress Notes (Addendum)
Physical Therapy Treatment Patient Details Name: [name] MRN: [id number] DOB: 14-Aug-1950 Today's Date: 03/11/2022   SATURATION QUALIFICATIONS: (This note is used to comply with regulatory documentation for home oxygen)  Patient Saturations on Room Air at Rest = 91%  Patient Saturations on Room Air while Ambulating = 89%  Patient Saturations on 2 Liters of oxygen while Ambulating = 93%     History of Present Illness 71 year old female who presented to the ED 02/25/22  with hypoxemia. PTA had 1 week URI associated with myalgias , chills, fever and urinary frequency and diarrhea.  CTA demonstrated small segmental and segmental PE in RUL and patchy infiltrates bilaterally concerning for pneumonia. PMH: Lung cancer with livr andbone mets  DM, OSA.back surgery, transfer to ICU 11/25 for worsening respiratory status.    PT Comments    Progressing well with mobility.   Recommendations for follow up therapy are one component of a multi-disciplinary discharge planning process, led by the attending physician.  Recommendations may be updated based on patient status, additional functional criteria and insurance authorization.  Follow Up Recommendations  Home health PT     Assistance Recommended at Discharge Frequent or constant Supervision/Assistance  Patient can return home with the following A little help with walking and/or transfers;A little help with bathing/dressing/bathroom;Help with stairs or ramp for entrance;Assistance with cooking/housework;Assist for transportation   Equipment Recommendations   (pt states she has dme)    Recommendations for Other Services       Precautions / Restrictions Precautions Precautions: Fall Precaution Comments: monitor HR, O2 Restrictions Weight Bearing Restrictions: No     Mobility  Bed Mobility Overal bed mobility: Needs Assistance       Supine to sit: Supervision     General bed mobility comments: increased time. supv for  safety, lines. pt denied dizziness.    Transfers Overall transfer level: Needs assistance Equipment used: Rolling walker (2 wheels) Transfers: Sit to/from Stand Sit to Stand: Min guard           General transfer comment: min guard for safety.    Ambulation/Gait Ambulation/Gait assistance: Min guard Gait Distance (Feet): 115 Feet (x2) Assistive device: Rolling walker (2 wheels) Gait Pattern/deviations: Step-through pattern, Decreased stride length       General Gait Details: min guard for safety. dyspnea 2/4. O2 89% on RA, 93% on 2L. HR 135 bpm   Stairs             Wheelchair Mobility    Modified Rankin (Stroke Patients Only)       Balance Overall balance assessment: Needs assistance         Standing balance support: Bilateral upper extremity supported, Reliant on assistive device for balance, During functional activity Standing balance-Leahy Scale: Fair                              Cognition Arousal/Alertness: Awake/alert Behavior During Therapy: WFL for tasks assessed/performed Overall Cognitive Status: Within Functional Limits for tasks assessed                                          Exercises      General Comments        Pertinent Vitals/Pain Pain Assessment Pain Assessment: No/denies pain    Home Living  Prior Function            PT Goals (current goals can now be found in the care plan section) Progress towards PT goals: Progressing toward goals    Frequency    Min 3X/week      PT Plan Current plan remains appropriate    Co-evaluation              AM-PAC PT "6 Clicks" Mobility   Outcome Measure  Help needed turning from your back to your side while in a flat bed without using bedrails?: A Little Help needed moving from lying on your back to sitting on the side of a flat bed without using bedrails?: A Little Help needed moving to and from a bed to  a chair (including a wheelchair)?: A Little Help needed standing up from a chair using your arms (e.g., wheelchair or bedside chair)?: A Little Help needed to walk in hospital room?: A Little Help needed climbing 3-5 steps with a railing? : A Little 6 Click Score: 18    End of Session Equipment Utilized During Treatment: Gait belt;Oxygen Activity Tolerance: Patient tolerated treatment well Patient left: in chair;with call bell/phone within reach;with family/visitor present   PT Visit Diagnosis: Unsteadiness on feet (R26.81);Difficulty in walking, not elsewhere classified (R26.2)     Time: 6195-0932 PT Time Calculation (min) (ACUTE ONLY): 28 min  Charges:  $Gait Training: 23-37 mins                        Doreatha Massed, PT Acute Rehabilitation  Office: [phone]

## 2022-03-11 NOTE — Telephone Encounter (Signed)
Please arrange for hospital follow up with CXR in office in 2 weeks.  Anticipate discharge in am 12/7. Call with questions.      Noe Gens, MSN, APRN, NP-C, AGACNP-BC [facility] 03/11/2022, 12:58 PM   Please see [email or web address] for pager details.   From 7A-7P if no response, please call [phone] After hours, please call ELink [phone]

## 2022-03-11 NOTE — Inpatient Diabetes Management (Signed)
Inpatient Diabetes Program Recommendations  AACE/ADA: New Consensus Statement on Inpatient Glycemic Control (2015)  Target Ranges:  Prepandial:   less than 140 mg/dL      Peak postprandial:   less than 180 mg/dL (1-2 hours)      Critically ill patients:  140 - 180 mg/dL   Lab Results  Component Value Date   GLUCAP 68 (L) 03/11/2022   HGBA1C 10.4 (H) 02/26/2022    Review of Glycemic Control  Diabetes history: DM2 Current orders for Inpatient glycemic control: Semglee 28 QHS, Novolog 0-20 units TID with meals and 0-5 HS + 4 units TID On Pred 40 mg QD  Hypo this am of 68 mg/dL.  Inpatient Diabetes Program Recommendations:    Consider decreasing Semglee to 24 units QHS  Continues with steroid taper.  Follow glucose trends.  Thank you. Lorenda Peck, RD, LDN, [place] Inpatient Diabetes Coordinator [phone]

## 2022-03-12 ENCOUNTER — Other Ambulatory Visit (HOSPITAL_COMMUNITY): Payer: Self-pay

## 2022-03-12 DIAGNOSIS — I2699 Other pulmonary embolism without acute cor pulmonale: Secondary | ICD-10-CM | POA: Diagnosis not present

## 2022-03-12 DIAGNOSIS — C778 Secondary and unspecified malignant neoplasm of lymph nodes of multiple regions: Secondary | ICD-10-CM | POA: Diagnosis not present

## 2022-03-12 DIAGNOSIS — J9601 Acute respiratory failure with hypoxia: Secondary | ICD-10-CM | POA: Diagnosis not present

## 2022-03-12 DIAGNOSIS — C3490 Malignant neoplasm of unspecified part of unspecified bronchus or lung: Secondary | ICD-10-CM | POA: Diagnosis not present

## 2022-03-12 LAB — GLUCOSE, CAPILLARY
Glucose-Capillary: 46 mg/dL — ABNORMAL LOW (ref 70–99)
Glucose-Capillary: 50 mg/dL — ABNORMAL LOW (ref 70–99)
Glucose-Capillary: 225 mg/dL — ABNORMAL HIGH (ref 70–99)
Glucose-Capillary: 346 mg/dL — ABNORMAL HIGH (ref 70–99)

## 2022-03-12 MED ORDER — ENOXAPARIN SODIUM 80 MG/0.8ML IJ SOSY
70.0000 mg | PREFILLED_SYRINGE | Freq: Every day | INTRAMUSCULAR | Status: DC
  Administered 2022-03-12: 70 mg via SUBCUTANEOUS
  Filled 2022-03-12: qty 0.8

## 2022-03-12 MED ORDER — HEPARIN SOD (PORK) LOCK FLUSH 100 UNIT/ML IV SOLN
500.0000 [IU] | INTRAVENOUS | Status: AC | PRN
  Administered 2022-03-12: 500 [IU]
  Filled 2022-03-12: qty 5

## 2022-03-12 MED ORDER — PREDNISONE 10 MG PO TABS: ORAL_TABLET | ORAL | 0 refills | Status: DC

## 2022-03-12 MED ORDER — ACETAMINOPHEN 325 MG PO TABS: 325.0000 mg | ORAL_TABLET | Freq: Four times a day (QID) | ORAL | Status: DC | PRN

## 2022-03-12 MED ORDER — SULFAMETHOXAZOLE-TRIMETHOPRIM 800-160 MG PO TABS: 1.0000 | ORAL_TABLET | ORAL | 2 refills | Status: DC

## 2022-03-12 MED ORDER — INSULIN GLARGINE-YFGN 100 UNIT/ML ~~LOC~~ SOLN
10.0000 [IU] | Freq: Every day | SUBCUTANEOUS | Status: DC
  Filled 2022-03-12: qty 0.1

## 2022-03-12 MED ORDER — ENOXAPARIN SODIUM 80 MG/0.8ML IJ SOSY: 70.0000 mg | PREFILLED_SYRINGE | Freq: Every day | INTRAMUSCULAR | 3 refills | Status: DC

## 2022-03-12 NOTE — Progress Notes (Signed)
Mobility Specialist - Progress Note  ([place] 2L) Pre-mobility:  122 bpm HR, 96% SpO2 During mobility: 153 bpm HR, 90% SpO2 Post-mobility: 123 bpm HR, 95% SPO2   03/12/22 0931  Mobility  Activity Ambulated with assistance in hallway  Level of Assistance Standby assist, set-up cues, supervision of patient - no hands on  Assistive Device Front wheel walker  Distance Ambulated (ft) 230 ft  Range of Motion/Exercises Active  Activity Response Tolerated well  Mobility Referral Yes  $Mobility charge 1 Mobility   Pt was found in bed and eager to ambulate. Pt ambulated 115 ft x 2 with a seated rest break in between. Pt had some coughing and sputum during session. At EOS returned to recliner chair with necessities in reach. RN notified of session.  Ferd Hibbs Mobility Specialist

## 2022-03-12 NOTE — TOC Benefit Eligibility Note (Signed)
Patient Teacher, English as a foreign language completed.    The patient is currently admitted and upon discharge could be taking enoxaparin (Lovenox) 80 mg/0.8 ml.  The current 30 day co-pay is $100.00.   The patient is insured through [place], [place] Patient Advocate Specialist [place] Patient Advocate Team Direct Number: [phone]  Fax: [phone]

## 2022-03-12 NOTE — Plan of Care (Signed)

## 2022-03-12 NOTE — Progress Notes (Signed)
[name] is still in the hospital.  She walked a little bit yesterday.  Hopefully, she will be able to go home before the weekend.  She is feeling little bit better.  She does not feel as short of breath.  She still warrants some oxygen.  She is on a steroid taper.  She has the pulmonary embolism.  She is on Lovenox.  Need to have an outpatient Lovenox but I really think this is the best way of trying to treat her thromboembolic disease.  I know that she is getting twice daily Lovenox.  Maybe, we can give her once a day Lovenox to make a little bit easier for her as an outpatient.  There are no labs back yet today.  Her appetite is still doing okay.  She is having no nausea or vomiting.  He is having no change in bowel or bladder habits.  Her vital signs show temperature 98.4.  Pulse 99.  Blood pressure is 116/76.  Her lungs sound clear bilaterally.  She has good air movement bilaterally.  Cardiac exam regular rate and rhythm.  Abdomen is soft.  Bowel sounds are present.  Extremities shows no clubbing, cyanosis or edema.  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  She was admitted because of what appeared to be radiation pneumonitis/pneumonia.  She had an a pulmonary embolism.  She is on Lovenox right now.  Again we will switch her over to once daily Lovenox.  At some point, we will then switch her over to an oral regimen.  She will walk again today.  Hopefully, she will be able to go home tomorrow.  I do appreciate the incredible care she is getting from everybody on 4 W.   [name], MD  Oswaldo Milian 41:10

## 2022-03-12 NOTE — TOC Transition Note (Signed)
Transition of Care [facility] - CM/SW Discharge Note   Patient Details  Name: [name] MRN: [id number] Date of Birth: [birth year]  Transition of [facility]) CM/SW Contact:  Leeroy Cha, RN Phone Number: 03/12/2022, 1:09 PM   Clinical Narrative:    Patient to home with o2.   Final next level of care: [place] Barriers to Discharge: Barriers Resolved   Patient Goals and CMS Choice Patient states their goals for this hospitalization and ongoing recovery are:: home      Discharge Placement                       Discharge Plan and Services   Discharge Planning Services: CM Consult            DME Arranged: Oxygen DME Agency: [employer] Date DME Agency Contacted: 03/12/22 Time DME Agency Contacted: 70 Representative spoke with at DME Agency: [place]: PT   Date [place]: 03/06/22 Time [place]: 1030 Representative spoke with at [place]: Tommi Rumps  Social Determinants of Health (Day) Interventions     Readmission Risk Interventions   No data to display

## 2022-03-12 NOTE — Discharge Summary (Signed)
Physician Discharge Summary   [name]:[id number] DOB: 15-Apr-1950 DOA: 02/25/2022  PCP: Kelly Held, DO  Admit date: 02/25/2022 Discharge date: 03/12/2022  Barriers to discharge: none  Admitted From: Home Disposition:  Home Discharging physician: Kelly Dee, MD  Recommendations for Outpatient Follow-up:  Scalp lesion noted, will need outpt derm referral New adrenal lesion noted; follow up further discussion with oncology Follow-up with pulmonology  Home Health:  Equipment/Devices:   Discharge Condition: stable CODE STATUS: DNR Diet recommendation:  Diet Orders (From admission, onward)     Start     Ordered   03/12/22 0000  Diet Carb Modified        03/12/22 1059   03/02/22 0741  Diet Carb Modified Fluid consistency: Thin; Room service appropriate? Yes  Diet effective now       Question Answer Comment  Diet-HS Snack? Nothing   Calorie Level Medium 1600-2000   Fluid consistency: Thin   Room service appropriate? Yes      03/02/22 0740            Hospital Course: Ms. 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  She was found to be febrile and hypoxic on workup. COVID, flu, and RVP were negative on admission. CTA chest on admission was notable for PE involving right upper lung distal segmental and subsegmental vessels.  There was also concern for underlying airspace disease concerning for multifocal pneumonia. Pulmonology was consulted. Oxygen was slowly weaned throughout hospitalization. She was treated with anticoagulation which was transitioned to Lovenox per oncology recommendations.  Steroid taper also continued at discharge.  Antibiotics were completed during hospitalization.  Assessment and Plan: * Acute respiratory failure with hypoxia ([place]) - etiology presumed due to underlying PNA,  radiation pneumonitis and/or some contribution from PE (though lower suspicion) - weaning off O2 prior to discharge -O2 89% with ambulating (borderline) -Still having hypoxia with ambulation, home O2 arranged prior to discharge   Pulmonary embolism ([place]) - CTA chest on admission: Positive examination for pulmonary emboli demonstrated focally in right upper lung distal segmental and subsegmental vessels. Developing patchy airspace and interstitial infiltrates in the lungs most likely representing multifocal pneumonia. Prominent mediastinal and abdominal lymph nodes, likely metastatic. Multiple bone metastases again demonstrated. - echo obtained 11/25: EF 60-65%, Gr 1 DD. 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 Followed by oncology. Had 4 sessions of XRT in October 2023. Last session on 01-08-2022. Last received IV chemo July 2023. -Oncology following.  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  Anemia -Patient with no overt bleeding. -Anemia likely secondary to acute illness and radiation treatment. -Follow H&H. -Transfusion threshold hemoglobin < 7  Hypophosphatemia -Repleted   Hypomagnesemia -Repleted  Hypokalemia -Repleted  Constipation -Continue current bowel regimen  Hyponatremia-resolved as of 03/11/2022 - TSH 2.597.  -Free  T4  1.28.   -Urine sodium 142, urine potassium 27,  -Concern hyponatremia may be secondary to small cell lung cancer.  - Na now stable   The patient's chronic medical conditions were treated accordingly per the patient's home medication regimen except as noted.  On day of discharge, patient was felt deemed stable for discharge. Patient/family member advised to call PCP or come back to ER if needed.   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[place]) 06/06/2021    Priority: Low   Hypothyroidism 06/04/2010    Priority: Low   Type 2 diabetes mellitus without complication, with long-term current use of insulin ([place]) 06/04/2010    Priority: Low   Scalp lesion 03/11/2022   Adrenal mass ([place]) 03/11/2022   Constipation 02/26/2022   Hypokalemia 02/26/2022   Hypomagnesemia 02/26/2022   Hypophosphatemia 02/26/2022   Anemia 02/26/2022    Resolved Hospital Problems   Diagnosis Date Noted Date Resolved   Hyponatremia 02/26/2022 03/11/2022     Discharge Instructions     Diet Carb Modified   Complete by: As directed    Increase activity slowly   Complete by: As directed       Allergies as of 03/12/2022       Reactions   Lyrica [pregabalin] Other (See Comments)   Hallucinations    Crestor [rosuvastatin Calcium] Other (See Comments)   Leg cramps        Medication List     STOP taking these medications    chlorhexidine 0.12 % solution Commonly known as: PERIDEX   sucralfate 1 g tablet Commonly known as: Carafate       TAKE these medications    acetaminophen 325 MG tablet Commonly known as: TYLENOL Take 1 tablet (325 mg total) by mouth every 6 (six) hours as needed for  moderate pain. What changed:  medication strength how much to take   albuterol 108 (90 Base) MCG/ACT inhaler Commonly known as: VENTOLIN HFA Inhale 2 puffs into the lungs every 6 (six) hours as needed for wheezing or shortness of breath.   ALPRAZolam 0.5 MG tablet Commonly known as: XANAX TAKE ONE TABLET BY MOUTH THREE TIMES A DAY AS NEEDED What changed: reasons to take this   blood glucose meter kit and supplies Kit Use daily   enoxaparin 80 MG/0.8ML injection Commonly known as: LOVENOX Inject 0.7 mLs (70 mg total) into the skin daily. Start taking on: March 13, 2022   FLUoxetine 20 MG capsule Commonly known as: PROZAC Take 1 capsule (20 mg total) by mouth daily.   Insulin Pen Needle 32G X 5 MM Misc Commonly known as: NovoTwist Use daily at Bedtime with Lantus   Lantus SoloStar 100 UNIT/ML Solostar Pen Generic drug: insulin glargine Inject 30 Units into the skin daily. HS   levothyroxine 125 MCG tablet Commonly known as: Synthroid Take 1 tablet (125 mcg total) by mouth daily before breakfast.   meloxicam 15 MG tablet Commonly known as: MOBIC Take 1 tablet (15 mg total) by mouth daily.   metFORMIN 500 MG 24 hr tablet Commonly known as: GLUCOPHAGE-XR Take 1 tablet (500 mg total) by mouth in the morning  and at bedtime.   omeprazole 20 MG capsule Commonly known as: PRILOSEC Take 1 capsule (20 mg total) by mouth daily. What changed:  when to take this reasons to take this   ONE TOUCH ULTRA 2 w/Device Kit Check blood sugar as directed. Dx:E11.65   OneTouch Verio Flex System w/Device Kit USE AS DIRECTED TO CHECK BLOOD SUGAR FOUR TIMES A DAY   OneTouch Ultra test strip Generic drug: glucose blood USE TO CHECK BLOOD SUGAR FOUR TIMES A DAY AS DIRECTED   oxyCODONE-acetaminophen 7.5-325 MG tablet Commonly known as: Percocet Take 1 tablet by mouth every 6 (six) hours as needed for severe pain.   predniSONE 10 MG tablet Commonly known as: DELTASONE Take 3  tablets daily for 4 days, then 2 tablets daily for 4 days, then 1 tablet daily for 4 days, then 1/2 (half) tablet daily for 4 days Start taking on: March 13, 2022   sulfamethoxazole-trimethoprim 800-160 MG tablet Commonly known as: BACTRIM DS Take 1 tablet by mouth 3 (three) times a week. Start taking on: March 13, 9936   Trulicity 1.69 CV/8.9FY Sopn Generic drug: Dulaglutide Inject 0.75 mg into the skin every Saturday.   UNABLE TO FIND Please provide hair prothesis for patient undergoing chemotherapy for cancer diagnosis.   valACYclovir 1000 MG tablet Commonly known as: VALTREX TAKE ONE TABLET BY MOUTH DAILY AS NEEDED What changed:  how much to take reasons to take this   Xtampza ER 13.5 MG C12a Generic drug: oxyCODONE ER Take 13.5 mg by mouth 2 (two) times daily. What changed:  when to take this reasons to take this               Durable Medical Equipment  (From admission, onward)           Start     Ordered   03/12/22 0959  For home use only DME oxygen  Once       Question Answer Comment  Length of Need 6 Months   Mode or (Route) Nasal cannula   Liters per Minute 3   Frequency Continuous (stationary and portable oxygen unit needed)   Oxygen conserving device Yes   Oxygen delivery system Gas      03/12/22 0958            Follow-up Information     [facility] Follow up.   Specialty: Home Health Services Contact information: [place] [address] [phone]                Allergies  Allergen Reactions   Lyrica [Pregabalin] Other (See Comments)    Hallucinations    Crestor [Rosuvastatin Calcium] Other (See Comments)    Leg cramps    Consultations: Pulmonology Oncology  Procedures:   Discharge Exam: BP 129/71 (BP Location: Right Arm)   Pulse (!) 102   Temp 98.4 F (36.9 C) (Oral)   Resp 18   Ht 4' 11.5" (1.511 m)   Wt 48.3 kg   SpO2 98%   BMI 21.15 kg/m  Physical  Exam Constitutional:      General: She is not in acute distress.    Appearance: Normal appearance.  HENT:     Head: Normocephalic and atraumatic.     Mouth/Throat:     Mouth: Mucous membranes are moist.  Eyes:     Extraocular Movements: Extraocular movements intact.  Cardiovascular:     Rate and Rhythm: Normal rate and regular rhythm.  Pulmonary:  Effort: Pulmonary effort is normal. No respiratory distress.     Breath sounds: No wheezing.  Abdominal:     General: Bowel sounds are normal. There is no distension.     Palpations: Abdomen is soft.     Tenderness: There is no abdominal tenderness.  Musculoskeletal:        General: Normal range of motion.     Cervical back: Normal range of motion and neck supple.  Skin:    General: Skin is warm.  Neurological:     General: No focal deficit present.     Mental Status: She is alert.  Psychiatric:        Mood and Affect: Mood normal.      The results of significant diagnostics from this hospitalization (including imaging, microbiology, ancillary and laboratory) are listed below for reference.   Microbiology: No results found for this or any previous visit (from the past 240 hour(s)).   Labs: BNP (last 3 results) Recent Labs    02/28/22 0814 03/01/22 0500  BNP 101.9* 53.2   Basic Metabolic Panel: Recent Labs  Lab 03/07/22 0555 03/08/22 0350 03/09/22 1252 03/10/22 0845 03/11/22 0502  NA 140 140 137 133* 140  K 3.6 3.7 4.1 3.2* 4.5  CL 97* 98 95* 97* 103  CO2 32 32 _0 GLUCOSE 80 64* 209* 218* 87  BUN 38* 40* 36* 27* 25*  CREATININE 0.82 0.78 0.70 0.67 0.59  CALCIUM 9.5 9.3 8.9 8.7* 8.7*  MG 2.3 2.4 2.3 2.2 2.3  PHOS 5.1* 4.3 3.5 3.3 2.6   Liver Function Tests: Recent Labs  Lab 03/07/22 0555 03/08/22 0350 03/09/22 1252 03/10/22 0845 03/11/22 0502  AST 41 33 _1 ALT 88* 7 61* 52* 45*  ALKPHOS 71 69 77 68 62  BILITOT 0.5 0.6 0.6 0.5 0.4  PROT 8.0 7.6 8.0 7.4 7.0  ALBUMIN 3.4* 3.1* 3.2* 3.0*  2.9*   No results for input(s): "LIPASE", "AMYLASE" in the last 168 hours. No results for input(s): "AMMONIA" in the last 168 hours. CBC: Recent Labs  Lab 03/07/22 0555 03/08/22 0350 03/09/22 1252 03/10/22 0845 03/11/22 0502  WBC 15.6* 14.4* 14.8* 13.4* 11.0*  NEUTROABS 12.7* 12.1* 13.6* 10.9* 8.6*  HGB 12.3 11.6* 11.6* 11.1* 11.0*  HCT 38.9 37.0 37.1 36.1 35.6*  MCV 93.5 93.7 95.4 96.5 96.2  PLT 420* 349 314 277 274   Cardiac Enzymes: No results for input(s): "CKTOTAL", "CKMB", "CKMBINDEX", "TROPONINI" in the last 168 hours. BNP: Invalid input(s): "POCBNP" CBG: Recent Labs  Lab 03/11/22 1945 03/12/22 0734 03/12/22 0752 03/12/22 0838 03/12/22 1151  GLUCAP 331* 46* 50* 225* 346*   D-Dimer No results for input(s): "DDIMER" in the last 72 hours. Hgb A1c No results for input(s): "HGBA1C" in the last 72 hours. Lipid Profile No results for input(s): "CHOL", "HDL", "LDLCALC", "TRIG", "CHOLHDL", "LDLDIRECT" in the last 72 hours. Thyroid function studies No results for input(s): "TSH", "T4TOTAL", "T3FREE", "THYROIDAB" in the last 72 hours.  Invalid input(s): "FREET3" Anemia work up No results for input(s): "VITAMINB12", "FOLATE", "FERRITIN", "TIBC", "IRON", "RETICCTPCT" in the last 72 hours. Urinalysis    Component Value Date/Time   COLORURINE YELLOW 02/25/2022 [place] 02/25/2022 1638   LABSPEC <=1.005 02/25/2022 1638   PHURINE 5.5 02/25/2022 1638   GLUCOSEU >=500 (A) 02/25/2022 1638   HGBUR NEGATIVE 02/25/2022 1638   HGBUR negative 06/04/2010 0817   BILIRUBINUR NEGATIVE 02/25/2022 1638   BILIRUBINUR negative 11/18/2020 1601   KETONESUR 15 (  A) 02/25/2022 1638   PROTEINUR NEGATIVE 02/25/2022 1638   UROBILINOGEN 0.2 11/18/2020 1601   UROBILINOGEN negative 06/04/2010 0817   NITRITE NEGATIVE 02/25/2022 1638   LEUKOCYTESUR NEGATIVE 02/25/2022 1638   Sepsis Labs Recent Labs  Lab 03/08/22 0350 03/09/22 1252 03/10/22 0845 03/11/22 0502  WBC  14.4* 14.8* 13.4* 11.0*   Microbiology No results found for this or any previous visit (from the past 240 hour(s)).  Procedures/Studies: DG CHEST PORT 1 VIEW  Result Date: 03/11/2022 CLINICAL DATA:  Shortness of [id number] SOB (shortness of breath) [id number] EXAM: PORTABLE CHEST 1 VIEW COMPARISON:  03/10/2022 FINDINGS: Port in the anterior chest wall with tip in distal SVC. Normal cardiac silhouette. LEFT lingular pneumonia unchanged. Increased airspace density in the RIGHT lower lobe. Upper lungs are clear. No pneumothorax. Anterior and posterior cervical fusion. IMPRESSION: 1. Dense LEFT lingular pneumonia. 2. Increasing subtle RIGHT lower lobe pneumonia. Electronically Signed   By: Suzy Bouchard M.D.   On: 03/11/2022 08:06   DG CHEST PORT 1 VIEW  Result Date: 03/10/2022 CLINICAL DATA:  71 year old female presenting with shortness of breath. EXAM: PORTABLE CHEST 1 VIEW COMPARISON:  March 08, 2022 FINDINGS: RIGHT IJ Port-A-Cath terminates at the upper RIGHT atrium similar to prior imaging. Heart size stable and accentuated by portable technique and AP projection. Obscured LEFT heart border on frontal view with interstitial and alveolar opacities throughout the LEFT lower chest. Patchy opacities also at the RIGHT lung base. No pneumothorax. On limited assessment no acute skeletal process. IMPRESSION: 1. Findings suspicious for lingular pneumonia, potentially with early involvement of the RIGHT lung base. 2. RIGHT IJ Port-A-Cath terminates at the upper RIGHT atrium. Electronically Signed   By: Zetta Bills M.D.   On: 03/10/2022 08:13   MR LIVER W WO CONTRAST  Result Date: 03/08/2022 CLINICAL DATA:  Elevated liver function tests. Possible hepatic mass on recent ultrasound. Small cell lung carcinoma. EXAM: MRI ABDOMEN WITHOUT AND WITH CONTRAST TECHNIQUE: Multiplanar multisequence MR imaging of the abdomen was performed both before and after the administration of intravenous contrast. CONTRAST:   66m GADAVIST GADOBUTROL 1 MMOL/ML IV SOLN COMPARISON:  Ultrasound on 03/06/2022 FINDINGS: Lower chest: No acute findings. Hepatobiliary: Diffuse T2 hypointensity of the hepatic parenchyma is consistent with iron overload. A homogeneous mass is seen in the liver dome which shows marked T2 hyperintensity, and measures 2.3 x 2.2 cm on image 7/4. This shows mild arterial phase hyperenhancement which persists on subsequent phases, and shows no evidence of washout or delayed peripheral rim enhancement. This does not have typical features of liver metastasis, differential diagnosis also includes benign etiology such as atypical hemangioma and adenoma, with focal nodular hyperplasia considered less likely. No other hepatic masses are identified. Prior cholecystectomy. Mild dilatation of common bile duct is seen measuring 12 mm. No evidence of choledocholithiasis or intrahepatic biliary ductal dilatation. Pancreas: No mass or inflammatory changes. No evidence of pancreatic ductal dilatation. Spleen: Within normal limits in size. Diffuse T2 hypointensity of the splenic parenchyma is consistent with iron overload. Adrenals/Urinary Tract: A 1.3 x 1.2 cm homogeneous mass is seen involving the right adrenal gland, which measures 1.3 x 1.2 cm on image 18/4. This mass has nonspecific characteristics, but is new compared to prior PET-CT on 11/03/2021. This raises suspicion for a adrenal metastasis. A small benign-appearing right renal cyst is noted. No suspicious renal masses identified. No evidence of hydronephrosis. Stomach/Bowel: Small hiatal hernia is seen.  Otherwise unremarkable. Vascular/Lymphatic: No pathologically enlarged lymph nodes identified. No acute vascular findings.  Other:  None. Musculoskeletal: Diffuse T2 hypointensity of the bone marrow is consistent with iron overload. Several bone lesions in the thoracic spine are stable and consistent with bone metastases. IMPRESSION: Hemosiderosis. New 1.3 cm left adrenal  mass, suspicious for adrenal metastasis. Consider further evaluation with PET-CT or tissue sampling. Solitary 2.3 cm mass in the liver dome. This does not have a typical appearance for metastasis, and differential diagnosis also includes benign etiologies such as atypical hemangioma, adenoma, less likely focal nodular hyperplasia. Consider further evaluation with PET-CT or tissue sampling. Prior cholecystectomy. Mild dilatation of common bile duct, without evidence of choledocholithiasis or other obstructing etiology. Stable appearance of bone metastases in the thoracic spine. Hemosiderosis. Electronically Signed   By: Marlaine Hind M.D.   On: 03/08/2022 16:04   DG CHEST PORT 1 VIEW  Result Date: 03/08/2022 CLINICAL DATA:  Shortness of breath and dyspnea. EXAM: PORTABLE CHEST 1 VIEW COMPARISON:  March 07, 2022 FINDINGS: The heart size and mediastinal contours are stable. The heart size is mildly enlarged. Right central venous line is identified unchanged. Patchy consolidation of left lung base is noted. Minimal opacity of the medial right lung base is improved. The visualized skeletal structures are stable. IMPRESSION: Left lung base pneumonia. Medial right lung base opacity is improved compared to prior exam. Electronically Signed   By: Abelardo Diesel M.D.   On: 03/08/2022 09:06   DG CHEST PORT 1 VIEW  Result Date: 03/07/2022 CLINICAL DATA:  141880 SOB (shortness of breath) 141880 EXAM: PORTABLE CHEST - 1 VIEW COMPARISON:  03/06/2022 FINDINGS: The mediastinal contours are within normal limits. No cardiomegaly. Indwelling right IJ Port-A-Cath in unchanged position with the catheter tip near the cavoatrial junction. Atherosclerotic calcification of the aortic arch similar appearing left greater than right hazy interstitial opacities, most prominent in the lingula. No acute osseous abnormality. Partially visualized cervical fusion hardware, unchanged. Surgical clips in the right upper quadrant, unchanged.  IMPRESSION: 1. Similar appearing bibasilar, left greater than right hazy opacities, again favoring multifocal pneumonia in the appropriate clinical setting. 2.  Aortic Atherosclerosis (ICD10-I70.0). Electronically Signed   By: Ruthann Cancer M.D.   On: 03/07/2022 08:07   US Abdomen Limited RUQ (LIVER/GB)  Result Date: 03/06/2022 CLINICAL DATA:  Abnormal LFTs. EXAM: ULTRASOUND ABDOMEN LIMITED RIGHT UPPER QUADRANT COMPARISON:  None Available. FINDINGS: Gallbladder: Surgically absent Common bile duct: Diameter: 3 mm on limited imaging Liver: A masslike hypoechoic region is identified in the right hepatic lobe measuring 2.9 x 3.2 x 2.0 cm. Portal vein is patent on color Doppler imaging with normal direction of blood flow towards the liver. Other: None. IMPRESSION: 1. A masslike hypoechoic region is identified in the right hepatic lobe measuring up to 3.2 cm. A mass is not excluded. Recommend an MRI of the abdomen with and without contrast for better evaluation of this location. 2. The gallbladder and visualized common bile duct are unremarkable. Electronically Signed   By: Dorise Bullion III M.D.   On: 03/06/2022 18:58   DG CHEST PORT 1 VIEW  Result Date: 03/06/2022 CLINICAL DATA:  14:18 a.m. 0 with shortness of breath. EXAM: PORTABLE CHEST 1 VIEW COMPARISON:  Portable chest yesterday at 5:06 a.m. FINDINGS: 4:16 a.m. Right IJ port catheter again terminates in the right atrium. Multiple overlying monitor wires. Interstitial and patchy hazy opacities of the left-greater-than-right lower lung fields continue to be noted without significant improvement or worsening. The right mid, both upper lung fields remain clear. No pleural effusion is seen. There is mild  cardiomegaly but no vascular congestion findings. Stable mediastinum with aortic atherosclerosis. Fusion hardware lower C-spine. IMPRESSION: 1. Interstitial and patchy hazy opacities of the left-greater-than-right lower lung fields continue to be noted without  significant improvement or worsening. Probable multilobar pneumonia. 2. Aortic atherosclerosis. Electronically Signed   By: Telford Nab M.D.   On: 03/06/2022 07:35   DG CHEST PORT 1 VIEW  Result Date: 03/05/2022 CLINICAL DATA:  Shortness of breath. EXAM: PORTABLE CHEST 1 VIEW COMPARISON:  Radiographs 03/04/2022 and 03/02/2022.  CT 02/25/2022. FINDINGS: 0506 hours. There are slightly lower lung volumes. Right IJ Port-A-Cath tip is unchanged at the level of the mid right atrium. The heart size and mediastinal contours are stable. There are increased left-greater-than-right basilar opacities. No evidence of pneumothorax or significant pleural effusion. The bones appear unchanged. Postsurgical changes are present in the cervicothoracic spine. Telemetry leads overlie the chest. IMPRESSION: Lower lung volumes with increased left-greater-than-right basilar opacities, likely atelectasis superimposed on underlying pneumonia. No other significant changes. Electronically Signed   By: Richardean Sale M.D.   On: 03/05/2022 08:12   DG Chest 1 View  Result Date: 03/04/2022 CLINICAL DATA:  Pulmonary edema, lung cancer EXAM: CHEST  1 VIEW COMPARISON:  03/02/2022. FINDINGS: Alveolar density mid to lower left lung consistent with pneumonia. Vascular congestion. No pneumothorax. No definite pleural effusion. Calcified aorta. Right-sided Port-A-Cath tip overlies distal SVC. IMPRESSION: Vascular congestion. Alveolar density left mid to lower lung, likely pneumonia. Follow-up recommended to confirm resolution and exclude other etiologies. Electronically Signed   By: Sammie Bench M.D.   On: 03/04/2022 08:23   DG CHEST PORT 1 VIEW  Result Date: 03/02/2022 CLINICAL DATA:  Acute respiratory failure with hypoxia. EXAM: PORTABLE CHEST 1 VIEW COMPARISON:  AP chest 03/01/2022, chest two views 02/28/2022, AP chest 02/25/2022, chest two views 07/25/2021;, PET-CT 11/03/2021 FINDINGS: Right chest wall porta catheter tip again  overlies the superior aspect of the right atrium. Cardiac silhouette is again at the upper limits of normal size. Mediastinal contours are within normal limits. Left mid and lower lung and right lower lung interstitial thickening is similar to multiple prior radiographs from 03/01/2022 through 02/25/2022. Mildly increased hazy opacification of the left mid and lower lung and right lower lung compared to 07/25/2020. No definite pleural effusion. No pneumothorax. Minimal dextrocurvature of the midthoracic spine. IMPRESSION: Mildly increased hazy opacification of the left mid and lower lung and right lower lung compared to 07/25/2020 but unchanged from more recent November 2023 radiographs over the course of this past week. Has this patient undergone radiation therapy for left hilar lymphadenopathy seen on 11/03/2021 PET-CT? If so, some of the lung findings may represent postradiation change. Otherwise, differential considerations include chronic interstitial thickening with superimposed pulmonary edema versus multifocal infection within the left-greater-than-right lungs. Electronically Signed   By: Yvonne Kendall M.D.   On: 03/02/2022 08:34   DG CHEST PORT 1 VIEW  Result Date: 03/01/2022 CLINICAL DATA:  Hypoxemic respiratory failure. EXAM: PORTABLE CHEST 1 VIEW COMPARISON:  02/28/2022 FINDINGS: The right IJ power port is stable. The cardiac silhouette, mediastinal and hilar contours are within normal limits and unchanged. Persistent but slightly improved perihilar airspace process, likely pulmonary edema. No pleural effusions. IMPRESSION: Persistent but slightly improved perihilar airspace process, likely pulmonary edema. Electronically Signed   By: Marijo Sanes M.D.   On: 03/01/2022 09:13   ECHOCARDIOGRAM COMPLETE  Result Date: 02/28/2022    ECHOCARDIOGRAM REPORT   Patient Name:   MAIAH SINNING Theissen Date of Exam: 02/28/2022 Medical  Rec #:  [id number]     Height:       59.5 in Accession #:    [id number]    Weight:        116.8 lb Date of Birth:  1950/08/16     BSA:          1.476 m Patient Age:    27 years      BP:           124/76 mmHg Patient Gender: F             HR:           95 bpm. Exam Location:  Inpatient Procedure: 2D Echo Indications:    pulmonary edema  History:        Patient has no prior history of Echocardiogram examinations.                 Risk Factors:Current Smoker, Diabetes and Dyslipidemia.  Sonographer:    Harvie Junior Referring Phys: [id number] CHI JANE ELLISON  Sonographer Comments: Technically difficult study due to poor echo windows and echo performed with patient supine and on artificial respirator. Image acquisition challenging due to respiratory motion. IMPRESSIONS  1. Left ventricular ejection fraction, by estimation, is 60 to 65%. The left ventricle has normal function. Left ventricular endocardial border not optimally defined to evaluate regional wall motion. Left ventricular diastolic parameters are consistent with Grade I diastolic dysfunction (impaired relaxation).  2. Right ventricular systolic function is normal. The right ventricular size is normal. There is normal pulmonary artery systolic pressure. The estimated right ventricular systolic pressure is 78.5 mmHg.  3. A small pericardial effusion is present. The pericardial effusion is anterior to the right ventricle.  4. The mitral valve is grossly normal. Mild mitral valve regurgitation.  5. The aortic valve is tricuspid. Aortic valve regurgitation is not visualized. Aortic valve sclerosis is present, with no evidence of aortic valve stenosis. Aortic valve mean gradient measures 3.0 mmHg.  6. The inferior vena cava is normal in size with greater than 50% respiratory variability, suggesting right atrial pressure of 3 mmHg. Comparison(s): No prior Echocardiogram. FINDINGS  Left Ventricle: Left ventricular ejection fraction, by estimation, is 60 to 65%. The left ventricle has normal function. Left ventricular endocardial border not optimally  defined to evaluate regional wall motion. The left ventricular internal cavity size was normal in size. There is no left ventricular hypertrophy. Left ventricular diastolic parameters are consistent with Grade I diastolic dysfunction (impaired relaxation). Right Ventricle: The right ventricular size is normal. No increase in right ventricular wall thickness. Right ventricular systolic function is normal. There is normal pulmonary artery systolic pressure. The tricuspid regurgitant velocity is 2.48 m/s, and  with an assumed right atrial pressure of 3 mmHg, the estimated right ventricular systolic pressure is 88.5 mmHg. Left Atrium: Left atrial size was normal in size. Right Atrium: Right atrial size was normal in size. Pericardium: A small pericardial effusion is present. The pericardial effusion is anterior to the right ventricle. Mitral Valve: The mitral valve is grossly normal. Mild mitral valve regurgitation. Tricuspid Valve: The tricuspid valve is grossly normal. Tricuspid valve regurgitation is trivial. Aortic Valve: The aortic valve is tricuspid. Aortic valve regurgitation is not visualized. Aortic valve sclerosis is present, with no evidence of aortic valve stenosis. Aortic valve mean gradient measures 3.0 mmHg. Aortic valve peak gradient measures 5.4  mmHg. Aortic valve area, by VTI measures 2.11 cm. Pulmonic Valve: The pulmonic valve was not well visualized. Pulmonic valve  regurgitation is not visualized. Aorta: The aortic root is normal in size and structure. Venous: The inferior vena cava is normal in size with greater than 50% respiratory variability, suggesting right atrial pressure of 3 mmHg. IAS/Shunts: No atrial level shunt detected by color flow Doppler.  LEFT VENTRICLE PLAX 2D LVIDd:         3.80 cm     Diastology LVIDs:         3.00 cm     LV e' medial:    4.68 cm/s LV PW:         0.90 cm     LV E/e' medial:  18.1 LV IVS:        1.00 cm     LV e' lateral:   4.68 cm/s LVOT diam:     1.80 cm     LV  E/e' lateral: 18.1 LV SV:         43 LV SV Index:   29 LVOT Area:     2.54 cm  LV Volumes (MOD) LV vol d, MOD A2C: 36.6 ml LV vol d, MOD A4C: 58.7 ml LV vol s, MOD A2C: 20.3 ml LV vol s, MOD A4C: 28.4 ml LV SV MOD A2C:     16.3 ml LV SV MOD A4C:     58.7 ml LV SV MOD BP:      24.7 ml RIGHT VENTRICLE RV Basal diam:  3.00 cm RV Mid diam:    2.30 cm TAPSE (M-mode): 1.3 cm LEFT ATRIUM             Index        RIGHT ATRIUM          Index LA diam:        3.00 cm 2.03 cm/m   RA Area:     7.13 cm LA Vol (A2C):   19.6 ml 13.28 ml/m  RA Volume:   11.20 ml 7.59 ml/m LA Vol (A4C):   14.1 ml 9.55 ml/m LA Biplane Vol: 17.9 ml 12.13 ml/m  AORTIC VALVE                    PULMONIC VALVE AV Area (Vmax):    1.99 cm     PV Vmax:       0.99 m/s AV Area (Vmean):   1.95 cm     PV Peak grad:  3.9 mmHg AV Area (VTI):     2.11 cm AV Vmax:           116.00 cm/s AV Vmean:          80.200 cm/s AV VTI:            0.205 m AV Peak Grad:      5.4 mmHg AV Mean Grad:      3.0 mmHg LVOT Vmax:         90.70 cm/s LVOT Vmean:        61.400 cm/s LVOT VTI:          0.170 m LVOT/AV VTI ratio: 0.83  AORTA Ao Root diam: 2.80 cm MITRAL VALVE                TRICUSPID VALVE MV Area (PHT): 4.49 cm     TR Peak grad:   24.6 mmHg MV Decel Time: 169 msec     TR Vmax:        248.00 cm/s MR Peak grad: 103.1 mmHg MR Vmax:      507.80 cm/s  SHUNTS MV E velocity: 84.80 cm/s   Systemic VTI:  0.17 m MV A velocity: 115.00 cm/s  Systemic Diam: 1.80 cm MV E/A ratio:  0.74 Rozann Lesches MD Electronically signed by Rozann Lesches MD Signature Date/Time: 02/28/2022/1:48:44 PM    Final    DG Chest 2 View  Result Date: 02/28/2022 CLINICAL DATA:  Shortness of breath. EXAM: CHEST - 2 VIEW COMPARISON:  February 25, 2022. FINDINGS: Stable cardiomediastinal silhouette. Right internal jugular Port-A-Cath is unchanged in position. Mildly increased bilateral diffuse reticular opacities are noted concerning for worsening edema or atypical infection. Bony thorax is  unremarkable. IMPRESSION: Increased bilateral lung opacities as described above. Electronically Signed   By: Marijo Conception M.D.   On: 02/28/2022 08:57   VAS [place] LOWER EXTREMITY VENOUS (DVT)  Result Date: 02/28/2022  Lower Venous DVT Study Patient Name:  CHRISHAWNA FARINA  Date of Exam:   02/26/2022 Medical Rec #: [id number]      Accession #:    [id number] Date of Birth: 1950-07-03      Patient Gender: F Patient Age:   71 years Exam Location:  [facility] Procedure:      VAS [place] LOWER EXTREMITY VENOUS (DVT) Referring Phys: Irine Seal --------------------------------------------------------------------------------  Indications: Pulmonary embolism.  Limitations: Poor ultrasound/tissue interface. Comparison Study: No prior studies. Performing Technologist: Oliver Hum RVT  Examination Guidelines: A complete evaluation includes B-mode imaging, spectral Doppler, color Doppler, and power Doppler as needed of all accessible portions of each vessel. Bilateral testing is considered an integral part of a complete examination. Limited examinations for reoccurring indications may be performed as noted. The reflux portion of the exam is performed with the patient in reverse Trendelenburg.  +---------+---------------+---------+-----------+----------+--------------+ RIGHT    CompressibilityPhasicitySpontaneityPropertiesThrombus Aging +---------+---------------+---------+-----------+----------+--------------+ CFV      Full           Yes      Yes                                 +---------+---------------+---------+-----------+----------+--------------+ SFJ      Full                                                        +---------+---------------+---------+-----------+----------+--------------+ FV Prox  Full                                                        +---------+---------------+---------+-----------+----------+--------------+ FV Mid   Full                                                         +---------+---------------+---------+-----------+----------+--------------+ FV DistalFull                                                        +---------+---------------+---------+-----------+----------+--------------+  PFV      Full                                                        +---------+---------------+---------+-----------+----------+--------------+ POP      Full           Yes      Yes                                 +---------+---------------+---------+-----------+----------+--------------+ PTV      Full                                                        +---------+---------------+---------+-----------+----------+--------------+ PERO     Full                                                        +---------+---------------+---------+-----------+----------+--------------+   +---------+---------------+---------+-----------+----------+--------------+ LEFT     CompressibilityPhasicitySpontaneityPropertiesThrombus Aging +---------+---------------+---------+-----------+----------+--------------+ CFV      Full           Yes      Yes                                 +---------+---------------+---------+-----------+----------+--------------+ SFJ      Full                                                        +---------+---------------+---------+-----------+----------+--------------+ FV Prox  Full                                                        +---------+---------------+---------+-----------+----------+--------------+ FV Mid   Full                                                        +---------+---------------+---------+-----------+----------+--------------+ FV DistalFull                                                        +---------+---------------+---------+-----------+----------+--------------+ PFV      Full                                                         +---------+---------------+---------+-----------+----------+--------------+  POP      Full           Yes      Yes                                 +---------+---------------+---------+-----------+----------+--------------+ PTV      Full                                                        +---------+---------------+---------+-----------+----------+--------------+ PERO     Full                                                        +---------+---------------+---------+-----------+----------+--------------+     Summary: RIGHT: - There is no evidence of deep vein thrombosis in the lower extremity.  - No cystic structure found in the popliteal fossa.  LEFT: - There is no evidence of deep vein thrombosis in the lower extremity.  - No cystic structure found in the popliteal fossa.  *See table(s) above for measurements and observations. Electronically signed by Harold Barban MD on 02/28/2022 at 12:15:18 AM.    Final    CT Angio Chest PE W/Cm &/Or Wo Cm  Result Date: 02/25/2022 CLINICAL DATA:  Pulmonary embolus suspected with high probability. EXAM: CT ANGIOGRAPHY CHEST WITH CONTRAST TECHNIQUE: Multidetector CT imaging of the chest was performed using the standard protocol during bolus administration of intravenous contrast. Multiplanar CT image reconstructions and MIPs were obtained to evaluate the vascular anatomy. RADIATION DOSE REDUCTION: This exam was performed according to the departmental dose-optimization program which includes automated exposure control, adjustment of the mA and/or kV according to patient size and/or use of iterative reconstruction technique. CONTRAST:  68m OMNIPAQUE IOHEXOL 350 MG/ML SOLN COMPARISON:  PET-CT 11/03/2021.  CT chest 05/14/2021 FINDINGS: Cardiovascular: Good opacification of the central and segmental pulmonary arteries with technically adequate study. Intraluminal filling defects are demonstrated in segmental and subsegmental right upper lobe pulmonary  arteries. This is consistent with acute nonocclusive pulmonary embolus. No additional emboli are demonstrated. RA to RV ratio is normal suggesting no evidence of heart strain. Normal heart size. No pericardial effusions. Normal caliber thoracic aorta. No aortic dissection. Great vessel origins are patent. Mediastinum/Nodes: Thyroid gland is unremarkable. Central venous catheter with tip in the cavoatrial junction. Esophagus is decompressed. Small esophageal hiatal hernia. Lymphadenopathy in the mediastinum with pretracheal lymph nodes measuring up to 1.3 cm in diameter. Lymphadenopathy is similar to previous PET-CT and decreased since prior CT. Lungs/Pleura: Emphysematous changes in the lungs. Patchy interstitial and ground-glass infiltrates demonstrated in both lungs. Changes are new since previous studies, likely indicating multifocal pneumonia. Edema would be less likely to have this distribution. There is peribronchial thickening with mild bronchiectasis. No pleural effusions. No pneumothorax. Upper Abdomen: Surgical absence of the gallbladder. Suggestion of prominent periaortic lymph nodes but incompletely included within the field of view. Nodes measure up to 1.1 cm diameter. Abdominal lymphadenopathy may be progressing since previous study. Musculoskeletal: Degenerative changes in the spine. Postoperative changes in the cervical spine. Multiple mixed sclerotic and lucent lesions throughout the thoracic vertebrae consistent with metastatic disease. Similar  appearance to previous studies. Review of the MIP images confirms the above findings. IMPRESSION: 1. Positive examination for pulmonary emboli demonstrated focally in right upper lung distal segmental and subsegmental vessels. 2. Developing patchy airspace and interstitial infiltrates in the lungs most likely representing multifocal pneumonia. 3. Prominent mediastinal and abdominal lymph nodes, likely metastatic. 4. Multiple bone metastases again  demonstrated. Critical Value/emergent results were called by telephone at the time of interpretation on 02/25/2022 at 7:45 pm to provider Fredia Sorrow , who verbally acknowledged these results. Electronically Signed   By: Lucienne Capers M.D.   On: 02/25/2022 19:53   DG Chest Port 1 View  Result Date: 02/25/2022 CLINICAL DATA:  Shortness of breath EXAM: PORTABLE CHEST 1 VIEW COMPARISON:  Chest x-ray July 25, 2020. FINDINGS: Chronic streaky bibasilar opacities. No confluent consolidation. No visible pleural effusions or pneumothorax. Right IJ approach Port-A-Cath with the tip projecting at the right atrium. Cardiomediastinal silhouette is unchanged. Partially imaged cervical fusion hardware. IMPRESSION: Chronic streaky bibasilar opacities, which may represent atelectasis/scar given unchanged appearance. Superimposed atypical infection is difficult to exclude. No confluent consolidation. Electronically Signed   By: Margaretha Sheffield M.D.   On: 02/25/2022 16:17     Time coordinating discharge: Over 30 minutes    Kelly Dee, MD  [facility] 03/12/2022, 4:36 PM

## 2022-03-12 NOTE — TOC Progression Note (Signed)
Transition of Care [facility]) - Progression Note    Patient Details  Name: [name] MRN: [id number] Date of Birth: 08-13-1950  [facility]) CM/SW Contact  Leeroy Cha, RN Phone Number: 03/12/2022, 11:31 AM  Clinical Narrative:    Home and travel o2 ordered through [employer] at 1130.   Expected Discharge Plan: [place] Barriers to Discharge: Continued Medical Work up  Expected Discharge Plan and Services Expected Discharge Plan: [place]   Discharge Planning Services: CM Consult   Living arrangements for the past 2 months: Single Family Home Expected Discharge Date: 03/12/22                         HH Arranged: PT   Date [place]: 03/06/22 Time [place]: 1030 Representative spoke with at [place]: [place] ([place]) Interventions    Readmission Risk Interventions   No data to display

## 2022-03-12 NOTE — Progress Notes (Signed)
SATURATION QUALIFICATIONS: (This note is used to comply with regulatory documentation for home oxygen)  Patient Saturations on Room Air at Rest = 92%  Patient Saturations on Room Air while Ambulating = 86%  Patient Saturations on 2 Liters of oxygen while Ambulating = 90%  Please briefly explain why patient needs home oxygen:

## 2022-03-13 ENCOUNTER — Telehealth: Payer: Self-pay

## 2022-03-13 ENCOUNTER — Other Ambulatory Visit: Payer: Self-pay | Admitting: Internal Medicine

## 2022-03-13 DIAGNOSIS — I2699 Other pulmonary embolism without acute cor pulmonale: Secondary | ICD-10-CM

## 2022-03-13 DIAGNOSIS — C3490 Malignant neoplasm of unspecified part of unspecified bronchus or lung: Secondary | ICD-10-CM

## 2022-03-13 DIAGNOSIS — M6281 Muscle weakness (generalized): Secondary | ICD-10-CM

## 2022-03-13 NOTE — Patient Outreach (Signed)
  Care Coordination   Collaboration  Visit Note   03/13/2022 Name: AMIAYAH GIEBEL MRN: [id number] DOB: [birth year]  Christa See Mcaffee is a 71 y.o. year old female who sees Carollee Herter, Alferd Apa, DO for primary care. I  collaborated with PCP and Dr. Sabino Gasser and obtained orders for nebulizer and home health.   What matters to the patients health and wellness today?  Getting nebulizer and HH    Goals Addressed               This Visit's Progress     COMPLETED: "I was supposed to get a nebulizer and Home Health has not called me" (pt-stated)        Care Coordination Interventions: Reviewed medications with patient and discussed patient stating she was suppose to have an order for a nebulizer. Collaborated with Dr. Etter Sjogren regarding nebulizer and Dr. Sabino Gasser regarding obtaining orders for home health. Assessed social determinant of health barriers         SDOH assessments and interventions completed:  Yes  SDOH Interventions Today    Flowsheet Row Most Recent Value  SDOH Interventions   Housing Interventions Intervention Not Indicated  Transportation Interventions Intervention Not Indicated        Care Coordination Interventions:  Yes, provided   Follow up plan: No further intervention required.   Encounter Outcome:  Pt. Visit Completed

## 2022-03-13 NOTE — Patient Outreach (Signed)
  Care Coordination TOC Note Transition Care Management Follow-up Telephone Call Date of discharge and from where: [facility] 02/26/22-03/12/22 How have you been since you were released from the hospital? "I am feeling well today" Any questions or concerns? Yes- Is home health going to call and come see me? I also was suppose to get a nebulizer and one is not ordered.  This Probation officer reached out to PCP to inquire about the nebulizer and contacted Velva Harman, RN, TOC to see if she can assist with getting the [facility] ordered.  Items Reviewed: Did the pt receive and understand the discharge instructions provided? Yes  Medications obtained and verified? Yes -patient notes the nebulizer she was told she needs was not ordered. She has albuterol inhaler Other? No  Any new allergies since your discharge? No  Dietary orders reviewed? Yes Do you have support at home? Yes   Home Care and Equipment/Supplies: Were home health services ordered? yes If so, what is the name of the agency? [facility]  Has the agency set up a time to come to the patient's home? no Were any new equipment or medical supplies ordered?  Yes: oxygen What is the name of the medical supply agency? [employer] Were you able to get the supplies/equipment? yes Do you have any questions related to the use of the equipment or supplies? No  Functional Questionnaire: (I = Independent and D = Dependent) ADLs: Needs assistance  Bathing/Dressing- needs assistance  Meal Prep- Needs assistance  Eating- I  Maintaining continence- I  Transferring/Ambulation- supervision  Managing Meds- I  Follow up appointments reviewed:  PCP Hospital f/u appt confirmed? No   Specialist Hospital f/u appt confirmed? Yes  Scheduled to see Claudia Pollock, MD on 03/26/22 @ 4:00. Are transportation arrangements needed? No  If their condition worsens, is the pt aware to call PCP or go to the Emergency Dept.? Yes Was the patient provided with contact information for  the PCP's office or ED? Yes Was to pt encouraged to call back with questions or concerns? Yes  SDOH assessments and interventions completed:   Yes   Care Coordination Interventions:  PCP follow up appointment requested   Encounter Outcome:  Pt. Visit Completed

## 2022-03-16 ENCOUNTER — Telehealth: Payer: Self-pay | Admitting: Family Medicine

## 2022-03-16 DIAGNOSIS — Z7901 Long term (current) use of anticoagulants: Secondary | ICD-10-CM | POA: Diagnosis not present

## 2022-03-16 DIAGNOSIS — Z7985 Long-term (current) use of injectable non-insulin antidiabetic drugs: Secondary | ICD-10-CM | POA: Diagnosis not present

## 2022-03-16 DIAGNOSIS — Z9181 History of falling: Secondary | ICD-10-CM | POA: Diagnosis not present

## 2022-03-16 DIAGNOSIS — C7989 Secondary malignant neoplasm of other specified sites: Secondary | ICD-10-CM | POA: Diagnosis not present

## 2022-03-16 DIAGNOSIS — Z794 Long term (current) use of insulin: Secondary | ICD-10-CM | POA: Diagnosis not present

## 2022-03-16 DIAGNOSIS — Z791 Long term (current) use of non-steroidal anti-inflammatories (NSAID): Secondary | ICD-10-CM | POA: Diagnosis not present

## 2022-03-16 DIAGNOSIS — Z981 Arthrodesis status: Secondary | ICD-10-CM | POA: Diagnosis not present

## 2022-03-16 DIAGNOSIS — Z7984 Long term (current) use of oral hypoglycemic drugs: Secondary | ICD-10-CM | POA: Diagnosis not present

## 2022-03-16 DIAGNOSIS — I3139 Other pericardial effusion (noninflammatory): Secondary | ICD-10-CM | POA: Diagnosis not present

## 2022-03-16 DIAGNOSIS — D630 Anemia in neoplastic disease: Secondary | ICD-10-CM | POA: Diagnosis not present

## 2022-03-16 DIAGNOSIS — E119 Type 2 diabetes mellitus without complications: Secondary | ICD-10-CM | POA: Diagnosis not present

## 2022-03-16 DIAGNOSIS — J9601 Acute respiratory failure with hypoxia: Secondary | ICD-10-CM | POA: Diagnosis not present

## 2022-03-16 DIAGNOSIS — C7951 Secondary malignant neoplasm of bone: Secondary | ICD-10-CM | POA: Diagnosis not present

## 2022-03-16 DIAGNOSIS — Z792 Long term (current) use of antibiotics: Secondary | ICD-10-CM | POA: Diagnosis not present

## 2022-03-16 DIAGNOSIS — C787 Secondary malignant neoplasm of liver and intrahepatic bile duct: Secondary | ICD-10-CM | POA: Diagnosis not present

## 2022-03-16 DIAGNOSIS — I2693 Single subsegmental pulmonary embolism without acute cor pulmonale: Secondary | ICD-10-CM | POA: Diagnosis not present

## 2022-03-16 DIAGNOSIS — I700 Atherosclerosis of aorta: Secondary | ICD-10-CM | POA: Diagnosis not present

## 2022-03-16 DIAGNOSIS — I2699 Other pulmonary embolism without acute cor pulmonale: Secondary | ICD-10-CM | POA: Diagnosis not present

## 2022-03-16 DIAGNOSIS — I083 Combined rheumatic disorders of mitral, aortic and tricuspid valves: Secondary | ICD-10-CM | POA: Diagnosis not present

## 2022-03-16 DIAGNOSIS — Z7952 Long term (current) use of systemic steroids: Secondary | ICD-10-CM | POA: Diagnosis not present

## 2022-03-16 DIAGNOSIS — C3492 Malignant neoplasm of unspecified part of left bronchus or lung: Secondary | ICD-10-CM | POA: Diagnosis not present

## 2022-03-16 DIAGNOSIS — E039 Hypothyroidism, unspecified: Secondary | ICD-10-CM | POA: Diagnosis not present

## 2022-03-16 DIAGNOSIS — Z9981 Dependence on supplemental oxygen: Secondary | ICD-10-CM | POA: Diagnosis not present

## 2022-03-16 DIAGNOSIS — J181 Lobar pneumonia, unspecified organism: Secondary | ICD-10-CM | POA: Diagnosis not present

## 2022-03-16 NOTE — Telephone Encounter (Signed)
Caller/Agency: [facility]) [place] Number: [phone] Requesting OT/PT/Skilled Nursing/Social Work/Speech Therapy: Nursing for Pneumonia and Pulmonary Embolism Frequency: 2 w 1, 1 w 2  Pt also wanted to ask about nebulizer and medication that was mentioned in appt?  Kenney Houseman stated she would set pt Heart Rate Parameter at 115 due to HR being at 110.

## 2022-03-17 DIAGNOSIS — J9601 Acute respiratory failure with hypoxia: Secondary | ICD-10-CM | POA: Diagnosis not present

## 2022-03-17 DIAGNOSIS — G4733 Obstructive sleep apnea (adult) (pediatric): Secondary | ICD-10-CM | POA: Diagnosis not present

## 2022-03-18 ENCOUNTER — Encounter: Payer: Self-pay | Admitting: *Deleted

## 2022-03-18 ENCOUNTER — Telehealth: Payer: Self-pay | Admitting: Emergency Medicine

## 2022-03-18 ENCOUNTER — Telehealth: Payer: Self-pay | Admitting: Family Medicine

## 2022-03-18 NOTE — Telephone Encounter (Signed)
Called and left message for Tonya from [place] to call office back

## 2022-03-18 NOTE — Telephone Encounter (Signed)
Verbal given 

## 2022-03-18 NOTE — Telephone Encounter (Signed)
Caller/Agency: [facility] (christine)  Callback Number: [phone]  Requesting OT/PT/Skilled Nursing/Social Work/Speech Therapy: PT Frequency: 1x for 4 weeks

## 2022-03-18 NOTE — Telephone Encounter (Signed)
Called. LVM. Verbal given. Advised patient hasn't been seen in office since Feb.

## 2022-03-18 NOTE — Progress Notes (Signed)
Patient needs to have her PET rescheduled. Per Dr Marin Olp he would also like patient to be seen in the office next week. Message sent to scheduling.   PET scheduled for 04/02/2022. Patient is aware of PET appointment including date, time, and location. The following prep is reviewed with patient and confirmed with teachback: - arrive 30 minutes before appointment time - NPO except water for 6h before scan. No candy, no gum - hold any diabetic medication the morning of the scan. Long acting insulin needs to be held 8h and short acting 2h. - have a low carb dinner the night prior Radiology Information sheet also mailed to patient's home for reinforcement of education.   Oncology Nurse Navigator Documentation     03/18/2022   12:15 PM  Oncology Nurse Navigator Flowsheets  Navigator Follow Up Date: 03/24/2022  Navigator Follow Up Reason: Follow-up Appointment  Navigator Location [facility]  Navigator Encounter Type Telephone;Appt/Treatment Plan Review  Telephone Appt Confirmation/Clarification;Education;Outgoing Call  Patient Visit Type MedOnc  Treatment Phase Active Tx  Barriers/Navigation Needs Coordination of Care;Education  Education Other  Interventions Coordination of Care;Education;Psycho-Social Support  Acuity Level 2-Minimal Needs (1-2 Barriers Identified)  Coordination of Care Appts;Radiology  Education Method Verbal;Written  Support Groups/Services Friends and Family  Time Spent with Patient 30

## 2022-03-20 ENCOUNTER — Encounter: Payer: Self-pay | Admitting: Family Medicine

## 2022-03-20 ENCOUNTER — Ambulatory Visit (INDEPENDENT_AMBULATORY_CARE_PROVIDER_SITE_OTHER): Payer: PPO | Admitting: Family Medicine

## 2022-03-20 VITALS — BP 100/62 | HR 118 | Temp 98.4°F | Resp 18 | Ht 59.0 in | Wt 109.0 lb

## 2022-03-20 DIAGNOSIS — J449 Chronic obstructive pulmonary disease, unspecified: Secondary | ICD-10-CM | POA: Diagnosis not present

## 2022-03-20 DIAGNOSIS — L989 Disorder of the skin and subcutaneous tissue, unspecified: Secondary | ICD-10-CM

## 2022-03-20 DIAGNOSIS — I2699 Other pulmonary embolism without acute cor pulmonale: Secondary | ICD-10-CM

## 2022-03-20 DIAGNOSIS — C3490 Malignant neoplasm of unspecified part of unspecified bronchus or lung: Secondary | ICD-10-CM | POA: Diagnosis not present

## 2022-03-20 DIAGNOSIS — Z794 Long term (current) use of insulin: Secondary | ICD-10-CM

## 2022-03-20 DIAGNOSIS — E119 Type 2 diabetes mellitus without complications: Secondary | ICD-10-CM

## 2022-03-20 DIAGNOSIS — C787 Secondary malignant neoplasm of liver and intrahepatic bile duct: Secondary | ICD-10-CM

## 2022-03-20 NOTE — Assessment & Plan Note (Signed)
Pt will f/u derm

## 2022-03-20 NOTE — Assessment & Plan Note (Signed)
On lovenox

## 2022-03-20 NOTE — Progress Notes (Signed)
Established Patient Office Visit  Subjective   Patient ID: [name], female    DOB: [birth year]  Age: 71 y.o. MRN: [id number]  Chief Complaint  Patient presents with   Hospitalization Follow-up    Small PE and hypoxia    HPI Pt is here with her husband for hosp f/u -- she was admitted 11/22-12/7 for pneumonia / hypoxia-- she already has f/u in oncology, pulmonary  Patient Active Problem List   Diagnosis Date Noted   Scalp lesion 03/11/2022   Adrenal mass ([place]) 03/11/2022   Pulmonary embolism ([place]) 02/26/2022   Constipation 02/26/2022   Hypokalemia 02/26/2022   Hypomagnesemia 02/26/2022   Hypophosphatemia 02/26/2022   Anemia 02/26/2022   Acute respiratory failure with hypoxia ([place]) 02/25/2022   Gastroenteritis 02/20/2022   History of tobacco use 06/25/2021   Small cell lung cancer, left upper lobe ([place]) 06/06/2021   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[place]) 06/06/2021   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[place]) 06/06/2021   Lung mass 05/28/2021   Goals of care, counseling/discussion 05/23/2021   Abnormal MRI, spine 05/08/2021   Type 2 diabetes mellitus with diabetic peripheral angiopathy without gangrene, without long-term current use of insulin ([place]) 02/21/2021   Anxiety 02/28/2019   Hx of colonic polyps ssp and adenoma 03/06/2018   Sinus headache 05/25/2017   Headache above the eye region 05/25/2017   Obstructive sleep apnea treated with continuous positive airway pressure (CPAP) 01/28/2017   Urinary frequency 11/23/2016   Diabetes mellitus, type II, insulin dependent ([place]) 11/23/2016   Chronic pansinusitis 10/12/2016   Preventative health care 02/07/2016   Cigarette nicotine dependence with nicotine-induced disorder 07/22/2015   Hypersomnia with sleep apnea 07/22/2015   Snoring 07/22/2015   S/P UPPP (uvulopalatopharyngoplasty) 07/22/2015   Severe frontal headaches 07/22/2015   Chronic ethmoidal sinusitis  07/22/2015   OSA (obstructive sleep apnea) 07/08/2015   Cervical osteoarthritis 11/23/2014   Degeneration of intervertebral disc of cervical region 11/23/2014   Cervical pain 11/23/2014   OE (otitis externa) 12/09/2012   Hypothyroidism 06/04/2010   Type 2 diabetes mellitus without complication, with long-term current use of insulin ([place]) 06/04/2010   Hyperlipidemia LDL goal <70 06/04/2010   OSTEOARTHRITIS 06/04/2010   Past Medical History:  Diagnosis Date   Arthritis    Cancer ([place])    Diabetes mellitus    Esophagitis    Goals of care, counseling/discussion 05/23/2021   Hiatal hernia    History of radiation therapy    Left Lung- 11/27/21-01/08/22- Dr. 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[place]) 06/06/2021   Small cell lung cancer, left upper lobe ([place]) 06/06/2021   Thyroid disease    TMJ (sprain of temporomandibular joint)    Past Surgical History:  Procedure Laterality Date   ABDOMINAL HYSTERECTOMY     BACK SURGERY     x3   BACK SURGERY     x 5 , 2 neck surgeries, 2 lower back surgeries   BRONCHIAL BIOPSY  06/02/2021   Procedure: BRONCHIAL BIOPSIES;  Surgeon: Collene Gobble, MD;  Location: [facility];  Service: Pulmonary;;   BRONCHIAL BRUSHINGS  06/02/2021   Procedure: BRONCHIAL BRUSHINGS;  Surgeon: Lamonte Sakai,  Rose Fillers, MD;  Location: [facility] ENDOSCOPY;  Service: Pulmonary;;   BRONCHIAL NEEDLE ASPIRATION BIOPSY  06/02/2021   Procedure: BRONCHIAL NEEDLE ASPIRATION BIOPSIES;  Surgeon: Collene Gobble, MD;  Location: [facility] ENDOSCOPY;  Service: Pulmonary;;   CHOLECYSTECTOMY     COLONOSCOPY     ERCP     ESOPHAGOGASTRODUODENOSCOPY     IR IMAGING GUIDED PORT INSERTION  06/13/2021   THYROID SURGERY     TONSILECTOMY, ADENOIDECTOMY, BILATERAL MYRINGOTOMY AND  TUBES     VIDEO BRONCHOSCOPY WITH ENDOBRONCHIAL ULTRASOUND Left 06/02/2021   Procedure: VIDEO BRONCHOSCOPY WITH ENDOBRONCHIAL ULTRASOUND;  Surgeon: Collene Gobble, MD;  Location: [facility] ENDOSCOPY;  Service: Pulmonary;  Laterality: Left;   Social History   Tobacco Use   Smoking status: Former    Years: 48.00    Types: Cigarettes    Quit date: 02/01/2017    Years since quitting: 5.1   Smokeless tobacco: Never  Vaping Use   Vaping Use: Never used  Substance Use Topics   Alcohol use: Never   Drug use: No   Social History   Socioeconomic History   Marital status: Married    Spouse name: Not on file   Number of children: Not on file   Years of education: Not on file   Highest education level: Not on file  Occupational History   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    Employer: [employer]  Tobacco Use   Smoking status: Former    Years: 48.00    Types: Cigarettes    Quit date: 02/01/2017    Years since quitting: 5.1   Smokeless tobacco: Never  Vaping Use   Vaping Use: Never used  Substance and Sexual Activity   Alcohol use: Never   Drug use: No   Sexual activity: Not on file  Other Topics Concern   Not on file  Social History Narrative   Exercise--  3 flts steps a day for 8 hours   Social Determinants of Health   Financial Resource Strain: Low Risk  (06/29/2019)   Overall Financial Resource Strain (CARDIA)    Difficulty of Paying Living Expenses: Not hard at all  Food Insecurity: No Food Insecurity (02/26/2022)   Hunger Vital Sign    Worried About Running Out of Food in the Last Year: Never true    Ran Out of Food in the Last Year: Never true  Transportation Needs: No Transportation Needs (03/13/2022)   PRAPARE - Hydrologist (Medical): No    Lack of Transportation (Non-Medical): No  Physical Activity: Not on file  Stress: Not on file  Social Connections: Not on file  Intimate Partner Violence: Not At Risk (02/26/2022)   Humiliation,  Afraid, Rape, and Kick questionnaire    Fear of Current or Ex-Partner: No    Emotionally Abused: No    Physically Abused: No    Sexually Abused: No   Family Status  Relation Name Status   Mother  Deceased   Father  Deceased   Brother  (Not Specified)   MGM  Deceased   MGF  Deceased   PGM  Deceased   PGF  Deceased   Daughter  Alive, age 51y   Cousin  (Not Specified)   Neg Hx  (Not Specified)   Family History  Problem Relation Age of Onset   Stroke Mother    Alcohol abuse Mother    Cancer Brother        leukemia   Stroke Maternal Grandmother  Heart disease Maternal Grandfather    Cancer Daughter        breast cancer   Breast cancer Daughter    Breast cancer Cousin    Colon cancer Neg Hx    Esophageal cancer Neg Hx    Rectal cancer Neg Hx    Stomach cancer Neg Hx    Allergies  Allergen Reactions   Lyrica [Pregabalin] Other (See Comments)    Hallucinations    Crestor [Rosuvastatin Calcium] Other (See Comments)    Leg cramps    Review of Systems  Constitutional:  Negative for chills, fever and malaise/fatigue.  HENT:  Negative for congestion and hearing loss.   Eyes:  Negative for discharge.  Respiratory:  Negative for cough, sputum production and shortness of breath.   Cardiovascular:  Negative for chest pain, palpitations and leg swelling.  Gastrointestinal:  Negative for abdominal pain, blood in stool, constipation, diarrhea, heartburn, nausea and vomiting.  Genitourinary:  Negative for dysuria, frequency, hematuria and urgency.  Musculoskeletal:  Negative for back pain, falls and myalgias.  Skin:  Negative for rash.  Neurological:  Negative for dizziness, sensory change, loss of consciousness, weakness and headaches.  Endo/Heme/Allergies:  Negative for environmental allergies. Does not bruise/bleed easily.  Psychiatric/Behavioral:  Negative for depression and suicidal ideas. The patient is not nervous/anxious and does not have insomnia.       Objective:      BP 100/62 (BP Location: Left Arm, Patient Position: Sitting, Cuff Size: Normal)   Pulse (!) 118   Temp 98.4 F (36.9 C) (Oral)   Resp 18   Ht 4\' 11"  (1.499 m)   Wt 109 lb (49.4 kg)   SpO2 98%   BMI 22.02 kg/m  BP Readings from Last 3 Encounters:  03/20/22 100/62  03/12/22 129/71  02/25/22 111/72   Wt Readings from Last 3 Encounters:  03/20/22 109 lb (49.4 kg)  03/12/22 106 lb 7.7 oz (48.3 kg)  02/09/22 119 lb 3.2 oz (54.1 kg)   SpO2 Readings from Last 3 Encounters:  03/20/22 98%  03/12/22 98%  02/25/22 93%      Physical Exam Vitals and nursing note reviewed.  Constitutional:      Appearance: She is well-developed.  HENT:     Head: Normocephalic and atraumatic.  Eyes:     Conjunctiva/sclera: Conjunctivae normal.  Neck:     Thyroid: No thyromegaly.     Vascular: No carotid bruit or JVD.  Cardiovascular:     Rate and Rhythm: Normal rate and regular rhythm.     Heart sounds: Normal heart sounds. No murmur heard. Pulmonary:     Effort: Pulmonary effort is normal. No respiratory distress.     Breath sounds: Normal breath sounds. No wheezing or rales.     Comments: On O2 via [place] Chest:     Chest wall: No tenderness.  Musculoskeletal:     Cervical back: Normal range of motion and neck supple.  Skin:    Findings: Lesion present.     Comments: Red  papule on scalp with some scab   Neurological:     General: No focal deficit present.     Mental Status: She is alert and oriented to person, place, and time.  Psychiatric:        Mood and Affect: Mood normal.        Behavior: Behavior normal.        Thought Content: Thought content normal.        Judgment: Judgment normal.  No results found for any visits on 03/20/22.  Last CBC Lab Results  Component Value Date   WBC 11.0 (H) 03/11/2022   HGB 11.0 (L) 03/11/2022   HCT 35.6 (L) 03/11/2022   MCV 96.2 03/11/2022   MCH 29.7 03/11/2022   RDW 13.9 03/11/2022   PLT 274 56/43/3295   Last metabolic  panel Lab Results  Component Value Date   GLUCOSE 87 03/11/2022   NA 140 03/11/2022   K 4.5 03/11/2022   CL 103 03/11/2022   CO2 31 03/11/2022   BUN 25 (H) 03/11/2022   CREATININE 0.59 03/11/2022   GFRNONAA >60 03/11/2022   CALCIUM 8.7 (L) 03/11/2022   PHOS 2.6 03/11/2022   PROT 7.0 03/11/2022   ALBUMIN 2.9 (L) 03/11/2022   BILITOT 0.4 03/11/2022   ALKPHOS 62 03/11/2022   AST 23 03/11/2022   ALT 45 (H) 03/11/2022   ANIONGAP 6 03/11/2022   Last lipids Lab Results  Component Value Date   CHOL 182 02/21/2021   HDL 46.10 02/21/2021   LDLCALC 102 (H) 02/21/2021   LDLDIRECT 162.0 03/28/2015   TRIG 169.0 (H) 02/21/2021   CHOLHDL 4 02/21/2021   Last hemoglobin A1c Lab Results  Component Value Date   HGBA1C 10.4 (H) 02/26/2022   Last thyroid functions Lab Results  Component Value Date   TSH 2.597 02/26/2022   T4TOTAL 11.6 11/14/2021   Last vitamin D No results found for: "25OHVITD2", "25OHVITD3", "VD25OH" Last vitamin B12 and Folate Lab Results  Component Value Date   VITAMINB12 445 03/05/2022   FOLATE 6.2 03/05/2022      The 10-year ASCVD risk score (Arnett DK, et al., 2019) is: 16.3%    Assessment & Plan:   Problem List Items Addressed This Visit       Unprioritized   Type 2 diabetes mellitus without complication, with long-term current use of insulin (HCC)    , minimize simple carbs. Increase exercise as tolerated. 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    [name], DO

## 2022-03-20 NOTE — Assessment & Plan Note (Signed)
,   minimize simple carbs. Increase exercise as tolerated. Continue current meds Lab Results  Component Value Date   HGBA1C 10.4 (H) 02/26/2022

## 2022-03-20 NOTE — Assessment & Plan Note (Signed)
Per onc  

## 2022-03-20 NOTE — Patient Instructions (Signed)
Community-Acquired Pneumonia, Adult Pneumonia is a lung infection that causes inflammation and the buildup of mucus and fluids in the lungs. This may cause coughing and difficulty breathing. Community-acquired pneumonia is pneumonia that develops in people who are not, and have not recently been, in a hospital or other health care facility. Usually, pneumonia develops as a result of an illness that is caused by a virus, such as the common cold and the flu (influenza). It can also be caused by bacteria or fungi. While the common cold and influenza can pass from person to person (are contagious), pneumonia itself is not considered contagious. What are the causes? This condition may be caused by: Viruses. Bacteria. Fungi. What increases the risk? The following factors may make you more likely to develop this condition: Being over age 65 or having certain medical conditions, such as: A long-term (chronic) disease, such as: chronic obstructive pulmonary disease (COPD), asthma, heart failure, diabetes, or kidney disease. A condition that increases the risk of breathing in (aspirating) mucus and other fluids from your mouth and nose. A weakened body defense system (immune system). Having had your spleen removed (splenectomy). The spleen is the organ that helps fight germs and infections. Not cleaning your teeth and gums well (poor dental hygiene). Using tobacco products. Traveling to places where germs that cause pneumonia are present or being near certain animals or animal habitats that could have germs that cause pneumonia. What are the signs or symptoms? Symptoms of this condition include: A dry cough or a wet (productive) cough. A fever, sweating, or chills. Chest pain, especially when breathing deeply or coughing. Fast breathing, difficulty breathing, or shortness of breath. Tiredness (fatigue) and muscle aches. How is this diagnosed? This condition may be diagnosed based on your medical  history or a physical exam. You may also have tests, including: Imaging, such as a chest X-ray or lung ultrasound. Tests of: The level of oxygen and other gases in your blood. Mucus from your lungs (sputum). Fluid around your lungs (pleural fluid). Your urine. How is this treated? Treatment for this condition depends on many factors, such as the cause of your pneumonia, your medicines, and other medical conditions that you have. For most adults, pneumonia may be treated at home. In some cases, treatment must happen in a hospital and may include: Medicines that are given by mouth (orally) or through an IV, including: Antibiotic medicines, if bacteria caused the pneumonia. Medicines that kill viruses (antiviral medicines), if a virus caused the pneumonia. Oxygen therapy. Severe pneumonia, although rare, may require the following treatments: Mechanical ventilation.This procedure uses a machine to help you breathe if you cannot breathe well on your own or maintain a safe level of blood oxygen. Thoracentesis. This procedure removes any buildup of pleural fluid to help with breathing. Follow these instructions at home:  Medicines Take over-the-counter and prescription medicines only as told by your health care provider. Take cough medicine only if you have trouble sleeping. Cough medicine can prevent your body from removing mucus from your lungs. If you were prescribed antibiotics, take them as told by your health care provider. Do not stop taking the antibiotic even if you start to feel better. Lifestyle     Do not drink alcohol. Do not use any products that contain nicotine or tobacco. These products include cigarettes, chewing tobacco, and vaping devices, such as e-cigarettes. If you need help quitting, ask your health care provider. Eat a healthy diet. This includes plenty of vegetables, fruits, whole grains, low-fat   dairy products, and lean protein. General instructions Rest a lot and  get at least 8 hours of sleep each night. Sleep in a partly upright position at night. Place a few pillows under your head or sleep in a reclining chair. Return to your normal activities as told by your health care provider. Ask your health care provider what activities are safe for you. Drink enough fluid to keep your urine pale yellow. This helps to thin the mucus in your lungs. If your throat is sore, gargle with a mixture of salt and water 3-4 times a day or as needed. To make salt water, completely dissolve -1 tsp (3-6 g) of salt in 1 cup (237 mL) of warm water. Keep all follow-up visits. How is this prevented? You can lower your risk of developing community-acquired pneumonia by: Getting the pneumonia vaccine. There are different types and schedules of pneumonia vaccines. Ask your health care provider which option is best for you. Consider getting the pneumonia vaccine if: You are older than 71 years of age. You are 19-65 years of age and are receiving cancer treatment, have chronic lung disease, or have other medical conditions that affect your immune system. Ask your health care provider if this applies to you. Getting your influenza vaccine every year. Ask your health care provider which type of vaccine is best for you. Getting regular dental checkups. Washing your hands often with soap and water for at least 20 seconds. If soap and water are not available, use hand sanitizer. Contact a health care provider if: You have a fever. You have trouble sleeping because you cannot control your cough with cough medicine. Get help right away if: Your shortness of breath becomes worse. Your chest pain increases. Your sickness becomes worse, especially if you are an older adult or have a weak immune system. You cough up blood. These symptoms may be an emergency. Get help right away. Call 911. Do not wait to see if the symptoms will go away. Do not drive yourself to the  hospital. Summary Pneumonia is an infection of the lungs. Community-acquired pneumonia develops in people who have not been in the hospital. It can be caused by bacteria, viruses, or fungi. This condition may be treated with antibiotics or antiviral medicines. Severe pneumonia may require a hospital stay and treatment to help with breathing. This information is not intended to replace advice given to you by your health care provider. Make sure you discuss any questions you have with your health care provider. Document Revised: 05/21/2021 Document Reviewed: 05/21/2021 Elsevier Patient Education  2023 [employer]  

## 2022-03-20 NOTE — Assessment & Plan Note (Signed)
On O2 

## 2022-03-20 NOTE — Assessment & Plan Note (Signed)
Per oncology °

## 2022-03-24 ENCOUNTER — Inpatient Hospital Stay: Payer: PPO

## 2022-03-24 ENCOUNTER — Other Ambulatory Visit: Payer: Self-pay

## 2022-03-24 ENCOUNTER — Inpatient Hospital Stay (HOSPITAL_BASED_OUTPATIENT_CLINIC_OR_DEPARTMENT_OTHER): Payer: PPO | Admitting: Hematology & Oncology

## 2022-03-24 ENCOUNTER — Encounter: Payer: Self-pay | Admitting: Hematology & Oncology

## 2022-03-24 ENCOUNTER — Inpatient Hospital Stay: Payer: PPO | Attending: Hematology & Oncology

## 2022-03-24 ENCOUNTER — Encounter: Payer: Self-pay | Admitting: *Deleted

## 2022-03-24 VITALS — BP 112/74 | HR 101 | Temp 97.6°F | Resp 18 | Ht 59.0 in | Wt 108.0 lb

## 2022-03-24 DIAGNOSIS — C3490 Malignant neoplasm of unspecified part of unspecified bronchus or lung: Secondary | ICD-10-CM | POA: Insufficient documentation

## 2022-03-24 DIAGNOSIS — Z923 Personal history of irradiation: Secondary | ICD-10-CM | POA: Insufficient documentation

## 2022-03-24 DIAGNOSIS — Z7901 Long term (current) use of anticoagulants: Secondary | ICD-10-CM | POA: Diagnosis not present

## 2022-03-24 DIAGNOSIS — R5383 Other fatigue: Secondary | ICD-10-CM | POA: Diagnosis not present

## 2022-03-24 DIAGNOSIS — E032 Hypothyroidism due to medicaments and other exogenous substances: Secondary | ICD-10-CM

## 2022-03-24 DIAGNOSIS — C3412 Malignant neoplasm of upper lobe, left bronchus or lung: Secondary | ICD-10-CM

## 2022-03-24 DIAGNOSIS — R002 Palpitations: Secondary | ICD-10-CM | POA: Diagnosis not present

## 2022-03-24 DIAGNOSIS — R059 Cough, unspecified: Secondary | ICD-10-CM | POA: Insufficient documentation

## 2022-03-24 DIAGNOSIS — I2693 Single subsegmental pulmonary embolism without acute cor pulmonale: Secondary | ICD-10-CM | POA: Insufficient documentation

## 2022-03-24 DIAGNOSIS — R0602 Shortness of breath: Secondary | ICD-10-CM | POA: Diagnosis not present

## 2022-03-24 LAB — CBC WITH DIFFERENTIAL (CANCER CENTER ONLY)
Abs Immature Granulocytes: 0.04 10*3/uL (ref 0.00–0.07)
Basophils Absolute: 0 10*3/uL (ref 0.0–0.1)
Basophils Relative: 0 %
Eosinophils Absolute: 0.3 10*3/uL (ref 0.0–0.5)
Eosinophils Relative: 3 %
HCT: 36.1 % (ref 36.0–46.0)
Hemoglobin: 11.5 g/dL — ABNORMAL LOW (ref 12.0–15.0)
Immature Granulocytes: 1 %
Lymphocytes Relative: 9 %
Lymphs Abs: 0.7 10*3/uL (ref 0.7–4.0)
MCH: 30 pg (ref 26.0–34.0)
MCHC: 31.9 g/dL (ref 30.0–36.0)
MCV: 94.3 fL (ref 80.0–100.0)
Monocytes Absolute: 0.6 10*3/uL (ref 0.1–1.0)
Monocytes Relative: 7 %
Neutro Abs: 6.3 10*3/uL (ref 1.7–7.7)
Neutrophils Relative %: 80 %
Platelet Count: 180 10*3/uL (ref 150–400)
RBC: 3.83 MIL/uL — ABNORMAL LOW (ref 3.87–5.11)
RDW: 14 % (ref 11.5–15.5)
WBC Count: 7.9 10*3/uL (ref 4.0–10.5)
nRBC: 0 % (ref 0.0–0.2)

## 2022-03-24 LAB — TSH: TSH: 0.923 u[IU]/mL (ref 0.350–4.500)

## 2022-03-24 LAB — CMP (CANCER CENTER ONLY)
ALT: 22 U/L (ref 0–44)
AST: 14 U/L — ABNORMAL LOW (ref 15–41)
Albumin: 3.6 g/dL (ref 3.5–5.0)
Alkaline Phosphatase: 53 U/L (ref 38–126)
Anion gap: 8 (ref 5–15)
BUN: 12 mg/dL (ref 8–23)
CO2: 27 mmol/L (ref 22–32)
Calcium: 9.3 mg/dL (ref 8.9–10.3)
Chloride: 99 mmol/L (ref 98–111)
Creatinine: 0.67 mg/dL (ref 0.44–1.00)
GFR, Estimated: >60 mL/min (ref >60)
Glucose, Bld: 296 mg/dL — ABNORMAL HIGH (ref 70–99)
Potassium: 4.1 mmol/L (ref 3.5–5.1)
Sodium: 134 mmol/L — ABNORMAL LOW (ref 135–145)
Total Bilirubin: 0.3 mg/dL (ref 0.3–1.2)
Total Protein: 6.7 g/dL (ref 6.5–8.1)

## 2022-03-24 LAB — LACTATE DEHYDROGENASE: LDH: 219 U/L — ABNORMAL HIGH (ref 98–192)

## 2022-03-24 MED ORDER — OXYCODONE-ACETAMINOPHEN 7.5-325 MG PO TABS: 1.0000 | ORAL_TABLET | Freq: Four times a day (QID) | ORAL | 0 refills | Status: DC | PRN

## 2022-03-24 NOTE — Telephone Encounter (Signed)
Last refill 02/20/22 #90

## 2022-03-24 NOTE — Progress Notes (Signed)
Hematology and Oncology Follow Up Visit  MARYROSE COLVIN [id number] 1951-02-01 71 y.o. 03/24/2022   Principle Diagnosis:  Small cell lung cancer -- Extensive stage Pulmonary embolism -right upper lobe segmental/subsegmental   Current Therapy:        Carbo/VP-16/Tecentriq -- s/p cycle 6 -- start on 06/11/2021 Xgeva 120 mg subcu every 3 months -- next dose on 03/2022 Radiation therapy to the chest-completed 01/06/2022 Lovenox 80 mg SQ daily --started on 03/10/2022   Interim History:  Ms. Belzer is here today for follow-up.  She just got out of the hospital.  She was hospitalized for about 2 weeks.  She came in because of pulmonary issues.  She had a pulmonary embolism.  She currently is on Lovenox for this.  She takes Lovenox daily.   She had what I thought was to be radiation pneumonitis.  She was on steroids.  She currently is weaned off steroids.  She is still wearing oxygen.  She uses oxygen 24 hours a day.  She sees pulmonary medicine this week.  She is post to have a PET scan while hospitalized.  We have put that off until next week.  She is not hurting.  She has had no problems with nausea or vomiting.  She has had no change in bowel or bladder habits.  There is been no cough.  She has had no chest wall pain.  She has had no leg swelling.  She has had no rashes.  There is been no bleeding.  She has had no fever.  Overall, I would say performance status is probably ECOG 2.    Medications:  Allergies as of 03/24/2022       Reactions   Lyrica [pregabalin] Other (See Comments)   Hallucinations    Crestor [rosuvastatin Calcium] Other (See Comments)   Leg cramps        Medication List        Accurate as of March 24, 2022 10:28 AM. If you have any questions, ask your nurse or doctor.          acetaminophen 325 MG tablet Commonly known as: TYLENOL Take 1 tablet (325 mg total) by mouth every 6 (six) hours as needed for moderate pain.   albuterol 108 (90 Base) MCG/ACT  inhaler Commonly known as: VENTOLIN HFA Inhale 2 puffs into the lungs every 6 (six) hours as needed for wheezing or shortness of breath.   ALPRAZolam 0.5 MG tablet Commonly known as: XANAX TAKE ONE TABLET BY MOUTH THREE TIMES A DAY AS NEEDED What changed: reasons to take this   blood glucose meter kit and supplies Kit Use daily   enoxaparin 80 MG/0.8ML injection Commonly known as: LOVENOX Inject 0.7 mLs (70 mg total) into the skin daily.   FLUoxetine 20 MG capsule Commonly known as: PROZAC Take 1 capsule (20 mg total) by mouth daily.   Insulin Pen Needle 32G X 5 MM Misc Commonly known as: NovoTwist Use daily at Bedtime with Lantus   Lantus SoloStar 100 UNIT/ML Solostar Pen Generic drug: insulin glargine Inject 30 Units into the skin daily. HS   levothyroxine 125 MCG tablet Commonly known as: Synthroid Take 1 tablet (125 mcg total) by mouth daily before breakfast.   meloxicam 15 MG tablet Commonly known as: MOBIC Take 1 tablet (15 mg total) by mouth daily.   metFORMIN 500 MG 24 hr tablet Commonly known as: GLUCOPHAGE-XR Take 1 tablet (500 mg total) by mouth in the morning and at bedtime.   omeprazole  20 MG capsule Commonly known as: PRILOSEC Take 1 capsule (20 mg total) by mouth daily. What changed:  when to take this reasons to take this   ONE TOUCH ULTRA 2 w/Device Kit Check blood sugar as directed. Dx:E11.65   OneTouch Verio Flex System w/Device Kit USE AS DIRECTED TO CHECK BLOOD SUGAR FOUR TIMES A DAY   OneTouch Ultra test strip Generic drug: glucose blood USE TO CHECK BLOOD SUGAR FOUR TIMES A DAY AS DIRECTED   oxyCODONE-acetaminophen 7.5-325 MG tablet Commonly known as: Percocet Take 1 tablet by mouth every 6 (six) hours as needed for severe pain.   predniSONE 10 MG tablet Commonly known as: DELTASONE Take 3 tablets daily for 4 days, then 2 tablets daily for 4 days, then 1 tablet daily for 4 days, then 1/2 (half) tablet daily for 4 days    sulfamethoxazole-trimethoprim 800-160 MG tablet Commonly known as: BACTRIM DS Take 1 tablet by mouth 3 (three) times a week.   Trulicity 3.29 JM/4.2AS Sopn Generic drug: Dulaglutide Inject 0.75 mg into the skin every Saturday.   UNABLE TO FIND Please provide hair prothesis for patient undergoing chemotherapy for cancer diagnosis.   valACYclovir 1000 MG tablet Commonly known as: VALTREX TAKE ONE TABLET BY MOUTH DAILY AS NEEDED What changed:  how much to take reasons to take this   Xtampza ER 13.5 MG C12a Generic drug: oxyCODONE ER Take 13.5 mg by mouth 2 (two) times daily. What changed:  when to take this reasons to take this        Allergies:  Allergies  Allergen Reactions   Lyrica [Pregabalin] Other (See Comments)    Hallucinations    Crestor [Rosuvastatin Calcium] Other (See Comments)    Leg cramps    Past Medical History, Surgical history, Social history, and Family History were reviewed and updated.  Review of Systems: Review of Systems  Constitutional:  Positive for malaise/fatigue.  HENT: Negative.    Eyes: Negative.   Respiratory:  Positive for cough and shortness of breath.   Cardiovascular:  Positive for palpitations.  Gastrointestinal: Negative.   Genitourinary: Negative.   Musculoskeletal: Negative.   Skin: Negative.   Neurological: Negative.   Endo/Heme/Allergies: Negative.   Psychiatric/Behavioral: Negative.       Physical Exam:  height is _0  (1.499 m) and weight is 108 lb (49 kg). Her oral temperature is 97.6 F (36.4 C). Her blood pressure is 112/74 and her pulse is 101 (abnormal). Her respiration is 18 and oxygen saturation is 100%.   Wt Readings from Last 3 Encounters:  03/24/22 108 lb (49 kg)  03/20/22 109 lb (49.4 kg)  03/12/22 106 lb 7.7 oz (48.3 kg)   Physical Exam Vitals reviewed.  HENT:     Head: Normocephalic and atraumatic.  Eyes:     Pupils: Pupils are equal, round, and reactive to light.  Cardiovascular:      Rate and Rhythm: Normal rate and regular rhythm.     Heart sounds: Normal heart sounds.  Pulmonary:     Effort: Pulmonary effort is normal.     Breath sounds: Normal breath sounds.  Abdominal:     General: Bowel sounds are normal.     Palpations: Abdomen is soft.  Musculoskeletal:        General: No tenderness or deformity. Normal range of motion.     Cervical back: Normal range of motion.  Lymphadenopathy:     Cervical: No cervical adenopathy.  Skin:    General: Skin is warm and  dry.     Findings: No erythema or rash.  Neurological:     Mental Status: She is alert and oriented to person, place, and time.  Psychiatric:        Behavior: Behavior normal.        Thought Content: Thought content normal.        Judgment: Judgment normal.     Lab Results  Component Value Date   WBC 7.9 03/24/2022   HGB 11.5 (L) 03/24/2022   HCT 36.1 03/24/2022   MCV 94.3 03/24/2022   PLT 180 03/24/2022   Lab Results  Component Value Date   FERRITIN 1,198 (H) 03/05/2022   IRON 90 03/05/2022   TIBC 239 (L) 03/05/2022   UIBC 149 03/05/2022   IRONPCTSAT 38 (H) 03/05/2022   Lab Results  Component Value Date   RETICCTPCT 3.6 (H) 03/05/2022   RBC 3.83 (L) 03/24/2022   No results found for: "KPAFRELGTCHN", "LAMBDASER", "KAPLAMBRATIO" No results found for: "IGGSERUM", "IGA", "IGMSERUM" No results found for: "TOTALPROTELP", "ALBUMINELP", "A1GS", "A2GS", "BETS", "BETA2SER", "GAMS", "MSPIKE", "SPEI"   Chemistry      Component Value Date/Time   NA 134 (L) 03/24/2022 0920   K 4.1 03/24/2022 0920   CL 99 03/24/2022 0920   CO2 27 03/24/2022 0920   BUN 12 03/24/2022 0920   CREATININE 0.67 03/24/2022 0920   CREATININE 0.88 05/08/2021 1126      Component Value Date/Time   CALCIUM 9.3 03/24/2022 0920   ALKPHOS 53 03/24/2022 0920   AST 14 (L) 03/24/2022 0920   ALT 22 03/24/2022 0920   BILITOT 0.3 03/24/2022 0920       Impression and Plan: Ms. Ramella is a very pleasant 71 yo caucasian female  with extensive stage small cell lung cancer.  She has had chemotherapy and immunotherapy.  She then had radiation therapy for residual disease.  Unfortunately, she was hospitalized with a pulmonary embolism.  She had radiation pneumonitis.  She is improving.  Again, she will see Pulmonary Medicine this week.  I think she will have a chest x-ray done.  The key will be a PET scan next week.  When she was in the hospital, CT did seem to show a new adrenal lesion.  She is on Lovenox.  We will continue on the Lovenox for right now.  I just think that this is a better way to try to help her pulmonary embolism given that she has underlying malignancy.  I would like to see her back in about 3 weeks.  At that time, we will decide as to whether or not we need to do any more treatment on her. She has completed radiation therapy and now we will get a PET scan in 4 weeks to reassess per MD.  MD follow-up in 4 weeks.   [name], MD 12/19/202310:28 AM

## 2022-03-24 NOTE — Progress Notes (Signed)
Patient in office for post discharge follow up. She is doing well. PET scan scheduled for next week.  Oncology Nurse Navigator Documentation     03/24/2022   11:00 AM  Oncology Nurse Navigator Flowsheets  Navigator Follow Up Date: 04/02/2022  Navigator Follow Up Reason: Scan Review  Navigator Location [facility]  Navigator Encounter Type Appt/Treatment Plan Review  Patient Visit Type MedOnc  Treatment Phase Active Tx  Barriers/Navigation Needs Coordination of Care;Education  Interventions None Required  Acuity Level 2-Minimal Needs (1-2 Barriers Identified)  Support Groups/Services Friends and Family  Time Spent with Patient 15

## 2022-03-25 NOTE — Telephone Encounter (Signed)
Per patient's chart, HH is to be managed by PCP, not our office. Will close this encounter.

## 2022-03-26 ENCOUNTER — Encounter: Payer: Self-pay | Admitting: Nurse Practitioner

## 2022-03-26 ENCOUNTER — Ambulatory Visit (INDEPENDENT_AMBULATORY_CARE_PROVIDER_SITE_OTHER): Payer: PPO | Admitting: Nurse Practitioner

## 2022-03-26 VITALS — BP 110/78 | HR 104 | Temp 98.6°F | Ht 59.5 in | Wt 118.8 lb

## 2022-03-26 DIAGNOSIS — J189 Pneumonia, unspecified organism: Secondary | ICD-10-CM | POA: Diagnosis not present

## 2022-03-26 DIAGNOSIS — J9601 Acute respiratory failure with hypoxia: Secondary | ICD-10-CM

## 2022-03-26 DIAGNOSIS — I2699 Other pulmonary embolism without acute cor pulmonale: Secondary | ICD-10-CM | POA: Diagnosis not present

## 2022-03-26 DIAGNOSIS — C3412 Malignant neoplasm of upper lobe, left bronchus or lung: Secondary | ICD-10-CM | POA: Diagnosis not present

## 2022-03-26 NOTE — Assessment & Plan Note (Signed)
Resolved. She was able to maintain saturations on room air today. We will order ONO to rule out nocturnal hypoxemia and need for continued O2 therapy. I did advise her to keep her supplemental oxygen since she just completed steroids and I would like to ensure she is stable at her follow up appointment before we discontinue this. She will continue to monitor at home for goal >88-90%.

## 2022-03-26 NOTE — Assessment & Plan Note (Signed)
Likely provoked in the setting of pna/pneumonitis. She will need at least 3 months of therapy but given her history, she is high risk for recurrence so may need lifelong therapy. Will defer to oncology, who is currently managing this. Advised her to discuss with Dr. Marin Olp about alternative oral anticoagulation therapy and if this would be appropriate option for her. Aware of red flag symptoms and ED precautions while on therapy.

## 2022-03-26 NOTE — Assessment & Plan Note (Signed)
Extensive disease. Mets to liver, bone, lymph nodes. PET scan scheduled for next week. Follow up with oncology as scheduled.

## 2022-03-26 NOTE — Patient Instructions (Signed)
Continue Albuterol inhaler 2 puffs or duoneb every 6 hours as needed for shortness of breath or wheezing. Notify if symptoms persist despite rescue inhaler/neb use. Continue lovenox injections daily as directed by oncology  Continue to monitor your oxygen at home for goal >88-90%. If you are falling below this, use your supplemental oxygen 2 lpm. We will obtain oxygen study overnight to ensure you don't need to continue to use your oxygen at bedtime.   If you develop increased cough, worsening shortness of breath, fevers, or decreased oxygen levels, please come back for sooner follow up and repeat imaging  Follow up in 6 weeks with Dr. Lamonte Sakai and repeat CXR. If symptoms do not improve or worsen, please contact office for sooner follow up or seek emergency care.

## 2022-03-26 NOTE — Assessment & Plan Note (Addendum)
Hospitalized 02/25/2022-03/12/2022 for acute respiratory failure secondary to multifocal pna vs radiation induced pneumonitis, less likely PE. She completed antimicrobial therapy during her hospital stay. She was discharged on extended prednisone taper, bactrim for PJP prophylaxis, and supplemental O2. She is clinically improved. Has completed steroid course within the last few days. Instructed her that she should discontinue bactrim now as this was prophylaxis while on high dose steroids. Walking oximetry today without desaturations on room air. Plan to repeat imaging in 6 weeks to ensure resolution. She was provided with strict return/ED precautions.   Patient Instructions  Continue Albuterol inhaler 2 puffs or duoneb every 6 hours as needed for shortness of breath or wheezing. Notify if symptoms persist despite rescue inhaler/neb use. Continue lovenox injections daily as directed by oncology  Continue to monitor your oxygen at home for goal >88-90%. If you are falling below this, use your supplemental oxygen 2 lpm. We will obtain oxygen study overnight to ensure you don't need to continue to use your oxygen at bedtime.   If you develop increased cough, worsening shortness of breath, fevers, or decreased oxygen levels, please come back for sooner follow up and repeat imaging  Follow up in 6 weeks with Dr. Lamonte Sakai and repeat CXR. If symptoms do not improve or worsen, please contact office for sooner follow up or seek emergency care.

## 2022-03-26 NOTE — Progress Notes (Signed)
_0  ID: [name], female    DOB: 06-Aug-1950, 71 y.o.   MRN: [id number]  Chief Complaint  Patient presents with   Hospitalization Follow-up    Sats drop to 88% when she walks.    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  Followed by Dr. Marin Olp with oncology.  She is a patient of Dr. Agustina Caroli and last seen in office 06/25/2021.  Past medical history significant for diabetes, GERD with hiatal hernia, OSA on CPAP, osteoarthritis and DJD.  She was recently admitted from 02/25/2022 to 03/12/2022 for acute hypoxemic respiratory failure felt to be multifactorial related to pulmonary embolism and possible pneumonia versus radiation pneumonitis.  She had presented to the ED with hypoxia and URI associated with myalgias, chills, fever and urinary frequency and diarrhea.  COVID at home was negative.  CTA demonstrated small segmental and 6 subsegmental PE in right upper lobe and patchy infiltrates bilaterally concerning for pneumonia.  No evidence of right heart strain on echo. She was started on supplemental oxygen, ceftriaxone and and azithromycin as well as heparin drip.  Oxygen requirements decreased; still required supplemental oxygen with ambulation.  She was discharged home on 2 L.  Her antibiotics were completed during her hospital stay.  She was discharged on steroid taper for possible pneumonitis as well as Lovenox for management of her PE.  TEST/EVENTS:  02/25/2022 CTA chest: Segmental and subsegmental right upper lobe pulmonary artery suggestive of nonocclusive PE.  Hiatal hernia.  Unchanged lymphadenopathy.  Emphysema is present.  There is patchy GGO bilaterally.  Prior bone mets seen  03/26/2022: Today - follow up Patient presents today with her husband for hospital  follow-up.  She was recently had extended hospital stay from 11/22 to 12/7 for acute respiratory failure felt to be secondary to radiation induced pneumonitis versus CAP.  She completed antibiotic course while she was in the hospital.  She was discharged on extended prednisone taper and Bactrim for PJP prophylaxis.  She has completed her prednisone taper.  Did not realize that she was post to discontinue Bactrim after she reached doses lower than 20 mg daily.  She is still taking this 3 times a week.  She is also still wearing her oxygen.  Checking her levels at home.  Occasionally notices levels in the high 80s but most of the time she is remaining in the 90s.  Wants to be able to come off her oxygen.  Feels like her breathing has significantly improved.  She has a very minimal residual cough that is also improved.  Has not had any recurrence of fevers.  Denies any hemoptysis, chills, anorexia.  She is using her DuoNebs a few times a day, which is what she was told to do when she left the hospital.  She is on Lovenox injections for her pulmonary embolism.  Curious if she has to continue the shots or if there is another option.  She has PET scan scheduled for next week.  Allergies  Allergen Reactions   Lyrica [Pregabalin] Other (See Comments)    Hallucinations    Crestor [Rosuvastatin Calcium] Other (See Comments)    Leg cramps    Immunization History  Administered Date(s) Administered   DTaP 01/24/1979   Fluad Quad(high Dose 65+) 12/31/2018, 01/24/2020, 01/10/2021, 01/30/2022  Influenza Whole 01/22/2010, 02/05/2012   Influenza, High Dose Seasonal PF 01/29/2017, 02/14/2018   Influenza,inj,Quad PF,6+ Mos 02/19/2014   Influenza-Unspecified 02/19/2014, 01/17/2015, 01/28/2016   Janssen (J&J) SARS-COV-2 Vaccination 07/07/2019   Moderna SARS-COV2 Booster Vaccination 09/04/2020   Pneumococcal Conjugate-13 07/08/2015   Pneumococcal Polysaccharide-23 01/05/2009, 02/19/2014   Td 01/24/2007   Zoster,  Live 02/16/2012    Past Medical History:  Diagnosis Date   Arthritis    Cancer ([place])    Diabetes mellitus    Esophagitis    Goals of care, counseling/discussion 05/23/2021   Hiatal hernia    History of radiation therapy    Left Lung- 11/27/21-01/08/22- Dr. 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[place]) 06/06/2021   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[place]) 06/06/2021   Small cell lung cancer, left upper lobe ([place]) 06/06/2021   Thyroid disease    TMJ (sprain of temporomandibular joint)     Tobacco History: Social History   Tobacco Use  Smoking Status Former   Years: 48.00   Types: Cigarettes   Quit date: 02/01/2017   Years since quitting: 5.1  Smokeless Tobacco Never   Counseling given: Not Answered   Outpatient Medications Prior to Visit  Medication Sig Dispense Refill   acetaminophen (TYLENOL) 325 MG tablet Take 1 tablet (325 mg total) by mouth every 6 (six) hours as needed for moderate pain.     albuterol (VENTOLIN HFA) 108 (90 Base) MCG/ACT inhaler Inhale 2 puffs into the lungs every 6 (six) hours as needed for wheezing or shortness of breath. 8 g 2   ALPRAZolam (XANAX) 0.5 MG tablet TAKE ONE TABLET BY MOUTH THREE TIMES A DAY AS NEEDED (Patient taking differently: Take 0.5 mg by mouth 3 (three) times daily as needed for anxiety.) 90 tablet 0   blood glucose meter kit and supplies KIT Use daily 1 each 0   Blood Glucose Monitoring Suppl (ONE TOUCH ULTRA 2) w/Device KIT Check blood sugar as directed. Dx:E11.65 1 kit 0   Blood Glucose Monitoring Suppl (ONETOUCH VERIO FLEX SYSTEM) w/Device KIT USE AS DIRECTED TO CHECK BLOOD SUGAR FOUR TIMES A DAY 1 kit 0   enoxaparin (LOVENOX) 80 MG/0.8ML injection Inject 0.7 mLs (70 mg total) into the skin daily. 21 mL 3   FLUoxetine (PROZAC) 20 MG capsule Take 1  capsule (20 mg total) by mouth daily. 90 capsule 3   glucose blood (ONETOUCH ULTRA) test strip USE TO CHECK BLOOD SUGAR FOUR TIMES A DAY AS DIRECTED 400 strip 1   insulin glargine (LANTUS SOLOSTAR) 100 UNIT/ML Solostar Pen Inject 30 Units into the skin daily. HS     Insulin Pen Needle (NOVOTWIST) 32G X 5 MM MISC Use daily at Bedtime with Lantus 100 each 0   levothyroxine (SYNTHROID) 125 MCG tablet Take 1 tablet (125 mcg total) by mouth daily before breakfast. 90 tablet 3   meloxicam (MOBIC) 15 MG tablet Take 1 tablet (15 mg total) by mouth daily. 30 tablet 0   metFORMIN (GLUCOPHAGE-XR) 500 MG 24 hr tablet Take 1 tablet (500 mg total) by mouth in the morning and at bedtime. 180 tablet 0   omeprazole (PRILOSEC) 20 MG capsule Take 1 capsule (20 mg total) by mouth daily. (Patient taking differently: Take 20 mg by mouth daily as  needed (indigestion).) 90 capsule 3   oxyCODONE ER (XTAMPZA ER) 13.5 MG C12A Take 13.5 mg by mouth 2 (two) times daily. (Patient taking differently: Take 13.5 mg by mouth 2 (two) times daily as needed (pain).) 60 capsule 0   oxyCODONE-acetaminophen (PERCOCET) 7.5-325 MG tablet Take 1 tablet by mouth every 6 (six) hours as needed for severe pain. 90 tablet 0   TRULICITY 5.45 GY/5.6LS SOPN Inject 0.75 mg into the skin every Saturday.     UNABLE TO FIND Please provide hair prothesis for patient undergoing chemotherapy for cancer diagnosis. 1 Units 0   valACYclovir (VALTREX) 1000 MG tablet TAKE ONE TABLET BY MOUTH DAILY AS NEEDED (Patient taking differently: Take 500 mg by mouth daily as needed (prevent outbreaks).) 30 tablet 0   sulfamethoxazole-trimethoprim (BACTRIM DS) 800-160 MG tablet Take 1 tablet by mouth 3 (three) times a week. 30 tablet 2   predniSONE (DELTASONE) 10 MG tablet Take 3 tablets daily for 4 days, then 2 tablets daily for 4 days, then 1 tablet daily for 4 days, then 1/2 (half) tablet daily for 4 days (Patient not taking: Reported on 03/26/2022) 26 tablet 0    Facility-Administered Medications Prior to Visit  Medication Dose Route Frequency Provider Last Rate Last Admin   ipratropium-albuterol (DUONEB) 0.5-2.5 (3) MG/3ML nebulizer solution 3 mL  3 mL Nebulization Q6H Lowne Chase, Yvonne R, DO       sodium chloride flush (NS) 0.9 % injection 10 mL  10 mL Intravenous PRN Celso Amy, NP   10 mL at 08/18/21 9373     Review of Systems:   Constitutional: No weight loss or gain, night sweats, fevers, chills, +fatigue (improving), or lassitude. HEENT: No headaches, difficulty swallowing, tooth/dental problems, or sore throat. No sneezing, itching, ear ache, nasal congestion, or post nasal drip CV:  No chest pain, orthopnea, PND, swelling in lower extremities, anasarca, dizziness, palpitations, syncope Resp: +shortness of breath with exertion (improved); minimal dry cough. No excess mucus or change in color of mucus. No hemoptysis. No wheezing.  No chest wall deformity GI:  No heartburn, indigestion, abdominal pain, nausea, vomiting, diarrhea, change in bowel habits, loss of appetite, bloody stools.  Skin: No rash, lesions, ulcerations, bruising  MSK:  No joint pain or swelling.   Neuro: No dizziness or lightheadedness.  Psych: No depression or anxiety. Mood stable.     Physical Exam:  BP 110/78 (BP Location: Right Arm, Patient Position: Sitting, Cuff Size: Normal)   Pulse (!) 104   Temp 98.6 F (37 C) (Oral)   Ht 4' 11.5" (1.511 m)   Wt 118 lb 12.8 oz (53.9 kg)   SpO2 96%   BMI 23.59 kg/m   GEN: Pleasant, interactive, well-kempt; in no acute distress. HEENT:  Normocephalic and atraumatic. PERRLA. Sclera white. Nasal turbinates pink, moist and patent bilaterally. No rhinorrhea present. Oropharynx pink and moist, without exudate or edema. No lesions, ulcerations, or postnasal drip.  NECK:  Supple w/ fair ROM. No JVD present. Normal carotid impulses w/o bruits. Thyroid symmetrical with no goiter or nodules palpated. No lymphadenopathy.    CV: RRR, no m/r/g, no peripheral edema. Pulses intact, +2 bilaterally. No cyanosis, pallor or clubbing. PULMONARY:  Unlabored, regular breathing. Clear bilaterally A&P w/o wheezes/rales/rhonchi. No accessory muscle use.  GI: BS present and normoactive. Soft, non-tender to palpation. No organomegaly or masses detected.  MSK: No erythema, warmth or tenderness. Cap refil <2 sec all extrem. No deformities or joint swelling noted.  Neuro: A/Ox3. No focal deficits  noted.   Skin: Warm, no lesions or rashe Psych: Normal affect and behavior. Judgement and thought content appropriate.     Lab Results:  CBC    Component Value Date/Time   WBC 7.9 03/24/2022 0920   WBC 11.0 (H) 03/11/2022 0502   RBC 3.83 (L) 03/24/2022 0920   HGB 11.5 (L) 03/24/2022 0920   HCT 36.1 03/24/2022 0920   PLT 180 03/24/2022 0920   MCV 94.3 03/24/2022 0920   MCH 30.0 03/24/2022 0920   MCHC 31.9 03/24/2022 0920   RDW 14.0 03/24/2022 0920   LYMPHSABS 0.7 03/24/2022 0920   MONOABS 0.6 03/24/2022 0920   EOSABS 0.3 03/24/2022 0920   BASOSABS 0.0 03/24/2022 0920    BMET    Component Value Date/Time   NA 134 (L) 03/24/2022 0920   K 4.1 03/24/2022 0920   CL 99 03/24/2022 0920   CO2 27 03/24/2022 0920   GLUCOSE 296 (H) 03/24/2022 0920   BUN 12 03/24/2022 0920   CREATININE 0.67 03/24/2022 0920   CREATININE 0.88 05/08/2021 1126   CALCIUM 9.3 03/24/2022 0920   GFRNONAA >60 03/24/2022 0920   GFRAA >60 10/16/2018 2157    BNP    Component Value Date/Time   BNP 35.1 03/01/2022 0500     Imaging:  DG CHEST PORT 1 VIEW  Result Date: 03/11/2022 CLINICAL DATA:  Shortness of [id number] SOB (shortness of breath) [id number] EXAM: PORTABLE CHEST 1 VIEW COMPARISON:  03/10/2022 FINDINGS: Port in the anterior chest wall with tip in distal SVC. Normal cardiac silhouette. LEFT lingular pneumonia unchanged. Increased airspace density in the RIGHT lower lobe. Upper lungs are clear. No pneumothorax. Anterior and posterior  cervical fusion. IMPRESSION: 1. Dense LEFT lingular pneumonia. 2. Increasing subtle RIGHT lower lobe pneumonia. Electronically Signed   By: Suzy Bouchard M.D.   On: 03/11/2022 08:06   DG CHEST PORT 1 VIEW  Result Date: 03/10/2022 CLINICAL DATA:  71 year old female presenting with shortness of breath. EXAM: PORTABLE CHEST 1 VIEW COMPARISON:  March 08, 2022 FINDINGS: RIGHT IJ Port-A-Cath terminates at the upper RIGHT atrium similar to prior imaging. Heart size stable and accentuated by portable technique and AP projection. Obscured LEFT heart border on frontal view with interstitial and alveolar opacities throughout the LEFT lower chest. Patchy opacities also at the RIGHT lung base. No pneumothorax. On limited assessment no acute skeletal process. IMPRESSION: 1. Findings suspicious for lingular pneumonia, potentially with early involvement of the RIGHT lung base. 2. RIGHT IJ Port-A-Cath terminates at the upper RIGHT atrium. Electronically Signed   By: Zetta Bills M.D.   On: 03/10/2022 08:13   MR LIVER W WO CONTRAST  Result Date: 03/08/2022 CLINICAL DATA:  Elevated liver function tests. Possible hepatic mass on recent ultrasound. Small cell lung carcinoma. EXAM: MRI ABDOMEN WITHOUT AND WITH CONTRAST TECHNIQUE: Multiplanar multisequence MR imaging of the abdomen was performed both before and after the administration of intravenous contrast. CONTRAST:  30m GADAVIST GADOBUTROL 1 MMOL/ML IV SOLN COMPARISON:  Ultrasound on 03/06/2022 FINDINGS: Lower chest: No acute findings. Hepatobiliary: Diffuse T2 hypointensity of the hepatic parenchyma is consistent with iron overload. A homogeneous mass is seen in the liver dome which shows marked T2 hyperintensity, and measures 2.3 x 2.2 cm on image 7/4. This shows mild arterial phase hyperenhancement which persists on subsequent phases, and shows no evidence of washout or delayed peripheral rim enhancement. This does not have typical features of liver metastasis,  differential diagnosis also includes benign etiology such as atypical hemangioma and adenoma, with focal  nodular hyperplasia considered less likely. No other hepatic masses are identified. Prior cholecystectomy. Mild dilatation of common bile duct is seen measuring 12 mm. No evidence of choledocholithiasis or intrahepatic biliary ductal dilatation. Pancreas: No mass or inflammatory changes. No evidence of pancreatic ductal dilatation. Spleen: Within normal limits in size. Diffuse T2 hypointensity of the splenic parenchyma is consistent with iron overload. Adrenals/Urinary Tract: A 1.3 x 1.2 cm homogeneous mass is seen involving the right adrenal gland, which measures 1.3 x 1.2 cm on image 18/4. This mass has nonspecific characteristics, but is new compared to prior PET-CT on 11/03/2021. This raises suspicion for a adrenal metastasis. A small benign-appearing right renal cyst is noted. No suspicious renal masses identified. No evidence of hydronephrosis. Stomach/Bowel: Small hiatal hernia is seen.  Otherwise unremarkable. Vascular/Lymphatic: No pathologically enlarged lymph nodes identified. No acute vascular findings. Other:  None. Musculoskeletal: Diffuse T2 hypointensity of the bone marrow is consistent with iron overload. Several bone lesions in the thoracic spine are stable and consistent with bone metastases. 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 Consider further evaluation with PET-CT or tissue sampling. Solitary 2.3 cm mass in the liver dome. This does not have a typical appearance for metastasis, and differential diagnosis also includes benign etiologies such as atypical hemangioma, adenoma, less likely focal nodular hyperplasia. Consider further evaluation with PET-CT or tissue sampling. Prior cholecystectomy. Mild dilatation of common bile duct, without evidence of choledocholithiasis or other obstructing etiology. Stable appearance of bone metastases  in the thoracic spine. Hemosiderosis. Electronically Signed   By: Marlaine Hind M.D.   On: 03/08/2022 16:04   DG CHEST PORT 1 VIEW  Result Date: 03/08/2022 CLINICAL DATA:  Shortness of breath and dyspnea. EXAM: PORTABLE CHEST 1 VIEW COMPARISON:  March 07, 2022 FINDINGS: The heart size and mediastinal contours are stable. The heart size is mildly enlarged. Right central venous line is identified unchanged. Patchy consolidation of left lung base is noted. Minimal opacity of the medial right lung base is improved. The visualized skeletal structures are stable. IMPRESSION: Left lung base pneumonia. Medial right lung base opacity is improved compared to prior exam. Electronically Signed   By: Abelardo Diesel M.D.   On: 03/08/2022 09:06   DG CHEST PORT 1 VIEW  Result Date: 03/07/2022 CLINICAL DATA:  141880 SOB (shortness of breath) 141880 EXAM: PORTABLE CHEST - 1 VIEW COMPARISON:  03/06/2022 FINDINGS: The mediastinal contours are within normal limits. No cardiomegaly. Indwelling right IJ Port-A-Cath in unchanged position with the catheter tip near the cavoatrial junction. Atherosclerotic calcification of the aortic arch similar appearing left greater than right hazy interstitial opacities, most prominent in the lingula. No acute osseous abnormality. Partially visualized cervical fusion hardware, unchanged. Surgical clips in the right upper quadrant, unchanged. IMPRESSION: 1. Similar appearing bibasilar, left greater than right hazy opacities, again favoring multifocal pneumonia in the appropriate clinical setting. 2.  Aortic Atherosclerosis (ICD10-I70.0). Electronically Signed   By: Ruthann Cancer M.D.   On: 03/07/2022 08:07   US Abdomen Limited RUQ (LIVER/GB)  Result Date: 03/06/2022 CLINICAL DATA:  Abnormal LFTs. EXAM: ULTRASOUND ABDOMEN LIMITED RIGHT UPPER QUADRANT COMPARISON:  None Available. FINDINGS: Gallbladder: Surgically absent Common bile duct: Diameter: 3 mm on limited imaging Liver: A masslike  hypoechoic region is identified in the right hepatic lobe measuring 2.9 x 3.2 x 2.0 cm. Portal vein is patent on color Doppler imaging with normal direction of blood flow towards the liver. Other: None. IMPRESSION: 1. A masslike hypoechoic region  is identified in the right hepatic lobe measuring up to 3.2 cm. A mass is not excluded. Recommend an MRI of the abdomen with and without contrast for better evaluation of this location. 2. The gallbladder and visualized common bile duct are unremarkable. Electronically Signed   By: Dorise Bullion III M.D.   On: 03/06/2022 18:58   DG CHEST PORT 1 VIEW  Result Date: 03/06/2022 CLINICAL DATA:  14:18 a.m. 0 with shortness of breath. EXAM: PORTABLE CHEST 1 VIEW COMPARISON:  Portable chest yesterday at 5:06 a.m. FINDINGS: 4:16 a.m. Right IJ port catheter again terminates in the right atrium. Multiple overlying monitor wires. Interstitial and patchy hazy opacities of the left-greater-than-right lower lung fields continue to be noted without significant improvement or worsening. The right mid, both upper lung fields remain clear. No pleural effusion is seen. There is mild cardiomegaly but no vascular congestion findings. Stable mediastinum with aortic atherosclerosis. Fusion hardware lower C-spine. IMPRESSION: 1. Interstitial and patchy hazy opacities of the left-greater-than-right lower lung fields continue to be noted without significant improvement or worsening. Probable multilobar pneumonia. 2. Aortic atherosclerosis. Electronically Signed   By: Telford Nab M.D.   On: 03/06/2022 07:35   DG CHEST PORT 1 VIEW  Result Date: 03/05/2022 CLINICAL DATA:  Shortness of breath. EXAM: PORTABLE CHEST 1 VIEW COMPARISON:  Radiographs 03/04/2022 and 03/02/2022.  CT 02/25/2022. FINDINGS: 0506 hours. There are slightly lower lung volumes. Right IJ Port-A-Cath tip is unchanged at the level of the mid right atrium. The heart size and mediastinal contours are stable. There are  increased left-greater-than-right basilar opacities. No evidence of pneumothorax or significant pleural effusion. The bones appear unchanged. Postsurgical changes are present in the cervicothoracic spine. Telemetry leads overlie the chest. IMPRESSION: Lower lung volumes with increased left-greater-than-right basilar opacities, likely atelectasis superimposed on underlying pneumonia. No other significant changes. Electronically Signed   By: Richardean Sale M.D.   On: 03/05/2022 08:12   DG Chest 1 View  Result Date: 03/04/2022 CLINICAL DATA:  Pulmonary edema, lung cancer EXAM: CHEST  1 VIEW COMPARISON:  03/02/2022. FINDINGS: Alveolar density mid to lower left lung consistent with pneumonia. Vascular congestion. No pneumothorax. No definite pleural effusion. Calcified aorta. Right-sided Port-A-Cath tip overlies distal SVC. IMPRESSION: Vascular congestion. Alveolar density left mid to lower lung, likely pneumonia. Follow-up recommended to confirm resolution and exclude other etiologies. Electronically Signed   By: Sammie Bench M.D.   On: 03/04/2022 08:23   DG CHEST PORT 1 VIEW  Result Date: 03/02/2022 CLINICAL DATA:  Acute respiratory failure with hypoxia. EXAM: PORTABLE CHEST 1 VIEW COMPARISON:  AP chest 03/01/2022, chest two views 02/28/2022, AP chest 02/25/2022, chest two views 07/25/2021;, PET-CT 11/03/2021 FINDINGS: Right chest wall porta catheter tip again overlies the superior aspect of the right atrium. Cardiac silhouette is again at the upper limits of normal size. Mediastinal contours are within normal limits. Left mid and lower lung and right lower lung interstitial thickening is similar to multiple prior radiographs from 03/01/2022 through 02/25/2022. Mildly increased hazy opacification of the left mid and lower lung and right lower lung compared to 07/25/2020. No definite pleural effusion. No pneumothorax. Minimal dextrocurvature of the midthoracic spine. IMPRESSION: Mildly increased hazy  opacification of the left mid and lower lung and right lower lung compared to 07/25/2020 but unchanged from more recent November 2023 radiographs over the course of this past week. Has this patient undergone radiation therapy for left hilar lymphadenopathy seen on 11/03/2021 PET-CT? If so, some of the lung findings may  represent postradiation change. Otherwise, differential considerations include chronic interstitial thickening with superimposed pulmonary edema versus multifocal infection within the left-greater-than-right lungs. Electronically Signed   By: Yvonne Kendall M.D.   On: 03/02/2022 08:34   DG CHEST PORT 1 VIEW  Result Date: 03/01/2022 CLINICAL DATA:  Hypoxemic respiratory failure. EXAM: PORTABLE CHEST 1 VIEW COMPARISON:  02/28/2022 FINDINGS: The right IJ power port is stable. The cardiac silhouette, mediastinal and hilar contours are within normal limits and unchanged. Persistent but slightly improved perihilar airspace process, likely pulmonary edema. No pleural effusions. IMPRESSION: Persistent but slightly improved perihilar airspace process, likely pulmonary edema. Electronically Signed   By: Marijo Sanes M.D.   On: 03/01/2022 09:13   ECHOCARDIOGRAM COMPLETE  Result Date: 02/28/2022    ECHOCARDIOGRAM REPORT   Patient Name:   DEARA BOBER Goggins Date of Exam: 02/28/2022 Medical Rec #:  [id number]     Height:       59.5 in Accession #:    [id number]    Weight:       116.8 lb Date of Birth:  09-01-1950     BSA:          1.476 m Patient Age:    27 years      BP:           124/76 mmHg Patient Gender: F             HR:           95 bpm. Exam Location:  Inpatient Procedure: 2D Echo Indications:    pulmonary edema  History:        Patient has no prior history of Echocardiogram examinations.                 Risk Factors:Current Smoker, Diabetes and Dyslipidemia.  Sonographer:    Harvie Junior Referring Phys: [id number] CHI JANE ELLISON  Sonographer Comments: Technically difficult study due to poor echo windows  and echo performed with patient supine and on artificial respirator. Image acquisition challenging due to respiratory motion. IMPRESSIONS  1. Left ventricular ejection fraction, by estimation, is 60 to 65%. The left ventricle has normal function. Left ventricular endocardial border not optimally defined to evaluate regional wall motion. Left ventricular diastolic parameters are consistent with Grade I diastolic dysfunction (impaired relaxation).  2. Right ventricular systolic function is normal. The right ventricular size is normal. There is normal pulmonary artery systolic pressure. The estimated right ventricular systolic pressure is 69.6 mmHg.  3. A small pericardial effusion is present. The pericardial effusion is anterior to the right ventricle.  4. The mitral valve is grossly normal. Mild mitral valve regurgitation.  5. The aortic valve is tricuspid. Aortic valve regurgitation is not visualized. Aortic valve sclerosis is present, with no evidence of aortic valve stenosis. Aortic valve mean gradient measures 3.0 mmHg.  6. The inferior vena cava is normal in size with greater than 50% respiratory variability, suggesting right atrial pressure of 3 mmHg. Comparison(s): No prior Echocardiogram. FINDINGS  Left Ventricle: Left ventricular ejection fraction, by estimation, is 60 to 65%. The left ventricle has normal function. Left ventricular endocardial border not optimally defined to evaluate regional wall motion. The left ventricular internal cavity size was normal in size. There is no left ventricular hypertrophy. Left ventricular diastolic parameters are consistent with Grade I diastolic dysfunction (impaired relaxation). Right Ventricle: The right ventricular size is normal. No increase in right ventricular wall thickness. Right ventricular systolic function is normal. There is normal pulmonary artery systolic pressure.  The tricuspid regurgitant velocity is 2.48 m/s, and  with an assumed right atrial pressure of  3 mmHg, the estimated right ventricular systolic pressure is 40.1 mmHg. Left Atrium: Left atrial size was normal in size. Right Atrium: Right atrial size was normal in size. Pericardium: A small pericardial effusion is present. The pericardial effusion is anterior to the right ventricle. Mitral Valve: The mitral valve is grossly normal. Mild mitral valve regurgitation. Tricuspid Valve: The tricuspid valve is grossly normal. Tricuspid valve regurgitation is trivial. Aortic Valve: The aortic valve is tricuspid. Aortic valve regurgitation is not visualized. Aortic valve sclerosis is present, with no evidence of aortic valve stenosis. Aortic valve mean gradient measures 3.0 mmHg. Aortic valve peak gradient measures 5.4  mmHg. Aortic valve area, by VTI measures 2.11 cm. Pulmonic Valve: The pulmonic valve was not well visualized. Pulmonic valve regurgitation is not visualized. Aorta: The aortic root is normal in size and structure. Venous: The inferior vena cava is normal in size with greater than 50% respiratory variability, suggesting right atrial pressure of 3 mmHg. IAS/Shunts: No atrial level shunt detected by color flow Doppler.  LEFT VENTRICLE PLAX 2D LVIDd:         3.80 cm     Diastology LVIDs:         3.00 cm     LV e' medial:    4.68 cm/s LV PW:         0.90 cm     LV E/e' medial:  18.1 LV IVS:        1.00 cm     LV e' lateral:   4.68 cm/s LVOT diam:     1.80 cm     LV E/e' lateral: 18.1 LV SV:         43 LV SV Index:   29 LVOT Area:     2.54 cm  LV Volumes (MOD) LV vol d, MOD A2C: 36.6 ml LV vol d, MOD A4C: 58.7 ml LV vol s, MOD A2C: 20.3 ml LV vol s, MOD A4C: 28.4 ml LV SV MOD A2C:     16.3 ml LV SV MOD A4C:     58.7 ml LV SV MOD BP:      24.7 ml RIGHT VENTRICLE RV Basal diam:  3.00 cm RV Mid diam:    2.30 cm TAPSE (M-mode): 1.3 cm LEFT ATRIUM             Index        RIGHT ATRIUM          Index LA diam:        3.00 cm 2.03 cm/m   RA Area:     7.13 cm LA Vol (A2C):   19.6 ml 13.28 ml/m  RA Volume:   11.20  ml 7.59 ml/m LA Vol (A4C):   14.1 ml 9.55 ml/m LA Biplane Vol: 17.9 ml 12.13 ml/m  AORTIC VALVE                    PULMONIC VALVE AV Area (Vmax):    1.99 cm     PV Vmax:       0.99 m/s AV Area (Vmean):   1.95 cm     PV Peak grad:  3.9 mmHg AV Area (VTI):     2.11 cm AV Vmax:           116.00 cm/s AV Vmean:          80.200 cm/s AV VTI:  0.205 m AV Peak Grad:      5.4 mmHg AV Mean Grad:      3.0 mmHg LVOT Vmax:         90.70 cm/s LVOT Vmean:        61.400 cm/s LVOT VTI:          0.170 m LVOT/AV VTI ratio: 0.83  AORTA Ao Root diam: 2.80 cm MITRAL VALVE                TRICUSPID VALVE MV Area (PHT): 4.49 cm     TR Peak grad:   24.6 mmHg MV Decel Time: 169 msec     TR Vmax:        248.00 cm/s MR Peak grad: 103.1 mmHg MR Vmax:      507.80 cm/s   SHUNTS MV E velocity: 84.80 cm/s   Systemic VTI:  0.17 m MV A velocity: 115.00 cm/s  Systemic Diam: 1.80 cm MV E/A ratio:  0.74 Rozann Lesches MD Electronically signed by Rozann Lesches MD Signature Date/Time: 02/28/2022/1:48:44 PM    Final    DG Chest 2 View  Result Date: 02/28/2022 CLINICAL DATA:  Shortness of breath. EXAM: CHEST - 2 VIEW COMPARISON:  February 25, 2022. FINDINGS: Stable cardiomediastinal silhouette. Right internal jugular Port-A-Cath is unchanged in position. Mildly increased bilateral diffuse reticular opacities are noted concerning for worsening edema or atypical infection. Bony thorax is unremarkable. IMPRESSION: Increased bilateral lung opacities as described above. Electronically Signed   By: Marijo Conception M.D.   On: 02/28/2022 08:57   VAS [place] LOWER EXTREMITY VENOUS (DVT)  Result Date: 02/28/2022  Lower Venous DVT Study Patient Name:  SHONA PARDO  Date of Exam:   02/26/2022 Medical Rec #: [id number]      Accession #:    [id number] Date of Birth: Dec 15, 1950      Patient Gender: F Patient Age:   84 years Exam Location:  [facility] Procedure:      VAS [place] LOWER EXTREMITY VENOUS (DVT) Referring Phys: Irine Seal  --------------------------------------------------------------------------------  Indications: Pulmonary embolism.  Limitations: Poor ultrasound/tissue interface. Comparison Study: No prior studies. Performing Technologist: Oliver Hum RVT  Examination Guidelines: A complete evaluation includes B-mode imaging, spectral Doppler, color Doppler, and power Doppler as needed of all accessible portions of each vessel. Bilateral testing is considered an integral part of a complete examination. Limited examinations for reoccurring indications may be performed as noted. The reflux portion of the exam is performed with the patient in reverse Trendelenburg.  +---------+---------------+---------+-----------+----------+--------------+ RIGHT    CompressibilityPhasicitySpontaneityPropertiesThrombus Aging +---------+---------------+---------+-----------+----------+--------------+ CFV      Full           Yes      Yes                                 +---------+---------------+---------+-----------+----------+--------------+ SFJ      Full                                                        +---------+---------------+---------+-----------+----------+--------------+ FV Prox  Full                                                        +---------+---------------+---------+-----------+----------+--------------+  FV Mid   Full                                                        +---------+---------------+---------+-----------+----------+--------------+ FV DistalFull                                                        +---------+---------------+---------+-----------+----------+--------------+ PFV      Full                                                        +---------+---------------+---------+-----------+----------+--------------+ POP      Full           Yes      Yes                                  +---------+---------------+---------+-----------+----------+--------------+ PTV      Full                                                        +---------+---------------+---------+-----------+----------+--------------+ PERO     Full                                                        +---------+---------------+---------+-----------+----------+--------------+   +---------+---------------+---------+-----------+----------+--------------+ LEFT     CompressibilityPhasicitySpontaneityPropertiesThrombus Aging +---------+---------------+---------+-----------+----------+--------------+ CFV      Full           Yes      Yes                                 +---------+---------------+---------+-----------+----------+--------------+ SFJ      Full                                                        +---------+---------------+---------+-----------+----------+--------------+ FV Prox  Full                                                        +---------+---------------+---------+-----------+----------+--------------+ FV Mid   Full                                                        +---------+---------------+---------+-----------+----------+--------------+  FV DistalFull                                                        +---------+---------------+---------+-----------+----------+--------------+ PFV      Full                                                        +---------+---------------+---------+-----------+----------+--------------+ POP      Full           Yes      Yes                                 +---------+---------------+---------+-----------+----------+--------------+ PTV      Full                                                        +---------+---------------+---------+-----------+----------+--------------+ PERO     Full                                                         +---------+---------------+---------+-----------+----------+--------------+     Summary: RIGHT: - There is no evidence of deep vein thrombosis in the lower extremity.  - No cystic structure found in the popliteal fossa.  LEFT: - There is no evidence of deep vein thrombosis in the lower extremity.  - No cystic structure found in the popliteal fossa.  *See table(s) above for measurements and observations. Electronically signed by Harold Barban MD on 02/28/2022 at 12:15:18 AM.    Final    CT Angio Chest PE W/Cm &/Or Wo Cm  Result Date: 02/25/2022 CLINICAL DATA:  Pulmonary embolus suspected with high probability. EXAM: CT ANGIOGRAPHY CHEST WITH CONTRAST TECHNIQUE: Multidetector CT imaging of the chest was performed using the standard protocol during bolus administration of intravenous contrast. Multiplanar CT image reconstructions and MIPs were obtained to evaluate the vascular anatomy. RADIATION DOSE REDUCTION: This exam was performed according to the departmental dose-optimization program which includes automated exposure control, adjustment of the mA and/or kV according to patient size and/or use of iterative reconstruction technique. CONTRAST:  4m OMNIPAQUE IOHEXOL 350 MG/ML SOLN COMPARISON:  PET-CT 11/03/2021.  CT chest 05/14/2021 FINDINGS: Cardiovascular: Good opacification of the central and segmental pulmonary arteries with technically adequate study. Intraluminal filling defects are demonstrated in segmental and subsegmental right upper lobe pulmonary arteries. This is consistent with acute nonocclusive pulmonary embolus. No additional emboli are demonstrated. RA to RV ratio is normal suggesting no evidence of heart strain. Normal heart size. No pericardial effusions. Normal caliber thoracic aorta. No aortic dissection. Great vessel origins are patent. Mediastinum/Nodes: Thyroid gland is unremarkable. Central venous catheter with tip in the cavoatrial junction. Esophagus is decompressed. Small esophageal  hiatal hernia. Lymphadenopathy in the mediastinum with pretracheal lymph nodes measuring up to 1.3 cm  in diameter. Lymphadenopathy is similar to previous PET-CT and decreased since prior CT. Lungs/Pleura: Emphysematous changes in the lungs. Patchy interstitial and ground-glass infiltrates demonstrated in both lungs. Changes are new since previous studies, likely indicating multifocal pneumonia. Edema would be less likely to have this distribution. There is peribronchial thickening with mild bronchiectasis. No pleural effusions. No pneumothorax. Upper Abdomen: Surgical absence of the gallbladder. Suggestion of prominent periaortic lymph nodes but incompletely included within the field of view. Nodes measure up to 1.1 cm diameter. Abdominal lymphadenopathy may be progressing since previous study. Musculoskeletal: Degenerative changes in the spine. Postoperative changes in the cervical spine. Multiple mixed sclerotic and lucent lesions throughout the thoracic vertebrae consistent with metastatic disease. Similar appearance to previous studies. Review of the MIP images confirms the above findings. IMPRESSION: 1. Positive examination for pulmonary emboli demonstrated focally in right upper lung distal segmental and subsegmental vessels. 2. Developing patchy airspace and interstitial infiltrates in the lungs most likely representing multifocal pneumonia. 3. Prominent mediastinal and abdominal lymph nodes, likely metastatic. 4. Multiple bone metastases again demonstrated. Critical Value/emergent results were called by telephone at the time of interpretation on 02/25/2022 at 7:45 pm to provider Fredia Sorrow , who verbally acknowledged these results. Electronically Signed   By: Lucienne Capers M.D.   On: 02/25/2022 19:53   DG Chest Port 1 View  Result Date: 02/25/2022 CLINICAL DATA:  Shortness of breath EXAM: PORTABLE CHEST 1 VIEW COMPARISON:  Chest x-ray July 25, 2020. FINDINGS: Chronic streaky bibasilar  opacities. No confluent consolidation. No visible pleural effusions or pneumothorax. Right IJ approach Port-A-Cath with the tip projecting at the right atrium. Cardiomediastinal silhouette is unchanged. Partially imaged cervical fusion hardware. IMPRESSION: Chronic streaky bibasilar opacities, which may represent atelectasis/scar given unchanged appearance. Superimposed atypical infection is difficult to exclude. No confluent consolidation. Electronically Signed   By: Margaretha Sheffield M.D.   On: 02/25/2022 16:17    heparin lock flush 100 unit/mL     Date Action Dose Route User   Discharged on 03/12/2022   Admitted on 02/25/2022   Discharged on 02/25/2022   Admitted on 02/25/2022   02/02/2022 1036 Given 500 Units Intravenous Lucile Crater, RN      sodium chloride flush (NS) 0.9 % injection 10 mL     Date Action Dose Route User   Discharged on 03/12/2022   Admitted on 02/25/2022   Discharged on 02/25/2022   Admitted on 02/25/2022   02/02/2022 1035 Given 10 mL Intravenous Lucile Crater, RN           No data to display          No results found for: "NITRICOXIDE"      Assessment & Plan:   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 She completed antimicrobial therapy during her hospital stay. She was discharged on extended prednisone taper, bactrim for PJP prophylaxis, and supplemental O2. She is clinically improved. Has completed steroid course within the last few days. Instructed her that she should discontinue bactrim now as this was prophylaxis while on high dose steroids. Walking oximetry today without desaturations on room air. Plan to repeat imaging in 6 weeks to ensure resolution. She was provided with strict return/ED precautions.   Patient Instructions  Continue Albuterol inhaler 2 puffs or duoneb every 6 hours as  needed for shortness of breath or wheezing. Notify if symptoms persist despite rescue inhaler/neb use. Continue  lovenox injections daily as directed by oncology  Continue to monitor your oxygen at home for goal >88-90%. If you are falling below this, use your supplemental oxygen 2 lpm. We will obtain oxygen study overnight to ensure you don't need to continue to use your oxygen at bedtime.   If you develop increased cough, worsening shortness of breath, fevers, or decreased oxygen levels, please come back for sooner follow up and repeat imaging  Follow up in 6 weeks with Dr. Lamonte Sakai and repeat CXR. If symptoms do not improve or worsen, please contact office for sooner follow up or seek emergency care.    Acute respiratory failure with hypoxia (HCC) Resolved. She was able to maintain saturations on room air today. We will order ONO to rule out nocturnal hypoxemia and need for continued O2 therapy. I did advise her to keep her supplemental oxygen since she just completed steroids and I would like to ensure she is stable at her follow up appointment before we discontinue this. She will continue to monitor at home for goal >88-90%.   Pulmonary embolism ([place]) Likely provoked in the setting of pna/pneumonitis. She will need at least 3 months of therapy but given her history, she is high risk for recurrence so may need lifelong therapy. Will defer to oncology, who is currently managing this. Advised her to discuss with Dr. Marin Olp about alternative oral anticoagulation therapy and if this would be appropriate option for her. Aware of red flag symptoms and ED precautions while on therapy.  Small cell lung cancer, left upper lobe (HCC) Extensive disease. Mets to liver, bone, lymph nodes. PET scan scheduled for next week. Follow up with oncology as scheduled.    I spent 40 minutes of dedicated to the care of this patient on the date of this encounter to include pre-visit review of records, face-to-face time  with the patient discussing conditions above, post visit ordering of testing, clinical documentation with the electronic health record, making appropriate referrals as documented, and communicating necessary findings to members of the patients care team.  Clayton Bibles, NP 03/26/2022  Pt aware and understands NP's role.

## 2022-03-27 ENCOUNTER — Other Ambulatory Visit: Payer: Self-pay | Admitting: Family Medicine

## 2022-03-27 ENCOUNTER — Telehealth: Payer: Self-pay | Admitting: Family Medicine

## 2022-03-27 DIAGNOSIS — J449 Chronic obstructive pulmonary disease, unspecified: Secondary | ICD-10-CM

## 2022-03-27 MED ORDER — IPRATROPIUM-ALBUTEROL 0.5-2.5 (3) MG/3ML IN SOLN: 3.0000 mL | Freq: Four times a day (QID) | RESPIRATORY_TRACT | 3 refills | Status: DC | PRN

## 2022-03-27 NOTE — Telephone Encounter (Signed)
Please advise- I see a Duoneb on her med list from 2017 that was given here in the office though.

## 2022-03-27 NOTE — Telephone Encounter (Signed)
Patient called to request her nebulizer solution to be called in to [facility] on [address]. Could not find it in current meds.

## 2022-04-01 DIAGNOSIS — C4449 Other specified malignant neoplasm of skin of scalp and neck: Secondary | ICD-10-CM | POA: Diagnosis not present

## 2022-04-01 DIAGNOSIS — J449 Chronic obstructive pulmonary disease, unspecified: Secondary | ICD-10-CM | POA: Diagnosis not present

## 2022-04-01 DIAGNOSIS — C792 Secondary malignant neoplasm of skin: Secondary | ICD-10-CM | POA: Diagnosis not present

## 2022-04-02 ENCOUNTER — Ambulatory Visit (HOSPITAL_COMMUNITY)
Admission: RE | Admit: 2022-04-02 | Discharge: 2022-04-02 | Disposition: A | Discharge status: Home / Self Care | Payer: PPO | Source: Ambulatory Visit | Attending: Family | Admitting: Family

## 2022-04-02 DIAGNOSIS — C3490 Malignant neoplasm of unspecified part of unspecified bronchus or lung: Secondary | ICD-10-CM | POA: Insufficient documentation

## 2022-04-02 DIAGNOSIS — C778 Secondary and unspecified malignant neoplasm of lymph nodes of multiple regions: Secondary | ICD-10-CM | POA: Insufficient documentation

## 2022-04-02 DIAGNOSIS — K7689 Other specified diseases of liver: Secondary | ICD-10-CM | POA: Diagnosis not present

## 2022-04-02 LAB — GLUCOSE, CAPILLARY: Glucose-Capillary: 108 mg/dL — ABNORMAL HIGH (ref 70–99)

## 2022-04-02 MED ORDER — FLUDEOXYGLUCOSE F - 18 (FDG) INJECTION
10.0000 | Freq: Once | INTRAVENOUS | Status: AC
  Administered 2022-04-02: 5.53 via INTRAVENOUS

## 2022-04-03 ENCOUNTER — Other Ambulatory Visit: Payer: Self-pay | Admitting: Family Medicine

## 2022-04-03 DIAGNOSIS — M1990 Unspecified osteoarthritis, unspecified site: Secondary | ICD-10-CM

## 2022-04-08 ENCOUNTER — Inpatient Hospital Stay (HOSPITAL_BASED_OUTPATIENT_CLINIC_OR_DEPARTMENT_OTHER): Payer: PPO | Admitting: Hematology & Oncology

## 2022-04-08 ENCOUNTER — Encounter: Payer: Self-pay | Admitting: *Deleted

## 2022-04-08 ENCOUNTER — Other Ambulatory Visit: Payer: Self-pay | Admitting: Family Medicine

## 2022-04-08 ENCOUNTER — Inpatient Hospital Stay: Payer: PPO | Attending: Hematology & Oncology

## 2022-04-08 ENCOUNTER — Encounter: Payer: Self-pay | Admitting: Hematology & Oncology

## 2022-04-08 ENCOUNTER — Other Ambulatory Visit: Payer: Self-pay

## 2022-04-08 ENCOUNTER — Other Ambulatory Visit: Payer: Self-pay | Admitting: *Deleted

## 2022-04-08 ENCOUNTER — Inpatient Hospital Stay: Payer: PPO

## 2022-04-08 VITALS — BP 116/78 | HR 90 | Temp 97.9°F | Resp 18 | Ht 59.0 in | Wt 113.2 lb

## 2022-04-08 DIAGNOSIS — D630 Anemia in neoplastic disease: Secondary | ICD-10-CM | POA: Diagnosis not present

## 2022-04-08 DIAGNOSIS — E119 Type 2 diabetes mellitus without complications: Secondary | ICD-10-CM

## 2022-04-08 DIAGNOSIS — I700 Atherosclerosis of aorta: Secondary | ICD-10-CM | POA: Diagnosis not present

## 2022-04-08 DIAGNOSIS — Z9221 Personal history of antineoplastic chemotherapy: Secondary | ICD-10-CM | POA: Insufficient documentation

## 2022-04-08 DIAGNOSIS — Z79899 Other long term (current) drug therapy: Secondary | ICD-10-CM | POA: Insufficient documentation

## 2022-04-08 DIAGNOSIS — G4730 Sleep apnea, unspecified: Secondary | ICD-10-CM | POA: Diagnosis not present

## 2022-04-08 DIAGNOSIS — R5383 Other fatigue: Secondary | ICD-10-CM | POA: Insufficient documentation

## 2022-04-08 DIAGNOSIS — R059 Cough, unspecified: Secondary | ICD-10-CM | POA: Insufficient documentation

## 2022-04-08 DIAGNOSIS — Z9181 History of falling: Secondary | ICD-10-CM | POA: Diagnosis not present

## 2022-04-08 DIAGNOSIS — I2699 Other pulmonary embolism without acute cor pulmonale: Secondary | ICD-10-CM | POA: Diagnosis not present

## 2022-04-08 DIAGNOSIS — Z86711 Personal history of pulmonary embolism: Secondary | ICD-10-CM | POA: Insufficient documentation

## 2022-04-08 DIAGNOSIS — C3412 Malignant neoplasm of upper lobe, left bronchus or lung: Secondary | ICD-10-CM

## 2022-04-08 DIAGNOSIS — J700 Acute pulmonary manifestations due to radiation: Secondary | ICD-10-CM | POA: Diagnosis not present

## 2022-04-08 DIAGNOSIS — J181 Lobar pneumonia, unspecified organism: Secondary | ICD-10-CM | POA: Diagnosis not present

## 2022-04-08 DIAGNOSIS — Z923 Personal history of irradiation: Secondary | ICD-10-CM | POA: Insufficient documentation

## 2022-04-08 DIAGNOSIS — Z9981 Dependence on supplemental oxygen: Secondary | ICD-10-CM | POA: Diagnosis not present

## 2022-04-08 DIAGNOSIS — R0602 Shortness of breath: Secondary | ICD-10-CM | POA: Diagnosis not present

## 2022-04-08 DIAGNOSIS — Z95828 Presence of other vascular implants and grafts: Secondary | ICD-10-CM

## 2022-04-08 DIAGNOSIS — Z7901 Long term (current) use of anticoagulants: Secondary | ICD-10-CM | POA: Diagnosis not present

## 2022-04-08 DIAGNOSIS — I083 Combined rheumatic disorders of mitral, aortic and tricuspid valves: Secondary | ICD-10-CM | POA: Diagnosis not present

## 2022-04-08 DIAGNOSIS — C7951 Secondary malignant neoplasm of bone: Secondary | ICD-10-CM | POA: Diagnosis not present

## 2022-04-08 DIAGNOSIS — R590 Localized enlarged lymph nodes: Secondary | ICD-10-CM | POA: Insufficient documentation

## 2022-04-08 DIAGNOSIS — C787 Secondary malignant neoplasm of liver and intrahepatic bile duct: Secondary | ICD-10-CM | POA: Diagnosis not present

## 2022-04-08 DIAGNOSIS — E032 Hypothyroidism due to medicaments and other exogenous substances: Secondary | ICD-10-CM

## 2022-04-08 DIAGNOSIS — I2693 Single subsegmental pulmonary embolism without acute cor pulmonale: Secondary | ICD-10-CM | POA: Insufficient documentation

## 2022-04-08 DIAGNOSIS — Z792 Long term (current) use of antibiotics: Secondary | ICD-10-CM | POA: Diagnosis not present

## 2022-04-08 DIAGNOSIS — Z794 Long term (current) use of insulin: Secondary | ICD-10-CM | POA: Diagnosis not present

## 2022-04-08 DIAGNOSIS — Z981 Arthrodesis status: Secondary | ICD-10-CM | POA: Diagnosis not present

## 2022-04-08 DIAGNOSIS — Z791 Long term (current) use of non-steroidal anti-inflammatories (NSAID): Secondary | ICD-10-CM | POA: Diagnosis not present

## 2022-04-08 DIAGNOSIS — E039 Hypothyroidism, unspecified: Secondary | ICD-10-CM | POA: Diagnosis not present

## 2022-04-08 DIAGNOSIS — Z7985 Long-term (current) use of injectable non-insulin antidiabetic drugs: Secondary | ICD-10-CM | POA: Diagnosis not present

## 2022-04-08 DIAGNOSIS — R0683 Snoring: Secondary | ICD-10-CM | POA: Diagnosis not present

## 2022-04-08 DIAGNOSIS — Z7984 Long term (current) use of oral hypoglycemic drugs: Secondary | ICD-10-CM | POA: Diagnosis not present

## 2022-04-08 DIAGNOSIS — C7989 Secondary malignant neoplasm of other specified sites: Secondary | ICD-10-CM | POA: Diagnosis not present

## 2022-04-08 DIAGNOSIS — R002 Palpitations: Secondary | ICD-10-CM | POA: Diagnosis not present

## 2022-04-08 DIAGNOSIS — Z7952 Long term (current) use of systemic steroids: Secondary | ICD-10-CM | POA: Diagnosis not present

## 2022-04-08 DIAGNOSIS — I3139 Other pericardial effusion (noninflammatory): Secondary | ICD-10-CM | POA: Diagnosis not present

## 2022-04-08 DIAGNOSIS — C3492 Malignant neoplasm of unspecified part of left bronchus or lung: Secondary | ICD-10-CM | POA: Diagnosis not present

## 2022-04-08 DIAGNOSIS — J9601 Acute respiratory failure with hypoxia: Secondary | ICD-10-CM | POA: Diagnosis not present

## 2022-04-08 LAB — CMP (CANCER CENTER ONLY)
ALT: 11 U/L (ref 0–44)
AST: 12 U/L — ABNORMAL LOW (ref 15–41)
Albumin: 3.8 g/dL (ref 3.5–5.0)
Alkaline Phosphatase: 49 U/L (ref 38–126)
Anion gap: 8 (ref 5–15)
BUN: 13 mg/dL (ref 8–23)
CO2: 29 mmol/L (ref 22–32)
Calcium: 9.2 mg/dL (ref 8.9–10.3)
Chloride: 99 mmol/L (ref 98–111)
Creatinine: 0.59 mg/dL (ref 0.44–1.00)
GFR, Estimated: >60 mL/min (ref >60)
Glucose, Bld: 203 mg/dL — ABNORMAL HIGH (ref 70–99)
Potassium: 4.2 mmol/L (ref 3.5–5.1)
Sodium: 136 mmol/L (ref 135–145)
Total Bilirubin: 0.3 mg/dL (ref 0.3–1.2)
Total Protein: 7.2 g/dL (ref 6.5–8.1)

## 2022-04-08 LAB — CBC WITH DIFFERENTIAL (CANCER CENTER ONLY)
Abs Immature Granulocytes: 0.02 10*3/uL (ref 0.00–0.07)
Basophils Absolute: 0 10*3/uL (ref 0.0–0.1)
Basophils Relative: 0 %
Eosinophils Absolute: 0.2 10*3/uL (ref 0.0–0.5)
Eosinophils Relative: 3 %
HCT: 34.7 % — ABNORMAL LOW (ref 36.0–46.0)
Hemoglobin: 11 g/dL — ABNORMAL LOW (ref 12.0–15.0)
Immature Granulocytes: 0 %
Lymphocytes Relative: 18 %
Lymphs Abs: 0.9 10*3/uL (ref 0.7–4.0)
MCH: 29.9 pg (ref 26.0–34.0)
MCHC: 31.7 g/dL (ref 30.0–36.0)
MCV: 94.3 fL (ref 80.0–100.0)
Monocytes Absolute: 0.7 10*3/uL (ref 0.1–1.0)
Monocytes Relative: 13 %
Neutro Abs: 3.5 10*3/uL (ref 1.7–7.7)
Neutrophils Relative %: 66 %
Platelet Count: 209 10*3/uL (ref 150–400)
RBC: 3.68 MIL/uL — ABNORMAL LOW (ref 3.87–5.11)
RDW: 13.7 % (ref 11.5–15.5)
WBC Count: 5.3 10*3/uL (ref 4.0–10.5)
nRBC: 0 % (ref 0.0–0.2)

## 2022-04-08 LAB — LACTATE DEHYDROGENASE: LDH: 218 U/L — ABNORMAL HIGH (ref 98–192)

## 2022-04-08 LAB — PREALBUMIN: Prealbumin: 22 mg/dL (ref 18–38)

## 2022-04-08 MED ORDER — HEPARIN SOD (PORK) LOCK FLUSH 100 UNIT/ML IV SOLN
500.0000 [IU] | Freq: Once | INTRAVENOUS | Status: AC
  Administered 2022-04-08: 500 [IU] via INTRAVENOUS

## 2022-04-08 MED ORDER — SODIUM CHLORIDE 0.9% FLUSH
10.0000 mL | Freq: Once | INTRAVENOUS | Status: AC
  Administered 2022-04-08: 10 mL via INTRAVENOUS

## 2022-04-08 NOTE — Patient Instructions (Signed)

## 2022-04-08 NOTE — Progress Notes (Signed)
Hematology and Oncology Follow Up Visit  [name] [id number] Feb 04, 1951 72 y.o. 04/08/2022   Principle Diagnosis:  Small cell lung cancer -- Extensive stage Pulmonary embolism -right upper lobe segmental/subsegmental   Current Therapy:        Carbo/VP-16/Tecentriq -- s/p cycle 6 -- start on 06/11/2021 Xgeva 120 mg subcu every 3 months -- next dose on 04/2022 Radiation therapy to the chest-completed 01/06/2022 Lovenox 80 mg SQ daily --started on 03/10/2022 Lurbinectiden -- start cycle #1 on 04/15/2022   Interim History:  [name] is here today for an early follow-up.  Unfortunately, we did do a PET scan on her.  This was done on 04/02/2022.  The PET scan showed that she had clearly recurrent disease.  She had disease in the liver, abdominal lymph nodes, left adrenal gland, and bones.  Also noted was mediastinal and left hilar adenopathy.  This is a quite distressing.  She did very well with her treatments.  She completed her systemic chemotherapy back in October 2023.  She then had some radiation therapy to the chest.  She subsequently has been hospitalized.  She had pneumonia.  She is still on oxygen.  She is on Lovenox for the pulmonary embolism.  She is doing well with the Lovenox.  There is no bleeding.  She is feeling a little bit better.  She is not wearing the oxygen as much.  She does not complain of any pain.  Her appetite is okay.  I think she is gaining some weight.  She has had no headache.  She has had no dysphagia or odynophagia.  There is been no leg swelling.  She has had no bleeding.  There is been no change in bowel or bladder habits.  Overall, I would have to say that her performance status is probably ECOG 1.    Medications:  Allergies as of 04/08/2022       Reactions   Lyrica [pregabalin] Other (See Comments)   Hallucinations    Crestor [rosuvastatin Calcium] Other (See Comments)   Leg cramps        Medication List        Accurate as of April 08, 2022  4:55 PM. If you have any questions, ask your nurse or doctor.          acetaminophen 325 MG tablet Commonly known as: TYLENOL Take 1 tablet (325 mg total) by mouth every 6 (six) hours as needed for moderate pain.   albuterol 108 (90 Base) MCG/ACT inhaler Commonly known as: VENTOLIN HFA Inhale 2 puffs into the lungs every 6 (six) hours as needed for wheezing or shortness of breath.   ALPRAZolam 0.5 MG tablet Commonly known as: XANAX TAKE ONE TABLET BY MOUTH THREE TIMES A DAY AS NEEDED What changed: reasons to take this   blood glucose meter kit and supplies Kit Use daily   enoxaparin 80 MG/0.8ML injection Commonly known as: LOVENOX Inject 0.7 mLs (70 mg total) into the skin daily.   FLUoxetine 20 MG capsule Commonly known as: PROZAC Take 1 capsule (20 mg total) by mouth daily.   Insulin Pen Needle 32G X 5 MM Misc Commonly known as: NovoTwist Use daily at Bedtime with Lantus   ipratropium-albuterol 0.5-2.5 (3) MG/3ML Soln Commonly known as: DUONEB Take 3 mLs by nebulization every 6 (six) hours as needed.   Lantus SoloStar 100 UNIT/ML Solostar Pen Generic drug: insulin glargine Inject 30 Units into the skin daily. HS   levothyroxine 125 MCG tablet Commonly  known as: Synthroid Take 1 tablet (125 mcg total) by mouth daily before breakfast.   meloxicam 15 MG tablet Commonly known as: MOBIC TAKE 1 TABLET BY MOUTH DAILY   metFORMIN 500 MG 24 hr tablet Commonly known as: GLUCOPHAGE-XR Take 1 tablet (500 mg total) by mouth in the morning and at bedtime.   omeprazole 20 MG capsule Commonly known as: PRILOSEC Take 1 capsule (20 mg total) by mouth daily. What changed:  when to take this reasons to take this   ONE TOUCH ULTRA 2 w/Device Kit Check blood sugar as directed. Dx:E11.65   OneTouch Verio Flex System w/Device Kit USE AS DIRECTED TO CHECK BLOOD SUGAR FOUR TIMES A DAY   OneTouch Verio test strip Generic drug: glucose blood USE ONE STRIP TO TEST  DAILY What changed: See the new instructions. Changed by: [name], DO   oxyCODONE-acetaminophen 7.5-325 MG tablet Commonly known as: Percocet Take 1 tablet by mouth every 6 (six) hours as needed for severe pain.   Trulicity 2.50 NL/9.7QB Sopn Generic drug: Dulaglutide Inject 0.75 mg into the skin every Saturday.   UNABLE TO FIND Please provide hair prothesis for patient undergoing chemotherapy for cancer diagnosis.   valACYclovir 1000 MG tablet Commonly known as: VALTREX TAKE ONE TABLET BY MOUTH DAILY AS NEEDED What changed:  how much to take reasons to take this   Xtampza ER 13.5 MG C12a Generic drug: oxyCODONE ER Take 13.5 mg by mouth 2 (two) times daily. What changed:  when to take this reasons to take this        Allergies:  Allergies  Allergen Reactions   Lyrica [Pregabalin] Other (See Comments)    Hallucinations    Crestor [Rosuvastatin Calcium] Other (See Comments)    Leg cramps    Past Medical History, Surgical history, Social history, and Family History were reviewed and updated.  Review of Systems: Review of Systems  Constitutional:  Positive for malaise/fatigue.  HENT: Negative.    Eyes: Negative.   Respiratory:  Positive for cough and shortness of breath.   Cardiovascular:  Positive for palpitations.  Gastrointestinal: Negative.   Genitourinary: Negative.   Musculoskeletal: Negative.   Skin: Negative.   Neurological: Negative.   Endo/Heme/Allergies: Negative.   Psychiatric/Behavioral: Negative.       Physical Exam:  height is _0  (1.499 m) and weight is 113 lb 3.2 oz (51.3 kg). Her oral temperature is 97.9 F (36.6 C). Her blood pressure is 116/78 and her pulse is 90. Her respiration is 18 and oxygen saturation is 100%.   Wt Readings from Last 3 Encounters:  04/08/22 113 lb 3.2 oz (51.3 kg)  04/02/22 111 lb (50.3 kg)  03/26/22 118 lb 12.8 oz (53.9 kg)   Physical Exam Vitals reviewed.  HENT:     Head: Normocephalic  and atraumatic.  Eyes:     Pupils: Pupils are equal, round, and reactive to light.  Cardiovascular:     Rate and Rhythm: Normal rate and regular rhythm.     Heart sounds: Normal heart sounds.  Pulmonary:     Effort: Pulmonary effort is normal.     Breath sounds: Normal breath sounds.  Abdominal:     General: Bowel sounds are normal.     Palpations: Abdomen is soft.  Musculoskeletal:        General: No tenderness or deformity. Normal range of motion.     Cervical back: Normal range of motion.  Lymphadenopathy:     Cervical: No cervical adenopathy.  Skin:    General: Skin is warm and dry.     Findings: No erythema or rash.  Neurological:     Mental Status: She is alert and oriented to person, place, and time.  Psychiatric:        Behavior: Behavior normal.        Thought Content: Thought content normal.        Judgment: Judgment normal.     Lab Results  Component Value Date   WBC 5.3 04/08/2022   HGB 11.0 (L) 04/08/2022   HCT 34.7 (L) 04/08/2022   MCV 94.3 04/08/2022   PLT 209 04/08/2022   Lab Results  Component Value Date   FERRITIN 1,198 (H) 03/05/2022   IRON 90 03/05/2022   TIBC 239 (L) 03/05/2022   UIBC 149 03/05/2022   IRONPCTSAT 38 (H) 03/05/2022   Lab Results  Component Value Date   RETICCTPCT 3.6 (H) 03/05/2022   RBC 3.68 (L) 04/08/2022   No results found for: "KPAFRELGTCHN", "LAMBDASER", "KAPLAMBRATIO" No results found for: "IGGSERUM", "IGA", "IGMSERUM" No results found for: "TOTALPROTELP", "ALBUMINELP", "A1GS", "A2GS", "BETS", "BETA2SER", "GAMS", "MSPIKE", "SPEI"   Chemistry      Component Value Date/Time   NA 136 04/08/2022 1536   K 4.2 04/08/2022 1536   CL 99 04/08/2022 1536   CO2 29 04/08/2022 1536   BUN 13 04/08/2022 1536   CREATININE 0.59 04/08/2022 1536   CREATININE 0.88 05/08/2021 1126      Component Value Date/Time   CALCIUM 9.2 04/08/2022 1536   ALKPHOS 49 04/08/2022 1536   AST 12 (L) 04/08/2022 1536   ALT 11 04/08/2022 1536    BILITOT 0.3 04/08/2022 1536       Impression and Plan: Ms. Wordell is a very pleasant 72 yo caucasian female with extensive stage small cell lung cancer.  She has had chemotherapy and immunotherapy.  She then had radiation therapy for residual disease.  Unfortunately, she was hospitalized with a pulmonary embolism.  She had radiation pneumonitis.  She is on oxygen.  She is on Lovenox.  Again, this recurrence has been so quick.  I worry that we are looking at disease that might be somewhat resistant to therapy.  I think the 1 option that we have for her would be lurbinectedin.  I think she could tolerate this.  I told she and her husband that I think that chance of lurbinectedin to work is probably can be about 30%.  I told him that if it did work, then we might be able to get her 1 year.  If it did not work, then I doubt that she will be with [place] in 3 to 4 months.  She understands this.  I did go over some of the side effects.  I do not think she will lose her hair.  She may need to be transfused depending on her blood counts.  Again, I just hate this for her.  I know that she is trying her best.  She has done everything we have asked her to do.  We will try to get started next week.  I will make sure that she gets her Delton See also.  I would like to get an MRI of the brain to make sure that there is no CNS metastasis.  Somehow, I would not be surprised if she had some.  We probably give her 3 cycles and then repeat her PET scan.     [name], MD 1/3/20244:55 PM

## 2022-04-08 NOTE — Progress Notes (Signed)
DISCONTINUE ON PATHWAY REGIMEN - Small Cell Lung     Cycles 1 through 4, every 21 days:     Atezolizumab      Carboplatin      Etoposide    Cycles 5 and beyond, every 21 days:     Atezolizumab   **Always confirm dose/schedule in your pharmacy ordering system**  REASON: Disease Progression PRIOR TREATMENT: [id number]: Atezolizumab 1,200 mg D1 + Carboplatin AUC=5 D1 + Etoposide 100 mg/m2 D1-3 q21 Days x 4 Cycles, Followed by Atezolizumab 1,200 mg q21 Days Until Progression or Unacceptable Toxicity TREATMENT RESPONSE: Partial Response (PR)  START ON PATHWAY REGIMEN - Small Cell Lung     A cycle is every 21 days:     Lurbinectedin   **Always confirm dose/schedule in your pharmacy ordering system**  Patient Characteristics: Relapsed or Progressive Disease, Second Line, Relapse ? 6 Months Therapeutic Status: Relapsed or Progressive Disease Line of Therapy: Second Line Time to Relapse: Relapse ? 6 Months Intent of Therapy: Non-Curative / Palliative Intent, Discussed with Patient

## 2022-04-08 NOTE — Progress Notes (Signed)
Reviewed PET results which are concerning for progression of disease. Spoke with Dr Marin Olp who wants to see patient later today. Message sent to scheduling and appointment made.   Oncology Nurse Navigator Documentation     04/08/2022    8:15 AM  Oncology Nurse Navigator Flowsheets  Navigator Follow Up Date: 04/08/2022  Navigator Follow Up Reason: Follow-up Appointment  Navigator Location [facility]  Navigator Encounter Type Scan Review  Patient Visit Type MedOnc  Treatment Phase Active Tx  Barriers/Navigation Needs Coordination of Care;Education  Interventions Coordination of Care  Acuity Level 2-Minimal Needs (1-2 Barriers Identified)  Coordination of Care Appts  Support Groups/Services Friends and Family  Time Spent with Patient 15

## 2022-04-09 ENCOUNTER — Encounter: Payer: Self-pay | Admitting: *Deleted

## 2022-04-09 ENCOUNTER — Other Ambulatory Visit: Payer: Self-pay

## 2022-04-09 ENCOUNTER — Ambulatory Visit (HOSPITAL_COMMUNITY)
Admission: RE | Admit: 2022-04-09 | Discharge: 2022-04-09 | Disposition: A | Discharge status: Home / Self Care | Payer: PPO | Source: Ambulatory Visit | Attending: Hematology & Oncology | Admitting: Hematology & Oncology

## 2022-04-09 DIAGNOSIS — G9389 Other specified disorders of brain: Secondary | ICD-10-CM | POA: Diagnosis not present

## 2022-04-09 DIAGNOSIS — C3412 Malignant neoplasm of upper lobe, left bronchus or lung: Secondary | ICD-10-CM | POA: Insufficient documentation

## 2022-04-09 MED ORDER — LIDOCAINE-PRILOCAINE 2.5-2.5 % EX CREA: 1.0000 | TOPICAL_CREAM | CUTANEOUS | 0 refills | Status: DC | PRN

## 2022-04-09 MED ORDER — GADOBUTROL 1 MMOL/ML IV SOLN
5.0000 mL | Freq: Once | INTRAVENOUS | Status: AC | PRN
  Administered 2022-04-09: 5 mL via INTRAVENOUS

## 2022-04-09 NOTE — Progress Notes (Signed)
Lidocaine-Prilocaine 2.5% approved by plan.  No expiration given.

## 2022-04-09 NOTE — Progress Notes (Signed)
Patient was seen late yesterday for discussion regarding her PET scan which demonstrated progression of disease. She will begin on a new treatment regimen with Lurbinectedin. She will need an MRI brain to complete restaging.   New treatment to start 04/15/22.   Oncology Nurse Navigator Documentation     04/09/2022   10:30 AM  Oncology Nurse Navigator Flowsheets  Confirmed Diagnosis Date 04/07/2022  Diagnosis Status Recurrent  Navigator Follow Up Date: 04/10/2022  Navigator Follow Up Reason: Scan Review  Navigator Location [facility]  Navigator Encounter Type Appt/Treatment Plan Review  Patient Visit Type MedOnc  Treatment Phase Active Tx  Barriers/Navigation Needs Coordination of Care;Education  Interventions None Required  Acuity Level 2-Minimal Needs (1-2 Barriers Identified)  Support Groups/Services Friends and Family  Time Spent with Patient 15

## 2022-04-10 ENCOUNTER — Other Ambulatory Visit (HOSPITAL_COMMUNITY): Payer: Self-pay

## 2022-04-10 ENCOUNTER — Encounter: Payer: Self-pay | Admitting: Hematology & Oncology

## 2022-04-10 ENCOUNTER — Other Ambulatory Visit: Payer: Self-pay

## 2022-04-10 ENCOUNTER — Encounter: Payer: Self-pay | Admitting: *Deleted

## 2022-04-10 NOTE — Progress Notes (Signed)
Reviewed MRI which shows new brain lesions compatible with mets. Dr Marin Olp has spoken to, and placed a referral to radiation oncology. Patient is scheduled to start new treatment next week.   Oncology Nurse Navigator Documentation     04/10/2022   11:15 AM  Oncology Nurse Navigator Flowsheets  Navigator Follow Up Date: 04/15/2022  Navigator Follow Up Reason: Chemotherapy  Navigator Location [facility]  Navigator Encounter Type Scan Review  Patient Visit Type MedOnc  Treatment Phase Active Tx  Barriers/Navigation Needs Coordination of Care;Education  Interventions Coordination of Care  Acuity Level 2-Minimal Needs (1-2 Barriers Identified)  Coordination of Care Other  Support Groups/Services Friends and Family  Time Spent with Patient 15

## 2022-04-12 ENCOUNTER — Other Ambulatory Visit: Payer: Self-pay

## 2022-04-13 NOTE — Progress Notes (Signed)
Histology and Location of Primary Cancer:  Small cell lung cancer, left upper lobe  Location(s) of Symptomatic tumor(s):  MRI Brain w/ & w/o Contrast  04/09/2022 --IMPRESSION: Interval development of 3 x 7 mm enhancing lesion right posterior cerebellum compatible with metastatic disease. No other enhancing lesions in the brain. New enhancing lesions in the calvarium compatible with metastatic disease  PET Scan  04/02/2022 --IMPRESSION: Post treatment scarring in the parahilar lower left lung identified. There is hypermetabolism in the posterior paraspinal component of the scarring. Recurrent disease is not excluded. Interval development of hypermetabolic nodal disease in the mediastinum and left hilum. 1.5 cm hypermetabolic soft tissue nodule in the deep posterior left costophrenic sulcus, consistent with metastatic disease. New hypermetabolic disease in the liver, upper midline abdomen, left adrenal gland, and bones. Scattered lytic lesions with sclerotic borders seen on the previous study persist today without hypermetabolism. Low level FDG accumulation in the distal esophagus may be related to esophagitis. Aortic Atherosclerosis (ICD10-I70.0).  Past/Anticipated chemotherapy by medical oncology, if any:  Under care of Dr. Arlan Organ 04/08/2022 --Current Therapy:        Carbo/VP-16/Tecentriq -- s/p cycle 6 -- start on 06/11/2021 Xgeva 120 mg subcu every 3 months -- next dose on 04/2022 Radiation therapy to the chest-completed 01/06/2022 Lovenox 80 mg SQ daily --started on 03/10/2022 Lurbinectiden -- start cycle #1 on 04/15/2022 --Impression and Plan: She has had chemotherapy and immunotherapy.  Unfortunately, she was hospitalized with a pulmonary embolism.   She had radiation pneumonitis.  She is on oxygen.  She is on Lovenox. I think the 1 option that we have for her would be lurbinectedin.   I think she could tolerate this. I told she and her husband that I think that chance  of lurbinectedin to work is probably can be about 30%.   I told him that if it did work, then we might be able to get her 1 year.   If it did not work, then I doubt that she will be with [place] in 3 to 4 months.  She understands this. We will try to get started next week.  I will make sure that she gets her Rivka Barbara also. I would like to get an MRI of the brain to make sure that there is no CNS metastasis. Somehow, I would not be surprised if she had some. We probably give her 3 cycles and then repeat her PET scan  Pain on a scale of 0-10 is: Reports lower back (manages with prescription pain medication)   Dose of Decadron, if applicable: 8mg  daily for 2 days after her chemotherapy treatments  Recent neurologic symptoms, if any:  Seizures: Denies Headaches: Denies Nausea: Denies Dizziness/ataxia: Reports feeling off balance every time she stands and goes to ambulate. Started this morning Difficulty with hand coordination: Denies Focal numbness/weakness: Denies Visual deficits/changes: Denies Confusion/Memory deficits: Denies  If Spine Met(s), symptoms, if any, include: Bowel/Bladder retention or incontinence (please describe): Denies any changes in her urinary or bowel habits. Deals with chronic constipation Numbness or weakness in extremities (please describe): Denies  Ambulatory status? Walker? Wheelchair?: Currently able to ambulate without assistance, but with her new balance issues she is concerned about falling  SAFETY ISSUES: Prior radiation? Yes: 11/27/2021 through 01/08/2022 [facility] [employer] Total Dose (Gy) Dose per Fx (Gy) Completed Fx Beam Energies  Lung, Left: Lung_L 3D 60/60 2 30/30 6X   Pacemaker/ICD? No Possible current pregnancy? No--hysterectomy Is the patient on methotrexate? No  Additional Complaints / other details:  Wears 2L-[place] chronically

## 2022-04-15 ENCOUNTER — Inpatient Hospital Stay: Payer: PPO

## 2022-04-15 ENCOUNTER — Other Ambulatory Visit: Payer: Self-pay | Admitting: *Deleted

## 2022-04-15 ENCOUNTER — Telehealth: Payer: Self-pay | Admitting: Family Medicine

## 2022-04-15 ENCOUNTER — Encounter: Payer: Self-pay | Admitting: *Deleted

## 2022-04-15 VITALS — BP 118/68 | HR 89 | Temp 98.0°F | Resp 17

## 2022-04-15 DIAGNOSIS — C3490 Malignant neoplasm of unspecified part of unspecified bronchus or lung: Secondary | ICD-10-CM

## 2022-04-15 DIAGNOSIS — C778 Secondary and unspecified malignant neoplasm of lymph nodes of multiple regions: Secondary | ICD-10-CM

## 2022-04-15 DIAGNOSIS — C3412 Malignant neoplasm of upper lobe, left bronchus or lung: Secondary | ICD-10-CM

## 2022-04-15 LAB — CMP (CANCER CENTER ONLY)
ALT: 14 U/L (ref 0–44)
AST: 23 U/L (ref 15–41)
Albumin: 3.3 g/dL — ABNORMAL LOW (ref 3.5–5.0)
Alkaline Phosphatase: 53 U/L (ref 38–126)
Anion gap: 7 (ref 5–15)
BUN: 11 mg/dL (ref 8–23)
CO2: 27 mmol/L (ref 22–32)
Calcium: 9.3 mg/dL (ref 8.9–10.3)
Chloride: 100 mmol/L (ref 98–111)
Creatinine: 0.59 mg/dL (ref 0.44–1.00)
GFR, Estimated: >60 mL/min (ref >60)
Glucose, Bld: 116 mg/dL — ABNORMAL HIGH (ref 70–99)
Potassium: 4.2 mmol/L (ref 3.5–5.1)
Sodium: 134 mmol/L — ABNORMAL LOW (ref 135–145)
Total Bilirubin: 0.2 mg/dL — ABNORMAL LOW (ref 0.3–1.2)
Total Protein: 7 g/dL (ref 6.5–8.1)

## 2022-04-15 LAB — CBC WITH DIFFERENTIAL (CANCER CENTER ONLY)
Abs Immature Granulocytes: 0.02 10*3/uL (ref 0.00–0.07)
Basophils Absolute: 0 10*3/uL (ref 0.0–0.1)
Basophils Relative: 0 %
Eosinophils Absolute: 0.2 10*3/uL (ref 0.0–0.5)
Eosinophils Relative: 2 %
HCT: 33.7 % — ABNORMAL LOW (ref 36.0–46.0)
Hemoglobin: 11 g/dL — ABNORMAL LOW (ref 12.0–15.0)
Immature Granulocytes: 0 %
Lymphocytes Relative: 12 %
Lymphs Abs: 0.7 10*3/uL (ref 0.7–4.0)
MCH: 30.6 pg (ref 26.0–34.0)
MCHC: 32.6 g/dL (ref 30.0–36.0)
MCV: 93.6 fL (ref 80.0–100.0)
Monocytes Absolute: 0.7 10*3/uL (ref 0.1–1.0)
Monocytes Relative: 11 %
Neutro Abs: 4.5 10*3/uL (ref 1.7–7.7)
Neutrophils Relative %: 75 %
Platelet Count: 194 10*3/uL (ref 150–400)
RBC: 3.6 MIL/uL — ABNORMAL LOW (ref 3.87–5.11)
RDW: 13.6 % (ref 11.5–15.5)
WBC Count: 6.1 10*3/uL (ref 4.0–10.5)
nRBC: 0 % (ref 0.0–0.2)

## 2022-04-15 LAB — CK: Total CK: 33 U/L — ABNORMAL LOW (ref 38–234)

## 2022-04-15 MED ORDER — DEXAMETHASONE 4 MG PO TABS: 8.0000 mg | ORAL_TABLET | Freq: Every day | ORAL | 1 refills | Status: DC

## 2022-04-15 MED ORDER — SODIUM CHLORIDE 0.9 % IV SOLN
4.0000 mg | Freq: Once | INTRAVENOUS | Status: AC
  Administered 2022-04-15: 4 mg via INTRAVENOUS
  Filled 2022-04-15: qty 8

## 2022-04-15 MED ORDER — OXYCODONE-ACETAMINOPHEN 7.5-325 MG PO TABS: 1.0000 | ORAL_TABLET | Freq: Four times a day (QID) | ORAL | 0 refills | Status: DC | PRN

## 2022-04-15 MED ORDER — PALONOSETRON HCL INJECTION 0.25 MG/5ML
0.2500 mg | Freq: Once | INTRAVENOUS | Status: AC
  Administered 2022-04-15: 0.25 mg via INTRAVENOUS
  Filled 2022-04-15: qty 5

## 2022-04-15 MED ORDER — SODIUM CHLORIDE 0.9% FLUSH
10.0000 mL | INTRAVENOUS | Status: DC | PRN
  Administered 2022-04-15: 10 mL

## 2022-04-15 MED ORDER — DENOSUMAB 120 MG/1.7ML ~~LOC~~ SOLN
120.0000 mg | Freq: Once | SUBCUTANEOUS | Status: AC
  Administered 2022-04-15: 120 mg via SUBCUTANEOUS
  Filled 2022-04-15: qty 1.7

## 2022-04-15 MED ORDER — HEPARIN SOD (PORK) LOCK FLUSH 100 UNIT/ML IV SOLN
500.0000 [IU] | Freq: Once | INTRAVENOUS | Status: AC | PRN
  Administered 2022-04-15: 500 [IU]

## 2022-04-15 MED ORDER — SODIUM CHLORIDE 0.9 % IV SOLN: Freq: Once | INTRAVENOUS | Status: AC

## 2022-04-15 MED ORDER — SODIUM CHLORIDE 0.9 % IV SOLN
10.0000 mg | Freq: Once | INTRAVENOUS | Status: AC
  Administered 2022-04-15: 10 mg via INTRAVENOUS
  Filled 2022-04-15: qty 10

## 2022-04-15 NOTE — Progress Notes (Signed)
Ok to treat using CMET from 1/3 per Dr Marin Olp. dph

## 2022-04-15 NOTE — Patient Instructions (Signed)

## 2022-04-15 NOTE — Telephone Encounter (Signed)
Beth called to provide courtesy update regarding patient. Patient refused last date of service with them. Beth stated patient received a new cancer diagnosis and was overwhelmed with appointments.

## 2022-04-15 NOTE — Patient Instructions (Signed)
[facility]  Discharge Instructions: Thank you for choosing [facility] to provide your oncology and hematology care.   If you have a lab appointment with the [facility], please go directly to the [facility] and check in at the registration area.  Wear comfortable clothing and clothing appropriate for easy access to any Portacath or PICC line.   We strive to give you quality time with your provider. You may need to reschedule your appointment if you arrive late (15 or more minutes).  Arriving late affects you and other patients whose appointments are after yours.  Also, if you miss three or more appointments without notifying the office, you may be dismissed from the clinic at the provider's discretion.      For prescription refill requests, have your pharmacy contact our office and allow 72 hours for refills to be completed.    Today you received the following chemotherapy and/or immunotherapy agents Zepzelca (Lubinectedin)       To help prevent nausea and vomiting after your treatment, we encourage you to take your nausea medication as directed.  BELOW ARE SYMPTOMS THAT SHOULD BE REPORTED IMMEDIATELY: *FEVER GREATER THAN 100.4 F (38 C) OR HIGHER *CHILLS OR SWEATING *NAUSEA AND VOMITING THAT IS NOT CONTROLLED WITH YOUR NAUSEA MEDICATION *UNUSUAL SHORTNESS OF BREATH *UNUSUAL BRUISING OR BLEEDING *URINARY PROBLEMS (pain or burning when urinating, or frequent urination) *BOWEL PROBLEMS (unusual diarrhea, constipation, pain near the anus) TENDERNESS IN MOUTH AND THROAT WITH OR WITHOUT PRESENCE OF ULCERS (sore throat, sores in mouth, or a toothache) UNUSUAL RASH, SWELLING OR PAIN  UNUSUAL VAGINAL DISCHARGE OR ITCHING   Items with * indicate a potential emergency and should be followed up as soon as possible or go to the Emergency Department if any problems should occur.  Please show the CHEMOTHERAPY ALERT CARD or IMMUNOTHERAPY ALERT CARD at  check-in to the Emergency Department and triage nurse. Should you have questions after your visit or need to cancel or reschedule your appointment, please contact [facility]  [phone] and follow the prompts.  Office hours are 8:00 a.m. to 4:30 p.m. Monday - Friday. Please note that voicemails left after 4:00 p.m. may not be returned until the following business day.  We are closed weekends and major holidays. You have access to a nurse at all times for urgent questions. Please call the main number to the clinic [phone] and follow the prompts.  For any non-urgent questions, you may also contact your provider using MyChart. We now offer e-Visits for anyone 23 and older to request care online for non-urgent symptoms. For details visit [email or web address].   Also download the MyChart app! Go to the app store, search "MyChart", open the app, select [facility], and log in with your MyChart username and password.

## 2022-04-15 NOTE — Progress Notes (Signed)
Patient here to begin new treatment regimen with Lurbinectedin. She also has her consultation with radiation oncology tomorrow to discuss treatment for her new brain mets.   Oncology Nurse Navigator Documentation     04/15/2022    9:15 AM  Oncology Nurse Navigator Flowsheets  Navigator Follow Up Date: 05/07/2022  Navigator Follow Up Reason: Follow-up Appointment;Chemotherapy  Navigator Restaurant manager, [employer] food Encounter Type Telephone  Patient Visit Type MedOnc  Treatment Phase Active Tx  Barriers/Navigation Needs Coordination of Care;Education  Interventions Psycho-Social Support  Acuity Level 2-Minimal Needs (1-2 Barriers Identified)  Support Groups/Services Friends and Family  Time Spent with Patient 15

## 2022-04-15 NOTE — Progress Notes (Signed)
Radiation Oncology         [phone] ________________________________  Outpatient Re-Consultation  Name: [name] MRN: [id number]  Date: 04/16/2022  DOB: 02-Apr-1951  EY:CXKGY Chase, Alferd Apa, DO  Ennever, Rudell Cobb, MD   REFERRING PHYSICIAN: [name], MD  DIAGNOSIS: The primary encounter diagnosis was Small cell lung cancer, left upper lobe ([place]). 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 Now with widespread recurrent disease (PET 04/02/22)   Cancer Staging  Small cell lung cancer, left upper lobe [facility]) Staging form: Lung, AJCC 8th Edition - Clinical stage from 06/06/2021: Stage IVB (cT2b, cN3, pM1c) - Signed by [name], MD on 06/06/2021  Interval Since Last Radiation:  3 months and 6 days   Intent: Curative  Radiation Treatment Dates: 11/27/2021 through 01/08/2022 Site Technique Total Dose (Gy) Dose per Fx (Gy) Completed Fx Beam Energies  Lung, Left: Lung_L 3D 60/60 2 30/30 6X   HISTORY OF PRESENT ILLNESS::[name] is a 72 y.o. female who is accompanied by her supportive husband. She is seen as a courtesy of Dr. Marin Olp for an opinion concerning further radiation therapy as part of management for her recent recurrence of metastatic lung cancer. I last met with the patient on 02/09/22 for follow-up of radiation to the left lung. She was doing well and NED on examination at that time.   In the interval since her last visit, the patient presented to an urgent care on 02/25/22 with hypoxia. On UC exam, she was found to be febrile and hypoxic and was advised to present to the ED for further management. CTA of the chest performed on admission was positive for PE involving the right upper lung  distal segmental and subsegmental vessels. There was also concern for underlying airspace disease suspicious for multifocal pneumonia. CT also showed prominent mediastinal and abdominal lymph nodes (likely metastatic), and multiple known bone metastases. MRI of the liver performed on 12/03 showed hemosiderosis as well as 1.3 cm left adrenal mass concerning for adrenal metastasis. RUQ abdominal ultrasound also performed showed a mass-like hypoechoic region is identified in the right hepatic lobe measuring up to 3.2 cm Hospital course included supplemental O2, anticoagulation which was transitioned to Lovenox per oncology recommendations, antibiotics, and steroid taper. The patient was discharged on 03/12/22 with home oxygen given persistent exertional hypoxia.   Following discharge, the patient had a restaging PET scan performed on 04/02/22 which showed: post treatment scarring in the parahilar lower left lung with hypermetabolism in the posterior paraspinal component of the scarring (recurrent disease not excluded); interval development of hypermetabolic nodal disease in the mediastinum and left hilum; a 1.5 cm hypermetabolic soft tissue nodule in the deep posterior left costophrenic sulcus consistent with metastatic disease; new hypermetabolic disease in the liver, upper midline abdomen and left adrenal gland; and new bony disease in the cervical spine, right mandible, right humerus, right clavicle, bilateral ribs, thoracic spine, lumbar spine, and bony pelvis. PET also demonstrated the previously seen scattered lytic lesions with sclerotic borders as non-hypermetabolic, and low-level FDG accumulation in the distal esophagus possibly related to esophagitis.     For her recurrent disease, Dr. Marin Olp has recommended lurbinectedin (Zepzelca) x3 cycles. Dr. Marin Olp has informed the patient and her husband that  if lurbinectedin works, she may possibly be with [place] for one more year. If treatment does not work, she  may not be with [place] in 3 to 4 months. The patient understands this and is willing to pursue treatment. She received her first infusion yesterday.   Pertinent imaging thus far includes an MRI of the brain with and without contrast on 04/09/21 which shows interval development of 3 x 7 mm enhancing lesion in the right posterior cerebellum compatible with metastatic disease, and new enhancing lesions in the calvarium compatible with metastatic disease.  Today she reports an episode of dizziness this morning that she attributes to her chemotherapy. She otherwise denies any troubles with gait, numbness, tingling, vision changes.  She reports some mild headaches.   PAST MEDICAL HISTORY:  Past Medical History:  Diagnosis Date   Arthritis    Cancer ([place])    Diabetes mellitus    Esophagitis    Goals of care, counseling/discussion 05/23/2021   Hiatal hernia    History of radiation therapy    Left Lung- 11/27/21-01/08/22- Dr. 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[place]) 06/06/2021   Small cell lung cancer, left upper lobe ([place]) 06/06/2021   Thyroid disease    TMJ (sprain of temporomandibular joint)     PAST SURGICAL HISTORY: Past Surgical History:  Procedure Laterality Date   ABDOMINAL HYSTERECTOMY     BACK SURGERY     x3   BACK SURGERY     x 5 , 2 neck surgeries, 2 lower back surgeries   BRONCHIAL BIOPSY  06/02/2021   Procedure: BRONCHIAL BIOPSIES;  Surgeon: Collene Gobble, MD;  Location: [place];  Service: Pulmonary;;   BRONCHIAL BRUSHINGS  06/02/2021   Procedure: BRONCHIAL BRUSHINGS;  Surgeon: Collene Gobble, MD;  Location[facility] ENDOSCOPY;  Service: Pulmonary;;   BRONCHIAL NEEDLE ASPIRATION BIOPSY  06/02/2021   Procedure: BRONCHIAL NEEDLE ASPIRATION  BIOPSIES;  Surgeon: Collene Gobble, MD;  Location: [facility] ENDOSCOPY;  Service: Pulmonary;;   CHOLECYSTECTOMY     COLONOSCOPY     ERCP     ESOPHAGOGASTRODUODENOSCOPY     IR IMAGING GUIDED PORT INSERTION  06/13/2021   THYROID SURGERY     TONSILECTOMY, ADENOIDECTOMY, BILATERAL MYRINGOTOMY AND TUBES     VIDEO BRONCHOSCOPY WITH ENDOBRONCHIAL ULTRASOUND Left 06/02/2021   Procedure: VIDEO BRONCHOSCOPY WITH ENDOBRONCHIAL ULTRASOUND;  Surgeon: Collene Gobble, MD;  [facility]COPY;  Service: Pulmonary;  Laterality: Left;    FAMILY HISTORY:  Family History  Problem Relation Age of Onset   Stroke Mother    Alcohol abuse Mother    Cancer Brother        leukemia   Stroke Maternal Grandmother    Heart disease Maternal Grandfather    Cancer Daughter        breast cancer   Breast cancer Daughter    Breast cancer Cousin    Colon cancer Neg Hx    Esophageal cancer Neg Hx    Rectal cancer Neg Hx    Stomach cancer Neg Hx     SOCIAL HISTORY:  Social History   Tobacco Use   Smoking status: Former    Years: 48.00    Types: Cigarettes  Quit date: 02/01/2017    Years since quitting: 5.2   Smokeless tobacco: Never  Vaping Use   Vaping Use: Never used  Substance Use Topics   Alcohol use: Never   Drug use: No    ALLERGIES:  Allergies  Allergen Reactions   Lyrica [Pregabalin] Other (See Comments)    Hallucinations    Crestor [Rosuvastatin Calcium] Other (See Comments)    Leg cramps    MEDICATIONS:  Current Outpatient Medications  Medication Sig Dispense Refill   meclizine (ANTIVERT) 12.5 MG tablet Take 1 tablet (12.5 mg total) by mouth every 6 (six) hours as needed for dizziness. 30 tablet 0   acetaminophen (TYLENOL) 325 MG tablet Take 1 tablet (325 mg total) by mouth every 6 (six) hours as needed for moderate pain.     albuterol (VENTOLIN HFA) 108 (90 Base) MCG/ACT inhaler Inhale 2 puffs into the lungs every 6 (six) hours as needed for wheezing or shortness of breath. 8 g 2    ALPRAZolam (XANAX) 0.5 MG tablet TAKE ONE TABLET BY MOUTH THREE TIMES A DAY AS NEEDED (Patient taking differently: Take 0.5 mg by mouth 3 (three) times daily as needed for anxiety.) 90 tablet 0   blood glucose meter kit and supplies KIT Use daily 1 each 0   Blood Glucose Monitoring Suppl (ONE TOUCH ULTRA 2) w/Device KIT Check blood sugar as directed. Dx:E11.65 1 kit 0   Blood Glucose Monitoring Suppl (ONETOUCH VERIO FLEX SYSTEM) w/Device KIT USE AS DIRECTED TO CHECK BLOOD SUGAR FOUR TIMES A DAY 1 kit 0   dexamethasone (DECADRON) 4 MG tablet Take 2 tablets (8 mg total) by mouth daily. Start the day after chemotherapy for 2 days. Take with food. 30 tablet 1   enoxaparin (LOVENOX) 80 MG/0.8ML injection Inject 0.7 mLs (70 mg total) into the skin daily. 21 mL 3   FLUoxetine (PROZAC) 20 MG capsule Take 1 capsule (20 mg total) by mouth daily. 90 capsule 3   glucose blood (ONETOUCH VERIO) test strip USE ONE STRIP TO TEST DAILY 100 strip 1   insulin glargine (LANTUS SOLOSTAR) 100 UNIT/ML Solostar Pen Inject 30 Units into the skin daily. HS     Insulin Pen Needle (NOVOTWIST) 32G X 5 MM MISC Use daily at Bedtime with Lantus 100 each 0   ipratropium-albuterol (DUONEB) 0.5-2.5 (3) MG/3ML SOLN Take 3 mLs by nebulization every 6 (six) hours as needed. 360 mL 3   levothyroxine (SYNTHROID) 125 MCG tablet Take 1 tablet (125 mcg total) by mouth daily before breakfast. 90 tablet 3   lidocaine-prilocaine (EMLA) cream Apply 1 Application topically as needed. 30 g 0   meloxicam (MOBIC) 15 MG tablet TAKE 1 TABLET BY MOUTH DAILY 30 tablet 2   metFORMIN (GLUCOPHAGE-XR) 500 MG 24 hr tablet Take 1 tablet (500 mg total) by mouth in the morning and at bedtime. 180 tablet 0   omeprazole (PRILOSEC) 20 MG capsule Take 1 capsule (20 mg total) by mouth daily. (Patient taking differently: Take 20 mg by mouth daily as needed (indigestion).) 90 capsule 3   oxyCODONE ER (XTAMPZA ER) 13.5 MG C12A Take 13.5 mg by mouth 2 (two) times daily.  (Patient taking differently: Take 13.5 mg by mouth 2 (two) times daily as needed (pain).) 60 capsule 0   oxyCODONE-acetaminophen (PERCOCET) 7.5-325 MG tablet Take 1 tablet by mouth every 6 (six) hours as needed for severe pain. 90 tablet 0   TRULICITY 0.73 XT/0.6YI SOPN Inject 0.75 mg into the skin every Saturday.  UNABLE TO FIND Please provide hair prothesis for patient undergoing chemotherapy for cancer diagnosis. 1 Units 0   valACYclovir (VALTREX) 1000 MG tablet TAKE ONE TABLET BY MOUTH DAILY AS NEEDED (Patient taking differently: Take 500 mg by mouth daily as needed (prevent outbreaks).) 30 tablet 0   Current Facility-Administered Medications  Medication Dose Route Frequency Provider Last Rate Last Admin   ipratropium-albuterol (DUONEB) 0.5-2.5 (3) MG/3ML nebulizer solution 3 mL  3 mL Nebulization Q6H Lowne Chase, Kendrick Fries R, DO       Facility-Administered Medications Ordered in Other Encounters  Medication Dose Route Frequency Provider Last Rate Last Admin   sodium chloride flush (NS) 0.9 % injection 10 mL  10 mL Intravenous PRN Celso Amy, NP   10 mL at 08/18/21 8413    REVIEW OF SYSTEMS:  See HPI above   PHYSICAL EXAM:  height is 4\' 11"  (1.499 m) and weight is 113 lb 2 oz (51.3 kg). Her temporal temperature is 97.2 F (36.2 C) (abnormal). Her blood pressure is 111/70 and her pulse is 102 (abnormal). Her respiration is 18 and oxygen saturation is 98%.   General: Alert and oriented, in no acute distress HEENT: Head is normocephalic. Horizontal nystagmus illicited with EOMs. Neck: Neck is supple, no palpable cervical or supraclavicular lymphadenopathy. Heart: Regular in rate and rhythm with no murmurs, rubs, or gallops. Chest: Clear to auscultation bilaterally, with no rhonchi, wheezes, or rales. Abdomen: Soft, nontender, nondistended, with no rigidity or guarding. Extremities: No cyanosis or edema. Lymphatics: see Neck Exam Skin: No concerning lesions. Musculoskeletal:  symmetric strength and muscle tone throughout. Neurologic: Cranial nerves II through XII are grossly intact. No obvious focalities. Speech is fluent. Coordination is intact. Psychiatric: Judgment and insight are intact. Affect is appropriate.  ECOG = 1  0 - Asymptomatic (Fully active, able to carry on all predisease activities without restriction)  1 - Symptomatic but completely ambulatory (Restricted in physically strenuous activity but ambulatory and able to carry out work of a light or sedentary nature. For example, light housework, office work)  2 - Symptomatic, <50% in bed during the day (Ambulatory and capable of all self care but unable to carry out any work activities. Up and about more than 50% of waking hours)  3 - Symptomatic, >50% in bed, but not bedbound (Capable of only limited self-care, confined to bed or chair 50% or more of waking hours)  4 - Bedbound (Completely disabled. Cannot carry on any self-care. Totally confined to bed or chair)  5 - Death   Eustace Pen MM, Creech RH, Tormey DC, et al. [phone]). "Toxicity and response criteria of the [facility]". [place] Oncol. 5 (6): 649-55  LABORATORY DATA:  Lab Results  Component Value Date   WBC 6.1 04/15/2022   HGB 11.0 (L) 04/15/2022   HCT 33.7 (L) 04/15/2022   MCV 93.6 04/15/2022   PLT 194 04/15/2022   NEUTROABS 4.5 04/15/2022   Lab Results  Component Value Date   NA 134 (L) 04/15/2022   K 4.2 04/15/2022   CL 100 04/15/2022   CO2 27 04/15/2022   GLUCOSE 116 (H) 04/15/2022   BUN 11 04/15/2022   CREATININE 0.59 04/15/2022   CALCIUM 9.3 04/15/2022      RADIOGRAPHY: MR Brain W Wo Contrast  Result Date: 04/09/2022 CLINICAL DATA:  Small cell lung cancer. Staging for metastatic disease EXAM: MRI HEAD WITHOUT AND WITH CONTRAST TECHNIQUE: Multiplanar, multiecho pulse sequences of the brain and surrounding structures were obtained without  and with intravenous contrast. CONTRAST:  53mL GADAVIST  GADOBUTROL 1 MMOL/ML IV SOLN COMPARISON:  MRI head 11/10/2021 FINDINGS: Brain: New area of enhancement in the right posterior cerebellum measuring proximally 3 x 7 mm. Mild hyperintensity on FLAIR which was not present previously. Findings compatible with metastatic disease. No other enhancing lesions in the brain. Ventricle size normal. Patchy white matter hypodensity bilaterally. No acute infarct. No intracranial hemorrhage Vascular: Normal arterial flow voids Skull and upper cervical spine: New enhancing lesions in the calvarium compatible with metastatic disease. There is an 8 mm lesion in the left frontal bone and a 10 mm lesion in the occipital bone in the midline. There may also be a third mildly enhancing lesion in the left frontal bone on axial image 107. Anterior and posterior cervical spine fusion with hardware. Sinuses/Orbits: Paranasal sinuses clear.  Negative orbit Other: None IMPRESSION: 1. Interval development of 3 x 7 mm enhancing lesion right posterior cerebellum compatible with metastatic disease. No other enhancing lesions in the brain. 2. New enhancing lesions in the calvarium compatible with metastatic disease. Electronically Signed   By: Franchot Gallo M.D.   On: 04/09/2022 16:54   NM PET Image Restag (PS) Skull Base To Thigh  Result Date: 04/07/2022 CLINICAL DATA:  Subsequent treatment strategy for small-cell lung cancer. EXAM: NUCLEAR MEDICINE PET SKULL BASE TO THIGH TECHNIQUE: 5.5 mCi F-18 FDG was injected intravenously. Full-ring PET imaging was performed from the skull base to thigh after the radiotracer. CT data was obtained and used for attenuation correction and anatomic localization. Fasting blood glucose: 108 mg/dl COMPARISON:  Chest CTA 02/25/2022.  PET-CT 11/03/2021 FINDINGS: Mediastinal blood pool activity: SUV max 2.5 Liver activity: SUV max NA NECK: No hypermetabolic lymph nodes in the neck. Incidental CT findings: None. CHEST: Right paratracheal node is progressive in the  interval measuring 13 mm short axis today compared to 10 mm short axis previously. SUV max = 14.7. Small left hilar lymph node is hypermetabolic with SUV max = 3.7. Radiation fibrosis noted in the parahilar lower left lung with hypermetabolism noted in the paraspinal component of the scarring, demonstrating SUV max = 5.6. 1.5 cm soft tissue nodule posterior to the spleen on image 96/4 is probably in the deep posterior left costophrenic sulcus. This nodule is markedly hypermetabolic with SUV max = 59. Low level FDG accumulation in the distal esophagus may be related to esophagitis. Incidental CT findings: Right Port-A-Cath tip is positioned in the right atrium. Coronary artery calcification is evident. Mild atherosclerotic calcification is noted in the wall of the thoracic aorta. ABDOMEN/PELVIS: A new 2.9 cm low-density lesion in the dome of the liver is hypermetabolic with SUV max = 8.8. A new focus of hypermetabolism is identified along the cranial aspect of the pancreatic head. There is no underlying lymph node or mass lesion evident at this location although SUV max = 11.8. A new hypermetabolic 18 mm left adrenal nodule is identified. Incidental CT findings: Gallbladder surgically absent. There is mild atherosclerotic calcification of the abdominal aorta without aneurysm. Diverticular changes noted left colon without diverticulitis. SKELETON: Multiple new hypermetabolic skeletal metastases are identified in the cervical spine, right mandible, right humerus, right clavicle, bilateral ribs, thoracic spine, lumbar spine, and bony pelvis. Index right sacral lesion demonstrates SUV max = 45. Incidental CT findings: Scattered lytic lesions with sclerotic borders seen on the previous study persist today without hypermetabolism. IMPRESSION: 1. Post treatment scarring in the parahilar lower left lung identified. There is hypermetabolism in the posterior paraspinal  component of the scarring. Recurrent disease is not  excluded. 2. Interval development of hypermetabolic nodal disease in the mediastinum and left hilum. 3. 1.5 cm hypermetabolic soft tissue nodule in the deep posterior left costophrenic sulcus, consistent with metastatic disease. 4. New hypermetabolic disease in the liver, upper midline abdomen, left adrenal gland, and bones. 5. Scattered lytic lesions with sclerotic borders seen on the previous study persist today without hypermetabolism. 6. Low level FDG accumulation in the distal esophagus may be related to esophagitis. 7.  Aortic Atherosclerosis (ICD10-I70.0). Electronically Signed   By: Misty Stanley M.D.   On: 04/07/2022 08:37      IMPRESSION: Extensive stage small cell left lung cancer with thoracic and lumbar vertebral metastases, hepatic metastasis, pancreatic mets, and nodal mets; s/p chemotherapy with resolution of hepatic mets. Now with widespread recurrent disease (PET 04/02/22)  Patient with extensive stage small cell lung cancer with new, small 3 x 7 mm enhancing brain lesion. We discussed with patient today that the standard of care for patients with extensive stage small cell lung cancer is to offer whole brain radiation therapy for brain metastases. However, given this patient's poor prognosis and small size of her tumor, SRS could be considered to decrease the risk of cognitive side effects associated with whole brain radiation therapy.  We discussed both options in depth to the patient and her husband. They would like to proceed with SRS therapy at this time with the understanding that she will require treatment MRIs for close follow-up.  PLAN: She is being scheduled for consult with Dr. Isidore Moos to discuss [facility] therapy. She knows she can call back to our office with any questions or concerns.  3T MRI has also been ordered.   35 minutes of total time was spent for this patient encounter, including preparation, face-to-face counseling with the patient and coordination of care, physical exam,  and documentation of the encounter.   ------------------------------------------------   Leona Singleton, PA   Blair Promise, PhD, MD  This document serves as a record of services personally performed by Gery Pray, MD. It was created on his behalf by Roney Mans, a trained medical scribe. The creation of this record is based on the scribe's personal observations and the provider's statements to them. This document has been checked and approved by the attending provider.

## 2022-04-16 ENCOUNTER — Ambulatory Visit
Admission: RE | Admit: 2022-04-16 | Discharge: 2022-04-16 | Disposition: A | Discharge status: Home / Self Care | Payer: PPO | Source: Ambulatory Visit | Attending: Radiation Oncology | Admitting: Radiation Oncology

## 2022-04-16 ENCOUNTER — Telehealth: Payer: Self-pay | Admitting: *Deleted

## 2022-04-16 ENCOUNTER — Other Ambulatory Visit: Payer: Self-pay

## 2022-04-16 ENCOUNTER — Other Ambulatory Visit: Payer: Self-pay | Admitting: Radiation Therapy

## 2022-04-16 ENCOUNTER — Encounter: Payer: Self-pay | Admitting: Radiation Oncology

## 2022-04-16 VITALS — BP 111/70 | HR 102 | Temp 97.2°F | Resp 18 | Ht 59.0 in | Wt 113.1 lb

## 2022-04-16 DIAGNOSIS — C3412 Malignant neoplasm of upper lobe, left bronchus or lung: Secondary | ICD-10-CM | POA: Diagnosis not present

## 2022-04-16 DIAGNOSIS — Z86010 Personal history of colonic polyps: Secondary | ICD-10-CM | POA: Insufficient documentation

## 2022-04-16 DIAGNOSIS — C7989 Secondary malignant neoplasm of other specified sites: Secondary | ICD-10-CM | POA: Insufficient documentation

## 2022-04-16 DIAGNOSIS — Z800 Family history of malignant neoplasm of digestive organs: Secondary | ICD-10-CM | POA: Insufficient documentation

## 2022-04-16 DIAGNOSIS — I10 Essential (primary) hypertension: Secondary | ICD-10-CM | POA: Insufficient documentation

## 2022-04-16 DIAGNOSIS — Z87891 Personal history of nicotine dependence: Secondary | ICD-10-CM | POA: Diagnosis not present

## 2022-04-16 DIAGNOSIS — Z791 Long term (current) use of non-steroidal anti-inflammatories (NSAID): Secondary | ICD-10-CM | POA: Diagnosis not present

## 2022-04-16 DIAGNOSIS — Z803 Family history of malignant neoplasm of breast: Secondary | ICD-10-CM | POA: Insufficient documentation

## 2022-04-16 DIAGNOSIS — Z794 Long term (current) use of insulin: Secondary | ICD-10-CM | POA: Insufficient documentation

## 2022-04-16 DIAGNOSIS — Z7952 Long term (current) use of systemic steroids: Secondary | ICD-10-CM | POA: Diagnosis not present

## 2022-04-16 DIAGNOSIS — E119 Type 2 diabetes mellitus without complications: Secondary | ICD-10-CM | POA: Insufficient documentation

## 2022-04-16 DIAGNOSIS — C787 Secondary malignant neoplasm of liver and intrahepatic bile duct: Secondary | ICD-10-CM | POA: Diagnosis not present

## 2022-04-16 DIAGNOSIS — C7931 Secondary malignant neoplasm of brain: Secondary | ICD-10-CM | POA: Insufficient documentation

## 2022-04-16 DIAGNOSIS — Z923 Personal history of irradiation: Secondary | ICD-10-CM | POA: Diagnosis not present

## 2022-04-16 DIAGNOSIS — Z79899 Other long term (current) drug therapy: Secondary | ICD-10-CM | POA: Insufficient documentation

## 2022-04-16 DIAGNOSIS — Z7985 Long-term (current) use of injectable non-insulin antidiabetic drugs: Secondary | ICD-10-CM | POA: Diagnosis not present

## 2022-04-16 DIAGNOSIS — E039 Hypothyroidism, unspecified: Secondary | ICD-10-CM | POA: Diagnosis not present

## 2022-04-16 DIAGNOSIS — C778 Secondary and unspecified malignant neoplasm of lymph nodes of multiple regions: Secondary | ICD-10-CM | POA: Insufficient documentation

## 2022-04-16 DIAGNOSIS — Z79890 Hormone replacement therapy (postmenopausal): Secondary | ICD-10-CM | POA: Insufficient documentation

## 2022-04-16 DIAGNOSIS — Z7984 Long term (current) use of oral hypoglycemic drugs: Secondary | ICD-10-CM | POA: Insufficient documentation

## 2022-04-16 MED ORDER — MECLIZINE HCL 12.5 MG PO TABS: 12.5000 mg | ORAL_TABLET | Freq: Four times a day (QID) | ORAL | 0 refills | Status: DC | PRN

## 2022-04-16 NOTE — Telephone Encounter (Signed)
Received call from pt reporting she's been dizzy, lightheaded & "stumbling around all day." Denies vision changes, unilateral weakness, nausea. Pt has known brain mets. Saw XRT today who did not advise about sx.   Dr Marin Olp spoke with Dr Sondra Come re: sx. Neither MD believes pt's brain mets are involved enough to be causative. [name], pharmacist found evidence via [facility] drug resource that listed "unsteadiness or awkwardness" as a "more common" side effect of Lurbinectedin that should "go away as the body adjusts to the medicine." Pt received first dose of this treatment yesterday, 1/10.  Per Dr Marin Olp, Antivert called to [facility]. All this information relayed to patient who verbalizes appreciation and understanding using teachback. Fall precautions reviewed. Pt does not drive. Aware we will have a better understanding if this is treatment related after next dose. Will contact the office if she does not improve with Antivert. dph

## 2022-04-16 NOTE — Progress Notes (Signed)
Order entered to access port the day of brain MRI at [place].

## 2022-04-16 NOTE — Telephone Encounter (Signed)
Call received from patient stating that when she woke up this morning, she began "stumbling all over, is lightheaded and slightly dizzy."  She states that this has continued all day and would like to know if the Zepzelca could cause these symptoms.  Informed [name] that I would speak with Dr. Marin Olp and call her back with any new orders from Dr. Marin Olp.

## 2022-04-16 NOTE — Progress Notes (Signed)
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 No other enhancing lesions in the brain. New enhancing lesions in the calvarium compatible with metastatic disease   PET Scan  04/02/2022 --IMPRESSION: Post treatment scarring in the parahilar lower left lung identified. There is hypermetabolism in the posterior paraspinal component of the scarring. Recurrent disease is not excluded. Interval development of hypermetabolic nodal disease in the mediastinum and left hilum. 1.5 cm hypermetabolic soft tissue nodule in the deep posterior left costophrenic sulcus, consistent with metastatic disease. New hypermetabolic disease in the liver, upper midline abdomen, left adrenal gland, and bones. Scattered lytic lesions with sclerotic borders seen on the previous study persist today without hypermetabolism. Low level FDG accumulation in the distal esophagus may be related to esophagitis. Aortic Atherosclerosis (ICD10-I70.0).   Past/Anticipated chemotherapy by medical oncology, if any:  Under care of Dr. Arlan Organ 04/08/2022 --Current Therapy:        Carbo/VP-16/Tecentriq -- s/p cycle 6 -- start on 06/11/2021 Xgeva 120 mg subcu every 3 months -- next dose on 04/2022 Radiation therapy to the chest-completed 01/06/2022 Lovenox 80 mg SQ daily --started on 03/10/2022 Lurbinectiden -- start cycle #1 on 04/15/2022 --Impression and Plan: She has had chemotherapy and immunotherapy.  Unfortunately, she was hospitalized with a pulmonary embolism.   She had radiation pneumonitis.  She is on oxygen.  She is on Lovenox. I think the 1 option that we have for her would be lurbinectedin.   I think she could tolerate this. I told she and her  husband that I think that chance of lurbinectedin to work is probably can be about 30%.   I told him that if it did work, then we might be able to get her 1 year.   If it did not work, then I doubt that she will be with [place] in 3 to 4 months.  She understands this. We will try to get started next week.  I will make sure that she gets her Rivka Barbara also. I would like to get an MRI of the brain to make sure that there is no CNS metastasis. Somehow, I would not be surprised if she had some. We probably give her 3 cycles and then repeat her PET scan   Pain on a scale of 0-10 is:    Dose of Decadron, if applicable: 8mg  daily for 2 days after her chemotherapy treatments  Recent neurologic symptoms, if any:  Seizures: no Headaches: yes, daily headaches Nausea: none Dizziness/ataxia: none Difficulty with hand coordination: none Focal numbness/weakness: none Visual deficits/changes: floaters  Confusion/Memory deficits: none  Painful bone metastases at present, if any: none  SAFETY ISSUES:   Pacemaker/ICD? none Possible current pregnancy? hysterectomy Is the patient on methotrexate? none  Additional Complaints / other details: Wears 2L-[place] chronically, gets chemo every three weeks, one treatment so far, wants to know most recent MRI results    Vitals:   04/24/22 0755  BP: 106/75  Pulse: 99  Resp: 18  Temp: (!) 96.7 F (35.9 C)  SpO2: 99%

## 2022-04-17 DIAGNOSIS — J9601 Acute respiratory failure with hypoxia: Secondary | ICD-10-CM | POA: Diagnosis not present

## 2022-04-22 ENCOUNTER — Other Ambulatory Visit: Payer: PPO

## 2022-04-22 ENCOUNTER — Ambulatory Visit: Payer: PPO | Admitting: Hematology & Oncology

## 2022-04-22 ENCOUNTER — Ambulatory Visit
Admission: RE | Admit: 2022-04-22 | Discharge: 2022-04-22 | Disposition: A | Discharge status: Home / Self Care | Payer: PPO | Source: Ambulatory Visit | Attending: Radiation Oncology | Admitting: Radiation Oncology

## 2022-04-22 ENCOUNTER — Inpatient Hospital Stay: Payer: PPO

## 2022-04-22 ENCOUNTER — Other Ambulatory Visit: Payer: Self-pay

## 2022-04-22 DIAGNOSIS — C7931 Secondary malignant neoplasm of brain: Secondary | ICD-10-CM

## 2022-04-22 DIAGNOSIS — G9389 Other specified disorders of brain: Secondary | ICD-10-CM | POA: Diagnosis not present

## 2022-04-22 MED ORDER — HEPARIN SOD (PORK) LOCK FLUSH 100 UNIT/ML IV SOLN
500.0000 [IU] | Freq: Once | INTRAVENOUS | Status: AC
  Administered 2022-04-22: 500 [IU] via INTRAVENOUS

## 2022-04-22 MED ORDER — GADOPICLENOL 0.5 MMOL/ML IV SOLN
5.0000 mL | Freq: Once | INTRAVENOUS | Status: AC | PRN
  Administered 2022-04-22: 5 mL via INTRAVENOUS

## 2022-04-22 MED ORDER — SODIUM CHLORIDE 0.9% FLUSH
10.0000 mL | INTRAVENOUS | Status: DC | PRN
  Administered 2022-04-22: 10 mL via INTRAVENOUS

## 2022-04-23 NOTE — Progress Notes (Signed)
Radiation Oncology         [phone] ________________________________  Follow-up New Visit   Outpatient   Name: [name] MRN: [id number]  Date: 04/24/2022  DOB: 1950-08-12  IC:HZWRF Chase, Grayling Congress, DO  Ennever, Rose Phi, MD   REFERRING PHYSICIAN: Josph Macho, MD  DIAGNOSIS:     ICD-10-CM   1. Metastasis to brain [facility])  C79.31       The primary encounter diagnosis was Small cell lung cancer, left upper lobe (HCC). 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) was also pertinent to this visit.   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 Now with widespread recurrent disease (PET 04/02/22) including brain metastasis   Cancer Staging  Small cell lung cancer, left upper lobe (HCC) Staging form: Lung, AJCC 8th Edition - Clinical stage from 06/06/2021: Stage IVB (cT2b, cN3, pM1c) - Signed by Josph Macho, MD on 06/06/2021  Interval Since Last Radiation: 3 months and 14 days   Intent: Curative  Radiation Treatment Dates: 11/27/2021 through 01/08/2022 Site Technique Total Dose (Gy) Dose per Fx (Gy) Completed Fx Beam Energies  Lung, Left: Lung_L 3D 60/60 2 30/30 6X    Narrative / Interval History 04/24/22::[name] is a 72 y.o. female who presents today for consideration of SRS in management of her recently diagnosed brain metastasis from left lung cancer primary. The patient was seen in consultation by Dr. Roselind Messier on 04/16/22. During which time, Dr. Roselind Messier discussed the indication for [facility] based on the patient's poor prognosis and small tumor size. To review, MRI of the brain with and without contrast on 04/09/21 first demonstrated interval development of 3 x 7 mm enhancing lesion in the right posterior cerebellum compatible with metastatic disease, and new enhancing lesions in  the calvarium compatible with metastatic disease.  Dr. Roselind Messier has since ordered an MRI if the brain with and without contrast, performed on 04/22/21, which demonstrated two small ill-defined areas of enhancement in the right cerebellar hemisphere suspicious for metastatic disease. One of the appreciable lesions correlates with the previously seen lesion in the right cerebellar hemisphere, measuring up to 0.7 cm. The other lesion/enhancement (measuring 5 mm) is located in the more superolateral right cerebellar hemisphere, and was not appreciated on her initial MRI. MRI otherwise showed stability of the metastatic lesions in the calvarium.    HPI Initial Consultation w/ Dr. Roselind Messier on 04/16/22:: [name] is a 72 y.o. female who is accompanied by her supportive husband. She is seen as a courtesy of Dr. Myna Hidalgo for an opinion concerning further radiation therapy as part of management for her recent recurrence of metastatic lung cancer. I last met with the patient on 02/09/22 for follow-up of radiation to the left lung. She was doing well and NED on examination at that time.    In the interval since her last visit, the patient presented to an urgent care on 02/25/22 with hypoxia. On UC exam, she was found to be febrile and hypoxic and was advised to present to the ED for further management. CTA of the chest performed on admission was positive for PE involving the right upper lung distal segmental and subsegmental vessels. There was also concern for underlying airspace disease suspicious for multifocal pneumonia. CT also showed prominent mediastinal and abdominal lymph nodes (likely metastatic), and multiple  known bone metastases. MRI of the liver performed on 12/03 showed hemosiderosis as well as 1.3 cm left adrenal mass concerning for adrenal metastasis. RUQ abdominal ultrasound also performed showed a mass-like hypoechoic region is identified in the right hepatic lobe measuring up to 3.2 cm Hospital course  included supplemental O2, anticoagulation which was transitioned to Lovenox per oncology recommendations, antibiotics, and steroid taper. The patient was discharged on 03/12/22 with home oxygen given persistent exertional hypoxia.    Following discharge, the patient had a restaging PET scan performed on 04/02/22 which showed: post treatment scarring in the parahilar lower left lung with hypermetabolism in the posterior paraspinal component of the scarring (recurrent disease not excluded); interval development of hypermetabolic nodal disease in the mediastinum and left hilum; a 1.5 cm hypermetabolic soft tissue nodule in the deep posterior left costophrenic sulcus consistent with metastatic disease; new hypermetabolic disease in the liver, upper midline abdomen and left adrenal gland; and new bony disease in the cervical spine, right mandible, right humerus, right clavicle, bilateral ribs, thoracic spine, lumbar spine, and bony pelvis. PET also demonstrated the previously seen scattered lytic lesions with sclerotic borders as non-hypermetabolic, and low-level FDG accumulation in the distal esophagus possibly related to esophagitis.      For her recurrent disease, Dr. Myna Hidalgo has recommended lurbinectedin (Zepzelca) x3 cycles. Dr. Myna Hidalgo has informed the patient and her husband that if lurbinectedin works, she may possibly be with [place] for one more year. If treatment does not work, she may not be with [place] in 3 to 4 months. The patient understands this and is willing to pursue treatment. She received her first infusion yesterday.    Pertinent imaging thus far includes an MRI of the brain with and without contrast on 04/09/21 which shows interval development of 3 x 7 mm enhancing lesion in the right posterior cerebellum compatible with metastatic disease, and new enhancing lesions in the calvarium compatible with metastatic disease.   Today she reports an episode of dizziness this morning that she attributes to  her chemotherapy. She otherwise denies any troubles with gait, numbness, tingling, vision changes.  She reports some mild headaches.  I personally reviewed her images.  Dose of Decadron, if applicable: 8mg  daily for 2 days after her chemotherapy treatments  Recent neurologic symptoms, if any:  Seizures: no Headaches: yes, daily headaches Nausea: none Dizziness/ataxia: none Difficulty with hand coordination: none Focal numbness/weakness: none Visual deficits/changes: floaters  Confusion/Memory deficits: none   PAST MEDICAL HISTORY:  has a past medical history of Arthritis, Cancer (HCC), Diabetes mellitus, Esophagitis, Goals of care, counseling/discussion (05/23/2021), Hiatal hernia, History of radiation therapy, Hx of colonic polyps ssp and adenoma (03/06/2018), Hypertension, Hypothyroidism, OSA on CPAP, Sleep apnea, 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    PAST SURGICAL HISTORY: Past Surgical History:  Procedure Laterality Date   ABDOMINAL HYSTERECTOMY     BACK SURGERY     x3   BACK SURGERY     x 5 , 2 neck surgeries, 2 lower back surgeries   BRONCHIAL BIOPSY  06/02/2021   Procedure: BRONCHIAL BIOPSIES;  Surgeon: 06/04/2021, MD;  Location: [facility] ENDOSCOPY;  Service: Pulmonary;;   BRONCHIAL BRUSHINGS  06/02/2021   Procedure: BRONCHIAL BRUSHINGS;  Surgeon: 06/04/2021, MD;  Location: [facility] ENDOSCOPY;  Service: Pulmonary;;  BRONCHIAL NEEDLE ASPIRATION BIOPSY  06/02/2021   Procedure: BRONCHIAL NEEDLE ASPIRATION BIOPSIES;  Surgeon: Leslye Peer, MD;  Location: [facility] ENDOSCOPY;  Service: Pulmonary;;   CHOLECYSTECTOMY     COLONOSCOPY     ERCP     ESOPHAGOGASTRODUODENOSCOPY     IR IMAGING GUIDED PORT INSERTION  06/13/2021   THYROID SURGERY      TONSILECTOMY, ADENOIDECTOMY, BILATERAL MYRINGOTOMY AND TUBES     VIDEO BRONCHOSCOPY WITH ENDOBRONCHIAL ULTRASOUND Left 06/02/2021   Procedure: VIDEO BRONCHOSCOPY WITH ENDOBRONCHIAL ULTRASOUND;  Surgeon: Leslye Peer, MD;  Location: [facility] ENDOSCOPY;  Service: Pulmonary;  Laterality: Left;    FAMILY HISTORY: family history includes Alcohol abuse in her mother; Breast cancer in her cousin and daughter; Cancer in her brother and daughter; Heart disease in her maternal grandfather; Stroke in her maternal grandmother and mother.  SOCIAL HISTORY:  reports that she quit smoking about 5 years ago. Her smoking use included cigarettes. She has never used smokeless tobacco. She reports that she does not drink alcohol and does not use drugs.  ALLERGIES: Lyrica [pregabalin] and Crestor [rosuvastatin calcium]  MEDICATIONS:  Current Outpatient Medications  Medication Sig Dispense Refill   acetaminophen (TYLENOL) 325 MG tablet Take 1 tablet (325 mg total) by mouth every 6 (six) hours as needed for moderate pain.     albuterol (VENTOLIN HFA) 108 (90 Base) MCG/ACT inhaler Inhale 2 puffs into the lungs every 6 (six) hours as needed for wheezing or shortness of breath. 8 g 2   ALPRAZolam (XANAX) 0.5 MG tablet TAKE ONE TABLET BY MOUTH THREE TIMES A DAY AS NEEDED (Patient taking differently: Take 0.5 mg by mouth 3 (three) times daily as needed for anxiety.) 90 tablet 0   blood glucose meter kit and supplies KIT Use daily 1 each 0   Blood Glucose Monitoring Suppl (ONE TOUCH ULTRA 2) w/Device KIT Check blood sugar as directed. Dx:E11.65 1 kit 0   Blood Glucose Monitoring Suppl (ONETOUCH VERIO FLEX SYSTEM) w/Device KIT USE AS DIRECTED TO CHECK BLOOD SUGAR FOUR TIMES A DAY 1 kit 0   dexamethasone (DECADRON) 4 MG tablet Take 2 tablets (8 mg total) by mouth daily. Start the day after chemotherapy for 2 days. Take with food. 30 tablet 1   FLUoxetine (PROZAC) 20 MG capsule Take 1 capsule (20 mg total) by mouth daily. 90  capsule 3   glucose blood (ONETOUCH VERIO) test strip USE ONE STRIP TO TEST DAILY 100 strip 1   insulin glargine (LANTUS SOLOSTAR) 100 UNIT/ML Solostar Pen Inject 30 Units into the skin daily. HS     Insulin Pen Needle (NOVOTWIST) 32G X 5 MM MISC Use daily at Bedtime with Lantus 100 each 0   ipratropium-albuterol (DUONEB) 0.5-2.5 (3) MG/3ML SOLN Take 3 mLs by nebulization every 6 (six) hours as needed. 360 mL 3   levothyroxine (SYNTHROID) 125 MCG tablet Take 1 tablet (125 mcg total) by mouth daily before breakfast. 90 tablet 3   lidocaine-prilocaine (EMLA) cream Apply 1 Application topically as needed. 30 g 0   meclizine (ANTIVERT) 12.5 MG tablet Take 1 tablet (12.5 mg total) by mouth every 6 (six) hours as needed for dizziness. 30 tablet 0   metFORMIN (GLUCOPHAGE-XR) 500 MG 24 hr tablet Take 1 tablet (500 mg total) by mouth in the morning and at bedtime. 180 tablet 0   omeprazole (PRILOSEC) 20 MG capsule Take 1 capsule (20 mg total) by mouth daily. (Patient taking differently: Take 20 mg by mouth daily as needed (indigestion).) 90  capsule 3   oxyCODONE ER (XTAMPZA ER) 13.5 MG C12A Take 13.5 mg by mouth 2 (two) times daily. (Patient taking differently: Take 13.5 mg by mouth 2 (two) times daily as needed (pain).) 60 capsule 0   oxyCODONE-acetaminophen (PERCOCET) 7.5-325 MG tablet Take 1 tablet by mouth every 6 (six) hours as needed for severe pain. 90 tablet 0   TRULICITY 0.75 MG/0.5ML SOPN Inject 0.75 mg into the skin every Saturday.     UNABLE TO FIND Please provide hair prothesis for patient undergoing chemotherapy for cancer diagnosis. 1 Units 0   valACYclovir (VALTREX) 1000 MG tablet TAKE ONE TABLET BY MOUTH DAILY AS NEEDED (Patient taking differently: Take 500 mg by mouth daily as needed (prevent outbreaks).) 30 tablet 0   enoxaparin (LOVENOX) 80 MG/0.8ML injection Inject 0.7 mLs (70 mg total) into the skin daily. 21 mL 3   meloxicam (MOBIC) 15 MG tablet TAKE 1 TABLET BY MOUTH DAILY 30 tablet 2    Current Facility-Administered Medications  Medication Dose Route Frequency Provider Last Rate Last Admin   ipratropium-albuterol (DUONEB) 0.5-2.5 (3) MG/3ML nebulizer solution 3 mL  3 mL Nebulization Q6H Lowne Chase, Myrene Buddy R, DO       Facility-Administered Medications Ordered in Other Encounters  Medication Dose Route Frequency Provider Last Rate Last Admin   sodium chloride flush (NS) 0.9 % injection 10 mL  10 mL Intravenous PRN Erenest Blank, NP   10 mL at 08/18/21 0942    REVIEW OF SYSTEMS:  As above.   PHYSICAL EXAM:  weight is 111 lb 12.8 oz (50.7 kg). Her oral temperature is 96.7 F (35.9 C) (abnormal). Her blood pressure is 106/75 and her pulse is 99. Her respiration is 18 and oxygen saturation is 99%.   General: Alert and oriented, in no acute distress -breathing oxygen through nasal cannula HEENT: Head is normocephalic. Extraocular movements are intact. (Today I do not appreciate nystagmus as previously appreciated by Dr. Roselind Messier) Oropharynx is clear. Musculoskeletal: symmetric strength and muscle tone throughout.  She is independently ambulatory Neurologic: Cranial nerves II through XII are grossly intact. No obvious focalities. Speech is fluent. Coordination is intact. Psychiatric: Judgment and insight are intact. Affect is appropriate.  KPS = 70  100 - Normal; no complaints; no evidence of disease. 90   - Able to carry on normal activity; minor signs or symptoms of disease. 80   - Normal activity with effort; some signs or symptoms of disease. 44   - Cares for self; unable to carry on normal activity or to do active work. 60   - Requires occasional assistance, but is able to care for most of his personal needs. 50   - Requires considerable assistance and frequent medical care. 40   - Disabled; requires special care and assistance. 30   - Severely disabled; hospital admission is indicated although death not imminent. 20   - Very sick; hospital admission necessary; active  supportive treatment necessary. 10   - Moribund; fatal processes progressing rapidly. 0     - Dead  Karnofsky DA, Abelmann WH, Craver LS and Burchenal [facility] [phone]) The use of the nitrogen mustards in the palliative treatment of carcinoma: with particular reference to bronchogenic carcinoma Cancer 1 [id number]  LABORATORY DATA:  Lab Results  Component Value Date   WBC 6.1 04/15/2022   HGB 11.0 (L) 04/15/2022   HCT 33.7 (L) 04/15/2022   MCV 93.6 04/15/2022   PLT 194 04/15/2022   CMP     Component Value  Date/Time   NA 134 (L) 04/15/2022 0915   K 4.2 04/15/2022 0915   CL 100 04/15/2022 0915   CO2 27 04/15/2022 0915   GLUCOSE 116 (H) 04/15/2022 0915   BUN 11 04/15/2022 0915   CREATININE 0.59 04/15/2022 0915   CREATININE 0.88 05/08/2021 1126   CALCIUM 9.3 04/15/2022 0915   PROT 7.0 04/15/2022 0915   ALBUMIN 3.3 (L) 04/15/2022 0915   AST 23 04/15/2022 0915   ALT 14 04/15/2022 0915   ALKPHOS 53 04/15/2022 0915   BILITOT 0.2 (L) 04/15/2022 0915   GFRNONAA >60 04/15/2022 0915   GFRAA >60 10/16/2018 2157         RADIOGRAPHY: MR Brain W Wo Contrast  Result Date: 04/23/2022 CLINICAL DATA:  Small-cell lung cancer. EXAM: MRI HEAD WITHOUT AND WITH CONTRAST TECHNIQUE: Multiplanar, multiecho pulse sequences of the brain and surrounding structures were obtained without and with intravenous contrast. CONTRAST:  5 cc Vueway COMPARISON:  Brain MRI 04/09/2021 FINDINGS: Brain: Again seen is ill-defined curvilinear enhancement in the right cerebellar hemisphere measuring up to 0.7 cm (13-33). There is some intrinsic T1 hyperintensity in this region but there does appear to be some enhancement. There is an additional 5 mm focus of enhancement in the right cerebellar hemisphere more superolaterally, not definitely seen on the prior study, though this is likely due to differences in technique (13-43). These enhancing foci are not well seen on the coronal and sagittal postcontrast images due to motion  degradation. There is mild associated FLAIR signal abnormality without regional mass effect. There are no other enhancing lesions. There is no acute intracranial hemorrhage, extra-axial fluid collection, or acute infarct Parenchymal volume is stable. The ventricles are stable in size. 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 The pituitary and suprasellar region are normal. There is no midline shift. Vascular: Normal flow voids. Skull and upper cervical spine: Postsurgical changes are noted in the upper cervical spine. Enhancing metastatic lesions are again seen in the left frontal bone (13-140) and occipital bone at the midline (13-61). Sinuses/Orbits: The paranasal sinuses are clear. The globes and orbits are unremarkable. Other: None. IMPRESSION: 1. Two small ill-defined areas of enhancement in the right cerebellar hemisphere suspicious for metastatic disease. 2. Unchanged calvarial or metastatic lesions. Electronically Signed   By: Lesia Hausen M.D.   On: 04/23/2022 09:41   MR Brain W Wo Contrast  Result Date: 04/09/2022 CLINICAL DATA:  Small cell lung cancer. Staging for metastatic disease EXAM: MRI HEAD WITHOUT AND WITH CONTRAST TECHNIQUE: Multiplanar, multiecho pulse sequences of the brain and surrounding structures were obtained without and with intravenous contrast. CONTRAST:  78mL GADAVIST GADOBUTROL 1 MMOL/ML IV SOLN COMPARISON:  MRI head 11/10/2021 FINDINGS: Brain: New area of enhancement in the right posterior cerebellum measuring proximally 3 x 7 mm. Mild hyperintensity on FLAIR which was not present previously. Findings compatible with metastatic disease. No other enhancing lesions in the brain. Ventricle size normal. Patchy white matter hypodensity bilaterally. No acute infarct. No intracranial hemorrhage Vascular: Normal arterial flow voids Skull and upper cervical spine: New enhancing lesions  in the calvarium compatible with metastatic disease. There is an 8 mm lesion in the left frontal bone and a 10 mm lesion in the occipital bone in the midline. There may also be a third mildly enhancing lesion in the left frontal bone on axial image 107. Anterior and posterior cervical spine fusion with hardware. Sinuses/Orbits: Paranasal sinuses clear.  Negative orbit Other: None IMPRESSION: 1. Interval development of 3 x 7 mm enhancing lesion right posterior cerebellum compatible with metastatic disease. No other enhancing lesions in the brain. 2. New enhancing lesions in the calvarium compatible with metastatic disease. Electronically Signed   By: Marlan Palau M.D.   On: 04/09/2022 16:54   NM PET Image Restag (PS) Skull Base To Thigh  Result Date: 04/07/2022 CLINICAL DATA:  Subsequent treatment strategy for small-cell lung cancer. EXAM: NUCLEAR MEDICINE PET SKULL BASE TO THIGH TECHNIQUE: 5.5 mCi F-18 FDG was injected intravenously. Full-ring PET imaging was performed from the skull base to thigh after the radiotracer. CT data was obtained and used for attenuation correction and anatomic localization. Fasting blood glucose: 108 mg/dl COMPARISON:  Chest CTA 02/25/2022.  PET-CT 11/03/2021 FINDINGS: Mediastinal blood pool activity: SUV max 2.5 Liver activity: SUV max NA NECK: No hypermetabolic lymph nodes in the neck. Incidental CT findings: None. CHEST: Right paratracheal node is progressive in the interval measuring 13 mm short axis today compared to 10 mm short axis previously. SUV max = 14.7. Small left hilar lymph node is hypermetabolic with SUV max = 3.7. Radiation fibrosis noted in the parahilar lower left lung with hypermetabolism noted in the paraspinal component of the scarring, demonstrating SUV max = 5.6. 1.5 cm soft tissue nodule posterior to the spleen on image 96/4 is probably in the deep posterior left costophrenic sulcus. This nodule is markedly hypermetabolic with SUV max = 59. Low level FDG  accumulation in the distal esophagus may be related to esophagitis. Incidental CT findings: Right Port-A-Cath tip is positioned in the right atrium. Coronary artery calcification is evident. Mild atherosclerotic calcification is noted in the wall of the thoracic aorta. ABDOMEN/PELVIS: A new 2.9 cm low-density lesion in the dome of the liver is hypermetabolic with SUV max = 8.8. A new focus of hypermetabolism is identified along the cranial aspect of the pancreatic head. There is no underlying lymph node or mass lesion evident at this location although SUV max = 11.8. A new hypermetabolic 18 mm left adrenal nodule is identified. Incidental CT findings: Gallbladder surgically absent. There is mild atherosclerotic calcification of the abdominal aorta without aneurysm. Diverticular changes noted left colon without diverticulitis. SKELETON: Multiple new hypermetabolic skeletal metastases are identified in the cervical spine, right mandible, right humerus, right clavicle, bilateral ribs, thoracic spine, lumbar spine, and bony pelvis. Index right sacral lesion demonstrates SUV max = 45. Incidental CT findings: Scattered lytic lesions with sclerotic borders seen on the previous study persist today without hypermetabolism. IMPRESSION: 1. Post treatment scarring in the parahilar lower left lung identified. There is hypermetabolism in the posterior paraspinal component of the scarring. Recurrent disease is not excluded. 2. Interval development of hypermetabolic nodal disease in the mediastinum and left hilum. 3. 1.5 cm hypermetabolic soft tissue nodule in the deep posterior left costophrenic sulcus, consistent with metastatic disease. 4. New hypermetabolic disease in the liver, upper midline abdomen, left adrenal gland, and bones. 5. Scattered lytic lesions with sclerotic borders seen on the previous study persist today without hypermetabolism. 6. Low level FDG accumulation in the distal esophagus may be related to  esophagitis. 7.  Aortic Atherosclerosis (ICD10-I70.0). Electronically Signed   By: Kennith Center M.D.   On: 04/07/2022 08:37      IMPRESSION/PLAN: This is a very pleasant 72year old woman with metastatic disease to the brain.  Given her comorbidities, overall prognosis, age, etc., she is not enthusiastic about pursuing whole brain radiation even though it  was explained to her by Dr. Roselind Messier that whole brain radiation for approximately 2 weeks is the standard treatment to address small cell lung cancer metastatic to brain.  An alternative would be targeted stereotactic radiosurgery in which we only treat the 2 metastases in the cerebellum.  This would carry a lower risk of acute and long-term side effects but she will also be at a high risk for elsewhere recurrences in the brain   After lengthy discussion, the patient would like to proceed with stereotactic brain radiosurgery to their metastatic disease. They will meet with neurosurgery in the near future to discuss this further; a neurosurgeon will participate in their case.  After discussion of the risks benefits and side effects of radiation in the form of radiosurgery to the brain, acknowledging the risk of focal hair loss, fatigue, headache, dizziness, nausea, brain injury, radiation necrosis, seizures, the patient signed a consent form and is enthusiastic to proceed.  CT simulation will take place today and treatment next week.  I plan to deliver 20 Gy in 1 fraction to both metastases in the cerebellum.  On date of service, in total, I spent 60 minutes on this encounter. Patient was seen in person.   __________________________________________   Lonie Peak, MD  This document serves as a record of services personally performed by Lonie Peak, MD. It was created on her behalf by Neena Rhymes, a trained medical scribe. The creation of this record is based on the scribe's personal observations and the provider's statements to them. This document  has been checked and approved by the attending provider.

## 2022-04-24 ENCOUNTER — Ambulatory Visit
Admission: RE | Admit: 2022-04-24 | Discharge: 2022-04-24 | Disposition: A | Discharge status: Home / Self Care | Payer: PPO | Source: Ambulatory Visit | Attending: Radiation Oncology | Admitting: Radiation Oncology

## 2022-04-24 ENCOUNTER — Other Ambulatory Visit: Payer: Self-pay

## 2022-04-24 ENCOUNTER — Encounter: Payer: Self-pay | Admitting: Radiation Oncology

## 2022-04-24 VITALS — BP 106/75 | HR 99 | Temp 96.7°F | Resp 18 | Wt 111.8 lb

## 2022-04-24 DIAGNOSIS — Z79890 Hormone replacement therapy (postmenopausal): Secondary | ICD-10-CM | POA: Insufficient documentation

## 2022-04-24 DIAGNOSIS — E278 Other specified disorders of adrenal gland: Secondary | ICD-10-CM | POA: Insufficient documentation

## 2022-04-24 DIAGNOSIS — E039 Hypothyroidism, unspecified: Secondary | ICD-10-CM | POA: Diagnosis not present

## 2022-04-24 DIAGNOSIS — C3412 Malignant neoplasm of upper lobe, left bronchus or lung: Secondary | ICD-10-CM | POA: Diagnosis not present

## 2022-04-24 DIAGNOSIS — E119 Type 2 diabetes mellitus without complications: Secondary | ICD-10-CM | POA: Diagnosis not present

## 2022-04-24 DIAGNOSIS — Z79899 Other long term (current) drug therapy: Secondary | ICD-10-CM | POA: Diagnosis not present

## 2022-04-24 DIAGNOSIS — C7931 Secondary malignant neoplasm of brain: Secondary | ICD-10-CM | POA: Diagnosis not present

## 2022-04-24 DIAGNOSIS — Z7952 Long term (current) use of systemic steroids: Secondary | ICD-10-CM | POA: Insufficient documentation

## 2022-04-24 DIAGNOSIS — Z923 Personal history of irradiation: Secondary | ICD-10-CM | POA: Insufficient documentation

## 2022-04-24 DIAGNOSIS — Z791 Long term (current) use of non-steroidal anti-inflammatories (NSAID): Secondary | ICD-10-CM | POA: Insufficient documentation

## 2022-04-24 DIAGNOSIS — C787 Secondary malignant neoplasm of liver and intrahepatic bile duct: Secondary | ICD-10-CM | POA: Diagnosis not present

## 2022-04-24 DIAGNOSIS — C7889 Secondary malignant neoplasm of other digestive organs: Secondary | ICD-10-CM | POA: Insufficient documentation

## 2022-04-24 DIAGNOSIS — Z794 Long term (current) use of insulin: Secondary | ICD-10-CM | POA: Diagnosis not present

## 2022-04-24 DIAGNOSIS — Z7984 Long term (current) use of oral hypoglycemic drugs: Secondary | ICD-10-CM | POA: Insufficient documentation

## 2022-04-24 DIAGNOSIS — Z86010 Personal history of colonic polyps: Secondary | ICD-10-CM | POA: Insufficient documentation

## 2022-04-24 DIAGNOSIS — C7951 Secondary malignant neoplasm of bone: Secondary | ICD-10-CM | POA: Diagnosis not present

## 2022-04-24 DIAGNOSIS — Z510 Encounter for antineoplastic radiation therapy: Secondary | ICD-10-CM | POA: Insufficient documentation

## 2022-04-24 DIAGNOSIS — I10 Essential (primary) hypertension: Secondary | ICD-10-CM | POA: Diagnosis not present

## 2022-04-24 DIAGNOSIS — Z87891 Personal history of nicotine dependence: Secondary | ICD-10-CM | POA: Insufficient documentation

## 2022-04-24 MED ORDER — HEPARIN SOD (PORK) LOCK FLUSH 100 UNIT/ML IV SOLN
500.0000 [IU] | Freq: Once | INTRAVENOUS | Status: AC
  Administered 2022-04-24: 500 [IU] via INTRAVENOUS

## 2022-04-24 MED ORDER — SODIUM CHLORIDE 0.9% FLUSH
3.0000 mL | Freq: Once | INTRAVENOUS | Status: AC
  Administered 2022-04-24: 3 mL via INTRAVENOUS

## 2022-04-24 NOTE — Progress Notes (Signed)
Has armband been applied?  Yes.    Does patient have an allergy to IV contrast dye?: No.   Has patient ever received premedication for IV contrast dye?: No.   Does patient take metformin?: Yes.    If patient does take metformin when was the last dose:    Date of lab work: 04-15-22 BUN: 11 CR: 0.59 eGfr: greater than 60  IV site: right chest port  Has IV site been added to flowsheet?  Yes.    There were no vitals taken for this visit.

## 2022-04-27 DIAGNOSIS — Z87891 Personal history of nicotine dependence: Secondary | ICD-10-CM | POA: Diagnosis not present

## 2022-04-27 DIAGNOSIS — C7931 Secondary malignant neoplasm of brain: Secondary | ICD-10-CM | POA: Diagnosis not present

## 2022-04-27 DIAGNOSIS — Z510 Encounter for antineoplastic radiation therapy: Secondary | ICD-10-CM | POA: Diagnosis not present

## 2022-04-27 DIAGNOSIS — C3412 Malignant neoplasm of upper lobe, left bronchus or lung: Secondary | ICD-10-CM | POA: Diagnosis not present

## 2022-04-28 DIAGNOSIS — C7931 Secondary malignant neoplasm of brain: Secondary | ICD-10-CM | POA: Diagnosis not present

## 2022-04-28 DIAGNOSIS — C3412 Malignant neoplasm of upper lobe, left bronchus or lung: Secondary | ICD-10-CM | POA: Diagnosis not present

## 2022-04-28 DIAGNOSIS — Z87891 Personal history of nicotine dependence: Secondary | ICD-10-CM | POA: Diagnosis not present

## 2022-04-29 ENCOUNTER — Ambulatory Visit
Admission: RE | Admit: 2022-04-29 | Discharge: 2022-04-29 | Disposition: A | Discharge status: Home / Self Care | Payer: PPO | Source: Ambulatory Visit | Attending: Radiation Oncology | Admitting: Radiation Oncology

## 2022-04-29 ENCOUNTER — Other Ambulatory Visit: Payer: Self-pay

## 2022-04-29 DIAGNOSIS — Z87891 Personal history of nicotine dependence: Secondary | ICD-10-CM | POA: Diagnosis not present

## 2022-04-29 DIAGNOSIS — C3412 Malignant neoplasm of upper lobe, left bronchus or lung: Secondary | ICD-10-CM | POA: Diagnosis not present

## 2022-04-29 DIAGNOSIS — C7931 Secondary malignant neoplasm of brain: Secondary | ICD-10-CM | POA: Diagnosis not present

## 2022-04-29 DIAGNOSIS — Z510 Encounter for antineoplastic radiation therapy: Secondary | ICD-10-CM | POA: Diagnosis not present

## 2022-04-29 LAB — RAD ONC ARIA SESSION SUMMARY
Course Elapsed Days: 0
Plan Fractions Treated to Date: 1
Plan Prescribed Dose Per Fraction: 20 Gy
Plan Total Fractions Prescribed: 1
Plan Total Prescribed Dose: 20 Gy
Reference Point Dosage Given to Date: 20 Gy
Reference Point Session Dosage Given: 20 Gy
Session Number: 1

## 2022-04-29 NOTE — Progress Notes (Signed)
[name] rested with [place] for 30 minutes following SRS treatment.  Patient denies headache, dizziness, nausea, diplopia or ringing in the ears. Denies fatigue. Patient without complaints. Understands to avoid strenuous activity for the next 24 hours and call [phone] with needs. Patient ambulated out of clinic unassisted without incident to husband's vehicle  Vitals:   04/29/22 1524  BP: 102/79  Pulse: (!) 104  Resp: 18  Temp: 99.9 F (37.7 C)  SpO2: 100%

## 2022-04-29 NOTE — Op Note (Signed)
  Name: [name]  MRN: [id number]  Date: 04/29/2022   DOB: 11/06/1950  Stereotactic Radiosurgery Operative Note  PRE-OPERATIVE DIAGNOSIS:  Multiple Brain Metastases, small-cell lung CA  POST-OPERATIVE DIAGNOSIS:  Multiple Brain Metastases, small-cell lung CA  PROCEDURE:  Stereotactic Radiosurgery  SURGEON:  Jairo Ben, MD  RADIATION ONCOLOGIST: Dr. Eppie Gibson, MD  NARRATIVE: The patient underwent a radiation treatment planning session in the radiation oncology simulation suite under the care of the radiation oncology physician and physicist.  I participated closely in the radiation treatment planning afterwards. The patient underwent planning CT which was fused to 3T high resolution MRI with 1 mm axial slices.  These images were fused on the planning system.  We contoured the gross target volumes and subsequently expanded this to yield the Planning Target Volume. I actively participated in the planning process.  I helped to define and review the target contours and also the contours of the optic pathway, eyes, brainstem and selected nearby organs at risk.  All the dose constraints for critical structures were reviewed and compared to AAPM Task Group 101.  The prescription dose conformity was reviewed.  I approved the plan electronically.    Accordingly, [name] was brought to the TrueBeam stereotactic radiation treatment linac and placed in the custom immobilization mask.  The patient was aligned according to the IR fiducial markers with Brain[facility] Exactrac, then orthogonal x-rays were used in ExacTrac with the 6DOF robotic table and the shifts were made to align the patient  [name] received stereotactic radiosurgery uneventfully to an Rx dose of 20Gy to both lesions.  Lesions treated:  2 right cerebellar lesions   Complex lesions treated:  0 (>3.5 cm, <16mm of optic path, or within the brainstem)   The detailed description of the procedure is recorded in the radiation  oncology procedure note.  I was present for the duration of the procedure.  DISPOSITION:  Following delivery, the patient was transported to nursing in stable condition and monitored for possible acute effects to be discharged to home in stable condition with follow-up in one month.  Jairo Ben, MD 04/29/2022 4:32 PM

## 2022-05-02 DIAGNOSIS — J449 Chronic obstructive pulmonary disease, unspecified: Secondary | ICD-10-CM | POA: Diagnosis not present

## 2022-05-02 NOTE — Radiation Completion Notes (Signed)
Patient Name: JENAVEVE, FENSTERMAKER MRN: [id number] Date of Birth: [birth year] Referring Physician: Burney Gauze, M.D. Date of Service: 2022-05-02 Radiation Oncologist: Eppie Gibson, M.D. 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

## 2022-05-04 ENCOUNTER — Encounter: Payer: Self-pay | Admitting: Nurse Practitioner

## 2022-05-05 ENCOUNTER — Ambulatory Visit (INDEPENDENT_AMBULATORY_CARE_PROVIDER_SITE_OTHER): Payer: PPO

## 2022-05-05 ENCOUNTER — Other Ambulatory Visit: Payer: Self-pay

## 2022-05-05 ENCOUNTER — Ambulatory Visit (INDEPENDENT_AMBULATORY_CARE_PROVIDER_SITE_OTHER): Payer: PPO | Admitting: Nurse Practitioner

## 2022-05-05 ENCOUNTER — Encounter: Payer: Self-pay | Admitting: Nurse Practitioner

## 2022-05-05 VITALS — BP 120/74 | HR 123 | Temp 98.1°F | Ht 59.0 in | Wt 112.6 lb

## 2022-05-05 DIAGNOSIS — I2782 Chronic pulmonary embolism: Secondary | ICD-10-CM

## 2022-05-05 DIAGNOSIS — J9601 Acute respiratory failure with hypoxia: Secondary | ICD-10-CM | POA: Diagnosis not present

## 2022-05-05 DIAGNOSIS — J984 Other disorders of lung: Secondary | ICD-10-CM | POA: Diagnosis not present

## 2022-05-05 DIAGNOSIS — J189 Pneumonia, unspecified organism: Secondary | ICD-10-CM

## 2022-05-05 DIAGNOSIS — I2609 Other pulmonary embolism with acute cor pulmonale: Secondary | ICD-10-CM

## 2022-05-05 DIAGNOSIS — R059 Cough, unspecified: Secondary | ICD-10-CM

## 2022-05-05 DIAGNOSIS — M954 Acquired deformity of chest and rib: Secondary | ICD-10-CM | POA: Diagnosis not present

## 2022-05-05 DIAGNOSIS — C3412 Malignant neoplasm of upper lobe, left bronchus or lung: Secondary | ICD-10-CM

## 2022-05-05 DIAGNOSIS — Z85118 Personal history of other malignant neoplasm of bronchus and lung: Secondary | ICD-10-CM | POA: Diagnosis not present

## 2022-05-05 DIAGNOSIS — M419 Scoliosis, unspecified: Secondary | ICD-10-CM | POA: Diagnosis not present

## 2022-05-05 MED ORDER — ENOXAPARIN SODIUM 80 MG/0.8ML IJ SOSY: 70.0000 mg | PREFILLED_SYRINGE | Freq: Every day | INTRAMUSCULAR | 3 refills | Status: DC

## 2022-05-05 NOTE — Patient Instructions (Signed)
Continue Albuterol inhaler 2 puffs or duoneb every 6 hours as needed for shortness of breath or wheezing. Notify if symptoms persist despite rescue inhaler/neb use. Continue to monitor your oxygen at home for goal >88-90%. Ok to stop using your supplemental oxygen during the day.  We may repeat oxygen study overnight to ensure you don't need to continue to use your oxygen at bedtime. I will call you about instructions on this.   Restart your lovenox injections today. You need this for a minimum of 3 months from your hospital stay. There is a high likelihood you will remain on this lifelong but you can discuss with Dr. Marin Olp at your appointment on Thursday.    If you develop worsening shortness of breath, chest discomfort or decreased oxygen levels, please go to the ED   Follow up in 3 months with Dr. Lamonte Sakai. If symptoms do not improve or worsen, please contact office for sooner follow up or seek emergency care.

## 2022-05-05 NOTE — Assessment & Plan Note (Signed)
Clinically improved. She has completed abx and steroid courses. CXR today with significant improvement; persistent left hilar opacity, which is consistent with know malignant disease and present since CT/PET in February 2023. We will continue to monitor her symptoms moving forward. Strict return precautions.   Patient Instructions  Continue Albuterol inhaler 2 puffs or duoneb every 6 hours as needed for shortness of breath or wheezing. Notify if symptoms persist despite rescue inhaler/neb use. Continue to monitor your oxygen at home for goal >88-90%. Ok to stop using your supplemental oxygen during the day.  We may repeat oxygen study overnight to ensure you don't need to continue to use your oxygen at bedtime. I will call you about instructions on this.   Restart your lovenox injections today. You need this for a minimum of 3 months from your hospital stay. There is a high likelihood you will remain on this lifelong but you can discuss with Dr. Marin Olp at your appointment on Thursday.    If you develop worsening shortness of breath, chest discomfort or decreased oxygen levels, please go to the ED   Follow up in 3 months with Dr. Lamonte Sakai. If symptoms do not improve or worsen, please contact office for sooner follow up or seek emergency care.

## 2022-05-05 NOTE — Assessment & Plan Note (Signed)
Likely provoked in the setting of pna/pneumonitis. She will need at least 3 months of therapy but given her history, she is high risk for recurrence so may need lifelong therapy. We discussed this at her last OV. Unfortunately, they have stopped therapy and did not realize she had refills on file. I am concerned that is the contributing factor to her tachycardia today. Fortunately, she is stable from a symptom standpoint and not currently having any new O2 requirements. Discussed with Dr. Chase Caller. Given her stability, we will plan to restart her lovenox injections. Educated patient and her spouse on the importance of compliance with her anticoagulation therapy. Medication education provided and again reviewed red flag findings with therapy. Verbalized understanding. I have also sent a message to Dr. Marin Olp, as they are the ones who manage her therapy, so they can discuss alternative, more cost affordable options, if possible. Strict ED precautions provided to pt in interim.

## 2022-05-05 NOTE — Assessment & Plan Note (Signed)
Extensive disease. Mets to liver, bone, lymph nodes, brain. Follow up with oncology as scheduled.

## 2022-05-05 NOTE — Assessment & Plan Note (Signed)
Resolved. Walking oximetry completed again today without desaturation on room air. Advised she could discontinue daytime oxygen at this point. She will monitor saturations at home for goal >88-90%. She did have ONO; results requested from DME. She is unsure if she completed this on room air or on her oxygen. Given this, we will likely need to repeat her study either way but will wait on results to determine next steps.

## 2022-05-05 NOTE — Progress Notes (Signed)
@Patient  ID: [name], female    DOB: 09-Nov-1950, 72 y.o.   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  Followed by Dr. Marin Olp with oncology.  She is a patient of Dr. Agustina Caroli and last seen in office 03/26/2022 by [facility].  Past medical history significant for diabetes, GERD with hiatal hernia, OSA on CPAP, osteoarthritis and DJD.  She was recently admitted from 02/25/2022 to 03/12/2022 for acute hypoxemic respiratory failure felt to be multifactorial related to pulmonary embolism and possible pneumonia versus radiation pneumonitis.  She had presented to the ED with hypoxia and URI associated with myalgias, chills, fever and urinary frequency and diarrhea.  COVID at home was negative.  CTA demonstrated small segmental and 6 subsegmental PE in right upper lobe and patchy infiltrates bilaterally concerning for pneumonia.  No evidence of right heart strain on echo. She was started on supplemental oxygen, ceftriaxone and and azithromycin as well as heparin drip.  Oxygen requirements decreased; still required supplemental oxygen with ambulation.  She was discharged home on 2 L.  Her antibiotics were completed during her hospital stay.  She was discharged on steroid taper for possible pneumonitis as well as Lovenox for management of her PE.  TEST/EVENTS:  02/25/2022 CTA chest: Segmental and subsegmental right upper lobe pulmonary artery suggestive of nonocclusive PE.  Hiatal hernia.  Unchanged lymphadenopathy.  Emphysema is present.  There is patchy GGO bilaterally.  Prior bone mets seen  03/26/2022: OV with [facility] NP for hospital follow-up.  Hospitalized 02/25/2022-03/12/2022 for  acute respiratory failure secondary to multifocal pna vs radiation induced pneumonitis, less likely PE. She completed antimicrobial therapy during her hospital stay. She was discharged on extended prednisone taper, bactrim for PJP prophylaxis, and supplemental O2. She is clinically improved. Has completed steroid course within the last few days. Instructed her that she should discontinue bactrim now as this was prophylaxis while on high dose steroids. Walking oximetry today without desaturations on room air. Plan to repeat imaging in 6 weeks to ensure resolution. She was provided with strict return/ED precautions.  Likely provoked in the setting of pna/pneumonitis. She will need at least 3 months of therapy but given her history, she is high risk for recurrence so may need lifelong therapy. Will defer to oncology, who is currently managing this. Advised her to discuss with Dr. Marin Olp about alternative oral anticoagulation therapy and if this would be appropriate option for her. Aware of red flag symptoms and ED precautions while on therapy.   05/05/2022: Today - follow up Patient presents today for follow up with her husband. She has been feeling well since she was here last. Her breathing is stable. She feels like she can complete ADLs without any issues. No significant cough or chest congestion. She tells me she feels back to her normal. She was a little confused about her oxygen therapy so she has still been wearing it during the day and at night for the most part. She did complete ONO but she is not sure if she did it on oxygen or off. She ran out of her lovenox last week so has been off of this. They  did not recall previous discussions of having to remain on it for a minimum of 3 months. Denies any hemoptysis, calf pain/swelling, dizziness/lightheadedness, or chest discomfort/pain.   Allergies  Allergen Reactions   Lyrica [Pregabalin] Other (See Comments)    Hallucinations    Crestor [Rosuvastatin  Calcium] Other (See Comments)    Leg cramps    Immunization History  Administered Date(s) Administered   DTaP 01/24/1979   Fluad Quad(high Dose 65+) 12/31/2018, 01/24/2020, 01/10/2021, 01/30/2022   Influenza Whole 01/22/2010, 02/05/2012   Influenza, High Dose Seasonal PF 01/29/2017, 02/14/2018   Influenza,inj,Quad PF,6+ Mos 02/19/2014   Influenza-Unspecified 02/19/2014, 01/17/2015, 01/28/2016   Janssen (J&J) SARS-COV-2 Vaccination 07/07/2019   Moderna SARS-COV2 Booster Vaccination 09/04/2020   Pneumococcal Conjugate-13 07/08/2015   Pneumococcal Polysaccharide-23 01/05/2009, 02/19/2014   Td 01/24/2007   Zoster, Live 02/16/2012    Past Medical History:  Diagnosis Date   Arthritis    Cancer ([place])    Diabetes mellitus    Esophagitis    Goals of care, counseling/discussion 05/23/2021   Hiatal hernia    History of radiation therapy    Left Lung- 11/27/21-01/08/22- Dr. 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[place]) 06/06/2021   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[place]) 06/06/2021   Small cell lung cancer, left upper lobe ([place]) 06/06/2021   Thyroid disease    TMJ (sprain of temporomandibular joint)     Tobacco History: Social History   Tobacco Use  Smoking Status Former   Years: 48.00   Types: Cigarettes   Quit date: 02/01/2017   Years since quitting: 5.2  Smokeless Tobacco Never   Counseling given: Not Answered   Outpatient Medications Prior to Visit  Medication Sig Dispense Refill   acetaminophen (TYLENOL) 325 MG tablet Take 1 tablet (325 mg total) by mouth every 6 (six) hours as needed for moderate pain.     albuterol (VENTOLIN HFA) 108 (90 Base) MCG/ACT inhaler Inhale 2 puffs into the lungs every 6 (six) hours as needed for wheezing or shortness of breath. 8 g 2   ALPRAZolam (XANAX) 0.5 MG  tablet TAKE ONE TABLET BY MOUTH THREE TIMES A DAY AS NEEDED (Patient taking differently: Take 0.5 mg by mouth 3 (three) times daily as needed for anxiety.) 90 tablet 0   blood glucose meter kit and supplies KIT Use daily 1 each 0   Blood Glucose Monitoring Suppl (ONE TOUCH ULTRA 2) w/Device KIT Check blood sugar as directed. Dx:E11.65 1 kit 0   Blood Glucose Monitoring Suppl (ONETOUCH VERIO FLEX SYSTEM) w/Device KIT USE AS DIRECTED TO CHECK BLOOD SUGAR FOUR TIMES A DAY 1 kit 0   dexamethasone (DECADRON) 4 MG tablet Take 2 tablets (8 mg total) by mouth daily. Start the day after chemotherapy for 2 days. Take with food. 30 tablet 1   FLUoxetine (PROZAC) 20 MG capsule Take 1 capsule (20 mg total) by mouth daily. 90 capsule 3   glucose blood (ONETOUCH VERIO) test strip USE ONE STRIP TO TEST DAILY 100 strip 1   insulin glargine (LANTUS SOLOSTAR) 100 UNIT/ML Solostar Pen Inject 30 Units into the skin daily. HS     Insulin Pen Needle (NOVOTWIST) 32G X 5 MM MISC Use daily at Bedtime with Lantus 100 each 0   ipratropium-albuterol (DUONEB) 0.5-2.5 (  3) MG/3ML SOLN Take 3 mLs by nebulization every 6 (six) hours as needed. 360 mL 3   levothyroxine (SYNTHROID) 125 MCG tablet Take 1 tablet (125 mcg total) by mouth daily before breakfast. 90 tablet 3   lidocaine-prilocaine (EMLA) cream Apply 1 Application topically as needed. 30 g 0   meclizine (ANTIVERT) 12.5 MG tablet Take 1 tablet (12.5 mg total) by mouth every 6 (six) hours as needed for dizziness. 30 tablet 0   meloxicam (MOBIC) 15 MG tablet TAKE 1 TABLET BY MOUTH DAILY 30 tablet 2   metFORMIN (GLUCOPHAGE-XR) 500 MG 24 hr tablet Take 1 tablet (500 mg total) by mouth in the morning and at bedtime. 180 tablet 0   omeprazole (PRILOSEC) 20 MG capsule Take 1 capsule (20 mg total) by mouth daily. (Patient taking differently: Take 20 mg by mouth daily as needed (indigestion).) 90 capsule 3   oxyCODONE ER (XTAMPZA ER) 13.5 MG C12A Take 13.5 mg by mouth 2 (two) times  daily. (Patient taking differently: Take 13.5 mg by mouth 2 (two) times daily as needed (pain).) 60 capsule 0   oxyCODONE-acetaminophen (PERCOCET) 7.5-325 MG tablet Take 1 tablet by mouth every 6 (six) hours as needed for severe pain. 90 tablet 0   TRULICITY 5.03 TW/6.5KC SOPN Inject 0.75 mg into the skin every Saturday.     UNABLE TO FIND Please provide hair prothesis for patient undergoing chemotherapy for cancer diagnosis. 1 Units 0   valACYclovir (VALTREX) 1000 MG tablet TAKE ONE TABLET BY MOUTH DAILY AS NEEDED (Patient taking differently: Take 500 mg by mouth daily as needed (prevent outbreaks).) 30 tablet 0   enoxaparin (LOVENOX) 80 MG/0.8ML injection Inject 0.7 mLs (70 mg total) into the skin daily. 21 mL 3   Facility-Administered Medications Prior to Visit  Medication Dose Route Frequency Provider Last Rate Last Admin   ipratropium-albuterol (DUONEB) 0.5-2.5 (3) MG/3ML nebulizer solution 3 mL  3 mL Nebulization Q6H Lowne Chase, Yvonne R, DO       sodium chloride flush (NS) 0.9 % injection 10 mL  10 mL Intravenous PRN Celso Amy, NP   10 mL at 08/18/21 1275     Review of Systems:   Constitutional: No weight loss or gain, night sweats, fevers, chills, +fatigue (baseline), or lassitude. HEENT: No headaches, difficulty swallowing, tooth/dental problems, or sore throat. No sneezing, itching, ear ache, nasal congestion, or post nasal drip CV:  No chest pain, orthopnea, PND, swelling in lower extremities, anasarca, dizziness, palpitations, syncope Resp: No shortness of breath with exertion or at rest. No cough. No excess mucus or change in color of mucus. No hemoptysis. No wheezing.  No chest wall deformity GI:  No heartburn, indigestion, abdominal pain, nausea, vomiting, diarrhea, change in bowel habits, loss of appetite, bloody stools.  Skin: No rash, lesions, ulcerations, bruising  MSK:  No joint pain or swelling.   Neuro: No dizziness or lightheadedness.  Psych: No depression or  anxiety. Mood stable.     Physical Exam:  BP 120/74 (BP Location: Right Arm, Patient Position: Sitting, Cuff Size: Normal)   Pulse (!) 123   Temp 98.1 F (36.7 C) (Oral)   Ht 4\' 11"  (1.499 m)   Wt 112 lb 9.6 oz (51.1 kg)   SpO2 99%   BMI 22.74 kg/m   GEN: Pleasant, interactive, well-kempt; in no acute distress. HEENT:  Normocephalic and atraumatic. PERRLA. Sclera white. Nasal turbinates pink, moist and patent bilaterally. No rhinorrhea present. Oropharynx pink and moist, without exudate or edema. No  lesions, ulcerations, or postnasal drip.  NECK:  Supple w/ fair ROM. No JVD present. Normal carotid impulses w/o bruits. Thyroid symmetrical with no goiter or nodules palpated. No lymphadenopathy.   CV: Tachycardic, regular rhythm, no m/r/g, no peripheral edema. Pulses intact, +2 bilaterally. No cyanosis, pallor or clubbing. PULMONARY:  Unlabored, regular breathing. Clear bilaterally A&P w/o wheezes/rales/rhonchi. No accessory muscle use.  GI: BS present and normoactive. Soft, non-tender to palpation. No organomegaly or masses detected.  MSK: No erythema, warmth or tenderness. Cap refil <2 sec all extrem. No deformities or joint swelling noted.  Neuro: A/Ox3. No focal deficits noted.   Skin: Warm, no lesions or rashe Psych: Normal affect and behavior. Judgement and thought content appropriate.     Lab Results:  CBC    Component Value Date/Time   WBC 6.1 04/15/2022 0915   WBC 11.0 (H) 03/11/2022 0502   RBC 3.60 (L) 04/15/2022 0915   HGB 11.0 (L) 04/15/2022 0915   HCT 33.7 (L) 04/15/2022 0915   PLT 194 04/15/2022 0915   MCV 93.6 04/15/2022 0915   MCH 30.6 04/15/2022 0915   MCHC 32.6 04/15/2022 0915   RDW 13.6 04/15/2022 0915   LYMPHSABS 0.7 04/15/2022 0915   MONOABS 0.7 04/15/2022 0915   EOSABS 0.2 04/15/2022 0915   BASOSABS 0.0 04/15/2022 0915    BMET    Component Value Date/Time   NA 134 (L) 04/15/2022 0915   K 4.2 04/15/2022 0915   CL 100 04/15/2022 0915   CO2 27  04/15/2022 0915   GLUCOSE 116 (H) 04/15/2022 0915   BUN 11 04/15/2022 0915   CREATININE 0.59 04/15/2022 0915   CREATININE 0.88 05/08/2021 1126   CALCIUM 9.3 04/15/2022 0915   GFRNONAA >60 04/15/2022 0915   GFRAA >60 10/16/2018 2157    BNP    Component Value Date/Time   BNP 35.1 03/01/2022 0500     Imaging:  DG Chest 2 View  Result Date: 05/05/2022 CLINICAL DATA:  History of cough. History of small cell lung cancer. EXAM: CHEST - 2 VIEW COMPARISON:  PET-CT 04/02/2022.  Chest x-ray 03/11/2022. FINDINGS: PowerPort catheter in stable position. Heart size normal. Persistent left infrahilar infiltrate without significant change. Small left pleural effusion cannot be excluded. Left-sided pleuroparenchymal thickening consistent scarring. No pneumothorax. Deformity again noted of the right anterior fourth rib, possibly secondary to metastatic disease. Reference made to prior recent PET-CT report. Prior cervicothoracic fusion. Degenerative changes scoliosis thoracic spine. Surgical clips right upper quadrant. IMPRESSION: 1. Persistent left infrahilar infiltrate without significant change. Small left pleural effusion cannot be excluded. 2. Deformity again noted of the right anterior fourth rib, possibly secondary to metastatic disease. Reference made to prior recent PET-CT report of 04/02/2022. Electronically Signed   By: Marcello Moores  Register M.D.   On: 05/05/2022 09:24   MR Brain W Wo Contrast  Result Date: 04/23/2022 CLINICAL DATA:  Small-cell lung cancer. EXAM: MRI HEAD WITHOUT AND WITH CONTRAST TECHNIQUE: Multiplanar, multiecho pulse sequences of the brain and surrounding structures were obtained without and with intravenous contrast. CONTRAST:  5 cc Vueway COMPARISON:  Brain MRI 04/09/2021 FINDINGS: Brain: Again seen is ill-defined curvilinear enhancement in the right cerebellar hemisphere measuring up to 0.7 cm (13-33). There is some intrinsic T1 hyperintensity in this region but there does appear to  be some enhancement. There is an additional 5 mm focus of enhancement in the right cerebellar hemisphere more superolaterally, not definitely seen on the prior study, though this is likely due to differences in technique (13-43). These  enhancing foci are not well seen on the coronal and sagittal postcontrast images due to motion degradation. There is mild associated FLAIR signal abnormality without regional mass effect. There are no other enhancing lesions. There is no acute intracranial hemorrhage, extra-axial fluid collection, or acute infarct Parenchymal volume is stable. The ventricles are stable in size. 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 The pituitary and suprasellar region are normal. There is no midline shift. Vascular: Normal flow voids. Skull and upper cervical spine: Postsurgical changes are noted in the upper cervical spine. Enhancing metastatic lesions are again seen in the left frontal bone (13-140) and occipital bone at the midline (13-61). Sinuses/Orbits: The paranasal sinuses are clear. The globes and orbits are unremarkable. Other: None. IMPRESSION: 1. Two small ill-defined areas of enhancement in the right cerebellar hemisphere suspicious for metastatic disease. 2. Unchanged calvarial or metastatic lesions. Electronically Signed   By: Valetta Mole M.D.   On: 04/23/2022 09:41   MR Brain W Wo Contrast  Result Date: 04/09/2022 CLINICAL DATA:  Small cell lung cancer. Staging for metastatic disease EXAM: MRI HEAD WITHOUT AND WITH CONTRAST TECHNIQUE: Multiplanar, multiecho pulse sequences of the brain and surrounding structures were obtained without and with intravenous contrast. CONTRAST:  70mL GADAVIST GADOBUTROL 1 MMOL/ML IV SOLN COMPARISON:  MRI head 11/10/2021 FINDINGS: Brain: New area of enhancement in the right posterior cerebellum measuring proximally 3 x 7 mm. Mild hyperintensity on  FLAIR which was not present previously. Findings compatible with metastatic disease. No other enhancing lesions in the brain. Ventricle size normal. Patchy white matter hypodensity bilaterally. No acute infarct. No intracranial hemorrhage Vascular: Normal arterial flow voids Skull and upper cervical spine: New enhancing lesions in the calvarium compatible with metastatic disease. There is an 8 mm lesion in the left frontal bone and a 10 mm lesion in the occipital bone in the midline. There may also be a third mildly enhancing lesion in the left frontal bone on axial image 107. Anterior and posterior cervical spine fusion with hardware. Sinuses/Orbits: Paranasal sinuses clear.  Negative orbit Other: None IMPRESSION: 1. Interval development of 3 x 7 mm enhancing lesion right posterior cerebellum compatible with metastatic disease. No other enhancing lesions in the brain. 2. New enhancing lesions in the calvarium compatible with metastatic disease. Electronically Signed   By: Franchot Gallo M.D.   On: 04/09/2022 16:54    denosumab (XGEVA) injection 120 mg     Date Action Dose Route User   04/15/2022 1155 Given 120 mg Subcutaneous (Right Lower Abdomen) Tildon Husky P, RN      dexamethasone (DECADRON) 10 mg in sodium chloride 0.9 % 50 mL IVPB     Date Action Dose Route User   04/15/2022 1007 Rate/Dose Change (none) Intravenous Johny Drilling, RN   04/15/2022 1006 New Bag/Given 10 mg Intravenous Johny Drilling, RN      heparin lock flush 100 unit/mL     Date Action Dose Route User   04/08/2022 1537 Given 500 Units Intravenous Amelia Jo I, RN      heparin lock flush 100 unit/mL     Date Action Dose Route User   04/15/2022 1200 Given 500 Units Intracatheter Harris, Donnica P, RN      lurbinectedin (ZEPZELCA) 4 mg in sodium chloride 0.9 % 250 mL chemo infusion     Date Action Dose Route User   04/15/2022 1129 Rate/Dose Change (none) Intravenous Tildon Husky  P, RN   04/15/2022 1058  Rate/Dose Change (none) Intravenous Johny Drilling, RN   04/15/2022 1057 New Bag/Given 4 mg Intravenous [place] More[place], RN      palonosetron (ALOXI) injection 0.25 mg     Date Action Dose Route User   04/15/2022 1003 Given 0.25 mg Intravenous Harris, Donnica P, RN      0.9 %  sodium chloride infusion     Date Action Dose Route User   04/15/2022 1200 Rate/Dose Change (none) Intravenous Johny Drilling, RN   04/15/2022 1156 Rate/Dose Change (none) Intravenous Johny Drilling, RN   04/15/2022 1058 Rate/Dose Change (none) Intravenous Johny Drilling, RN   04/15/2022 1051 Rate/Dose Change (none) Intravenous Johny Drilling, RN   04/15/2022 1051 Rate/Dose Change (none) Intravenous Harris, Donnica P, RN      sodium chloride flush (NS) 0.9 % injection 10 mL     Date Action Dose Route User   04/08/2022 1537 Given 10 mL Intravenous Amelia Jo I, RN      sodium chloride flush (NS) 0.9 % injection 10 mL     Date Action Dose Route User   04/15/2022 1200 Given 10 mL Intracatheter Harris, Donnica P, RN           No data to display          No results found for: "NITRICOXIDE"      Assessment & Plan:   Multifocal pneumonia vs radiation induced pneumonitis Clinically improved. She has completed abx and steroid courses. CXR today with significant improvement; persistent left hilar opacity, which is consistent with know malignant disease and present since CT/PET in February 2023. We will continue to monitor her symptoms moving forward. Strict return precautions.   Patient Instructions  Continue Albuterol inhaler 2 puffs or duoneb every 6 hours as needed for shortness of breath or wheezing. Notify if symptoms persist despite rescue inhaler/neb use. Continue to monitor your oxygen at home for goal >88-90%. Ok to stop using your supplemental oxygen during the day.  We may repeat oxygen study overnight to ensure you don't need to continue to use your oxygen at bedtime. I will  call you about instructions on this.   Restart your lovenox injections today. You need this for a minimum of 3 months from your hospital stay. There is a high likelihood you will remain on this lifelong but you can discuss with Dr. Marin Olp at your appointment on Thursday.    If you develop worsening shortness of breath, chest discomfort or decreased oxygen levels, please go to the ED   Follow up in 3 months with Dr. Lamonte Sakai. If symptoms do not improve or worsen, please contact office for sooner follow up or seek emergency care.    Acute respiratory failure with hypoxia (HCC) Resolved. Walking oximetry completed again today without desaturation on room air. Advised she could discontinue daytime oxygen at this point. She will monitor saturations at home for goal >88-90%. She did have ONO; results requested from DME. She is unsure if she completed this on room air or on her oxygen. Given this, we will likely need to repeat her study either way but will wait on results to determine next steps.   Pulmonary embolism ([place]) Likely provoked in the setting of pna/pneumonitis. She will need at least 3 months of therapy but given her history, she is high risk for recurrence so may need lifelong therapy. We discussed this at her last OV. Unfortunately, they have stopped therapy and did  not realize she had refills on file. I am concerned that is the contributing factor to her tachycardia today. Fortunately, she is stable from a symptom standpoint and not currently having any new O2 requirements. Discussed with Dr. Chase Caller. Given her stability, we will plan to restart her lovenox injections. Educated patient and her spouse on the importance of compliance with her anticoagulation therapy. Medication education provided and again reviewed red flag findings with therapy. Verbalized understanding. I have also sent a message to Dr. Marin Olp, as they are the ones who manage her therapy, so they can discuss alternative, more  cost affordable options, if possible. Strict ED precautions provided to pt in interim.   Small cell lung cancer, left upper lobe (HCC) Extensive disease. Mets to liver, bone, lymph nodes, brain. Follow up with oncology as scheduled.      I spent 42 minutes of dedicated to the care of this patient on the date of this encounter to include pre-visit review of records, face-to-face time with the patient discussing conditions above, post visit ordering of testing, clinical documentation with the electronic health record, making appropriate referrals as documented, and communicating necessary findings to members of the patients care team.  Clayton Bibles, NP 05/05/2022  Pt aware and understands NP's role.

## 2022-05-06 ENCOUNTER — Other Ambulatory Visit: Payer: Self-pay | Admitting: Radiation Therapy

## 2022-05-06 DIAGNOSIS — C7931 Secondary malignant neoplasm of brain: Secondary | ICD-10-CM

## 2022-05-07 ENCOUNTER — Other Ambulatory Visit: Payer: Self-pay | Admitting: *Deleted

## 2022-05-07 ENCOUNTER — Encounter: Payer: Self-pay | Admitting: Nurse Practitioner

## 2022-05-07 ENCOUNTER — Other Ambulatory Visit: Payer: Self-pay

## 2022-05-07 ENCOUNTER — Inpatient Hospital Stay: Payer: PPO | Attending: Hematology & Oncology | Admitting: Hematology & Oncology

## 2022-05-07 ENCOUNTER — Encounter: Payer: Self-pay | Admitting: Hematology & Oncology

## 2022-05-07 ENCOUNTER — Inpatient Hospital Stay: Payer: PPO

## 2022-05-07 ENCOUNTER — Other Ambulatory Visit (HOSPITAL_BASED_OUTPATIENT_CLINIC_OR_DEPARTMENT_OTHER): Payer: Self-pay

## 2022-05-07 ENCOUNTER — Encounter: Payer: Self-pay | Admitting: *Deleted

## 2022-05-07 VITALS — BP 128/85 | HR 103

## 2022-05-07 VITALS — BP 103/69 | HR 114 | Temp 97.6°F | Resp 18 | Ht 59.0 in | Wt 111.8 lb

## 2022-05-07 DIAGNOSIS — Z79630 Long term (current) use of alkylating agent: Secondary | ICD-10-CM | POA: Diagnosis not present

## 2022-05-07 DIAGNOSIS — I2693 Single subsegmental pulmonary embolism without acute cor pulmonale: Secondary | ICD-10-CM | POA: Diagnosis not present

## 2022-05-07 DIAGNOSIS — C3412 Malignant neoplasm of upper lobe, left bronchus or lung: Secondary | ICD-10-CM | POA: Diagnosis not present

## 2022-05-07 DIAGNOSIS — I2782 Chronic pulmonary embolism: Secondary | ICD-10-CM

## 2022-05-07 DIAGNOSIS — R002 Palpitations: Secondary | ICD-10-CM | POA: Diagnosis not present

## 2022-05-07 DIAGNOSIS — Z5111 Encounter for antineoplastic chemotherapy: Secondary | ICD-10-CM | POA: Insufficient documentation

## 2022-05-07 DIAGNOSIS — Z9221 Personal history of antineoplastic chemotherapy: Secondary | ICD-10-CM | POA: Diagnosis not present

## 2022-05-07 DIAGNOSIS — R519 Headache, unspecified: Secondary | ICD-10-CM | POA: Diagnosis not present

## 2022-05-07 DIAGNOSIS — Z7901 Long term (current) use of anticoagulants: Secondary | ICD-10-CM | POA: Insufficient documentation

## 2022-05-07 DIAGNOSIS — M545 Low back pain, unspecified: Secondary | ICD-10-CM | POA: Insufficient documentation

## 2022-05-07 DIAGNOSIS — I2609 Other pulmonary embolism with acute cor pulmonale: Secondary | ICD-10-CM | POA: Diagnosis not present

## 2022-05-07 DIAGNOSIS — R0602 Shortness of breath: Secondary | ICD-10-CM | POA: Insufficient documentation

## 2022-05-07 DIAGNOSIS — Z79899 Other long term (current) drug therapy: Secondary | ICD-10-CM | POA: Diagnosis not present

## 2022-05-07 DIAGNOSIS — R059 Cough, unspecified: Secondary | ICD-10-CM | POA: Insufficient documentation

## 2022-05-07 DIAGNOSIS — C787 Secondary malignant neoplasm of liver and intrahepatic bile duct: Secondary | ICD-10-CM | POA: Diagnosis not present

## 2022-05-07 DIAGNOSIS — Z923 Personal history of irradiation: Secondary | ICD-10-CM | POA: Insufficient documentation

## 2022-05-07 DIAGNOSIS — R5383 Other fatigue: Secondary | ICD-10-CM | POA: Diagnosis not present

## 2022-05-07 DIAGNOSIS — C3490 Malignant neoplasm of unspecified part of unspecified bronchus or lung: Secondary | ICD-10-CM | POA: Diagnosis not present

## 2022-05-07 LAB — CBC WITH DIFFERENTIAL (CANCER CENTER ONLY)
Abs Immature Granulocytes: 0.04 10*3/uL (ref 0.00–0.07)
Basophils Absolute: 0 10*3/uL (ref 0.0–0.1)
Basophils Relative: 0 %
Eosinophils Absolute: 0.1 10*3/uL (ref 0.0–0.5)
Eosinophils Relative: 1 %
HCT: 36.1 % (ref 36.0–46.0)
Hemoglobin: 12 g/dL (ref 12.0–15.0)
Immature Granulocytes: 1 %
Lymphocytes Relative: 12 %
Lymphs Abs: 0.8 10*3/uL (ref 0.7–4.0)
MCH: 31.1 pg (ref 26.0–34.0)
MCHC: 33.2 g/dL (ref 30.0–36.0)
MCV: 93.5 fL (ref 80.0–100.0)
Monocytes Absolute: 0.8 10*3/uL (ref 0.1–1.0)
Monocytes Relative: 14 %
Neutro Abs: 4.4 10*3/uL (ref 1.7–7.7)
Neutrophils Relative %: 72 %
Platelet Count: 205 10*3/uL (ref 150–400)
RBC: 3.86 MIL/uL — ABNORMAL LOW (ref 3.87–5.11)
RDW: 14 % (ref 11.5–15.5)
WBC Count: 6.2 10*3/uL (ref 4.0–10.5)
nRBC: 0 % (ref 0.0–0.2)

## 2022-05-07 LAB — CMP (CANCER CENTER ONLY)
ALT: 9 U/L (ref 0–44)
AST: 13 U/L — ABNORMAL LOW (ref 15–41)
Albumin: 4 g/dL (ref 3.5–5.0)
Alkaline Phosphatase: 41 U/L (ref 38–126)
Anion gap: 9 (ref 5–15)
BUN: 14 mg/dL (ref 8–23)
CO2: 29 mmol/L (ref 22–32)
Calcium: 9.9 mg/dL (ref 8.9–10.3)
Chloride: 102 mmol/L (ref 98–111)
Creatinine: 0.63 mg/dL (ref 0.44–1.00)
GFR, Estimated: >60 mL/min (ref >60)
Glucose, Bld: 187 mg/dL — ABNORMAL HIGH (ref 70–99)
Potassium: 3.5 mmol/L (ref 3.5–5.1)
Sodium: 140 mmol/L (ref 135–145)
Total Bilirubin: 0.3 mg/dL (ref 0.3–1.2)
Total Protein: 7.4 g/dL (ref 6.5–8.1)

## 2022-05-07 LAB — LACTATE DEHYDROGENASE: LDH: 192 U/L (ref 98–192)

## 2022-05-07 MED ORDER — APIXABAN 5 MG PO TABS: 5.0000 mg | ORAL_TABLET | Freq: Two times a day (BID) | ORAL | 0 refills | Status: DC

## 2022-05-07 MED ORDER — ENOXAPARIN SODIUM 80 MG/0.8ML IJ SOSY: 70.0000 mg | PREFILLED_SYRINGE | Freq: Every day | INTRAMUSCULAR | 3 refills | Status: DC

## 2022-05-07 MED ORDER — SODIUM CHLORIDE 0.9 % IV SOLN
4.0000 mg | Freq: Once | INTRAVENOUS | Status: AC
  Administered 2022-05-07: 4 mg via INTRAVENOUS
  Filled 2022-05-07: qty 8

## 2022-05-07 MED ORDER — SODIUM CHLORIDE 0.9% FLUSH
10.0000 mL | Freq: Once | INTRAVENOUS | Status: AC
  Administered 2022-05-07: 10 mL

## 2022-05-07 MED ORDER — PALONOSETRON HCL INJECTION 0.25 MG/5ML
0.2500 mg | Freq: Once | INTRAVENOUS | Status: AC
  Administered 2022-05-07: 0.25 mg via INTRAVENOUS
  Filled 2022-05-07: qty 5

## 2022-05-07 MED ORDER — SODIUM CHLORIDE 0.9 % IV SOLN
10.0000 mg | Freq: Once | INTRAVENOUS | Status: AC
  Administered 2022-05-07: 10 mg via INTRAVENOUS
  Filled 2022-05-07: qty 10

## 2022-05-07 MED ORDER — APIXABAN 5 MG PO TABS
5.0000 mg | ORAL_TABLET | Freq: Two times a day (BID) | ORAL | 0 refills | Status: DC
  Filled 2022-05-07: qty 60, 30d supply, fill #0

## 2022-05-07 MED ORDER — OXYCODONE-ACETAMINOPHEN 7.5-325 MG PO TABS: 1.0000 | ORAL_TABLET | Freq: Four times a day (QID) | ORAL | 0 refills | Status: DC | PRN

## 2022-05-07 MED ORDER — HEPARIN SOD (PORK) LOCK FLUSH 100 UNIT/ML IV SOLN
500.0000 [IU] | Freq: Once | INTRAVENOUS | Status: AC
  Administered 2022-05-07: 500 [IU] via INTRAVENOUS

## 2022-05-07 MED ORDER — SODIUM CHLORIDE 0.9 % IV SOLN: Freq: Once | INTRAVENOUS | Status: AC

## 2022-05-07 NOTE — Progress Notes (Signed)
Ok to treat with HR of 114 per Dr Marin Olp. dph

## 2022-05-07 NOTE — Progress Notes (Signed)
Patient cleared to receive cycle two of treatment. After her last cycle patient had SRS to her know brain mets.   Oncology Nurse Navigator Documentation     05/07/2022    9:30 AM  Oncology Nurse Navigator Flowsheets  Phase of Treatment Radiation  Radiation Actual Start Date: 04/29/2022  Radiation Actual End Date: 04/29/2022  Navigator Follow Up Date: 05/29/2022  Navigator Follow Up Reason: Follow-up Appointment;Chemotherapy  Navigator Location [facility]  Navigator Encounter Type Treatment;Appt/Treatment Plan Review  Patient Visit Type MedOnc  Treatment Phase Active Tx  Barriers/Navigation Needs Coordination of Care;Education  Interventions Psycho-Social Support  Acuity Level 2-Minimal Needs (1-2 Barriers Identified)  Support Groups/Services Friends and Family  Time Spent with Patient 15

## 2022-05-07 NOTE — Patient Instructions (Signed)

## 2022-05-07 NOTE — Progress Notes (Signed)
Hematology and Oncology Follow Up Visit  KHARISMA GLASNER [id number] February 03, 1951 72 y.o. 05/07/2022   Principle Diagnosis:  Small cell lung cancer -- Extensive stage Pulmonary embolism -right upper lobe segmental/subsegmental   Current Therapy:        Carbo/VP-16/Tecentriq -- s/p cycle 6 -- start on 06/11/2021 Xgeva 120 mg subcu every 3 months -- next dose on 07/2022 Radiation therapy to the chest-completed 01/06/2022 Lovenox 80 mg SQ daily --started on 03/10/2022 Lurbinectiden -- s/p cycle #2 on 04/15/2022 S/p SBRT for CNS mets -- completed on 04/29/2022   Interim History:  Ms. Tullo is here today for follow-up.  She is doing pretty well.  She did have a tough time with the radiosurgery.  This was completed about a week or so ago.  She did well with the lurbinectedin.  She really had very little with toxicity from this.  She has had no nausea or vomiting.  She has had some pain issues.  This is because of her metastatic disease.  However, the pain seems to be under fairly decent control.  She has had no cough.  She is off oxygen except for nighttime.  I think this is a good sign for [place].  She has had no fever.  She has had no bleeding.  There has been no problems with bowels or bladder.  She has had no incontinence.  There is been no rashes.  She has a little bit of a headache.  I told her that it was okay for her to take Tylenol if she needed to.    She is on Lovenox for the pulmonary embolism.  She would like to get off the Lovenox.  Lovenox is pretty expensive for her.  I will see what we try to do to get her on something oral.  Currently, I would say that her performance status is probably ECOG 1.    Medications:  Allergies as of 05/07/2022       Reactions   Lyrica [pregabalin] Other (See Comments)   Hallucinations    Crestor [rosuvastatin Calcium] Other (See Comments)   Leg cramps        Medication List        Accurate as of May 07, 2022 10:09 AM. If you have any  questions, ask your nurse or doctor.          acetaminophen 325 MG tablet Commonly known as: TYLENOL Take 1 tablet (325 mg total) by mouth every 6 (six) hours as needed for moderate pain.   albuterol 108 (90 Base) MCG/ACT inhaler Commonly known as: VENTOLIN HFA Inhale 2 puffs into the lungs every 6 (six) hours as needed for wheezing or shortness of breath.   ALPRAZolam 0.5 MG tablet Commonly known as: XANAX TAKE ONE TABLET BY MOUTH THREE TIMES A DAY AS NEEDED What changed: reasons to take this   blood glucose meter kit and supplies Kit Use daily   dexamethasone 4 MG tablet Commonly known as: DECADRON Take 2 tablets (8 mg total) by mouth daily. Start the day after chemotherapy for 2 days. Take with food.   enoxaparin 80 MG/0.8ML injection Commonly known as: LOVENOX Inject 0.7 mLs (70 mg total) into the skin daily.   FLUoxetine 20 MG capsule Commonly known as: PROZAC Take 1 capsule (20 mg total) by mouth daily.   Insulin Pen Needle 32G X 5 MM Misc Commonly known as: NovoTwist Use daily at Bedtime with Lantus   ipratropium-albuterol 0.5-2.5 (3) MG/3ML Soln Commonly known as: DUONEB  Take 3 mLs by nebulization every 6 (six) hours as needed.   Lantus SoloStar 100 UNIT/ML Solostar Pen Generic drug: insulin glargine Inject 30 Units into the skin daily. HS   levothyroxine 125 MCG tablet Commonly known as: Synthroid Take 1 tablet (125 mcg total) by mouth daily before breakfast.   Levothyroxine Sodium 125 MCG Caps Take 125 mcg by mouth daily.   lidocaine-prilocaine cream Commonly known as: EMLA Apply 1 Application topically as needed.   meclizine 12.5 MG tablet Commonly known as: ANTIVERT Take 1 tablet (12.5 mg total) by mouth every 6 (six) hours as needed for dizziness.   meloxicam 15 MG tablet Commonly known as: MOBIC TAKE 1 TABLET BY MOUTH DAILY   metFORMIN 500 MG 24 hr tablet Commonly known as: GLUCOPHAGE-XR Take 1 tablet (500 mg total) by mouth in the  morning and at bedtime.   omeprazole 20 MG capsule Commonly known as: PRILOSEC Take 1 capsule (20 mg total) by mouth daily. What changed:  when to take this reasons to take this   ONE TOUCH ULTRA 2 w/Device Kit Check blood sugar as directed. Dx:E11.65   OneTouch Verio Flex System w/Device Kit USE AS DIRECTED TO CHECK BLOOD SUGAR FOUR TIMES A DAY   OneTouch Verio test strip Generic drug: glucose blood USE ONE STRIP TO TEST DAILY   oxyCODONE-acetaminophen 7.5-325 MG tablet Commonly known as: Percocet Take 1 tablet by mouth every 6 (six) hours as needed for severe pain.   Trulicity 9.16 BW/4.6KZ Sopn Generic drug: Dulaglutide Inject 0.75 mg into the skin every Saturday.   UNABLE TO FIND Please provide hair prothesis for patient undergoing chemotherapy for cancer diagnosis.   valACYclovir 1000 MG tablet Commonly known as: VALTREX TAKE ONE TABLET BY MOUTH DAILY AS NEEDED What changed:  how much to take reasons to take this   Xtampza ER 13.5 MG C12a Generic drug: oxyCODONE ER Take 13.5 mg by mouth 2 (two) times daily. What changed:  when to take this reasons to take this        Allergies:  Allergies  Allergen Reactions   Lyrica [Pregabalin] Other (See Comments)    Hallucinations    Crestor [Rosuvastatin Calcium] Other (See Comments)    Leg cramps    Past Medical History, Surgical history, Social history, and Family History were reviewed and updated.  Review of Systems: Review of Systems  Constitutional:  Positive for malaise/fatigue.  HENT: Negative.    Eyes: Negative.   Respiratory:  Positive for cough and shortness of breath.   Cardiovascular:  Positive for palpitations.  Gastrointestinal: Negative.   Genitourinary: Negative.   Musculoskeletal: Negative.   Skin: Negative.   Neurological: Negative.   Endo/Heme/Allergies: Negative.   Psychiatric/Behavioral: Negative.       Physical Exam:  height is 4\' 11"  (1.499 m) and weight is 111 lb 12.8 oz  (50.7 kg). Her oral temperature is 97.6 F (36.4 C). Her blood pressure is 103/69 and her pulse is 114 (abnormal). Her respiration is 18 and oxygen saturation is 91%.   Wt Readings from Last 3 Encounters:  05/07/22 111 lb 12.8 oz (50.7 kg)  05/05/22 112 lb 9.6 oz (51.1 kg)  04/24/22 111 lb 12.8 oz (50.7 kg)   Physical Exam Vitals reviewed.  HENT:     Head: Normocephalic and atraumatic.  Eyes:     Pupils: Pupils are equal, round, and reactive to light.  Cardiovascular:     Rate and Rhythm: Normal rate and regular rhythm.     Heart  sounds: Normal heart sounds.  Pulmonary:     Effort: Pulmonary effort is normal.     Breath sounds: Normal breath sounds.  Abdominal:     General: Bowel sounds are normal.     Palpations: Abdomen is soft.  Musculoskeletal:        General: No tenderness or deformity. Normal range of motion.     Cervical back: Normal range of motion.  Lymphadenopathy:     Cervical: No cervical adenopathy.  Skin:    General: Skin is warm and dry.     Findings: No erythema or rash.  Neurological:     Mental Status: She is alert and oriented to person, place, and time.  Psychiatric:        Behavior: Behavior normal.        Thought Content: Thought content normal.        Judgment: Judgment normal.     Lab Results  Component Value Date   WBC 6.2 05/07/2022   HGB 12.0 05/07/2022   HCT 36.1 05/07/2022   MCV 93.5 05/07/2022   PLT 205 05/07/2022   Lab Results  Component Value Date   FERRITIN 1,198 (H) 03/05/2022   IRON 90 03/05/2022   TIBC 239 (L) 03/05/2022   UIBC 149 03/05/2022   IRONPCTSAT 38 (H) 03/05/2022   Lab Results  Component Value Date   RETICCTPCT 3.6 (H) 03/05/2022   RBC 3.86 (L) 05/07/2022   No results found for: "KPAFRELGTCHN", "LAMBDASER", "KAPLAMBRATIO" No results found for: "IGGSERUM", "IGA", "IGMSERUM" No results found for: "TOTALPROTELP", "ALBUMINELP", "A1GS", "A2GS", "BETS", "BETA2SER", "GAMS", "MSPIKE", "SPEI"   Chemistry       Component Value Date/Time   NA 140 05/07/2022 0912   K 3.5 05/07/2022 0912   CL 102 05/07/2022 0912   CO2 29 05/07/2022 0912   BUN 14 05/07/2022 0912   CREATININE 0.63 05/07/2022 0912   CREATININE 0.88 05/08/2021 1126      Component Value Date/Time   CALCIUM 9.9 05/07/2022 0912   ALKPHOS 41 05/07/2022 0912   AST 13 (L) 05/07/2022 0912   ALT 9 05/07/2022 0912   BILITOT 0.3 05/07/2022 0912       Impression and Plan: Ms. Neu is a very pleasant 72 yo caucasian female with extensive stage small cell lung cancer.  She has had chemotherapy and immunotherapy.  She then had radiation therapy for residual disease.  Unfortunately, she was hospitalized with a pulmonary embolism.  She had radiation pneumonitis.  She is on oxygen.  She is on Lovenox.  Will see if we can try to switch her over to Eliquis.  I am not sure how much this will cost.  We will go ahead with the lurbinectedin today.  This will be her second cycle.  Hopefully, we will be seen a response.  I just want her quality of life to be as good as possible.  We will plan to get her back in 3 more weeks.  After this third cycle, then we will do her scans.    [name], MD 2/1/202410:09 AM

## 2022-05-07 NOTE — Progress Notes (Signed)
Patient with slight weight gain. Leave Zepzelca dose at 4 mg today per Dr. Marin Olp.

## 2022-05-08 ENCOUNTER — Other Ambulatory Visit (HOSPITAL_BASED_OUTPATIENT_CLINIC_OR_DEPARTMENT_OTHER): Payer: Self-pay

## 2022-05-08 ENCOUNTER — Encounter: Payer: Self-pay | Admitting: Hematology & Oncology

## 2022-05-08 ENCOUNTER — Other Ambulatory Visit: Payer: Self-pay

## 2022-05-11 NOTE — Progress Notes (Signed)
Papers for family member faxed to [place] and [facility] faxed with confirmation received.    Fax # [phone]

## 2022-05-15 ENCOUNTER — Telehealth: Payer: Self-pay | Admitting: Emergency Medicine

## 2022-05-15 DIAGNOSIS — G4733 Obstructive sleep apnea (adult) (pediatric): Secondary | ICD-10-CM

## 2022-05-15 NOTE — Telephone Encounter (Signed)
She needs to repeat ONO on room air. Results did not show significant desaturations but since she's not sure if she did it on room air or oxygen last time, we need to have her do it again. Please remind her to not wear her oxygen that night. Thanks.

## 2022-05-15 NOTE — Telephone Encounter (Signed)
Called pt again informed her of Katie's recommendation. Pt verbalized understanding. ONO ordered ONO on room air with CPAP. Nothing further needed.

## 2022-05-15 NOTE — Telephone Encounter (Signed)
I spoke to the pt and she states Kelly Diesel, NP had suggested the pt to continue to monitor her oxygen at home for goal >88-90%. And ok to stop using her supplemental oxygen during the day.  And [name] may repeat oxygen study overnight to ensure the pt does not need to continue to use her oxygen at bedtime. Kelly Diesel, NP said she will call pt about instructions on this. So, the pt was wondering when will Katie call. I informed pt I will send [name] a message. Pt verbalized understanding.

## 2022-05-18 DIAGNOSIS — J9601 Acute respiratory failure with hypoxia: Secondary | ICD-10-CM | POA: Diagnosis not present

## 2022-05-21 ENCOUNTER — Other Ambulatory Visit: Payer: Self-pay | Admitting: Family Medicine

## 2022-05-21 DIAGNOSIS — F419 Anxiety disorder, unspecified: Secondary | ICD-10-CM

## 2022-05-21 MED ORDER — ALPRAZOLAM 0.5 MG PO TABS: 0.5000 mg | ORAL_TABLET | Freq: Three times a day (TID) | ORAL | 1 refills | Status: DC | PRN

## 2022-05-29 ENCOUNTER — Inpatient Hospital Stay (HOSPITAL_BASED_OUTPATIENT_CLINIC_OR_DEPARTMENT_OTHER): Payer: PPO

## 2022-05-29 ENCOUNTER — Encounter: Payer: Self-pay | Admitting: *Deleted

## 2022-05-29 ENCOUNTER — Inpatient Hospital Stay: Payer: PPO

## 2022-05-29 ENCOUNTER — Inpatient Hospital Stay (HOSPITAL_BASED_OUTPATIENT_CLINIC_OR_DEPARTMENT_OTHER): Payer: PPO | Admitting: Hematology & Oncology

## 2022-05-29 ENCOUNTER — Encounter: Payer: Self-pay | Admitting: Hematology & Oncology

## 2022-05-29 DIAGNOSIS — C3412 Malignant neoplasm of upper lobe, left bronchus or lung: Secondary | ICD-10-CM

## 2022-05-29 DIAGNOSIS — Z5111 Encounter for antineoplastic chemotherapy: Secondary | ICD-10-CM | POA: Diagnosis not present

## 2022-05-29 DIAGNOSIS — C787 Secondary malignant neoplasm of liver and intrahepatic bile duct: Secondary | ICD-10-CM

## 2022-05-29 DIAGNOSIS — I2609 Other pulmonary embolism with acute cor pulmonale: Secondary | ICD-10-CM

## 2022-05-29 LAB — CBC WITH DIFFERENTIAL (CANCER CENTER ONLY)
Abs Immature Granulocytes: 0.02 10*3/uL (ref 0.00–0.07)
Basophils Absolute: 0 10*3/uL (ref 0.0–0.1)
Basophils Relative: 0 %
Eosinophils Absolute: 0.1 10*3/uL (ref 0.0–0.5)
Eosinophils Relative: 2 %
HCT: 35.7 % — ABNORMAL LOW (ref 36.0–46.0)
Hemoglobin: 11.6 g/dL — ABNORMAL LOW (ref 12.0–15.0)
Immature Granulocytes: 0 %
Lymphocytes Relative: 16 %
Lymphs Abs: 0.8 10*3/uL (ref 0.7–4.0)
MCH: 31.1 pg (ref 26.0–34.0)
MCHC: 32.5 g/dL (ref 30.0–36.0)
MCV: 95.7 fL (ref 80.0–100.0)
Monocytes Absolute: 0.7 10*3/uL (ref 0.1–1.0)
Monocytes Relative: 14 %
Neutro Abs: 3.4 10*3/uL (ref 1.7–7.7)
Neutrophils Relative %: 68 %
Platelet Count: 198 10*3/uL (ref 150–400)
RBC: 3.73 MIL/uL — ABNORMAL LOW (ref 3.87–5.11)
RDW: 13.7 % (ref 11.5–15.5)
WBC Count: 5.1 10*3/uL (ref 4.0–10.5)
nRBC: 0 % (ref 0.0–0.2)

## 2022-05-29 LAB — LACTATE DEHYDROGENASE: LDH: 204 U/L — ABNORMAL HIGH (ref 98–192)

## 2022-05-29 LAB — CMP (CANCER CENTER ONLY)
ALT: 9 U/L (ref 0–44)
AST: 14 U/L — ABNORMAL LOW (ref 15–41)
Albumin: 3.9 g/dL (ref 3.5–5.0)
Alkaline Phosphatase: 46 U/L (ref 38–126)
Anion gap: 10 (ref 5–15)
BUN: 11 mg/dL (ref 8–23)
CO2: 26 mmol/L (ref 22–32)
Calcium: 9.4 mg/dL (ref 8.9–10.3)
Chloride: 100 mmol/L (ref 98–111)
Creatinine: 0.71 mg/dL (ref 0.44–1.00)
GFR, Estimated: >60 mL/min (ref >60)
Glucose, Bld: 262 mg/dL — ABNORMAL HIGH (ref 70–99)
Potassium: 3.9 mmol/L (ref 3.5–5.1)
Sodium: 136 mmol/L (ref 135–145)
Total Bilirubin: 0.4 mg/dL (ref 0.3–1.2)
Total Protein: 6.7 g/dL (ref 6.5–8.1)

## 2022-05-29 MED ORDER — SODIUM CHLORIDE 0.9 % IV SOLN
10.0000 mg | Freq: Once | INTRAVENOUS | Status: AC
  Administered 2022-05-29: 10 mg via INTRAVENOUS
  Filled 2022-05-29: qty 10

## 2022-05-29 MED ORDER — HEPARIN SOD (PORK) LOCK FLUSH 100 UNIT/ML IV SOLN
500.0000 [IU] | Freq: Once | INTRAVENOUS | Status: AC | PRN
  Administered 2022-05-29: 500 [IU]

## 2022-05-29 MED ORDER — SODIUM CHLORIDE 0.9 % IV SOLN: Freq: Once | INTRAVENOUS | Status: AC

## 2022-05-29 MED ORDER — SODIUM CHLORIDE 0.9 % IV SOLN
4.0000 mg | Freq: Once | INTRAVENOUS | Status: AC
  Administered 2022-05-29: 4 mg via INTRAVENOUS
  Filled 2022-05-29: qty 8

## 2022-05-29 MED ORDER — PALONOSETRON HCL INJECTION 0.25 MG/5ML
0.2500 mg | Freq: Once | INTRAVENOUS | Status: AC
  Administered 2022-05-29: 0.25 mg via INTRAVENOUS
  Filled 2022-05-29: qty 5

## 2022-05-29 MED ORDER — SODIUM CHLORIDE 0.9% FLUSH
10.0000 mL | INTRAVENOUS | Status: DC | PRN
  Administered 2022-05-29 (×2): 10 mL

## 2022-05-29 NOTE — Progress Notes (Signed)
Hematology and Oncology Follow Up Visit  [name] [id number] Oct 27, 1950 72 y.o. 05/29/2022   Principle Diagnosis:  Small cell lung cancer -- Extensive stage Pulmonary embolism -right upper lobe segmental/subsegmental   Current Therapy:        Carbo/VP-16/Tecentriq -- s/p cycle 6 -- start on 06/11/2021 Xgeva 120 mg subcu every 3 months -- next dose on 07/2022 Radiation therapy to the chest-completed 01/06/2022 Eliquis 5 mg p.o. twice daily Lurbinectiden -- s/p cycle #2 on -start 04/15/2022 S/p SBRT for CNS mets -- completed on 04/29/2022   Interim History:  [name] is here today for follow-up.  Her main complaint is been lower back pain.  She has had spinal metastasis.  She has had radiation therapy in the past I think.  We will have to get a MRI and see how things look.  Overall, she is doing quite well with the lurbinectedin in.  I did give her a lot of credit for being so tough.  She is really had very little with nausea or vomiting.  She has had no cough or shortness of breath.  She has had no issues with oxygen.  She is off oxygen now.  She has had no leg swelling.  There is been no bleeding.  She has had no change in bowel or bladder habits.  She now is on Eliquis for anticoagulation.  This is a lot easier for her than the Lovenox.  Overall, I would say that her performance status is probably ECOG 1.    Medications:  Allergies as of 05/29/2022       Reactions   Lyrica [pregabalin] Other (See Comments)   Hallucinations    Crestor [rosuvastatin Calcium] Other (See Comments)   Leg cramps        Medication List        Accurate as of May 29, 2022 10:07 AM. If you have any questions, ask your nurse or doctor.          STOP taking these medications    UNABLE TO FIND Stopped by: [name], MD       TAKE these medications    acetaminophen 325 MG tablet Commonly known as: TYLENOL Take 1 tablet (325 mg total) by mouth every 6 (six) hours as  needed for moderate pain.   albuterol 108 (90 Base) MCG/ACT inhaler Commonly known as: VENTOLIN HFA Inhale 2 puffs into the lungs every 6 (six) hours as needed for wheezing or shortness of breath.   ALPRAZolam 0.5 MG tablet Commonly known as: XANAX Take 1 tablet (0.5 mg total) by mouth 3 (three) times daily as needed.   blood glucose meter kit and supplies Kit Use daily   dexamethasone 4 MG tablet Commonly known as: DECADRON Take 2 tablets (8 mg total) by mouth daily. Start the day after chemotherapy for 2 days. Take with food.   Eliquis 5 MG Tabs tablet Generic drug: apixaban Take 1 tablet (5 mg total) by mouth 2 (two) times daily.   FLUoxetine 20 MG capsule Commonly known as: PROZAC Take 1 capsule (20 mg total) by mouth daily.   Insulin Pen Needle 32G X 5 MM Misc Commonly known as: NovoTwist Use daily at Bedtime with Lantus   ipratropium-albuterol 0.5-2.5 (3) MG/3ML Soln Commonly known as: DUONEB Take 3 mLs by nebulization every 6 (six) hours as needed.   Lantus SoloStar 100 UNIT/ML Solostar Pen Generic drug: insulin glargine Inject 30 Units into the skin daily. HS   levothyroxine 125 MCG  tablet Commonly known as: Synthroid Take 1 tablet (125 mcg total) by mouth daily before breakfast.   lidocaine-prilocaine cream Commonly known as: EMLA Apply 1 Application topically as needed.   meclizine 12.5 MG tablet Commonly known as: ANTIVERT Take 1 tablet (12.5 mg total) by mouth every 6 (six) hours as needed for dizziness.   meloxicam 15 MG tablet Commonly known as: MOBIC TAKE 1 TABLET BY MOUTH DAILY   metFORMIN 500 MG 24 hr tablet Commonly known as: GLUCOPHAGE-XR Take 1 tablet (500 mg total) by mouth in the morning and at bedtime.   omeprazole 20 MG capsule Commonly known as: PRILOSEC Take 1 capsule (20 mg total) by mouth daily. What changed:  when to take this reasons to take this   ONE TOUCH ULTRA 2 w/Device Kit Check blood sugar as directed. Dx:E11.65    OneTouch Verio Flex System w/Device Kit USE AS DIRECTED TO CHECK BLOOD SUGAR FOUR TIMES A DAY   OneTouch Verio test strip Generic drug: glucose blood USE ONE STRIP TO TEST DAILY   oxyCODONE-acetaminophen 7.5-325 MG tablet Commonly known as: Percocet Take 1 tablet by mouth every 6 (six) hours as needed for severe pain.   Trulicity [id number] Sopn Generic drug: Dulaglutide Inject 0.75 mg into the skin every Saturday.   valACYclovir 1000 MG tablet Commonly known as: VALTREX TAKE ONE TABLET BY MOUTH DAILY AS NEEDED   Xtampza ER 13.5 MG C12a Generic drug: oxyCODONE ER Take 13.5 mg by mouth 2 (two) times daily. What changed:  when to take this reasons to take this        Allergies:  Allergies  Allergen Reactions   Lyrica [Pregabalin] Other (See Comments)    Hallucinations    Crestor [Rosuvastatin Calcium] Other (See Comments)    Leg cramps    Past Medical History, Surgical history, Social history, and Family History were reviewed and updated.  Review of Systems: Review of Systems  Constitutional:  Positive for malaise/fatigue.  HENT: Negative.    Eyes: Negative.   Respiratory:  Positive for cough and shortness of breath.   Cardiovascular:  Positive for palpitations.  Gastrointestinal: Negative.   Genitourinary: Negative.   Musculoskeletal: Negative.   Skin: Negative.   Neurological: Negative.   Endo/Heme/Allergies: Negative.   Psychiatric/Behavioral: Negative.       Physical Exam: Temperature is 98.6.  Pulse 86.  Blood pressure 102/68.  Weight is 113 pounds.  Wt Readings from Last 3 Encounters:  05/29/22 113 lb 12.8 oz (51.6 kg)  05/07/22 111 lb 12.8 oz (50.7 kg)  05/05/22 112 lb 9.6 oz (51.1 kg)   Physical Exam Vitals reviewed.  HENT:     Head: Normocephalic and atraumatic.  Eyes:     Pupils: Pupils are equal, round, and reactive to light.  Cardiovascular:     Rate and Rhythm: Normal rate and regular rhythm.     Heart sounds: Normal heart  sounds.  Pulmonary:     Effort: Pulmonary effort is normal.     Breath sounds: Normal breath sounds.  Abdominal:     General: Bowel sounds are normal.     Palpations: Abdomen is soft.  Musculoskeletal:        General: No tenderness or deformity. Normal range of motion.     Cervical back: Normal range of motion.  Lymphadenopathy:     Cervical: No cervical adenopathy.  Skin:    General: Skin is warm and dry.     Findings: No erythema or rash.  Neurological:  Mental Status: She is alert and oriented to person, place, and time.  Psychiatric:        Behavior: Behavior normal.        Thought Content: Thought content normal.        Judgment: Judgment normal.     Lab Results  Component Value Date   WBC 5.1 05/29/2022   HGB 11.6 (L) 05/29/2022   HCT 35.7 (L) 05/29/2022   MCV 95.7 05/29/2022   PLT 198 05/29/2022   Lab Results  Component Value Date   FERRITIN 1,198 (H) 03/05/2022   IRON 90 03/05/2022   TIBC 239 (L) 03/05/2022   UIBC 149 03/05/2022   IRONPCTSAT 38 (H) 03/05/2022   Lab Results  Component Value Date   RETICCTPCT 3.6 (H) 03/05/2022   RBC 3.73 (L) 05/29/2022   No results found for: "KPAFRELGTCHN", "LAMBDASER", "KAPLAMBRATIO" No results found for: "IGGSERUM", "IGA", "IGMSERUM" No results found for: "TOTALPROTELP", "ALBUMINELP", "A1GS", "A2GS", "BETS", "BETA2SER", "GAMS", "MSPIKE", "SPEI"   Chemistry      Component Value Date/Time   NA 136 05/29/2022 0858   K 3.9 05/29/2022 0858   CL 100 05/29/2022 0858   CO2 26 05/29/2022 0858   BUN 11 05/29/2022 0858   CREATININE 0.71 05/29/2022 0858   CREATININE 0.88 05/08/2021 1126      Component Value Date/Time   CALCIUM 9.4 05/29/2022 0858   ALKPHOS 46 05/29/2022 0858   AST 14 (L) 05/29/2022 0858   ALT 9 05/29/2022 0858   BILITOT 0.4 05/29/2022 0858       Impression and Plan: [name] is a very pleasant 72 yo caucasian female with extensive stage small cell lung cancer.  She has had chemotherapy and  immunotherapy.  She then had radiation therapy for residual disease.  Hopefully, she is responding to the lurbinectedin.  We will going get a another PET scan on her after this cycle.  I would like to get an MRI of the lower back to see how things are looking for her spine.  I will get this in about a week.  Will do the PET scan in about 3 weeks.  I will give her an extra week off for doing so well.  It would be nice for her to have an extra week off.    [name], MD 2/23/202410:07 AM

## 2022-05-29 NOTE — Progress Notes (Signed)
Patient is cleared for next treatment. She is experiencing back pain and will need an MRI. She is also due for her PET in March.   MRI scheduled for 06/03/2022. PET scheduled for 06/19/2022.  Patient is aware of PET and MRI appointments including date, time, and location. The following PET prep is reviewed with patient and confirmed with teachback: - arrive 30 minutes before appointment time - NPO except water for 6h before scan. No candy, no gum - hold long acting insulin for 8 hours and short acting insulin for 2 hours prior to scan.  - have a low carb dinner the night prior Radiology Information sheet reviewed and given to patient for reinforcement of education.   Oncology Nurse Navigator Documentation     05/29/2022   10:45 AM  Oncology Nurse Navigator Flowsheets  Navigator Follow Up Date: 06/19/2022  Navigator Follow Up Reason: Scan Review  Navigator Location [facility]  Navigator Encounter Type Treatment;Appt/Treatment Plan Review  Patient Visit Type MedOnc  Treatment Phase Active Tx  Barriers/Navigation Needs Coordination of Care;Education  Education Other  Interventions Coordination of Care;Education;Psycho-Social Support  Acuity Level 2-Minimal Needs (1-2 Barriers Identified)  Coordination of Care Radiology  Education Method Verbal;Written  Support Groups/Services Friends and Family  Time Spent with Patient 30

## 2022-05-29 NOTE — Patient Instructions (Signed)

## 2022-06-01 DIAGNOSIS — G4730 Sleep apnea, unspecified: Secondary | ICD-10-CM | POA: Diagnosis not present

## 2022-06-01 DIAGNOSIS — R0683 Snoring: Secondary | ICD-10-CM | POA: Diagnosis not present

## 2022-06-02 ENCOUNTER — Ambulatory Visit
Admission: RE | Admit: 2022-06-02 | Discharge: 2022-06-02 | Disposition: A | Discharge status: Home / Self Care | Payer: PPO | Source: Ambulatory Visit | Attending: Radiation Oncology | Admitting: Radiation Oncology

## 2022-06-02 VITALS — BP 93/69 | HR 98 | Temp 97.6°F | Resp 20 | Ht 59.0 in | Wt 112.2 lb

## 2022-06-02 DIAGNOSIS — Z791 Long term (current) use of non-steroidal anti-inflammatories (NSAID): Secondary | ICD-10-CM | POA: Diagnosis not present

## 2022-06-02 DIAGNOSIS — Z7984 Long term (current) use of oral hypoglycemic drugs: Secondary | ICD-10-CM | POA: Insufficient documentation

## 2022-06-02 DIAGNOSIS — Z7985 Long-term (current) use of injectable non-insulin antidiabetic drugs: Secondary | ICD-10-CM | POA: Diagnosis not present

## 2022-06-02 DIAGNOSIS — C3412 Malignant neoplasm of upper lobe, left bronchus or lung: Secondary | ICD-10-CM | POA: Diagnosis not present

## 2022-06-02 DIAGNOSIS — Z7952 Long term (current) use of systemic steroids: Secondary | ICD-10-CM | POA: Diagnosis not present

## 2022-06-02 DIAGNOSIS — Z7901 Long term (current) use of anticoagulants: Secondary | ICD-10-CM | POA: Diagnosis not present

## 2022-06-02 DIAGNOSIS — E1151 Type 2 diabetes mellitus with diabetic peripheral angiopathy without gangrene: Secondary | ICD-10-CM | POA: Diagnosis not present

## 2022-06-02 DIAGNOSIS — Z79890 Hormone replacement therapy (postmenopausal): Secondary | ICD-10-CM | POA: Insufficient documentation

## 2022-06-02 DIAGNOSIS — J449 Chronic obstructive pulmonary disease, unspecified: Secondary | ICD-10-CM

## 2022-06-02 DIAGNOSIS — C7931 Secondary malignant neoplasm of brain: Secondary | ICD-10-CM | POA: Insufficient documentation

## 2022-06-02 DIAGNOSIS — Z79899 Other long term (current) drug therapy: Secondary | ICD-10-CM | POA: Diagnosis not present

## 2022-06-02 DIAGNOSIS — Z794 Long term (current) use of insulin: Secondary | ICD-10-CM | POA: Insufficient documentation

## 2022-06-02 NOTE — Progress Notes (Signed)
Radiation Oncology         [phone] ________________________________  Name: [name] MRN: [id number]  Date: 06/02/2022  DOB: 1950/11/14  Follow-Up Visit Note  Outpatient  CC: Kelly Held, DO  Kelly Olp Rudell Cobb, MD  Diagnosis:   Mrs. [name] presents today for follow up after completing SRS to her brain on 04/29/2022    Metastasis to brain [facility]), C79.31 Small cell lung cancer, left upper lobe (HCC)  Metastasis to brain [facility])  Type 2 diabetes mellitus with diabetic peripheral angiopathy without gangrene, without long-term current use of insulin ([place]), Chronic  Chronic obstructive pulmonary disease, unspecified COPD type ([place]), Chronic   ICD-10-CM   1. Small cell lung cancer, left upper lobe (HCC)  C34.12     2. Metastasis to brain ([place])  C79.31     3. Type 2 diabetes mellitus with diabetic peripheral angiopathy without gangrene, without long-term current use of insulin (HCC) Chronic E11.51     4. Chronic obstructive pulmonary disease, unspecified COPD type ([place]) Chronic J44.9       Small cell lung cancer, left upper lobe; stage IVB (cT2b, cN3, pM1c)   CHIEF COMPLAINT: Here for follow-up and surveillance of metastatic disease to the brain  Interval Since Last Radiation:  1 month  Narrative:  The patient returns today for routine follow-up since completing radiation therapy.         Patient is here with her supportive husband today. She reports to be doing well today.   Recent neurologic symptoms, if any:  Seizures: Denies Headaches: Reports an isolated headache last week, but reports it resolved after taking OTC Tylenol, and has not returned. Patient has experienced headaches like these in the past.  Nausea: Denies, but does report her appetite is diminished     Wt Readings from Last 3 Encounters:  06/02/22 112 lb 3.2 oz (50.9 kg)  05/29/22 113 lb 12.8 oz (51.6 kg)  05/07/22 111 lb 12.8 oz (50.7 kg)    Dizziness/ataxia: Denies Difficulty with hand  coordination: Denies Focal numbness/weakness: Denies Visual deficits/changes: Denies any new concerns. Reports she has a floater in her left eye and is scheduled to see her optometrist this Thursday to address Confusion/Memory deficits: Denies    ALLERGIES:  is allergic to hydrocodone-acetaminophen, lyrica [pregabalin], and crestor [rosuvastatin calcium].  Meds: Current Outpatient Medications  Medication Sig Dispense Refill   acetaminophen (TYLENOL) 325 MG tablet Take 1 tablet (325 mg total) by mouth every 6 (six) hours as needed for moderate pain.     albuterol (VENTOLIN HFA) 108 (90 Base) MCG/ACT inhaler Inhale 2 puffs into the lungs every 6 (six) hours as needed for wheezing or shortness of breath. 8 g 2   ALPRAZolam (XANAX) 0.5 MG tablet Take 1 tablet (0.5 mg total) by mouth 3 (three) times daily as needed. 90 tablet 1   apixaban (ELIQUIS) 5 MG TABS tablet Take 1 tablet (5 mg total) by mouth 2 (two) times daily. 60 tablet 0   blood glucose meter kit and supplies KIT Use daily 1 each 0   Blood Glucose Monitoring Suppl (ONE TOUCH ULTRA 2) w/Device KIT Check blood sugar as directed. Dx:E11.65 1 kit 0   Blood Glucose Monitoring Suppl (ONETOUCH VERIO FLEX SYSTEM) w/Device KIT USE AS DIRECTED TO CHECK BLOOD SUGAR FOUR TIMES A DAY (Patient not taking: Reported on 05/29/2022) 1 kit 0   dexamethasone (DECADRON) 4 MG tablet Take 2 tablets (8 mg total) by mouth daily. Start the day after chemotherapy for  2 days. Take with food. 30 tablet 1   FLUoxetine (PROZAC) 20 MG capsule Take 1 capsule (20 mg total) by mouth daily. 90 capsule 3   glucose blood (ONETOUCH VERIO) test strip USE ONE STRIP TO TEST DAILY 100 strip 1   insulin glargine (LANTUS SOLOSTAR) 100 UNIT/ML Solostar Pen Inject 30 Units into the skin daily. HS     Insulin Pen Needle (NOVOTWIST) 32G X 5 MM MISC Use daily at Bedtime with Lantus 100 each 0   ipratropium-albuterol (DUONEB) 0.5-2.5 (3) MG/3ML SOLN Take 3 mLs by nebulization every 6  (six) hours as needed. 360 mL 3   levothyroxine (SYNTHROID) 125 MCG tablet Take 1 tablet (125 mcg total) by mouth daily before breakfast. 90 tablet 3   lidocaine-prilocaine (EMLA) cream Apply 1 Application topically as needed. 30 g 0   meclizine (ANTIVERT) 12.5 MG tablet Take 1 tablet (12.5 mg total) by mouth every 6 (six) hours as needed for dizziness. (Patient not taking: Reported on 05/29/2022) 30 tablet 0   meloxicam (MOBIC) 15 MG tablet TAKE 1 TABLET BY MOUTH DAILY 30 tablet 2   metFORMIN (GLUCOPHAGE-XR) 500 MG 24 hr tablet Take 1 tablet (500 mg total) by mouth in the morning and at bedtime. 180 tablet 0   methylPREDNISolone (MEDROL DOSEPAK) 4 MG TBPK tablet 1 pack 1 each 0   omeprazole (PRILOSEC) 20 MG capsule Take 1 capsule (20 mg total) by mouth daily. (Patient taking differently: Take 20 mg by mouth daily as needed (indigestion).) 90 capsule 3   oxyCODONE ER (XTAMPZA ER) 13.5 MG C12A Take 13.5 mg by mouth 2 (two) times daily. (Patient taking differently: Take 13.5 mg by mouth 2 (two) times daily as needed (pain).) 60 capsule 0   oxyCODONE-acetaminophen (PERCOCET) 7.5-325 MG tablet Take 1 tablet by mouth every 6 (six) hours as needed for severe pain. 90 tablet 0   TRULICITY [id number] 0000000 SOPN Inject 0.75 mg into the skin every Saturday.     valACYclovir (VALTREX) 1000 MG tablet TAKE ONE TABLET BY MOUTH DAILY AS NEEDED (Patient not taking: Reported on 05/29/2022) 30 tablet 0   Current Facility-Administered Medications  Medication Dose Route Frequency Provider Last Rate Last Admin   ipratropium-albuterol (DUONEB) 0.5-2.5 (3) MG/3ML nebulizer solution 3 mL  3 mL Nebulization Q6H Lowne Chase, Kendrick Fries R, DO       Facility-Administered Medications Ordered in Other Encounters  Medication Dose Route Frequency Provider Last Rate Last Admin   sodium chloride flush (NS) 0.9 % injection 10 mL  10 mL Intravenous PRN Celso Amy, NP   10 mL at 08/18/21 [id number]    Physical Findings: The patient is in  no acute distress. Patient is alert and oriented.  height is '4\' 11"'$  (1.499 m) and weight is 112 lb 3.2 oz (50.9 kg). Her temperature is 97.6 F (36.4 C). Her blood pressure is 93/69 and her pulse is 98. Her respiration is 20 and oxygen saturation is 97%.  General: Alert and oriented, in no acute distress. HEENT: Head is normocephalic. Extraocular movements are intact. Oropharynx is clear. Neck: Neck is supple, no palpable cervical or supraclavicular lymphadenopathy. Heart: Regular in rate and rhythm with no murmurs, rubs, or gallops. Chest: Clear to auscultation bilaterally, with no rhonchi, wheezes, or rales. Abdomen: Soft, nontender, nondistended, with no rigidity or guarding. Extremities: No cyanosis or edema. Lymphatics: see Neck Exam Skin: No concerning lesions. Musculoskeletal: symmetric strength and muscle tone throughout. Neurologic: Cranial nerves II through XII are grossly intact. No obvious focalities.  Speech is fluent. Coordination is intact. No double vision.  Psychiatric: Judgment and insight are intact. Affect is appropriate.   Lab Findings: Lab Results  Component Value Date   WBC 5.1 05/29/2022   HGB 11.6 (L) 05/29/2022   HCT 35.7 (L) 05/29/2022   MCV 95.7 05/29/2022   PLT 198 05/29/2022    Radiographic Findings: DG Chest 2 View  Result Date: 05/05/2022 CLINICAL DATA:  History of cough. History of small cell lung cancer. EXAM: CHEST - 2 VIEW COMPARISON:  PET-CT 04/02/2022.  Chest x-ray 03/11/2022. FINDINGS: PowerPort catheter in stable position. Heart size normal. Persistent left infrahilar infiltrate without significant change. Small left pleural effusion cannot be excluded. Left-sided pleuroparenchymal thickening consistent scarring. No pneumothorax. Deformity again noted of the right anterior fourth rib, possibly secondary to metastatic disease. Reference made to prior recent PET-CT report. Prior cervicothoracic fusion. Degenerative changes scoliosis thoracic spine.  Surgical clips right upper quadrant. IMPRESSION: 1. Persistent left infrahilar infiltrate without significant change. Small left pleural effusion cannot be excluded. 2. Deformity again noted of the right anterior fourth rib, possibly secondary to metastatic disease. Reference made to prior recent PET-CT report of 04/02/2022. Electronically Signed   By: Marcello Moores  Register M.D.   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   Patient is recovering well from radiation therapy. She denies any new neurologic deficits. We will schedule her for a follow up brain MRI in 2 months and she will meet with neurosurgery for follow up after the scan. She will continue follow up with Dr. Marin Olp for her lung cancer. She knows she can always call back with any questions or concerns in the meantime.  On date of service, in total, I spent 20 minutes on this encounter. Patient was seen in person.  _____________________________________   Leona Singleton, PA    Eppie Gibson, MD

## 2022-06-02 NOTE — Progress Notes (Signed)
Mrs. Cabazos presents today for follow up after completing [place] to her brain on 04/29/2022  Recent neurologic symptoms, if any:  Seizures: Denies Headaches: Reports an isolated headache last week, but reports it resolved after taking OTC Tylenol, and has not returned Nausea: Denies, but does report her appetite is diminished  Wt Readings from Last 3 Encounters:  06/02/22 112 lb 3.2 oz (50.9 kg)  05/29/22 113 lb 12.8 oz (51.6 kg)  05/07/22 111 lb 12.8 oz (50.7 kg)   Dizziness/ataxia: Denies Difficulty with hand coordination: Denies Focal numbness/weakness: Denies Visual deficits/changes: Denies any new concerns. Reports she has a floater in her left eye and is scheduled to see her optometrist this Thursday to address Confusion/Memory deficits: Denies  Other issues of note: Last saw her medical oncologist Dr. Marin Olp on 05/29/22. Reports she pulled a muslce on her left side/flank while cleaning/organizing

## 2022-06-03 ENCOUNTER — Other Ambulatory Visit: Payer: Self-pay

## 2022-06-03 ENCOUNTER — Encounter: Payer: Self-pay | Admitting: *Deleted

## 2022-06-03 ENCOUNTER — Encounter: Payer: Self-pay | Admitting: Radiation Oncology

## 2022-06-03 ENCOUNTER — Ambulatory Visit (HOSPITAL_COMMUNITY)
Admission: RE | Admit: 2022-06-03 | Discharge: 2022-06-03 | Disposition: A | Discharge status: Home / Self Care | Payer: PPO | Source: Ambulatory Visit | Attending: Hematology & Oncology | Admitting: Hematology & Oncology

## 2022-06-03 DIAGNOSIS — M48061 Spinal stenosis, lumbar region without neurogenic claudication: Secondary | ICD-10-CM | POA: Diagnosis not present

## 2022-06-03 DIAGNOSIS — C3412 Malignant neoplasm of upper lobe, left bronchus or lung: Secondary | ICD-10-CM | POA: Diagnosis not present

## 2022-06-03 DIAGNOSIS — M5126 Other intervertebral disc displacement, lumbar region: Secondary | ICD-10-CM | POA: Diagnosis not present

## 2022-06-03 MED ORDER — METHYLPREDNISOLONE 4 MG PO TBPK: ORAL_TABLET | ORAL | 0 refills | Status: DC

## 2022-06-03 MED ORDER — GADOBUTROL 1 MMOL/ML IV SOLN
5.0000 mL | Freq: Once | INTRAVENOUS | Status: AC | PRN
  Administered 2022-06-03: 5 mL via INTRAVENOUS

## 2022-06-03 NOTE — Telephone Encounter (Signed)
Medrol dose pak ordered per MD for patient rash, pt informed.

## 2022-06-04 ENCOUNTER — Encounter: Payer: Self-pay | Admitting: *Deleted

## 2022-06-04 DIAGNOSIS — H43392 Other vitreous opacities, left eye: Secondary | ICD-10-CM | POA: Diagnosis not present

## 2022-06-04 NOTE — Progress Notes (Signed)
MRI shows new metastatic disease to the spine.   Oncology Nurse Navigator Documentation     06/04/2022    1:45 PM  Oncology Nurse Navigator Flowsheets  Navigator Follow Up Date: 06/19/2022  Navigator Follow Up Reason: Scan Review  Navigator Location [facility]  Navigator Encounter Type Scan Review  Patient Visit Type MedOnc  Treatment Phase Active Tx  Barriers/Navigation Needs Coordination of Care;Education  Interventions None Required  Acuity Level 2-Minimal Needs (1-2 Barriers Identified)  Support Groups/Services Friends and Family  Time Spent with Patient 15

## 2022-06-05 ENCOUNTER — Ambulatory Visit (INDEPENDENT_AMBULATORY_CARE_PROVIDER_SITE_OTHER): Payer: PPO | Admitting: Family Medicine

## 2022-06-05 ENCOUNTER — Encounter: Payer: Self-pay | Admitting: Family Medicine

## 2022-06-05 VITALS — BP 106/65 | HR 115 | Temp 98.0°F | Ht 59.0 in | Wt 112.0 lb

## 2022-06-05 DIAGNOSIS — M545 Low back pain, unspecified: Secondary | ICD-10-CM

## 2022-06-05 DIAGNOSIS — K5903 Drug induced constipation: Secondary | ICD-10-CM

## 2022-06-05 MED ORDER — RELISTOR 12 MG/0.6ML ~~LOC~~ SOLN: 8.0000 mg | Freq: Once | SUBCUTANEOUS | 0 refills | Status: AC

## 2022-06-05 MED ORDER — TIZANIDINE HCL 4 MG PO TABS: 4.0000 mg | ORAL_TABLET | Freq: Four times a day (QID) | ORAL | 0 refills | Status: DC | PRN

## 2022-06-05 NOTE — Patient Instructions (Signed)

## 2022-06-05 NOTE — Progress Notes (Signed)
Musculoskeletal Exam  Patient: [name] DOB: 10-23-1950  DOS: 06/05/2022  SUBJECTIVE:  Chief Complaint:   Chief Complaint  Patient presents with   Flank Pain    Very sore today for 6 days    [name] is a 72 y.o.  female for evaluation and treatment of L side pain.  She is here with her spouse.  Onset:  6 days ago. Was moving boxes around 2 days prior to onset of injury.  Location: L side/back Character:  sharp  Progression of issue:  is unchanged Associated symptoms: no BM since it started, still passing gas.  Treatment: to date has been MiraLAX and various OTC stool softeners.   She is on oxycodone for chronic cancer-related pain.  She takes it every 6 hours.  She had chemotherapy recently and will sometimes get constipated after going through a round. Neurovascular symptoms: no  Past Medical History:  Diagnosis Date   Arthritis    Cancer ([place])    Diabetes mellitus    Esophagitis    Goals of care, counseling/discussion 05/23/2021   Hiatal hernia    History of radiation therapy    Left Lung- 11/27/21-01/08/22- Dr. 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 No acute distress. Abdomen: Bowel sounds present, tender in the left lower quadrant with some fullness.  No distention.  Negative Rovsing's, McBurney's, Murphy's,  Carnett's Heart: RRR Thorax & Lungs: Clear to auscultation bilaterally.  No accessory muscle use Musculoskeletal: L lower back.   Tenderness to palpation: Yes over posterior left oblique/erector spinae muscle group Deformity: no Ecchymosis: no Tests positive: None Tests negative: Straight leg Neurologic: Normal sensory function. No focal deficits noted. DTR's equal and symmetric in LE's. No clonus. Psychiatric: Normal mood. Age appropriate judgment and insight. Alert & oriented x 3.    Assessment:  Drug induced constipation - Plan: methylnaltrexone (RELISTOR) 12 MG/0.6ML SOLN injection  Acute left-sided low back pain without sciatica - Plan: tiZANidine (ZANAFLEX) 4 MG tablet  Plan: Adverse effect of medication.  Trial a single dose of Relistor.  She will send me a message if this is not covered by insurance.   Stretches/exercises, heat, ice, Tylenol, Zanaflex as needed.  She was instructed to take the Zanaflex 1 to 2 hours before planned bedtime to see if it does make her drowsy as it can do this in a minority of patients. F/u prn. The patient and her spouse voiced understanding and agreement to the plan.   [place], DO 06/05/22  11:34 AM

## 2022-06-08 ENCOUNTER — Telehealth: Payer: Self-pay | Admitting: Emergency Medicine

## 2022-06-08 ENCOUNTER — Other Ambulatory Visit: Payer: Self-pay | Admitting: *Deleted

## 2022-06-08 ENCOUNTER — Telehealth: Payer: Self-pay

## 2022-06-08 MED ORDER — AMOXICILLIN 500 MG PO TABS: 2000.0000 mg | ORAL_TABLET | Freq: Once | ORAL | 0 refills | Status: AC

## 2022-06-08 NOTE — Telephone Encounter (Signed)
I found the ONO results in Katie's cubby up front. I have placed them in the front of RB's purple folder for review.   Called and spoke with patient and she is aware of the above information.   Will route to RB as FYI.

## 2022-06-08 NOTE — Telephone Encounter (Signed)
Pt. Calling to ask about results of overnight sleep o2 test by [employer] did and wants to  know if she can get off oxygen

## 2022-06-08 NOTE — Telephone Encounter (Signed)
Please advise on ONO results if you have received those. We will request those if you don't have them yet

## 2022-06-08 NOTE — Telephone Encounter (Signed)
I don't see the report in EPIC. If it is not in my review folder at the office then we will need to request the results.

## 2022-06-08 NOTE — Telephone Encounter (Signed)
PA initiated via Covermymeds; KEY: [id number]. Awaiting determination.

## 2022-06-08 NOTE — Telephone Encounter (Signed)
Thank you :)

## 2022-06-10 ENCOUNTER — Other Ambulatory Visit: Payer: Self-pay

## 2022-06-10 DIAGNOSIS — G4733 Obstructive sleep apnea (adult) (pediatric): Secondary | ICD-10-CM

## 2022-06-10 NOTE — Telephone Encounter (Signed)
Called and spoke w/ pt and husband, they verbalized understanding. Order placed for O2 to be bled through CPAP and repeat ONO ordered as well

## 2022-06-12 ENCOUNTER — Other Ambulatory Visit: Payer: Self-pay | Admitting: *Deleted

## 2022-06-12 DIAGNOSIS — C787 Secondary malignant neoplasm of liver and intrahepatic bile duct: Secondary | ICD-10-CM

## 2022-06-12 MED ORDER — OXYCODONE-ACETAMINOPHEN 7.5-325 MG PO TABS: 1.0000 | ORAL_TABLET | Freq: Four times a day (QID) | ORAL | 0 refills | Status: DC | PRN

## 2022-06-15 ENCOUNTER — Telehealth: Payer: Self-pay | Admitting: *Deleted

## 2022-06-15 NOTE — Progress Notes (Signed)
  Care Coordination   Note   06/15/2022 Name: [name] MRN: [id number] DOB: Aug 05, 1950  [name] is a 72 y.o. year old female who sees Carollee Herter, Alferd Apa, DO for primary care. I reached out to [place] by phone today to offer care coordination services.  [name] was given information about Care Coordination services today including:   The Care Coordination services include support from the care team which includes your Nurse Coordinator, Clinical Social Worker, or Pharmacist.  The Care Coordination team is here to help remove barriers to the health concerns and goals most important to you. Care Coordination services are voluntary, and the patient may decline or stop services at any time by request to their care team member.   Care Coordination Consent Status: Patient did not agree to participate in care coordination services at this time.  Follow up plan:  none indicated   Encounter Outcome:  Pt. Refused  Julian Hy, [place] Direct Dial: [phone]

## 2022-06-16 DIAGNOSIS — J9601 Acute respiratory failure with hypoxia: Secondary | ICD-10-CM | POA: Diagnosis not present

## 2022-06-18 ENCOUNTER — Inpatient Hospital Stay: Payer: PPO | Admitting: Hematology & Oncology

## 2022-06-18 ENCOUNTER — Encounter: Payer: Self-pay | Admitting: Nurse Practitioner

## 2022-06-18 ENCOUNTER — Inpatient Hospital Stay: Payer: PPO

## 2022-06-19 ENCOUNTER — Ambulatory Visit (HOSPITAL_COMMUNITY)
Admission: RE | Admit: 2022-06-19 | Discharge: 2022-06-19 | Disposition: A | Discharge status: Home / Self Care | Payer: PPO | Source: Ambulatory Visit | Attending: Hematology & Oncology | Admitting: Hematology & Oncology

## 2022-06-19 DIAGNOSIS — C3412 Malignant neoplasm of upper lobe, left bronchus or lung: Secondary | ICD-10-CM | POA: Diagnosis not present

## 2022-06-19 DIAGNOSIS — C3432 Malignant neoplasm of lower lobe, left bronchus or lung: Secondary | ICD-10-CM | POA: Diagnosis not present

## 2022-06-19 LAB — GLUCOSE, CAPILLARY: Glucose-Capillary: 191 mg/dL — ABNORMAL HIGH (ref 70–99)

## 2022-06-19 MED ORDER — FLUDEOXYGLUCOSE F - 18 (FDG) INJECTION
5.5700 | Freq: Once | INTRAVENOUS | Status: AC | PRN
  Administered 2022-06-19: 5.57 via INTRAVENOUS

## 2022-06-23 ENCOUNTER — Encounter: Payer: Self-pay | Admitting: *Deleted

## 2022-06-23 DIAGNOSIS — R0683 Snoring: Secondary | ICD-10-CM | POA: Diagnosis not present

## 2022-06-23 DIAGNOSIS — G4730 Sleep apnea, unspecified: Secondary | ICD-10-CM | POA: Diagnosis not present

## 2022-06-23 NOTE — Progress Notes (Signed)
Reviewed recent PET which shows progression.   Oncology Nurse Navigator Documentation     06/23/2022    7:45 AM  Oncology Nurse Navigator Flowsheets  Navigator Follow Up Date: 06/26/2022  Navigator Follow Up Reason: Follow-up Appointment  Navigator Location [facility]  Navigator Encounter Type Scan Review  Patient Visit Type MedOnc  Treatment Phase Active Tx  Barriers/Navigation Needs Coordination of Care;Education  Interventions None Required  Acuity Level 2-Minimal Needs (1-2 Barriers Identified)  Support Groups/Services Friends and Family  Time Spent with Patient 15

## 2022-06-24 ENCOUNTER — Telehealth: Payer: Self-pay

## 2022-06-24 NOTE — Telephone Encounter (Signed)
Received phone call from patient stating she had a "virus or bug that my husband gave me" Pt states she has been having low grade fevers of 100.0 and congestion along with body aches and fatigue. This RN discussed symptoms with Dr. Marin Olp who stated patient needs to take a Covid test and continue to manage her symptoms at home. Pt educated to stay hydrated. Pt aware that if she is positive for covid to let us know but she will need to be followed by her PCP for that and will need to reschedule appointments. Pt aware if her fever increases appointments will need to be rescheduled. Pt encouraged to keep in touch with her symptoms. Pt verbalized understanding and had no further questions.

## 2022-06-26 ENCOUNTER — Other Ambulatory Visit: Payer: Self-pay

## 2022-06-26 ENCOUNTER — Inpatient Hospital Stay: Payer: PPO

## 2022-06-26 ENCOUNTER — Emergency Department (HOSPITAL_BASED_OUTPATIENT_CLINIC_OR_DEPARTMENT_OTHER): Payer: PPO

## 2022-06-26 ENCOUNTER — Encounter (HOSPITAL_BASED_OUTPATIENT_CLINIC_OR_DEPARTMENT_OTHER): Payer: Self-pay | Admitting: Emergency Medicine

## 2022-06-26 ENCOUNTER — Inpatient Hospital Stay: Payer: PPO | Admitting: Hematology & Oncology

## 2022-06-26 ENCOUNTER — Inpatient Hospital Stay (HOSPITAL_BASED_OUTPATIENT_CLINIC_OR_DEPARTMENT_OTHER)
Admission: EM | Admit: 2022-06-26 | Discharge: 2022-06-29 | DRG: 193 | Disposition: A | Discharge status: Home / Self Care | Payer: PPO | Attending: Internal Medicine | Admitting: Internal Medicine

## 2022-06-26 ENCOUNTER — Inpatient Hospital Stay (HOSPITAL_BASED_OUTPATIENT_CLINIC_OR_DEPARTMENT_OTHER): Payer: PPO

## 2022-06-26 DIAGNOSIS — J700 Acute pulmonary manifestations due to radiation: Secondary | ICD-10-CM | POA: Diagnosis present

## 2022-06-26 DIAGNOSIS — I9589 Other hypotension: Secondary | ICD-10-CM

## 2022-06-26 DIAGNOSIS — R197 Diarrhea, unspecified: Secondary | ICD-10-CM | POA: Diagnosis present

## 2022-06-26 DIAGNOSIS — R404 Transient alteration of awareness: Secondary | ICD-10-CM | POA: Diagnosis present

## 2022-06-26 DIAGNOSIS — Y842 Radiological procedure and radiotherapy as the cause of abnormal reaction of the patient, or of later complication, without mention of misadventure at the time of the procedure: Secondary | ICD-10-CM | POA: Diagnosis present

## 2022-06-26 DIAGNOSIS — E871 Hypo-osmolality and hyponatremia: Secondary | ICD-10-CM | POA: Diagnosis present

## 2022-06-26 DIAGNOSIS — Z79899 Other long term (current) drug therapy: Secondary | ICD-10-CM

## 2022-06-26 DIAGNOSIS — Z794 Long term (current) use of insulin: Secondary | ICD-10-CM

## 2022-06-26 DIAGNOSIS — Z9221 Personal history of antineoplastic chemotherapy: Secondary | ICD-10-CM

## 2022-06-26 DIAGNOSIS — C7800 Secondary malignant neoplasm of unspecified lung: Secondary | ICD-10-CM | POA: Diagnosis not present

## 2022-06-26 DIAGNOSIS — Z86010 Personal history of colonic polyps: Secondary | ICD-10-CM

## 2022-06-26 DIAGNOSIS — Z7901 Long term (current) use of anticoagulants: Secondary | ICD-10-CM | POA: Diagnosis not present

## 2022-06-26 DIAGNOSIS — Z803 Family history of malignant neoplasm of breast: Secondary | ICD-10-CM

## 2022-06-26 DIAGNOSIS — C3490 Malignant neoplasm of unspecified part of unspecified bronchus or lung: Secondary | ICD-10-CM | POA: Diagnosis not present

## 2022-06-26 DIAGNOSIS — Z1152 Encounter for screening for COVID-19: Secondary | ICD-10-CM

## 2022-06-26 DIAGNOSIS — C787 Secondary malignant neoplasm of liver and intrahepatic bile duct: Secondary | ICD-10-CM

## 2022-06-26 DIAGNOSIS — I959 Hypotension, unspecified: Secondary | ICD-10-CM | POA: Diagnosis present

## 2022-06-26 DIAGNOSIS — R400 Somnolence: Secondary | ICD-10-CM

## 2022-06-26 DIAGNOSIS — Z85118 Personal history of other malignant neoplasm of bronchus and lung: Secondary | ICD-10-CM

## 2022-06-26 DIAGNOSIS — C7951 Secondary malignant neoplasm of bone: Secondary | ICD-10-CM | POA: Diagnosis not present

## 2022-06-26 DIAGNOSIS — E119 Type 2 diabetes mellitus without complications: Secondary | ICD-10-CM

## 2022-06-26 DIAGNOSIS — J129 Viral pneumonia, unspecified: Secondary | ICD-10-CM | POA: Diagnosis not present

## 2022-06-26 DIAGNOSIS — E861 Hypovolemia: Secondary | ICD-10-CM

## 2022-06-26 DIAGNOSIS — Z886 Allergy status to analgesic agent status: Secondary | ICD-10-CM

## 2022-06-26 DIAGNOSIS — R059 Cough, unspecified: Secondary | ICD-10-CM | POA: Diagnosis not present

## 2022-06-26 DIAGNOSIS — G4733 Obstructive sleep apnea (adult) (pediatric): Secondary | ICD-10-CM | POA: Diagnosis present

## 2022-06-26 DIAGNOSIS — R1110 Vomiting, unspecified: Secondary | ICD-10-CM | POA: Diagnosis not present

## 2022-06-26 DIAGNOSIS — Z86711 Personal history of pulmonary embolism: Secondary | ICD-10-CM

## 2022-06-26 DIAGNOSIS — C7940 Secondary malignant neoplasm of unspecified part of nervous system: Secondary | ICD-10-CM | POA: Diagnosis not present

## 2022-06-26 DIAGNOSIS — B9781 Human metapneumovirus as the cause of diseases classified elsewhere: Secondary | ICD-10-CM | POA: Diagnosis present

## 2022-06-26 DIAGNOSIS — E1165 Type 2 diabetes mellitus with hyperglycemia: Secondary | ICD-10-CM | POA: Diagnosis not present

## 2022-06-26 DIAGNOSIS — Z79890 Hormone replacement therapy (postmenopausal): Secondary | ICD-10-CM

## 2022-06-26 DIAGNOSIS — Z806 Family history of leukemia: Secondary | ICD-10-CM

## 2022-06-26 DIAGNOSIS — C3412 Malignant neoplasm of upper lobe, left bronchus or lung: Secondary | ICD-10-CM | POA: Diagnosis not present

## 2022-06-26 DIAGNOSIS — R410 Disorientation, unspecified: Secondary | ICD-10-CM | POA: Diagnosis not present

## 2022-06-26 DIAGNOSIS — Z823 Family history of stroke: Secondary | ICD-10-CM

## 2022-06-26 DIAGNOSIS — R000 Tachycardia, unspecified: Secondary | ICD-10-CM | POA: Diagnosis not present

## 2022-06-26 DIAGNOSIS — J9621 Acute and chronic respiratory failure with hypoxia: Secondary | ICD-10-CM | POA: Diagnosis not present

## 2022-06-26 DIAGNOSIS — R0602 Shortness of breath: Secondary | ICD-10-CM | POA: Diagnosis not present

## 2022-06-26 DIAGNOSIS — E039 Hypothyroidism, unspecified: Secondary | ICD-10-CM | POA: Diagnosis present

## 2022-06-26 DIAGNOSIS — I2699 Other pulmonary embolism without acute cor pulmonale: Secondary | ICD-10-CM | POA: Diagnosis not present

## 2022-06-26 DIAGNOSIS — Z87891 Personal history of nicotine dependence: Secondary | ICD-10-CM

## 2022-06-26 DIAGNOSIS — I10 Essential (primary) hypertension: Secondary | ICD-10-CM | POA: Diagnosis present

## 2022-06-26 DIAGNOSIS — E278 Other specified disorders of adrenal gland: Secondary | ICD-10-CM | POA: Diagnosis not present

## 2022-06-26 DIAGNOSIS — B59 Pneumocystosis: Secondary | ICD-10-CM

## 2022-06-26 DIAGNOSIS — J849 Interstitial pulmonary disease, unspecified: Secondary | ICD-10-CM | POA: Diagnosis present

## 2022-06-26 DIAGNOSIS — J9601 Acute respiratory failure with hypoxia: Secondary | ICD-10-CM | POA: Diagnosis not present

## 2022-06-26 DIAGNOSIS — E1151 Type 2 diabetes mellitus with diabetic peripheral angiopathy without gangrene: Secondary | ICD-10-CM

## 2022-06-26 DIAGNOSIS — Z7984 Long term (current) use of oral hypoglycemic drugs: Secondary | ICD-10-CM | POA: Diagnosis not present

## 2022-06-26 DIAGNOSIS — Z66 Do not resuscitate: Secondary | ICD-10-CM | POA: Diagnosis not present

## 2022-06-26 DIAGNOSIS — Z923 Personal history of irradiation: Secondary | ICD-10-CM

## 2022-06-26 DIAGNOSIS — J189 Pneumonia, unspecified organism: Secondary | ICD-10-CM | POA: Diagnosis not present

## 2022-06-26 DIAGNOSIS — E876 Hypokalemia: Secondary | ICD-10-CM | POA: Diagnosis not present

## 2022-06-26 DIAGNOSIS — C778 Secondary and unspecified malignant neoplasm of lymph nodes of multiple regions: Secondary | ICD-10-CM | POA: Diagnosis not present

## 2022-06-26 DIAGNOSIS — R590 Localized enlarged lymph nodes: Secondary | ICD-10-CM | POA: Diagnosis present

## 2022-06-26 DIAGNOSIS — Z888 Allergy status to other drugs, medicaments and biological substances status: Secondary | ICD-10-CM

## 2022-06-26 DIAGNOSIS — R0902 Hypoxemia: Secondary | ICD-10-CM

## 2022-06-26 DIAGNOSIS — R112 Nausea with vomiting, unspecified: Secondary | ICD-10-CM | POA: Diagnosis present

## 2022-06-26 DIAGNOSIS — Z811 Family history of alcohol abuse and dependence: Secondary | ICD-10-CM

## 2022-06-26 DIAGNOSIS — Z8249 Family history of ischemic heart disease and other diseases of the circulatory system: Secondary | ICD-10-CM

## 2022-06-26 LAB — PROTIME-INR
INR: 1.1 (ref 0.8–1.2)
Prothrombin Time: 13.9 seconds (ref 11.4–15.2)

## 2022-06-26 LAB — COMPREHENSIVE METABOLIC PANEL
ALT: 13 U/L (ref 0–44)
AST: 24 U/L (ref 15–41)
Albumin: 3.8 g/dL (ref 3.5–5.0)
Alkaline Phosphatase: 66 U/L (ref 38–126)
Anion gap: 10 (ref 5–15)
BUN: 15 mg/dL (ref 8–23)
CO2: 24 mmol/L (ref 22–32)
Calcium: 8.6 mg/dL — ABNORMAL LOW (ref 8.9–10.3)
Chloride: 95 mmol/L — ABNORMAL LOW (ref 98–111)
Creatinine, Ser: 0.69 mg/dL (ref 0.44–1.00)
GFR, Estimated: >60 mL/min (ref >60)
Glucose, Bld: 265 mg/dL — ABNORMAL HIGH (ref 70–99)
Potassium: 3.5 mmol/L (ref 3.5–5.1)
Sodium: 129 mmol/L — ABNORMAL LOW (ref 135–145)
Total Bilirubin: 0.6 mg/dL (ref 0.3–1.2)
Total Protein: 7.9 g/dL (ref 6.5–8.1)

## 2022-06-26 LAB — CBC WITH DIFFERENTIAL/PLATELET
Abs Immature Granulocytes: 0.02 10*3/uL (ref 0.00–0.07)
Basophils Absolute: 0 10*3/uL (ref 0.0–0.1)
Basophils Relative: 0 %
Eosinophils Absolute: 0 10*3/uL (ref 0.0–0.5)
Eosinophils Relative: 0 %
HCT: 37 % (ref 36.0–46.0)
Hemoglobin: 12.2 g/dL (ref 12.0–15.0)
Immature Granulocytes: 0 %
Lymphocytes Relative: 4 %
Lymphs Abs: 0.3 10*3/uL — ABNORMAL LOW (ref 0.7–4.0)
MCH: 31.3 pg (ref 26.0–34.0)
MCHC: 33 g/dL (ref 30.0–36.0)
MCV: 94.9 fL (ref 80.0–100.0)
Monocytes Absolute: 0.7 10*3/uL (ref 0.1–1.0)
Monocytes Relative: 8 %
Neutro Abs: 7.6 10*3/uL (ref 1.7–7.7)
Neutrophils Relative %: 88 %
Platelets: 148 10*3/uL — ABNORMAL LOW (ref 150–400)
RBC: 3.9 MIL/uL (ref 3.87–5.11)
RDW: 12.9 % (ref 11.5–15.5)
WBC: 8.7 10*3/uL (ref 4.0–10.5)
nRBC: 0 % (ref 0.0–0.2)

## 2022-06-26 LAB — RESP PANEL BY RT-PCR (RSV, FLU A&B, COVID)  RVPGX2
Influenza A by PCR: NEGATIVE
Influenza B by PCR: NEGATIVE
Resp Syncytial Virus by PCR: NEGATIVE
SARS Coronavirus 2 by RT PCR: NEGATIVE

## 2022-06-26 LAB — LACTIC ACID, PLASMA: Lactic Acid, Venous: 1.6 mmol/L (ref 0.5–1.9)

## 2022-06-26 LAB — GLUCOSE, CAPILLARY: Glucose-Capillary: 299 mg/dL — ABNORMAL HIGH (ref 70–99)

## 2022-06-26 MED ORDER — ACETAMINOPHEN 325 MG PO TABS: 325.0000 mg | ORAL_TABLET | Freq: Four times a day (QID) | ORAL | Status: DC | PRN

## 2022-06-26 MED ORDER — ALBUTEROL SULFATE (2.5 MG/3ML) 0.083% IN NEBU: 3.0000 mL | INHALATION_SOLUTION | Freq: Four times a day (QID) | RESPIRATORY_TRACT | Status: DC | PRN

## 2022-06-26 MED ORDER — VALACYCLOVIR HCL 500 MG PO TABS: 1000.0000 mg | ORAL_TABLET | Freq: Every day | ORAL | Status: DC | PRN

## 2022-06-26 MED ORDER — IPRATROPIUM-ALBUTEROL 0.5-2.5 (3) MG/3ML IN SOLN: 3.0000 mL | Freq: Four times a day (QID) | RESPIRATORY_TRACT | Status: DC

## 2022-06-26 MED ORDER — LIDOCAINE-PRILOCAINE 2.5-2.5 % EX CREA: 1.0000 | TOPICAL_CREAM | CUTANEOUS | Status: DC | PRN

## 2022-06-26 MED ORDER — APIXABAN 2.5 MG PO TABS
5.0000 mg | ORAL_TABLET | Freq: Two times a day (BID) | ORAL | Status: DC
  Administered 2022-06-26 – 2022-06-29 (×6): 5 mg via ORAL
  Filled 2022-06-26 (×6): qty 2

## 2022-06-26 MED ORDER — SODIUM CHLORIDE 0.9 % IV SOLN
2.0000 g | INTRAVENOUS | Status: DC
  Administered 2022-06-26 – 2022-06-28 (×3): 2 g via INTRAVENOUS
  Filled 2022-06-26 (×3): qty 20

## 2022-06-26 MED ORDER — OXYCODONE-ACETAMINOPHEN 7.5-325 MG PO TABS
1.0000 | ORAL_TABLET | Freq: Four times a day (QID) | ORAL | Status: DC | PRN
  Administered 2022-06-26 – 2022-06-28 (×6): 1 via ORAL
  Filled 2022-06-26 (×6): qty 1

## 2022-06-26 MED ORDER — ALPRAZOLAM 0.5 MG PO TABS
0.5000 mg | ORAL_TABLET | Freq: Every day | ORAL | Status: DC
  Administered 2022-06-26 – 2022-06-28 (×3): 0.5 mg via ORAL
  Filled 2022-06-26 (×3): qty 1

## 2022-06-26 MED ORDER — LEVOTHYROXINE SODIUM 125 MCG PO TABS
125.0000 ug | ORAL_TABLET | Freq: Every day | ORAL | Status: DC
  Administered 2022-06-27 – 2022-06-29 (×3): 125 ug via ORAL
  Filled 2022-06-26 (×3): qty 1

## 2022-06-26 MED ORDER — SODIUM CHLORIDE 0.9 % IV SOLN: INTRAVENOUS | Status: DC

## 2022-06-26 MED ORDER — ALPRAZOLAM 0.5 MG PO TABS: 0.5000 mg | ORAL_TABLET | Freq: Three times a day (TID) | ORAL | Status: DC | PRN

## 2022-06-26 MED ORDER — FLUOXETINE HCL 20 MG PO CAPS
20.0000 mg | ORAL_CAPSULE | Freq: Every day | ORAL | Status: DC
  Administered 2022-06-27 – 2022-06-29 (×3): 20 mg via ORAL
  Filled 2022-06-26 (×4): qty 1

## 2022-06-26 MED ORDER — SODIUM CHLORIDE 0.9% FLUSH: 10.0000 mL | INTRAVENOUS | Status: DC | PRN

## 2022-06-26 MED ORDER — METFORMIN HCL ER 500 MG PO TB24
500.0000 mg | ORAL_TABLET | Freq: Two times a day (BID) | ORAL | Status: DC
  Filled 2022-06-26: qty 1

## 2022-06-26 MED ORDER — ACETAMINOPHEN 325 MG PO TABS
325.0000 mg | ORAL_TABLET | Freq: Four times a day (QID) | ORAL | Status: DC | PRN
  Administered 2022-06-26 – 2022-06-29 (×4): 325 mg via ORAL
  Filled 2022-06-26 (×4): qty 1

## 2022-06-26 MED ORDER — SODIUM CHLORIDE 0.9 % IV SOLN: Freq: Once | INTRAVENOUS | Status: AC

## 2022-06-26 MED ORDER — IPRATROPIUM-ALBUTEROL 0.5-2.5 (3) MG/3ML IN SOLN
3.0000 mL | Freq: Four times a day (QID) | RESPIRATORY_TRACT | Status: DC | PRN
  Administered 2022-06-27: 3 mL via RESPIRATORY_TRACT

## 2022-06-26 MED ORDER — SODIUM CHLORIDE 0.9 % IV SOLN
500.0000 mg | INTRAVENOUS | Status: DC
  Administered 2022-06-26 – 2022-06-28 (×3): 500 mg via INTRAVENOUS
  Filled 2022-06-26 (×4): qty 5

## 2022-06-26 MED ORDER — LACTATED RINGERS IV SOLN: INTRAVENOUS | Status: DC

## 2022-06-26 MED ORDER — INSULIN ASPART 100 UNIT/ML IJ SOLN
0.0000 [IU] | Freq: Three times a day (TID) | INTRAMUSCULAR | Status: DC
  Administered 2022-06-26: 8 [IU] via SUBCUTANEOUS
  Administered 2022-06-27: 5 [IU] via SUBCUTANEOUS
  Administered 2022-06-27: 8 [IU] via SUBCUTANEOUS
  Administered 2022-06-27: 2 [IU] via SUBCUTANEOUS
  Administered 2022-06-27: 8 [IU] via SUBCUTANEOUS
  Administered 2022-06-28: 5 [IU] via SUBCUTANEOUS
  Administered 2022-06-28: 2 [IU] via SUBCUTANEOUS
  Administered 2022-06-28: 3 [IU] via SUBCUTANEOUS

## 2022-06-26 MED ORDER — CHLORHEXIDINE GLUCONATE CLOTH 2 % EX PADS
6.0000 | MEDICATED_PAD | Freq: Every day | CUTANEOUS | Status: DC
  Administered 2022-06-27 – 2022-06-29 (×3): 6 via TOPICAL

## 2022-06-26 NOTE — ED Triage Notes (Addendum)
Pt presents with confusion. Hx of terminal lung cancer. Diarrhea and vomiting and coughing this morning. Fever in triage. She took tylenol before coming. She appears to be sleepy and lethargic.

## 2022-06-26 NOTE — Assessment & Plan Note (Signed)
-  dx in December 2023 -continue Eliquis

## 2022-06-26 NOTE — H&P (Signed)
History and Physical    PatientMarland Kitchen [name] [id number] DOB: 07/04/1950 DOA: 06/26/2022 DOS: the patient was seen and examined on 06/26/2022 PCP: [name], DO  Patient coming from:  [facility] ED transfer  Chief Complaint:  Chief Complaint  Patient presents with   Altered Mental Status   HPI: [name] is a 72 y.o. 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  Has not felt well for about a week. Had runny nose, congestion and productive cough. Had a day of fever. Husband also had similar symptoms shortly after. Had decrease appetite and oral intake. No nausea, vomiting or diarrhea. Today she became more short of breath and according to husband was more confused about where she placed certain things around the house so decided to call EMS.   She was last hospitalized from 03/02/2022-03/12/2022 with acute hypoxic respiratory failure from multifactorial cause of pneumonia, radiation pneumonitis and new PE. She was treated with antibiotics and steroids. She was discharged home on O2 as she still had hypoxia with ambulation. Has been using 2L nightly since then.   In the ED, she was afebrile, BP borderline at 90/63 and on 2L via [place].  No leukocytosis or anemia. Platelet slight low at 148.  Hyponatremia of 129 with glucose of 265. K of 3.5 and normal creatinine at 0.69.   CXR showing possible Left lower lobe pneumonia.  CT head was negative.  Review of Systems: As mentioned in the history of present illness. All other systems reviewed and are negative. Past Medical History:  Diagnosis Date   Arthritis    Cancer ([place])    Diabetes mellitus    Esophagitis    Goals of care, counseling/discussion 05/23/2021   Hiatal hernia    History of radiation therapy     Left Lung- 11/27/21-01/08/22- Dr. 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[place]) 06/06/2021   Small cell lung cancer, left upper lobe ([place]) 06/06/2021   Thyroid disease    TMJ (sprain of temporomandibular joint)    Past Surgical History:  Procedure Laterality Date   ABDOMINAL HYSTERECTOMY     BACK SURGERY     x3   BACK SURGERY     x 5 , 2 neck surgeries, 2 lower back surgeries   BRONCHIAL BIOPSY  06/02/2021   Procedure: BRONCHIAL BIOPSIES;  Surgeon: Collene Gobble, MD;  Location: [place];  Service: Pulmonary;;   BRONCHIAL BRUSHINGS  06/02/2021   Procedure: BRONCHIAL BRUSHINGS;  Surgeon: Collene Gobble, MD;  Location: [place];  Service: Pulmonary;;   BRONCHIAL NEEDLE ASPIRATION BIOPSY  06/02/2021   Procedure: BRONCHIAL NEEDLE ASPIRATION BIOPSIES;  Surgeon: Collene Gobble, MD;  Location: [facility];  Service: Pulmonary;;   CHOLECYSTECTOMY     COLONOSCOPY     ERCP     ESOPHAGOGASTRODUODENOSCOPY     IR IMAGING GUIDED PORT INSERTION  06/13/2021   THYROID SURGERY     TONSILECTOMY, ADENOIDECTOMY, BILATERAL MYRINGOTOMY AND TUBES     VIDEO BRONCHOSCOPY WITH ENDOBRONCHIAL ULTRASOUND Left 06/02/2021  Procedure: VIDEO BRONCHOSCOPY WITH ENDOBRONCHIAL ULTRASOUND;  Surgeon: Collene Gobble, MD;  Location: [facility] ENDOSCOPY;  Service: Pulmonary;  Laterality: Left;   Social History:  reports that she quit smoking about 5 years ago. Her smoking use included cigarettes. She has a 48.00 pack-year smoking history. She has never used smokeless tobacco. She reports that she does not drink alcohol and does not use drugs.  Allergies  Allergen Reactions   Hydrocodone-Acetaminophen Other (See Comments)    Nightmares, skin crawling   Lyrica [Pregabalin] Other (See  Comments)    Hallucinations    Crestor [Rosuvastatin Calcium] Other (See Comments)    Leg cramps    Family History  Problem Relation Age of Onset   Stroke Mother    Alcohol abuse Mother    Cancer Brother        leukemia   Stroke Maternal Grandmother    Heart disease Maternal Grandfather    Cancer Daughter        breast cancer   Breast cancer Daughter    Breast cancer Cousin    Colon cancer Neg Hx    Esophageal cancer Neg Hx    Rectal cancer Neg Hx    Stomach cancer Neg Hx     Prior to Admission medications   Medication Sig Start Date End Date Taking? Authorizing Provider  acetaminophen (TYLENOL) 325 MG tablet Take 1 tablet (325 mg total) by mouth every 6 (six) hours as needed for moderate pain. 03/12/22  Yes Dwyane Dee, MD  albuterol (VENTOLIN HFA) 108 (90 Base) MCG/ACT inhaler Inhale 2 puffs into the lungs every 6 (six) hours as needed for wheezing or shortness of breath. 04/02/20  Yes Paz, Alda Berthold, MD  ALPRAZolam Duanne Moron) 0.5 MG tablet Take 1 tablet (0.5 mg total) by mouth 3 (three) times daily as needed. 05/21/22  Yes [name], DO  apixaban (ELIQUIS) 5 MG TABS tablet Take 1 tablet (5 mg total) by mouth 2 (two) times daily. 05/07/22  Yes [name], MD  blood glucose meter kit and supplies KIT Use daily 05/19/21  Yes Roma Schanz R, DO  Blood Glucose Monitoring Suppl (ONE TOUCH ULTRA 2) w/Device KIT Check blood sugar as directed. Dx:E11.65 09/26/18  Yes Roma Schanz R, DO  Blood Glucose Monitoring Suppl ([place]) w/Device KIT USE AS DIRECTED TO CHECK BLOOD SUGAR FOUR TIMES A DAY Patient taking differently: 1 each by Other route 4 (four) times daily. 11/30/19  Yes Roma Schanz R, DO  FLUoxetine (PROZAC) 20 MG capsule Take 1 capsule (20 mg total) by mouth daily. 02/21/21  Yes Roma Schanz R, DO  glucose blood (ONETOUCH VERIO) test strip USE ONE STRIP TO TEST DAILY Patient taking differently: 1 each by Other route daily.  04/08/22  Yes Roma Schanz R, DO  insulin glargine (LANTUS SOLOSTAR) 100 UNIT/ML Solostar Pen Inject 30 Units into the skin daily. HS Patient taking differently: Inject 30 Units into the skin daily. 06/02/21  Yes Collene Gobble, MD  Insulin Pen Needle (NOVOTWIST) 32G X 5 MM MISC Use daily at Bedtime with Lantus 02/17/12  Yes Roma Schanz R, DO  ipratropium-albuterol (DUONEB) 0.5-2.5 (3) MG/3ML SOLN Take 3 mLs by nebulization every 6 (six) hours as needed. 03/27/22  Yes [name], DO  levothyroxine (SYNTHROID) 125 MCG tablet Take 1 tablet (125 mcg total) by mouth daily before breakfast. 10/09/21  Yes Ennever, Rudell Cobb, MD  lidocaine-prilocaine (EMLA) cream Apply 1 Application topically  as needed. 04/09/22  Yes [name], MD  meloxicam (MOBIC) 15 MG tablet TAKE 1 TABLET BY MOUTH DAILY 04/03/22  Yes Roma Schanz R, DO  metFORMIN (GLUCOPHAGE-XR) 500 MG 24 hr tablet Take 1 tablet (500 mg total) by mouth in the morning and at bedtime. 01/12/22  Yes Mosie Lukes, MD  omeprazole (PRILOSEC) 20 MG capsule Take 1 capsule (20 mg total) by mouth daily. Patient taking differently: Take 20 mg by mouth at bedtime. 09/02/21  Yes [name], DO  oxyCODONE-acetaminophen (PERCOCET) 7.5-325 MG tablet Take 1 tablet by mouth every 6 (six) hours as needed for severe pain. 06/12/22  Yes [name], MD  TRULICITY [id number] SOPN Inject 0.75 mg into the skin every Saturday. 03/14/21  Yes [provider]  valACYclovir (VALTREX) 1000 MG tablet TAKE ONE TABLET BY MOUTH DAILY AS NEEDED Patient taking differently: Take 1,000 mg by mouth daily as needed (breakout). 03/27/20  Yes Roma Schanz R, DO  dexamethasone (DECADRON) 4 MG tablet Take 2 tablets (8 mg total) by mouth daily. Start the day after chemotherapy for 2 days. Take with food. Patient not taking: Reported on 06/26/2022 04/15/22   [name], MD  meclizine (ANTIVERT) 12.5 MG tablet Take 1 tablet (12.5  mg total) by mouth every 6 (six) hours as needed for dizziness. Patient not taking: Reported on 06/26/2022 04/16/22   [name], MD  methylPREDNISolone (MEDROL DOSEPAK) 4 MG TBPK tablet 1 pack Patient not taking: Reported on 06/26/2022 06/03/22   [name], MD  oxyCODONE ER [facility] ER) 13.5 MG C12A Take 13.5 mg by mouth 2 (two) times daily. Patient not taking: Reported on 06/26/2022 12/11/21   [name], MD  tiZANidine (ZANAFLEX) 4 MG tablet Take 1 tablet (4 mg total) by mouth every 6 (six) hours as needed for muscle spasms. Patient not taking: Reported on 06/26/2022 06/05/22   Shelda Pal, DO  prochlorperazine (COMPAZINE) 10 MG tablet Take 1 tablet (10 mg total) by mouth every 6 (six) hours as needed (Nausea or vomiting). Patient not taking: Reported on 11/14/2021 06/09/21 11/14/21  [name], MD    Physical Exam: Vitals:   06/26/22 1700 06/26/22 1715 06/26/22 1730 06/26/22 1827  BP: 100/63 95/66 90/63  129/74  Pulse: (!) 102 (!) 101 100 (!) 106  Resp: 20 16 19 18   Temp:    97.8 F (36.6 C)  TempSrc:    Oral  SpO2: 98% 97% 97% 99%  Weight:      Height:       Constitutional: NAD, calm, comfortable, non toxic appearing elderly female sitting upright in bed Eyes: lids and conjunctivae normal ENMT: Mucous membranes are moist.  Neck: normal, supple Respiratory: right basilar crackles but no wheezing. Normal respiratory effort and able to speak in full sentences. No accessory muscle use. On 2L via [place].  Cardiovascular: Regular rate and rhythm, no murmurs / rubs / gallops. No extremity edema.  Abdomen: no tenderness, soft Musculoskeletal: no clubbing / cyanosis. No joint deformity upper and lower extremities. Good ROM, no contractures. Normal muscle tone.  Skin: no rashes, lesions, ulcers.  Neurologic: CN 2-12 grossly intact.  Strength 5/5 in all 4.  Psychiatric: Normal judgment and insight. Alert and oriented x 3. Normal mood. 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 Right hepatic lobe mass and left adrenal nodule have increased in size but decreased in hypermetabolism.' --s/p radiation therapy and previously on chemotherapy and immunotherapy with oncologist Dr. Marin Olp. Given new imaging showing progression of disease, may just proceed with palliative radiation for spinal metastasis.   Type 2 diabetes mellitus without complication, with long-term current use of insulin (HCC) -A1c 10.4% on 02/26/2022  -Home regimen on 30 units of Lantus at night time -will start with moderate SSI given decrease oral intake at this time  Hypothyroidism -continue synthroid   Hypotension -due to decrease intake. Keep on IV fluids overnight. -not on antihypertensives at home  Hyponatremia -Corrected Na at 132. Likely due to decrease intake -keep on continuous IV fluid overnight      Advance Care Planning:   Code Status: DNR   Consults: NONE  Family Communication: Husband at bedside  Severity of Illness: The appropriate patient status for this patient is INPATIENT. Inpatient status is  judged to be reasonable and necessary in order to provide the required intensity of service to ensure the patient's safety. The patient's presenting symptoms, physical exam findings, and initial radiographic and laboratory data in the context of their chronic comorbidities is felt to place them at high risk for further clinical deterioration. Furthermore, it is not anticipated that the patient will be medically stable for discharge from the hospital within 2 midnights of admission.   * I certify that at the point of admission it is my clinical judgment that the patient will require inpatient hospital care spanning beyond 2 midnights from the point of admission due to high intensity of service, high risk for further deterioration and high frequency of surveillance required.*  Author: Orene Desanctis, DO 06/26/2022 7:59 PM  For on call review [email or web address].

## 2022-06-26 NOTE — ED Provider Notes (Signed)
Emergency Department Provider Note   I have reviewed the triage vital signs and the nursing notes.   HISTORY  Chief Complaint Altered Mental Status   HPI [name] is a 72 y.o. female past history of metastatic small cell lung carcinoma presents to the emergency department with fever, fatigue, vomiting/diarrhea.  Symptoms developing over the past 24 to 48 hours.  Last chemotherapy was 4 to 5 weeks ago.  She denies chest or abdominal pain.  She has known history of PE and is anticoagulated.  She is compliant with her home medications.  Family described her as being weak, unable to stand, slower than normal.  Patient does not feel particularly confused but does recognize these symptoms.  Denies any dysuria, hesitancy, urgency. Has had some increased cough as well.    Past Medical History:  Diagnosis Date   Arthritis    Cancer ([place])    Diabetes mellitus    Esophagitis    Goals of care, counseling/discussion 05/23/2021   Hiatal hernia    History of radiation therapy    Left Lung- 11/27/21-01/08/22- Dr. 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[place]) 06/06/2021   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[place]) 06/06/2021   Small cell lung cancer, left upper lobe ([place]) 06/06/2021   Thyroid disease    TMJ (sprain of temporomandibular joint)     Review of Systems  Constitutional: Positive fever/chills and fatigue.  Cardiovascular: Denies chest pain. Respiratory: Positive shortness of breath. Positive cough.  Gastrointestinal: No abdominal pain.  Positive nausea, vomiting, and diarrhea.  No constipation. Genitourinary: Negative for dysuria. Musculoskeletal: Negative for back pain. Skin: Negative for rash. Neurological: Negative for headaches, focal weakness or  numbness.  ____________________________________________   PHYSICAL EXAM:  VITAL SIGNS: ED Triage Vitals [06/26/22 1325]  Enc Vitals Group     BP 114/68     Pulse Rate (!) 135     Resp (!) 22     Temp 99.3 F (37.4 C)     Temp Source Oral     SpO2 92 %     Weight 122 lb (55.3 kg)     Height 4\' 10"  (1.473 m)   Constitutional: Alert and oriented. Well appearing and in no acute distress. Eyes: Conjunctivae are normal. Head: Atraumatic. Nose: No congestion/rhinnorhea. Mouth/Throat: Mucous membranes are moist.  Neck: No stridor.   Cardiovascular: Tachycardia. Good peripheral circulation. Grossly normal heart sounds.   Respiratory: Increased respiratory effort.  No retractions. Lungs with rhonchi at the left base. No significant wheezing.  Gastrointestinal: Soft and nontender. No distention.  Musculoskeletal: No gross deformities of extremities. Neurologic:  Normal speech and language.  Skin:  Skin is warm, dry and intact. No rash noted.   ____________________________________________   LABS (all labs ordered are listed, but only abnormal results are displayed)  Labs Reviewed  RESPIRATORY PANEL BY PCR - Abnormal; Notable for the following components:      Result Value   Metapneumovirus DETECTED (*)    All other components within normal limits  COMPREHENSIVE METABOLIC PANEL - Abnormal; Notable for the following components:   Sodium 129 (*)    Chloride 95 (*)    Glucose, Bld 265 (*)    Calcium 8.6 (*)    All other components within  normal limits  CBC WITH DIFFERENTIAL/PLATELET - Abnormal; Notable for the following components:   Platelets 148 (*)    Lymphs Abs 0.3 (*)    All other components within normal limits  BASIC METABOLIC PANEL - Abnormal; Notable for the following components:   Potassium 3.0 (*)    Calcium 8.0 (*)    All other components within normal limits  GLUCOSE, CAPILLARY - Abnormal; Notable for the following components:   Glucose-Capillary 299 (*)     All other components within normal limits  RESP PANEL BY RT-PCR (RSV, FLU A&B, COVID)  RVPGX2  CULTURE, BLOOD (ROUTINE X 2)  CULTURE, BLOOD (ROUTINE X 2)  LACTIC ACID, PLASMA  PROTIME-INR  URINALYSIS, ROUTINE W REFLEX MICROSCOPIC  LACTIC ACID, PLASMA   ____________________________________________  EKG   EKG Interpretation  Date/Time:  Friday June 26 2022 13:43:53 EDT Ventricular Rate:  129 PR Interval:  131 QRS Duration: 83 QT Interval:  328 QTC Calculation: 481 R Axis:   87 Text Interpretation: Sinus tachycardia Supraventricular bigeminy Borderline right axis deviation Low voltage, precordial leads Confirmed by Nanda Quinton [phone]) on 06/26/2022 1:45:52 PM        ____________________________________________  RADIOLOGY  CT Head Wo Contrast  Result Date: 06/26/2022 CLINICAL DATA:  Altered mental status, confusion, history of lung carcinoma EXAM: CT HEAD WITHOUT CONTRAST TECHNIQUE: Contiguous axial images were obtained from the base of the skull through the vertex without intravenous contrast. RADIATION DOSE REDUCTION: This exam was performed according to the departmental dose-optimization program which includes automated exposure control, adjustment of the mA and/or kV according to patient size and/or use of iterative reconstruction technique. COMPARISON:  Previous studies including CT brain done on 11/30/2017 and MR brain done on 04/22/2022 FINDINGS: Brain: No acute intracranial findings are seen. Ventricles are not dilated. There is no focal edema or mass effect. There are no signs of bleeding within the cranium. Vascular: Unremarkable. Skull: No focal lytic lesions are seen. Sinuses/Orbits: Mucosal thickening is seen in ethmoid and maxillary sinuses. There are fewer than usual mastoid air cells. Other: None. IMPRESSION: No acute intracranial findings are seen. There are no signs of bleeding within the cranium. There is no focal edema or mass effect. Electronically Signed   By:  Elmer Picker M.D.   On: 06/26/2022 15:06   DG Chest Port 1 View  Result Date: 06/26/2022 CLINICAL DATA:  Confusion. History of lung cancer. Diarrhea, vomiting, coughing this morning. EXAM: PORTABLE CHEST 1 VIEW COMPARISON:  Chest radiograph 05/05/2022, 03/11/2022; PET-CT 06/19/2022 FINDINGS: Right IJ power injectable port central venous catheter tip projects at the level of the superior cavoatrial junction. Retrocardiac airspace opacity corresponds to the left lower lobe consolidation recent PET-CT 06/19/2022. Right basilar subsegmental atelectasis. No pleural effusion or pneumothorax. The heart size and mediastinal contours are within normal limits. Stable appearance of the anterior right fourth rib. No acute osseous abnormality. Partially visualized cervical spinal fixation. IMPRESSION: 1. Retrocardiac airspace opacity corresponds to the left lower lobe consolidation recent PET-CT 06/19/2022, possibly reflecting pneumonia in the appropriate clinical context versus posttreatment collapse of the medial left lower lobe as described on recent PET-CT report. 2. Right basilar subsegmental atelectasis. Electronically Signed   By: Ileana Roup M.D.   On: 06/26/2022 14:09    ____________________________________________   PROCEDURES  Procedure(s) performed:   Procedures  CRITICAL CARE Performed by: Margette Fast Total critical care time: 35 minutes Critical care time was exclusive of separately billable procedures and treating other patients. Critical care was necessary to  treat or prevent imminent or life-threatening deterioration. Critical care was time spent personally by me on the following activities: development of treatment plan with patient and/or surrogate as well as nursing, discussions with consultants, evaluation of patient's response to treatment, examination of patient, obtaining history from patient or surrogate, ordering and performing treatments and interventions, ordering and  review of laboratory studies, ordering and review of radiographic studies, pulse oximetry and re-evaluation of patient's condition.  Nanda Quinton, MD Emergency Medicine  ____________________________________________   INITIAL IMPRESSION / ASSESSMENT AND PLAN / ED COURSE  Pertinent labs & imaging results that were available during my care of the patient were reviewed by me and considered in my medical decision making (see chart for details).   This patient is Presenting for Evaluation of SOB, which does require a range of treatment options, and is a complaint that involves a high risk of morbidity and mortality.  The Differential Diagnoses includes but is not exclusive to acute coronary syndrome, aortic dissection, pulmonary embolism, cardiac tamponade, community-acquired pneumonia, pericarditis, musculoskeletal chest wall pain, etc.   Critical Interventions-    Medications  cefTRIAXone (ROCEPHIN) 2 g in sodium chloride 0.9 % 100 mL IVPB (0 g Intravenous Stopped 06/26/22 1525)  azithromycin (ZITHROMAX) 500 mg in sodium chloride 0.9 % 250 mL IVPB (0 mg Intravenous Stopped 06/26/22 1630)  oxyCODONE-acetaminophen (PERCOCET) 7.5-325 MG per tablet 1 tablet (1 tablet Oral Given 06/26/22 2122)  FLUoxetine (PROZAC) capsule 20 mg (20 mg Oral Not Given 06/26/22 2000)  levothyroxine (SYNTHROID) tablet 125 mcg (125 mcg Oral Given 06/27/22 0529)  apixaban (ELIQUIS) tablet 5 mg (5 mg Oral Given 06/26/22 2122)  albuterol (PROVENTIL) (2.5 MG/3ML) 0.083% nebulizer solution 3 mL (has no administration in time range)  ipratropium-albuterol (DUONEB) 0.5-2.5 (3) MG/3ML nebulizer solution 3 mL (has no administration in time range)  lidocaine-prilocaine (EMLA) cream 1 Application (has no administration in time range)  ALPRAZolam Duanne Moron) tablet 0.5 mg (0.5 mg Oral Given 06/26/22 2122)  acetaminophen (TYLENOL) tablet 325 mg (325 mg Oral Given 06/27/22 0651)  0.9 %  sodium chloride infusion ( Intravenous New Bag/Given  06/26/22 1951)  insulin aspart (novoLOG) injection 0-15 Units (8 Units Subcutaneous Given 06/26/22 2137)  sodium chloride flush (NS) 0.9 % injection 10-40 mL (has no administration in time range)  Chlorhexidine Gluconate Cloth 2 % PADS 6 each (has no administration in time range)  0.9 %  sodium chloride infusion ( Intravenous New Bag/Given 06/26/22 1439)  sodium chloride 0.9 % bolus 250 mL (250 mLs Intravenous New Bag/Given 06/27/22 0701)    Reassessment after intervention: Symptoms improved.    I decided to review pertinent External Data, and in summary patient followed by Dr. Marin Olp with last note from 05/29/22.    Clinical Laboratory Tests Ordered, included COVID and flu negative.  No leukocytosis.  Mild hyponatremia, likely pseudohyponatremia with hyperglycemia.  No DKA.  Radiologic Tests Ordered, included CXR and CT head. I independently interpreted the images and agree with radiology interpretation.   Cardiac Monitor Tracing which shows sinus tachycardia.    Social Determinants of Health Risk patient is not an active smoker.   Consult complete with Oncology, Dr. Marin Olp who will be down to see the patient.   TRH with plan for admit.   Medical Decision Making: Summary:  Patient presents emergency department with fever, cough, fatigue.  Some possible confusion noticed by family members earlier.  Also with vomiting and diarrhea.  Abdomen is diffusely soft and nontender.  Do not have plans for immediate abdominal  imaging.  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  No focal neurodeficits.  Chest x-ray concerning for possible pneumonia.   Reevaluation with update and discussion with patient. Plan for admit. She is in agreement.   Patient's presentation is most consistent with acute presentation with potential threat to life or bodily function.   Disposition:  admit  ____________________________________________  FINAL CLINICAL IMPRESSION(S) / ED DIAGNOSES  Final diagnoses:  Community acquired pneumonia of left lower lobe of lung  Hypoxemia  Somnolence     Note:  This document was prepared using Dragon voice recognition software and may include unintentional dictation errors.  Nanda Quinton, MD, [facility] Emergency Medicine    Lilybeth Vien, W[facility], MD 06/27/22 [phone]

## 2022-06-26 NOTE — Assessment & Plan Note (Addendum)
-  A1c 10.4% on 02/26/2022  -Home regimen on 30 units of Lantus at night time -will start with moderate SSI given decrease oral intake at this time

## 2022-06-26 NOTE — Progress Notes (Signed)
Brief note: Patient is a 72 year old female with metastatic small cell cancer and diabetes mellitus.  Patient presents with worsening shortness of breath, confusion and weakness.  There are concerns for possible pneumonia.  Patient has been put on IV Rocephin and Zithromax.  Patient will be transferred to [facility] for further assessment and management.

## 2022-06-26 NOTE — Assessment & Plan Note (Signed)
-  continue synthroid 

## 2022-06-26 NOTE — ED Notes (Signed)
Pt noted to be 87% on room air. Placed pt on 3L via nasal cannula

## 2022-06-26 NOTE — Assessment & Plan Note (Signed)
-  Corrected Na at 132. Likely due to decrease intake -keep on continuous IV fluid overnight

## 2022-06-26 NOTE — Assessment & Plan Note (Addendum)
-  multifactorial from progression of lung cancer and pneumonia -Baseline on 2L nightly but now requiring 2L consistently  -Flu/COVID/RSV negative but symptoms suspicious for viral pneumonia so will test full respiratory viral panel -continue IV Rocephin and Azithromycin  -continue to wean back to night time O2 as tolerated

## 2022-06-26 NOTE — Consult Note (Signed)
[name] is well-known to me.  She is a very nice 72 year old white female.  She has metastatic small cell carcinoma of the lung.  Is already been through 2 lines of therapy.  She received her last monotherapy-lurbinectedin-on IV 05/29/2022.  She did have a recent PET scan which unfortunately showed that she does have progressive disease.  She did have a lot of back pain.  She does have spinal involvement.  We may have to think about doing radiotherapy to the spine.  She now comes to the ER with confusion and weakness.  This started today.  She has some nausea and vomiting.  I do not think she had any kind of fever.  There is no dysuria or hematuria.  She has some diarrhea.  She has been on Eliquis for pulmonary embolism.  She has done well with this.  She had a CT of the brain.  This was unremarkable for any obvious metastatic disease.  Chest x-ray did show the possibility of left lower lung pneumonia.  She does have little bit of a cough.  It is nonproductive.  She has been eating okay.  She does have diabetes.  There was no double vision or blurred vision.  Patient has constant complaint of pain in her legs.  She clearly has weakened since we last saw her.   On her physical exam, temperature is 99.3.  Pulse 125.  Blood pressure 101/74.  Oxygen saturation is 96%.  Her weight is 122 pounds.  Her head neck exam shows no ocular or oral lesions.  She has no adenopathy in the neck.  Lungs are clear bilaterally.  There may be some wheezing bilaterally.  Cardiac exam is tachycardic but regular.  She has no murmurs, rubs or bruits.  Abdomen is soft.  Bowel sounds are decreased but present.  She has no fluid wave.  There is no palpable liver or spleen tip.  Extremity shows no clubbing, cyanosis or edema.  Neurological exam shows no focal neurological dose.  She is relatively alert and oriented.  Skin exam shows no rashes, ecchymosis or petechia.   Her labs show CBC with a white cell count of 8.7.   Hemoglobin 12.2.  Platelet count 148,000.  Her sodium is 129.  Potassium 3.5.  BUN 15 creatinine 0.69.  Blood sugar 265.  Calcium 8.6 with an albumin of 3.8.  Her LFTs are normal.   [name] is a 72 year old white female.  She has extensive stage small cell lung cancer.  She has progressed despite 2 lines of therapy.  Her options are not that great.  I think that if we try her on chemotherapy, she really has been better shape.  I does worry that she is strong enough to take any additional chemotherapy.  I am not sure what might be going on right now.  She sounds like she may have pneumonia.  She will be on IV antibiotics.  We will follow her along the hospital.  We may think about try to get Radiation Oncology involved for her spinal disease.  This may help with some of her pain.  I know that she will get incredible care down in the ER add [facility].  I appreciate everybody's help.   [name], MD  Psalm 119:50

## 2022-06-26 NOTE — ED Notes (Signed)
Patient transported to CT 

## 2022-06-26 NOTE — Assessment & Plan Note (Addendum)
-  recently had repeat PET scan on 3/15 showing 'Overall progression in extensive stage squamous cell carcinoma with new hypermetabolic mediastinal adenopathy, progressive hypermetabolic pleural nodularity in the left hemithorax, enlarging hypermetabolic gastrohepatic ligament lymph node and new/increasing the hypermetabolic osseous lesions. Right hepatic lobe mass and left adrenal nodule have increased in size but decreased in hypermetabolism.' --s/p radiation therapy and previously on chemotherapy and immunotherapy with oncologist Dr. Marin Olp. Given new imaging showing progression of disease, may just proceed with palliative radiation for spinal metastasis.

## 2022-06-26 NOTE — Assessment & Plan Note (Signed)
-  due to decrease intake. Keep on IV fluids overnight. -not on antihypertensives at home

## 2022-06-27 ENCOUNTER — Inpatient Hospital Stay (HOSPITAL_COMMUNITY): Payer: PPO

## 2022-06-27 DIAGNOSIS — C3412 Malignant neoplasm of upper lobe, left bronchus or lung: Secondary | ICD-10-CM | POA: Diagnosis not present

## 2022-06-27 DIAGNOSIS — C787 Secondary malignant neoplasm of liver and intrahepatic bile duct: Secondary | ICD-10-CM | POA: Diagnosis not present

## 2022-06-27 DIAGNOSIS — J9601 Acute respiratory failure with hypoxia: Secondary | ICD-10-CM | POA: Diagnosis not present

## 2022-06-27 LAB — COMPREHENSIVE METABOLIC PANEL
ALT: 12 U/L (ref 0–44)
AST: 20 U/L (ref 15–41)
Albumin: 2.9 g/dL — ABNORMAL LOW (ref 3.5–5.0)
Alkaline Phosphatase: 43 U/L (ref 38–126)
Anion gap: 8 (ref 5–15)
BUN: 10 mg/dL (ref 8–23)
CO2: 24 mmol/L (ref 22–32)
Calcium: 7.7 mg/dL — ABNORMAL LOW (ref 8.9–10.3)
Chloride: 96 mmol/L — ABNORMAL LOW (ref 98–111)
Creatinine, Ser: 0.56 mg/dL (ref 0.44–1.00)
GFR, Estimated: >60 mL/min (ref >60)
Glucose, Bld: 211 mg/dL — ABNORMAL HIGH (ref 70–99)
Potassium: 3.4 mmol/L — ABNORMAL LOW (ref 3.5–5.1)
Sodium: 128 mmol/L — ABNORMAL LOW (ref 135–145)
Total Bilirubin: 0.6 mg/dL (ref 0.3–1.2)
Total Protein: 6.4 g/dL — ABNORMAL LOW (ref 6.5–8.1)

## 2022-06-27 LAB — PREALBUMIN: Prealbumin: 11 mg/dL — ABNORMAL LOW (ref 18–38)

## 2022-06-27 LAB — URINALYSIS, ROUTINE W REFLEX MICROSCOPIC
Bilirubin Urine: NEGATIVE
Glucose, UA: 150 mg/dL — AB
Ketones, ur: 20 mg/dL — AB
Nitrite: NEGATIVE
Protein, ur: 30 mg/dL — AB
Specific Gravity, Urine: 1.021 (ref 1.005–1.030)
pH: 5 (ref 5.0–8.0)

## 2022-06-27 LAB — CBC WITH DIFFERENTIAL/PLATELET
Abs Immature Granulocytes: 0.03 10*3/uL (ref 0.00–0.07)
Basophils Absolute: 0 10*3/uL (ref 0.0–0.1)
Basophils Relative: 0 %
Eosinophils Absolute: 0 10*3/uL (ref 0.0–0.5)
Eosinophils Relative: 0 %
HCT: 31.4 % — ABNORMAL LOW (ref 36.0–46.0)
Hemoglobin: 10.4 g/dL — ABNORMAL LOW (ref 12.0–15.0)
Immature Granulocytes: 0 %
Lymphocytes Relative: 9 %
Lymphs Abs: 0.8 10*3/uL (ref 0.7–4.0)
MCH: 31.9 pg (ref 26.0–34.0)
MCHC: 33.1 g/dL (ref 30.0–36.0)
MCV: 96.3 fL (ref 80.0–100.0)
Monocytes Absolute: 0.6 10*3/uL (ref 0.1–1.0)
Monocytes Relative: 7 %
Neutro Abs: 7.2 10*3/uL (ref 1.7–7.7)
Neutrophils Relative %: 84 %
Platelets: 116 10*3/uL — ABNORMAL LOW (ref 150–400)
RBC: 3.26 MIL/uL — ABNORMAL LOW (ref 3.87–5.11)
RDW: 12.9 % (ref 11.5–15.5)
WBC: 8.7 10*3/uL (ref 4.0–10.5)
nRBC: 0 % (ref 0.0–0.2)

## 2022-06-27 LAB — BASIC METABOLIC PANEL
Anion gap: 8 (ref 5–15)
BUN: 13 mg/dL (ref 8–23)
CO2: 26 mmol/L (ref 22–32)
Calcium: 8 mg/dL — ABNORMAL LOW (ref 8.9–10.3)
Chloride: 102 mmol/L (ref 98–111)
Creatinine, Ser: 0.46 mg/dL (ref 0.44–1.00)
GFR, Estimated: >60 mL/min (ref >60)
Glucose, Bld: 95 mg/dL (ref 70–99)
Potassium: 3 mmol/L — ABNORMAL LOW (ref 3.5–5.1)
Sodium: 136 mmol/L (ref 135–145)

## 2022-06-27 LAB — RESPIRATORY PANEL BY PCR

## 2022-06-27 LAB — TSH: TSH: 3.339 u[IU]/mL (ref 0.350–4.500)

## 2022-06-27 LAB — LACTATE DEHYDROGENASE: LDH: 187 U/L (ref 98–192)

## 2022-06-27 LAB — BRAIN NATRIURETIC PEPTIDE: B Natriuretic Peptide: 124.6 pg/mL — ABNORMAL HIGH (ref 0.0–100.0)

## 2022-06-27 LAB — PROCALCITONIN: Procalcitonin: 3.35 ng/mL

## 2022-06-27 LAB — GLUCOSE, CAPILLARY
Glucose-Capillary: 122 mg/dL — ABNORMAL HIGH (ref 70–99)
Glucose-Capillary: 215 mg/dL — ABNORMAL HIGH (ref 70–99)
Glucose-Capillary: 293 mg/dL — ABNORMAL HIGH (ref 70–99)
Glucose-Capillary: 254 mg/dL — ABNORMAL HIGH (ref 70–99)

## 2022-06-27 LAB — LACTIC ACID, PLASMA: Lactic Acid, Venous: 1.1 mmol/L (ref 0.5–1.9)

## 2022-06-27 MED ORDER — SODIUM CHLORIDE 0.9 % IV SOLN: INTRAVENOUS | Status: DC

## 2022-06-27 MED ORDER — SODIUM CHLORIDE 0.9 % IV BOLUS
250.0000 mL | Freq: Once | INTRAVENOUS | Status: AC
  Administered 2022-06-27: 250 mL via INTRAVENOUS

## 2022-06-27 MED ORDER — IPRATROPIUM-ALBUTEROL 0.5-2.5 (3) MG/3ML IN SOLN
3.0000 mL | Freq: Four times a day (QID) | RESPIRATORY_TRACT | Status: DC
  Administered 2022-06-27: 3 mL via RESPIRATORY_TRACT
  Filled 2022-06-27 (×2): qty 3

## 2022-06-27 MED ORDER — KETOROLAC TROMETHAMINE 15 MG/ML IJ SOLN
15.0000 mg | Freq: Once | INTRAMUSCULAR | Status: AC
  Administered 2022-06-27: 15 mg via INTRAVENOUS
  Filled 2022-06-27: qty 1

## 2022-06-27 MED ORDER — MIDODRINE HCL 5 MG PO TABS
5.0000 mg | ORAL_TABLET | Freq: Three times a day (TID) | ORAL | Status: DC
  Administered 2022-06-27 – 2022-06-29 (×7): 5 mg via ORAL
  Filled 2022-06-27 (×7): qty 1

## 2022-06-27 MED ORDER — FLUTICASONE PROPIONATE 50 MCG/ACT NA SUSP
1.0000 | Freq: Every day | NASAL | Status: DC
  Administered 2022-06-27 – 2022-06-29 (×3): 1 via NASAL
  Filled 2022-06-27: qty 16

## 2022-06-27 MED ORDER — INSULIN GLARGINE-YFGN 100 UNIT/ML ~~LOC~~ SOLN
25.0000 [IU] | Freq: Every day | SUBCUTANEOUS | Status: DC
  Administered 2022-06-27 – 2022-06-29 (×3): 25 [IU] via SUBCUTANEOUS
  Filled 2022-06-27 (×3): qty 0.25

## 2022-06-27 MED ORDER — GUAIFENESIN ER 600 MG PO TB12
600.0000 mg | ORAL_TABLET | Freq: Two times a day (BID) | ORAL | Status: DC
  Administered 2022-06-27 – 2022-06-29 (×5): 600 mg via ORAL
  Filled 2022-06-27 (×5): qty 1

## 2022-06-27 MED ORDER — POTASSIUM CHLORIDE CRYS ER 20 MEQ PO TBCR
40.0000 meq | EXTENDED_RELEASE_TABLET | ORAL | Status: AC
  Administered 2022-06-27 (×2): 40 meq via ORAL
  Filled 2022-06-27 (×2): qty 2

## 2022-06-27 NOTE — Progress Notes (Signed)
[name] is over on 5 E. now.  She is having a headache.  This is in the front part of the brain.  Her blood pressure is okay.  The CT of the brain that she had in the ER did not show any evidence of metastasis.  I will give her a dose of Toradol and then see if this may help her out.  She has pneumonia.  I think this is in the left lung.  She is on Rocephin and azithromycin.  She needs scheduled nebulizers.  I think this will help her out.  Her appetite is a little bit down.  There is no labs yet for today.  I do see that her respiratory viral panel shows that she is positive for Metapneumo virus.  I am not sure exactly what this is.  I do not know if this needs to be given a count of antiviral.  I will need to get Radiation Oncology involved on Monday to help with the spinal metastasis.  Hopefully they can give her some radiation.  It will be very difficult to try to treat her systemically for the progression of her small cell lung cancer.  I am just not sure what kind of shape she is in to be able to handle systemic chemotherapy.  I will send off a prealbumin.  I just 1 make sure you focus on quality of life right now.  I hate that she developed this pneumonia.  Somehow, I think this is somehow related to her underlying malignancy.  Currently, I would have said that her performance status is probably ECOG 2.  Her vital signs are temperature 102.4.  Pulse 133.  Blood pressure 134/77.  Her head and neck exam shows no oral lesions.  There is no adenopathy in the neck.  Lungs sound relatively clear bilaterally.  Cardiac exam tachycardic but regular.  There are no murmurs.  Abdomen is soft.  Bowel sounds are slightly decreased.  Neurological exam shows no focal neurological deficits.   She does have high temperature this morning.  She is on antibiotics.  We will have to see what cultures show.  Given that she has this viral positivity on her respiratory panel, I do not know if this would  increase her risk of bacterial pneumonia.  I would think that the temperature curve will be important.  I know that she will get incredible care from everybody on 5 E.  I do think everybody for their compassion.   [name], MD  [place] 21:9

## 2022-06-27 NOTE — Progress Notes (Signed)
Rt instructed patient on the use of a flutter valve. Patient able to demonstrate back good technique.

## 2022-06-27 NOTE — Progress Notes (Signed)
  Transition of Care (TOC) Screening Note   Patient Details  Name: [name] Date of Birth: 11/24/1950   [facility] CM/SW Contact:    Henrietta Dine, RN Phone Number: 06/27/2022, 11:23 AM    Transition of Care Department [facility]) has reviewed patient and no TOC needs have been identified at this time. We will continue to monitor patient advancement through interdisciplinary progression rounds. If new patient transition needs arise, please place a TOC consult.

## 2022-06-27 NOTE — Plan of Care (Signed)
  Problem: Clinical Measurements: Goal: Will remain free from infection Outcome: Progressing   Problem: Activity: Goal: Risk for activity intolerance will decrease Outcome: Progressing   Problem: Nutrition: Goal: Adequate nutrition will be maintained Outcome: Progressing   Problem: Coping: Goal: Level of anxiety will decrease Outcome: Progressing   

## 2022-06-27 NOTE — Progress Notes (Signed)
   06/27/22 0759  Assess: MEWS Score  Temp 100.2 F (37.9 C)  BP 107/63  MAP (mmHg) 76  Pulse Rate (!) 131  Resp 16  SpO2 95 %  O2 Device Nasal Cannula  O2 Flow Rate (L/min) 2 L/min  Assess: MEWS Score  MEWS Temp 0  MEWS Systolic 0  MEWS Pulse 3  MEWS RR 0  MEWS LOC 0  MEWS Score 3  MEWS Score Color Yellow  Assess: if the MEWS score is Yellow or Red  Were vital signs taken at a resting state? Yes  Focused Assessment No change from prior assessment  Does the patient meet 2 or more of the SIRS criteria? Yes  Does the patient have a confirmed or suspected source of infection? Yes  Provider and Rapid Response Notified? Yes  MEWS guidelines implemented  Yes, yellow  Treat  MEWS Interventions Considered administering scheduled or prn medications/treatments as ordered  Take Vital Signs  Increase Vital Sign Frequency  Yellow: Q2hr x1, continue Q4hrs until patient remains green for 12hrs  Escalate  MEWS: Escalate Yellow: Discuss with charge nurse and consider notifying provider and/or RRT  Notify: Charge Nurse/RN  Name of Charge Nurse/RN Notified Christa RN  Provider Notification  Provider Name/Title Adhikari RN  Date Provider Notified 06/27/22  Time Provider Notified [phone]  Method of Notification Page  Notification Reason Other (Comment) (yellow MEWS)  Provider response At bedside;See new orders  Date of Provider Response 06/27/22  Time of Provider Response 0913  Assess: SIRS CRITERIA  SIRS Temperature  0  SIRS Pulse 1  SIRS Respirations  0  SIRS WBC 0  SIRS Score Sum  1   Patient received on RED MEWS. Guidelines protocol continued, MD aware, new orders given. Sponge bath given. Patient is closely monitored.

## 2022-06-27 NOTE — Progress Notes (Signed)
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 Diabetes, hypothyroidism who presented with increasing shortness of breath and confusion.  Also reported runny nose, congestion, cough, fever.  She was recently hospitalized in November last year when she presented with acute hypoxic respiratory failure secondary to pneumonia, radiation pneumonitis and was also found to have PE.  She is on oxygen at 2 L at home.  On presentation, she was hypotensive.  Chest x-ray showed retrocardiac opacity consistent with left lower lobe pneumonia.  Respiratory viral panel came out to be positive with metapneumovirus.  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  Secondary to viral pneumonia.  On 2 L of oxygen at home chronically, at baseline at present.  Left lower lobe pneumonia/metapneumovirus infection: Presented with cough, fever, shortness of breath, congestion.  Respiratory viral panel came at a positive for metapneumovirus.  Chest x-ray also showed retrocardiac opacity consistent with left lower lobe pneumonia.  Also on azithromycin, Rocephin, will continue that.  Febrile this morning.  Continue Tylenol for fever. Will stop IV fluids because she has crackles bilaterally.will check a cxr. She has congestion of nose and cough: Continue Mucinex,  Flonase  History of metastatic small cell carcinoma of lung: Mets to bone, liver, lymph node.  Oncology following.  Recent PET scan on 3/15 showing overall progression of the cancer with new hypermetabolic mediastinal adenopathy, pleural nodularity, osseous lesions, increased size of hepatic lobe mass, left adrenal nodule.  Patient is a status post radiation therapy and was previously on chemoimmunotherapy.  Follows with Dr. Marin Olp who is following here.  History of PE: On Eliquis  Insulin-dependent diabetes type 2: A1c of 10.4 as per 02/26/2022.  Continue current insulin regimen.  Monitor blood sugars  Hypothyroidism: Continue Synthyroid  Hypotension: Blood pressure remains soft but she remains asymptomatic.  Will start on midodrine 5 mg 3 times a day  Hyponatremia: Resolved  Hypokalemia: Currently being supplemented        DVT prophylaxis:apixaban (ELIQUIS) tablet 5 mg Start: 06/26/22 2200 apixaban (ELIQUIS) tablet 5 mg     Code Status: DNR  Family Communication: Called and discussed with husband on phone on 3/23  Patient status:Inpatient  Patient is from :Home  Anticipated discharge NE:[id number]  Estimated DC date:1-2 days   Consultants: Oncology  Procedures:None  Antimicrobials:  Anti-infectives (From admission, onward)    Start     Dose/Rate Route Frequency Ordered Stop   06/26/22 1818  valACYclovir (VALTREX) tablet 1,000 mg  Status:  Discontinued        1,000 mg Oral Daily PRN 06/26/22 1818 06/26/22 1931   06/26/22 1430  cefTRIAXone (ROCEPHIN) 2 g in sodium chloride 0.9 % 100 mL IVPB        2 g 200 mL/hr over 30 Minutes Intravenous Every 24 hours 06/26/22 1419 07/01/22 1429   06/26/22 1430  azithromycin (ZITHROMAX) 500 mg in  sodium chloride 0.9 % 250 mL IVPB        500 mg 250 mL/hr over 60 Minutes Intravenous Every 24 hours 06/26/22 1419 07/01/22 1429       Subjective: Patient seen and examined at bedside today.  She looks comfortable.  She feels better.   She is still having some cough.  When I examined her, she was on room air and she was not complaining of any shortness of breath.  Found to have crackles on auscultation bilaterally but not in any kind of respiratory  distress.  Afebrile when I saw her though she was febrile this morning  Objective: Vitals:   06/27/22 0634 06/27/22 0655 06/27/22 0744 06/27/22 0759  BP: 134/77   107/63  Pulse: (!) 133  (!) 125 (!) 131  Resp: 18  18 16   Temp: 98.7 F (37.1 C) (!) 102.4 F (39.1 C)  100.2 F (37.9 C)  TempSrc: Oral Oral  Oral  SpO2: 95%  96% 95%  Weight:      Height:        Intake/Output Summary (Last 24 hours) at 06/27/2022 [id number] Last data filed at 06/27/2022 0500 Gross per 24 hour  Intake 336.19 ml  Output 350 ml  Net -13.81 ml   Filed Weights   06/26/22 1325  Weight: 55.3 kg    Examination:  General exam: Overall comfortable, not in distress, pleasant female HEENT: PERRL Respiratory system: Crackles bilaterally Cardiovascular system: S1 & S2 heard, RRR.  Port-A-Cath on the right chest Gastrointestinal system: Abdomen is nondistended, soft and nontender. Central nervous system: Alert and oriented Extremities: No edema, no clubbing ,no cyanosis Skin: No rashes, no ulcers,no icterus     Data Reviewed: I have personally reviewed following labs and imaging studies  CBC: Recent Labs  Lab 06/26/22 1328  WBC 8.7  NEUTROABS 7.6  HGB 12.2  HCT 37.0  MCV 94.9  PLT [id number]*   Basic Metabolic Panel: Recent Labs  Lab 06/26/22 1328 06/27/22 0254  NA 129* 136  K 3.5 3.0*  CL 95* 102  CO2 24 26  GLUCOSE 265* 95  BUN 15 13  CREATININE 0.69 0.46  CALCIUM 8.6* 8.0*     Recent Results (from the past 240 hour(s))  Resp panel by RT-PCR (RSV, Flu A&B, Covid) Anterior Nasal Swab     Status: None   Collection Time: 06/26/22  1:35 PM   Specimen: Anterior Nasal Swab  Result Value Ref Range Status   SARS Coronavirus 2 by RT PCR NEGATIVE NEGATIVE Final    Comment:  (NOTE) SARS-CoV-2 target nucleic acids are NOT DETECTED.  The SARS-CoV-2 RNA is generally detectable in upper respiratory specimens during the acute phase of infection. The lowest concentration of SARS-CoV-2 viral copies this assay can detect is 138 copies/mL. A negative result does not preclude SARS-Cov-2 infection and should not be used as the sole basis for treatment or other patient management decisions. A negative result may occur with  improper specimen collection/handling, submission of specimen other than nasopharyngeal swab, presence of viral mutation(s) within the areas targeted by this assay, and inadequate number of viral copies(<138 copies/mL). A negative result must be combined with clinical observations, patient history, and epidemiological information. The expected result is Negative.  Fact Sheet for Patients:  EntrepreneurPulse.com.au  Fact Sheet for Healthcare Providers:  IncredibleEmployment.be  This test is no t yet approved or cleared by the [place] FDA and  has been authorized for detection and/or diagnosis of SARS-CoV-2 by FDA under an  Emergency Use Authorization (EUA). This EUA will remain  in effect (meaning this test can be used) for the duration of the COVID-19 declaration under Section 564(b)(1) of the Act, 21 U.S.C.section 360bbb-3(b)(1), unless the authorization is terminated  or revoked sooner.       Influenza A by PCR NEGATIVE NEGATIVE Final   Influenza B by PCR NEGATIVE NEGATIVE Final    Comment: (NOTE) The Xpert Xpress SARS-CoV-2/FLU/RSV plus assay is intended as an aid in the diagnosis of influenza from Nasopharyngeal swab specimens and should not be used as a sole basis for treatment. Nasal washings and aspirates are unacceptable for Xpert Xpress SARS-CoV-2/FLU/RSV testing.  Fact Sheet for Patients: EntrepreneurPulse.com.au  Fact Sheet for Healthcare  Providers: IncredibleEmployment.be  This test is not yet approved or cleared by the [place] FDA and has been authorized for detection and/or diagnosis of SARS-CoV-2 by FDA under an Emergency Use Authorization (EUA). This EUA will remain in effect (meaning this test can be used) for the duration of the COVID-19 declaration under Section 564(b)(1) of the Act, 21 U.S.C. section 360bbb-3(b)(1), unless the authorization is terminated or revoked.     Resp Syncytial Virus by PCR NEGATIVE NEGATIVE Final    Comment: (NOTE) Fact Sheet for Patients: EntrepreneurPulse.com.au  Fact Sheet for Healthcare Providers: IncredibleEmployment.be  This test is not yet approved or cleared by the [place] FDA and has been authorized for detection and/or diagnosis of SARS-CoV-2 by FDA under an Emergency Use Authorization (EUA). This EUA will remain in effect (meaning this test can be used) for the duration of the COVID-19 declaration under Section 564(b)(1) of the Act, 21 U.S.C. section 360bbb-3(b)(1), unless the authorization is terminated or revoked.  Performed at [facility], [address] [place], [place] [address]   Respiratory (~20 pathogens) panel by PCR     Status: Abnormal   Collection Time: 06/26/22  7:34 PM   Specimen: Nasopharyngeal Swab; Respiratory  Result Value Ref Range Status   Adenovirus NOT DETECTED NOT DETECTED Final   Coronavirus 229E NOT DETECTED NOT DETECTED Final    Comment: (NOTE) The Coronavirus on the Respiratory Panel, DOES NOT test for the novel  Coronavirus (2019 nCoV)    Coronavirus HKU1 NOT DETECTED NOT DETECTED Final   Coronavirus NL63 NOT DETECTED NOT DETECTED Final   Coronavirus OC43 NOT DETECTED NOT DETECTED Final   Metapneumovirus DETECTED (A) NOT DETECTED Final   Rhinovirus / Enterovirus NOT DETECTED NOT DETECTED Final   Influenza A NOT DETECTED NOT DETECTED Final   Influenza B NOT  DETECTED NOT DETECTED Final   Parainfluenza Virus 1 NOT DETECTED NOT DETECTED Final   Parainfluenza Virus 2 NOT DETECTED NOT DETECTED Final   Parainfluenza Virus 3 NOT DETECTED NOT DETECTED Final   Parainfluenza Virus 4 NOT DETECTED NOT DETECTED Final   Respiratory Syncytial Virus NOT DETECTED NOT DETECTED Final   Bordetella pertussis NOT DETECTED NOT DETECTED Final   Bordetella Parapertussis NOT DETECTED NOT DETECTED Final   Chlamydophila pneumoniae NOT DETECTED NOT DETECTED Final   Mycoplasma pneumoniae NOT DETECTED NOT DETECTED Final    Comment: Performed at [facility], [place]. [address], [place], [place] [address]     Radiology Studies: CT Head Wo Contrast  Result Date: 06/26/2022 CLINICAL DATA:  Altered mental status, confusion, history of lung carcinoma EXAM: CT HEAD WITHOUT CONTRAST TECHNIQUE: Contiguous axial images were obtained from the base of the skull through the vertex without intravenous contrast. RADIATION DOSE REDUCTION: This exam was performed  according to the departmental dose-optimization program which includes automated exposure control, adjustment of the mA and/or kV according to patient size and/or use of iterative reconstruction technique. COMPARISON:  Previous studies including CT brain done on 11/30/2017 and MR brain done on 04/22/2022 FINDINGS: Brain: No acute intracranial findings are seen. Ventricles are not dilated. There is no focal edema or mass effect. There are no signs of bleeding within the cranium. Vascular: Unremarkable. Skull: No focal lytic lesions are seen. Sinuses/Orbits: Mucosal thickening is seen in ethmoid and maxillary sinuses. There are fewer than usual mastoid air cells. Other: None. IMPRESSION: No acute intracranial findings are seen. There are no signs of bleeding within the cranium. There is no focal edema or mass effect. Electronically Signed   By: Elmer Picker M.D.   On: 06/26/2022 15:06   DG Chest Port 1 View  Result Date:  06/26/2022 CLINICAL DATA:  Confusion. History of lung cancer. Diarrhea, vomiting, coughing this morning. EXAM: PORTABLE CHEST 1 VIEW COMPARISON:  Chest radiograph 05/05/2022, 03/11/2022; PET-CT 06/19/2022 FINDINGS: Right IJ power injectable port central venous catheter tip projects at the level of the superior cavoatrial junction. Retrocardiac airspace opacity corresponds to the left lower lobe consolidation recent PET-CT 06/19/2022. Right basilar subsegmental atelectasis. No pleural effusion or pneumothorax. The heart size and mediastinal contours are within normal limits. Stable appearance of the anterior right fourth rib. No acute osseous abnormality. Partially visualized cervical spinal fixation. IMPRESSION: 1. Retrocardiac airspace opacity corresponds to the left lower lobe consolidation recent PET-CT 06/19/2022, possibly reflecting pneumonia in the appropriate clinical context versus posttreatment collapse of the medial left lower lobe as described on recent PET-CT report. 2. Right basilar subsegmental atelectasis. Electronically Signed   By: Ileana Roup M.D.   On: 06/26/2022 14:09    Scheduled Meds:  ALPRAZolam  0.5 mg Oral QHS   apixaban  5 mg Oral BID   Chlorhexidine Gluconate Cloth  6 each Topical Daily   FLUoxetine  20 mg Oral Daily   insulin aspart  0-15 Units Subcutaneous TID PC & HS   ipratropium-albuterol  3 mL Nebulization Q6H   levothyroxine  125 mcg Oral Q0600   Continuous Infusions:  sodium chloride 75 mL/hr at 06/26/22 1951   azithromycin Stopped (06/26/22 1630)   cefTRIAXone (ROCEPHIN)  IV Stopped (06/26/22 1525)     LOS: 1 day   Shelly Coss, MD [facility] P3/23/2024, 8:21 AM

## 2022-06-28 DIAGNOSIS — J9601 Acute respiratory failure with hypoxia: Secondary | ICD-10-CM | POA: Diagnosis not present

## 2022-06-28 LAB — CBC WITH DIFFERENTIAL/PLATELET
Abs Immature Granulocytes: 0.05 10*3/uL (ref 0.00–0.07)
Basophils Absolute: 0 10*3/uL (ref 0.0–0.1)
Basophils Relative: 0 %
Eosinophils Absolute: 0 10*3/uL (ref 0.0–0.5)
Eosinophils Relative: 0 %
HCT: 30.2 % — ABNORMAL LOW (ref 36.0–46.0)
Hemoglobin: 9.8 g/dL — ABNORMAL LOW (ref 12.0–15.0)
Immature Granulocytes: 1 %
Lymphocytes Relative: 12 %
Lymphs Abs: 0.9 10*3/uL (ref 0.7–4.0)
MCH: 31.7 pg (ref 26.0–34.0)
MCHC: 32.5 g/dL (ref 30.0–36.0)
MCV: 97.7 fL (ref 80.0–100.0)
Monocytes Absolute: 0.6 10*3/uL (ref 0.1–1.0)
Monocytes Relative: 8 %
Neutro Abs: 6 10*3/uL (ref 1.7–7.7)
Neutrophils Relative %: 79 %
Platelets: 119 10*3/uL — ABNORMAL LOW (ref 150–400)
RBC: 3.09 MIL/uL — ABNORMAL LOW (ref 3.87–5.11)
RDW: 13.1 % (ref 11.5–15.5)
WBC: 7.6 10*3/uL (ref 4.0–10.5)
nRBC: 0 % (ref 0.0–0.2)

## 2022-06-28 LAB — GLUCOSE, CAPILLARY
Glucose-Capillary: 93 mg/dL (ref 70–99)
Glucose-Capillary: 158 mg/dL — ABNORMAL HIGH (ref 70–99)
Glucose-Capillary: 245 mg/dL — ABNORMAL HIGH (ref 70–99)
Glucose-Capillary: 140 mg/dL — ABNORMAL HIGH (ref 70–99)

## 2022-06-28 LAB — COMPREHENSIVE METABOLIC PANEL
ALT: 10 U/L (ref 0–44)
AST: 16 U/L (ref 15–41)
Albumin: 2.8 g/dL — ABNORMAL LOW (ref 3.5–5.0)
Alkaline Phosphatase: 41 U/L (ref 38–126)
Anion gap: 6 (ref 5–15)
BUN: 9 mg/dL (ref 8–23)
CO2: 24 mmol/L (ref 22–32)
Calcium: 8.2 mg/dL — ABNORMAL LOW (ref 8.9–10.3)
Chloride: 105 mmol/L (ref 98–111)
Creatinine, Ser: 0.51 mg/dL (ref 0.44–1.00)
GFR, Estimated: >60 mL/min (ref >60)
Glucose, Bld: 80 mg/dL (ref 70–99)
Potassium: 3.6 mmol/L (ref 3.5–5.1)
Sodium: 135 mmol/L (ref 135–145)
Total Bilirubin: 0.3 mg/dL (ref 0.3–1.2)
Total Protein: 6.3 g/dL — ABNORMAL LOW (ref 6.5–8.1)

## 2022-06-28 NOTE — Progress Notes (Addendum)
Mr. 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 Diabetes, hypothyroidism who presented with increasing shortness of breath and confusion.  Also reported runny nose, congestion, cough, fever.  She was recently hospitalized in November last year when she presented with acute hypoxic respiratory failure secondary to pneumonia, radiation pneumonitis and was also found to have PE.  She is on oxygen at 2 L at home.  On presentation, she was hypotensive.  Chest x-ray showed retrocardiac opacity consistent with left lower lobe pneumonia.  Respiratory viral panel came out to be positive with metapneumovirus.  Currently respiratory status is stable.  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  Secondary to viral pneumonia.  On 2 L of oxygen at home chronically, at baseline at present.  Left lower lobe pneumonia/metapneumovirus infection: Presented with cough, fever, shortness of breath, congestion.  Respiratory viral panel came at a positive for metapneumovirus.  Chest x-ray also showed retrocardiac opacity consistent with left lower lobe pneumonia.  Elevated procalcitonin.  Also on azithromycin, Rocephin, will continue that. Stopped IV fluids because she has crackles bilaterally.will check a cxr. She had  congestion of nose and cough: Continue Mucinex, Flonase. Afebrile this morning.  Blood cultures have not shown any growth. CXR follow up done on 3/23 showed chronic interstitial lung disease with bibasilar scarring and retrocardiac left base collapse/consolidation.  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  Oncology following.  Recent PET scan on 3/15 showing overall progression of the cancer with new hypermetabolic mediastinal adenopathy, pleural nodularity, osseous lesions, increased size of hepatic lobe mass, left adrenal nodule.  Patient is a status post radiation therapy and was previously on chemoimmunotherapy.  Follows with Dr. Marin Olp who is following here.  History of PE: On Eliquis  Insulin-dependent diabetes type 2: A1c of 10.4 as per 02/26/2022.  Continue current insulin regimen.  Monitor blood sugars  Hypothyroidism: Continue Synthyroid  Hypotension: Blood pressure remains soft but she remains asymptomatic.  Started  on midodrine 5 mg 3 times a day.  Sinus tachycardia:Chronic as per husband.  Last echo was done on 02/2022 showed EF of 60 to 123456, grade 1 diastolic dysfunction, no valvular abnormality  Hyponatremia: Resolved  Hypokalemia: Currently being supplemented        DVT prophylaxis:apixaban (ELIQUIS) tablet 5 mg Start: 06/26/22 2200 apixaban (ELIQUIS) tablet 5 mg     Code Status: DNR  Family Communication: Called and discussed with husband on phone on 3/24  Patient status:Inpatient  Patient is from :Home  Anticipated discharge NE:[id number]  Estimated DC date:1-2 days   Consultants: Oncology  Procedures:None  Antimicrobials:  Anti-infectives (From admission, onward)    Start     Dose/Rate Route Frequency Ordered Stop   06/26/22 1818  valACYclovir (VALTREX) tablet 1,000 mg  Status:  Discontinued  1,000 mg Oral Daily PRN 06/26/22 1818 06/26/22 1931   06/26/22 1430  cefTRIAXone (ROCEPHIN) 2 g in sodium  chloride 0.9 % 100 mL IVPB        2 g 200 mL/hr over 30 Minutes Intravenous Every 24 hours 06/26/22 1419 07/01/22 1429   06/26/22 1430  azithromycin (ZITHROMAX) 500 mg in sodium chloride 0.9 % 250 mL IVPB        500 mg 250 mL/hr over 60 Minutes Intravenous Every 24 hours 06/26/22 1419 07/01/22 1429       Subjective: Patient seen and examined at bedside today.  Hemodynamically stable.  When I evaluated her, she was on room air.  She feels better.  She denies any worsening shortness of breath or cough.  We discussed about monitoring her 1 more day with current antibiotic therapy.  Objective: Vitals:   06/27/22 1648 06/27/22 1928 06/27/22 2348 06/28/22 0602  BP: 106/72 133/65 (!) 102/58 133/84  Pulse: (!) 116 (!) 114 99 (!) 109  Resp: 18 18 20 18   Temp: 98.6 F (37 C) 99.7 F (37.6 C) 98.6 F (37 C) 99.6 F (37.6 C)  TempSrc:  Oral Oral Oral  SpO2: 91% 95% 100% 91%  Weight:      Height:        Intake/Output Summary (Last 24 hours) at 06/28/2022 1147 Last data filed at 06/28/2022 0900 Gross per 24 hour  Intake 580 ml  Output 1900 ml  Net -1320 ml   Filed Weights   06/26/22 1325  Weight: 55.3 kg    Examination:  General exam: Overall comfortable, not in distress HEENT: PERRL Respiratory system: Bilateral occasional scattered rhonchi Cardiovascular system: S1 & S2 heard, RRR.  Port-A-Cath on the right chest Gastrointestinal system: Abdomen is nondistended, soft and nontender. Central nervous system: Alert and oriented Extremities: No edema, no clubbing ,no cyanosis Skin: No rashes, no ulcers,no icterus     Data Reviewed: I have personally reviewed following labs and imaging studies  CBC: Recent Labs  Lab 06/26/22 1328 06/27/22 1120 06/28/22 0239  WBC 8.7 8.7 7.6  NEUTROABS 7.6 7.2 6.0  HGB 12.2 10.4* 9.8*  HCT 37.0 31.4* 30.2*  MCV 94.9 96.3 97.7  PLT 148* 116* 123456*   Basic Metabolic Panel: Recent Labs  Lab 06/26/22 1328 06/27/22 0254 06/27/22 1120  06/28/22 0239  NA 129* 136 128* 135  K 3.5 3.0* 3.4* 3.6  CL 95* 102 96* 105  CO2 24 26 24 24   GLUCOSE 265* 95 211* 80  BUN 15 13 10 9   CREATININE 0.69 0.46 0.56 0.51  CALCIUM 8.6* 8.0* 7.7* 8.2*     Recent Results (from the past 240 hour(s))  Culture, blood (Routine x 2)     Status: None (Preliminary result)   Collection Time: 06/26/22  1:30 PM   Specimen: BLOOD  Result Value Ref Range Status   Specimen Description   Final    BLOOD LEFT ANTECUBITAL Performed at [facility], [address] [place], [place] [address]    Special Requests   Final    BOTTLES DRAWN AEROBIC AND ANAEROBIC Blood Culture adequate volume Performed at [facility], [address] [place], [place] [address]    Culture   Final    NO GROWTH 2 DAYS Performed at [facility], [place] [address] [place], [place] [address]    Report Status PENDING  Incomplete  Resp panel by RT-PCR (RSV, Flu A&B, Covid) Anterior Nasal Swab  Status: None   Collection Time: 06/26/22  1:35 PM   Specimen: Anterior Nasal Swab  Result Value Ref Range Status   SARS Coronavirus 2 by RT PCR NEGATIVE NEGATIVE Final    Comment: (NOTE) SARS-CoV-2 target nucleic acids are NOT DETECTED.  The SARS-CoV-2 RNA is generally detectable in upper respiratory specimens during the acute phase of infection. The lowest concentration of SARS-CoV-2 viral copies this assay can detect is 138 copies/mL. A negative result does not preclude SARS-Cov-2 infection and should not be used as the sole basis for treatment or other patient management decisions. A negative result may occur with  improper specimen collection/handling, submission of specimen other than nasopharyngeal swab, presence of viral mutation(s) within the areas targeted by this assay, and inadequate number of viral copies(<138 copies/mL). A negative result must be combined with clinical observations, patient history, and epidemiological information. The  expected result is Negative.  Fact Sheet for Patients:  EntrepreneurPulse.com.au  Fact Sheet for Healthcare Providers:  IncredibleEmployment.be  This test is no t yet approved or cleared by the [place] FDA and  has been authorized for detection and/or diagnosis of SARS-CoV-2 by FDA under an Emergency Use Authorization (EUA). This EUA will remain  in effect (meaning this test can be used) for the duration of the COVID-19 declaration under Section 564(b)(1) of the Act, 21 U.S.C.section 360bbb-3(b)(1), unless the authorization is terminated  or revoked sooner.       Influenza A by PCR NEGATIVE NEGATIVE Final   Influenza B by PCR NEGATIVE NEGATIVE Final    Comment: (NOTE) The Xpert Xpress SARS-CoV-2/FLU/RSV plus assay is intended as an aid in the diagnosis of influenza from Nasopharyngeal swab specimens and should not be used as a sole basis for treatment. Nasal washings and aspirates are unacceptable for Xpert Xpress SARS-CoV-2/FLU/RSV testing.  Fact Sheet for Patients: EntrepreneurPulse.com.au  Fact Sheet for Healthcare Providers: IncredibleEmployment.be  This test is not yet approved or cleared by the [place] FDA and has been authorized for detection and/or diagnosis of SARS-CoV-2 by FDA under an Emergency Use Authorization (EUA). This EUA will remain in effect (meaning this test can be used) for the duration of the COVID-19 declaration under Section 564(b)(1) of the Act, 21 U.S.C. section 360bbb-3(b)(1), unless the authorization is terminated or revoked.     Resp Syncytial Virus by PCR NEGATIVE NEGATIVE Final    Comment: (NOTE) Fact Sheet for Patients: EntrepreneurPulse.com.au  Fact Sheet for Healthcare Providers: IncredibleEmployment.be  This test is not yet approved or cleared by the [place] FDA and has been authorized for detection and/or  diagnosis of SARS-CoV-2 by FDA under an Emergency Use Authorization (EUA). This EUA will remain in effect (meaning this test can be used) for the duration of the COVID-19 declaration under Section 564(b)(1) of the Act, 21 U.S.C. section 360bbb-3(b)(1), unless the authorization is terminated or revoked.  Performed at [facility], [address] [place], [place] [address]   Culture, blood (Routine x 2)     Status: None (Preliminary result)   Collection Time: 06/26/22  1:40 PM   Specimen: BLOOD  Result Value Ref Range Status   Specimen Description   Final    BLOOD LEFT ANTECUBITAL Performed at [facility], [address] [place], [place] [address]    Special Requests   Final    BOTTLES DRAWN AEROBIC AND ANAEROBIC Blood Culture adequate volume Performed at [facility], [address] [place], [place] [address]  Culture   Final    NO GROWTH 2 DAYS Performed at [facility], [place] [address], [place], [place] [address]    Report Status PENDING  Incomplete  Respiratory (~20 pathogens) panel by PCR     Status: Abnormal   Collection Time: 06/26/22  7:34 PM   Specimen: Nasopharyngeal Swab; Respiratory  Result Value Ref Range Status   Adenovirus NOT DETECTED NOT DETECTED Final   Coronavirus 229E NOT DETECTED NOT DETECTED Final    Comment: (NOTE) The Coronavirus on the Respiratory Panel, DOES NOT test for the novel  Coronavirus (2019 nCoV)    Coronavirus HKU1 NOT DETECTED NOT DETECTED Final   Coronavirus NL63 NOT DETECTED NOT DETECTED Final   Coronavirus OC43 NOT DETECTED NOT DETECTED Final   Metapneumovirus DETECTED (A) NOT DETECTED Final   Rhinovirus / Enterovirus NOT DETECTED NOT DETECTED Final   Influenza A NOT DETECTED NOT DETECTED Final   Influenza B NOT DETECTED NOT DETECTED Final   Parainfluenza Virus 1 NOT DETECTED NOT DETECTED Final   Parainfluenza Virus 2 NOT DETECTED NOT DETECTED Final   Parainfluenza Virus 3 NOT DETECTED NOT  DETECTED Final   Parainfluenza Virus 4 NOT DETECTED NOT DETECTED Final   Respiratory Syncytial Virus NOT DETECTED NOT DETECTED Final   Bordetella pertussis NOT DETECTED NOT DETECTED Final   Bordetella Parapertussis NOT DETECTED NOT DETECTED Final   Chlamydophila pneumoniae NOT DETECTED NOT DETECTED Final   Mycoplasma pneumoniae NOT DETECTED NOT DETECTED Final    Comment: Performed at [facility], [place], [place], [place] [address]     Radiology Studies: DG CHEST PORT 1 VIEW  Result Date: 06/27/2022 CLINICAL DATA:  Shortness of breath. EXAM: PORTABLE CHEST 1 VIEW COMPARISON:  06/26/2022 FINDINGS: Cardiopericardial silhouette is at upper limits of normal for size. Parenchymal scarring at the lung bases is similar to prior. Similar appearance of the retrocardiac left base collapse/consolidative opacity. Interstitial markings are diffusely coarsened with chronic features. Nodular fullness in the right hilar region is similar. Right Port-A-Cath again noted. Telemetry leads overlie the chest. IMPRESSION: 1. Chronic interstitial lung disease with bibasilar scarring and similar retrocardiac left base collapse/consolidation. 2. Nodular fullness in the right hilar region is similar to prior. Electronically Signed   By: Misty Stanley M.D.   On: 06/27/2022 13:56   CT Head Wo Contrast  Result Date: 06/26/2022 CLINICAL DATA:  Altered mental status, confusion, history of lung carcinoma EXAM: CT HEAD WITHOUT CONTRAST TECHNIQUE: Contiguous axial images were obtained from the base of the skull through the vertex without intravenous contrast. RADIATION DOSE REDUCTION: This exam was performed according to the departmental dose-optimization program which includes automated exposure control, adjustment of the mA and/or kV according to patient size and/or use of iterative reconstruction technique. COMPARISON:  Previous studies including CT brain done on 11/30/2017 and MR brain done on 04/22/2022 FINDINGS:  Brain: No acute intracranial findings are seen. Ventricles are not dilated. There is no focal edema or mass effect. There are no signs of bleeding within the cranium. Vascular: Unremarkable. Skull: No focal lytic lesions are seen. Sinuses/Orbits: Mucosal thickening is seen in ethmoid and maxillary sinuses. There are fewer than usual mastoid air cells. Other: None. IMPRESSION: No acute intracranial findings are seen. There are no signs of bleeding within the cranium. There is no focal edema or mass effect. Electronically Signed   By: Elmer Picker M.D.   On: 06/26/2022 15:06   DG Chest Port 1 View  Result Date: 06/26/2022 CLINICAL  DATA:  Confusion. History of lung cancer. Diarrhea, vomiting, coughing this morning. EXAM: PORTABLE CHEST 1 VIEW COMPARISON:  Chest radiograph 05/05/2022, 03/11/2022; PET-CT 06/19/2022 FINDINGS: Right IJ power injectable port central venous catheter tip projects at the level of the superior cavoatrial junction. Retrocardiac airspace opacity corresponds to the left lower lobe consolidation recent PET-CT 06/19/2022. Right basilar subsegmental atelectasis. No pleural effusion or pneumothorax. The heart size and mediastinal contours are within normal limits. Stable appearance of the anterior right fourth rib. No acute osseous abnormality. Partially visualized cervical spinal fixation. IMPRESSION: 1. Retrocardiac airspace opacity corresponds to the left lower lobe consolidation recent PET-CT 06/19/2022, possibly reflecting pneumonia in the appropriate clinical context versus posttreatment collapse of the medial left lower lobe as described on recent PET-CT report. 2. Right basilar subsegmental atelectasis. Electronically Signed   By: Ileana Roup M.D.   On: 06/26/2022 14:09    Scheduled Meds:  ALPRAZolam  0.5 mg Oral QHS   apixaban  5 mg Oral BID   Chlorhexidine Gluconate Cloth  6 each Topical Daily   FLUoxetine  20 mg Oral Daily   fluticasone  1 spray Each Nare Daily    guaiFENesin  600 mg Oral BID   insulin aspart  0-15 Units Subcutaneous TID PC & HS   insulin glargine-yfgn  25 Units Subcutaneous Daily   levothyroxine  125 mcg Oral Q0600   midodrine  5 mg Oral TID WC   Continuous Infusions:  azithromycin 500 mg (06/27/22 1527)   cefTRIAXone (ROCEPHIN)  IV 2 g (06/27/22 1415)     LOS: 2 days   Shelly Coss, MD [facility] P3/24/2024, 11:47 AM

## 2022-06-28 NOTE — Evaluation (Signed)
Physical Therapy One Time Evaluation Patient Details Name: [name] MRN: [id number] DOB: [birth year] Today's Date: 06/28/2022  History of Present Illness  72 year old female admitted for acute on chronic hypoxic respiratory failure and left lower lobe pneumonia/metapneumovirus infection.  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 All education has been completed and the patient has no further questions.  Pt very eager to mobilize and tolerated good distance on room air.  SPO2 91-93% on room air during ambulation.  Pt anticipates d/c home with spouse.  Recommend staff continue to mobilize pt during acute admission.  PT is signing off. Thank you for this referral.        Recommendations for follow up therapy are one component of a multi-disciplinary discharge planning process, led by the attending physician.  Recommendations may be updated based on patient status, additional functional criteria and insurance authorization.  Follow Up Recommendations No PT follow up      Assistance Recommended at Discharge    Patient can return home with the following       Equipment Recommendations None recommended by PT  Recommendations for Other Services       Functional Status Assessment Patient has not had a recent decline in their functional status     Precautions / Restrictions Precautions Precaution Comments: monitor sats      Mobility  Bed Mobility Overal bed mobility: Modified Independent                  Transfers Overall transfer level: Needs assistance Equipment used: None Transfers: Sit to/from Stand Sit to Stand: Supervision                Ambulation/Gait Ambulation/Gait assistance: Min guard, Supervision Gait Distance (Feet):  300 Feet Assistive device: IV Pole Gait Pattern/deviations: Step-through pattern, Decreased stride length       General Gait Details: pt pushed IV pole however appears steady, SpO2 91-93% on room air during ambulation, pt denies symptoms  Stairs            Wheelchair Mobility    Modified Rankin (Stroke Patients Only)       Balance Overall balance assessment: No apparent balance deficits (not formally assessed)                                           Pertinent Vitals/Pain Pain Assessment Pain Assessment: No/denies pain    Home Living Family/patient expects to be discharged to:: Private residence Living Arrangements: Spouse/significant other Available Help at Discharge: Family;Available 24 hours/day Type of Home: House Home Access: Stairs to enter   [employer] of Steps: 1   Home Layout: One level Home Equipment: None      Prior Function Prior Level of Function : Independent/Modified Independent                     Hand Dominance        Extremity/Trunk Assessment        Lower Extremity Assessment Lower Extremity Assessment: Overall WFL for tasks assessed    Cervical / Trunk Assessment Cervical / Trunk Assessment: Normal  Communication   Communication: No difficulties  Cognition Arousal/Alertness: Awake/alert Behavior During Therapy: [facility]  for tasks assessed/performed Overall Cognitive Status: Within Functional Limits for tasks assessed                                          General Comments      Exercises     Assessment/Plan    PT Assessment Patient does not need any further PT services  PT Problem List         PT Treatment Interventions      PT Goals (Current goals can be found in the Care Plan section)  Acute Rehab PT Goals PT Goal Formulation: All assessment and education complete, DC therapy    Frequency       Co-evaluation               AM-PAC PT "6 Clicks"  Mobility  Outcome Measure Help needed turning from your back to your side while in a flat bed without using bedrails?: None Help needed moving from lying on your back to sitting on the side of a flat bed without using bedrails?: None Help needed moving to and from a bed to a chair (including a wheelchair)?: None Help needed standing up from a chair using your arms (e.g., wheelchair or bedside chair)?: None Help needed to walk in hospital room?: None Help needed climbing 3-5 steps with a railing? : None 6 Click Score: 24    End of Session   Activity Tolerance: Patient tolerated treatment well Patient left: in chair;with call bell/phone within reach;with chair alarm set Nurse Communication: Mobility status PT Visit Diagnosis: Difficulty in walking, not elsewhere classified (R26.2)    Time: [id number] PT Time Calculation (min) (ACUTE ONLY): 17 min   Charges:   PT Evaluation $PT Eval Low Complexity: 1 Low         Kati PT, DPT Physical Therapist Acute Rehabilitation Services Preferred contact method: Secure Chat Weekend Pager Only: [phone] Office: [place] 06/28/2022, 12:16 PM

## 2022-06-29 ENCOUNTER — Inpatient Hospital Stay: Payer: PPO

## 2022-06-29 ENCOUNTER — Inpatient Hospital Stay: Payer: PPO | Admitting: Hematology & Oncology

## 2022-06-29 ENCOUNTER — Encounter: Payer: Self-pay | Admitting: *Deleted

## 2022-06-29 DIAGNOSIS — J9601 Acute respiratory failure with hypoxia: Secondary | ICD-10-CM | POA: Diagnosis not present

## 2022-06-29 LAB — COMPREHENSIVE METABOLIC PANEL
ALT: 12 U/L (ref 0–44)
AST: 20 U/L (ref 15–41)
Albumin: 2.8 g/dL — ABNORMAL LOW (ref 3.5–5.0)
Alkaline Phosphatase: 43 U/L (ref 38–126)
Anion gap: 8 (ref 5–15)
BUN: 8 mg/dL (ref 8–23)
CO2: 26 mmol/L (ref 22–32)
Calcium: 8.3 mg/dL — ABNORMAL LOW (ref 8.9–10.3)
Chloride: 104 mmol/L (ref 98–111)
Creatinine, Ser: 0.48 mg/dL (ref 0.44–1.00)
GFR, Estimated: >60 mL/min (ref >60)
Glucose, Bld: 63 mg/dL — ABNORMAL LOW (ref 70–99)
Potassium: 3.7 mmol/L (ref 3.5–5.1)
Sodium: 138 mmol/L (ref 135–145)
Total Bilirubin: 0.4 mg/dL (ref 0.3–1.2)
Total Protein: 6.3 g/dL — ABNORMAL LOW (ref 6.5–8.1)

## 2022-06-29 LAB — GLUCOSE, CAPILLARY
Glucose-Capillary: 108 mg/dL — ABNORMAL HIGH (ref 70–99)
Glucose-Capillary: 66 mg/dL — ABNORMAL LOW (ref 70–99)

## 2022-06-29 LAB — CBC WITH DIFFERENTIAL/PLATELET
Abs Immature Granulocytes: 0.03 10*3/uL (ref 0.00–0.07)
Basophils Absolute: 0 10*3/uL (ref 0.0–0.1)
Basophils Relative: 0 %
Eosinophils Absolute: 0.1 10*3/uL (ref 0.0–0.5)
Eosinophils Relative: 2 %
HCT: 30.8 % — ABNORMAL LOW (ref 36.0–46.0)
Hemoglobin: 10 g/dL — ABNORMAL LOW (ref 12.0–15.0)
Immature Granulocytes: 1 %
Lymphocytes Relative: 23 %
Lymphs Abs: 1.1 10*3/uL (ref 0.7–4.0)
MCH: 31.6 pg (ref 26.0–34.0)
MCHC: 32.5 g/dL (ref 30.0–36.0)
MCV: 97.5 fL (ref 80.0–100.0)
Monocytes Absolute: 0.5 10*3/uL (ref 0.1–1.0)
Monocytes Relative: 10 %
Neutro Abs: 3.1 10*3/uL (ref 1.7–7.7)
Neutrophils Relative %: 64 %
Platelets: 119 10*3/uL — ABNORMAL LOW (ref 150–400)
RBC: 3.16 MIL/uL — ABNORMAL LOW (ref 3.87–5.11)
RDW: 13 % (ref 11.5–15.5)
WBC: 4.8 10*3/uL (ref 4.0–10.5)
nRBC: 0 % (ref 0.0–0.2)

## 2022-06-29 MED ORDER — ZOLEDRONIC ACID 4 MG/5ML IV CONC: 4.0000 mg | Freq: Once | INTRAVENOUS | Status: DC

## 2022-06-29 MED ORDER — CEFDINIR 300 MG PO CAPS: 300.0000 mg | ORAL_CAPSULE | Freq: Two times a day (BID) | ORAL | 0 refills | Status: AC

## 2022-06-29 MED ORDER — GUAIFENESIN ER 600 MG PO TB12: 600.0000 mg | ORAL_TABLET | Freq: Two times a day (BID) | ORAL | 0 refills | Status: AC

## 2022-06-29 MED ORDER — ZOLEDRONIC ACID 4 MG/100ML IV SOLN
4.0000 mg | Freq: Once | INTRAVENOUS | Status: AC
  Administered 2022-06-29: 4 mg via INTRAVENOUS
  Filled 2022-06-29: qty 100

## 2022-06-29 MED ORDER — FLUTICASONE PROPIONATE 50 MCG/ACT NA SUSP: 1.0000 | Freq: Every day | NASAL | 1 refills | Status: DC

## 2022-06-29 MED ORDER — AZITHROMYCIN 500 MG PO TABS: 500.0000 mg | ORAL_TABLET | Freq: Every day | ORAL | 0 refills | Status: AC

## 2022-06-29 MED ORDER — ZOLEDRONIC ACID 4 MG/5ML IV CONC
4.0000 mg | Freq: Once | INTRAVENOUS | Status: DC
  Filled 2022-06-29: qty 5

## 2022-06-29 MED ORDER — MIDODRINE HCL 5 MG PO TABS: 5.0000 mg | ORAL_TABLET | Freq: Three times a day (TID) | ORAL | 0 refills | Status: DC

## 2022-06-29 MED ORDER — HEPARIN SOD (PORK) LOCK FLUSH 100 UNIT/ML IV SOLN
500.0000 [IU] | INTRAVENOUS | Status: AC | PRN
  Administered 2022-06-29: 500 [IU]
  Filled 2022-06-29: qty 5

## 2022-06-29 NOTE — Progress Notes (Signed)
Mobility Specialist - Progress Note   06/29/22 1013  Mobility  Activity Ambulated with assistance in hallway  Level of Assistance Standby assist, set-up cues, supervision of patient - no hands on  Assistive Device None  Distance Ambulated (ft) 200 ft  Activity Response Tolerated well  Mobility Referral Yes  $Mobility charge 1 Mobility   Pt received in bed and agreed to mobility. Had no issues throughout session. Pt returned to chair with all needs met.   Roderick Pee Mobility Specialist

## 2022-06-29 NOTE — Consult Note (Signed)
   [facility] CM Inpatient Consult   06/29/2022  MARDELLA RIEDER 1950/08/27 [id number]  [facility] [ACO] Patient:  [name] Hospital Liaison remote coverage review for patient admitted to [facility]    Primary Care Provider:  Carollee Herter, Alferd Apa, DO with [place] at [facility] and this provider office is listed to do the transition of care follow up.   Patient screened for hospitalization with noted high risk score for unplanned readmission risk to assess for potential [facility] service needs for post hospital transition for care coordination.  Review of patient's electronic medical record reveals patient has been outreach by a [place] Coordinator team member in the past 30 days and declines services.   Plan:  Patient is active with the Oncologist Navigator. Referral request for community care coordination: transitioned home with no additional needs assessed for care coordination  Of note, [place] does not replace or interfere with any arrangements made by the Inpatient Transition of Care team.  For questions contact:   Natividad Brood, RN BSN [place]  [phone] business mobile phone Toll free office [phone]  *[place]  [phone] Fax number: [phone] [email or web address]

## 2022-06-29 NOTE — Discharge Summary (Signed)
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 Diabetes, hypothyroidism who presented with increasing shortness of breath and confusion.  Also reported runny nose, congestion, cough, fever.  She was recently hospitalized in November last year when she presented with acute hypoxic respiratory failure secondary to pneumonia, radiation pneumonitis and was also found to have PE.  She is on oxygen at 2 L at home.  On presentation, she was hypotensive.  Chest x-ray showed retrocardiac opacity consistent with left lower lobe pneumonia.  Respiratory viral panel came out to be positive with metapneumovirus.  Currently respiratory status is stable.  Remains on room air for last 1 to 2 days.  Medically stable for discharge home today.   Following problems were addressed during the hospitalization:  Acute on chronic hypoxic respiratory failure: Presented with shortness of breath, cough.  Secondary to viral pneumonia.  On 2 L of oxygen at home chronically, at baseline at present.   Left lower lobe pneumonia/metapneumovirus infection: Presented with cough, fever, shortness of breath, congestion.  Respiratory viral panel came at a positive for metapneumovirus.  Chest x-ray also showed retrocardiac opacity consistent with left lower lobe pneumonia.  Elevated procalcitonin.  She had congestion of nose and cough: Continue Mucinex, Flonase. Antibiotics changed to oral. Afebrile this morning.  Blood cultures have not shown any growth. CXR follow up done on 3/23 showed chronic  interstitial lung disease with bibasilar scarring and retrocardiac left base collapse/consolidation.   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  Oncology following.  Recent PET scan on 3/15 showing overall progression of the cancer with new hypermetabolic mediastinal adenopathy, pleural nodularity, osseous lesions, increased size of hepatic lobe mass, left adrenal nodule.  Patient is a status post radiation therapy and was previously on chemoimmunotherapy.  Follows with Dr. Marin Olp who was following here.  Dr. Marin Olp planning to make her follow-up with radiation oncology.   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  Continue current insulin regimen.  Monitor blood sugars at home   Hypothyroidism: Continue Synthyroid   Hypotension: Blood pressure remains soft but she remains asymptomatic.  Started  on midodrine 5 mg 3 times a day.   Sinus tachycardia:Chronic as per husband.  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   Increase activity slowly   Complete by: As directed       Allergies as of 06/29/2022        Reactions   Hydrocodone-acetaminophen Other (See Comments)   Nightmares, skin crawling   Lyrica [pregabalin] Other (See Comments)   Hallucinations    Crestor [rosuvastatin Calcium] Other (See Comments)   Leg cramps        Medication List     STOP taking these medications    dexamethasone 4 MG tablet Commonly known as: DECADRON   meclizine 12.5 MG tablet Commonly known as: ANTIVERT   methylPREDNISolone 4 MG Tbpk tablet Commonly known as: MEDROL DOSEPAK   tiZANidine 4 MG tablet Commonly known as: Zanaflex   Xtampza ER 13.5 MG C12a Generic drug: oxyCODONE ER       TAKE these medications    acetaminophen 325 MG tablet Commonly known as: TYLENOL Take 1 tablet (325 mg total) by mouth every 6 (six) hours as needed for moderate pain.   albuterol 108 (90 Base) MCG/ACT inhaler Commonly known as: VENTOLIN HFA Inhale 2 puffs into the lungs every 6 (six) hours as needed for wheezing or shortness of breath.   ALPRAZolam 0.5 MG tablet Commonly known as: XANAX Take 1 tablet (0.5 mg total) by mouth 3 (three) times daily as needed.   azithromycin 500 MG tablet Commonly known as: Zithromax Take 1 tablet (500 mg total) by mouth daily for 2 days. Take 1 tablet daily for 3 days.   blood glucose meter kit and supplies Kit Use daily   cefdinir 300 MG capsule Commonly known as: OMNICEF Take 1 capsule (300 mg total) by mouth 2 (two) times daily for 4 days.   Eliquis 5 MG Tabs tablet Generic drug: apixaban Take 1 tablet (5 mg total) by mouth 2 (two) times daily.   FLUoxetine 20 MG capsule Commonly known as: PROZAC Take 1 capsule (20 mg total) by mouth daily.   fluticasone 50 MCG/ACT nasal spray Commonly known as: FLONASE Place 1 spray into both nostrils daily.   guaiFENesin 600 MG 12 hr tablet Commonly known as: MUCINEX Take 1 tablet (600 mg total) by mouth 2 (two) times daily for 7 days.   Insulin Pen Needle 32G X 5 MM Misc Commonly known as: NovoTwist Use daily at  Bedtime with Lantus   ipratropium-albuterol 0.5-2.5 (3) MG/3ML Soln Commonly known as: DUONEB Take 3 mLs by nebulization every 6 (six) hours as needed.   Lantus SoloStar 100 UNIT/ML Solostar Pen Generic drug: insulin glargine Inject 30 Units into the skin daily. HS What changed: additional instructions   levothyroxine 125 MCG tablet Commonly known as: Synthroid Take 1 tablet (125 mcg total) by mouth daily before breakfast.   lidocaine-prilocaine cream Commonly known as: EMLA Apply 1 Application topically as needed.   meloxicam 15 MG tablet Commonly known as: MOBIC TAKE 1 TABLET BY MOUTH DAILY   metFORMIN 500 MG 24 hr tablet Commonly known as: GLUCOPHAGE-XR Take 1 tablet (500 mg total) by mouth in the morning and at bedtime.   midodrine 5 MG tablet Commonly known as: PROAMATINE Take 1 tablet (5 mg total) by mouth 3 (three) times daily with meals.   omeprazole 20 MG capsule Commonly known as: PRILOSEC Take 1 capsule (20 mg total) by mouth daily. What changed: when to take this   ONE TOUCH ULTRA 2 w/Device Kit Check blood sugar as directed. Dx:E11.65 What changed: Another medication with the same name  was changed. Make sure you understand how and when to take each.   OneTouch Verio Flex System w/Device Kit USE AS DIRECTED TO CHECK BLOOD SUGAR FOUR TIMES A DAY What changed: See the new instructions.   OneTouch Verio test strip Generic drug: glucose blood USE ONE STRIP TO TEST DAILY What changed: See the new instructions.   oxyCODONE-acetaminophen 7.5-325 MG tablet Commonly known as: Percocet Take 1 tablet by mouth every 6 (six) hours as needed for severe pain.   Trulicity [id number] [id number] Sopn Generic drug: Dulaglutide Inject 0.75 mg into the skin every Saturday.   valACYclovir 1000 MG tablet Commonly known as: VALTREX TAKE ONE TABLET BY MOUTH DAILY AS NEEDED What changed: reasons to take this        Follow-up Information     [name], DO.  Schedule an appointment as soon as possible for a visit in 1 week(s).   Specialty: Family Medicine Contact information: [phone] [place] [address] [phone]                Allergies  Allergen Reactions   Hydrocodone-Acetaminophen Other (See Comments)    Nightmares, skin crawling   Lyrica [Pregabalin] Other (See Comments)    Hallucinations    Crestor [Rosuvastatin Calcium] Other (See Comments)    Leg cramps    Consultations: None   Procedures/Studies: DG CHEST PORT 1 VIEW  Result Date: 06/27/2022 CLINICAL DATA:  Shortness of breath. EXAM: PORTABLE CHEST 1 VIEW COMPARISON:  06/26/2022 FINDINGS: Cardiopericardial silhouette is at upper limits of normal for size. Parenchymal scarring at the lung bases is similar to prior. Similar appearance of the retrocardiac left base collapse/consolidative opacity. Interstitial markings are diffusely coarsened with chronic features. Nodular fullness in the right hilar region is similar. Right Port-A-Cath again noted. Telemetry leads overlie the chest. IMPRESSION: 1. Chronic interstitial lung disease with bibasilar scarring and similar retrocardiac left base collapse/consolidation. 2. Nodular fullness in the right hilar region is similar to prior. Electronically Signed   By: Misty Stanley M.D.   On: 06/27/2022 13:56   CT Head Wo Contrast  Result Date: 06/26/2022 CLINICAL DATA:  Altered mental status, confusion, history of lung carcinoma EXAM: CT HEAD WITHOUT CONTRAST TECHNIQUE: Contiguous axial images were obtained from the base of the skull through the vertex without intravenous contrast. RADIATION DOSE REDUCTION: This exam was performed according to the departmental dose-optimization program which includes automated exposure control, adjustment of the mA and/or kV according to patient size and/or use of iterative reconstruction technique. COMPARISON:  Previous studies including CT brain done on 11/30/2017 and MR brain done on  04/22/2022 FINDINGS: Brain: No acute intracranial findings are seen. Ventricles are not dilated. There is no focal edema or mass effect. There are no signs of bleeding within the cranium. Vascular: Unremarkable. Skull: No focal lytic lesions are seen. Sinuses/Orbits: Mucosal thickening is seen in ethmoid and maxillary sinuses. There are fewer than usual mastoid air cells. Other: None. IMPRESSION: No acute intracranial findings are seen. There are no signs of bleeding within the cranium. There is no focal edema or mass effect. Electronically Signed   By: Elmer Picker M.D.   On: 06/26/2022 15:06   DG Chest Port 1 View  Result Date: 06/26/2022 CLINICAL DATA:  Confusion. History of lung cancer. Diarrhea, vomiting, coughing this morning. EXAM: PORTABLE CHEST 1 VIEW COMPARISON:  Chest radiograph 05/05/2022, 03/11/2022; PET-CT 06/19/2022 FINDINGS: Right IJ power injectable port central venous catheter tip projects at the level of the superior  cavoatrial junction. Retrocardiac airspace opacity corresponds to the left lower lobe consolidation recent PET-CT 06/19/2022. Right basilar subsegmental atelectasis. No pleural effusion or pneumothorax. The heart size and mediastinal contours are within normal limits. Stable appearance of the anterior right fourth rib. No acute osseous abnormality. Partially visualized cervical spinal fixation. IMPRESSION: 1. Retrocardiac airspace opacity corresponds to the left lower lobe consolidation recent PET-CT 06/19/2022, possibly reflecting pneumonia in the appropriate clinical context versus posttreatment collapse of the medial left lower lobe as described on recent PET-CT report. 2. Right basilar subsegmental atelectasis. Electronically Signed   By: Ileana Roup M.D.   On: 06/26/2022 14:09   NM PET Image Restag (PS) Skull Base To Thigh  Result Date: 06/22/2022 CLINICAL DATA:  Subsequent treatment strategy for small cell lung cancer, assess treatment response. EXAM: NUCLEAR  MEDICINE PET SKULL BASE TO THIGH TECHNIQUE: 5.6 mCi F-18 FDG was injected intravenously. Full-ring PET imaging was performed from the skull base to thigh after the radiotracer. CT data was obtained and used for attenuation correction and anatomic localization. Fasting blood glucose: 191 mg/dl COMPARISON:  04/02/2022. FINDINGS: Mediastinal blood pool activity: SUV max 2.5 Liver activity: SUV max NA NECK: No abnormal hypermetabolism. Incidental CT findings: None. CHEST: Hypermetabolic mediastinal adenopathy has progressed with a new prevascular lymph node measuring 9 mm (4/60), SUV max 19.0. No hypermetabolic hilar or axillary adenopathy. Overall decreased extent of hypermetabolism associated with post treatment collapse/consolidation in the medial left lower lobe, SUV max 6.8. New hypermetabolic subpleural nodule in the medial left lower lobe is seen below the level of radiation scarring, measuring 8 mm (7 x 9 mm, 7/47), SUV max 16.3. Pleural thickening and nodularity in the posterior inferior left hemithorax measures up to 1.7 x 2.3 cm (4/96), SUV max 30.8. Incidental CT findings: Right IJ Port-A-Cath terminates in the right atrium. Small pericardial effusion. There may be trace left pleural fluid. ABDOMEN/PELVIS: Centrally necrotic mass in the dome of the right hepatic lobe measures 4.2 x 5.3 cm, enlarged from 2.9 cm on 04/02/2022, SUV max 6.8, previously 8.8. 1.9 x 2.4 cm left adrenal nodule, increased in size from 1.5 x 1.8 cm, SUV max 10.7, compared to 72.5 previously. Gastrohepatic ligament lymph node measures 10 mm, enlarged from 5 mm, SUV max 14.5. Decreased hypermetabolism associated with the pancreatic head region, SUV max 4.2, previously 11.8. Again, no definite CT correlate is identified. Incidental CT findings: Liver is otherwise grossly unremarkable. Cholecystectomy. Adrenal glands are otherwise unremarkable. Low-attenuation lesion in the upper pole right kidney. No specific follow-up necessary.  Kidneys, spleen, pancreas, stomach and bowel are grossly unremarkable. SKELETON: New and increasingly hypermetabolic lesions throughout the visualized osseous structures. New hypermetabolism in the coracoid process of the left scapula, SUV max 11.8. Incidental CT findings: Postoperative changes in the lumbar spine. Lytic lesions are scattered in the spine. IMPRESSION: 1. Overall progression in extensive stage squamous cell carcinoma with new hypermetabolic mediastinal adenopathy, progressive hypermetabolic pleural nodularity in the left hemithorax, enlarging hypermetabolic gastrohepatic ligament lymph node and new/increasing the hypermetabolic osseous lesions. Right hepatic lobe mass and left adrenal nodule have increased in size but decreased in hypermetabolism. 2. Small pericardial effusion. Electronically Signed   By: Lorin Picket M.D.   On: 06/22/2022 14:59   MR Lumbar Spine W Wo Contrast  Result Date: 06/04/2022 CLINICAL DATA:  Low back pain, cancer suspected. Evaluate for lumbar spine metastasis. EXAM: MRI LUMBAR SPINE WITHOUT AND WITH CONTRAST TECHNIQUE: Multiplanar and multiecho pulse sequences of the lumbar spine were obtained without and  with intravenous contrast. CONTRAST:  69mL GADAVIST GADOBUTROL 1 MMOL/ML IV SOLN COMPARISON:  PET-CT 04/02/2022.  Lumbar spine MRI 05/02/2021. FINDINGS: Segmentation: Conventional numbering is assumed with 5 non-rib-bearing, lumbar type vertebral bodies. Alignment:  Normal. Vertebrae: Unchanged postoperative appearance from prior L4-S1 interbody and posterior spinal fusion. Unchanged small fluid collection in the laminectomy bed, likely seroma. New enhancing lesions in the left pedicle of L1 (axial image 7 series 9/11), in the left transverse process of L2 with surrounding soft tissue component (axial image 13 series 11) and in the junction of the left pedicle and transverse process of L3 (axial image 18 series 9/11) partially imaged marrow replacement with  heterogeneous enhancement in the left iliac crest and right sacral ala (axial image 36 series 9/11). No epidural extension of tumor. Conus medullaris and cauda equina: Conus extends to the L2-3 level. Conus and cauda equina appear normal. Paraspinal and other soft tissues: Unchanged 2.7 cm left adrenal mass, again suspicious for metastasis. Disc levels: Small disc bulges at T12-L1 and L1-2. Mild adjacent segment degeneration with facet arthropathy at L2-3 resulting in mild spinal canal stenosis. IMPRESSION: 1. New enhancing lesions in the left pedicle of L1, in the left transverse process of L2 with surrounding soft tissue component, and in the junction of the left pedicle and transverse process of L3, concerning for metastatic disease. 2. Partially imaged marrow replacement with heterogeneous enhancement in the left iliac crest and right sacral ala, concerning for metastatic disease. 3. Unchanged 2.7 cm left adrenal mass, again suspicious for metastasis. 4. Mild adjacent segment degeneration with mild spinal canal stenosis at L2-3. Electronically Signed   By: Emmit Alexanders M.D.   On: 06/04/2022 13:56      Subjective: Patient seen and examined at bedside today.  Hemodynamically stable.  On room air.  Cough and congestion improving.  She wants to go home today.  Medically stable for dc today  Discharge Exam: Vitals:   06/28/22 1548 06/28/22 1959  BP:  120/84  Pulse:  79  Resp: (!) 28 18  Temp:  97.8 F (36.6 C)  SpO2:  92%   Vitals:   06/28/22 0602 06/28/22 1240 06/28/22 1548 06/28/22 1959  BP: 133/84 108/70  120/84  Pulse: (!) 109 86  79  Resp: 18 16 (!) 28 18  Temp: 99.6 F (37.6 C) 98.5 F (36.9 C)  97.8 F (36.6 C)  TempSrc: Oral Oral  Oral  SpO2: 91% 92%  92%  Weight:      Height:        General: Pt is alert, awake, not in acute distress Cardiovascular: RRR, S1/S2 +, no rubs, no gallops Respiratory: Mild bilateral scattered rhonchi/crackles which might be chronic Abdominal:  Soft, NT, ND, bowel sounds + Extremities: no edema, no cyanosis    The results of significant diagnostics from this hospitalization (including imaging, microbiology, ancillary and laboratory) are listed below for reference.     Microbiology: Recent Results (from the past 240 hour(s))  Culture, blood (Routine x 2)     Status: None (Preliminary result)   Collection Time: 06/26/22  1:30 PM   Specimen: BLOOD  Result Value Ref Range Status   Specimen Description   Final    BLOOD LEFT ANTECUBITAL Performed at [facility], [address] [place], [place] [address]    Special Requests   Final    BOTTLES DRAWN AEROBIC AND ANAEROBIC Blood Culture adequate volume Performed at [facility], [address] [place],  [place] [address]    Culture   Final    NO GROWTH 3 DAYS Performed at [facility], [place] [address], [place], [place] [address]    Report Status PENDING  Incomplete  Resp panel by RT-PCR (RSV, Flu A&B, Covid) Anterior Nasal Swab     Status: None   Collection Time: 06/26/22  1:35 PM   Specimen: Anterior Nasal Swab  Result Value Ref Range Status   SARS Coronavirus 2 by RT PCR NEGATIVE NEGATIVE Final    Comment: (NOTE) SARS-CoV-2 target nucleic acids are NOT DETECTED.  The SARS-CoV-2 RNA is generally detectable in upper respiratory specimens during the acute phase of infection. The lowest concentration of SARS-CoV-2 viral copies this assay can detect is 138 copies/mL. A negative result does not preclude SARS-Cov-2 infection and should not be used as the sole basis for treatment or other patient management decisions. A negative result may occur with  improper specimen collection/handling, submission of specimen other than nasopharyngeal swab, presence of viral mutation(s) within the areas targeted by this assay, and inadequate number of viral copies(<138 copies/mL). A negative result must be combined with clinical observations, patient history, and  epidemiological information. The expected result is Negative.  Fact Sheet for Patients:  EntrepreneurPulse.com.au  Fact Sheet for Healthcare Providers:  IncredibleEmployment.be  This test is no t yet approved or cleared by the [place] FDA and  has been authorized for detection and/or diagnosis of SARS-CoV-2 by FDA under an Emergency Use Authorization (EUA). This EUA will remain  in effect (meaning this test can be used) for the duration of the COVID-19 declaration under Section 564(b)(1) of the Act, 21 U.S.C.section 360bbb-3(b)(1), unless the authorization is terminated  or revoked sooner.       Influenza A by PCR NEGATIVE NEGATIVE Final   Influenza B by PCR NEGATIVE NEGATIVE Final    Comment: (NOTE) The Xpert Xpress SARS-CoV-2/FLU/RSV plus assay is intended as an aid in the diagnosis of influenza from Nasopharyngeal swab specimens and should not be used as a sole basis for treatment. Nasal washings and aspirates are unacceptable for Xpert Xpress SARS-CoV-2/FLU/RSV testing.  Fact Sheet for Patients: EntrepreneurPulse.com.au  Fact Sheet for Healthcare Providers: IncredibleEmployment.be  This test is not yet approved or cleared by the [place] FDA and has been authorized for detection and/or diagnosis of SARS-CoV-2 by FDA under an Emergency Use Authorization (EUA). This EUA will remain in effect (meaning this test can be used) for the duration of the COVID-19 declaration under Section 564(b)(1) of the Act, 21 U.S.C. section 360bbb-3(b)(1), unless the authorization is terminated or revoked.     Resp Syncytial Virus by PCR NEGATIVE NEGATIVE Final    Comment: (NOTE) Fact Sheet for Patients: EntrepreneurPulse.com.au  Fact Sheet for Healthcare Providers: IncredibleEmployment.be  This test is not yet approved or cleared by the [place] FDA and has been  authorized for detection and/or diagnosis of SARS-CoV-2 by FDA under an Emergency Use Authorization (EUA). This EUA will remain in effect (meaning this test can be used) for the duration of the COVID-19 declaration under Section 564(b)(1) of the Act, 21 U.S.C. section 360bbb-3(b)(1), unless the authorization is terminated or revoked.  Performed at [facility], [address] [place], [place] [address]   Culture, blood (Routine x 2)     Status: None (Preliminary result)   Collection Time: 06/26/22  1:40 PM   Specimen: BLOOD  Result Value Ref Range Status   Specimen Description   Final  BLOOD LEFT ANTECUBITAL Performed at [facility], [address] [place], [place] [address]    Special Requests   Final    BOTTLES DRAWN AEROBIC AND ANAEROBIC Blood Culture adequate volume Performed at [facility], [address] [place], [place] [address]    Culture   Final    NO GROWTH 3 DAYS Performed at [facility], [place] [address], [place], [place] [address]    Report Status PENDING  Incomplete  Respiratory (~20 pathogens) panel by PCR     Status: Abnormal   Collection Time: 06/26/22  7:34 PM   Specimen: Nasopharyngeal Swab; Respiratory  Result Value Ref Range Status   Adenovirus NOT DETECTED NOT DETECTED Final   Coronavirus 229E NOT DETECTED NOT DETECTED Final    Comment: (NOTE) The Coronavirus on the Respiratory Panel, DOES NOT test for the novel  Coronavirus (2019 nCoV)    Coronavirus HKU1 NOT DETECTED NOT DETECTED Final   Coronavirus NL63 NOT DETECTED NOT DETECTED Final   Coronavirus OC43 NOT DETECTED NOT DETECTED Final   Metapneumovirus DETECTED (A) NOT DETECTED Final   Rhinovirus / Enterovirus NOT DETECTED NOT DETECTED Final   Influenza A NOT DETECTED NOT DETECTED Final   Influenza B NOT DETECTED NOT DETECTED Final   Parainfluenza Virus 1 NOT DETECTED NOT DETECTED Final   Parainfluenza Virus 2 NOT DETECTED NOT DETECTED Final    Parainfluenza Virus 3 NOT DETECTED NOT DETECTED Final   Parainfluenza Virus 4 NOT DETECTED NOT DETECTED Final   Respiratory Syncytial Virus NOT DETECTED NOT DETECTED Final   Bordetella pertussis NOT DETECTED NOT DETECTED Final   Bordetella Parapertussis NOT DETECTED NOT DETECTED Final   Chlamydophila pneumoniae NOT DETECTED NOT DETECTED Final   Mycoplasma pneumoniae NOT DETECTED NOT DETECTED Final    Comment: Performed at [facility], [place]. [address] [place], [place] [address]     Labs: BNP (last 3 results) Recent Labs    02/28/22 0814 03/01/22 0500 06/27/22 1120  BNP 101.9* 35.1 0000000*   Basic Metabolic Panel: Recent Labs  Lab 06/26/22 1328 06/27/22 0254 06/27/22 1120 06/28/22 0239 06/29/22 0242  NA 129* 136 128* 135 138  K 3.5 3.0* 3.4* 3.6 3.7  CL 95* 102 96* 105 104  CO2 24 26 24 24 26   GLUCOSE 265* 95 211* 80 63*  BUN 15 13 10 9 8   CREATININE 0.69 0.46 0.56 0.51 0.48  CALCIUM 8.6* 8.0* 7.7* 8.2* 8.3*   Liver Function Tests: Recent Labs  Lab 06/26/22 1328 06/27/22 1120 06/28/22 0239 06/29/22 0242  AST 24 20 16 20   ALT 13 12 10 12   ALKPHOS 66 43 41 43  BILITOT 0.6 0.6 0.3 0.4  PROT 7.9 6.4* 6.3* 6.3*  ALBUMIN 3.8 2.9* 2.8* 2.8*   No results for input(s): "LIPASE", "AMYLASE" in the last 168 hours. No results for input(s): "AMMONIA" in the last 168 hours. CBC: Recent Labs  Lab 06/26/22 1328 06/27/22 1120 06/28/22 0239 06/29/22 0242  WBC 8.7 8.7 7.6 4.8  NEUTROABS 7.6 7.2 6.0 3.1  HGB 12.2 10.4* 9.8* 10.0*  HCT 37.0 31.4* 30.2* 30.8*  MCV 94.9 96.3 97.7 97.5  PLT 148* 116* 119* 119*   Cardiac Enzymes: No results for input(s): "CKTOTAL", "CKMB", "CKMBINDEX", "TROPONINI" in the last 168 hours. BNP: Invalid input(s): "POCBNP" CBG: Recent Labs  Lab 06/28/22 1237 06/28/22 1640 06/28/22 2157 06/29/22 0648 06/29/22 0714  GLUCAP 158* 245* 140* 66* 108*   D-Dimer No results for input(s): "  DDIMER" in the last 72 hours. Hgb A1c No results  for input(s): "HGBA1C" in the last 72 hours. Lipid Profile No results for input(s): "CHOL", "HDL", "LDLCALC", "TRIG", "CHOLHDL", "LDLDIRECT" in the last 72 hours. Thyroid function studies Recent Labs    06/27/22 1810  TSH 3.339   Anemia work up No results for input(s): "VITAMINB12", "FOLATE", "FERRITIN", "TIBC", "IRON", "RETICCTPCT" in the last 72 hours. Urinalysis    Component Value Date/Time   COLORURINE YELLOW 06/26/2022 0949   APPEARANCEUR HAZY (A) 06/26/2022 0949   LABSPEC 1.021 06/26/2022 0949   PHURINE 5.0 06/26/2022 0949   GLUCOSEU 150 (A) 06/26/2022 0949   HGBUR SMALL (A) 06/26/2022 0949   HGBUR negative 06/04/2010 0817   BILIRUBINUR NEGATIVE 06/26/2022 0949   BILIRUBINUR negative 11/18/2020 1601   KETONESUR 20 (A) 06/26/2022 0949   PROTEINUR 30 (A) 06/26/2022 0949   UROBILINOGEN 0.2 11/18/2020 1601   UROBILINOGEN negative 06/04/2010 0817   NITRITE NEGATIVE 06/26/2022 0949   LEUKOCYTESUR TRACE (A) 06/26/2022 0949   Sepsis Labs Recent Labs  Lab 06/26/22 1328 06/27/22 1120 06/28/22 0239 06/29/22 0242  WBC 8.7 8.7 7.6 4.8   Microbiology Recent Results (from the past 240 hour(s))  Culture, blood (Routine x 2)     Status: None (Preliminary result)   Collection Time: 06/26/22  1:30 PM   Specimen: BLOOD  Result Value Ref Range Status   Specimen Description   Final    BLOOD LEFT ANTECUBITAL Performed at [facility], [address] [place], [place] [address]    Special Requests   Final    BOTTLES DRAWN AEROBIC AND ANAEROBIC Blood Culture adequate volume Performed at [facility], [address] [place], [place] [address]    Culture   Final    NO GROWTH 3 DAYS Performed at [facility], [place] [address], [place], [place] [address]    Report Status PENDING  Incomplete  Resp panel by RT-PCR (RSV, Flu A&B, Covid) Anterior Nasal Swab     Status: None   Collection Time: 06/26/22  1:35 PM   Specimen: Anterior Nasal Swab  Result Value  Ref Range Status   SARS Coronavirus 2 by RT PCR NEGATIVE NEGATIVE Final    Comment: (NOTE) SARS-CoV-2 target nucleic acids are NOT DETECTED.  The SARS-CoV-2 RNA is generally detectable in upper respiratory specimens during the acute phase of infection. The lowest concentration of SARS-CoV-2 viral copies this assay can detect is 138 copies/mL. A negative result does not preclude SARS-Cov-2 infection and should not be used as the sole basis for treatment or other patient management decisions. A negative result may occur with  improper specimen collection/handling, submission of specimen other than nasopharyngeal swab, presence of viral mutation(s) within the areas targeted by this assay, and inadequate number of viral copies(<138 copies/mL). A negative result must be combined with clinical observations, patient history, and epidemiological information. The expected result is Negative.  Fact Sheet for Patients:  EntrepreneurPulse.com.au  Fact Sheet for Healthcare Providers:  IncredibleEmployment.be  This test is no t yet approved or cleared by the [place] FDA and  has been authorized for detection and/or diagnosis of SARS-CoV-2 by FDA under an Emergency Use Authorization (EUA). This EUA will remain  in effect (meaning this test can be used) for the duration of the COVID-19 declaration under Section 564(b)(1) of the Act, 21 U.S.C.section 360bbb-3(b)(1), unless the authorization is terminated  or revoked sooner.       Influenza A by PCR  NEGATIVE NEGATIVE Final   Influenza B by PCR NEGATIVE NEGATIVE Final    Comment: (NOTE) The Xpert Xpress SARS-CoV-2/FLU/RSV plus assay is intended as an aid in the diagnosis of influenza from Nasopharyngeal swab specimens and should not be used as a sole basis for treatment. Nasal washings and aspirates are unacceptable for Xpert Xpress SARS-CoV-2/FLU/RSV testing.  Fact Sheet for  Patients: EntrepreneurPulse.com.au  Fact Sheet for Healthcare Providers: IncredibleEmployment.be  This test is not yet approved or cleared by the [place] FDA and has been authorized for detection and/or diagnosis of SARS-CoV-2 by FDA under an Emergency Use Authorization (EUA). This EUA will remain in effect (meaning this test can be used) for the duration of the COVID-19 declaration under Section 564(b)(1) of the Act, 21 U.S.C. section 360bbb-3(b)(1), unless the authorization is terminated or revoked.     Resp Syncytial Virus by PCR NEGATIVE NEGATIVE Final    Comment: (NOTE) Fact Sheet for Patients: EntrepreneurPulse.com.au  Fact Sheet for Healthcare Providers: IncredibleEmployment.be  This test is not yet approved or cleared by the [place] FDA and has been authorized for detection and/or diagnosis of SARS-CoV-2 by FDA under an Emergency Use Authorization (EUA). This EUA will remain in effect (meaning this test can be used) for the duration of the COVID-19 declaration under Section 564(b)(1) of the Act, 21 U.S.C. section 360bbb-3(b)(1), unless the authorization is terminated or revoked.  Performed at [facility], [address] [place], [place] [address]   Culture, blood (Routine x 2)     Status: None (Preliminary result)   Collection Time: 06/26/22  1:40 PM   Specimen: BLOOD  Result Value Ref Range Status   Specimen Description   Final    BLOOD LEFT ANTECUBITAL Performed at [facility], [address] [place], [place] [address]    Special Requests   Final    BOTTLES DRAWN AEROBIC AND ANAEROBIC Blood Culture adequate volume Performed at [facility], [address] [place], [place] [address]    Culture   Final    NO GROWTH 3 DAYS Performed at [facility], [place] [address] [place], [place] [address]    Report Status PENDING  Incomplete   Respiratory (~20 pathogens) panel by PCR     Status: Abnormal   Collection Time: 06/26/22  7:34 PM   Specimen: Nasopharyngeal Swab; Respiratory  Result Value Ref Range Status   Adenovirus NOT DETECTED NOT DETECTED Final   Coronavirus 229E NOT DETECTED NOT DETECTED Final    Comment: (NOTE) The Coronavirus on the Respiratory Panel, DOES NOT test for the novel  Coronavirus (2019 nCoV)    Coronavirus HKU1 NOT DETECTED NOT DETECTED Final   Coronavirus NL63 NOT DETECTED NOT DETECTED Final   Coronavirus OC43 NOT DETECTED NOT DETECTED Final   Metapneumovirus DETECTED (A) NOT DETECTED Final   Rhinovirus / Enterovirus NOT DETECTED NOT DETECTED Final   Influenza A NOT DETECTED NOT DETECTED Final   Influenza B NOT DETECTED NOT DETECTED Final   Parainfluenza Virus 1 NOT DETECTED NOT DETECTED Final   Parainfluenza Virus 2 NOT DETECTED NOT DETECTED Final   Parainfluenza Virus 3 NOT DETECTED NOT DETECTED Final   Parainfluenza Virus 4 NOT DETECTED NOT DETECTED Final   Respiratory Syncytial Virus NOT DETECTED NOT DETECTED Final   Bordetella pertussis NOT DETECTED NOT DETECTED Final   Bordetella Parapertussis NOT DETECTED NOT DETECTED Final   Chlamydophila pneumoniae NOT DETECTED NOT DETECTED Final   Mycoplasma pneumoniae NOT  DETECTED NOT DETECTED Final    Comment: Performed at [facility], [place] [address] [place], [place] [address]    Please note: You were cared for by a hospitalist during your hospital stay. Once you are discharged, your primary care physician will handle any further medical issues. Please note that NO REFILLS for any discharge medications will be authorized once you are discharged, as it is imperative that you return to your primary care physician (or establish a relationship with a primary care physician if you do not have one) for your post hospital discharge needs so that they can reassess your need for medications and monitor your lab values.    Time coordinating  discharge: 40 minutes  SIGNED:   Shelly Coss, MD  [facility] 06/29/2022, 10:47 AM Pager [phone]  If 7PM-7AM, please contact night-coverage [email or web address] Password [email or web address]

## 2022-06-29 NOTE — Inpatient Diabetes Management (Signed)
Inpatient Diabetes Program Recommendations  AACE/ADA: New Consensus Statement on Inpatient Glycemic Control (2015)  Target Ranges:  Prepandial:   less than 140 mg/dL      Peak postprandial:   less than 180 mg/dL (1-2 hours)      Critically ill patients:  140 - 180 mg/dL   Lab Results  Component Value Date   GLUCAP 108 (H) 06/29/2022   HGBA1C 10.4 (H) 02/26/2022    Review of Glycemic Control  Latest Reference Range & Units 06/28/22 16:40 06/28/22 21:57 06/29/22 06:48 06/29/22 07:14  Glucose-Capillary 70 - 99 mg/dL 245 (H) 140 (H) 66 (L) 108 (H)  (H): Data is abnormally high (L): Data is abnormally low Diabetes history: Type 2 DM Outpatient Diabetes medications: Lantus 30 units QD, Trulicity [id number] mg qwk, Metformin 500 mg QD Current orders for Inpatient glycemic control: Semglee 25 units QD, Novolog 0-15 units TID & HS  Inpatient Diabetes Program Recommendations:    Noted hypoglycemia this Am of 66 mg/dL. Consider: -Reducing Semglee 22 units QD -Reducing correction to Novolog 0-9 units TID & HS -If appropriate, carb modified diet?   Thanks, Bronson Curb, MSN, RNC-OB Diabetes Coordinator [phone] (8a-5p)

## 2022-06-29 NOTE — Progress Notes (Signed)
Patient was given discharge instructions, and all questions were answered.  Patient was stable for discharge and was taken to the main exit by wheelchair. 

## 2022-06-29 NOTE — Progress Notes (Signed)
[name] says she is feeling better.  She is still coughing up mucus.  Blood cultures are negative.  She did have a positive viral titer in the respiratory panel.  She says she is improving.  She still having some back discomfort.  We will have to see if Radiation Oncology can see her and see about palliative radiotherapy to her back.  I will also see about giving her some Zometa.  Her labs show sodium 138.  Potassium 3.7.  BUN 8 creatinine 0.48.  Calcium 8.3 with an albumin of 2.8.  Her white cell count is 4.8.  Hemoglobin 10.0.  Platelet count 119,000.  She is not have any nausea vomiting.  She is urinating okay.  There is no diarrhea.  Vital signs show temperature 97.8.  Pulse 79.  Blood pressure 120/84.  Her lungs sound a bit better.  There is still little wheezing.  Of note, she is still on nebulizers.  Cardiac exam regular rate and rhythm.  Abdomen is soft.  Bowel sounds are present.  There is no fluid wave.  Extremity shows no clubbing, cyanosis or edema.  Neurological exam is nonfocal.   [name] has a [age] coming up on Easter Sunday.  Hopefully she will be out of the hospital.  She has extensive stage small cell lung cancer.  She has been through 2 different lines of treatment.  We really have very little options left.  She really needs to improve her performance status before we would treat her.  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  We will give her a dose of Zometa today.  I do appreciate everybody's help on 5 E.  Everybody really is doing a great job with her.  [name], MD  Lu[facility] Joiner 23:13

## 2022-06-29 NOTE — Progress Notes (Signed)
Patient was scheduled to come into the office today to discuss recent PET which shows progression. She was admitted to the hospital on 06/26/2022 with AMS and pneumonia.   Oncology Nurse Navigator Documentation     06/29/2022    9:00 AM  Oncology Nurse Navigator Flowsheets  Navigator Location [facility]  Navigator Encounter Type Appt/Treatment Plan Review  Patient Visit Type MedOnc  Treatment Phase Active Tx  Barriers/Navigation Needs Coordination of Care;Education  Interventions None Required  Acuity Level 2-Minimal Needs (1-2 Barriers Identified)  Support Groups/Services Friends and Family  Time Spent with Patient 15

## 2022-06-30 ENCOUNTER — Encounter: Payer: Self-pay | Admitting: *Deleted

## 2022-06-30 ENCOUNTER — Telehealth: Payer: Self-pay | Admitting: *Deleted

## 2022-06-30 NOTE — Transitions of Care (Post Inpatient/ED Visit) (Signed)
06/30/2022  Name: [name] MRN: [id number] DOB: [birth year]  Today's TOC FU Call Status: Today's TOC FU Call Status:: Successful TOC FU Call Competed TOC FU Call Complete Date: 06/30/22  Transition Care Management Follow-up Telephone Call Date of Discharge: 06/29/22 Discharge Facility: [facility]) Type of Discharge: Inpatient Admission Primary Inpatient Discharge Diagnosis:: Acute Respiratory Failure in setting of lung CA with mets How have you been since you were released from the hospital?: Better ("I am much better and doing well.  I got all of the new medicine and am taking it like I am supposed to.  Everything is going fine since I got home from the hospital") Any questions or concerns?: No  Items Reviewed: Did you receive and understand the discharge instructions provided?: Yes (thoroughly reviewed with patient who verbalizes excellent understanding of same) Medications obtained and verified?: Yes (Medications Reviewed) (Full medication review completed; no concerns or discrepancies identified; confirmed patient obtained/ is taking all newly Rx'd medications as instructed; self-manages medications and denies questions/ concerns around medications today) Any new allergies since your discharge?: No Dietary orders reviewed?: Yes Type of Diet Ordered:: Regular-- "try to eat as healthy as I can" Do you have support at home?: Yes People in Home: spouse Name of Support/Comfort Primary Source: Resides with supportive husband; reports she is essentially independent in self- care activities  Home Care and Equipment/Supplies: [place] Ordered?: No Any new equipment or medical supplies ordered?: No  Functional Questionnaire: Do you need assistance with bathing/showering or dressing?: No Do you need assistance with meal preparation?: No Do you need assistance with eating?: No Do you have difficulty maintaining continence: No Do you need assistance with getting  out of bed/getting out of a chair/moving?: No Do you have difficulty managing or taking your medications?: No  Follow up appointments reviewed: PCP Follow-up appointment confirmed?: Yes (care coordination outreach in real-time with scheduling care guide to successfully schedule hospital follow up PCP appointment 07/02/22) Date of PCP follow-up appointment?: 07/02/22 Follow-up Provider: PCP [facility] Follow-up appointment confirmed?: Yes Date of Specialist follow-up appointment?: 07/01/22 Follow-Up Specialty Provider:: oncology radiolody provider Do you need transportation to your follow-up appointment?: No Do you understand care options if your condition(s) worsen?: Yes-patient verbalized understanding  SDOH Interventions Today    Flowsheet Row Most Recent Value  SDOH Interventions   Food Insecurity Interventions Intervention Not Indicated  Transportation Interventions Intervention Not Indicated  [husband provides transportation]      TOC Interventions Today    Flowsheet Row Most Recent Value  TOC Interventions   TOC Interventions Discussed/Reviewed TOC Interventions Discussed, Arranged PCP follow up within 7 days/Care Guide scheduled  [Patient declines need for ongoing/ further care coordination outreach,  no care coordination needs identified at time of TOC call today,  provided my direct contact information should questions/ concerns/ needs arise post-TOC call]      Interventions Today    Flowsheet Row Most Recent Value  Chronic Disease   Chronic disease during today's visit Other  [pneumonia in setting of lung CA with mets]  General Interventions   General Interventions Discussed/Reviewed General Interventions Discussed, Doctor Visits  Doctor Visits Discussed/Reviewed Doctor Visits Discussed, Specialist, PCP  PCP/Specialist Visits Compliance with follow-up visit  Nutrition Interventions   Nutrition Discussed/Reviewed Nutrition Discussed  Pharmacy Interventions    Pharmacy Dicussed/Reviewed Pharmacy Topics Discussed  [Full medication review with updating medication list in EHR per patient report]      Oneta Rack, RN, BSN, CCRN Alumnus  RN CM Care Coordination/ Transition of [place] Management [phone]: direct office

## 2022-06-30 NOTE — Progress Notes (Signed)
Radiation Oncology         [phone] ________________________________  Outpatient Re-Consultation  Name: [name] MRN: [id number]  Date: 07/01/2022  DOB: 1950-06-15  [id number] Chase, Alferd Apa, DO  Ennever, Rudell Cobb, MD   REFERRING PHYSICIAN: [name], MD  DIAGNOSIS: The primary encounter diagnosis was Metastatic cancer to spine [facility]). A diagnosis of Small cell lung cancer, left upper lobe (HCC) was also pertinent to this visit.  The primary encounter diagnosis was Small cell lung cancer, left upper lobe ([place]). 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 Isidore Moos. Recent MRI of the lumbar spine and PET scan showed interval disease progression including new osseous disease to the lumbar spine.   Interval Since Last Radiation: 2 months and 3 days   Intent: Palliative Radiation Treatment Dates: 04/29/2022  Site Technique Total Dose (Gy) Dose per Fx (Gy) Completed Fx Beam Energies  Brain: Brain_SRS IMRT 20/20 20 1/1 6XFFF   Intent: Curative Radiation Treatment Dates: 11/27/2021 through 01/08/2022 Site Technique Total Dose (Gy) Dose per Fx (Gy) Completed Fx Beam Energies  Lung, Left: Lung_L 3D 60/60 2 30/30 6X    Narrative / Interval History - Re-consultation 07/01/22 ::[name] is a 72 y.o. female who is accompanied by her husband. 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 Marin Olp for re-evaluation and an opinion concerning radiation therapy as part of management for her recently diagnosed osseous metastases to the lumbosacral spine.   The patient was last seen by Dr. Isidore Moos on 06/02/22 for follow-up of brain SRS for  her metastatic disease to the brain (detailed below). The patient tolerated brain SRS well and did not experience any new neurologic deficits with treatment. Dr. Isidore Moos arranged for a follow-up MRI of the brain 2 months out from her last visit. The patient also completed systemic therapy consisting of monotherapy-lurbinectedin on 05/29/2022.   In the interval since she was last seen buy Dr. Isidore Moos, the patient presented for an MRI of lumbar spine on 06/03/22 for evaluation of new lower back pain which demonstrated: new enhancing lesions of the left pedicle of L1, in the left transverse process of L2 (with a surrounding soft tissue component), and in the junction of the left pedicle and transverse process of L3, concerning for metastatic disease. MRI also partially showed marrow replacement with heterogeneous enhancement in the left iliac crest and right sacral ala, concerning for metastatic disease. The known 2.7 cm left adrenal mass was also seen and appeared unchanged in the interval, but remains consistently suspicious for a site of metastatic disease.   The patient also presented to her family physician, Dr. Nani Ravens, on 06/05/22, with consistent c/o sharp left sided back pain. She detailed that her pain began 6 days prior after moving some boxes several days before her onset of pain.   Restaging PET scan on 06/19/22 unfortunately showed evidence of overall disease progression, demonstrated by: new hypermetabolic mediastinal adenopathy, a progressive hypermetabolic pleural nodularity in the left hemithorax, an enlarging hypermetabolic gastrohepatic ligament lymph node, and new/increasing hypermetabolic lesions throughout the visualized osseous structures, including a new hypermetabolism in the coracoid process of the left scapula with a SUV max  of 11.8. PET also showed an increase in size but a decrease in hypermetabolism of the right hepatic lobe mass and left adrenal nodule, and a small pericardial  effusion.   The following week, the patient presented to the ED on 06/26/22 with increasing shortness of breath, confusion, runny nose, congestion, cough, and fever. On ED presentation, she was found to be hypotensive. Chest x-ray showed a retrocardiac opacity consistent with left lower lobe pneumonia. Respiratory viral panel was also sent in and came back positive with metapneumovirus. She was accordingly admitted and received abx. Follow-up chest x-ray performed on 03/23 showed chronic interstitial lung disease with bibasilar scarring and retrocardiac left base collapse/consolidation. She was  discharged on 06/29/22. She also had a head CT performed while inpatient on 03/22 (in the setting of confusion on presentation) which showed no acute intracranial findings.   Dr. Marin Olp was consulted while the patient was admitted to review her recent PET findings. For her spinal involvement, Dr. Marin Olp recommends radiation therapy which we will discuss in detail today. Dr. Marin Olp does not recommend any further systemic therapy at this time due to her overall state, and based on the fact that she has developed disease progression despite completing 2 lines of chemotherapy.   Re-consultation with Dr. Isidore Moos on 04/24/22 for SRS ::Keicha A Defrancesco is a 72 y.o. female who presents today for consideration of SRS in management of her recently diagnosed brain metastasis from left lung cancer primary. The patient was seen in consultation by Dr. Sondra Come on 04/16/22. During which time, Dr. Sondra Come discussed the indication for [facility] based on the patient's poor prognosis and small tumor size. To review, MRI of the brain with and without contrast on 04/09/21 first demonstrated interval development of 3 x 7 mm enhancing lesion in the right posterior cerebellum compatible with metastatic disease, and new enhancing lesions in the calvarium compatible with metastatic disease.   Dr. Sondra Come has since ordered an MRI if the brain with and  without contrast, performed on 04/22/21, which demonstrated two small ill-defined areas of enhancement in the right cerebellar hemisphere suspicious for metastatic disease. One of the appreciable lesions correlates with the previously seen lesion in the right cerebellar hemisphere, measuring up to 0.7 cm. The other lesion/enhancement (measuring 5 mm) is located in the more superolateral right cerebellar hemisphere, and was not appreciated on her initial MRI. MRI otherwise showed stability of the metastatic lesions in the calvarium.     HPI Initial Consultation on 04/16/22:: Shanvika A Ridgley is a 72 y.o. female who is accompanied by her supportive husband. She is seen as a courtesy of Dr. Marin Olp for an opinion concerning further radiation therapy as part of management for her recent recurrence of metastatic lung cancer. I last met with the patient on 02/09/22 for follow-up of radiation to the left lung. She was doing well and NED on examination at that time.    In the interval since her last visit, the patient presented to an urgent care on 02/25/22 with hypoxia. On UC exam, she was found to be febrile and hypoxic and was advised to present to the ED for further management. CTA of the chest performed on admission was positive for PE involving the right upper lung distal segmental and subsegmental vessels. There was also concern for underlying airspace disease suspicious for multifocal pneumonia. CT also showed prominent mediastinal and abdominal lymph nodes (likely metastatic), and multiple known bone metastases. MRI of the liver performed on 12/03 showed hemosiderosis as well as 1.3  cm left adrenal mass concerning for adrenal metastasis. RUQ abdominal ultrasound also performed showed a mass-like hypoechoic region is identified in the right hepatic lobe measuring up to 3.2 cm Hospital course included supplemental O2, anticoagulation which was transitioned to Lovenox per oncology recommendations, antibiotics, and  steroid taper. The patient was discharged on 03/12/22 with home oxygen given persistent exertional hypoxia.    Following discharge, the patient had a restaging PET scan performed on 04/02/22 which showed: post treatment scarring in the parahilar lower left lung with hypermetabolism in the posterior paraspinal component of the scarring (recurrent disease not excluded); interval development of hypermetabolic nodal disease in the mediastinum and left hilum; a 1.5 cm hypermetabolic soft tissue nodule in the deep posterior left costophrenic sulcus consistent with metastatic disease; new hypermetabolic disease in the liver, upper midline abdomen and left adrenal gland; and new bony disease in the cervical spine, right mandible, right humerus, right clavicle, bilateral ribs, thoracic spine, lumbar spine, and bony pelvis. PET also demonstrated the previously seen scattered lytic lesions with sclerotic borders as non-hypermetabolic, and low-level FDG accumulation in the distal esophagus possibly related to esophagitis.      For her recurrent disease, Dr. Marin Olp has recommended lurbinectedin (Zepzelca) x3 cycles. Dr. Marin Olp has informed the patient and her husband that if lurbinectedin works, she may possibly be with [place] for one more year. If treatment does not work, she may not be with [place] in 3 to 4 months. The patient understands this and is willing to pursue treatment. She received her first infusion yesterday.    Pertinent imaging thus far includes an MRI of the brain with and without contrast on 04/09/21 which shows interval development of 3 x 7 mm enhancing lesion in the right posterior cerebellum compatible with metastatic disease, and new enhancing lesions in the calvarium compatible with metastatic disease.   Today she reports an episode of dizziness this morning that she attributes to her chemotherapy. She otherwise denies any troubles with gait, numbness, tingling, vision changes.  She reports some mild  headaches.  I personally reviewed her images.  On evaluation today the patient complains of pain in the left sacroiliac joint region.  She denies any other areas of pain at this time.   PAST MEDICAL HISTORY:  Past Medical History:  Diagnosis Date   Arthritis    Cancer ([place])    Diabetes mellitus    Esophagitis    Goals of care, counseling/discussion 05/23/2021   Hiatal hernia    History of radiation therapy    Left Lung- 11/27/21-01/08/22- Dr. 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[place]) 06/06/2021   Small cell lung cancer, left upper lobe ([place]) 06/06/2021   Thyroid disease    TMJ (sprain of temporomandibular joint)     PAST SURGICAL HISTORY: Past Surgical History:  Procedure Laterality Date   ABDOMINAL HYSTERECTOMY     BACK SURGERY     x3   BACK SURGERY     x 5 , 2 neck surgeries, 2 lower back surgeries   BRONCHIAL BIOPSY  06/02/2021   Procedure: BRONCHIAL BIOPSIES;  Surgeon: Collene Gobble, MD;  Location: [facility];  Service: Pulmonary;;   BRONCHIAL BRUSHINGS  06/02/2021   Procedure:  BRONCHIAL BRUSHINGS;  Surgeon: Collene Gobble, MD;  Location: [facility] ENDOSCOPY;  Service: Pulmonary;;   BRONCHIAL NEEDLE ASPIRATION BIOPSY  06/02/2021   Procedure: BRONCHIAL NEEDLE ASPIRATION BIOPSIES;  Surgeon: Collene Gobble, MD;  Location: [facility] ENDOSCOPY;  Service: Pulmonary;;   CHOLECYSTECTOMY     COLONOSCOPY     ERCP     ESOPHAGOGASTRODUODENOSCOPY     IR IMAGING GUIDED PORT INSERTION  06/13/2021   THYROID SURGERY     TONSILECTOMY, ADENOIDECTOMY, BILATERAL MYRINGOTOMY AND TUBES     VIDEO BRONCHOSCOPY WITH ENDOBRONCHIAL ULTRASOUND Left 06/02/2021   Procedure: VIDEO BRONCHOSCOPY WITH ENDOBRONCHIAL ULTRASOUND;  Surgeon: Collene Gobble, MD;  Location: [facility]  ENDOSCOPY;  Service: Pulmonary;  Laterality: Left;    FAMILY HISTORY:  Family History  Problem Relation Age of Onset   Stroke Mother    Alcohol abuse Mother    Cancer Brother        leukemia   Stroke Maternal Grandmother    Heart disease Maternal Grandfather    Cancer Daughter        breast cancer   Breast cancer Daughter    Breast cancer Cousin    Colon cancer Neg Hx    Esophageal cancer Neg Hx    Rectal cancer Neg Hx    Stomach cancer Neg Hx     SOCIAL HISTORY:  Social History   Tobacco Use   Smoking status: Former    Packs/day: 1.00    Years: 48.00    Additional pack years: 0.00    Total pack years: 48.00    Types: Cigarettes    Quit date: 02/01/2017    Years since quitting: 5.4   Smokeless tobacco: Never  Vaping Use   Vaping Use: Never used  Substance Use Topics   Alcohol use: Never   Drug use: No    ALLERGIES:  Allergies  Allergen Reactions   Hydrocodone-Acetaminophen Other (See Comments)    Nightmares, skin crawling   Lyrica [Pregabalin] Other (See Comments)    Hallucinations    Crestor [Rosuvastatin Calcium] Other (See Comments)    Leg cramps    MEDICATIONS:  Current Outpatient Medications  Medication Sig Dispense Refill   acetaminophen (TYLENOL) 325 MG tablet Take 1 tablet (325 mg total) by mouth every 6 (six) hours as needed for moderate pain.     albuterol (VENTOLIN HFA) 108 (90 Base) MCG/ACT inhaler Inhale 2 puffs into the lungs every 6 (six) hours as needed for wheezing or shortness of breath. 8 g 2   ALPRAZolam (XANAX) 0.5 MG tablet Take 1 tablet (0.5 mg total) by mouth 3 (three) times daily as needed. 90 tablet 1   apixaban (ELIQUIS) 5 MG TABS tablet Take 1 tablet (5 mg total) by mouth 2 (two) times daily. 60 tablet 0   azithromycin (ZITHROMAX) 500 MG tablet Take 1 tablet (500 mg total) by mouth daily for 2 days. Take 1 tablet daily for 3 days. 2 tablet 0   blood glucose meter kit and supplies KIT Use daily 1 each 0   Blood Glucose  Monitoring Suppl (ONE TOUCH ULTRA 2) w/Device KIT Check blood sugar as directed. Dx:E11.65 1 kit 0   Blood Glucose Monitoring Suppl (ONETOUCH VERIO FLEX SYSTEM) w/Device KIT USE AS DIRECTED TO CHECK BLOOD SUGAR FOUR TIMES A DAY (Patient taking differently: 1 each by Other route 4 (four) times daily.) 1 kit 0   cefdinir (OMNICEF) 300 MG capsule Take 1 capsule (300 mg total) by mouth 2 (two) times daily for 4 days.  8 capsule 0   FLUoxetine (PROZAC) 20 MG capsule Take 1 capsule (20 mg total) by mouth daily. 90 capsule 3   fluticasone (FLONASE) 50 MCG/ACT nasal spray Place 1 spray into both nostrils daily. 11.1 mL 1   glucose blood (ONETOUCH VERIO) test strip USE ONE STRIP TO TEST DAILY (Patient taking differently: 1 each by Other route daily.) 100 strip 1   guaiFENesin (MUCINEX) 600 MG 12 hr tablet Take 1 tablet (600 mg total) by mouth 2 (two) times daily for 7 days. 14 tablet 0   insulin glargine (LANTUS SOLOSTAR) 100 UNIT/ML Solostar Pen Inject 30 Units into the skin daily. HS (Patient taking differently: Inject 30 Units into the skin daily.)     Insulin Pen Needle (NOVOTWIST) 32G X 5 MM MISC Use daily at Bedtime with Lantus 100 each 0   ipratropium-albuterol (DUONEB) 0.5-2.5 (3) MG/3ML SOLN Take 3 mLs by nebulization every 6 (six) hours as needed. 360 mL 3   levothyroxine (SYNTHROID) 125 MCG tablet Take 1 tablet (125 mcg total) by mouth daily before breakfast. 90 tablet 3   lidocaine-prilocaine (EMLA) cream Apply 1 Application topically as needed. 30 g 0   meloxicam (MOBIC) 15 MG tablet TAKE 1 TABLET BY MOUTH DAILY 30 tablet 2   metFORMIN (GLUCOPHAGE-XR) 500 MG 24 hr tablet Take 1 tablet (500 mg total) by mouth in the morning and at bedtime. 180 tablet 0   midodrine (PROAMATINE) 5 MG tablet Take 1 tablet (5 mg total) by mouth 3 (three) times daily with meals. 90 tablet 0   omeprazole (PRILOSEC) 20 MG capsule Take 1 capsule (20 mg total) by mouth daily. (Patient taking differently: Take 20 mg by mouth  at bedtime.) 90 capsule 3   oxyCODONE-acetaminophen (PERCOCET) 7.5-325 MG tablet Take 1 tablet by mouth every 6 (six) hours as needed for severe pain. 90 tablet 0   TRULICITY [id number] [id number] SOPN Inject 0.75 mg into the skin every Saturday.     valACYclovir (VALTREX) 1000 MG tablet TAKE ONE TABLET BY MOUTH DAILY AS NEEDED (Patient taking differently: Take 1,000 mg by mouth daily as needed (breakout).) 30 tablet 0   No current facility-administered medications for this encounter.   Facility-Administered Medications Ordered in Other Encounters  Medication Dose Route Frequency Provider Last Rate Last Admin   sodium chloride flush (NS) 0.9 % injection 10 mL  10 mL Intravenous PRN Celso Amy, NP   10 mL at 08/18/21 [id number]    REVIEW OF SYSTEMS:  A 10+ POINT REVIEW OF SYSTEMS WAS OBTAINED including neurology, dermatology, psychiatry, cardiac, respiratory, lymph, extremities, GI, GU, musculoskeletal, constitutional, reproductive, HEENT.  Left sacral pain as above.   PHYSICAL EXAM:  height is 4\' 11"  (1.499 m) and weight is 115 lb 2 oz (52.2 kg). Her temporal temperature is 97.1 F (36.2 C) (abnormal). Her blood pressure is 130/87 and her pulse is 103 (abnormal). Her respiration is 20 and oxygen saturation is 93%.   General: Alert and oriented, in no acute distress HEENT: Head is normocephalic. Extraocular movements are intact.  Neck: Neck is supple, no palpable cervical or supraclavicular lymphadenopathy. Heart: Regular in rate and rhythm with no murmurs, rubs, or gallops. Chest: Clear to auscultation bilaterally, with no rhonchi, wheezes, or rales. Abdomen: Soft, nontender, nondistended, with no rigidity or guarding. Extremities: No cyanosis or edema. Lymphatics: see Neck Exam Skin: No concerning lesions. Musculoskeletal: symmetric strength and muscle tone throughout.  Some tenderness with palpation along the left sacroiliac joint region. Neurologic: Cranial nerves II  through XII are grossly  intact. No obvious focalities. Speech is fluent. Coordination is intact. Psychiatric: Judgment and insight are intact. Affect is appropriate.   ECOG = 1  0 - Asymptomatic (Fully active, able to carry on all predisease activities without restriction)  1 - Symptomatic but completely ambulatory (Restricted in physically strenuous activity but ambulatory and able to carry out work of a light or sedentary nature. For example, light housework, office work)  2 - Symptomatic, <50% in bed during the day (Ambulatory and capable of all self care but unable to carry out any work activities. Up and about more than 50% of waking hours)  3 - Symptomatic, >50% in bed, but not bedbound (Capable of only limited self-care, confined to bed or chair 50% or more of waking hours)  4 - Bedbound (Completely disabled. Cannot carry on any self-care. Totally confined to bed or chair)  5 - Death   Eustace Pen MM, Creech RH, Tormey DC, et al. [phone]). "Toxicity and response criteria of the [facility] Group". [place] Oncol. 5 (6): 649-55  LABORATORY DATA:  Lab Results  Component Value Date   WBC 4.8 06/29/2022   HGB 10.0 (L) 06/29/2022   HCT 30.8 (L) 06/29/2022   MCV 97.5 06/29/2022   PLT 119 (L) 06/29/2022   NEUTROABS 3.1 06/29/2022   Lab Results  Component Value Date   NA 138 06/29/2022   K 3.7 06/29/2022   CL 104 06/29/2022   CO2 26 06/29/2022   GLUCOSE 63 (L) 06/29/2022   BUN 8 06/29/2022   CREATININE 0.48 06/29/2022   CALCIUM 8.3 (L) 06/29/2022      RADIOGRAPHY: DG CHEST PORT 1 VIEW  Result Date: 06/27/2022 CLINICAL DATA:  Shortness of breath. EXAM: PORTABLE CHEST 1 VIEW COMPARISON:  06/26/2022 FINDINGS: Cardiopericardial silhouette is at upper limits of normal for size. Parenchymal scarring at the lung bases is similar to prior. Similar appearance of the retrocardiac left base collapse/consolidative opacity. Interstitial markings are diffusely coarsened with chronic features.  Nodular fullness in the right hilar region is similar. Right Port-A-Cath again noted. Telemetry leads overlie the chest. IMPRESSION: 1. Chronic interstitial lung disease with bibasilar scarring and similar retrocardiac left base collapse/consolidation. 2. Nodular fullness in the right hilar region is similar to prior. Electronically Signed   By: Misty Stanley M.D.   On: 06/27/2022 13:56   CT Head Wo Contrast  Result Date: 06/26/2022 CLINICAL DATA:  Altered mental status, confusion, history of lung carcinoma EXAM: CT HEAD WITHOUT CONTRAST TECHNIQUE: Contiguous axial images were obtained from the base of the skull through the vertex without intravenous contrast. RADIATION DOSE REDUCTION: This exam was performed according to the departmental dose-optimization program which includes automated exposure control, adjustment of the mA and/or kV according to patient size and/or use of iterative reconstruction technique. COMPARISON:  Previous studies including CT brain done on 11/30/2017 and MR brain done on 04/22/2022 FINDINGS: Brain: No acute intracranial findings are seen. Ventricles are not dilated. There is no focal edema or mass effect. There are no signs of bleeding within the cranium. Vascular: Unremarkable. Skull: No focal lytic lesions are seen. Sinuses/Orbits: Mucosal thickening is seen in ethmoid and maxillary sinuses. There are fewer than usual mastoid air cells. Other: None. IMPRESSION: No acute intracranial findings are seen. There are no signs of bleeding within the cranium. There is no focal edema or mass effect. Electronically Signed   By: Elmer Picker M.D.   On: 06/26/2022 15:06   DG Chest [facility]  1 View  Result Date: 06/26/2022 CLINICAL DATA:  Confusion. History of lung cancer. Diarrhea, vomiting, coughing this morning. EXAM: PORTABLE CHEST 1 VIEW COMPARISON:  Chest radiograph 05/05/2022, 03/11/2022; PET-CT 06/19/2022 FINDINGS: Right IJ power injectable port central venous catheter tip projects  at the level of the superior cavoatrial junction. Retrocardiac airspace opacity corresponds to the left lower lobe consolidation recent PET-CT 06/19/2022. Right basilar subsegmental atelectasis. No pleural effusion or pneumothorax. The heart size and mediastinal contours are within normal limits. Stable appearance of the anterior right fourth rib. No acute osseous abnormality. Partially visualized cervical spinal fixation. IMPRESSION: 1. Retrocardiac airspace opacity corresponds to the left lower lobe consolidation recent PET-CT 06/19/2022, possibly reflecting pneumonia in the appropriate clinical context versus posttreatment collapse of the medial left lower lobe as described on recent PET-CT report. 2. Right basilar subsegmental atelectasis. Electronically Signed   By: Ileana Roup M.D.   On: 06/26/2022 14:09   NM PET Image Restag (PS) Skull Base To Thigh  Result Date: 06/22/2022 CLINICAL DATA:  Subsequent treatment strategy for small cell lung cancer, assess treatment response. EXAM: NUCLEAR MEDICINE PET SKULL BASE TO THIGH TECHNIQUE: 5.6 mCi F-18 FDG was injected intravenously. Full-ring PET imaging was performed from the skull base to thigh after the radiotracer. CT data was obtained and used for attenuation correction and anatomic localization. Fasting blood glucose: 191 mg/dl COMPARISON:  04/02/2022. FINDINGS: Mediastinal blood pool activity: SUV max 2.5 Liver activity: SUV max NA NECK: No abnormal hypermetabolism. Incidental CT findings: None. CHEST: Hypermetabolic mediastinal adenopathy has progressed with a new prevascular lymph node measuring 9 mm (4/60), SUV max 19.0. No hypermetabolic hilar or axillary adenopathy. Overall decreased extent of hypermetabolism associated with post treatment collapse/consolidation in the medial left lower lobe, SUV max 6.8. New hypermetabolic subpleural nodule in the medial left lower lobe is seen below the level of radiation scarring, measuring 8 mm (7 x 9 mm, 7/47),  SUV max 16.3. Pleural thickening and nodularity in the posterior inferior left hemithorax measures up to 1.7 x 2.3 cm (4/96), SUV max 30.8. Incidental CT findings: Right IJ Port-A-Cath terminates in the right atrium. Small pericardial effusion. There may be trace left pleural fluid. ABDOMEN/PELVIS: Centrally necrotic mass in the dome of the right hepatic lobe measures 4.2 x 5.3 cm, enlarged from 2.9 cm on 04/02/2022, SUV max 6.8, previously 8.8. 1.9 x 2.4 cm left adrenal nodule, increased in size from 1.5 x 1.8 cm, SUV max 10.7, compared to 72.5 previously. Gastrohepatic ligament lymph node measures 10 mm, enlarged from 5 mm, SUV max 14.5. Decreased hypermetabolism associated with the pancreatic head region, SUV max 4.2, previously 11.8. Again, no definite CT correlate is identified. Incidental CT findings: Liver is otherwise grossly unremarkable. Cholecystectomy. Adrenal glands are otherwise unremarkable. Low-attenuation lesion in the upper pole right kidney. No specific follow-up necessary. Kidneys, spleen, pancreas, stomach and bowel are grossly unremarkable. SKELETON: New and increasingly hypermetabolic lesions throughout the visualized osseous structures. New hypermetabolism in the coracoid process of the left scapula, SUV max 11.8. Incidental CT findings: Postoperative changes in the lumbar spine. Lytic lesions are scattered in the spine. IMPRESSION: 1. Overall progression in extensive stage squamous cell carcinoma with new hypermetabolic mediastinal adenopathy, progressive hypermetabolic pleural nodularity in the left hemithorax, enlarging hypermetabolic gastrohepatic ligament lymph node and new/increasing the hypermetabolic osseous lesions. Right hepatic lobe mass and left adrenal nodule have increased in size but decreased in hypermetabolism. 2. Small pericardial effusion. Electronically Signed   By: Lorin Picket M.D.   On:  06/22/2022 14:59   MR Lumbar Spine W Wo Contrast  Result Date:  06/04/2022 CLINICAL DATA:  Low back pain, cancer suspected. Evaluate for lumbar spine metastasis. EXAM: MRI LUMBAR SPINE WITHOUT AND WITH CONTRAST TECHNIQUE: Multiplanar and multiecho pulse sequences of the lumbar spine were obtained without and with intravenous contrast. CONTRAST:  70mL GADAVIST GADOBUTROL 1 MMOL/ML IV SOLN COMPARISON:  PET-CT 04/02/2022.  Lumbar spine MRI 05/02/2021. FINDINGS: Segmentation: Conventional numbering is assumed with 5 non-rib-bearing, lumbar type vertebral bodies. Alignment:  Normal. Vertebrae: Unchanged postoperative appearance from prior L4-S1 interbody and posterior spinal fusion. Unchanged small fluid collection in the laminectomy bed, likely seroma. New enhancing lesions in the left pedicle of L1 (axial image 7 series 9/11), in the left transverse process of L2 with surrounding soft tissue component (axial image 13 series 11) and in the junction of the left pedicle and transverse process of L3 (axial image 18 series 9/11) partially imaged marrow replacement with heterogeneous enhancement in the left iliac crest and right sacral ala (axial image 36 series 9/11). No epidural extension of tumor. Conus medullaris and cauda equina: Conus extends to the L2-3 level. Conus and cauda equina appear normal. Paraspinal and other soft tissues: Unchanged 2.7 cm left adrenal mass, again suspicious for metastasis. Disc levels: Small disc bulges at T12-L1 and L1-2. Mild adjacent segment degeneration with facet arthropathy at L2-3 resulting in mild spinal canal stenosis. IMPRESSION: 1. New enhancing lesions in the left pedicle of L1, in the left transverse process of L2 with surrounding soft tissue component, and in the junction of the left pedicle and transverse process of L3, concerning for metastatic disease. 2. Partially imaged marrow replacement with heterogeneous enhancement in the left iliac crest and right sacral ala, concerning for metastatic disease. 3. Unchanged 2.7 cm left adrenal mass,  again suspicious for metastasis. 4. Mild adjacent segment degeneration with mild spinal canal stenosis at L2-3. Electronically Signed   By: Emmit Alexanders M.D.   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 Isidore Moos. Recent MRI of the lumbar spine and PET scan showed interval disease progression including new osseous disease to the lumbar spine.   Patient has symptomatic with pain along the sacral region.  I reviewed the patient's most recent PET scan which shows significant activity along the left sacral region.  This will be consistent with her pain complaints.  She also has significant disease in the right sacral region and would recommend palliative radiation therapy to both regions.  I discussed the overall course of treatment anticipated side effects and potential toxicities of sacral radiation therapy.  She appears to understand and wishes to proceed with planned course of treatment.    PLAN: She will proceed with CT simulation later this morning.  Treatments to begin early next week.  Anticipate 10 radiation treatments directed at the upper sacrum area.   35 minutes of total time was spent for this patient encounter, including preparation, face-to-face counseling with the patient and coordination of care, physical exam, and documentation of the encounter.   ------------------------------------------------  Blair Promise, PhD, MD  This document serves as a record of services personally performed by Gery Pray, MD. It was created on his behalf by Roney Mans, a trained medical scribe. The creation of this  record is based on the scribe's personal observations and the provider's statements to them. This document has been  checked and approved by the attending provider.

## 2022-06-30 NOTE — Progress Notes (Signed)
Histology and Location of Primary Cancer: metastatic small cell carcinoma of  left lung   PET scan on 06-19-22 IMPRESSION: 1. Overall progression in extensive stage squamous cell carcinoma with new hypermetabolic mediastinal adenopathy, progressive hypermetabolic pleural nodularity in the left hemithorax, enlarging hypermetabolic gastrohepatic ligament lymph node and new/increasing the hypermetabolic osseous lesions. Right hepatic lobe mass and left adrenal nodule have increased in size but decreased in hypermetabolism. 2. Small pericardial effusion.  Location(s) of Symptomatic tumor(s): spinal mets MRI spine on 06-03-22 IMPRESSION: 1. New enhancing lesions in the left pedicle of L1, in the left transverse process of L2 with surrounding soft tissue component, and in the junction of the left pedicle and transverse process of L3, concerning for metastatic disease. 2. Partially imaged marrow replacement with heterogeneous enhancement in the left iliac crest and right sacral ala, concerning for metastatic disease. 3. Unchanged 2.7 cm left adrenal mass, again suspicious for metastasis. 4. Mild adjacent segment degeneration with mild spinal canal stenosis at L2-3.   Past/Anticipated chemotherapy by medical oncology, if any:  Dr. Marin Olp on 06-29-22 She still having some back discomfort. We will have to see if Radiation Oncology can see her and see about palliative radiotherapy to her back. I will also see about giving her some Zometa.  She has extensive stage small cell lung cancer.  She has been through 2 different lines of treatment.  We really have very little options left.  She really needs to improve her performance status before we would treat her.   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   We will give her a dose of Zometa today.  Patient's main complaints related to symptomatic tumor(s) are: back pain per Dr. Antonieta Pert note  Pain on a  scale of 0-10 is: Lower back pain, 8.5/10.  She is taking Oxycodone.    If Spine Met(s), symptoms, if any, include: Bowel/Bladder retention or incontinence (please describe): She reports constipation, taking laxatives and stool softeners. Numbness or weakness in extremities (please describe): Denies numbness or weakness.   Ambulatory status? Walker? Wheelchair?: Ambulatory  SAFETY ISSUES: Prior radiation? Yes, SRS treatment on 04-29-22 for brain mets  Pacemaker/ICD? No Possible current pregnancy? no Is the patient on methotrexate? no  Additional Complaints / other details:  Recently d/c from hospital on 06-29-22.

## 2022-07-01 ENCOUNTER — Ambulatory Visit
Admission: RE | Admit: 2022-07-01 | Discharge: 2022-07-01 | Disposition: A | Discharge status: Home / Self Care | Payer: PPO | Source: Ambulatory Visit | Attending: Radiation Oncology | Admitting: Radiation Oncology

## 2022-07-01 ENCOUNTER — Encounter: Payer: Self-pay | Admitting: Radiation Oncology

## 2022-07-01 ENCOUNTER — Other Ambulatory Visit: Payer: Self-pay

## 2022-07-01 VITALS — BP 130/87 | HR 103 | Temp 97.1°F | Resp 20 | Ht 59.0 in | Wt 115.1 lb

## 2022-07-01 DIAGNOSIS — Z794 Long term (current) use of insulin: Secondary | ICD-10-CM | POA: Diagnosis not present

## 2022-07-01 DIAGNOSIS — I3139 Other pericardial effusion (noninflammatory): Secondary | ICD-10-CM | POA: Diagnosis not present

## 2022-07-01 DIAGNOSIS — C7889 Secondary malignant neoplasm of other digestive organs: Secondary | ICD-10-CM | POA: Diagnosis not present

## 2022-07-01 DIAGNOSIS — C7931 Secondary malignant neoplasm of brain: Secondary | ICD-10-CM | POA: Diagnosis not present

## 2022-07-01 DIAGNOSIS — C787 Secondary malignant neoplasm of liver and intrahepatic bile duct: Secondary | ICD-10-CM | POA: Diagnosis not present

## 2022-07-01 DIAGNOSIS — C3412 Malignant neoplasm of upper lobe, left bronchus or lung: Secondary | ICD-10-CM | POA: Insufficient documentation

## 2022-07-01 DIAGNOSIS — Z79890 Hormone replacement therapy (postmenopausal): Secondary | ICD-10-CM | POA: Diagnosis not present

## 2022-07-01 DIAGNOSIS — Z7984 Long term (current) use of oral hypoglycemic drugs: Secondary | ICD-10-CM | POA: Diagnosis not present

## 2022-07-01 DIAGNOSIS — J449 Chronic obstructive pulmonary disease, unspecified: Secondary | ICD-10-CM | POA: Diagnosis not present

## 2022-07-01 DIAGNOSIS — Z87891 Personal history of nicotine dependence: Secondary | ICD-10-CM | POA: Diagnosis not present

## 2022-07-01 DIAGNOSIS — R519 Headache, unspecified: Secondary | ICD-10-CM | POA: Diagnosis not present

## 2022-07-01 DIAGNOSIS — C7951 Secondary malignant neoplasm of bone: Secondary | ICD-10-CM

## 2022-07-01 DIAGNOSIS — E039 Hypothyroidism, unspecified: Secondary | ICD-10-CM | POA: Diagnosis not present

## 2022-07-01 DIAGNOSIS — Z7985 Long-term (current) use of injectable non-insulin antidiabetic drugs: Secondary | ICD-10-CM | POA: Diagnosis not present

## 2022-07-01 DIAGNOSIS — Z79899 Other long term (current) drug therapy: Secondary | ICD-10-CM | POA: Diagnosis not present

## 2022-07-01 DIAGNOSIS — Z791 Long term (current) use of non-steroidal anti-inflammatories (NSAID): Secondary | ICD-10-CM | POA: Insufficient documentation

## 2022-07-01 DIAGNOSIS — Z7901 Long term (current) use of anticoagulants: Secondary | ICD-10-CM | POA: Diagnosis not present

## 2022-07-01 DIAGNOSIS — E119 Type 2 diabetes mellitus without complications: Secondary | ICD-10-CM | POA: Diagnosis not present

## 2022-07-01 DIAGNOSIS — I10 Essential (primary) hypertension: Secondary | ICD-10-CM | POA: Insufficient documentation

## 2022-07-01 LAB — CULTURE, BLOOD (ROUTINE X 2)
Culture: NO GROWTH
Culture: NO GROWTH
Special Requests: ADEQUATE
Special Requests: ADEQUATE

## 2022-07-02 ENCOUNTER — Ambulatory Visit (INDEPENDENT_AMBULATORY_CARE_PROVIDER_SITE_OTHER): Payer: PPO | Admitting: Family Medicine

## 2022-07-02 ENCOUNTER — Encounter: Payer: Self-pay | Admitting: Family Medicine

## 2022-07-02 VITALS — BP 138/88 | HR 90 | Temp 98.0°F | Resp 18 | Ht 59.0 in | Wt 116.8 lb

## 2022-07-02 DIAGNOSIS — C778 Secondary and unspecified malignant neoplasm of lymph nodes of multiple regions: Secondary | ICD-10-CM

## 2022-07-02 DIAGNOSIS — J189 Pneumonia, unspecified organism: Secondary | ICD-10-CM

## 2022-07-02 DIAGNOSIS — Z794 Long term (current) use of insulin: Secondary | ICD-10-CM

## 2022-07-02 DIAGNOSIS — E039 Hypothyroidism, unspecified: Secondary | ICD-10-CM | POA: Diagnosis not present

## 2022-07-02 DIAGNOSIS — C787 Secondary malignant neoplasm of liver and intrahepatic bile duct: Secondary | ICD-10-CM

## 2022-07-02 DIAGNOSIS — E1169 Type 2 diabetes mellitus with other specified complication: Secondary | ICD-10-CM

## 2022-07-02 DIAGNOSIS — E785 Hyperlipidemia, unspecified: Secondary | ICD-10-CM | POA: Diagnosis not present

## 2022-07-02 DIAGNOSIS — E1151 Type 2 diabetes mellitus with diabetic peripheral angiopathy without gangrene: Secondary | ICD-10-CM | POA: Diagnosis not present

## 2022-07-02 DIAGNOSIS — E119 Type 2 diabetes mellitus without complications: Secondary | ICD-10-CM

## 2022-07-02 DIAGNOSIS — C3490 Malignant neoplasm of unspecified part of unspecified bronchus or lung: Secondary | ICD-10-CM | POA: Diagnosis not present

## 2022-07-02 LAB — COMPREHENSIVE METABOLIC PANEL
ALT: 13 U/L (ref 0–35)
AST: 23 U/L (ref 0–37)
Albumin: 3.5 g/dL (ref 3.5–5.2)
Alkaline Phosphatase: 55 U/L (ref 39–117)
BUN: 14 mg/dL (ref 6–23)
CO2: 29 mEq/L (ref 19–32)
Calcium: 8.6 mg/dL (ref 8.4–10.5)
Chloride: 100 mEq/L (ref 96–112)
Creatinine, Ser: 0.63 mg/dL (ref 0.40–1.20)
GFR: 88.89 mL/min (ref >60.00)
Glucose, Bld: 221 mg/dL — ABNORMAL HIGH (ref 70–99)
Potassium: 4.7 mEq/L (ref 3.5–5.1)
Sodium: 136 mEq/L (ref 135–145)
Total Bilirubin: 0.3 mg/dL (ref 0.2–1.2)
Total Protein: 6.6 g/dL (ref 6.0–8.3)

## 2022-07-02 LAB — LIPID PANEL
Cholesterol: 142 mg/dL (ref 0–200)
HDL: 34.4 mg/dL — ABNORMAL LOW (ref >39.00)
LDL Cholesterol: 82 mg/dL (ref 0–99)
NonHDL: 108.02
Total CHOL/HDL Ratio: 4
Triglycerides: 132 mg/dL (ref 0.0–149.0)
VLDL: 26.4 mg/dL (ref 0.0–40.0)

## 2022-07-02 LAB — CBC WITH DIFFERENTIAL/PLATELET
Basophils Absolute: 0 10*3/uL (ref 0.0–0.1)
Basophils Relative: 0.4 % (ref 0.0–3.0)
Eosinophils Absolute: 0.1 10*3/uL (ref 0.0–0.7)
Eosinophils Relative: 2.1 % (ref 0.0–5.0)
HCT: 31 % — ABNORMAL LOW (ref 36.0–46.0)
Hemoglobin: 10.5 g/dL — ABNORMAL LOW (ref 12.0–15.0)
Lymphocytes Relative: 15.7 % (ref 12.0–46.0)
Lymphs Abs: 0.8 10*3/uL (ref 0.7–4.0)
MCHC: 33.7 g/dL (ref 30.0–36.0)
MCV: 94.1 fl (ref 78.0–100.0)
Monocytes Absolute: 0.6 10*3/uL (ref 0.1–1.0)
Monocytes Relative: 11.7 % (ref 3.0–12.0)
Neutro Abs: 3.8 10*3/uL (ref 1.4–7.7)
Neutrophils Relative %: 70.1 % (ref 43.0–77.0)
Platelets: 237 10*3/uL (ref 150.0–400.0)
RBC: 3.29 Mil/uL — ABNORMAL LOW (ref 3.87–5.11)
RDW: 13.9 % (ref 11.5–15.5)
WBC: 5.4 10*3/uL (ref 4.0–10.5)

## 2022-07-02 LAB — HEMOGLOBIN A1C: Hgb A1c MFr Bld: 8.3 % — ABNORMAL HIGH (ref 4.6–6.5)

## 2022-07-02 LAB — MICROALBUMIN / CREATININE URINE RATIO
Creatinine,U: 12.9 mg/dL
Microalb Creat Ratio: 5.4 mg/g (ref 0.0–30.0)
Microalb, Ur: <0.7 mg/dL (ref 0.0–1.9)

## 2022-07-02 MED ORDER — APIXABAN 5 MG PO TABS: 5.0000 mg | ORAL_TABLET | Freq: Two times a day (BID) | ORAL | 2 refills | Status: DC

## 2022-07-02 NOTE — Assessment & Plan Note (Signed)
Tolerating statin, encouraged heart healthy diet, avoid trans fats, minimize simple carbs and saturated fats. Increase exercise as tolerated 

## 2022-07-02 NOTE — Assessment & Plan Note (Signed)
Check tsh 

## 2022-07-02 NOTE — Progress Notes (Addendum)
Subjective:   By signing my name below, I, Shehryar Baig, attest that this documentation has been prepared under the direction and in the presence of [name], DO. 07/02/2022   Patient ID: [name], female    DOB: Nov 22, 1950, 72 y.o.   MRN: [id number]  Chief Complaint  Patient presents with   Hospitalization Follow-up    Pneumonia follow up     HPI Patient is in today for a hospital follow up.   She was admitted to the hospital on 06/26/2022 for left lobe pneumonia and discharged on 06/29/2022. She was given Cefdinir and midodrine. She was given midodrine to help manage her blood pressure.  Her symptoms have improved since her hospital visit. Her blood sugars are measuring high but patient notes they were measuring high prior to her hospitalization.  Her oncologist discontinued her chemotherapy in her lungs due to no change and instead switched her to radiation therapy for 2 weeks. She continues following up with her oncologist regularly.    Past Medical History:  Diagnosis Date   Arthritis    Cancer ([place])    Diabetes mellitus    Esophagitis    Goals of care, counseling/discussion 05/23/2021   Hiatal hernia    History of radiation therapy    Left Lung- 11/27/21-01/08/22- Dr. 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[place]) 06/06/2021   Small cell lung cancer, left upper lobe ([place]) 06/06/2021   Thyroid disease    TMJ (sprain of temporomandibular joint)     Past Surgical History:  Procedure Laterality Date   ABDOMINAL HYSTERECTOMY     BACK SURGERY     x3   BACK SURGERY     x 5 , 2 neck surgeries, 2 lower back surgeries   BRONCHIAL BIOPSY  06/02/2021   Procedure: BRONCHIAL BIOPSIES;  Surgeon: Collene Gobble, MD;  Location: [place];  Service: Pulmonary;;   BRONCHIAL BRUSHINGS  06/02/2021   Procedure: BRONCHIAL BRUSHINGS;  Surgeon: Collene Gobble, MD;  Location: [facility] ENDOSCOPY;  Service: Pulmonary;;   BRONCHIAL NEEDLE ASPIRATION BIOPSY  06/02/2021   Procedure: BRONCHIAL NEEDLE ASPIRATION BIOPSIES;  Surgeon: Collene Gobble, MD;  Location: [facility] ENDOSCOPY;  Service: Pulmonary;;   CHOLECYSTECTOMY     COLONOSCOPY     ERCP     ESOPHAGOGASTRODUODENOSCOPY     IR IMAGING GUIDED PORT INSERTION  06/13/2021   THYROID SURGERY     TONSILECTOMY, ADENOIDECTOMY, BILATERAL MYRINGOTOMY AND TUBES     VIDEO BRONCHOSCOPY WITH ENDOBRONCHIAL ULTRASOUND Left 06/02/2021   Procedure: VIDEO BRONCHOSCOPY WITH ENDOBRONCHIAL ULTRASOUND;  Surgeon: Collene Gobble, MD;  Location: [facility] ENDOSCOPY;  Service: Pulmonary;  Laterality: Left;    Family History  Problem Relation Age of Onset   Stroke Mother    Alcohol abuse Mother    Cancer Brother        leukemia   Stroke Maternal Grandmother    Heart disease Maternal Grandfather    Cancer Daughter        breast cancer   Breast cancer Daughter    Breast cancer Cousin    Colon cancer Neg Hx    Esophageal cancer Neg Hx    Rectal cancer Neg Hx  Stomach cancer Neg Hx     Social History   Socioeconomic History   Marital status: Married    Spouse name: Not on file   Number of children: Not on file   Years of education: Not on file   Highest education level: Not on file  Occupational History   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    Employer: [employer]  Tobacco Use   Smoking status: Former    Packs/day: 1.00    Years: 48.00    Additional pack years: 0.00    Total pack years: 48.00    Types: Cigarettes    Quit date: 02/01/2017    Years since quitting: 5.4   Smokeless tobacco: Never  Vaping Use   Vaping Use: Never used  Substance and Sexual Activity   Alcohol use: Never   Drug use: No   Sexual activity: Not Currently    Partners: Male    Birth control/protection: Surgical  Other  Topics Concern   Not on file  Social History Narrative   Exercise--  3 flts steps a day for 8 hours   Social Determinants of Health   Financial Resource Strain: Low Risk  (06/29/2019)   Overall Financial Resource Strain (CARDIA)    Difficulty of Paying Living Expenses: Not hard at all  Food Insecurity: No Food Insecurity (07/01/2022)   Hunger Vital Sign    Worried About Running Out of Food in the Last Year: Never true    [place] in the Last Year: Never true  Transportation Needs: No Transportation Needs (07/01/2022)   PRAPARE - Hydrologist (Medical): No    Lack of Transportation (Non-Medical): No  Physical Activity: Not on file  Stress: Not on file  Social Connections: Not on file  Intimate Partner Violence: Not At Risk (07/01/2022)   Humiliation, Afraid, Rape, and Kick questionnaire    Fear of Current or Ex-Partner: No    Emotionally Abused: No    Physically Abused: No    Sexually Abused: No    Outpatient Medications Prior to Visit  Medication Sig Dispense Refill   acetaminophen (TYLENOL) 325 MG tablet Take 1 tablet (325 mg total) by mouth every 6 (six) hours as needed for moderate pain.     albuterol (VENTOLIN HFA) 108 (90 Base) MCG/ACT inhaler Inhale 2 puffs into the lungs every 6 (six) hours as needed for wheezing or shortness of breath. 8 g 2   ALPRAZolam (XANAX) 0.5 MG tablet Take 1 tablet (0.5 mg total) by mouth 3 (three) times daily as needed. 90 tablet 1   blood glucose meter kit and supplies KIT Use daily 1 each 0   Blood Glucose Monitoring Suppl (ONE TOUCH ULTRA 2) w/Device KIT Check blood sugar as directed. Dx:E11.65 1 kit 0   Blood Glucose Monitoring Suppl (ONETOUCH VERIO FLEX SYSTEM) w/Device KIT USE AS DIRECTED TO CHECK BLOOD SUGAR FOUR TIMES A DAY (Patient taking differently: 1 each by Other route 4 (four) times daily.) 1 kit 0   cefdinir (OMNICEF) 300 MG capsule Take 1 capsule (300 mg total) by mouth 2 (two) times daily for 4  days. 8 capsule 0   FLUoxetine (PROZAC) 20 MG capsule Take 1 capsule (20 mg total) by mouth daily. 90 capsule 3   fluticasone (FLONASE) 50 MCG/ACT nasal spray Place 1 spray into both nostrils daily. 11.1 mL 1   glucose blood (ONETOUCH VERIO) test strip USE ONE STRIP TO TEST DAILY (Patient taking differently: 1 each by  Other route daily.) 100 strip 1   guaiFENesin (MUCINEX) 600 MG 12 hr tablet Take 1 tablet (600 mg total) by mouth 2 (two) times daily for 7 days. 14 tablet 0   insulin glargine (LANTUS SOLOSTAR) 100 UNIT/ML Solostar Pen Inject 30 Units into the skin daily. HS (Patient taking differently: Inject 30 Units into the skin daily.)     Insulin Pen Needle (NOVOTWIST) 32G X 5 MM MISC Use daily at Bedtime with Lantus 100 each 0   ipratropium-albuterol (DUONEB) 0.5-2.5 (3) MG/3ML SOLN Take 3 mLs by nebulization every 6 (six) hours as needed. 360 mL 3   levothyroxine (SYNTHROID) 125 MCG tablet Take 1 tablet (125 mcg total) by mouth daily before breakfast. 90 tablet 3   lidocaine-prilocaine (EMLA) cream Apply 1 Application topically as needed. 30 g 0   meloxicam (MOBIC) 15 MG tablet TAKE 1 TABLET BY MOUTH DAILY 30 tablet 2   metFORMIN (GLUCOPHAGE-XR) 500 MG 24 hr tablet Take 1 tablet (500 mg total) by mouth in the morning and at bedtime. 180 tablet 0   midodrine (PROAMATINE) 5 MG tablet Take 1 tablet (5 mg total) by mouth 3 (three) times daily with meals. 90 tablet 0   omeprazole (PRILOSEC) 20 MG capsule Take 1 capsule (20 mg total) by mouth daily. (Patient taking differently: Take 20 mg by mouth at bedtime.) 90 capsule 3   oxyCODONE-acetaminophen (PERCOCET) 7.5-325 MG tablet Take 1 tablet by mouth every 6 (six) hours as needed for severe pain. 90 tablet 0   TRULICITY [id number] [id number] SOPN Inject 0.75 mg into the skin every Saturday.     valACYclovir (VALTREX) 1000 MG tablet TAKE ONE TABLET BY MOUTH DAILY AS NEEDED (Patient taking differently: Take 1,000 mg by mouth daily as needed (breakout).) 30  tablet 0   apixaban (ELIQUIS) 5 MG TABS tablet Take 1 tablet (5 mg total) by mouth 2 (two) times daily. 60 tablet 0   Facility-Administered Medications Prior to Visit  Medication Dose Route Frequency Provider Last Rate Last Admin   sodium chloride flush (NS) 0.9 % injection 10 mL  10 mL Intravenous PRN Celso Amy, NP   10 mL at 08/18/21 [id number]    Allergies  Allergen Reactions   Hydrocodone-Acetaminophen Other (See Comments)    Nightmares, skin crawling   Lyrica [Pregabalin] Other (See Comments)    Hallucinations    Crestor [Rosuvastatin Calcium] Other (See Comments)    Leg cramps    Review of Systems  Constitutional:  Negative for fever and malaise/fatigue.  HENT:  Negative for congestion.   Eyes:  Negative for blurred vision.  Respiratory:  Negative for shortness of breath.   Cardiovascular:  Negative for chest pain, palpitations and leg swelling.  Gastrointestinal:  Negative for abdominal pain, blood in stool and nausea.  Genitourinary:  Negative for dysuria and frequency.  Musculoskeletal:  Negative for falls.  Skin:  Negative for rash.  Neurological:  Negative for dizziness, loss of consciousness and headaches.  Endo/Heme/Allergies:  Negative for environmental allergies.  Psychiatric/Behavioral:  Negative for depression. The patient is not nervous/anxious.        Objective:    Physical Exam Vitals and nursing note reviewed.  Constitutional:      Appearance: She is well-developed. She is not diaphoretic.  HENT:     Head: Normocephalic and atraumatic.     Nose: Mucosal edema present. No nasal deformity or rhinorrhea.     Right Sinus: Maxillary sinus tenderness and frontal sinus tenderness present.  Left Sinus: Maxillary sinus tenderness and frontal sinus tenderness present.     Mouth/Throat:     Pharynx: No oropharyngeal exudate.  Eyes:     Conjunctiva/sclera: Conjunctivae normal.  Neck:     Thyroid: No thyromegaly.     Vascular: No carotid bruit or JVD.   Cardiovascular:     Rate and Rhythm: Normal rate and regular rhythm.     Heart sounds: Normal heart sounds. No murmur heard. Pulmonary:     Effort: Pulmonary effort is normal. No respiratory distress.     Breath sounds: Normal breath sounds. No wheezing or rales.  Chest:     Chest wall: No tenderness.  Musculoskeletal:     Cervical back: Normal range of motion and neck supple.  Lymphadenopathy:     Cervical: No cervical adenopathy.  Skin:    General: Skin is warm.  Neurological:     Mental Status: She is alert and oriented to person, place, and time.     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 Increase exercise as tolerated   Orders: -     CBC with Differential/Platelet -     Comprehensive metabolic panel -     Hemoglobin A1c  Hypothyroidism, unspecified type Assessment & Plan: Check tsh  Orders: -     CBC with Differential/Platelet -     Comprehensive metabolic panel -     Hemoglobin A1c  Hyperlipidemia associated with type 2 diabetes mellitus ([place]) Assessment & Plan: Tolerating statin, encouraged heart healthy diet, avoid trans fats, minimize simple carbs and saturated fats. Increase exercise as tolerated    Orders: -     Lipid panel  Pneumonia of left lung due to infectious organism, unspecified part of lung -     CBC with Differential/Platelet -     Comprehensive metabolic panel  Type 2 diabetes mellitus with diabetic peripheral angiopathy without gangrene, without long-term current use of insulin (HCC) Assessment & Plan: hgba1c to be checked , minimize simple carbs. Increase exercise as tolerated. Continue current meds    Diabetes mellitus, type II, insulin dependent ([place]) Assessment & Plan: hgba1c to be checked , minimize simple carbs. Increase exercise as tolerated. 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  Dispense: 60 tablet; Refill: 2    I, [name], DO, personally preformed the services described  in this documentation.  All medical record entries made by the scribe were at my direction and in my presence.  I have reviewed the chart and discharge instructions (if applicable) and agree that the record reflects my personal performance and is accurate and complete. 07/02/2022   I,Shehryar Baig,acting as a scribe for [name], DO.,have documented all relevant documentation on the behalf of [name], DO,as directed by  [name], DO while in the presence of [name], DO.   [name], DO

## 2022-07-02 NOTE — Assessment & Plan Note (Signed)
hgba1c to be checked, minimize simple carbs. Increase exercise as tolerated. Continue current meds  

## 2022-07-02 NOTE — Assessment & Plan Note (Signed)
Pt doing better  Will finish abx today

## 2022-07-02 NOTE — Assessment & Plan Note (Signed)
Per onc  Pt getting radiation

## 2022-07-05 DIAGNOSIS — Z87891 Personal history of nicotine dependence: Secondary | ICD-10-CM | POA: Diagnosis not present

## 2022-07-05 DIAGNOSIS — C3412 Malignant neoplasm of upper lobe, left bronchus or lung: Secondary | ICD-10-CM | POA: Diagnosis not present

## 2022-07-05 DIAGNOSIS — C7931 Secondary malignant neoplasm of brain: Secondary | ICD-10-CM | POA: Diagnosis not present

## 2022-07-05 DIAGNOSIS — C7951 Secondary malignant neoplasm of bone: Secondary | ICD-10-CM | POA: Diagnosis not present

## 2022-07-06 ENCOUNTER — Other Ambulatory Visit: Payer: Self-pay | Admitting: Family Medicine

## 2022-07-06 ENCOUNTER — Ambulatory Visit
Admission: RE | Admit: 2022-07-06 | Discharge: 2022-07-06 | Disposition: A | Discharge status: Home / Self Care | Payer: PPO | Source: Ambulatory Visit | Attending: Radiation Oncology | Admitting: Radiation Oncology

## 2022-07-06 ENCOUNTER — Other Ambulatory Visit: Payer: Self-pay

## 2022-07-06 ENCOUNTER — Telehealth: Payer: Self-pay | Admitting: Nurse Practitioner

## 2022-07-06 DIAGNOSIS — C7951 Secondary malignant neoplasm of bone: Secondary | ICD-10-CM | POA: Diagnosis not present

## 2022-07-06 DIAGNOSIS — G4734 Idiopathic sleep related nonobstructive alveolar hypoventilation: Secondary | ICD-10-CM

## 2022-07-06 DIAGNOSIS — G4733 Obstructive sleep apnea (adult) (pediatric): Secondary | ICD-10-CM

## 2022-07-06 DIAGNOSIS — Z87891 Personal history of nicotine dependence: Secondary | ICD-10-CM | POA: Diagnosis not present

## 2022-07-06 DIAGNOSIS — C3490 Malignant neoplasm of unspecified part of unspecified bronchus or lung: Secondary | ICD-10-CM

## 2022-07-06 DIAGNOSIS — C3412 Malignant neoplasm of upper lobe, left bronchus or lung: Secondary | ICD-10-CM | POA: Diagnosis not present

## 2022-07-06 DIAGNOSIS — C7931 Secondary malignant neoplasm of brain: Secondary | ICD-10-CM | POA: Diagnosis not present

## 2022-07-06 DIAGNOSIS — F418 Other specified anxiety disorders: Secondary | ICD-10-CM

## 2022-07-06 DIAGNOSIS — Z510 Encounter for antineoplastic radiation therapy: Secondary | ICD-10-CM | POA: Diagnosis not present

## 2022-07-06 LAB — RAD ONC ARIA SESSION SUMMARY
Course Elapsed Days: 0
Plan Fractions Treated to Date: 1
Plan Prescribed Dose Per Fraction: 3 Gy
Plan Total Fractions Prescribed: 10
Plan Total Prescribed Dose: 30 Gy
Reference Point Dosage Given to Date: 3 Gy
Reference Point Session Dosage Given: 3 Gy
Session Number: 1

## 2022-07-06 MED ORDER — OXYCODONE-ACETAMINOPHEN 7.5-325 MG PO TABS: 1.0000 | ORAL_TABLET | Freq: Four times a day (QID) | ORAL | 0 refills | Status: DC | PRN

## 2022-07-06 NOTE — Telephone Encounter (Signed)
Pt calling for a refill and to ask when she needs to follow up with Dr. Marin Olp. Pt aware that Dr. Marin Olp would like to see her after she completes radiation. Pt verbalized understanding and had no further questions.

## 2022-07-07 ENCOUNTER — Encounter: Payer: Self-pay | Admitting: *Deleted

## 2022-07-07 ENCOUNTER — Telehealth: Payer: Self-pay | Admitting: *Deleted

## 2022-07-07 ENCOUNTER — Other Ambulatory Visit: Payer: Self-pay

## 2022-07-07 ENCOUNTER — Ambulatory Visit
Admission: RE | Admit: 2022-07-07 | Discharge: 2022-07-07 | Disposition: A | Discharge status: Home / Self Care | Payer: PPO | Source: Ambulatory Visit | Attending: Radiation Oncology | Admitting: Radiation Oncology

## 2022-07-07 DIAGNOSIS — Z87891 Personal history of nicotine dependence: Secondary | ICD-10-CM | POA: Diagnosis not present

## 2022-07-07 DIAGNOSIS — C7951 Secondary malignant neoplasm of bone: Secondary | ICD-10-CM | POA: Diagnosis not present

## 2022-07-07 DIAGNOSIS — C3412 Malignant neoplasm of upper lobe, left bronchus or lung: Secondary | ICD-10-CM | POA: Diagnosis not present

## 2022-07-07 DIAGNOSIS — Z510 Encounter for antineoplastic radiation therapy: Secondary | ICD-10-CM | POA: Diagnosis not present

## 2022-07-07 DIAGNOSIS — C7931 Secondary malignant neoplasm of brain: Secondary | ICD-10-CM | POA: Diagnosis not present

## 2022-07-07 LAB — RAD ONC ARIA SESSION SUMMARY
Course Elapsed Days: 1
Plan Fractions Treated to Date: 2
Plan Prescribed Dose Per Fraction: 3 Gy
Plan Total Fractions Prescribed: 10
Plan Total Prescribed Dose: 30 Gy
Reference Point Dosage Given to Date: 6 Gy
Reference Point Session Dosage Given: 3 Gy
Session Number: 2

## 2022-07-07 NOTE — Telephone Encounter (Signed)
Called and spoke w/ pt she verbalized understanding. CPAP titration has been ordered and someone will contact pt to schedule

## 2022-07-07 NOTE — Telephone Encounter (Signed)
Per scheduling message Roselyn Reef - called patient and lvm of upcoming appointments for hospital follow up - requested call back to confirm.

## 2022-07-07 NOTE — Progress Notes (Signed)
Patient discharged from the hospital last week. She will follow up with radiation oncology for radiation. Her last treatment is 07/17/2022. Dr Marin Olp would like to see patient after her rxt ends. Message sent to scheduling.   Oncology Nurse Navigator Documentation     07/07/2022    9:15 AM  Oncology Nurse Navigator Flowsheets  Navigator Follow Up Date: 07/24/2022  Navigator Follow Up Reason: Follow-up Appointment;Chemotherapy  Navigator Location [facility]  Navigator Encounter Type Appt/Treatment Plan Review  Patient Visit Type MedOnc  Treatment Phase Active Tx  Barriers/Navigation Needs Coordination of Care;Education  Interventions Coordination of Care  Acuity Level 2-Minimal Needs (1-2 Barriers Identified)  Coordination of Care Appts  Support Groups/Services Friends and Family  Time Spent with Patient 15

## 2022-07-08 ENCOUNTER — Other Ambulatory Visit: Payer: Self-pay

## 2022-07-08 ENCOUNTER — Ambulatory Visit
Admission: RE | Admit: 2022-07-08 | Discharge: 2022-07-08 | Disposition: A | Discharge status: Home / Self Care | Payer: PPO | Source: Ambulatory Visit | Attending: Radiation Oncology | Admitting: Radiation Oncology

## 2022-07-08 DIAGNOSIS — C7931 Secondary malignant neoplasm of brain: Secondary | ICD-10-CM | POA: Diagnosis not present

## 2022-07-08 DIAGNOSIS — Z87891 Personal history of nicotine dependence: Secondary | ICD-10-CM | POA: Diagnosis not present

## 2022-07-08 DIAGNOSIS — Z510 Encounter for antineoplastic radiation therapy: Secondary | ICD-10-CM | POA: Diagnosis not present

## 2022-07-08 DIAGNOSIS — C7951 Secondary malignant neoplasm of bone: Secondary | ICD-10-CM | POA: Diagnosis not present

## 2022-07-08 DIAGNOSIS — C3412 Malignant neoplasm of upper lobe, left bronchus or lung: Secondary | ICD-10-CM | POA: Diagnosis not present

## 2022-07-08 LAB — RAD ONC ARIA SESSION SUMMARY
Course Elapsed Days: 2
Plan Fractions Treated to Date: 3
Plan Prescribed Dose Per Fraction: 3 Gy
Plan Total Fractions Prescribed: 10
Plan Total Prescribed Dose: 30 Gy
Reference Point Dosage Given to Date: 9 Gy
Reference Point Session Dosage Given: 3 Gy
Session Number: 3

## 2022-07-09 ENCOUNTER — Other Ambulatory Visit: Payer: Self-pay

## 2022-07-09 ENCOUNTER — Ambulatory Visit
Admission: RE | Admit: 2022-07-09 | Discharge: 2022-07-09 | Disposition: A | Discharge status: Home / Self Care | Payer: PPO | Source: Ambulatory Visit | Attending: Radiation Oncology | Admitting: Radiation Oncology

## 2022-07-09 DIAGNOSIS — C7951 Secondary malignant neoplasm of bone: Secondary | ICD-10-CM | POA: Diagnosis not present

## 2022-07-09 DIAGNOSIS — Z87891 Personal history of nicotine dependence: Secondary | ICD-10-CM | POA: Diagnosis not present

## 2022-07-09 DIAGNOSIS — C3412 Malignant neoplasm of upper lobe, left bronchus or lung: Secondary | ICD-10-CM | POA: Diagnosis not present

## 2022-07-09 DIAGNOSIS — Z510 Encounter for antineoplastic radiation therapy: Secondary | ICD-10-CM | POA: Diagnosis not present

## 2022-07-09 DIAGNOSIS — C7931 Secondary malignant neoplasm of brain: Secondary | ICD-10-CM | POA: Diagnosis not present

## 2022-07-09 LAB — RAD ONC ARIA SESSION SUMMARY
Course Elapsed Days: 3
Plan Fractions Treated to Date: 4
Plan Prescribed Dose Per Fraction: 3 Gy
Plan Total Fractions Prescribed: 10
Plan Total Prescribed Dose: 30 Gy
Reference Point Dosage Given to Date: 12 Gy
Reference Point Session Dosage Given: 3 Gy
Session Number: 4

## 2022-07-10 ENCOUNTER — Other Ambulatory Visit: Payer: Self-pay

## 2022-07-10 ENCOUNTER — Ambulatory Visit
Admission: RE | Admit: 2022-07-10 | Discharge: 2022-07-10 | Disposition: A | Discharge status: Home / Self Care | Payer: PPO | Source: Ambulatory Visit | Attending: Radiation Oncology | Admitting: Radiation Oncology

## 2022-07-10 DIAGNOSIS — C7951 Secondary malignant neoplasm of bone: Secondary | ICD-10-CM | POA: Diagnosis not present

## 2022-07-10 DIAGNOSIS — C7931 Secondary malignant neoplasm of brain: Secondary | ICD-10-CM | POA: Diagnosis not present

## 2022-07-10 DIAGNOSIS — Z510 Encounter for antineoplastic radiation therapy: Secondary | ICD-10-CM | POA: Diagnosis not present

## 2022-07-10 DIAGNOSIS — Z87891 Personal history of nicotine dependence: Secondary | ICD-10-CM | POA: Diagnosis not present

## 2022-07-10 DIAGNOSIS — C3412 Malignant neoplasm of upper lobe, left bronchus or lung: Secondary | ICD-10-CM | POA: Diagnosis not present

## 2022-07-10 LAB — RAD ONC ARIA SESSION SUMMARY
Course Elapsed Days: 4
Plan Fractions Treated to Date: 5
Plan Prescribed Dose Per Fraction: 3 Gy
Plan Total Fractions Prescribed: 10
Plan Total Prescribed Dose: 30 Gy
Reference Point Dosage Given to Date: 15 Gy
Reference Point Session Dosage Given: 3 Gy
Session Number: 5

## 2022-07-11 ENCOUNTER — Other Ambulatory Visit: Payer: Self-pay

## 2022-07-13 ENCOUNTER — Other Ambulatory Visit: Payer: Self-pay

## 2022-07-13 ENCOUNTER — Ambulatory Visit
Admission: RE | Admit: 2022-07-13 | Discharge: 2022-07-13 | Disposition: A | Discharge status: Home / Self Care | Payer: PPO | Source: Ambulatory Visit | Attending: Radiation Oncology | Admitting: Radiation Oncology

## 2022-07-13 DIAGNOSIS — Z510 Encounter for antineoplastic radiation therapy: Secondary | ICD-10-CM | POA: Diagnosis not present

## 2022-07-13 DIAGNOSIS — Z87891 Personal history of nicotine dependence: Secondary | ICD-10-CM | POA: Diagnosis not present

## 2022-07-13 DIAGNOSIS — C7951 Secondary malignant neoplasm of bone: Secondary | ICD-10-CM | POA: Diagnosis not present

## 2022-07-13 DIAGNOSIS — C3412 Malignant neoplasm of upper lobe, left bronchus or lung: Secondary | ICD-10-CM | POA: Diagnosis not present

## 2022-07-13 DIAGNOSIS — C7931 Secondary malignant neoplasm of brain: Secondary | ICD-10-CM | POA: Diagnosis not present

## 2022-07-13 LAB — RAD ONC ARIA SESSION SUMMARY
Course Elapsed Days: 7
Plan Fractions Treated to Date: 6
Plan Prescribed Dose Per Fraction: 3 Gy
Plan Total Fractions Prescribed: 10
Plan Total Prescribed Dose: 30 Gy
Reference Point Dosage Given to Date: 18 Gy
Reference Point Session Dosage Given: 3 Gy
Session Number: 6

## 2022-07-14 ENCOUNTER — Other Ambulatory Visit: Payer: Self-pay

## 2022-07-14 ENCOUNTER — Ambulatory Visit
Admission: RE | Admit: 2022-07-14 | Discharge: 2022-07-14 | Disposition: A | Discharge status: Home / Self Care | Payer: PPO | Source: Ambulatory Visit | Attending: Radiation Oncology | Admitting: Radiation Oncology

## 2022-07-14 DIAGNOSIS — Z87891 Personal history of nicotine dependence: Secondary | ICD-10-CM | POA: Diagnosis not present

## 2022-07-14 DIAGNOSIS — C3412 Malignant neoplasm of upper lobe, left bronchus or lung: Secondary | ICD-10-CM | POA: Diagnosis not present

## 2022-07-14 DIAGNOSIS — C7931 Secondary malignant neoplasm of brain: Secondary | ICD-10-CM | POA: Diagnosis not present

## 2022-07-14 DIAGNOSIS — Z510 Encounter for antineoplastic radiation therapy: Secondary | ICD-10-CM | POA: Diagnosis not present

## 2022-07-14 DIAGNOSIS — C7951 Secondary malignant neoplasm of bone: Secondary | ICD-10-CM | POA: Diagnosis not present

## 2022-07-14 LAB — RAD ONC ARIA SESSION SUMMARY
Course Elapsed Days: 8
Plan Fractions Treated to Date: 7
Plan Prescribed Dose Per Fraction: 3 Gy
Plan Total Fractions Prescribed: 10
Plan Total Prescribed Dose: 30 Gy
Reference Point Dosage Given to Date: 21 Gy
Reference Point Session Dosage Given: 3 Gy
Session Number: 7

## 2022-07-15 ENCOUNTER — Other Ambulatory Visit: Payer: Self-pay

## 2022-07-15 ENCOUNTER — Ambulatory Visit
Admission: RE | Admit: 2022-07-15 | Discharge: 2022-07-15 | Disposition: A | Discharge status: Home / Self Care | Payer: PPO | Source: Ambulatory Visit | Attending: Radiation Oncology | Admitting: Radiation Oncology

## 2022-07-15 DIAGNOSIS — Z510 Encounter for antineoplastic radiation therapy: Secondary | ICD-10-CM | POA: Diagnosis not present

## 2022-07-15 DIAGNOSIS — G4733 Obstructive sleep apnea (adult) (pediatric): Secondary | ICD-10-CM | POA: Diagnosis not present

## 2022-07-15 DIAGNOSIS — Z87891 Personal history of nicotine dependence: Secondary | ICD-10-CM | POA: Diagnosis not present

## 2022-07-15 DIAGNOSIS — C3412 Malignant neoplasm of upper lobe, left bronchus or lung: Secondary | ICD-10-CM | POA: Diagnosis not present

## 2022-07-15 DIAGNOSIS — C7931 Secondary malignant neoplasm of brain: Secondary | ICD-10-CM | POA: Diagnosis not present

## 2022-07-15 DIAGNOSIS — C7951 Secondary malignant neoplasm of bone: Secondary | ICD-10-CM | POA: Diagnosis not present

## 2022-07-15 LAB — RAD ONC ARIA SESSION SUMMARY
Course Elapsed Days: 9
Plan Fractions Treated to Date: 8
Plan Prescribed Dose Per Fraction: 3 Gy
Plan Total Fractions Prescribed: 10
Plan Total Prescribed Dose: 30 Gy
Reference Point Dosage Given to Date: 24 Gy
Reference Point Session Dosage Given: 3 Gy
Session Number: 8

## 2022-07-16 ENCOUNTER — Other Ambulatory Visit: Payer: Self-pay

## 2022-07-16 ENCOUNTER — Ambulatory Visit
Admission: RE | Admit: 2022-07-16 | Discharge: 2022-07-16 | Disposition: A | Discharge status: Home / Self Care | Payer: PPO | Source: Ambulatory Visit | Attending: Radiation Oncology | Admitting: Radiation Oncology

## 2022-07-16 DIAGNOSIS — C7931 Secondary malignant neoplasm of brain: Secondary | ICD-10-CM | POA: Diagnosis not present

## 2022-07-16 DIAGNOSIS — C7951 Secondary malignant neoplasm of bone: Secondary | ICD-10-CM | POA: Diagnosis not present

## 2022-07-16 DIAGNOSIS — Z510 Encounter for antineoplastic radiation therapy: Secondary | ICD-10-CM | POA: Diagnosis not present

## 2022-07-16 DIAGNOSIS — C3412 Malignant neoplasm of upper lobe, left bronchus or lung: Secondary | ICD-10-CM | POA: Diagnosis not present

## 2022-07-16 DIAGNOSIS — Z87891 Personal history of nicotine dependence: Secondary | ICD-10-CM | POA: Diagnosis not present

## 2022-07-16 LAB — RAD ONC ARIA SESSION SUMMARY
Course Elapsed Days: 10
Plan Fractions Treated to Date: 9
Plan Prescribed Dose Per Fraction: 3 Gy
Plan Total Fractions Prescribed: 10
Plan Total Prescribed Dose: 30 Gy
Reference Point Dosage Given to Date: 27 Gy
Reference Point Session Dosage Given: 3 Gy
Session Number: 9

## 2022-07-17 ENCOUNTER — Ambulatory Visit
Admission: RE | Admit: 2022-07-17 | Discharge: 2022-07-17 | Disposition: A | Discharge status: Home / Self Care | Payer: PPO | Source: Ambulatory Visit | Attending: Radiation Oncology | Admitting: Radiation Oncology

## 2022-07-17 ENCOUNTER — Other Ambulatory Visit: Payer: Self-pay

## 2022-07-17 DIAGNOSIS — C7931 Secondary malignant neoplasm of brain: Secondary | ICD-10-CM | POA: Diagnosis not present

## 2022-07-17 DIAGNOSIS — Z510 Encounter for antineoplastic radiation therapy: Secondary | ICD-10-CM | POA: Diagnosis not present

## 2022-07-17 DIAGNOSIS — C3412 Malignant neoplasm of upper lobe, left bronchus or lung: Secondary | ICD-10-CM | POA: Diagnosis not present

## 2022-07-17 DIAGNOSIS — J9601 Acute respiratory failure with hypoxia: Secondary | ICD-10-CM | POA: Diagnosis not present

## 2022-07-17 DIAGNOSIS — Z87891 Personal history of nicotine dependence: Secondary | ICD-10-CM | POA: Diagnosis not present

## 2022-07-17 DIAGNOSIS — C7951 Secondary malignant neoplasm of bone: Secondary | ICD-10-CM | POA: Diagnosis not present

## 2022-07-17 LAB — RAD ONC ARIA SESSION SUMMARY
Course Elapsed Days: 11
Plan Fractions Treated to Date: 10
Plan Prescribed Dose Per Fraction: 3 Gy
Plan Total Fractions Prescribed: 10
Plan Total Prescribed Dose: 30 Gy
Reference Point Dosage Given to Date: 30 Gy
Reference Point Session Dosage Given: 3 Gy
Session Number: 10

## 2022-07-20 NOTE — Radiation Completion Notes (Signed)
Patient Name: [name], [name] MRN: [id number] Date of Birth: 03/15/1951 Referring Physician: Arlan Organ, M.D. Date of Service: 2022-07-20 Radiation Oncologist: Lonie Peak, M.D. [facility]                             RADIATION ONCOLOGY END OF TREATMENT NOTE     Diagnosis: C79.51 Secondary malignant neoplasm of bone Staging on 2021-06-06: Small cell lung cancer, left upper lobe T=cT2b, N=cN3, M=pM1c Intent: Palliative     ==========DELIVERED PLANS==========  First Treatment Date: 2022-07-06 - Last Treatment Date: 2022-07-17   Plan Name: Pelvis_Sacrum Site: Pelvis Technique: 3D Mode: Photon Dose Per Fraction: 3 Gy Prescribed Dose (Delivered / Prescribed): 30 Gy / 30 Gy Prescribed Fxs (Delivered / Prescribed): 10 / 10     ==========ON TREATMENT VISIT DATES========== 2022-07-13, 2022-07-16     ==========UPCOMING VISITS==========       ==========APPENDIX - ON TREATMENT VISIT NOTES==========   See weekly On Treatment Notes is Epic for details.

## 2022-07-24 ENCOUNTER — Inpatient Hospital Stay (HOSPITAL_BASED_OUTPATIENT_CLINIC_OR_DEPARTMENT_OTHER): Payer: PPO | Admitting: Hematology & Oncology

## 2022-07-24 ENCOUNTER — Inpatient Hospital Stay: Payer: PPO

## 2022-07-24 ENCOUNTER — Encounter: Payer: Self-pay | Admitting: Hematology & Oncology

## 2022-07-24 ENCOUNTER — Inpatient Hospital Stay: Payer: PPO | Attending: Hematology & Oncology

## 2022-07-24 ENCOUNTER — Other Ambulatory Visit: Payer: Self-pay | Admitting: Oncology

## 2022-07-24 ENCOUNTER — Encounter: Payer: Self-pay | Admitting: *Deleted

## 2022-07-24 VITALS — BP 116/81 | HR 105 | Temp 98.1°F | Resp 20 | Ht 59.0 in | Wt 108.1 lb

## 2022-07-24 DIAGNOSIS — Z7901 Long term (current) use of anticoagulants: Secondary | ICD-10-CM | POA: Diagnosis not present

## 2022-07-24 DIAGNOSIS — R0602 Shortness of breath: Secondary | ICD-10-CM | POA: Diagnosis not present

## 2022-07-24 DIAGNOSIS — C3412 Malignant neoplasm of upper lobe, left bronchus or lung: Secondary | ICD-10-CM

## 2022-07-24 DIAGNOSIS — Z923 Personal history of irradiation: Secondary | ICD-10-CM | POA: Insufficient documentation

## 2022-07-24 DIAGNOSIS — M545 Low back pain, unspecified: Secondary | ICD-10-CM | POA: Diagnosis not present

## 2022-07-24 DIAGNOSIS — C3490 Malignant neoplasm of unspecified part of unspecified bronchus or lung: Secondary | ICD-10-CM | POA: Diagnosis not present

## 2022-07-24 DIAGNOSIS — R002 Palpitations: Secondary | ICD-10-CM | POA: Insufficient documentation

## 2022-07-24 DIAGNOSIS — I2693 Single subsegmental pulmonary embolism without acute cor pulmonale: Secondary | ICD-10-CM | POA: Insufficient documentation

## 2022-07-24 DIAGNOSIS — R059 Cough, unspecified: Secondary | ICD-10-CM | POA: Insufficient documentation

## 2022-07-24 DIAGNOSIS — Z79899 Other long term (current) drug therapy: Secondary | ICD-10-CM | POA: Diagnosis not present

## 2022-07-24 DIAGNOSIS — Z885 Allergy status to narcotic agent status: Secondary | ICD-10-CM | POA: Insufficient documentation

## 2022-07-24 DIAGNOSIS — Z5111 Encounter for antineoplastic chemotherapy: Secondary | ICD-10-CM | POA: Diagnosis present

## 2022-07-24 LAB — CMP (CANCER CENTER ONLY)
ALT: 11 U/L (ref 0–44)
AST: 15 U/L (ref 15–41)
Albumin: 3.9 g/dL (ref 3.5–5.0)
Alkaline Phosphatase: 54 U/L (ref 38–126)
Anion gap: 11 (ref 5–15)
BUN: 15 mg/dL (ref 8–23)
CO2: 27 mmol/L (ref 22–32)
Calcium: 9.3 mg/dL (ref 8.9–10.3)
Chloride: 98 mmol/L (ref 98–111)
Creatinine: 0.64 mg/dL (ref 0.44–1.00)
GFR, Estimated: >60 mL/min (ref >60)
Glucose, Bld: 148 mg/dL — ABNORMAL HIGH (ref 70–99)
Potassium: 3.7 mmol/L (ref 3.5–5.1)
Sodium: 136 mmol/L (ref 135–145)
Total Bilirubin: 0.3 mg/dL (ref 0.3–1.2)
Total Protein: 7.2 g/dL (ref 6.5–8.1)

## 2022-07-24 LAB — CBC WITH DIFFERENTIAL (CANCER CENTER ONLY)
Abs Immature Granulocytes: 0.03 10*3/uL (ref 0.00–0.07)
Basophils Absolute: 0 10*3/uL (ref 0.0–0.1)
Basophils Relative: 0 %
Eosinophils Absolute: 0.5 10*3/uL (ref 0.0–0.5)
Eosinophils Relative: 10 %
HCT: 33.9 % — ABNORMAL LOW (ref 36.0–46.0)
Hemoglobin: 11.2 g/dL — ABNORMAL LOW (ref 12.0–15.0)
Immature Granulocytes: 1 %
Lymphocytes Relative: 12 %
Lymphs Abs: 0.6 10*3/uL — ABNORMAL LOW (ref 0.7–4.0)
MCH: 31.1 pg (ref 26.0–34.0)
MCHC: 33 g/dL (ref 30.0–36.0)
MCV: 94.2 fL (ref 80.0–100.0)
Monocytes Absolute: 0.7 10*3/uL (ref 0.1–1.0)
Monocytes Relative: 14 %
Neutro Abs: 3 10*3/uL (ref 1.7–7.7)
Neutrophils Relative %: 63 %
Platelet Count: 183 10*3/uL (ref 150–400)
RBC: 3.6 MIL/uL — ABNORMAL LOW (ref 3.87–5.11)
RDW: 13.1 % (ref 11.5–15.5)
WBC Count: 4.8 10*3/uL (ref 4.0–10.5)
nRBC: 0 % (ref 0.0–0.2)

## 2022-07-24 LAB — LACTATE DEHYDROGENASE: LDH: 179 U/L (ref 98–192)

## 2022-07-24 MED ORDER — HYDROMORPHONE HCL 2 MG PO TABS: 2.0000 mg | ORAL_TABLET | ORAL | 0 refills | Status: DC | PRN

## 2022-07-24 MED ORDER — AMOXICILLIN 500 MG PO CAPS: 2000.0000 mg | ORAL_CAPSULE | Freq: Once | ORAL | 0 refills | Status: AC

## 2022-07-24 MED ORDER — XTAMPZA ER 18 MG PO C12A: 18.0000 mg | EXTENDED_RELEASE_CAPSULE | Freq: Two times a day (BID) | ORAL | 0 refills | Status: DC

## 2022-07-24 MED ORDER — HEPARIN SOD (PORK) LOCK FLUSH 100 UNIT/ML IV SOLN
500.0000 [IU] | Freq: Once | INTRAVENOUS | Status: AC
  Administered 2022-07-24: 500 [IU] via INTRAVENOUS

## 2022-07-24 MED ORDER — PREDNISONE 20 MG PO TABS: 20.0000 mg | ORAL_TABLET | Freq: Every day | ORAL | 3 refills | Status: DC

## 2022-07-24 MED ORDER — SODIUM CHLORIDE 0.9% FLUSH
10.0000 mL | INTRAVENOUS | Status: DC | PRN
  Administered 2022-07-24: 10 mL via INTRAVENOUS

## 2022-07-24 MED ORDER — SODIUM CHLORIDE 0.9 % IV SOLN: Freq: Once | INTRAVENOUS | Status: AC

## 2022-07-24 NOTE — Progress Notes (Signed)
Hematology and Oncology Follow Up Visit  [name] [id number] 14-Jan-1951 72 y.o. 07/24/2022   Principle Diagnosis:  Small cell lung cancer -- Extensive stage Pulmonary embolism -right upper lobe segmental/subsegmental   Current Therapy:        Carbo/VP-16/Tecentriq -- s/p cycle 6 -- start on 06/11/2021 Xgeva 120 mg subcu every 3 months -- next dose on 07/2022 Radiation therapy to the chest-completed 01/06/2022 Eliquis 5 mg p.o. twice daily Lurbinectiden -- s/p cycle #2 on -start 04/15/2022 S/p SBRT for CNS mets -- completed on 04/29/2022   Interim History:  [name] is here today for follow-up.  She still having problems with pain.  There is still in the lower back.  She has had radiation therapy to the lower back.  Her main complaint is been lower back pain.  She has had spinal metastasis.  She has had radiation therapy in the past the upper  sacral area.  This was completed back in early April.  We will have to get a MRI and see how things look.  We are going to have to make an adjustment with her pain medication.  She is on Xtampza.  I will increase this up to 18 mg p.o. twice daily.  She had been on oxycodone/acetaminophen.  I will now put her on some hydromorphone.  Clearly, have to try to get her pain under better control.  She has been on 2 lines of chemotherapy.  We can certainly try third line.  However, I think success rate probably is going to be less than 25%.  She would like to try treatment.  1 treatment I can think of might be single agent topotecan.  I think she is eating okay.  She is having no problems with nausea or vomiting.  She is having no problems with bowels or bladder.  There is no diarrhea.  She has had no bleeding.  There is no leg pain or weakness.  She is on Eliquis because of pulmonary emboli.  She is doing well on the Eliquis.  There is been no bleeding.  Currently, I would have said that her performance status is probably ECOG  1-2.    Medications:  Allergies as of 07/24/2022       Reactions   Hydrocodone-acetaminophen Other (See Comments)   Nightmares, skin crawling   Lyrica [pregabalin] Other (See Comments)   Hallucinations    Crestor [rosuvastatin Calcium] Other (See Comments)   Leg cramps        Medication List        Accurate as of July 24, 2022  4:26 PM. If you have any questions, ask your nurse or doctor.          acetaminophen 325 MG tablet Commonly known as: TYLENOL Take 1 tablet (325 mg total) by mouth every 6 (six) hours as needed for moderate pain.   albuterol 108 (90 Base) MCG/ACT inhaler Commonly known as: VENTOLIN HFA Inhale 2 puffs into the lungs every 6 (six) hours as needed for wheezing or shortness of breath.   ALPRAZolam 0.5 MG tablet Commonly known as: XANAX Take 1 tablet (0.5 mg total) by mouth 3 (three) times daily as needed.   apixaban 5 MG Tabs tablet Commonly known as: ELIQUIS Take 1 tablet (5 mg total) by mouth 2 (two) times daily.   blood glucose meter kit and supplies Kit Use daily   FLUoxetine 20 MG capsule Commonly known as: PROZAC TAKE ONE CAPSULE BY MOUTH DAILY   fluticasone 50  MCG/ACT nasal spray Commonly known as: FLONASE Place 1 spray into both nostrils daily.   Insulin Pen Needle 32G X 5 MM Misc Commonly known as: NovoTwist Use daily at Bedtime with Lantus   ipratropium-albuterol 0.5-2.5 (3) MG/3ML Soln Commonly known as: DUONEB Take 3 mLs by nebulization every 6 (six) hours as needed.   Lantus SoloStar 100 UNIT/ML Solostar Pen Generic drug: insulin glargine Inject 30 Units into the skin daily. HS What changed:  when to take this additional instructions   levothyroxine 125 MCG tablet Commonly known as: Synthroid Take 1 tablet (125 mcg total) by mouth daily before breakfast.   lidocaine-prilocaine cream Commonly known as: EMLA Apply 1 Application topically as needed.   meloxicam 15 MG tablet Commonly known as: MOBIC TAKE 1  TABLET BY MOUTH DAILY   metFORMIN 500 MG 24 hr tablet Commonly known as: GLUCOPHAGE-XR Take 1 tablet (500 mg total) by mouth in the morning and at bedtime.   midodrine 5 MG tablet Commonly known as: PROAMATINE Take 1 tablet (5 mg total) by mouth 3 (three) times daily with meals.   omeprazole 20 MG capsule Commonly known as: PRILOSEC Take 1 capsule (20 mg total) by mouth daily. What changed: when to take this   ONE TOUCH ULTRA 2 w/Device Kit Check blood sugar as directed. Dx:E11.65 What changed: Another medication with the same name was changed. Make sure you understand how and when to take each.   OneTouch Verio Flex System w/Device Kit USE AS DIRECTED TO CHECK BLOOD SUGAR FOUR TIMES A DAY What changed: See the new instructions.   OneTouch Verio test strip Generic drug: glucose blood USE ONE STRIP TO TEST DAILY What changed: See the new instructions.   oxyCODONE-acetaminophen 7.5-325 MG tablet Commonly known as: Percocet Take 1 tablet by mouth every 6 (six) hours as needed for severe pain.   Trulicity 0.75 MG/0.5ML Sopn Generic drug: Dulaglutide Inject 0.75 mg into the skin every Saturday.   valACYclovir 1000 MG tablet Commonly known as: VALTREX TAKE ONE TABLET BY MOUTH DAILY AS NEEDED        Allergies:  Allergies  Allergen Reactions   Hydrocodone-Acetaminophen Other (See Comments)    Nightmares, skin crawling   Lyrica [Pregabalin] Other (See Comments)    Hallucinations    Crestor [Rosuvastatin Calcium] Other (See Comments)    Leg cramps    Past Medical History, Surgical history, Social history, and Family History were reviewed and updated.  Review of Systems: Review of Systems  Constitutional:  Positive for malaise/fatigue.  HENT: Negative.    Eyes: Negative.   Respiratory:  Positive for cough and shortness of breath.   Cardiovascular:  Positive for palpitations.  Gastrointestinal: Negative.   Genitourinary: Negative.   Musculoskeletal: Negative.    Skin: Negative.   Neurological: Negative.   Endo/Heme/Allergies: Negative.   Psychiatric/Behavioral: Negative.       Physical Exam: Temperature is 98.1.  Pulse 105.  Blood pressure 116/81.  Weight is 108 pounds.   Wt Readings from Last 3 Encounters:  07/24/22 108 lb 1.9 oz (49 kg)  07/02/22 116 lb 12.8 oz (53 kg)  07/01/22 115 lb 2 oz (52.2 kg)   Physical Exam Vitals reviewed.  HENT:     Head: Normocephalic and atraumatic.  Eyes:     Pupils: Pupils are equal, round, and reactive to light.  Cardiovascular:     Rate and Rhythm: Normal rate and regular rhythm.     Heart sounds: Normal heart sounds.  Pulmonary:  Effort: Pulmonary effort is normal.     Breath sounds: Normal breath sounds.  Abdominal:     General: Bowel sounds are normal.     Palpations: Abdomen is soft.  Musculoskeletal:        General: No tenderness or deformity. Normal range of motion.     Cervical back: Normal range of motion.  Lymphadenopathy:     Cervical: No cervical adenopathy.  Skin:    General: Skin is warm and dry.     Findings: No erythema or rash.  Neurological:     Mental Status: She is alert and oriented to person, place, and time.  Psychiatric:        Behavior: Behavior normal.        Thought Content: Thought content normal.        Judgment: Judgment normal.    Lab Results  Component Value Date   WBC 4.8 07/24/2022   HGB 11.2 (L) 07/24/2022   HCT 33.9 (L) 07/24/2022   MCV 94.2 07/24/2022   PLT 183 07/24/2022   Lab Results  Component Value Date   FERRITIN 1,198 (H) 03/05/2022   IRON 90 03/05/2022   TIBC 239 (L) 03/05/2022   UIBC 149 03/05/2022   IRONPCTSAT 38 (H) 03/05/2022   Lab Results  Component Value Date   RETICCTPCT 3.6 (H) 03/05/2022   RBC 3.60 (L) 07/24/2022   No results found for: "KPAFRELGTCHN", "LAMBDASER", "KAPLAMBRATIO" No results found for: "IGGSERUM", "IGA", "IGMSERUM" No results found for: "TOTALPROTELP", "ALBUMINELP", "A1GS", "A2GS", "BETS",  "BETA2SER", "GAMS", "MSPIKE", "SPEI"   Chemistry      Component Value Date/Time   NA 136 07/24/2022 1506   K 3.7 07/24/2022 1506   CL 98 07/24/2022 1506   CO2 27 07/24/2022 1506   BUN 15 07/24/2022 1506   CREATININE 0.64 07/24/2022 1506   CREATININE 0.88 05/08/2021 1126      Component Value Date/Time   CALCIUM 9.3 07/24/2022 1506   ALKPHOS 54 07/24/2022 1506   AST 15 07/24/2022 1506   ALT 11 07/24/2022 1506   BILITOT 0.3 07/24/2022 1506       Impression and Plan: Ms. Culbertson is a very pleasant 72 yo caucasian female with extensive stage small cell lung cancer.  She has had chemotherapy and immunotherapy.  She then had radiation therapy for residual disease.  We then had her on second line therapy with lurbinectedin.  Unfortunate, she really did not have a good response to this.  I am little bit troubled by the weight loss.  I really hate this for her.  I know she is trying hard.  We will go ahead and give topotecan to try.  I think this is a 1 option that we might have that she hopefully can tolerate.  I went over the fact that was topotecan she may have some diarrhea.  She will have to watch out for blood counts.  We will try her on weekly topotecan.  Hopefully she can tolerate this.  I would like to get an MRI of her spine.  I do see what this looks like.  I do not know if we can give her any more radiation therapy to the lower back.  He clearly looking at quality of life issues.  Hopefully, we might get a response with the topotecan.  We will try to get treatment started in a week or so.  I will plan to see her back in about 3 weeks.   Josph Macho, MD 4/19/20244:26 PM

## 2022-07-24 NOTE — Patient Instructions (Signed)

## 2022-07-24 NOTE — Addendum Note (Signed)
Addended by: Lacie Draft on: 07/24/2022 04:52 PM   Modules accepted: Orders

## 2022-07-24 NOTE — Patient Instructions (Signed)

## 2022-07-27 ENCOUNTER — Encounter: Payer: Self-pay | Admitting: Hematology & Oncology

## 2022-07-27 ENCOUNTER — Other Ambulatory Visit (HOSPITAL_COMMUNITY): Payer: Self-pay

## 2022-07-27 ENCOUNTER — Other Ambulatory Visit: Payer: Self-pay | Admitting: *Deleted

## 2022-07-27 MED ORDER — HYDROMORPHONE HCL 2 MG PO TABS
2.0000 mg | ORAL_TABLET | ORAL | 0 refills | Status: DC | PRN
  Filled 2022-07-27: qty 120, 20d supply, fill #0

## 2022-07-27 NOTE — Progress Notes (Signed)
DISCONTINUE ON PATHWAY REGIMEN - Small Cell Lung     A cycle is every 21 days:     Lurbinectedin   **Always confirm dose/schedule in your pharmacy ordering system**  REASON: Disease Progression PRIOR TREATMENT: LOS405: Lurbinectedin 3.2 mg/m2 IV q21 Days Until Progression or Unacceptable Toxicity TREATMENT RESPONSE: Progressive Disease (PD)  START OFF PATHWAY REGIMEN - Small Cell Lung   [id number]:Topotecan 4 mg/m2 IV Weekly:   Administer weekly:     Topotecan   **Always confirm dose/schedule in your pharmacy ordering system**  Patient Characteristics: Relapsed or Progressive Disease, Third Line and Beyond Therapeutic Status: Relapsed or Progressive Disease Line of Therapy: Third Line and Beyond  Intent of Therapy: Non-Curative / Palliative Intent, Discussed with Patient

## 2022-07-29 ENCOUNTER — Telehealth: Payer: Self-pay | Admitting: *Deleted

## 2022-07-29 ENCOUNTER — Ambulatory Visit (HOSPITAL_COMMUNITY)
Admission: RE | Admit: 2022-07-29 | Discharge: 2022-07-29 | Disposition: A | Discharge status: Home / Self Care | Payer: PPO | Source: Ambulatory Visit | Attending: Hematology & Oncology | Admitting: Hematology & Oncology

## 2022-07-29 ENCOUNTER — Encounter: Payer: Self-pay | Admitting: Hematology & Oncology

## 2022-07-29 DIAGNOSIS — C3412 Malignant neoplasm of upper lobe, left bronchus or lung: Secondary | ICD-10-CM | POA: Insufficient documentation

## 2022-07-29 DIAGNOSIS — C7951 Secondary malignant neoplasm of bone: Secondary | ICD-10-CM | POA: Diagnosis not present

## 2022-07-29 MED ORDER — GADOBUTROL 1 MMOL/ML IV SOLN
4.0000 mL | Freq: Once | INTRAVENOUS | Status: AC | PRN
  Administered 2022-07-29: 4 mL via INTRAVENOUS

## 2022-07-29 NOTE — Telephone Encounter (Signed)
Called patient and she is refusing the new treatment plan until she sees Dr. Myna Hidalgo. Per Erie Noe, Dr. Myna Hidalgo said to schedule 3 weeks out to see him. She has been scheduled for 08/19/22.

## 2022-07-29 NOTE — Progress Notes (Signed)
Patient progressing on current treatment. She is also c/o more back pain. An MRI will be needed to assess. Plan to start new regimen in next 7-10 days.  Oncology Nurse Navigator Documentation     07/24/2022    3:30 PM  Oncology Nurse Navigator Flowsheets  Navigator Follow Up Date: 07/30/2022  Navigator Follow Up Reason: Scan Review  Navigator Location [facility]  Navigator Encounter Type Follow-up Appt  Patient Visit Type MedOnc  Treatment Phase Active Tx  Barriers/Navigation Needs Coordination of Care;Education  Interventions None Required  Acuity Level 2-Minimal Needs (1-2 Barriers Identified)  Support Groups/Services Friends and Family  Time Spent with Patient 15

## 2022-07-29 NOTE — Telephone Encounter (Signed)
Call received from patient stating that she has decided to not receive Topotecan at this time and would like to return to see Dr. Myna Hidalgo in three weeks.  Dr. Myna Hidalgo notified.

## 2022-07-30 ENCOUNTER — Encounter: Payer: Self-pay | Admitting: *Deleted

## 2022-07-30 ENCOUNTER — Other Ambulatory Visit: Payer: Self-pay | Admitting: Radiation Therapy

## 2022-07-30 ENCOUNTER — Ambulatory Visit
Admission: RE | Admit: 2022-07-30 | Discharge: 2022-07-30 | Disposition: A | Discharge status: Home / Self Care | Payer: PPO | Source: Ambulatory Visit | Attending: Radiation Oncology | Admitting: Radiation Oncology

## 2022-07-30 DIAGNOSIS — C7931 Secondary malignant neoplasm of brain: Secondary | ICD-10-CM

## 2022-07-30 DIAGNOSIS — R9089 Other abnormal findings on diagnostic imaging of central nervous system: Secondary | ICD-10-CM | POA: Diagnosis not present

## 2022-07-30 MED ORDER — GADOPICLENOL 0.5 MMOL/ML IV SOLN
5.0000 mL | Freq: Once | INTRAVENOUS | Status: AC | PRN
  Administered 2022-07-30: 5 mL via INTRAVENOUS

## 2022-07-30 NOTE — Progress Notes (Signed)
Reviewed MRI results which show progression of disease.   Patient was to start new treatment next week. She was called to schedule, but refused stating she wanted to follow up with Dr Myna Hidalgo first. Her next appointment is scheduled for 08/19/2022. Notified Dr Myna Hidalgo  Oncology Nurse Navigator Documentation     07/30/2022    8:00 AM  Oncology Nurse Navigator Flowsheets  Navigator Follow Up Date: 08/19/2022  Navigator Follow Up Reason: Follow-up Appointment  Navigator Location [facility]  Navigator Encounter Type Scan Review  Patient Visit Type MedOnc  Treatment Phase Active Tx  Barriers/Navigation Needs Coordination of Care;Education  Interventions Coordination of Care  Acuity Level 2-Minimal Needs (1-2 Barriers Identified)  Coordination of Care Other  Support Groups/Services Friends and Family  Time Spent with Patient 30

## 2022-07-31 ENCOUNTER — Ambulatory Visit (INDEPENDENT_AMBULATORY_CARE_PROVIDER_SITE_OTHER): Payer: PPO

## 2022-07-31 ENCOUNTER — Ambulatory Visit (INDEPENDENT_AMBULATORY_CARE_PROVIDER_SITE_OTHER): Payer: PPO | Admitting: Nurse Practitioner

## 2022-07-31 ENCOUNTER — Encounter: Payer: Self-pay | Admitting: Nurse Practitioner

## 2022-07-31 VITALS — BP 108/70 | HR 102 | Temp 98.0°F | Ht 59.0 in | Wt 108.6 lb

## 2022-07-31 DIAGNOSIS — J439 Emphysema, unspecified: Secondary | ICD-10-CM | POA: Diagnosis not present

## 2022-07-31 DIAGNOSIS — G4733 Obstructive sleep apnea (adult) (pediatric): Secondary | ICD-10-CM | POA: Diagnosis not present

## 2022-07-31 DIAGNOSIS — I2699 Other pulmonary embolism without acute cor pulmonale: Secondary | ICD-10-CM | POA: Diagnosis not present

## 2022-07-31 DIAGNOSIS — J189 Pneumonia, unspecified organism: Secondary | ICD-10-CM

## 2022-07-31 DIAGNOSIS — C3412 Malignant neoplasm of upper lobe, left bronchus or lung: Secondary | ICD-10-CM

## 2022-07-31 NOTE — Assessment & Plan Note (Signed)
Transitioned from lovenox to Eliquis. Admits to adherence.

## 2022-07-31 NOTE — Assessment & Plan Note (Signed)
OSA on CPAP with supplemental oxygen bled through. She had ONO on CPAP/ra in February 2024 with significant oxygen desaturations. She was started on CPAP/3 lpm and repeat ONO showed she still spent 15 min </88% with SpO2 low 84%. She was advised to undergo CPAP titration study to determine appropriate settings - currently awaiting scheduling.

## 2022-07-31 NOTE — Assessment & Plan Note (Signed)
Progression of disease. Preparing to start 3rd line chemotherapy and possibly palliative radiation to spine lesions. Follow up with Dr. Myna Hidalgo and Dr. Roselind Messier as scheduled.

## 2022-07-31 NOTE — Patient Instructions (Addendum)
Continue Albuterol inhaler 2 puffs or duoneb every 6 hours as needed for shortness of breath or wheezing Continue Eliquis Twice daily  Continue to monitor your oxygen at home for goal >88-90% Continue CPAP with 3 lpm oxygen bled in every night  Repeat chest x ray today - I will call you with results  Follow up with oncology as scheduled   Attend CPAP titration once scheduled    Follow up in 3 months with Dr. Delton Coombes. If symptoms do not improve or worsen, please contact office for sooner follow up or seek emergency care.

## 2022-07-31 NOTE — Assessment & Plan Note (Signed)
Clinically improved. She has completed appropriate abx courses. We will repeat CXR today. Advised on return precautions.   Patient Instructions  Continue Albuterol inhaler 2 puffs or duoneb every 6 hours as needed for shortness of breath or wheezing Continue Eliquis Twice daily  Continue to monitor your oxygen at home for goal >88-90% Continue CPAP with 3 lpm oxygen bled in every night  Repeat chest x ray today - I will call you with results  Follow up with oncology as scheduled   Attend CPAP titration once scheduled    Follow up in 3 months with Dr. Delton Coombes. If symptoms do not improve or worsen, please contact office for sooner follow up or seek emergency care.

## 2022-07-31 NOTE — Progress Notes (Signed)
@Patient  ID: [name], female    DOB: Jan 12, 1951, 72 y.o.   MRN: [id number]  Chief Complaint  Patient presents with   Follow-up    Pt states she has been doing good since last visit and denies any complaints.    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  Followed by Dr. Myna Hidalgo with oncology.  She is a patient of Dr. Kavin Leech and last seen in office 05/05/2022 by [facility] NP.  Past medical history significant for diabetes, GERD with hiatal hernia, OSA on CPAP, osteoarthritis and DJD.  She was recently admitted from 02/25/2022 to 03/12/2022 for acute hypoxemic respiratory failure felt to be multifactorial related to pulmonary embolism and possible pneumonia versus radiation pneumonitis.  She had presented to the ED with hypoxia and URI associated with myalgias, chills, fever and urinary frequency and diarrhea.  COVID at home was negative.  CTA demonstrated small segmental and 6 subsegmental PE in right upper lobe and patchy infiltrates bilaterally concerning for pneumonia.  No evidence of right heart strain on echo. She was started on supplemental oxygen, ceftriaxone and and azithromycin as well as heparin drip.  Oxygen requirements decreased; still required supplemental oxygen with ambulation.  She was discharged home on 2 L.  Her antibiotics were completed during her hospital stay.  She was discharged on steroid taper for possible pneumonitis as well as Lovenox for management of her PE.  TEST/EVENTS:  02/25/2022 CTA chest: Segmental and subsegmental right upper lobe pulmonary artery suggestive of nonocclusive PE.  Hiatal hernia.  Unchanged lymphadenopathy.  Emphysema is present.  There is patchy GGO bilaterally.  Prior bone mets seen 06/01/2022 ONO CPAP/ra: 2 hr 34 mi </88%, SpO2 low  70% 06/19/2022 PET scan: Hypermetabolic mediastinal adenopathy has progressed.  Overall decreased extent of hypermetabolism in the left lung with posttreatment collapse/consolidation in left lower lobe.  New hypermetabolic subpleural nodule in the left lower lobe measuring 8 mm.  Pleural thickening and nodularity in the posterior inferior left hemothorax, measuring 1.7 x 2.3 and hypermetabolic.  Centrally necrotic mass in the right hepatic lobe, enlarged.  Left adrenal node is enlarged.  New and increasingly hypermetabolic lesions throughout the visualized osseous structures.  New hypermetabolism in the left scapula.  Small pericardial effusion. 06/23/2022 ONO: 15 min 3 sec <88% on CPAP/3 lpm  06/27/2022 CXR: Similar appearance of the retrocardiac left base collapse/consolidative opacity.  Interstitial markings are diffusely coarsened with chronic features.  Nodular fullness in the right hilar region is similar.  03/26/2022: Sudie Grumbling with Serin Thornell NP for hospital follow-up.  Hospitalized 02/25/2022-03/12/2022 for acute respiratory failure secondary to multifocal pna vs radiation induced pneumonitis, less likely PE. She completed antimicrobial therapy during her hospital stay. She was discharged on extended prednisone taper, bactrim for PJP prophylaxis, and supplemental O2. She is clinically improved. Has completed steroid course within the last few days. Instructed her that she should discontinue bactrim now as this was prophylaxis while on high dose steroids. Walking oximetry today without desaturations on room air. Plan to repeat imaging in 6 weeks to ensure resolution. She was provided with strict return/ED precautions.  Likely provoked in the setting of pna/pneumonitis. She will need at least 3 months of therapy but given her history, she is high risk for recurrence so may need lifelong therapy.  Will defer to oncology, who is currently managing this. Advised her to discuss with Dr. Myna Hidalgo about alternative oral  anticoagulation therapy and if this would be appropriate option for her. Aware of red flag symptoms and ED precautions while on therapy.   05/05/2022: OV with Birgit Nowling NP for follow up with her husband. She has been feeling well since she was here last. Her breathing is stable. She feels like she can complete ADLs without any issues. No significant cough or chest congestion. She tells me she feels back to her normal. She was a little confused about her oxygen therapy so she has still been wearing it during the day and at night for the most part. She did complete ONO but she is not sure if she did it on oxygen or off. She ran out of her lovenox last week so has been off of this. They did not recall previous discussions of having to remain on it for a minimum of 3 months. Denies any hemoptysis, calf pain/swelling, dizziness/lightheadedness, or chest discomfort/pain.   07/31/2022: Today - follow up Patient presents today for follow-up.  Unfortunately since she was here last time, she was admitted 06/26/2022 to 06/29/2022 for acute on chronic respiratory failure secondary to left lower lobe pneumonia/metapneumovirus.  She was treated with empiric antibiotics and discharged with improved oxygenation status on cefdinir and azithromycin.  She also had a PET scan prior to admission that showed interval disease progression including new osseous disease to lumbar spine.  She was referred back to radiation oncology and had an MRI of the spine this week.  Currently awaiting MRI of the brain.  She is also going to be starting on on third line chemotherapy with topotecan.  Note reviewed from Dr. Myna Hidalgo stating that success rate is likely less than 25%. Today, she tells me that her breathing is fine.  Feels like it is back to her normal.  She does not have any productive cough or wheezing.  Chest congestion has resolved.  She has not been having to use her oxygen during the day at home.  She is still sleeping with it as well as  CPAP at night.  Her fatigue symptoms are about the same as they have been.  She is always tired and she has chronic back pain.  Awaiting to see if they need to repeat radiation of her spine.  Denies any fevers, chills, night sweats, hemoptysis.  Eating and drinking well.  She is still not had CPAP titration study.   Allergies  Allergen Reactions   Hydrocodone-Acetaminophen Other (See Comments)    Nightmares, skin crawling   Lyrica [Pregabalin] Other (See Comments)    Hallucinations    Crestor [Rosuvastatin Calcium] Other (See Comments)    Leg cramps    Immunization History  Administered Date(s) Administered   DTaP 01/24/1979   Fluad Quad(high Dose 65+) 12/31/2018, 01/24/2020, 01/10/2021, 01/30/2022   Influenza Split 01/22/2010, 02/05/2012   Influenza Whole 01/22/2010, 02/05/2012   Influenza, High Dose Seasonal PF 01/29/2017, 02/14/2018   Influenza,inj,Quad PF,6+ Mos 02/19/2014   Influenza-Unspecified 02/19/2014, 01/17/2015, 01/28/2016   Janssen (J&J) SARS-COV-2 Vaccination 07/07/2019   Moderna SARS-COV2 Booster Vaccination 09/04/2020   Pneumococcal Conjugate-13 07/08/2015   Pneumococcal Polysaccharide-23 01/05/2009, 02/19/2014   Td 01/24/2007   Td (Adult),5 Lf Tetanus Toxid, Preservative Free 01/24/2007   Zoster, Live 02/16/2012    Past Medical History:  Diagnosis Date   Arthritis    Cancer (HCC)    Diabetes mellitus    Esophagitis  Goals of care, counseling/discussion 05/23/2021   Hiatal hernia    History of radiation therapy    Left Lung- 11/27/21-01/08/22- Dr. 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) 06/06/2021   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HCC) 06/06/2021   Small cell lung cancer, left upper lobe (HCC) 06/06/2021   Thyroid disease    TMJ (sprain of temporomandibular joint)      Tobacco History: Social History   Tobacco Use  Smoking Status Former   Packs/day: 1.00   Years: 48.00   Additional pack years: 0.00   Total pack years: 48.00   Types: Cigarettes   Quit date: 02/01/2017   Years since quitting: 5.4  Smokeless Tobacco Never   Counseling given: Not Answered   Outpatient Medications Prior to Visit  Medication Sig Dispense Refill   acetaminophen (TYLENOL) 325 MG tablet Take 1 tablet (325 mg total) by mouth every 6 (six) hours as needed for moderate pain.     albuterol (VENTOLIN HFA) 108 (90 Base) MCG/ACT inhaler Inhale 2 puffs into the lungs every 6 (six) hours as needed for wheezing or shortness of breath. 8 g 2   ALPRAZolam (XANAX) 0.5 MG tablet Take 1 tablet (0.5 mg total) by mouth 3 (three) times daily as needed. 90 tablet 1   apixaban (ELIQUIS) 5 MG TABS tablet Take 1 tablet (5 mg total) by mouth 2 (two) times daily. 60 tablet 2   blood glucose meter kit and supplies KIT Use daily 1 each 0   Blood Glucose Monitoring Suppl (ONE TOUCH ULTRA 2) w/Device KIT Check blood sugar as directed. Dx:E11.65 1 kit 0   Blood Glucose Monitoring Suppl (ONETOUCH VERIO FLEX SYSTEM) w/Device KIT USE AS DIRECTED TO CHECK BLOOD SUGAR FOUR TIMES A DAY (Patient taking differently: 1 each by Other route 4 (four) times daily.) 1 kit 0   FLUoxetine (PROZAC) 20 MG capsule TAKE ONE CAPSULE BY MOUTH DAILY 90 capsule 3   fluticasone (FLONASE) 50 MCG/ACT nasal spray Place 1 spray into both nostrils daily. 11.1 mL 1   glucose blood (ONETOUCH VERIO) test strip USE ONE STRIP TO TEST DAILY (Patient taking differently: 1 each by Other route daily.) 100 strip 1   HYDROmorphone (DILAUDID) 2 MG tablet Take 1 tablet (2 mg) by mouth every 4 hours as needed for severe pain. 120 tablet 0   insulin glargine (LANTUS SOLOSTAR) 100 UNIT/ML Solostar Pen Inject 30 Units into the skin daily. HS (Patient taking differently: Inject 30 Units into the skin 2 (two) times daily.)     Insulin Pen Needle  (NOVOTWIST) 32G X 5 MM MISC Use daily at Bedtime with Lantus 100 each 0   ipratropium-albuterol (DUONEB) 0.5-2.5 (3) MG/3ML SOLN Take 3 mLs by nebulization every 6 (six) hours as needed. 360 mL 3   levothyroxine (SYNTHROID) 125 MCG tablet Take 1 tablet (125 mcg total) by mouth daily before breakfast. 90 tablet 3   lidocaine-prilocaine (EMLA) cream Apply 1 Application topically as needed. 30 g 0   meloxicam (MOBIC) 15 MG tablet TAKE 1 TABLET BY MOUTH DAILY 30 tablet 2   metFORMIN (GLUCOPHAGE-XR) 500 MG 24 hr tablet Take 1 tablet (500 mg total) by mouth in the morning and at bedtime. 180 tablet 0   midodrine (PROAMATINE) 5 MG  tablet Take 1 tablet (5 mg total) by mouth 3 (three) times daily with meals. 90 tablet 0   omeprazole (PRILOSEC) 20 MG capsule Take 1 capsule (20 mg total) by mouth daily. (Patient taking differently: Take 20 mg by mouth at bedtime.) 90 capsule 3   oxyCODONE ER (XTAMPZA ER) 18 MG C12A Take 18 mg by mouth 2 (two) times daily. 60 capsule 0   predniSONE (DELTASONE) 20 MG tablet Take 1 tablet (20 mg total) by mouth daily with breakfast. Please take with food!!! 30 tablet 3   TRULICITY 0.75 MG/0.5ML SOPN Inject 0.75 mg into the skin every Saturday.     valACYclovir (VALTREX) 1000 MG tablet TAKE ONE TABLET BY MOUTH DAILY AS NEEDED 30 tablet 0   Facility-Administered Medications Prior to Visit  Medication Dose Route Frequency Provider Last Rate Last Admin   sodium chloride flush (NS) 0.9 % injection 10 mL  10 mL Intravenous PRN Erenest Blank, NP   10 mL at 08/18/21 4098     Review of Systems:   Constitutional: No weight loss or gain, night sweats, fevers, chills, +fatigue (baseline), or lassitude. HEENT: No headaches, difficulty swallowing, tooth/dental problems, or sore throat. No sneezing, itching, ear ache, nasal congestion, or post nasal drip CV:  No chest pain, orthopnea, PND, swelling in lower extremities, anasarca, dizziness, palpitations, syncope Resp: No shortness of  breath with exertion or at rest. No cough. No excess mucus or change in color of mucus. No hemoptysis. No wheezing.  No chest wall deformity GI:  No heartburn, indigestion, abdominal pain, nausea, vomiting, diarrhea, change in bowel habits, loss of appetite, bloody stools.  Skin: No rash, lesions, ulcerations, bruising  MSK:  No joint pain or swelling.  +back pain Neuro: No dizziness or lightheadedness.  Psych: No depression or anxiety. Mood stable.     Physical Exam:  BP 108/70 (BP Location: Right Arm, Patient Position: Sitting, Cuff Size: Normal)   Pulse (!) 102   Temp 98 F (36.7 C) (Oral)   Ht 4\' 11"  (1.499 m)   Wt 108 lb 9.6 oz (49.3 kg)   SpO2 92% Comment: RA  BMI 21.93 kg/m   GEN: Pleasant, interactive, well-kempt; in no acute distress. HEENT:  Normocephalic and atraumatic. PERRLA. Sclera white. Nasal turbinates pink, moist and patent bilaterally. No rhinorrhea present. Oropharynx pink and moist, without exudate or edema. No lesions, ulcerations, or postnasal drip.  NECK:  Supple w/ fair ROM. No JVD present. Normal carotid impulses w/o bruits. Thyroid symmetrical with no goiter or nodules palpated. No lymphadenopathy.   CV: RRR, no m/r/g, no peripheral edema. Pulses intact, +2 bilaterally. No cyanosis, pallor or clubbing. PULMONARY:  Unlabored, regular breathing. Clear bilaterally A&P w/o wheezes/rales/rhonchi. No accessory muscle use.  GI: BS present and normoactive. Soft, non-tender to palpation. No organomegaly or masses detected.  MSK: No erythema, warmth or tenderness. Cap refil <2 sec all extrem. No deformities or joint swelling noted.  Neuro: A/Ox3. No focal deficits noted.   Skin: Warm, no lesions or rashe Psych: Normal affect and behavior. Judgement and thought content appropriate.     Lab Results:  CBC    Component Value Date/Time   WBC 4.8 07/24/2022 1506   WBC 5.4 07/02/2022 1343   RBC 3.60 (L) 07/24/2022 1506   HGB 11.2 (L) 07/24/2022 1506   HCT 33.9 (L)  07/24/2022 1506   PLT 183 07/24/2022 1506   MCV 94.2 07/24/2022 1506   MCH 31.1 07/24/2022 1506   MCHC 33.0 07/24/2022 1506  RDW 13.1 07/24/2022 1506   LYMPHSABS 0.6 (L) 07/24/2022 1506   MONOABS 0.7 07/24/2022 1506   EOSABS 0.5 07/24/2022 1506   BASOSABS 0.0 07/24/2022 1506    BMET    Component Value Date/Time   NA 136 07/24/2022 1506   K 3.7 07/24/2022 1506   CL 98 07/24/2022 1506   CO2 27 07/24/2022 1506   GLUCOSE 148 (H) 07/24/2022 1506   BUN 15 07/24/2022 1506   CREATININE 0.64 07/24/2022 1506   CREATININE 0.88 05/08/2021 1126   CALCIUM 9.3 07/24/2022 1506   GFRNONAA >60 07/24/2022 1506   GFRAA >60 10/16/2018 2157    BNP    Component Value Date/Time   BNP 124.6 (H) 06/27/2022 1120     Imaging:  MR Lumbar Spine W Wo Contrast  Result Date: 07/29/2022 CLINICAL DATA:  Initial evaluation for low back pain, malignancy. EXAM: MRI LUMBAR SPINE WITHOUT AND WITH CONTRAST TECHNIQUE: Multiplanar and multiecho pulse sequences of the lumbar spine were obtained without and with intravenous contrast. CONTRAST:  4mL GADAVIST GADOBUTROL 1 MMOL/ML IV SOLN COMPARISON:  Prior study from 06/03/2022. FINDINGS: Segmentation: Standard. Lowest well-formed disc space labeled the L5-S1 level. Same numbering system employed as on previous exam. Alignment: Exaggeration of the normal lumbar lordosis. 2 mm retrolisthesis of L1 on L2. Vertebrae: Postoperative changes from prior PLIF at L4-5 and L5-S1. Metastatic lesions are seen involving the L1, L2, and L3 vertebral bodies. A lesion involving the left posterior L1 vertebral body/left pedicle measures 2 cm, previously 1.3 cm (series 7, image 12). Associated mild pathologic compression fracture at this level is relatively stable. There is a new 1.2 cm lesion within the L2 vertebral body (series 7, image 6). Additional previously seen L2 lesion measures similar at 1.3 cm (series 7, image 10). Lesion involving the left transverse process of L2, seen on  prior. Infiltrative lesion involving the left aspect of L3 with associated pathologic compression fracture, similar. Probable lesion involving the left transverse process of L3, not definitely seen on prior, and likely new. There is an additional 2.8 cm lesion involving the right aspect of S3 (series 7, image 3), previously 2.4 cm. Smaller lesion within the right posterior aspect of S1 measures 8 mm, similar (series 5, image 7). There is a new 8 mm lesion involving the right posterior aspect of T12 (series 5, image 5). Suspected additional subcentimeter lesion within the right posterior aspect of T11, partially visualized (series 5, image 5). Overall, these changes are consistent with progressive disease. No significant extra osseous or epidural extension of tumor. Vertebral body height otherwise maintained. No findings for osteomyelitis discitis. Conus medullaris and cauda equina: Conus extends to the L2-3 level. Conus and cauda equina appear normal. Paraspinal and other soft tissues: A left adrenal mass is increased in size now measuring up to 3.5 cm, previously 2.7 cm. Probable additional lesions along the partially visualized left diaphragmatic crus, partially visualized (series 10, image 1). Subcentimeter cystic lesion noted within the spleen. Few simple renal cysts noted, largest of which measures 1.9 cm. These are benign in appearance, with no follow-up imaging recommended. Disc levels: T12-L1: Mild right eccentric disc bulge with facet hypertrophy. No stenosis. L1-2: Mild disc bulge with superimposed small right subarticular disc protrusion. Mild facet hypertrophy. No significant stenosis. L2-3:  Negative interspace.  Mild facet hypertrophy.  No stenosis. L3-4: Negative interspace. Moderate facet hypertrophy. No significant spinal stenosis. Mild left greater than right L3 foraminal narrowing. L4-5:  Prior PLIF.  No residual or recurrent stenosis. L5-S1:  Prior PLIF.  No significant residual or recurrent  stenosis. IMPRESSION: 1. 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 No significant extra osseous or epidural extension of tumor. 2. Left adrenal mass, increased in size now measuring up to 3.5 cm, previously 2.7 cm, likely metastasis. Probable additional metastatic lesions along the partially visualized left diaphragmatic crus, partially visualized. 3. Prior PLIF at L4-5 and L5-S1 without residual or recurrent stenosis. Electronically Signed   By: Rise Mu M.D.   On: 07/29/2022 21:37    heparin lock flush 100 unit/mL     Date Action Dose Route User   07/24/2022 1648 Given 500 Units Intravenous Gilley, Ginger J, RN      0.9 %  sodium chloride infusion     Date Action Dose Route User   07/24/2022 1531 New Bag/Given (none) Intravenous Bartko, Val Eagle, RN      sodium chloride flush (NS) 0.9 % injection 10 mL     Date Action Dose Route User   07/24/2022 1648 Given 10 mL Intravenous Gilley, Ginger J, RN           No data to display          No results found for: "NITRICOXIDE"      Assessment & Plan:   Pneumonia Clinically improved. She has completed appropriate abx courses. We will repeat CXR today. Advised on return precautions.   Patient Instructions  Continue Albuterol inhaler 2 puffs or duoneb every 6 hours as needed for shortness of breath or wheezing Continue Eliquis Twice daily  Continue to monitor your oxygen at home for goal >88-90% Continue CPAP with 3 lpm oxygen bled in every night  Repeat chest x ray today - I will call you with results  Follow up with oncology as scheduled   Attend CPAP titration once scheduled    Follow up in 3 months with Dr. Delton Coombes. If symptoms do not improve or worsen, please contact office for sooner follow up or seek emergency care.    Small cell lung cancer,  left upper lobe (HCC) Progression of disease. Preparing to start 3rd line chemotherapy and possibly palliative radiation to spine lesions. Follow up with Dr. Myna Hidalgo and Dr. Roselind Messier as scheduled.   OSA (obstructive sleep apnea) OSA on CPAP with supplemental oxygen bled through. She had ONO on CPAP/ra in February 2024 with significant oxygen desaturations. She was started on CPAP/3 lpm and repeat ONO showed she still spent 15 min </88% with SpO2 low 84%. She was advised to undergo CPAP titration study to determine appropriate settings - currently awaiting scheduling.   Pulmonary embolism (HCC) Transitioned from lovenox to Eliquis. Admits to adherence.      I spent 35 minutes of dedicated to the care of this patient on the date of this encounter to include pre-visit review of records, face-to-face time with the patient discussing conditions above, post visit ordering of testing, clinical documentation with the electronic health record, making appropriate referrals as documented, and communicating necessary findings to members of the patients care team.  Noemi Chapel, NP 07/31/2022  Pt aware and understands NP's role.

## 2022-08-01 ENCOUNTER — Other Ambulatory Visit: Payer: Self-pay

## 2022-08-01 DIAGNOSIS — J449 Chronic obstructive pulmonary disease, unspecified: Secondary | ICD-10-CM | POA: Diagnosis not present

## 2022-08-03 ENCOUNTER — Inpatient Hospital Stay: Payer: PPO

## 2022-08-03 ENCOUNTER — Encounter: Payer: Self-pay | Admitting: Hematology & Oncology

## 2022-08-03 DIAGNOSIS — C7931 Secondary malignant neoplasm of brain: Secondary | ICD-10-CM | POA: Diagnosis not present

## 2022-08-04 ENCOUNTER — Telehealth: Payer: Self-pay | Admitting: Radiation Therapy

## 2022-08-04 NOTE — Telephone Encounter (Signed)
I called Ms. Hartung to share the results of her recent brain MRI report and plans for her next MRI in three months. I shared that Dr. Basilio Cairo advised she continue with systemic treatment with Dr. Myna Hidalgo for her known skull mets and that the brain mets responded well to the radiation treatment course. I also asked about her back pain and if she has experienced any lower extremity weakness or numbness.   Mrs. Raska does not want to continue with systemic treatment because she didn't want to experience the side effects caused by treatment vs the 15% possibility that her skull mets would cause her symptoms. She is not having any weakness or numbness in her lower extremity and reports that her back pain has improved with the change of her pain medication. Overall she is doing well, just suffering with difficulty remembering things.   Jalene Mullet R.T.(R)(T) Radiation Special Procedures Navigator

## 2022-08-05 ENCOUNTER — Ambulatory Visit
Admission: RE | Admit: 2022-08-05 | Discharge: 2022-08-05 | Disposition: A | Discharge status: Home / Self Care | Source: Ambulatory Visit | Attending: Radiation Oncology | Admitting: Radiation Oncology

## 2022-08-05 ENCOUNTER — Other Ambulatory Visit: Payer: Self-pay

## 2022-08-05 DIAGNOSIS — C7951 Secondary malignant neoplasm of bone: Secondary | ICD-10-CM | POA: Diagnosis not present

## 2022-08-05 DIAGNOSIS — C787 Secondary malignant neoplasm of liver and intrahepatic bile duct: Secondary | ICD-10-CM | POA: Diagnosis not present

## 2022-08-05 DIAGNOSIS — C7931 Secondary malignant neoplasm of brain: Secondary | ICD-10-CM | POA: Diagnosis not present

## 2022-08-05 DIAGNOSIS — C3412 Malignant neoplasm of upper lobe, left bronchus or lung: Secondary | ICD-10-CM | POA: Diagnosis not present

## 2022-08-05 DIAGNOSIS — C3490 Malignant neoplasm of unspecified part of unspecified bronchus or lung: Secondary | ICD-10-CM | POA: Diagnosis not present

## 2022-08-05 DIAGNOSIS — C778 Secondary and unspecified malignant neoplasm of lymph nodes of multiple regions: Secondary | ICD-10-CM | POA: Insufficient documentation

## 2022-08-05 DIAGNOSIS — Z87891 Personal history of nicotine dependence: Secondary | ICD-10-CM | POA: Diagnosis not present

## 2022-08-06 ENCOUNTER — Other Ambulatory Visit: Payer: Self-pay

## 2022-08-06 DIAGNOSIS — C3490 Malignant neoplasm of unspecified part of unspecified bronchus or lung: Secondary | ICD-10-CM | POA: Diagnosis not present

## 2022-08-06 DIAGNOSIS — C7951 Secondary malignant neoplasm of bone: Secondary | ICD-10-CM | POA: Diagnosis not present

## 2022-08-06 DIAGNOSIS — C7931 Secondary malignant neoplasm of brain: Secondary | ICD-10-CM | POA: Diagnosis not present

## 2022-08-06 DIAGNOSIS — C3412 Malignant neoplasm of upper lobe, left bronchus or lung: Secondary | ICD-10-CM | POA: Diagnosis not present

## 2022-08-06 DIAGNOSIS — Z87891 Personal history of nicotine dependence: Secondary | ICD-10-CM | POA: Diagnosis not present

## 2022-08-10 ENCOUNTER — Other Ambulatory Visit: Payer: Self-pay

## 2022-08-10 ENCOUNTER — Ambulatory Visit
Admission: RE | Admit: 2022-08-10 | Discharge: 2022-08-10 | Disposition: A | Discharge status: Home / Self Care | Source: Ambulatory Visit | Attending: Radiation Oncology | Admitting: Radiation Oncology

## 2022-08-10 DIAGNOSIS — C3412 Malignant neoplasm of upper lobe, left bronchus or lung: Secondary | ICD-10-CM | POA: Diagnosis not present

## 2022-08-10 DIAGNOSIS — C7931 Secondary malignant neoplasm of brain: Secondary | ICD-10-CM | POA: Diagnosis not present

## 2022-08-10 DIAGNOSIS — C7951 Secondary malignant neoplasm of bone: Secondary | ICD-10-CM | POA: Diagnosis not present

## 2022-08-10 DIAGNOSIS — Z87891 Personal history of nicotine dependence: Secondary | ICD-10-CM | POA: Diagnosis not present

## 2022-08-10 DIAGNOSIS — C3490 Malignant neoplasm of unspecified part of unspecified bronchus or lung: Secondary | ICD-10-CM | POA: Diagnosis not present

## 2022-08-10 DIAGNOSIS — Z510 Encounter for antineoplastic radiation therapy: Secondary | ICD-10-CM | POA: Diagnosis not present

## 2022-08-10 DIAGNOSIS — C778 Secondary and unspecified malignant neoplasm of lymph nodes of multiple regions: Secondary | ICD-10-CM

## 2022-08-10 LAB — RAD ONC ARIA SESSION SUMMARY
Course Elapsed Days: 0
Plan Fractions Treated to Date: 1
Plan Prescribed Dose Per Fraction: 3 Gy
Plan Total Fractions Prescribed: 10
Plan Total Prescribed Dose: 30 Gy
Reference Point Dosage Given to Date: 3 Gy
Reference Point Session Dosage Given: 3 Gy
Session Number: 1

## 2022-08-11 ENCOUNTER — Ambulatory Visit

## 2022-08-11 ENCOUNTER — Ambulatory Visit
Admission: RE | Admit: 2022-08-11 | Discharge: 2022-08-11 | Disposition: A | Discharge status: Home / Self Care | Source: Ambulatory Visit | Attending: Radiation Oncology | Admitting: Radiation Oncology

## 2022-08-11 ENCOUNTER — Other Ambulatory Visit: Payer: Self-pay

## 2022-08-11 DIAGNOSIS — C3490 Malignant neoplasm of unspecified part of unspecified bronchus or lung: Secondary | ICD-10-CM | POA: Diagnosis not present

## 2022-08-11 DIAGNOSIS — C3412 Malignant neoplasm of upper lobe, left bronchus or lung: Secondary | ICD-10-CM | POA: Diagnosis not present

## 2022-08-11 DIAGNOSIS — C7931 Secondary malignant neoplasm of brain: Secondary | ICD-10-CM | POA: Diagnosis not present

## 2022-08-11 DIAGNOSIS — Z87891 Personal history of nicotine dependence: Secondary | ICD-10-CM | POA: Diagnosis not present

## 2022-08-11 DIAGNOSIS — C7951 Secondary malignant neoplasm of bone: Secondary | ICD-10-CM | POA: Diagnosis not present

## 2022-08-11 DIAGNOSIS — Z510 Encounter for antineoplastic radiation therapy: Secondary | ICD-10-CM | POA: Diagnosis not present

## 2022-08-11 LAB — RAD ONC ARIA SESSION SUMMARY
Course Elapsed Days: 1
Plan Fractions Treated to Date: 2
Plan Prescribed Dose Per Fraction: 3 Gy
Plan Total Fractions Prescribed: 10
Plan Total Prescribed Dose: 30 Gy
Reference Point Dosage Given to Date: 6 Gy
Reference Point Session Dosage Given: 3 Gy
Session Number: 2

## 2022-08-12 ENCOUNTER — Other Ambulatory Visit: Payer: Self-pay

## 2022-08-12 ENCOUNTER — Ambulatory Visit
Admission: RE | Admit: 2022-08-12 | Discharge: 2022-08-12 | Disposition: A | Discharge status: Home / Self Care | Source: Ambulatory Visit | Attending: Radiation Oncology | Admitting: Radiation Oncology

## 2022-08-12 DIAGNOSIS — C7931 Secondary malignant neoplasm of brain: Secondary | ICD-10-CM | POA: Diagnosis not present

## 2022-08-12 DIAGNOSIS — C7951 Secondary malignant neoplasm of bone: Secondary | ICD-10-CM | POA: Diagnosis not present

## 2022-08-12 DIAGNOSIS — C3412 Malignant neoplasm of upper lobe, left bronchus or lung: Secondary | ICD-10-CM | POA: Diagnosis not present

## 2022-08-12 DIAGNOSIS — Z87891 Personal history of nicotine dependence: Secondary | ICD-10-CM | POA: Diagnosis not present

## 2022-08-12 DIAGNOSIS — C3490 Malignant neoplasm of unspecified part of unspecified bronchus or lung: Secondary | ICD-10-CM | POA: Diagnosis not present

## 2022-08-12 DIAGNOSIS — Z510 Encounter for antineoplastic radiation therapy: Secondary | ICD-10-CM | POA: Diagnosis not present

## 2022-08-12 LAB — RAD ONC ARIA SESSION SUMMARY
Course Elapsed Days: 2
Plan Fractions Treated to Date: 3
Plan Prescribed Dose Per Fraction: 3 Gy
Plan Total Fractions Prescribed: 10
Plan Total Prescribed Dose: 30 Gy
Reference Point Dosage Given to Date: 9 Gy
Reference Point Session Dosage Given: 3 Gy
Session Number: 3

## 2022-08-12 NOTE — Progress Notes (Signed)
CXR improved. She has some persistent decreased airflow in the left lower lung. Can repeat at follow up with Dr. Delton Coombes to ensure resolution/stability. Thanks.

## 2022-08-13 ENCOUNTER — Other Ambulatory Visit: Payer: Self-pay

## 2022-08-13 ENCOUNTER — Ambulatory Visit
Admission: RE | Admit: 2022-08-13 | Discharge: 2022-08-13 | Disposition: A | Discharge status: Home / Self Care | Source: Ambulatory Visit | Attending: Radiation Oncology | Admitting: Radiation Oncology

## 2022-08-13 DIAGNOSIS — C7951 Secondary malignant neoplasm of bone: Secondary | ICD-10-CM | POA: Diagnosis not present

## 2022-08-13 DIAGNOSIS — C7931 Secondary malignant neoplasm of brain: Secondary | ICD-10-CM | POA: Diagnosis not present

## 2022-08-13 DIAGNOSIS — Z510 Encounter for antineoplastic radiation therapy: Secondary | ICD-10-CM | POA: Diagnosis not present

## 2022-08-13 DIAGNOSIS — C3412 Malignant neoplasm of upper lobe, left bronchus or lung: Secondary | ICD-10-CM | POA: Diagnosis not present

## 2022-08-13 DIAGNOSIS — Z87891 Personal history of nicotine dependence: Secondary | ICD-10-CM | POA: Diagnosis not present

## 2022-08-13 DIAGNOSIS — C3490 Malignant neoplasm of unspecified part of unspecified bronchus or lung: Secondary | ICD-10-CM | POA: Diagnosis not present

## 2022-08-13 LAB — RAD ONC ARIA SESSION SUMMARY
Course Elapsed Days: 3
Plan Fractions Treated to Date: 4
Plan Prescribed Dose Per Fraction: 3 Gy
Plan Total Fractions Prescribed: 10
Plan Total Prescribed Dose: 30 Gy
Reference Point Dosage Given to Date: 12 Gy
Reference Point Session Dosage Given: 3 Gy
Session Number: 4

## 2022-08-14 ENCOUNTER — Other Ambulatory Visit: Payer: Self-pay

## 2022-08-14 ENCOUNTER — Ambulatory Visit
Admission: RE | Admit: 2022-08-14 | Discharge: 2022-08-14 | Disposition: A | Discharge status: Home / Self Care | Source: Ambulatory Visit | Attending: Radiation Oncology | Admitting: Radiation Oncology

## 2022-08-14 DIAGNOSIS — C3490 Malignant neoplasm of unspecified part of unspecified bronchus or lung: Secondary | ICD-10-CM | POA: Diagnosis not present

## 2022-08-14 DIAGNOSIS — C7951 Secondary malignant neoplasm of bone: Secondary | ICD-10-CM | POA: Diagnosis not present

## 2022-08-14 DIAGNOSIS — C7931 Secondary malignant neoplasm of brain: Secondary | ICD-10-CM | POA: Diagnosis not present

## 2022-08-14 DIAGNOSIS — C3412 Malignant neoplasm of upper lobe, left bronchus or lung: Secondary | ICD-10-CM | POA: Diagnosis not present

## 2022-08-14 DIAGNOSIS — Z510 Encounter for antineoplastic radiation therapy: Secondary | ICD-10-CM | POA: Diagnosis not present

## 2022-08-14 DIAGNOSIS — Z87891 Personal history of nicotine dependence: Secondary | ICD-10-CM | POA: Diagnosis not present

## 2022-08-14 LAB — RAD ONC ARIA SESSION SUMMARY
Course Elapsed Days: 4
Plan Fractions Treated to Date: 5
Plan Prescribed Dose Per Fraction: 3 Gy
Plan Total Fractions Prescribed: 10
Plan Total Prescribed Dose: 30 Gy
Reference Point Dosage Given to Date: 15 Gy
Reference Point Session Dosage Given: 3 Gy
Session Number: 5

## 2022-08-16 DIAGNOSIS — J9601 Acute respiratory failure with hypoxia: Secondary | ICD-10-CM | POA: Diagnosis not present

## 2022-08-17 ENCOUNTER — Ambulatory Visit
Admission: RE | Admit: 2022-08-17 | Discharge: 2022-08-17 | Disposition: A | Discharge status: Home / Self Care | Source: Ambulatory Visit | Attending: Radiation Oncology | Admitting: Radiation Oncology

## 2022-08-17 ENCOUNTER — Other Ambulatory Visit: Payer: Self-pay

## 2022-08-17 DIAGNOSIS — Z87891 Personal history of nicotine dependence: Secondary | ICD-10-CM | POA: Diagnosis not present

## 2022-08-17 DIAGNOSIS — C3412 Malignant neoplasm of upper lobe, left bronchus or lung: Secondary | ICD-10-CM | POA: Diagnosis not present

## 2022-08-17 DIAGNOSIS — Z510 Encounter for antineoplastic radiation therapy: Secondary | ICD-10-CM | POA: Diagnosis not present

## 2022-08-17 DIAGNOSIS — C7931 Secondary malignant neoplasm of brain: Secondary | ICD-10-CM | POA: Diagnosis not present

## 2022-08-17 DIAGNOSIS — C3490 Malignant neoplasm of unspecified part of unspecified bronchus or lung: Secondary | ICD-10-CM | POA: Diagnosis not present

## 2022-08-17 DIAGNOSIS — C7951 Secondary malignant neoplasm of bone: Secondary | ICD-10-CM | POA: Diagnosis not present

## 2022-08-17 LAB — RAD ONC ARIA SESSION SUMMARY
Course Elapsed Days: 7
Plan Fractions Treated to Date: 6
Plan Prescribed Dose Per Fraction: 3 Gy
Plan Total Fractions Prescribed: 10
Plan Total Prescribed Dose: 30 Gy
Reference Point Dosage Given to Date: 18 Gy
Reference Point Session Dosage Given: 3 Gy
Session Number: 6

## 2022-08-18 ENCOUNTER — Ambulatory Visit

## 2022-08-18 ENCOUNTER — Other Ambulatory Visit: Payer: Self-pay

## 2022-08-18 ENCOUNTER — Ambulatory Visit
Admission: RE | Admit: 2022-08-18 | Discharge: 2022-08-18 | Disposition: A | Discharge status: Home / Self Care | Source: Ambulatory Visit | Attending: Radiation Oncology | Admitting: Radiation Oncology

## 2022-08-18 DIAGNOSIS — C3490 Malignant neoplasm of unspecified part of unspecified bronchus or lung: Secondary | ICD-10-CM | POA: Diagnosis not present

## 2022-08-18 DIAGNOSIS — C7951 Secondary malignant neoplasm of bone: Secondary | ICD-10-CM | POA: Diagnosis not present

## 2022-08-18 DIAGNOSIS — C3412 Malignant neoplasm of upper lobe, left bronchus or lung: Secondary | ICD-10-CM | POA: Diagnosis not present

## 2022-08-18 DIAGNOSIS — Z510 Encounter for antineoplastic radiation therapy: Secondary | ICD-10-CM | POA: Diagnosis not present

## 2022-08-18 DIAGNOSIS — C7931 Secondary malignant neoplasm of brain: Secondary | ICD-10-CM | POA: Diagnosis not present

## 2022-08-18 DIAGNOSIS — Z87891 Personal history of nicotine dependence: Secondary | ICD-10-CM | POA: Diagnosis not present

## 2022-08-18 LAB — RAD ONC ARIA SESSION SUMMARY
Course Elapsed Days: 8
Plan Fractions Treated to Date: 7
Plan Prescribed Dose Per Fraction: 3 Gy
Plan Total Fractions Prescribed: 10
Plan Total Prescribed Dose: 30 Gy
Reference Point Dosage Given to Date: 21 Gy
Reference Point Session Dosage Given: 3 Gy
Session Number: 7

## 2022-08-19 ENCOUNTER — Other Ambulatory Visit: Payer: Self-pay

## 2022-08-19 ENCOUNTER — Ambulatory Visit
Admission: RE | Admit: 2022-08-19 | Discharge: 2022-08-19 | Disposition: A | Discharge status: Home / Self Care | Source: Ambulatory Visit | Attending: Radiation Oncology | Admitting: Radiation Oncology

## 2022-08-19 ENCOUNTER — Encounter: Payer: Self-pay | Admitting: Hematology & Oncology

## 2022-08-19 ENCOUNTER — Inpatient Hospital Stay (HOSPITAL_BASED_OUTPATIENT_CLINIC_OR_DEPARTMENT_OTHER): Admitting: Hematology & Oncology

## 2022-08-19 ENCOUNTER — Encounter: Payer: Self-pay | Admitting: *Deleted

## 2022-08-19 ENCOUNTER — Inpatient Hospital Stay

## 2022-08-19 VITALS — BP 118/71 | HR 106 | Temp 98.1°F | Ht 59.0 in | Wt 106.0 lb

## 2022-08-19 DIAGNOSIS — C3412 Malignant neoplasm of upper lobe, left bronchus or lung: Secondary | ICD-10-CM

## 2022-08-19 DIAGNOSIS — C3490 Malignant neoplasm of unspecified part of unspecified bronchus or lung: Secondary | ICD-10-CM | POA: Diagnosis not present

## 2022-08-19 DIAGNOSIS — Z885 Allergy status to narcotic agent status: Secondary | ICD-10-CM | POA: Insufficient documentation

## 2022-08-19 DIAGNOSIS — M549 Dorsalgia, unspecified: Secondary | ICD-10-CM | POA: Insufficient documentation

## 2022-08-19 DIAGNOSIS — Z87891 Personal history of nicotine dependence: Secondary | ICD-10-CM | POA: Diagnosis not present

## 2022-08-19 DIAGNOSIS — R5383 Other fatigue: Secondary | ICD-10-CM | POA: Insufficient documentation

## 2022-08-19 DIAGNOSIS — C7951 Secondary malignant neoplasm of bone: Secondary | ICD-10-CM | POA: Diagnosis not present

## 2022-08-19 DIAGNOSIS — R059 Cough, unspecified: Secondary | ICD-10-CM | POA: Insufficient documentation

## 2022-08-19 DIAGNOSIS — R0602 Shortness of breath: Secondary | ICD-10-CM | POA: Insufficient documentation

## 2022-08-19 DIAGNOSIS — R002 Palpitations: Secondary | ICD-10-CM | POA: Insufficient documentation

## 2022-08-19 DIAGNOSIS — Z923 Personal history of irradiation: Secondary | ICD-10-CM | POA: Insufficient documentation

## 2022-08-19 DIAGNOSIS — Z79899 Other long term (current) drug therapy: Secondary | ICD-10-CM | POA: Insufficient documentation

## 2022-08-19 DIAGNOSIS — I2693 Single subsegmental pulmonary embolism without acute cor pulmonale: Secondary | ICD-10-CM | POA: Insufficient documentation

## 2022-08-19 DIAGNOSIS — C7931 Secondary malignant neoplasm of brain: Secondary | ICD-10-CM | POA: Diagnosis not present

## 2022-08-19 DIAGNOSIS — Z510 Encounter for antineoplastic radiation therapy: Secondary | ICD-10-CM | POA: Diagnosis not present

## 2022-08-19 DIAGNOSIS — Z7901 Long term (current) use of anticoagulants: Secondary | ICD-10-CM | POA: Insufficient documentation

## 2022-08-19 DIAGNOSIS — Z95828 Presence of other vascular implants and grafts: Secondary | ICD-10-CM

## 2022-08-19 DIAGNOSIS — R110 Nausea: Secondary | ICD-10-CM | POA: Insufficient documentation

## 2022-08-19 LAB — RAD ONC ARIA SESSION SUMMARY
Course Elapsed Days: 9
Plan Fractions Treated to Date: 8
Plan Prescribed Dose Per Fraction: 3 Gy
Plan Total Fractions Prescribed: 10
Plan Total Prescribed Dose: 30 Gy
Reference Point Dosage Given to Date: 24 Gy
Reference Point Session Dosage Given: 3 Gy
Session Number: 8

## 2022-08-19 LAB — CBC WITH DIFFERENTIAL (CANCER CENTER ONLY)
Abs Immature Granulocytes: 0.02 10*3/uL (ref 0.00–0.07)
Basophils Absolute: 0 10*3/uL (ref 0.0–0.1)
Basophils Relative: 0 %
Eosinophils Absolute: 0.3 10*3/uL (ref 0.0–0.5)
Eosinophils Relative: 6 %
HCT: 35 % — ABNORMAL LOW (ref 36.0–46.0)
Hemoglobin: 11.3 g/dL — ABNORMAL LOW (ref 12.0–15.0)
Immature Granulocytes: 0 %
Lymphocytes Relative: 6 %
Lymphs Abs: 0.3 10*3/uL — ABNORMAL LOW (ref 0.7–4.0)
MCH: 30.9 pg (ref 26.0–34.0)
MCHC: 32.3 g/dL (ref 30.0–36.0)
MCV: 95.6 fL (ref 80.0–100.0)
Monocytes Absolute: 0.6 10*3/uL (ref 0.1–1.0)
Monocytes Relative: 13 %
Neutro Abs: 3.4 10*3/uL (ref 1.7–7.7)
Neutrophils Relative %: 75 %
Platelet Count: 203 10*3/uL (ref 150–400)
RBC: 3.66 MIL/uL — ABNORMAL LOW (ref 3.87–5.11)
RDW: 13.3 % (ref 11.5–15.5)
WBC Count: 4.5 10*3/uL (ref 4.0–10.5)
nRBC: 0 % (ref 0.0–0.2)

## 2022-08-19 LAB — CMP (CANCER CENTER ONLY)
ALT: 11 U/L (ref 0–44)
AST: 15 U/L (ref 15–41)
Albumin: 4 g/dL (ref 3.5–5.0)
Alkaline Phosphatase: 62 U/L (ref 38–126)
Anion gap: 10 (ref 5–15)
BUN: 13 mg/dL (ref 8–23)
CO2: 28 mmol/L (ref 22–32)
Calcium: 9.5 mg/dL (ref 8.9–10.3)
Chloride: 98 mmol/L (ref 98–111)
Creatinine: 0.54 mg/dL (ref 0.44–1.00)
GFR, Estimated: >60 mL/min (ref >60)
Glucose, Bld: 326 mg/dL — ABNORMAL HIGH (ref 70–99)
Potassium: 3.9 mmol/L (ref 3.5–5.1)
Sodium: 136 mmol/L (ref 135–145)
Total Bilirubin: 0.3 mg/dL (ref 0.3–1.2)
Total Protein: 7.1 g/dL (ref 6.5–8.1)

## 2022-08-19 LAB — LACTATE DEHYDROGENASE: LDH: 198 U/L — ABNORMAL HIGH (ref 98–192)

## 2022-08-19 LAB — PREALBUMIN: Prealbumin: 15 mg/dL — ABNORMAL LOW (ref 18–38)

## 2022-08-19 MED ORDER — HEPARIN SOD (PORK) LOCK FLUSH 100 UNIT/ML IV SOLN
500.0000 [IU] | Freq: Once | INTRAVENOUS | Status: AC
  Administered 2022-08-19: 500 [IU] via INTRAVENOUS

## 2022-08-19 MED ORDER — SODIUM CHLORIDE 0.9% FLUSH
10.0000 mL | Freq: Once | INTRAVENOUS | Status: AC
  Administered 2022-08-19: 10 mL via INTRAVENOUS

## 2022-08-19 NOTE — Patient Instructions (Signed)

## 2022-08-19 NOTE — Progress Notes (Signed)
Patient is finishing up radiation and has decided that she doesn't want any further treatment. She will proceed with hospice support.   Will discontinue active navigation, but be available to the patient as needed in the future.   Oncology Nurse Navigator Documentation     08/19/2022    1:45 PM  Oncology Nurse Navigator Flowsheets  Planned Course of Treatment Radiation  Phase of Treatment Radiation  Radiation Actual Start Date: 08/10/2022  Radiation Expected End Date: 08/21/2022  Navigation Complete Date: 08/19/2022  Post Navigation: Continue to Follow Patient? No  Reason Not Navigating Patient: Hospice/Death  Navigator Location [facility]  Navigator Encounter Type Follow-up Appt;Appt/Treatment Plan Review  Patient Visit Type MedOnc  Treatment Phase Active Tx  Barriers/Navigation Needs Coordination of Care;Education  Interventions None Required  Acuity Level 2-Minimal Needs (1-2 Barriers Identified)  Support Groups/Services Friends and Family  Time Spent with Patient 15

## 2022-08-19 NOTE — Progress Notes (Signed)
Hematology and Oncology Follow Up Visit  [name] [id number] [birth year] 72 y.o. 08/19/2022   Principle Diagnosis:  Small cell lung cancer -- Extensive stage Pulmonary embolism -right upper lobe segmental/subsegmental   Current Therapy:        Carbo/VP-16/Tecentriq -- s/p cycle 6 -- start on 06/11/2021 Xgeva 120 mg subcu every 3 months -- next dose on 07/2022 Radiation therapy to the chest-completed 01/06/2022 Eliquis 5 mg p.o. twice daily Lurbinectiden -- s/p cycle #2 on -start 04/15/2022 S/p SBRT for CNS mets -- completed on 04/29/2022   Interim History:  [name] is here today for follow-up.  She is doing a little bit better with her back pain.  There is still some issues with respect to the lower back discomfort.  This is all from her malignancy.  She has decided not to take any treatment.  I totally understand this.  As such, I think we need to get Hospice involved.  I will call them.  She is losing weight.  She said that she is not eating all that much.  She does not have much of an appetite.  There is been little nausea.  There is no vomiting.  She has had no issues with her bowels or bladder.  Again, our goal here is quality of life.  I really think that with Hospice, we can achieve this.  She has had no bleeding.  A problem now is that she has this lesion on her scalp.  This is on the parietal area over on the left side.  Is close to the midline.  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  She might need to have radiation for this so that does not cause problems for her.  She currently is finishing up radiation to her back.  I will see if Dr. Roselind Messier will be able to help out with this scalp lesion.  Overall, I would have to say that her performance status is probably ECOG 2.   Medications:  Allergies as of 08/19/2022       Reactions   Hydrocodone-acetaminophen Other (See Comments)   Nightmares, skin crawling   Lyrica [pregabalin] Other (See Comments)    Hallucinations    Crestor [rosuvastatin Calcium] Other (See Comments)   Leg cramps        Medication List        Accurate as of Aug 19, 2022  2:07 PM. If you have any questions, ask your nurse or doctor.          acetaminophen 325 MG tablet Commonly known as: TYLENOL Take 1 tablet (325 mg total) by mouth every 6 (six) hours as needed for moderate pain.   albuterol 108 (90 Base) MCG/ACT inhaler Commonly known as: VENTOLIN HFA Inhale 2 puffs into the lungs every 6 (six) hours as needed for wheezing or shortness of breath.   ALPRAZolam 0.5 MG tablet Commonly known as: XANAX Take 1 tablet (0.5 mg total) by mouth 3 (three) times daily as needed.   apixaban 5 MG Tabs tablet Commonly known as: ELIQUIS Take 1 tablet (5 mg total) by mouth 2 (two) times daily.   blood glucose meter kit and supplies Kit Use daily   FLUoxetine 20 MG capsule Commonly known as: PROZAC TAKE ONE CAPSULE BY MOUTH DAILY   fluticasone 50 MCG/ACT nasal spray Commonly known as: FLONASE Place 1 spray into both nostrils daily.   HYDROmorphone 2 MG tablet Commonly known as: Dilaudid Take 1 tablet (2 mg) by  mouth every 4 hours as needed for severe pain.   Insulin Pen Needle 32G X 5 MM Misc Commonly known as: NovoTwist Use daily at Bedtime with Lantus   ipratropium-albuterol 0.5-2.5 (3) MG/3ML Soln Commonly known as: DUONEB Take 3 mLs by nebulization every 6 (six) hours as needed.   Lantus SoloStar 100 UNIT/ML Solostar Pen Generic drug: insulin glargine Inject 30 Units into the skin daily. HS What changed:  when to take this additional instructions   levothyroxine 125 MCG tablet Commonly known as: Synthroid Take 1 tablet (125 mcg total) by mouth daily before breakfast.   lidocaine-prilocaine cream Commonly known as: EMLA Apply 1 Application topically as needed.   meloxicam 15 MG tablet Commonly known as: MOBIC TAKE 1 TABLET BY MOUTH DAILY   metFORMIN 500 MG 24 hr tablet Commonly  known as: GLUCOPHAGE-XR Take 1 tablet (500 mg total) by mouth in the morning and at bedtime.   midodrine 5 MG tablet Commonly known as: PROAMATINE Take 1 tablet (5 mg total) by mouth 3 (three) times daily with meals.   omeprazole 20 MG capsule Commonly known as: PRILOSEC Take 1 capsule (20 mg total) by mouth daily. What changed: when to take this   ONE TOUCH ULTRA 2 w/Device Kit Check blood sugar as directed. Dx:E11.65 What changed: Another medication with the same name was changed. Make sure you understand how and when to take each.   OneTouch Verio Flex System w/Device Kit USE AS DIRECTED TO CHECK BLOOD SUGAR FOUR TIMES A DAY What changed: See the new instructions.   OneTouch Verio test strip Generic drug: glucose blood USE ONE STRIP TO TEST DAILY What changed: See the new instructions.   predniSONE 20 MG tablet Commonly known as: DELTASONE Take 1 tablet (20 mg total) by mouth daily with breakfast. Please take with food!!!   Trulicity 0.75 MG/0.5ML Sopn Generic drug: Dulaglutide Inject 0.75 mg into the skin every Saturday.   valACYclovir 1000 MG tablet Commonly known as: VALTREX TAKE ONE TABLET BY MOUTH DAILY AS NEEDED   Xtampza ER 18 MG C12a Generic drug: oxyCODONE ER Take 18 mg by mouth 2 (two) times daily.        Allergies:  Allergies  Allergen Reactions   Hydrocodone-Acetaminophen Other (See Comments)    Nightmares, skin crawling   Lyrica [Pregabalin] Other (See Comments)    Hallucinations    Crestor [Rosuvastatin Calcium] Other (See Comments)    Leg cramps    Past Medical History, Surgical history, Social history, and Family History were reviewed and updated.  Review of Systems: Review of Systems  Constitutional:  Positive for malaise/fatigue.  HENT: Negative.    Eyes: Negative.   Respiratory:  Positive for cough and shortness of breath.   Cardiovascular:  Positive for palpitations.  Gastrointestinal: Negative.   Genitourinary: Negative.    Musculoskeletal: Negative.   Skin: Negative.   Neurological: Negative.   Endo/Heme/Allergies: Negative.   Psychiatric/Behavioral: Negative.       Physical Exam: Temperature is 98.1.  Pulse 105.  Blood pressure 116/81.  Weight is 108 pounds.   Wt Readings from Last 3 Encounters:  08/19/22 106 lb (48.1 kg)  07/31/22 108 lb 9.6 oz (49.3 kg)  07/24/22 108 lb 1.9 oz (49 kg)   Physical Exam Vitals reviewed.  HENT:     Head: Normocephalic and atraumatic.     Comments: On her scalp, in the parietal region just to the left of the midline is a verrucous type lesion.  It probably measures about  1 x 1 cm.  There is no exudate.  It is slightly tender to touch. Eyes:     Pupils: Pupils are equal, round, and reactive to light.  Cardiovascular:     Rate and Rhythm: Normal rate and regular rhythm.     Heart sounds: Normal heart sounds.  Pulmonary:     Effort: Pulmonary effort is normal.     Breath sounds: Normal breath sounds.  Abdominal:     General: Bowel sounds are normal.     Palpations: Abdomen is soft.  Musculoskeletal:        General: No tenderness or deformity. Normal range of motion.     Cervical back: Normal range of motion.  Lymphadenopathy:     Cervical: No cervical adenopathy.  Skin:    General: Skin is warm and dry.     Findings: No erythema or rash.  Neurological:     Mental Status: She is alert and oriented to person, place, and time.  Psychiatric:        Behavior: Behavior normal.        Thought Content: Thought content normal.        Judgment: Judgment normal.     Lab Results  Component Value Date   WBC 4.5 08/19/2022   HGB 11.3 (L) 08/19/2022   HCT 35.0 (L) 08/19/2022   MCV 95.6 08/19/2022   PLT 203 08/19/2022   Lab Results  Component Value Date   FERRITIN 1,198 (H) 03/05/2022   IRON 90 03/05/2022   TIBC 239 (L) 03/05/2022   UIBC 149 03/05/2022   IRONPCTSAT 38 (H) 03/05/2022   Lab Results  Component Value Date   RETICCTPCT 3.6 (H) 03/05/2022    RBC 3.66 (L) 08/19/2022   No results found for: "KPAFRELGTCHN", "LAMBDASER", "KAPLAMBRATIO" No results found for: "IGGSERUM", "IGA", "IGMSERUM" No results found for: "TOTALPROTELP", "ALBUMINELP", "A1GS", "A2GS", "BETS", "BETA2SER", "GAMS", "MSPIKE", "SPEI"   Chemistry      Component Value Date/Time   NA 136 08/19/2022 1327   K 3.9 08/19/2022 1327   CL 98 08/19/2022 1327   CO2 28 08/19/2022 1327   BUN 13 08/19/2022 1327   CREATININE 0.54 08/19/2022 1327   CREATININE 0.88 05/08/2021 1126      Component Value Date/Time   CALCIUM 9.5 08/19/2022 1327   ALKPHOS 62 08/19/2022 1327   AST 15 08/19/2022 1327   ALT 11 08/19/2022 1327   BILITOT 0.3 08/19/2022 1327       Impression and Plan: Ms. Charnock is a very pleasant 72 yo caucasian female with extensive stage small cell lung cancer.  She has had chemotherapy and immunotherapy.  She then had radiation therapy for residual disease.  We then had her on second line therapy with lurbinectedin.  Unfortunately, she really did not have a good response to this.  I am little bit troubled by the weight loss.  I really hate this for her.  I know she is trying hard.  I totally understand that she does not want any more treatment.  I cannot argue with her.  I think that there would be a significant risk of toxicity with any additional therapy.  Again, we will call Hospice.  I will call Dr. Roselind Messier of Radiation Oncology.  We will see if he can help with this lesion on the scalp.  Hopefully, her pain will improve.  I think Hospice will be able to help out with respect to the pain issues.  I would still like to see her back.  I probably would like to see her back in another 3 weeks or so.   Josph Macho, MD 5/15/20242:07 PM

## 2022-08-20 ENCOUNTER — Ambulatory Visit: Payer: PPO | Admitting: Radiation Oncology

## 2022-08-20 ENCOUNTER — Other Ambulatory Visit: Payer: Self-pay | Admitting: *Deleted

## 2022-08-20 ENCOUNTER — Telehealth: Payer: Self-pay | Admitting: *Deleted

## 2022-08-20 ENCOUNTER — Ambulatory Visit: Admission: RE | Admit: 2022-08-20 | Source: Ambulatory Visit

## 2022-08-20 ENCOUNTER — Ambulatory Visit
Admission: RE | Admit: 2022-08-20 | Discharge: 2022-08-20 | Disposition: A | Discharge status: Home / Self Care | Source: Ambulatory Visit | Attending: Radiation Oncology | Admitting: Radiation Oncology

## 2022-08-20 ENCOUNTER — Other Ambulatory Visit: Payer: Self-pay

## 2022-08-20 DIAGNOSIS — Z510 Encounter for antineoplastic radiation therapy: Secondary | ICD-10-CM | POA: Diagnosis not present

## 2022-08-20 DIAGNOSIS — C7931 Secondary malignant neoplasm of brain: Secondary | ICD-10-CM | POA: Diagnosis not present

## 2022-08-20 DIAGNOSIS — C3412 Malignant neoplasm of upper lobe, left bronchus or lung: Secondary | ICD-10-CM | POA: Diagnosis not present

## 2022-08-20 DIAGNOSIS — C3490 Malignant neoplasm of unspecified part of unspecified bronchus or lung: Secondary | ICD-10-CM | POA: Diagnosis not present

## 2022-08-20 DIAGNOSIS — C7951 Secondary malignant neoplasm of bone: Secondary | ICD-10-CM | POA: Diagnosis not present

## 2022-08-20 DIAGNOSIS — Z87891 Personal history of nicotine dependence: Secondary | ICD-10-CM | POA: Diagnosis not present

## 2022-08-20 LAB — RAD ONC ARIA SESSION SUMMARY
Course Elapsed Days: 10
Plan Fractions Treated to Date: 9
Plan Prescribed Dose Per Fraction: 3 Gy
Plan Total Fractions Prescribed: 10
Plan Total Prescribed Dose: 30 Gy
Reference Point Dosage Given to Date: 27 Gy
Reference Point Session Dosage Given: 3 Gy
Session Number: 9

## 2022-08-20 MED ORDER — XTAMPZA ER 18 MG PO C12A: 18.0000 mg | EXTENDED_RELEASE_CAPSULE | Freq: Two times a day (BID) | ORAL | 0 refills | Status: DC

## 2022-08-20 MED ORDER — HYDROMORPHONE HCL 2 MG PO TABS: 2.0000 mg | ORAL_TABLET | ORAL | 0 refills | Status: DC | PRN

## 2022-08-20 MED ORDER — APIXABAN 5 MG PO TABS: 5.0000 mg | ORAL_TABLET | Freq: Two times a day (BID) | ORAL | 2 refills | Status: DC

## 2022-08-20 NOTE — Telephone Encounter (Signed)
Call placed to [facility] at [facility] and referral placed per order of Dr. Myna Hidalgo.

## 2022-08-21 ENCOUNTER — Ambulatory Visit
Admission: RE | Admit: 2022-08-21 | Discharge: 2022-08-21 | Disposition: A | Discharge status: Home / Self Care | Source: Ambulatory Visit | Attending: Radiation Oncology | Admitting: Radiation Oncology

## 2022-08-21 ENCOUNTER — Other Ambulatory Visit: Payer: Self-pay

## 2022-08-21 ENCOUNTER — Ambulatory Visit: Payer: Self-pay | Admitting: Radiation Oncology

## 2022-08-21 DIAGNOSIS — C7931 Secondary malignant neoplasm of brain: Secondary | ICD-10-CM | POA: Diagnosis not present

## 2022-08-21 DIAGNOSIS — C3412 Malignant neoplasm of upper lobe, left bronchus or lung: Secondary | ICD-10-CM | POA: Diagnosis not present

## 2022-08-21 DIAGNOSIS — C3490 Malignant neoplasm of unspecified part of unspecified bronchus or lung: Secondary | ICD-10-CM | POA: Diagnosis not present

## 2022-08-21 DIAGNOSIS — Z510 Encounter for antineoplastic radiation therapy: Secondary | ICD-10-CM | POA: Diagnosis not present

## 2022-08-21 DIAGNOSIS — Z87891 Personal history of nicotine dependence: Secondary | ICD-10-CM | POA: Diagnosis not present

## 2022-08-21 DIAGNOSIS — C7951 Secondary malignant neoplasm of bone: Secondary | ICD-10-CM | POA: Diagnosis not present

## 2022-08-21 LAB — RAD ONC ARIA SESSION SUMMARY
Course Elapsed Days: 11
Plan Fractions Treated to Date: 10
Plan Prescribed Dose Per Fraction: 3 Gy
Plan Total Fractions Prescribed: 10
Plan Total Prescribed Dose: 30 Gy
Reference Point Dosage Given to Date: 30 Gy
Reference Point Session Dosage Given: 3 Gy
Session Number: 10

## 2022-08-26 NOTE — Radiation Completion Notes (Signed)
Patient Name: [name], [name] MRN: [id number] Date of Birth: 1950/06/09 Referring Physician: Arlan Organ, M.D. Date of Service: 2022-08-26 Radiation Oncologist: Lonie Peak, M.D. [facility] [place]                             RADIATION ONCOLOGY END OF TREATMENT NOTE     Diagnosis: C79.51 Secondary malignant neoplasm of bone Staging on 2021-06-06: Small cell lung cancer, left upper lobe (HCC) T=cT2b, N=cN3, M=pM1c Intent: Palliative     ==========DELIVERED PLANS==========  First Treatment Date: 2022-08-10 - Last Treatment Date: 2022-08-21   Plan Name: Spine_L Site: Lumbar Spine Technique: 3D Mode: Photon Dose Per Fraction: 3 Gy Prescribed Dose (Delivered / Prescribed): 30 Gy / 30 Gy Prescribed Fxs (Delivered / Prescribed): 10 / 10     ==========ON TREATMENT VISIT DATES========== 2022-08-11, 2022-08-18     ==========UPCOMING VISITS==========       ==========APPENDIX - ON TREATMENT VISIT NOTES==========   See weekly On Treatment Notes is Epic for details.

## 2022-08-31 DIAGNOSIS — J449 Chronic obstructive pulmonary disease, unspecified: Secondary | ICD-10-CM | POA: Diagnosis not present

## 2022-09-02 ENCOUNTER — Telehealth: Payer: Self-pay | Admitting: Radiation Oncology

## 2022-09-02 NOTE — Telephone Encounter (Signed)
Patient called to cancel appointment. She stated she does not want to reschedule at this time.

## 2022-09-10 ENCOUNTER — Inpatient Hospital Stay: Payer: PPO

## 2022-09-10 ENCOUNTER — Ambulatory Visit: Payer: PPO | Admitting: Hematology & Oncology

## 2022-09-17 ENCOUNTER — Encounter (HOSPITAL_BASED_OUTPATIENT_CLINIC_OR_DEPARTMENT_OTHER): Payer: PPO | Admitting: Pulmonary Disease

## 2022-09-21 ENCOUNTER — Telehealth: Payer: Self-pay | Admitting: *Deleted

## 2022-09-21 ENCOUNTER — Ambulatory Visit: Payer: Self-pay | Admitting: Radiation Oncology

## 2022-09-21 NOTE — Telephone Encounter (Signed)
CALLED PATIENT TO ASK ABOUT RESCHEDULING TODAY'S FU WITH DR. KINARD, LVM FOR A RETURN CALL

## 2022-10-01 ENCOUNTER — Other Ambulatory Visit: Payer: Self-pay | Admitting: Hematology & Oncology

## 2022-10-01 DIAGNOSIS — E032 Hypothyroidism due to medicaments and other exogenous substances: Secondary | ICD-10-CM

## 2022-10-05 ENCOUNTER — Other Ambulatory Visit (HOSPITAL_COMMUNITY): Payer: Self-pay

## 2022-10-05 ENCOUNTER — Telehealth: Payer: Self-pay | Admitting: Emergency Medicine

## 2022-10-05 ENCOUNTER — Encounter: Payer: Self-pay | Admitting: Hematology & Oncology

## 2022-10-05 MED ORDER — HYDROMORPHONE HCL 2 MG PO TABS
2.0000 mg | ORAL_TABLET | ORAL | 0 refills | Status: DC | PRN
  Filled 2022-10-05: qty 90, 15d supply, fill #0

## 2022-10-05 NOTE — Telephone Encounter (Signed)
Wanted to let dr. Ashley Mariner she is in hospice care now

## 2022-10-05 NOTE — Telephone Encounter (Signed)
Spoke with patient's husband Lyda Jester and let him know it says in patient's chart patient is on hospice.   Curtis voice was understanding.Nothing else further needed.

## 2022-10-06 ENCOUNTER — Other Ambulatory Visit (HOSPITAL_COMMUNITY): Payer: Self-pay

## 2022-10-09 ENCOUNTER — Encounter: Payer: Self-pay | Admitting: *Deleted

## 2022-10-09 NOTE — Progress Notes (Unsigned)
Fax received from [employer] [facility] to inform Dr. Myna Hidalgo that pt passed away on 10-10-2022 at 5:00PM.  Dr. Myna Hidalgo notified and flowers sent to patient's family.

## 2022-10-14 ENCOUNTER — Ambulatory Visit: Payer: PPO | Admitting: Emergency Medicine

## 2022-11-05 DEATH — deceased

## 2023-09-23 IMAGING — MR MR LUMBAR SPINE W/O CM
4 of 5 series · 24 of 48 positions shown · non-contrast
Comparison: MR lumbar 11/21/2016; X-ray lumbar 11/01/2018; CT
lumbar 08/15/2018.

CLINICAL DATA: Low back pain radiating to left leg for 3 weeks.

EXAM:
MRI LUMBAR SPINE WITHOUT CONTRAST
TECHNIQUE: Multiplanar, multisequence MR imaging of the lumbar spine was
performed. No intravenous contrast was administered.

[Series 3: T2 · sagittal · 4.0mm · 0.53mm/px · 6 of 22 slices shown (1 of 2)]
[im 1/22]
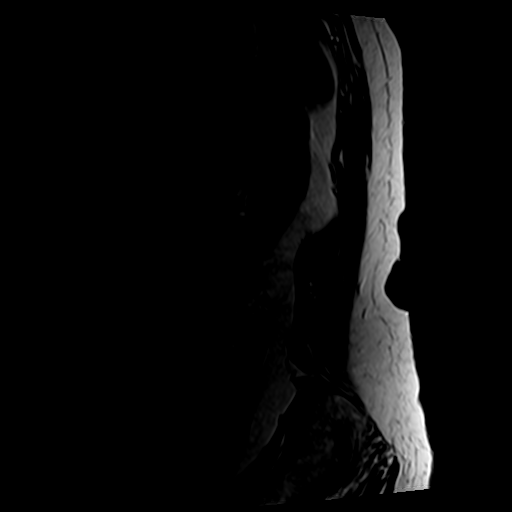
[im 5/22]
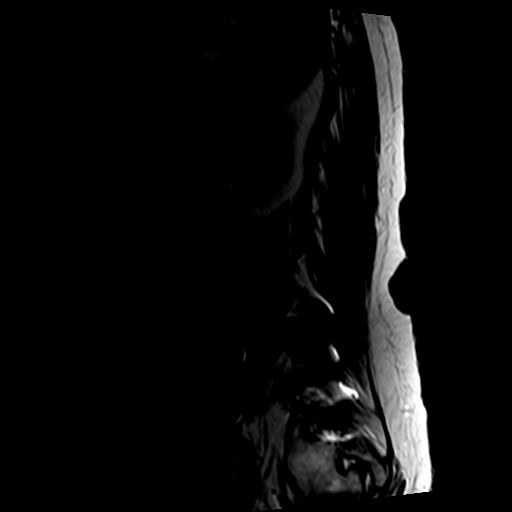
[im 9/22]
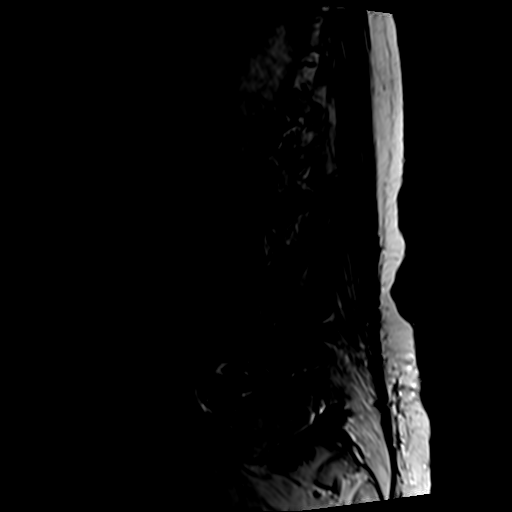
[im 13/22]
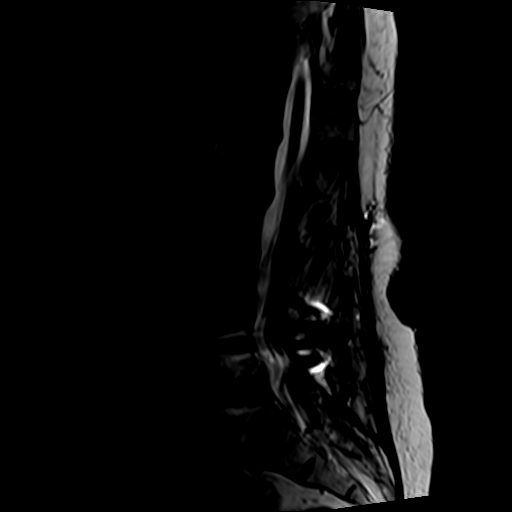
[im 17/22]
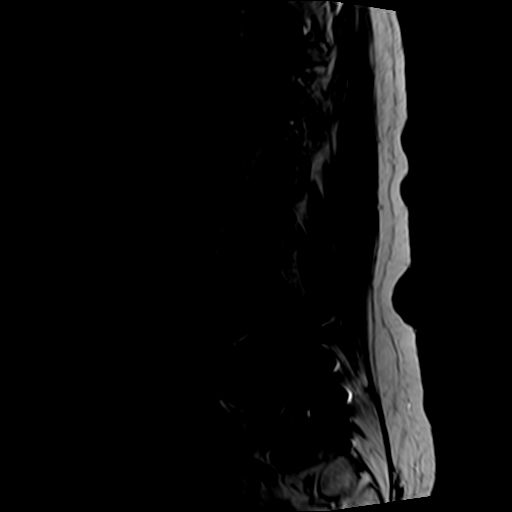
[im 22/22]
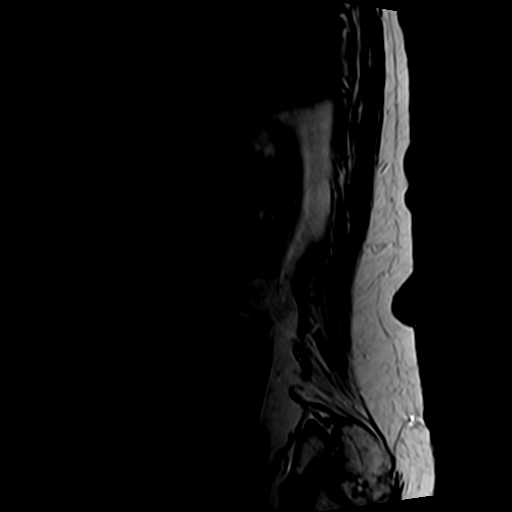

[Series 5: T1 · sagittal · 4.0mm · 0.53mm/px · 7 of 22 slices shown (1 of 2)]
[im 1/22]
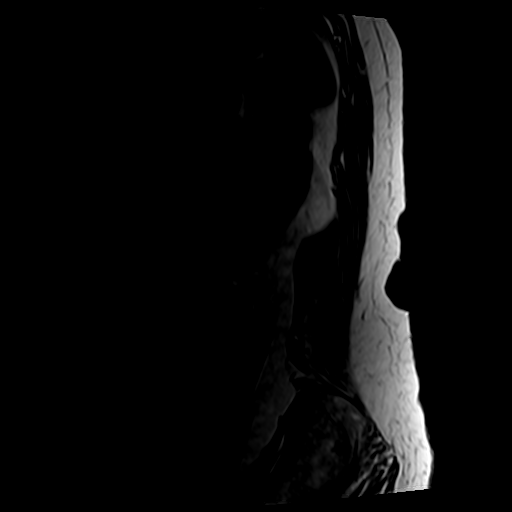
[im 4/22]
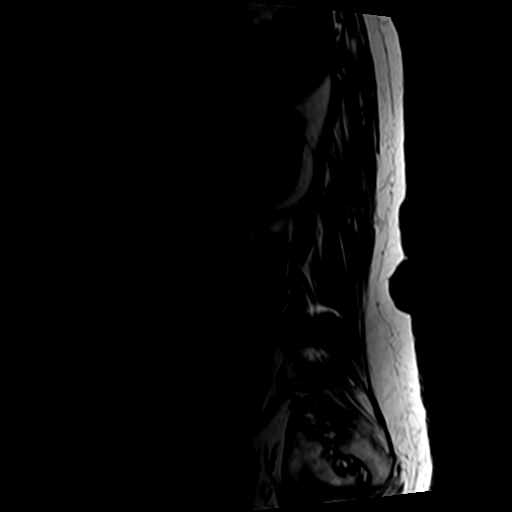
[im 8/22]
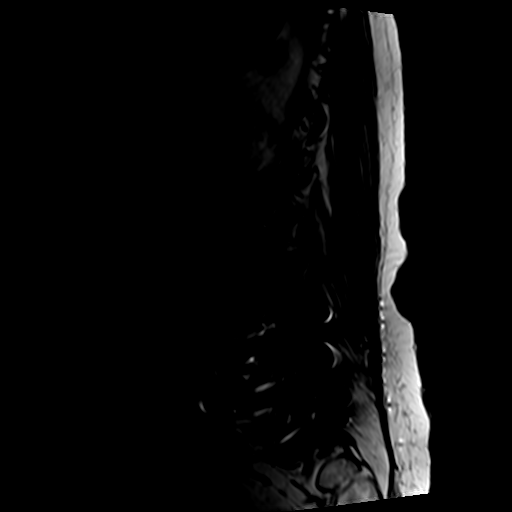
[im 11/22]
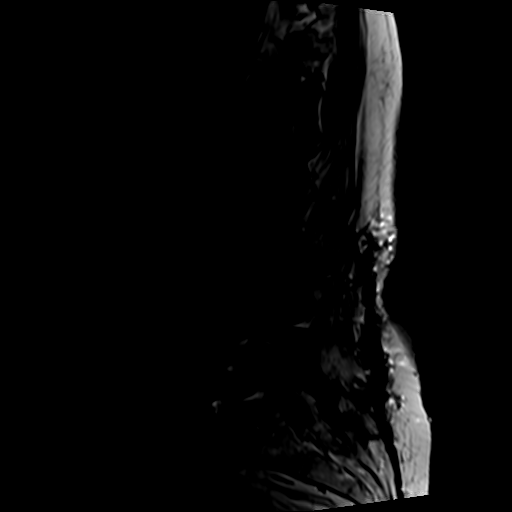
[im 15/22]
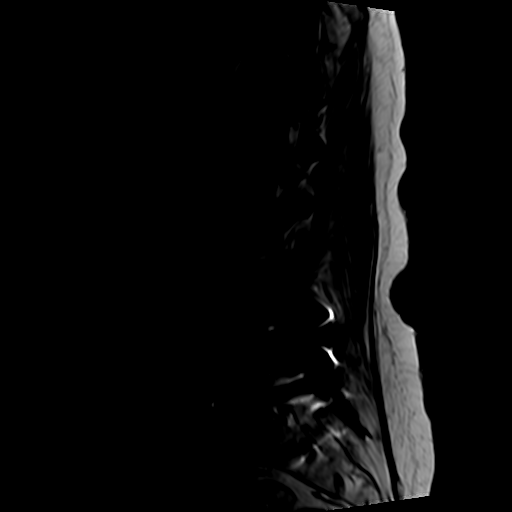
[im 18/22]
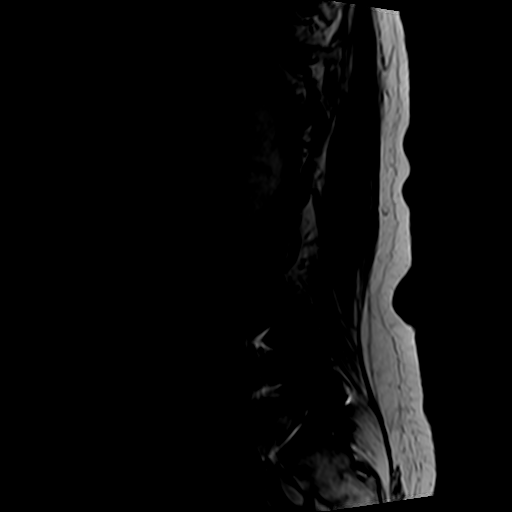
[im 22/22]
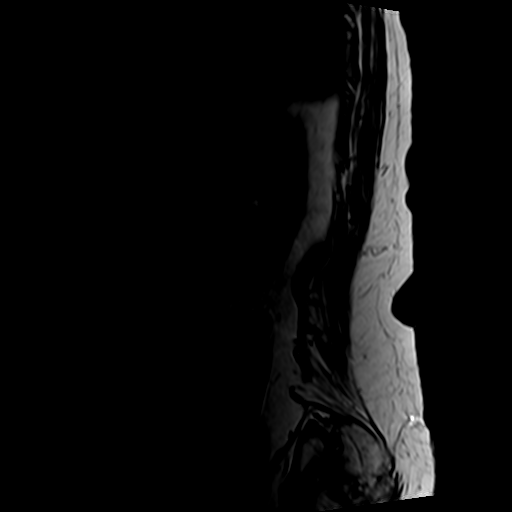

[Series 6: T2 · axial · 4.0mm · 0.78mm/px · z∈[-116,+115]mm · 8 of 45 slices shown (2 of 2)]
[im 1/45]
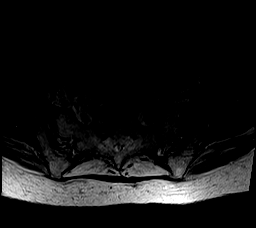
[im 7/45]
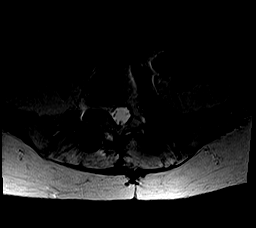
[im 14/45]
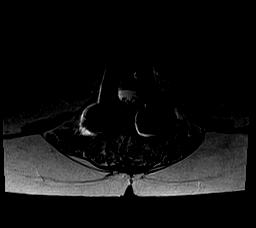
[im 21/45]
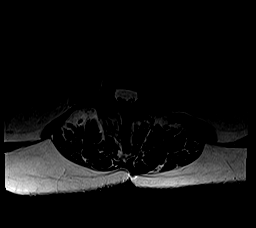
[im 24/45]
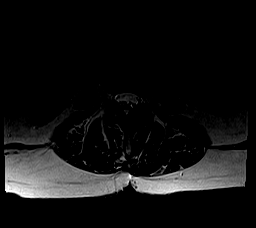
[im 31/45]
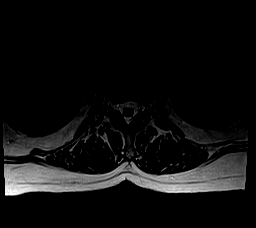
[im 38/45]
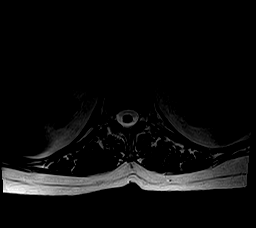
[im 45/45]
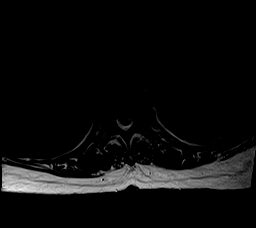

[Series 7: T1 · axial · 4.0mm · 0.39mm/px · z∈[-85,+78]mm · 3 of 45 slices shown (2 of 2)]
[im 7/45]
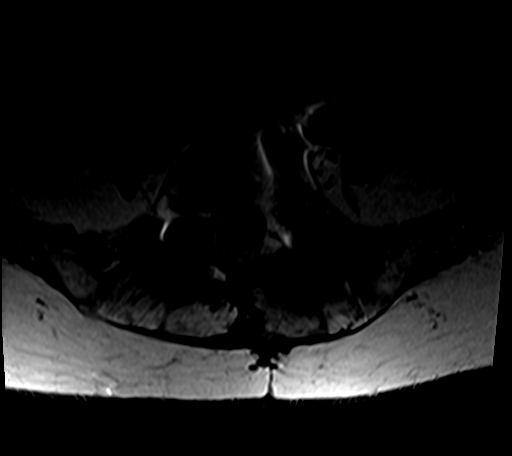
[im 24/45]
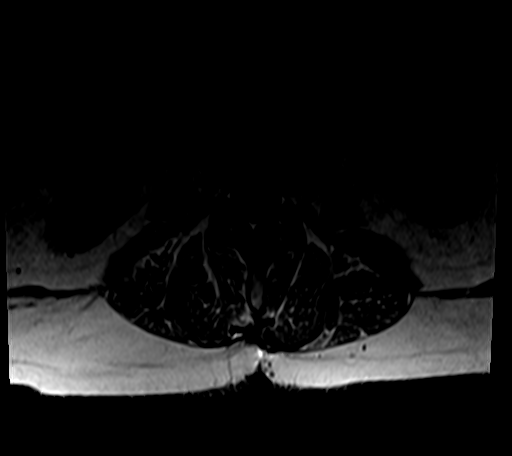
[im 38/45]
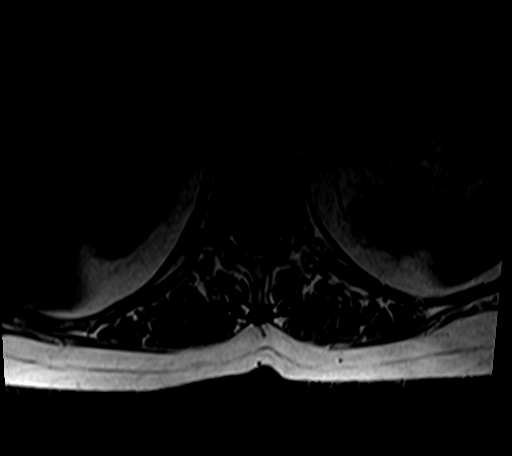

[24 of 48 positions shown; findings below may reference images not displayed]

FINDINGS: Segmentation:  Standard.

Alignment:  Physiologic.

Vertebrae: Prior posterior and interbody fusion from L4 through S1.
There are multiple new T1 hypointense/T2 hyperintense lesions seen
in the T10, L1, L2, and L3 vertebral bodies (sagittal T1 image 8,
11, 12, and 14). There is involvement of the posterior elements on
the left at L3 on right at T10.

Conus medullaris and cauda equina: Conus extends to the L2 level.
Conus and cauda equina appear normal.

Paraspinal and other soft tissues: Unchanged right upper pole renal
cyst. There are additional tiny cysts bilaterally in the kidneys.

Disc levels:

T12-L1: Minimal disc bulging. No significant spinal canal or neural
foraminal stenosis.

L1-L2: Shallow right paracentral disc protrusion. No significant
spinal canal or neural foraminal stenosis.

L2-L3: No significant spinal canal or neural foraminal narrowing.

L3-L4: Trace retrolisthesis. Ligamentum flavum hypertrophy and
bilateral facet arthropathy. No significant spinal canal stenosis or
neural foraminal stenosis.

L4-L5: Prior posterior and interbody fusion. Patent spinal canal.
Bony spurring on the left results in mild left neural foraminal
stenosis.

L5-S1: Prior posterior and interbody fusion. Patent spinal canal.
Neural foramina appear patent within the limitations of significant
susceptibility artifact.
IMPRESSION: Multiple new T1 hypointense bone lesions seen in the T10, L1, L2,
and L3 vertebral bodies with involvement of posterior elements on
the left at L3 and on the right at T10. This is highly concerning
for malignancy. Recommend malignancy workup including mammography
and likely a CT of the chest, abdomen, and pelvis.

Prior posterior and interbody fusion from L4-S1. Patent spinal canal
at these levels. Bony spurring results in mild left-sided neural
foraminal narrowing at L4-L5.

These results were called by telephone at the time of interpretation
on 05/05/2021 at [DATE] to provider Damm, Donta, who
verbally acknowledged these results.
